# Patient Record
Sex: Female | Born: 1945
Health system: Southern US, Community
[De-identification: ages and names within clinical notes are randomized; demographics above are authoritative.]

## PROBLEM LIST (undated history)

## (undated) DIAGNOSIS — M8580 Other specified disorders of bone density and structure, unspecified site: Secondary | ICD-10-CM

## (undated) DIAGNOSIS — I2510 Atherosclerotic heart disease of native coronary artery without angina pectoris: Secondary | ICD-10-CM

## (undated) DIAGNOSIS — I10 Essential (primary) hypertension: Secondary | ICD-10-CM

## (undated) DIAGNOSIS — E785 Hyperlipidemia, unspecified: Secondary | ICD-10-CM

## (undated) DIAGNOSIS — IMO0001 Reserved for inherently not codable concepts without codable children: Secondary | ICD-10-CM

## (undated) DIAGNOSIS — R896 Abnormal cytological findings in specimens from other organs, systems and tissues: Secondary | ICD-10-CM

## (undated) DIAGNOSIS — IMO0002 Reserved for concepts with insufficient information to code with codable children: Secondary | ICD-10-CM

## (undated) DIAGNOSIS — I219 Acute myocardial infarction, unspecified: Secondary | ICD-10-CM

## (undated) DIAGNOSIS — Q600 Renal agenesis, unilateral: Secondary | ICD-10-CM

## (undated) DIAGNOSIS — T7840XA Allergy, unspecified, initial encounter: Secondary | ICD-10-CM

## (undated) HISTORY — DX: Reserved for concepts with insufficient information to code with codable children: IMO0002

## (undated) HISTORY — DX: Renal agenesis, unilateral: Q60.0

## (undated) HISTORY — PX: OTHER SURGICAL HISTORY: SHX169

## (undated) HISTORY — DX: Essential (primary) hypertension: I10

## (undated) HISTORY — PX: CORONARY ANGIOPLASTY: SHX604

## (undated) HISTORY — DX: Reserved for inherently not codable concepts without codable children: IMO0001

## (undated) HISTORY — DX: Allergy, unspecified, initial encounter: T78.40XA

## (undated) HISTORY — DX: Acute myocardial infarction, unspecified: I21.9

## (undated) HISTORY — PX: CARDIAC CATHETERIZATION: SHX172

## (undated) HISTORY — DX: Abnormal cytological findings in specimens from other organs, systems and tissues: R89.6

## (undated) HISTORY — DX: Other specified disorders of bone density and structure, unspecified site: M85.80

## (undated) HISTORY — DX: Hyperlipidemia, unspecified: E78.5

---

## 1999-01-12 ENCOUNTER — Other Ambulatory Visit: Admission: RE | Admit: 1999-01-12 | Discharge: 1999-01-12 | Payer: Self-pay | Admitting: Obstetrics and Gynecology

## 2000-05-27 HISTORY — PX: CORONARY ANGIOPLASTY WITH STENT PLACEMENT: SHX49

## 2000-05-27 HISTORY — PX: OTHER SURGICAL HISTORY: SHX169

## 2000-12-04 ENCOUNTER — Encounter: Payer: Self-pay | Admitting: Emergency Medicine

## 2000-12-04 ENCOUNTER — Inpatient Hospital Stay (HOSPITAL_COMMUNITY): Admission: AD | Admit: 2000-12-04 | Discharge: 2000-12-07 | Payer: Self-pay | Admitting: Cardiology

## 2000-12-04 ENCOUNTER — Encounter: Admission: RE | Admit: 2000-12-04 | Discharge: 2000-12-05 | Payer: Self-pay | Admitting: Cardiology

## 2001-01-14 ENCOUNTER — Other Ambulatory Visit: Admission: RE | Admit: 2001-01-14 | Discharge: 2001-01-14 | Payer: Self-pay | Admitting: Obstetrics and Gynecology

## 2002-04-29 ENCOUNTER — Other Ambulatory Visit: Admission: RE | Admit: 2002-04-29 | Discharge: 2002-04-29 | Payer: Self-pay | Admitting: Obstetrics and Gynecology

## 2004-06-05 ENCOUNTER — Ambulatory Visit: Payer: Self-pay | Admitting: *Deleted

## 2004-08-01 ENCOUNTER — Ambulatory Visit: Payer: Self-pay | Admitting: *Deleted

## 2005-01-07 ENCOUNTER — Ambulatory Visit: Payer: Self-pay | Admitting: Internal Medicine

## 2005-01-15 ENCOUNTER — Ambulatory Visit: Payer: Self-pay | Admitting: Internal Medicine

## 2005-01-21 ENCOUNTER — Ambulatory Visit: Payer: Self-pay | Admitting: Internal Medicine

## 2005-02-01 ENCOUNTER — Encounter: Payer: Self-pay | Admitting: Internal Medicine

## 2005-02-01 ENCOUNTER — Ambulatory Visit: Payer: Self-pay

## 2005-02-13 ENCOUNTER — Ambulatory Visit: Payer: Self-pay | Admitting: Internal Medicine

## 2005-02-25 ENCOUNTER — Ambulatory Visit: Payer: Self-pay | Admitting: Internal Medicine

## 2005-03-07 ENCOUNTER — Ambulatory Visit: Payer: Self-pay | Admitting: Internal Medicine

## 2005-03-20 ENCOUNTER — Encounter (INDEPENDENT_AMBULATORY_CARE_PROVIDER_SITE_OTHER): Payer: Self-pay | Admitting: Specialist

## 2005-03-20 ENCOUNTER — Ambulatory Visit: Payer: Self-pay | Admitting: Internal Medicine

## 2005-03-28 ENCOUNTER — Ambulatory Visit: Payer: Self-pay | Admitting: Internal Medicine

## 2005-06-13 ENCOUNTER — Ambulatory Visit: Payer: Self-pay | Admitting: Internal Medicine

## 2005-10-30 ENCOUNTER — Ambulatory Visit: Payer: Self-pay | Admitting: Internal Medicine

## 2006-03-25 ENCOUNTER — Ambulatory Visit: Payer: Self-pay | Admitting: Internal Medicine

## 2006-03-25 LAB — CONVERTED CEMR LAB
BUN: 13 mg/dL (ref 6–23)
CO2: 31 meq/L (ref 19–32)
Calcium: 9.5 mg/dL (ref 8.4–10.5)
Chloride: 103 meq/L (ref 96–112)
Creatinine, Ser: 0.9 mg/dL (ref 0.4–1.2)
GFR calc non Af Amer: 68 mL/min
Glomerular Filtration Rate, Af Am: 82 mL/min/{1.73_m2}
Glucose, Bld: 121 mg/dL — ABNORMAL HIGH (ref 70–99)
Hgb A1c MFr Bld: 6.3 % — ABNORMAL HIGH (ref 4.6–6.0)
Potassium: 4.2 meq/L (ref 3.5–5.1)
Sodium: 141 meq/L (ref 135–145)

## 2006-03-27 ENCOUNTER — Ambulatory Visit: Payer: Self-pay | Admitting: Internal Medicine

## 2007-04-15 DIAGNOSIS — E785 Hyperlipidemia, unspecified: Secondary | ICD-10-CM | POA: Insufficient documentation

## 2007-04-15 DIAGNOSIS — Z87891 Personal history of nicotine dependence: Secondary | ICD-10-CM | POA: Insufficient documentation

## 2007-04-15 DIAGNOSIS — R7309 Other abnormal glucose: Secondary | ICD-10-CM | POA: Insufficient documentation

## 2007-04-15 DIAGNOSIS — I2510 Atherosclerotic heart disease of native coronary artery without angina pectoris: Secondary | ICD-10-CM | POA: Insufficient documentation

## 2007-04-15 DIAGNOSIS — I10 Essential (primary) hypertension: Secondary | ICD-10-CM | POA: Insufficient documentation

## 2007-05-20 ENCOUNTER — Encounter: Payer: Self-pay | Admitting: Internal Medicine

## 2007-06-05 ENCOUNTER — Telehealth: Payer: Self-pay | Admitting: Internal Medicine

## 2007-06-17 ENCOUNTER — Telehealth: Payer: Self-pay | Admitting: Internal Medicine

## 2007-07-14 ENCOUNTER — Ambulatory Visit: Payer: Self-pay | Admitting: Internal Medicine

## 2007-07-14 DIAGNOSIS — Q602 Renal agenesis, unspecified: Secondary | ICD-10-CM | POA: Insufficient documentation

## 2007-07-14 DIAGNOSIS — Q605 Renal hypoplasia, unspecified: Secondary | ICD-10-CM

## 2007-07-14 DIAGNOSIS — I252 Old myocardial infarction: Secondary | ICD-10-CM | POA: Insufficient documentation

## 2007-07-15 ENCOUNTER — Telehealth: Payer: Self-pay | Admitting: Internal Medicine

## 2007-07-16 ENCOUNTER — Encounter: Payer: Self-pay | Admitting: Internal Medicine

## 2007-09-01 ENCOUNTER — Ambulatory Visit: Payer: Self-pay | Admitting: Internal Medicine

## 2007-09-01 DIAGNOSIS — M899 Disorder of bone, unspecified: Secondary | ICD-10-CM | POA: Insufficient documentation

## 2007-09-01 DIAGNOSIS — M949 Disorder of cartilage, unspecified: Secondary | ICD-10-CM

## 2007-09-01 LAB — CONVERTED CEMR LAB: Vit D, 1,25-Dihydroxy: 36 (ref 30–89)

## 2007-09-05 LAB — CONVERTED CEMR LAB
BUN: 16 mg/dL (ref 6–23)
CO2: 28 meq/L (ref 19–32)
Calcium: 9.3 mg/dL (ref 8.4–10.5)
Chloride: 106 meq/L (ref 96–112)
Cholesterol: 175 mg/dL (ref 0–200)
Creatinine, Ser: 0.8 mg/dL (ref 0.4–1.2)
GFR calc Af Amer: 94 mL/min
GFR calc non Af Amer: 78 mL/min
Glucose, Bld: 122 mg/dL — ABNORMAL HIGH (ref 70–99)
HDL: 28.3 mg/dL — ABNORMAL LOW (ref >39.0)
Hgb A1c MFr Bld: 7 % — ABNORMAL HIGH (ref 4.6–6.0)
LDL Cholesterol: 123 mg/dL — ABNORMAL HIGH (ref 0–99)
Potassium: 4.4 meq/L (ref 3.5–5.1)
Sodium: 141 meq/L (ref 135–145)
TSH: 2.84 microintl units/mL (ref 0.35–5.50)
Total CHOL/HDL Ratio: 6.2
Triglycerides: 121 mg/dL (ref 0–149)
VLDL: 24 mg/dL (ref 0–40)

## 2007-09-14 ENCOUNTER — Encounter: Payer: Self-pay | Admitting: Internal Medicine

## 2007-09-14 ENCOUNTER — Other Ambulatory Visit: Admission: RE | Admit: 2007-09-14 | Discharge: 2007-09-14 | Payer: Self-pay | Admitting: Internal Medicine

## 2007-09-14 ENCOUNTER — Ambulatory Visit: Payer: Self-pay | Admitting: Internal Medicine

## 2007-09-14 LAB — CONVERTED CEMR LAB
Cholesterol, target level: 200 mg/dL
HDL goal, serum: 40 mg/dL
LDL Goal: 100 mg/dL

## 2007-12-03 ENCOUNTER — Ambulatory Visit: Payer: Self-pay | Admitting: Internal Medicine

## 2007-12-03 LAB — CONVERTED CEMR LAB
ALT: 65 units/L — ABNORMAL HIGH (ref 0–35)
AST: 51 units/L — ABNORMAL HIGH (ref 0–37)
Albumin: 3.9 g/dL (ref 3.5–5.2)
Alkaline Phosphatase: 68 units/L (ref 39–117)
Bilirubin, Direct: 0.1 mg/dL (ref 0.0–0.3)
Cholesterol: 166 mg/dL (ref 0–200)
HDL: 31.8 mg/dL — ABNORMAL LOW (ref >39.0)
Hgb A1c MFr Bld: 6.5 % — ABNORMAL HIGH (ref 4.6–6.0)
LDL Cholesterol: 107 mg/dL — ABNORMAL HIGH (ref 0–99)
Total Bilirubin: 1.2 mg/dL (ref 0.3–1.2)
Total CHOL/HDL Ratio: 5.2
Total Protein: 7 g/dL (ref 6.0–8.3)
Triglycerides: 138 mg/dL (ref 0–149)
VLDL: 28 mg/dL (ref 0–40)

## 2007-12-14 ENCOUNTER — Ambulatory Visit: Payer: Self-pay | Admitting: Internal Medicine

## 2007-12-22 ENCOUNTER — Telehealth: Payer: Self-pay | Admitting: Internal Medicine

## 2007-12-23 ENCOUNTER — Ambulatory Visit (HOSPITAL_COMMUNITY): Admission: RE | Admit: 2007-12-23 | Discharge: 2007-12-23 | Payer: Self-pay | Admitting: Internal Medicine

## 2008-02-05 ENCOUNTER — Telehealth: Payer: Self-pay | Admitting: Internal Medicine

## 2008-02-05 ENCOUNTER — Ambulatory Visit: Payer: Self-pay | Admitting: Internal Medicine

## 2008-02-05 LAB — CONVERTED CEMR LAB
ALT: 51 units/L — ABNORMAL HIGH (ref 0–35)
AST: 33 units/L (ref 0–37)
Albumin: 3.9 g/dL (ref 3.5–5.2)
Alkaline Phosphatase: 64 units/L (ref 39–117)
Bilirubin, Direct: 0.1 mg/dL (ref 0.0–0.3)
Cholesterol: 141 mg/dL (ref 0–200)
HDL: 34.2 mg/dL — ABNORMAL LOW (ref >39.0)
LDL Cholesterol: 80 mg/dL (ref 0–99)
Total Bilirubin: 0.8 mg/dL (ref 0.3–1.2)
Total CHOL/HDL Ratio: 4.1
Total Protein: 7.4 g/dL (ref 6.0–8.3)
Triglycerides: 136 mg/dL (ref 0–149)
VLDL: 27 mg/dL (ref 0–40)

## 2008-02-16 ENCOUNTER — Ambulatory Visit: Payer: Self-pay | Admitting: Internal Medicine

## 2008-02-16 LAB — CONVERTED CEMR LAB: Blood Glucose, Fingerstick: 113

## 2008-07-19 ENCOUNTER — Encounter: Payer: Self-pay | Admitting: Internal Medicine

## 2008-08-15 ENCOUNTER — Ambulatory Visit: Payer: Self-pay | Admitting: Internal Medicine

## 2008-08-15 LAB — CONVERTED CEMR LAB
AST: 29 units/L (ref 0–37)
Hgb A1c MFr Bld: 6.2 % (ref 4.6–6.5)
Total Bilirubin: 1.2 mg/dL (ref 0.3–1.2)

## 2008-08-23 ENCOUNTER — Ambulatory Visit: Payer: Self-pay | Admitting: Internal Medicine

## 2008-08-23 DIAGNOSIS — E669 Obesity, unspecified: Secondary | ICD-10-CM | POA: Insufficient documentation

## 2008-08-23 LAB — CONVERTED CEMR LAB: Blood Glucose, Fingerstick: 110

## 2008-11-14 ENCOUNTER — Ambulatory Visit: Payer: Self-pay | Admitting: Internal Medicine

## 2008-11-15 ENCOUNTER — Telehealth: Payer: Self-pay | Admitting: Internal Medicine

## 2008-11-15 LAB — CONVERTED CEMR LAB
ALT: 18 units/L (ref 0–35)
AST: 21 units/L (ref 0–37)
Alkaline Phosphatase: 75 units/L (ref 39–117)
BUN: 23 mg/dL (ref 6–23)
Bilirubin, Direct: 0.1 mg/dL (ref 0.0–0.3)
Chloride: 103 meq/L (ref 96–112)
Hgb A1c MFr Bld: 6 % (ref 4.6–6.5)
Potassium: 4.8 meq/L (ref 3.5–5.1)
TSH: 1.39 microintl units/mL (ref 0.35–5.50)
Total Bilirubin: 1.2 mg/dL (ref 0.3–1.2)

## 2008-12-13 ENCOUNTER — Ambulatory Visit: Payer: Self-pay | Admitting: Internal Medicine

## 2008-12-13 DIAGNOSIS — M109 Gout, unspecified: Secondary | ICD-10-CM | POA: Insufficient documentation

## 2008-12-13 LAB — CONVERTED CEMR LAB: Blood Glucose, Fingerstick: 159

## 2009-01-31 ENCOUNTER — Telehealth: Payer: Self-pay | Admitting: *Deleted

## 2009-03-08 ENCOUNTER — Ambulatory Visit: Payer: Self-pay | Admitting: Internal Medicine

## 2009-03-08 LAB — CONVERTED CEMR LAB
ALT: 16 units/L (ref 0–35)
AST: 20 units/L (ref 0–37)
Albumin: 3.9 g/dL (ref 3.5–5.2)
BUN: 11 mg/dL (ref 6–23)
Bilirubin Urine: NEGATIVE
Chloride: 101 meq/L (ref 96–112)
Cholesterol: 153 mg/dL (ref 0–200)
Eosinophils Relative: 3.7 % (ref 0.0–5.0)
Glucose, Bld: 93 mg/dL (ref 70–99)
Glucose, Urine, Semiquant: NEGATIVE
HCT: 51.1 % — ABNORMAL HIGH (ref 36.0–46.0)
Hemoglobin: 16.8 g/dL — ABNORMAL HIGH (ref 12.0–15.0)
Lymphs Abs: 3 10*3/uL (ref 0.7–4.0)
MCV: 89.1 fL (ref 78.0–100.0)
Monocytes Absolute: 0.6 10*3/uL (ref 0.1–1.0)
Neutro Abs: 3.4 10*3/uL (ref 1.4–7.7)
Platelets: 235 10*3/uL (ref 150.0–400.0)
Potassium: 4.6 meq/L (ref 3.5–5.1)
Sodium: 144 meq/L (ref 135–145)
TSH: 2.63 microintl units/mL (ref 0.35–5.50)
Total Bilirubin: 0.9 mg/dL (ref 0.3–1.2)
Total Protein: 7.2 g/dL (ref 6.0–8.3)
WBC: 7.4 10*3/uL (ref 4.5–10.5)
pH: 5

## 2009-03-15 ENCOUNTER — Ambulatory Visit: Payer: Self-pay | Admitting: Internal Medicine

## 2009-03-15 ENCOUNTER — Other Ambulatory Visit: Admission: RE | Admit: 2009-03-15 | Discharge: 2009-03-15 | Payer: Self-pay | Admitting: Internal Medicine

## 2009-03-15 ENCOUNTER — Encounter: Payer: Self-pay | Admitting: Internal Medicine

## 2009-03-15 DIAGNOSIS — R7989 Other specified abnormal findings of blood chemistry: Secondary | ICD-10-CM | POA: Insufficient documentation

## 2009-05-18 ENCOUNTER — Telehealth: Payer: Self-pay | Admitting: Internal Medicine

## 2009-05-18 ENCOUNTER — Ambulatory Visit: Payer: Self-pay | Admitting: Internal Medicine

## 2009-05-18 DIAGNOSIS — J069 Acute upper respiratory infection, unspecified: Secondary | ICD-10-CM | POA: Insufficient documentation

## 2009-05-27 HISTORY — PX: HEMORRHOID SURGERY: SHX153

## 2009-06-23 ENCOUNTER — Encounter: Payer: Self-pay | Admitting: Internal Medicine

## 2009-08-30 ENCOUNTER — Encounter: Payer: Self-pay | Admitting: Internal Medicine

## 2009-09-28 ENCOUNTER — Ambulatory Visit: Payer: Self-pay | Admitting: Internal Medicine

## 2009-09-28 LAB — CONVERTED CEMR LAB
ALT: 12 units/L (ref 0–35)
AST: 16 units/L (ref 0–37)
Albumin: 3.9 g/dL (ref 3.5–5.2)
Alkaline Phosphatase: 84 units/L (ref 39–117)
Bilirubin, Direct: 0.1 mg/dL (ref 0.0–0.3)
Cholesterol: 134 mg/dL (ref 0–200)
Total Bilirubin: 0.6 mg/dL (ref 0.3–1.2)
VLDL: 20.4 mg/dL (ref 0.0–40.0)

## 2009-10-03 ENCOUNTER — Ambulatory Visit: Payer: Self-pay | Admitting: Internal Medicine

## 2009-11-06 ENCOUNTER — Encounter: Payer: Self-pay | Admitting: Internal Medicine

## 2010-01-19 ENCOUNTER — Encounter: Payer: Self-pay | Admitting: Internal Medicine

## 2010-02-21 ENCOUNTER — Encounter: Payer: Self-pay | Admitting: Internal Medicine

## 2010-03-13 ENCOUNTER — Ambulatory Visit: Payer: Self-pay | Admitting: Internal Medicine

## 2010-03-13 LAB — CONVERTED CEMR LAB
ALT: 13 units/L (ref 0–35)
AST: 17 units/L (ref 0–37)
BUN: 20 mg/dL (ref 6–23)
Basophils Relative: 0.6 % (ref 0.0–3.0)
Bilirubin Urine: NEGATIVE
Bilirubin, Direct: 0.1 mg/dL (ref 0.0–0.3)
Chloride: 106 meq/L (ref 96–112)
Eosinophils Relative: 2.4 % (ref 0.0–5.0)
GFR calc non Af Amer: 97.71 mL/min (ref >60)
HCT: 45.3 % (ref 36.0–46.0)
Ketones, ur: NEGATIVE mg/dL
LDL Cholesterol: 60 mg/dL (ref 0–99)
Lymphs Abs: 3.3 10*3/uL (ref 0.7–4.0)
MCV: 89.9 fL (ref 78.0–100.0)
Monocytes Absolute: 0.6 10*3/uL (ref 0.1–1.0)
Monocytes Relative: 9.1 % (ref 3.0–12.0)
Platelets: 238 10*3/uL (ref 150.0–400.0)
Potassium: 5.5 meq/L — ABNORMAL HIGH (ref 3.5–5.1)
RBC: 5.04 M/uL (ref 3.87–5.11)
Sodium: 145 meq/L (ref 135–145)
Total Bilirubin: 1 mg/dL (ref 0.3–1.2)
Total CHOL/HDL Ratio: 3
Total Protein, Urine: NEGATIVE mg/dL
Total Protein: 7.2 g/dL (ref 6.0–8.3)
VLDL: 19.2 mg/dL (ref 0.0–40.0)
WBC: 7.2 10*3/uL (ref 4.5–10.5)
pH: 5.5 (ref 5.0–8.0)

## 2010-03-20 ENCOUNTER — Ambulatory Visit: Payer: Self-pay | Admitting: Internal Medicine

## 2010-05-08 ENCOUNTER — Telehealth: Payer: Self-pay | Admitting: *Deleted

## 2010-05-11 ENCOUNTER — Ambulatory Visit: Payer: Self-pay | Admitting: Internal Medicine

## 2010-05-11 DIAGNOSIS — J209 Acute bronchitis, unspecified: Secondary | ICD-10-CM | POA: Insufficient documentation

## 2010-05-16 ENCOUNTER — Telehealth: Payer: Self-pay | Admitting: *Deleted

## 2010-06-06 ENCOUNTER — Telehealth: Payer: Self-pay | Admitting: *Deleted

## 2010-06-17 ENCOUNTER — Encounter: Payer: Self-pay | Admitting: Internal Medicine

## 2010-06-26 NOTE — Assessment & Plan Note (Signed)
Summary: follow up on labs/ssc/pt rsc/cjr   Vital Signs:  Patient profile:   65 year old female Menstrual status:  postmenopausal Weight:      155 pounds Pulse rate:   72 / minute BP sitting:   120 / 60  (right arm) Cuff size:   regular  Vitals Entered By: Sherron Monday, CMA (AAMA) (Oct 03, 2009 8:43 AM) CC: Follow-up visit on labs, Hypertension Management   History of Present Illness: Rebecca Mcintyre comesin comes in today  for above  BP:  no problems with meds  BG:  lifestyle intervention and metformin and no se of meds  LIPIDS:No se of crestor  CAD:  no cp sob   OBGYNE    : Folowing  Hemorrhoid   :  Had surgery  and better  "I feel pretty good"   Hypertension History:      She denies headache, chest pain, palpitations, dyspnea with exertion, orthopnea, PND, peripheral edema, visual symptoms, neurologic problems, syncope, and side effects from treatment.  She notes no problems with any antihypertensive medication side effects.        Positive major cardiovascular risk factors include female age 57 years old or older, hyperlipidemia, and hypertension.  Negative major cardiovascular risk factors include non-tobacco-user status.        Positive history for target organ damage include ASHD (either angina/prior MI/prior CABG).  Further assessment for target organ damage reveals no history of stroke/TIA.     Preventive Screening-Counseling & Management  Alcohol-Tobacco     Alcohol drinks/day: <1     Alcohol type: wine     Smoking Status: quit > 6 months     Year Quit: 2002  Caffeine-Diet-Exercise     Caffeine use/day: 0     Does Patient Exercise: yes     Type of exercise: walking, cardio, strenth     Times/week: 6  Current Medications (verified): 1)  Adprin B 325 Mg  Tabs (Aspirin Buf(Cacarb-Mgcarb-Mgo)) 2)  Nitroquick 0.4 Mg  Subl (Nitroglycerin) .... Sl As Needed 3)  Metoprolol Tartrate 25 Mg  Tabs (Metoprolol Tartrate) .... 1/2  By Mouth Two Times A Day 4)  Vitamin D  400 Unit  Tabs (Cholecalciferol) 5)  Multivitamins   Tabs (Multiple Vitamin) 6)  Fish Oil   Oil (Fish Oil) 7)  Metformin Hcl 500 Mg  Tabs (Metformin Hcl) .[name].. 1 By Mouth Two Times A Day 8)  Crestor 20 Mg Tabs (Rosuvastatin Calcium) .[name].. 1 By Mouth Once Daily 9)  Hydrocodone-Homatropine 5-1.5 Mg/75m Syrp (Hydrocodone-Homatropine) ..[name]. 1 Teaspoon Every 6 Hours As Needed For Cough  Allergies (verified): 1)  Lipitor  Past History:  Past medical, surgical, family and social histories (including risk factors) reviewed, and no changes noted (except as noted below).  Past Medical History: Myocardial infarction anterior stemi , hx of  2002   stent" in front artery of her heart."95% LAD with intervention Last  stress test 2006   nl lv function hypertensive response  subendocardial scar  Hypertension Hyperlipidemia Gout Osteopenia Solitary kidney   Past Surgical History: Right oophorectomy with salpingectomy benign growth Dr RJoneen Caraway LAD Stent   angioplasty 2002 Hemorrhoidectomy 2011  Past History:  Care Management: GYNE: Cards :  Pulsipher in the past  SURGEON  Family History: Reviewed history from 02/16/2008 and no changes required. Family History Breast cancer 1st degree relative <50-mother 433   Family History of Cardiovascular disorder- Brother Heart Attack 456died  twin  had MI  recently  ?  elevated BG niece 55 died of breast cancer.   Family History Lung cancer father 79  smoker      Social History: Reviewed history from 02/16/2008 and no changes required. Married Former Smoker  37 years stopped at MI Alcohol use-yes 1-2 per month  Drug use-no Regular exercise-yes 6-7 hours sleep.  Divorced Occupation:realtor        Review of Systems  The patient denies anorexia, fever, weight loss, weight gain, vision loss, hoarseness, chest pain, syncope, dyspnea on exertion, peripheral edema, abdominal pain, melena, hematochezia, severe indigestion/heartburn, muscle weakness,  suspicious skin lesions, transient blindness, difficulty walking, depression, abnormal bleeding, enlarged lymph nodes, and angioedema.         has a cough   after working in yard  no cp sob or wheeze   Physical Exam  General:  Well-developed,well-nourished,in no acute distress; alert,appropriate and cooperative throughout examination Head:  normocephalic and atraumatic.   Eyes:  vision grossly intact, pupils equal, and pupils round.   Ears:  R ear normal and L ear normal.   Mouth:  pharynx pink and moist.   Neck:  No deformities, masses, or tenderness noted.thyroid palpable Lungs:  Normal respiratory effort, chest expands symmetrically. Lungs are clear to auscultation, no crackles or wheezes. Heart:  Normal rate and regular rhythm. S1 and S2 normal without gallop, murmur, click, rub or other extra sounds. Abdomen:  Bowel sounds positive,abdomen soft and non-tender without masses, organomegaly or  noted. Msk:  no joint warmth and no redness over joints.   Pulses:  pulses intact without delay   Extremities:  trace left pedal edema and trace right pedal edema.   no clubbing cyanosis  Neurologic:  alert & oriented X3, strength normal in all extremities, and gait normal.   Skin:  turgor normal and color normal.  peeling from sun on back no unusal lesions Cervical Nodes:  No lymphadenopathy noted Psych:  Normal eye contact, appropriate affect. Cognition appears normal.    Impression & Recommendations:  Problem # 1:  HYPERTENSION (ICD-401.9) Assessment Unchanged  Her updated medication list for this problem includes:    Metoprolol Tartrate 25 Mg Tabs (Metoprolol tartrate) .[name]... 1/2  by mouth two times a day  BP today: 120/60 Prior BP: 116/72 (05/18/2009)  Prior 10 Yr Risk Heart Disease: N/A (07/14/2007)  Labs Reviewed: K+: 4.6 (03/08/2009) Creat: : 0.7 (03/08/2009)   Chol: 134 (09/28/2009)   HDL: 47.40 (09/28/2009)   LDL: 66 (09/28/2009)   TG: 102.0 (09/28/2009)  Problem # 2:   HYPERLIPIDEMIA (ICD-272.4)  Her updated medication list for this problem includes:    Crestor 20 Mg Tabs (Rosuvastatin calcium) .[name]... 1 by mouth once daily  Labs Reviewed: SGOT: 16 (09/28/2009)   SGPT: 12 (09/28/2009)  Lipid Goals: Chol Goal: 200 (09/14/2007)   HDL Goal: 40 (09/14/2007)   LDL Goal: 100 (09/14/2007)   TG Goal: 150 (09/14/2007)  Prior 10 Yr Risk Heart Disease: N/A (07/14/2007)   HDL:47.40 (09/28/2009), 34.80 (03/08/2009)  LDL:66 (09/28/2009), 91 (03/08/2009)  Chol:134 (09/28/2009), 153 (03/08/2009)  Trig:102.0 (09/28/2009), 137.0 (03/08/2009)  Problem # 3:  OTHER ABNORMAL GLUCOSE (ICD-790.29) Assessment: Improved  Her updated medication list for this problem includes:    Metformin Hcl 500 Mg Tabs (Metformin hcl) .[name]... 1 by mouth two times a day  Labs Reviewed: Creat: 0.7 (03/08/2009)     Problem # 4:  CORONARY ARTERY DISEASE (ICD-414.00) quiescent.     Problem # 5:  OBESITY (ICD-278.00) has lost weight with lifestyle intervention and  now [facility]  is 73!!!   to continue .   Complete Medication List: 1)  Adprin B 325 Mg Tabs (Aspirin buf(cacarb-mgcarb-mgo)) 2)  Nitroquick 0.4 Mg Subl (Nitroglycerin) .... Sl as needed 3)  Metoprolol Tartrate 25 Mg Tabs (Metoprolol tartrate) .... 1/2  by mouth two times a day 4)  Vitamin D 400 Unit Tabs (Cholecalciferol) 5)  Multivitamins Tabs (Multiple vitamin) 6)  Fish Oil Oil (Fish oil) 7)  Metformin Hcl 500 Mg Tabs (Metformin hcl) .[name].. 1 by mouth two times a day 8)  Crestor 20 Mg Tabs (Rosuvastatin calcium) .[name].. 1 by mouth once daily  Hypertension Assessment/Plan:      The patient's hypertensive risk group is category C: Target organ damage and/or diabetes.  Today's blood pressure is 120/60.  Her blood pressure goal is < 140/90.  Patient Instructions: 1)  ontinue  healthy lifestyle . 2)  CPX  in the fall with labs and  Hg a1c .   dx 790.91 Prescriptions: CRESTOR 20 MG TABS (ROSUVASTATIN CALCIUM) 1 by mouth once daily  #30 x  6   Entered and Authorized by:   Burnis Medin MD   Signed by:   Burnis Medin MD on 10/03/2009   Method used:   Electronically to        [facility] [address] (retail)       [place], [place]  [address]       Ph: [phone]       Fax: [phone]   RxID:   [id number] METFORMIN HCL 500 MG  TABS (METFORMIN HCL) 1 by mouth two times a day  #60 Not Speci x 12   Entered and Authorized by:   Burnis Medin MD   Signed by:   Burnis Medin MD on 10/03/2009   Method used:   Electronically to        [facility] [id number] (retail)       [place], [place]  [address]       Ph: [phone]       Fax: [phone]   RxID:   [id number] METOPROLOL TARTRATE 25 MG  TABS (METOPROLOL TARTRATE) 1/2  by mouth two times a day  #90 Tablet x 12   Entered and Authorized by:   Burnis Medin MD   Signed by:   Burnis Medin MD on 10/03/2009   Method used:   Electronically to        [facility] [address] (retail)       [place], [place]  [address]       Ph: [phone]       Fax: [phone]   RxID:   [id number]  review of record  last  stress test  2006 .   will address at follow up

## 2010-06-26 NOTE — Letter (Signed)
Summary: Letter from patient  Letter from patient   Imported By: Bubba Hales 03/05/2010 08:02:04  _____________________________________________________________________  External Attachment:    Type:   Image     Comment:   External Document

## 2010-06-26 NOTE — Assessment & Plan Note (Signed)
Summary: cpx---pap//ccm   Vital Signs:  Patient profile:   65 year old female Menstrual status:  postmenopausal Height:      64 inches Weight:      150 pounds BMI:     25.84 Pulse rate:   78 / minute BP sitting:   120 / 80  (right arm) Cuff size:   regular  Vitals Entered By: Sherron Monday, CMA (AAMA) (March 20, 2010 10:08 AM)  Nutrition Counseling: Patient's BMI is greater than 25 and therefore counseled on weight management options. CC: CPX- Pt has a gyn who does paps.   History of Present Illness: Rebecca Mcintyre comes in today  for check up.  Since her lst visit she has been well.  She has had  Hot flushes have come back  some.   CAD: no Chest sob  exercising well.   NO need for nitro but to get refill .  last stress test about 4 years ago. holdong off cause of cost on high deductable insurance  Had a left foot stinging  when in the water at [employer]   . no more pain but  still =has a knot . Had hemorrhoidectomy and  did well.    Preventive Care Screening  Pap Smear:    Date:  09/24/2009    Results:  normal   Mammogram:    Date:  08/30/2009    Results:  normal   Prior Values:    Pap Smear:  **ATYPICAL SQUAMOUS CELLS OF UNDETERMINED SIGNIFICANCE (03/15/2009)    Mammogram:  Done (02/26/2006)    Colonoscopy:  Done (03/20/2005)    Last Tetanus Booster:  Tdap (09/14/2007)    Last Flu Shot:  Fluvax 3+ (03/15/2009)   Preventive Screening-Counseling & Management  Alcohol-Tobacco     Alcohol drinks/day: <1     Alcohol type: wine     Smoking Status: quit > 6 months     Year Quit: 2002  Caffeine-Diet-Exercise     Caffeine use/day: 0     Does Patient Exercise: yes     Type of exercise: walking, cardio, strenth     Times/week: 6  Hep-HIV-STD-Contraception     Dental Visit-last 6 months yes     Sun Exposure-Excessive: no  Safety-Violence-Falls     Seat Belt Use: yes     Firearms in the Home: no firearms in the home     Smoke Detectors: yes  Current  Medications (verified): 1)  Adprin B 325 Mg  Tabs (Aspirin Buf(Cacarb-Mgcarb-Mgo)) 2)  Nitroquick 0.4 Mg  Subl (Nitroglycerin) .... Sl As Needed 3)  Metoprolol Tartrate 25 Mg  Tabs (Metoprolol Tartrate) .... 1/2  By Mouth Two Times A Day 4)  Vitamin D 400 Unit  Tabs (Cholecalciferol) 5)  Multivitamins   Tabs (Multiple Vitamin) 6)  Fish Oil   Oil (Fish Oil) 7)  Metformin Hcl 500 Mg  Tabs (Metformin Hcl) .[name].. 1 By Mouth Two Times A Day 8)  Crestor 20 Mg Tabs (Rosuvastatin Calcium) .[name].. 1 By Mouth Once Daily  Allergies (verified): 1)  Lipitor  Past History:  Past medical, surgical, family and social histories (including risk factors) reviewed, and no changes noted (except as noted below).  Past Medical History: Myocardial infarction anterior stemi , hx of  2002   stent" in front artery of her heart."95% LAD with intervention Last  stress test 2006   nl lv function hypertensive response  subendocardial scar  Hypertension Hyperlipidemia Gout Osteopenia Solitary kidney  ASCUS pap  had repeat x  2   normal with Dr Marvel Plan.  2011  Past Surgical History: Reviewed history from 10/03/2009 and no changes required. Right oophorectomy with salpingectomy benign growth Dr Joneen Caraway  LAD Stent   angioplasty 2002 Hemorrhoidectomy 2011  Past History:  Care Management: GYNE: [place] Cards :  Pulsipher in the past- Pt states that she hasn't seen Cards since 2006. SURGEON  weatherly EYE:  Family History: Reviewed history from 02/16/2008 and no changes required. Family History Breast cancer 1st degree relative <50-mother 54    Family History of Cardiovascular disorder- Brother Heart Attack 58 died  twin  had MI  recently  ? elevated BG niece 75 died of breast cancer.   Family History Lung cancer father 52  smoker  Brother  dx Psychiatrist olon cancer  age 44 this year      Social History: Reviewed history from 10/03/2009 and no changes required. Married Former Smoker  37 years stopped at  MI Alcohol use-yes 1-2 per month  Drug use-no Regular exercise-yes 6-7 hours sleep.  Divorced Occupation:realtor        Review of Systems       eye check and was  ok.  slight decrease in hearing .  no cv piulm  gi gu signs   . no se of meds no bleeding  Physical Exam  General:  Well-developed,well-nourished,in no acute distress; alert,appropriate and cooperative throughout examination Head:  normocephalic and atraumatic.   Eyes:  vision grossly intact, pupils equal, pupils round, and pupils reactive to light.   Ears:  R ear normal, L ear normal, and no external deformities.   Nose:  no external deformity and no nasal discharge.   Mouth:  good dentition and pharynx pink and moist.   Neck:  No deformities, masses, or tenderness noted.thyroid palpable Breasts:  No mass, nodules, thickening, tenderness, bulging, retraction, inflamation, nipple discharge or skin changes noted.   Lungs:  Normal respiratory effort, chest expands symmetrically. Lungs are clear to auscultation, no crackles or wheezes. Heart:  Normal rate and regular rhythm. S1 and S2 normal without gallop, murmur, click, rub or other extra sounds.no lifts.   Abdomen:  Bowel sounds positive,abdomen soft and non-tender without masses, organomegaly or hernias noted. Genitalia:  per gyne  this past year and nl paps Msk:  no joint swelling, no joint warmth, no redness over joints, and no joint deformities.   Pulses:  pulses intact without delay   Extremities:  no clubbing cyanosis or edema  Neurologic:  alert & oriented X3, strength normal in all extremities, sensation intact to light touch, gait normal, and DTRs symmetrical and normal.   Skin:  turgor normal, color normal, no ecchymoses, no petechiae, and no purpura.   right foot  small corn like lesion and no redness and no tenderness Cervical Nodes:  No lymphadenopathy noted Axillary Nodes:  No palpable lymphadenopathy Inguinal Nodes:  No significant adenopathy Psych:  Normal  eye contact, appropriate affect. Cognition appears normal.    Impression & Recommendations:  Problem # 1:  PREVENTIVE HEALTH CARE (ICD-V70.0) Discussed nutrition,exercise,diet,healthy weight, vitamin D and calcium.   continue  hea;thy  m  lifestyle intervention . She has lost at least 70 pounds  and doing well over the last 2 years   brother older with colon cancer     need to be UTD with screening . will check into this and cost   Problem # 2:  HYPERLIPIDEMIA (ICD-272.4)  Her updated medication list for this problem includes:    Crestor  20 Mg Tabs (Rosuvastatin calcium) .[name]... 1 by mouth once daily  Labs Reviewed: SGOT: 17 (03/13/2010)   SGPT: 13 (03/13/2010)  Lipid Goals: Chol Goal: 200 (09/14/2007)   HDL Goal: 40 (09/14/2007)   LDL Goal: 100 (09/14/2007)   TG Goal: 150 (09/14/2007)  Prior 10 Yr Risk Heart Disease: N/A (07/14/2007)   HDL:41.20 (03/13/2010), 47.40 (09/28/2009)  LDL:60 (03/13/2010), 66 (09/28/2009)  Chol:120 (03/13/2010), 134 (09/28/2009)  Trig:96.0 (03/13/2010), 102.0 (09/28/2009)  Problem # 3:  CORONARY ARTERY DISEASE (ICD-414.00) no signs   Her updated medication list for this problem includes:    Adprin B 325 Mg Tabs (Aspirin buf(cacarb-mgcarb-mgo))    Nitroquick 0.4 Mg Subl (Nitroglycerin) ..... Sl as needed    Metoprolol Tartrate 25 Mg Tabs (Metoprolol tartrate) .[name]... 1/2  by mouth two times a day  Problem # 4:  MYOCARDIAL INFARCTION, HX OF (ICD-412) no recurrance  hodling off on stress test because of high deductable insurance .  disc  recheck for this  Her updated medication list for this problem includes:    Adprin B 325 Mg Tabs (Aspirin buf(cacarb-mgcarb-mgo))    Nitroquick 0.4 Mg Subl (Nitroglycerin) ..... Sl as needed    Metoprolol Tartrate 25 Mg Tabs (Metoprolol tartrate) .[name]... 1/2  by mouth two times a day  Problem # 5:  OTHER ABNORMAL GLUCOSE (ICD-790.29) Assessment: Unchanged a1c is 6.0 and stable  Her updated medication list for this problem  includes:    Metformin Hcl 500 Mg Tabs (Metformin hcl) .[name]... 1 by mouth two times a day  Labs Reviewed: Creat: 0.7 (03/13/2010)     Problem # 6:  ascus on pap  resolved as per pat report .  Complete Medication List: 1)  Adprin B 325 Mg Tabs (Aspirin buf(cacarb-mgcarb-mgo)) 2)  Nitroquick 0.4 Mg Subl (Nitroglycerin) .... Sl as needed 3)  Metoprolol Tartrate 25 Mg Tabs (Metoprolol tartrate) .... 1/2  by mouth two times a day 4)  Vitamin D 400 Unit Tabs (Cholecalciferol) 5)  Multivitamins Tabs (Multiple vitamin) 6)  Fish Oil Oil (Fish oil) 7)  Metformin Hcl 500 Mg Tabs (Metformin hcl) .[name].. 1 by mouth two times a day 8)  Crestor 20 Mg Tabs (Rosuvastatin calcium) .[name].. 1 by mouth once daily  Other Orders: Admin 1st Vaccine (30076) Flu Vaccine 50yr + ((22633  Patient Instructions: 1)  check into  screening  colonoscopy.   2)  continue healthy lifestyle intervention  3)  check hg a1c 12 mand cpx labs  in 12 months  and return office visit or as needed  Prescriptions: NITROQUICK 0.4 MG  SUBL (NITROGLYCERIN) sl as needed  #1 bottle x 3   Entered and Authorized by:   WBurnis MedinMD   Signed by:   WBurnis MedinMD on 03/20/2010   Method used:   Electronically to        [facility] [id number] (retail)       [place]  [address]      Ph: [phone]      Fax: [phone]  RxID:   [id number] 20 MG TABS (ROSUVASTATIN CALCIUM) 1 by mouth once daily  #30 x 12   Entered and Authorized by:   WBurnis MedinMD   Signed by:   WBurnis MedinMD on 03/20/2010   Method used:   Electronically to        [facility] [id number] (retail)  [place], [place]  [address]       Ph: [phone]       Fax: [phone]   RxID:   [id number]    Orders Added: 1)  Admin 1st Vaccine [90471] 2)  Flu Vaccine 27yr + [[33744]3)  Est. Patient 40-64 years [[51460]Flu Vaccine Consent Questions     Do you  have a history of severe allergic reactions to this vaccine? no    Any prior history of allergic reactions to egg and/or gelatin? no    Do you have a sensitivity to the preservative Thimersol? no    Do you have a past history of Guillan-Barre Syndrome? no    Do you currently have an acute febrile illness? no    Have you ever had a severe reaction to latex? no    Vaccine information given and explained to patient? yes    Are you currently pregnant? no    Lot Number:[id number]   Exp Date:11/24/2010   Site Given  Left Deltoid IM SSherron Monday CMA (AAMA)  March 20, 2010 10:13 AM        .lElana Alm

## 2010-06-26 NOTE — Letter (Signed)
Summary: [facility] Clinical Visit Summary  [facility] Clinical Visit Summary   Imported By: Laural Benes 06/28/2009 11:28:50  _____________________________________________________________________  External Attachment:    Type:   Image     Comment:   External Document

## 2010-06-26 NOTE — Letter (Signed)
Summary: Colonoscopy Letter  [facility]  [place], [place] [address]   Phone: [phone]  Fax: [phone]      January 19, 2010 MRN: [id number]   Rebecca Mcintyre [address] [place], [place]  [address]   Dear Ms. KAWAHARA,   According to your medical record, it is time for you to schedule a Colonoscopy. The American Cancer Society recommends this procedure as a method to detect early colon cancer. Patients with a family history of colon cancer, or a personal history of colon polyps or inflammatory bowel disease are at increased risk.  This letter has been generated based on the recommendations made at the time of your procedure. If you feel that in your particular situation this may no longer apply, please contact our office.  Please call our office at [phone] to schedule this appointment or to update your records at your earliest convenience.  Thank you for cooperating with [place] to provide you with the very best care possible.   Sincerely,  Docia Chuck. Henrene Pastor, M.D.  [facility] [phone]

## 2010-06-28 NOTE — Assessment & Plan Note (Signed)
Summary: URI/dm   Vital Signs:  Patient profile:   65 year old female Menstrual status:  postmenopausal Weight:      164 pounds O2 Sat:      91 % on Room air Temp:     98.0 degrees F oral BP sitting:   112 / 80  (left arm) Cuff size:   regular  Vitals Entered By: Cay Schillings LPN (May 11, 4817 3:06 PM)  O2 Flow:  Room air CC: c/o head and chest congestion - productive cough Is Patient Diabetic? No   CC:  c/o head and chest congestion - productive cough.  History of Present Illness: 65 year old patient who presents with a one-week history of chest congestion, productive cough.  She is also noticed some sinus congestion and low-grade fever.  There is been no high fever or chills.  She has been using a number of OTC medications without much benefit.  She has a history of coronary artery disease and remote tobacco use.  She has glucose intolerance and treated hypertension, as well as dyslipidemia  Allergies: 1)  Lipitor  Review of Systems       The patient complains of anorexia, fever, hoarseness, and prolonged cough.  The patient denies weight loss, weight gain, vision loss, decreased hearing, chest pain, syncope, dyspnea on exertion, peripheral edema, headaches, hemoptysis, abdominal pain, melena, hematochezia, severe indigestion/heartburn, hematuria, incontinence, genital sores, muscle weakness, suspicious skin lesions, transient blindness, difficulty walking, depression, unusual weight change, abnormal bleeding, enlarged lymph nodes, angioedema, and breast masses.    Physical Exam  General:  Well-developed,well-nourished,in no acute distress; alert,appropriate and cooperative throughout examination Head:  Normocephalic and atraumatic without obvious abnormalities. No apparent alopecia or balding. Eyes:  No corneal or conjunctival inflammation noted. EOMI. Perrla. Funduscopic exam benign, without hemorrhages, exudates or papilledema. Vision grossly normal. Ears:   chronic changes only Mouth:  Oral mucosa and oropharynx without lesions or exudates.  Teeth in good repair. Neck:  No deformities, masses, or tenderness noted. Lungs:   coarse diffuse rhonchi and mild scattered wheezing O2 saturation 92-93%normal respiratory effort, no intercostal retractions, and no accessory muscle use.   Heart:  Normal rate and regular rhythm. S1 and S2 normal without gallop, murmur, click, rub or other extra sounds.  no tachycardia   Impression & Recommendations:  Problem # 1:  URI (ICD-465.9)  Her updated medication list for this problem includes:    Adprin B 325 Mg Tabs (Aspirin buf(cacarb-mgcarb-mgo))    Hydrocodone-homatropine 5-1.5 Mg/54m Syrp (Hydrocodone-homatropine) ..[name].. 1 teaspoon every 6 hours as needed for cough or congestion  Problem # 2:  BRONCHITIS, ACUTE WITH MILD BRONCHOSPASM (ICD-466.0)  Her updated medication list for this problem includes:    Azithromycin 250 Mg Tabs (Azithromycin) ..[name]..Marland KitchenTwo initially, then one daily for 4 days    Hydrocodone-homatropine 5-1.5 Mg/574mSyrp (Hydrocodone-homatropine) ...[name]. 1 teaspoon every 6 hours as needed for cough or congestion  Complete Medication List: 1)  Adprin B 325 Mg Tabs (Aspirin buf(cacarb-mgcarb-mgo)) 2)  Nitroquick 0.4 Mg Subl (Nitroglycerin) .... Sl as needed 3)  Metoprolol Tartrate 25 Mg Tabs (Metoprolol tartrate) .... 1/2  by mouth two times a day 4)  Vitamin D 400 Unit Tabs (Cholecalciferol) 5)  Multivitamins Tabs (Multiple vitamin) 6)  Fish Oil Oil (Fish oil) 7)  Metformin Hcl 500 Mg Tabs (Metformin hcl) ...[name] 1 by mouth two times a day 8)  Crestor 20 Mg Tabs (Rosuvastatin calcium) ...[name] 1 by mouth once daily 9)  Azithromycin 250 Mg Tabs (Azithromycin) .... Two  initially, then one daily for 4 days 10)  Hydrocodone-homatropine 5-1.5 Mg/4m Syrp (Hydrocodone-homatropine) ..[name]. 1 teaspoon every 6 hours as needed for cough or congestion  Patient Instructions: 1)  Get plenty of rest, drink lots of  clear liquids, and use Tylenol or Ibuprofen for fever and comfort. Return in 7-10 days if you're not better:sooner if you're feeling worse. 2)  Take your antibiotic as prescribed until ALL of it is gone, but stop if you develop a rash or swelling and contact our office as soon as possible. Prescriptions: HYDROCODONE-HOMATROPINE 5-1.5 MG/5ML SYRP (HYDROCODONE-HOMATROPINE) 1 teaspoon every 6 hours as needed for cough or congestion  #6 oz x 0   Entered and Authorized by:   PMarletta Lor MD   Signed by:   PMarletta Lor MD on 05/11/2010   Method used:   Print then Give to Patient   RxID:   [id number] 250 MG TABS (AZITHROMYCIN) two initially, then one daily for 4 days  #6 x 0   Entered and Authorized by:   PMarletta Lor MD   Signed by:   PMarletta Lor MD on 05/11/2010   Method used:   Print then Give to Patient   RxID:   [id number]   Orders Added: 1)  Est. Patient Level III [[18590]

## 2010-06-28 NOTE — Letter (Signed)
Summary: [facility]   Imported By: Rise Patience 05/16/2010 10:55:36  _____________________________________________________________________  External Attachment:    Type:   Image     Comment:   External Document

## 2010-06-28 NOTE — Progress Notes (Signed)
  Phone Note Call from Patient Call back at Home Phone [phone] Call back at [phone]   Caller: Patient Summary of Call: Pt is calling about the crestor rx. She states that the company she is going with never got the rx. She wants [place] to print out a rx and she will come by and pick it up. Initial call taken by: Sherron Monday, CMA (AAMA),  June 06, 2010 12:42 PM    Prescriptions: CRESTOR 20 MG TABS (ROSUVASTATIN CALCIUM) 1 by mouth once daily  #90 x 0   Entered by:   Sherron Monday, CMA (AAMA)   Authorized by:   Laurey Morale MD   Signed by:   Sherron Monday, CMA ([place]) on 06/06/2010   Method used:   Print then Give to Patient   RxID:   [id number]

## 2010-06-28 NOTE — Progress Notes (Signed)
Summary: refill on cough syrup  Phone Note Call from Patient Call back at Home Phone [phone]   Caller: Patient Summary of Call: Pt saw Dr. Raliegh Ip last friday. She is feeling better but still has the cough and would like a refill on hydrocodone cough syrup. Initial call taken by: Sherron Monday, [place] Deborra Medina),  May 16, 2010 4:44 PM  Follow-up for Phone Call        Pt callled to check on status of refill for cough syrup and zpak. [facility].   Follow-up by: Braulio Bosch,  May 17, 2010 8:30 AM  Additional Follow-up for Phone Call Additional follow up Details #1::        ok to refill the cough med  No need to refill the z pack as  5 days  of med works to fight infrection  for 10 days in the body   if  getting fevers recurring  or not improved after a week of rx  then  call for advise  Additional Follow-up by: Burnis Medin MD,  May 17, 2010 12:12 PM    Additional Follow-up for Phone Call Additional follow up Details #2::    Rx called in and pt aware. Follow-up by: Sherron Monday, CMA (AAMA),  May 17, 2010 3:02 PM  Prescriptions: HYDROCODONE-HOMATROPINE 5-1.5 MG/5ML SYRP (HYDROCODONE-HOMATROPINE) 1 teaspoon every 6 hours as needed for cough or congestion  #6 oz x 0   Entered by:   Sherron Monday, CMA (AAMA)   Authorized by:   Burnis Medin MD   Signed by:   Sherron Monday, CMA (AAMA) on 05/17/2010   Method used:   Telephoned to ...       [facility] [address] [id number]       [place]       [place], [place]  [address]       Ph: [phone] or [phone]       Fax: [phone]   RxID:   [id number]

## 2010-06-28 NOTE — Progress Notes (Signed)
Summary: refill Crestor  Phone Note From Pharmacy   Caller: [facility] Reason for Call: Needs renewal Details for Reason: crestor Summary of Call: Pt has [facility]. I left a message for pt to call back to confirm that she wants [place] to send this rx to them. Initial call taken by: Sherron Monday, CMA Deborra Medina),  May 08, 2010 4:39 PM  Follow-up for Phone Call        Spoke with pt and she states that this is okay [facility]. Rx sent to pharmacy Follow-up by: Sherron Monday, CMA Deborra Medina),  May 09, 2010 8:28 AM    Prescriptions: CRESTOR 20 MG TABS (ROSUVASTATIN CALCIUM) 1 by mouth once daily  #90 x 3   Entered by:   Sherron Monday, CMA (AAMA)   Authorized by:   Burnis Medin MD   Signed by:   Sherron Monday, CMA (AAMA) on 05/09/2010   Method used:   Electronically to        [facility] [address] ([facility])       [place]       [address] [place]  [address]       Ph: [phone]       Fax: [phone]   [place]:   [phone]

## 2010-09-14 ENCOUNTER — Encounter: Payer: Self-pay | Admitting: Internal Medicine

## 2010-10-12 NOTE — Cardiovascular Report (Signed)
[place]. [facility]  Patient:    Rebecca Mcintyre, Rebecca Mcintyre                        MRN: [id number] Proc. Date: 12/04/00 Adm. Date:  [id number] Attending:  Fatima Sanger CC:         Cardiac Catheterization Laboratory   Cardiac Catheterization  PROCEDURES PERFORMED: 1. Left heart catheterization with coronary angiography and left    ventriculography. 2. Percutaneous intervention in the proximal left anterior descending    utilizing the AngioJet thrombectomy catheter followed by percutaneous    transluminal coronary angioplasty and stent placement.  INDICATIONS:  The patient is a 65 year old woman, who presented to [facility] Emergency Room with severe substernal chest pain.  An ECG showed significant ST segment elevation in the anterolateral leads consistent with an acute myocardial infarction.  She was treated with heparin, nitroglycerin and Integrilin, and achieved relief of her chest pain and resolution of her ECG changes.  However, she had intermittent recurrent episodes of chest pain and was transferred emergently to [facility] Catheterization Lab for cardiac catheterization.  CATHETERIZATION PROCEDURE:  A 7 French sheath was placed in the right femoral artery.  Standard Judkins 6 French catheters were utilized.  Contrast was Omnipaque.  There were no complications.  RESULTS:  HEMODYNAMICS:  Left ventricular pressure 130/24.  Aortic pressure 130/80. There was no aortic valve gradient.  LEFT VENTRICULOGRAM:  There is severe akinesis of the anterior and apical walls.  Ejection fraction calculated at 50%.  There is no mitral regurgitation.  CORONARY ARTERIOGRAPHY:  (Right dominant).  Left main:  Normal.  Left anterior descending:  The left anterior descending artery has a 95% stenosis in the proximal vessel with thrombus followed by a 90% stenosis.  The mid vessel has a 40% stenosis.  There is a small first diagonal with an 80% stenosis at its  origin and normal sized second diagonal.  Left circumflex:  The left circumflex has a tubular 30% stenosis at its origin.  The circumflex gives rise to a small OM-1, large branching OM-2 and a small OM-3.  OM-2 has a 40% stenosis in the main branch of the vessel and a 70% stenosis in a small branch.  Right coronary artery:  The right coronary artery is a dominant vessel.  There is a 30% stenosis in the proximal vessel, 30% in the mid vessel and a 30% stenosis in the distal AV groove portion of the right coronary artery just beyond the posterior descending artery.  The posterior descending artery itself has a 40% stenosis at its origin.  The right coronary artery gives rise to a large posterior descending artery, a small first and second posterolateral branch and a large third posterolateral branch.  IMPRESSIONS: 1. Mildly decreased left ventricular systolic function secondary to an    acute anteroapical myocardial infarction.  Some of this may represent    myocardial stenting. 2. One-vessel coronary artery disease characterized by a 95% stenosis in the    proximal left anterior descending with thrombus but TIMI-3 flow, likely    secondary to spontaneous reperfusion.  PLAN:  Percutaneous intervention to the LAD.  See below.  PERCUTANEOUS TRANSLUMINAL CORONARY ANGIOPLASTY PROCEDURE:  The patient was enrolled in the EMERALD trial and randomized to treatment with AngioJet thrombectomy catheter.  We therefore placed a 6 French sheath in the right femoral vein and a temporary pacemaker in the right ventricular apex. Intravenous aminophylline was also administered to prevent  bradycardia.  We used a 7 [place] guiding catheter.  Heparin and Integrilin were continued throughout the case.  A Hi-Torque Floppy wire was advanced under fluoroscopic guidance into the distal LAD.  We then performed AngioJet thrombectomy for a total of three passes.  This did result in significant removal of  thrombus. However, there is a residual 90% stenosis in the secondary lesion in the proximal LAD.  We then performed PTCA with a 2.5 x 15 mm Quantum balloon inflated to 6 atmospheres.  Following this, there is a residual 60% stenosis. We therefore deployed a 2.25 x 18 mm Pixel stent across both the original 95% lesion and a secondary 90% lesion.  This stent was deployed at a pressure of 12 atmospheres.  We post-dilated the more proximal portion of this stent with a 2.5 x 15 mm Quantum balloon inflated to 14 atmospheres.  Final angiographic images revealed patency to the LAD with 0% residual stenosis and TIMI-3 flow.  COMPLICATIONS:  None.  RESULTS:  Successful AngioJet thrombectomy followed by percutaneous transluminal coronary angioplasty with stent placement in the proximal left anterior descending, reducing a 95% stenosis with thrombus followed by a 90% stenosis to 0% residual with TIMI-3 flow.  PLAN:  Integrilin will be continued for an additional 24 hours.  Plavix will be administered for four weeks. DD:  12/04/00 TD:  12/05/00 Job: [id number]

## 2010-10-12 NOTE — Discharge Summary (Signed)
[place]. [facility]  Patient:    Rebecca Mcintyre, Rebecca Mcintyre                        MRN: [id number] Adm. Date:  [id number] Disc. Date: [id number] Attending:  Vira Agar Dictator:   Sharyl Nimrod, P.A.-C.                  Referring Physician Discharge [place]:  02/11/2047  SUMMARY OF HISTORY:  Ms. Rebecca Mcintyre is a 65 year old white female who was transferred from the [facility] Emergency Room secondary to myocardial infarction on December 04, 2000.  She stated that around 1:45 p.m., she developed left arm radiating into anterior chest tightness associated with shortness of breath, nausea, and diaphoresis.  This occurred while she was driving from work.  The discomfort would wax and wane over the next several hours and she decided to come to the emergency room.  She has not had any prior recurrences.  She denies any exertional chest discomfort, shortness of breath, palpitations, orthopnea, PND, syncope, or GI or GU problems.  Her history is notable for tobacco use of one and a half packs per day.  LABORATORY DATA:  At [facility], the hemoglobin was 15.8, hematocrit 46.0, normal indices, platelets 326, and WBC 8.4.  PT 12.5, PTT 29.  Her EKG with discomfort showed lateral ST segment elevation.  Without discomfort, her lateral T waves were inverted.  Her admission chemistries showed a sodium of 143, potassium 3.3, BUN 13, creatinine 0.8, and glucose 146.  Normal LFTs.  The initial CK was normal at 49 and troponin 0.2.  Subsequent CK isoenzymes peaked within eight hours at a level of 262 with an MB of 39.7.  Subsequent enzymes were declining.  Fasting lipids showed a total cholesterol of 205, triglycerides 160, HDL 35, and LDL 138.  The hemoglobin A1C was 5.9.  The TSH was 3.485.  Subsequent hematologies were unremarkable.  Subsequent chemistries showed correction of her hypokalemia.  On December 07, 2000, prior to discharge, the potassium was 4.0, BUN  10, creatinine 0.9, hemoglobin 14.3, and hematocrit 40.7.  HOSPITAL COURSE:  [name] was taken emergently to the cardiac catheterization laboratory by Allene Dillon, M.D.  According to his progress notes, the catheterization showed an LV pressure of 130/24 and an aortic pressure of 130/80.  She had an EF of 50% with severe anteroapical akinesis and no mitral regurgitation.  She had a 30% proximal RCA, 30% mid RCA, distal 30% RCA, and 90% PDA.  She had a 30% proximal circumflex, 40% mid circumflex, and mid 70% OM2.  Her LAD had a 95% proximal lesion with thrombus.  She had a small diagonal 1 80% lesion and she had a mid 40% lesion in the LAD.  After reviewing the films, Allene Dillon, M.D., performed angioplasty and stenting, reducing the LAD to 0% without difficulty.  She remained on Integrilin for 24 hours post sheath removal and bed rest.  She had not had any further chest discomfort.  Cardiac rehabilitation again assisted with education and ambulation.  As noted on December 06, 2000, the patient was very aggravated and frustrated with being awaken up several times during the night.  However, she had not had any further chest discomfort and medications were adjusted.  The hemoglobin A1C was evaluated secondary to slightly elevated blood sugars on her a.m. BMPs.  This was within normal limits.  By December 07, 2000, [name], M.D., felt that she could be discharged home.  DISCHARGE DIAGNOSES: 1. Acute anterior myocardial infarction requiring emergent catheterization as    previously described. 2. Status post angioplasty and stenting to the left anterior descending. 3. Hyperlipidemia. 4. Tobacco use.  DISPOSITION:  She was discharged home.  DISCHARGE MEDICATIONS: 1. Plavix 75 mg q.d. for four weeks. 2. Coated aspirin 325 mg q.d. 3. Toprol XL 25 mg q.d. 4. Lipitor 10 mg q.h.s. 5. Sublingual nitroglycerin as needed. 6. Wellbutrin 150 mg b.i.d. for seven weeks. 7. She was given  permission to continue her hormones.  ACTIVITY:  She was instructed no lifting, driving, sexual activity, working, or heavy exertion until seen by the physician after two weeks.  She was given permission to return to work part-time.  DIET:  Maintain a low-salt, low-fat, and low-cholesterol diet.  WOUND CARE:  If she had any problems with her catheterization site, she was asked to call.  SPECIAL INSTRUCTIONS:  She was advised no smoking and no tobacco products. She will need fasting lipids and LFTs in approximately six weeks.  FOLLOW-UP:  She will call on Monday afternoon to arrange a two-week appointment with Allene Dillon, M.D.  Consideration should also be given to finding her a primary care physician. DD:  12/07/00 TD:  12/07/00 Job: [id number]

## 2010-10-16 ENCOUNTER — Telehealth: Payer: Self-pay | Admitting: *Deleted

## 2010-10-16 DIAGNOSIS — L989 Disorder of the skin and subcutaneous tissue, unspecified: Secondary | ICD-10-CM

## 2010-10-16 NOTE — Telephone Encounter (Signed)
Pt called saying that she needs a referral to a derm because the spot on her foot is getting uglier. According to last ov note: right foot  small corn like lesion and no redness and no tenderness. Pt also wants to change from Crestor which is too expensive to something cheaper. Pt is allergic to Lipitor according to her chart.

## 2010-10-17 ENCOUNTER — Other Ambulatory Visit: Payer: Self-pay | Admitting: Internal Medicine

## 2010-10-17 NOTE — Telephone Encounter (Signed)
Rx sent to pharmacy.

## 2010-10-18 NOTE — Telephone Encounter (Signed)
Ok to refer to derm  Other meds are not as powerful but can try  Simvastatin 20 mg per day  Disp 30 refill x 3 and plan recheck lipid and lfts hg a1c  in 2 months . And then ROV .

## 2010-10-23 ENCOUNTER — Other Ambulatory Visit: Payer: Self-pay | Admitting: Internal Medicine

## 2010-10-23 MED ORDER — SIMVASTATIN 20 MG PO TABS: 20.0000 mg | ORAL_TABLET | Freq: Every evening | ORAL | Status: DC

## 2010-10-23 NOTE — Telephone Encounter (Signed)
Left message on machine about below. Order sent to [facility] and rx sent to pharmacy.

## 2010-11-12 ENCOUNTER — Other Ambulatory Visit: Payer: Self-pay | Admitting: Internal Medicine

## 2011-01-09 ENCOUNTER — Telehealth: Payer: Self-pay | Admitting: Internal Medicine

## 2011-01-09 MED ORDER — SIMVASTATIN 20 MG PO TABS: 20.0000 mg | ORAL_TABLET | Freq: Every evening | ORAL | Status: DC

## 2011-01-09 MED ORDER — METOPROLOL TARTRATE 25 MG PO TABS: ORAL_TABLET | ORAL | Status: DC

## 2011-01-09 MED ORDER — METFORMIN HCL 500 MG PO TABS: 500.0000 mg | ORAL_TABLET | Freq: Two times a day (BID) | ORAL | Status: DC

## 2011-01-09 NOTE — Telephone Encounter (Signed)
Pt is coming in for cpx on 05/01/11 which was the first available time slot for a cpx. Pt will need refills before then on :  Metoprolo Tartate 55m  Matformin 5069m Simvastin 203mend to [facility]  ** pt is concerned to wait till December for cpx, last time pt was here meds for her cholesterol were changed and Dr PanRegis Billnted pt to come back to check blood work. Pt was hoping to get this checked with her lab work for cpx if pt comes in prior she will have to pay out of pocket her insurance only covers 1 lab a year.

## 2011-01-09 NOTE — Telephone Encounter (Signed)
Pt aware rx sent to pharmacy

## 2011-04-22 ENCOUNTER — Other Ambulatory Visit (INDEPENDENT_AMBULATORY_CARE_PROVIDER_SITE_OTHER): Payer: PRIVATE HEALTH INSURANCE

## 2011-04-22 DIAGNOSIS — Z0000 Encounter for general adult medical examination without abnormal findings: Secondary | ICD-10-CM

## 2011-04-22 LAB — CBC WITH DIFFERENTIAL/PLATELET
Basophils Relative: 0.7 % (ref 0.0–3.0)
Eosinophils Relative: 3.8 % (ref 0.0–5.0)
HCT: 44.5 % (ref 36.0–46.0)
Hemoglobin: 15.1 g/dL — ABNORMAL HIGH (ref 12.0–15.0)
Lymphs Abs: 2.4 10*3/uL (ref 0.7–4.0)
Monocytes Relative: 10 % (ref 3.0–12.0)
Neutro Abs: 2.7 10*3/uL (ref 1.4–7.7)
RBC: 4.97 Mil/uL (ref 3.87–5.11)
WBC: 6.1 10*3/uL (ref 4.5–10.5)

## 2011-04-22 LAB — POCT URINALYSIS DIPSTICK
Glucose, UA: NEGATIVE
Ketones, UA: NEGATIVE
Leukocytes, UA: NEGATIVE
Protein, UA: NEGATIVE

## 2011-04-22 LAB — BASIC METABOLIC PANEL
Chloride: 104 mEq/L (ref 96–112)
GFR: 99.12 mL/min (ref >60.00)
Potassium: 3.9 mEq/L (ref 3.5–5.1)
Sodium: 142 mEq/L (ref 135–145)

## 2011-04-22 LAB — HEMOGLOBIN A1C: Hgb A1c MFr Bld: 6 % (ref 4.6–6.5)

## 2011-04-22 LAB — HEPATIC FUNCTION PANEL
ALT: 12 U/L (ref 0–35)
AST: 16 U/L (ref 0–37)
Albumin: 4.1 g/dL (ref 3.5–5.2)
Total Bilirubin: 0.7 mg/dL (ref 0.3–1.2)
Total Protein: 7 g/dL (ref 6.0–8.3)

## 2011-04-22 LAB — LIPID PANEL: VLDL: 28.6 mg/dL (ref 0.0–40.0)

## 2011-04-22 LAB — TSH: TSH: 1.54 u[IU]/mL (ref 0.35–5.50)

## 2011-04-26 ENCOUNTER — Encounter: Payer: Self-pay | Admitting: Internal Medicine

## 2011-04-29 ENCOUNTER — Ambulatory Visit (INDEPENDENT_AMBULATORY_CARE_PROVIDER_SITE_OTHER): Payer: PRIVATE HEALTH INSURANCE | Admitting: Internal Medicine

## 2011-04-29 ENCOUNTER — Encounter: Payer: Self-pay | Admitting: Internal Medicine

## 2011-04-29 ENCOUNTER — Encounter: Payer: PRIVATE HEALTH INSURANCE | Admitting: Internal Medicine

## 2011-04-29 VITALS — BP 120/80 | HR 72 | Ht 64.0 in | Wt 169.0 lb

## 2011-04-29 DIAGNOSIS — J329 Chronic sinusitis, unspecified: Secondary | ICD-10-CM

## 2011-04-29 DIAGNOSIS — R7309 Other abnormal glucose: Secondary | ICD-10-CM

## 2011-04-29 DIAGNOSIS — I2510 Atherosclerotic heart disease of native coronary artery without angina pectoris: Secondary | ICD-10-CM

## 2011-04-29 DIAGNOSIS — J019 Acute sinusitis, unspecified: Secondary | ICD-10-CM

## 2011-04-29 DIAGNOSIS — I252 Old myocardial infarction: Secondary | ICD-10-CM

## 2011-04-29 DIAGNOSIS — Z0000 Encounter for general adult medical examination without abnormal findings: Secondary | ICD-10-CM

## 2011-04-29 DIAGNOSIS — E785 Hyperlipidemia, unspecified: Secondary | ICD-10-CM

## 2011-04-29 DIAGNOSIS — Z23 Encounter for immunization: Secondary | ICD-10-CM

## 2011-04-29 DIAGNOSIS — I10 Essential (primary) hypertension: Secondary | ICD-10-CM

## 2011-04-29 MED ORDER — AMOXICILLIN-POT CLAVULANATE 875-125 MG PO TABS: 1.0000 | ORAL_TABLET | Freq: Two times a day (BID) | ORAL | Status: AC

## 2011-04-29 NOTE — Progress Notes (Signed)
Subjective:    Patient ID: Rebecca Mcintyre, female    DOB: [birth year], 65 y.o.   MRN: [id number]  HPI  Patient comes in today for Preventive Health Care visit  URI head cold   For 2 weeks and using netti pot sinus pressure.  Getting better and now worse again.No fever and draining   Saw Dr Constance Holster and tripped  And fell on nose and saw dr Constance Holster  This summer.  Nose injury but not a fracture   LIPIDs change from crestor to simva cause of cost CAD no cp sob  Any sx  Related .  BP is good.   Review of Systems ROS:  GEN/ HEENTNo fever, significant weight changes sweats headaches vision problems hearing changes, except as above.  CV/ PULM; No chest pain shortness of breath cough, syncope,edema  change in exercise tolerance. GI /GU: No adominal pain, vomiting, change in bowel habits. No blood in the stool. No significant GU symptoms. SKIN/HEME: ,no acute skin rashes suspicious lesions or bleeding. No lymphadenopathy, nodules, masses.  NEURO/ PSYCH:  No neurologic signs such as weakness numbness No depression anxiety. IMM/ Allergy: No unusual infections.  Allergy .   REST of 12 system review negative Past Medical History  Diagnosis Date  . Myocardial infarction     anterior stemi, hx of 2002 stent in front artery of her heart. 95% lad with intervention last stress test 2006 nl lv function hypertensive reponse subendocardial scar  . Hypertension   . Hyperlipidemia   . Gout   . Osteopenia   . Osteopenia   . Solitary kidney   . ASCUS on Pap smear     had repeat x 2 normal with [name] Plan 2011    History   Social History  . Marital Status: Married    Spouse Name: N/A    Number of Children: N/A  . Years of Education: N/A   Occupational History  . Not on file.   Social History Main Topics  . Smoking status: Former Research scientist (life sciences)  . Smokeless tobacco: Not on file   Comment: 37 years  . Alcohol Use: Yes     1-2 per month  . Drug Use: No  . Sexually Active:    Other Topics Concern  .  Not on file   Social History Narrative   MarriedRegular exercise-yes6-7 hours sleepDivorcedOccupation: realtor    Past Surgical History  Procedure Date  . Right oophorectomy with salpingectomy     benign growth Dr. Joneen Caraway  . Lad stent angioplasty 2002  . Hemorrhoid surgery 2011    Family History  Problem Relation Age of Onset  . Breast cancer Mother   . Lung cancer Father   . Heart attack Brother   . Colon cancer Brother     Allergies  Allergen Reactions  . Atorvastatin     REACTION: myalgias  and liver    Current Outpatient Prescriptions on File Prior to Visit  Medication Sig Dispense Refill  . aspirin 325 MG tablet Take 325 mg by mouth daily.        . Ergocalciferol (VITAMIN D2) 400 UNITS TABS Take by mouth.        . fish oil-omega-3 fatty acids 1000 MG capsule Take 2 g by mouth daily.        . metFORMIN (GLUCOPHAGE) 500 MG tablet Take 1 tablet (500 mg total) by mouth 2 (two) times daily with a meal.  60 tablet  3  . metoprolol tartrate (LOPRESSOR) 25 MG tablet 1/2  bid  90 tablet  1  . MULTIPLE VITAMIN PO Take by mouth.        . nitroGLYCERIN (NITROSTAT) 0.4 MG SL tablet Place 0.4 mg under the tongue every 5 (five) minutes as needed.        . simvastatin (ZOCOR) 20 MG tablet Take 1 tablet (20 mg total) by mouth every evening.  30 tablet  3    BP 120/80  Pulse 72  Ht _0  (1.626 m)  Wt 169 lb (76.658 kg)  BMI 29.01 kg/m2     Objective:   Physical Exam  Wt Readings from Last 3 Encounters:  04/29/11 169 lb (76.658 kg)  05/11/10 164 lb (74.39 kg)  03/20/10 150 lb (68.04 kg)  .  Physical Exam: Vital signs reviewed JIZ:XYOF is a well-developed well-nourished alert cooperative  white female who appears her stated age in no acute distress.  HEENT: normocephalic  traumatic , Eyes: PERRL EOM's full, conjunctiva clear, Nares: paten,t nmild deformity discharge or tenderness.very congested , Ears: no deformity EAC's clear TMs with normal landmarks. Mouth: clear OP,  no lesions, edema.  Moist mucous membranes. Dentition in adequate repair. NECK: supple without masses, thyromegaly or bruits. CHEST/PULM:  Clear to auscultation and percussion breath sounds equal no wheeze , rales or rhonchi. No chest wall deformities or tenderness. CV: PMI is nondisplaced, S1 S2 no gallops, murmurs, rubs. Peripheral pulses are full without delay.No JVD .  Breast: normal by inspection . No dimpling, discharge, masses, tenderness or discharge .  ABDOMEN: Bowel sounds normal nontender  No guard or rebound, no hepato splenomegal no CVA tenderness.  No hernia. Extremtities:  No clubbing cyanosis or edema, no acute joint swelling or redness no focal atrophy NEURO:  Oriented x3, cranial nerves 3-12 appear to be intact, no obvious focal weakness,gait within normal limits no abnormal reflexes or asymmetrical SKIN: No acute rashes normal turgor, color, no bruising or petechiae. PSYCH: Oriented, good eye contact, no obvious depression anxiety, cognition and judgment appear normal. Lab Results  Component Value Date   WBC 6.1 04/22/2011   HGB 15.1* 04/22/2011   HCT 44.5 04/22/2011   PLT 237.0 04/22/2011   GLUCOSE 88 04/22/2011   CHOL 156 04/22/2011   TRIG 143.0 04/22/2011   HDL 45.00 04/22/2011   LDLCALC 82 04/22/2011   ALT 12 04/22/2011   AST 16 04/22/2011   NA 142 04/22/2011   K 3.9 04/22/2011   CL 104 04/22/2011   CREATININE 0.6 04/22/2011   BUN 15 04/22/2011   CO2 29 04/22/2011   TSH 1.54 04/22/2011   HGBA1C 6.0 04/22/2011         Assessment & Plan:  Preventive Health Care Counseled regarding healthy nutrition, exercise, sleep, injury prevention, calcium vit d and healthy weight .  CAD  No sx never got assigned new cards   And doing well  Will plan cards reestablish on medicare . Because cost is a factor May consider stress test. Has no sx at all at this point.By hx  Hyperglycemia pre diabetes doing well  LIPIDS  Ok on generic simvasatin ldl at goal Sinusitis  prolonged uri  Maxillary  Hx of nose injury this summer prob not related .. treate with antibiotic  And fu if needed Obesity  Has los a lot of weight cautioned creeping back up again.

## 2011-04-29 NOTE — Patient Instructions (Signed)
Take antibiotic for your sinus infection and continue saline washes . Expect improvement in 3-4 days can use afrin for 2-3 days for congestion in the meantime. Continue lifestyle intervention healthy eating and exercise . No change in meds   Call when want to go ahead with colonoscopy. Also call when you want [place] to set up appt with cardiology also  Or  As needed.

## 2011-05-09 ENCOUNTER — Other Ambulatory Visit: Payer: Self-pay | Admitting: Internal Medicine

## 2011-06-03 ENCOUNTER — Telehealth: Payer: Self-pay | Admitting: *Deleted

## 2011-06-03 NOTE — Telephone Encounter (Signed)
Per [name]- CT Sinus then we can go from there.

## 2011-06-03 NOTE — Telephone Encounter (Signed)
Pt calling saying that she would like to have a 2nd round of antibiotics. Pt is still having sinus pressure and headaches.

## 2011-06-03 NOTE — Telephone Encounter (Signed)
Spoke to pt and she wants to hold off on the ct because it is out of pocket for her. So she will do her OTC stuff.

## 2011-07-03 ENCOUNTER — Other Ambulatory Visit: Payer: Self-pay | Admitting: Internal Medicine

## 2011-07-12 ENCOUNTER — Other Ambulatory Visit: Payer: Self-pay | Admitting: Internal Medicine

## 2011-08-02 ENCOUNTER — Other Ambulatory Visit: Payer: Self-pay | Admitting: Internal Medicine

## 2011-09-30 ENCOUNTER — Other Ambulatory Visit: Payer: Self-pay | Admitting: Internal Medicine

## 2011-10-01 ENCOUNTER — Other Ambulatory Visit: Payer: Self-pay | Admitting: Internal Medicine

## 2011-10-23 ENCOUNTER — Encounter: Payer: Self-pay | Admitting: Internal Medicine

## 2011-10-28 ENCOUNTER — Encounter: Payer: Self-pay | Admitting: Internal Medicine

## 2011-10-28 ENCOUNTER — Ambulatory Visit (INDEPENDENT_AMBULATORY_CARE_PROVIDER_SITE_OTHER): Payer: Medicare Other | Admitting: Internal Medicine

## 2011-10-28 VITALS — BP 120/80 | HR 103 | Temp 97.9°F | Wt 174.0 lb

## 2011-10-28 DIAGNOSIS — R7309 Other abnormal glucose: Secondary | ICD-10-CM

## 2011-10-28 DIAGNOSIS — L255 Unspecified contact dermatitis due to plants, except food: Secondary | ICD-10-CM | POA: Insufficient documentation

## 2011-10-28 MED ORDER — PREDNISONE 10 MG PO TABS: 10.0000 mg | ORAL_TABLET | Freq: Every day | ORAL | Status: DC

## 2011-10-28 NOTE — Patient Instructions (Signed)
  Poison Ivy or rhus dermatitis  Poison ivy is a inflammation of the skin (contact dermatitis) caused by touching the allergens on the leaves of the ivy plant following previous exposure to the plant. The rash usually appears 48 hours after exposure. The rash is usually bumps (papules) or blisters (vesicles) in a linear pattern. Depending on your own sensitivity, the rash may simply cause redness and itching, or it may also progress to blisters which may break open. These must be well cared for to prevent secondary bacterial (germ) infection, followed by scarring. Keep any open areas dry, clean, dressed, and covered with an antibacterial ointment if needed. The eyes may also get puffy. The puffiness is worst in the morning and gets better as the day progresses. This dermatitis usually heals without scarring, within 2 to 3 weeks without treatment. HOME CARE INSTRUCTIONS  Thoroughly wash with soap and water as soon as you have been exposed to poison ivy. You have about one half hour to remove the plant resin before it will cause the rash. This washing will destroy the oil or antigen on the skin that is causing, or will cause, the rash. Be sure to wash under your fingernails as any plant resin there will continue to spread the rash. Do not rub skin vigorously when washing affected area. Poison ivy cannot spread if no oil from the plant remains on your body. A rash that has progressed to weeping sores will not spread the rash unless you have not washed thoroughly. It is also important to wash any clothes you have been wearing as these may carry active allergens. The rash will return if you wear the unwashed clothing, even several days later. Avoidance of the plant in the future is the best measure. Poison ivy plant can be recognized by the number of leaves. Generally, poison ivy has three leaves with flowering branches on a single stem. Diphenhydramine may be purchased over the counter and used as needed for  itching. Do not drive with this medication if it makes you drowsy.Ask your caregiver about medication for children. SEEK MEDICAL CARE IF:  Open sores develop.   Redness spreads beyond area of rash.   You notice purulent (pus-like) discharge.   You have increased pain.   Other signs of infection develop (such as fever).  Document Released: 05/10/2000 Document Revised: 05/02/2011 Document Reviewed: 03/29/2009 [facility] Patient Information 2012 [place].

## 2011-10-28 NOTE — Progress Notes (Signed)
  Subjective:    Patient ID: Rebecca Mcintyre, female    DOB: 10/24/1945, 66 y.o.   MRN: [id number]  HPI Patient comes in today for SDA for  new problem evaluation  Picked vine off of a tree about 5 days ago and washed hands then 2-3 day ago had itchpatches on neck and face and forehead.  Unsure wht to do told not to use cream on face. No hx of pi she can remember.   Dm doin well.   Review of Systems No fever vision change other rashes Past history family history social history reviewed in the electronic medical record.     Objective:   Physical Exam BP 120/80  Pulse 103  Temp(Src) 97.9 F (36.6 C) (Oral)  Wt 174 lb (78.926 kg)  SpO2 94% wdwn in nad with rash on face and forehead/ [place] ATclear  patches of cd on forehead left cheek and  neck     Assessment & Plan:  Contact rhus dermatitis  Disc about cause and   Expectant management. On face pretty extensive  pred rx  Avoidance .   DM monitor BGs  Call if needed.

## 2011-12-02 ENCOUNTER — Ambulatory Visit: Payer: PRIVATE HEALTH INSURANCE | Admitting: Internal Medicine

## 2011-12-08 ENCOUNTER — Other Ambulatory Visit: Payer: Self-pay | Admitting: Internal Medicine

## 2011-12-11 ENCOUNTER — Ambulatory Visit (AMBULATORY_SURGERY_CENTER): Payer: Medicare Other

## 2011-12-11 ENCOUNTER — Encounter: Payer: Self-pay | Admitting: Internal Medicine

## 2011-12-11 VITALS — Ht 64.0 in | Wt 169.6 lb

## 2011-12-11 DIAGNOSIS — Z860100 Personal history of colon polyps, unspecified: Secondary | ICD-10-CM

## 2011-12-11 MED ORDER — MOVIPREP 100 G PO SOLR: 1.0000 | Freq: Once | ORAL | Status: DC

## 2011-12-12 ENCOUNTER — Encounter: Payer: Self-pay | Admitting: Internal Medicine

## 2011-12-16 ENCOUNTER — Ambulatory Visit: Payer: PRIVATE HEALTH INSURANCE | Admitting: Internal Medicine

## 2011-12-25 ENCOUNTER — Encounter: Payer: Self-pay | Admitting: Internal Medicine

## 2011-12-25 ENCOUNTER — Ambulatory Visit (AMBULATORY_SURGERY_CENTER): Payer: Medicare Other | Admitting: Internal Medicine

## 2011-12-25 VITALS — BP 126/68 | HR 78 | Temp 96.3°F | Resp 19 | Ht 64.0 in | Wt 169.0 lb

## 2011-12-25 DIAGNOSIS — Z800 Family history of malignant neoplasm of digestive organs: Secondary | ICD-10-CM

## 2011-12-25 DIAGNOSIS — Z1211 Encounter for screening for malignant neoplasm of colon: Secondary | ICD-10-CM

## 2011-12-25 DIAGNOSIS — D126 Benign neoplasm of colon, unspecified: Secondary | ICD-10-CM

## 2011-12-25 DIAGNOSIS — Z86010 Personal history of colonic polyps: Secondary | ICD-10-CM

## 2011-12-25 LAB — GLUCOSE, CAPILLARY: Glucose-Capillary: 84 mg/dL (ref 70–99)

## 2011-12-25 MED ORDER — SODIUM CHLORIDE 0.9 % IV SOLN: 500.0000 mL | INTRAVENOUS | Status: DC

## 2011-12-25 NOTE — Progress Notes (Signed)
The pt tolerated the colonoscopy very well. Rebecca Mcintyre

## 2011-12-25 NOTE — Progress Notes (Signed)
ORANGE JUICE GIVEN TO PATIENT.

## 2011-12-25 NOTE — Progress Notes (Signed)
Patient did not experience any of the following events: a burn prior to discharge; a fall within the facility; wrong site/side/patient/procedure/implant event; or a hospital transfer or hospital admission upon discharge from the facility. [phone]) Patient did not have preoperative order for IV antibiotic SSI prophylaxis. [phone])

## 2011-12-25 NOTE — Op Note (Signed)
[facility] [employer]. [place], [place]  [address]  COLONOSCOPY PROCEDURE REPORT  PATIENT:  Rebecca, Mcintyre  MR#:  [id number] BIRTHDATE:  24-Jan-1946, 66 yrs. old  GENDER:  female ENDOSCOPIST:  Docia Chuck. Geri Seminole, MD REF. BY:  Surveillance Program Recall, PROCEDURE DATE:  12/25/2011 PROCEDURE:  Colonoscopy with snare polypectomy x 1 ASA CLASS:  Class II INDICATIONS:  history of pre-cancerous (adenomatous) colon polyps, surveillance and high-risk screening, family history of colon cancer ; index 02-2005 ; brother w/ CRC MEDICATIONS:   MAC sedation, administered by CRNA, propofol (Diprivan) 300 mg IV  DESCRIPTION OF PROCEDURE:   After the risks benefits and alternatives of the procedure were thoroughly explained, informed consent was obtained.  Digital rectal exam was performed and revealed no abnormalities.   The LB [id number] endoscope was introduced through the anus and advanced to the cecum, which was identified by both the appendix and ileocecal valve, without limitations.  The quality of the prep was excellent, using MoviPrep.  The instrument was then slowly withdrawn as the colon was fully examined. <<PROCEDUREIMAGES>>  FINDINGS:  A 73m sessile polyp was found in the rectum and snared without cautery. Retrieval was successful.  Moderate diverticulosis was found in the left and right colon. Retroflexed views in the rectum revealed no abnormalities.    The time to cecum =  4  minutes. The scope was then withdrawn in   10 minutes from the cecum and the procedure completed.  COMPLICATIONS:  None  ENDOSCOPIC IMPRESSION: 1) Sessile polyp in the rectum - removed 2) Moderate diverticulosis in the left and right colon  RECOMMENDATIONS: 1) Follow up colonoscopy in 5 years  ______________________________ JDocia Chuck PGeri Seminole MD  CC:  WBurnis Medin MD;  The Patient  n. eSIGNED:   JDocia Chuck PGeri Seminoleat 12/25/2011 10:16 AM  TNicholes Stairs [phone]

## 2011-12-25 NOTE — Patient Instructions (Signed)
YOU HAD AN ENDOSCOPIC PROCEDURE TODAY AT THE [facility]: Refer to the procedure report that was given to you for any specific questions about what was found during the examination.  If the procedure report does not answer your questions, please call your gastroenterologist to clarify.  If you requested that your care partner not be given the details of your procedure findings, then the procedure report has been included in a sealed envelope for you to review at your convenience later.  YOU SHOULD EXPECT: Some feelings of bloating in the abdomen. Passage of more gas than usual.  Walking can help get rid of the air that was put into your GI tract during the procedure and reduce the bloating. If you had a lower endoscopy (such as a colonoscopy or flexible sigmoidoscopy) you may notice spotting of blood in your stool or on the toilet paper. If you underwent a bowel prep for your procedure, then you may not have a normal bowel movement for a few days.  DIET: Your first meal following the procedure should be a light meal and then it is ok to progress to your normal diet.  A half-sandwich or bowl of soup is an example of a good first meal.  Heavy or fried foods are harder to digest and may make you feel nauseous or bloated.  Likewise meals heavy in dairy and vegetables can cause extra gas to form and this can also increase the bloating.  Drink plenty of fluids but you should avoid alcoholic beverages for 24 hours.  ACTIVITY: Your care partner should take you home directly after the procedure.  You should plan to take it easy, moving slowly for the rest of the day.  You can resume normal activity the day after the procedure however you should NOT DRIVE or use heavy machinery for 24 hours (because of the sedation medicines used during the test).    SYMPTOMS TO REPORT IMMEDIATELY: A gastroenterologist can be reached at any hour.  During normal business hours, 8:30 AM to 5:00 PM Monday through Friday,  call [phone].  After hours and on weekends, please call the GI answering service at [phone] who will take a message and have the physician on call contact you.   Following lower endoscopy (colonoscopy or flexible sigmoidoscopy):  Excessive amounts of blood in the stool  Significant tenderness or worsening of abdominal pains  Swelling of the abdomen that is new, acute  Fever of 100F or higher  Following upper endoscopy (EGD)  Vomiting of blood or coffee ground material  New chest pain or pain under the shoulder blades  Painful or persistently difficult swallowing  New shortness of breath  Fever of 100F or higher  Black, tarry-looking stools  FOLLOW UP: If any biopsies were taken you will be contacted by phone or by letter within the next 1-3 weeks.  Call your gastroenterologist if you have not heard about the biopsies in 3 weeks.  Our staff will call the home number listed on your records the next business day following your procedure to check on you and address any questions or concerns that you may have at that time regarding the information given to you following your procedure. This is a courtesy call and so if there is no answer at the home number and we have not heard from you through the emergency physician on call, we will assume that you have returned to your regular daily activities without incident.  SIGNATURES/CONFIDENTIALITY: You and/or your care   partner have signed paperwork which will be entered into your electronic medical record.  These signatures attest to the fact that that the information above on your After Visit Summary has been reviewed and is understood.  Full responsibility of the confidentiality of this discharge information lies with you and/or your care-partner.  

## 2011-12-26 ENCOUNTER — Telehealth: Payer: Self-pay

## 2011-12-26 NOTE — Telephone Encounter (Signed)
Left a message on the pt's answering machine with her ID on it to call if any question or concern. Maw

## 2012-01-07 ENCOUNTER — Encounter: Payer: Self-pay | Admitting: Internal Medicine

## 2012-03-31 ENCOUNTER — Other Ambulatory Visit (INDEPENDENT_AMBULATORY_CARE_PROVIDER_SITE_OTHER): Payer: Medicare Other

## 2012-03-31 DIAGNOSIS — Z79899 Other long term (current) drug therapy: Secondary | ICD-10-CM

## 2012-03-31 DIAGNOSIS — Z0000 Encounter for general adult medical examination without abnormal findings: Secondary | ICD-10-CM

## 2012-03-31 LAB — BASIC METABOLIC PANEL
BUN: 17 mg/dL (ref 6–23)
CO2: 30 mEq/L (ref 19–32)
Calcium: 9.3 mg/dL (ref 8.4–10.5)
Chloride: 101 mEq/L (ref 96–112)
Creatinine, Ser: 0.7 mg/dL (ref 0.4–1.2)

## 2012-03-31 LAB — HEPATIC FUNCTION PANEL
Alkaline Phosphatase: 64 U/L (ref 39–117)
Bilirubin, Direct: 0.1 mg/dL (ref 0.0–0.3)
Total Bilirubin: 0.5 mg/dL (ref 0.3–1.2)
Total Protein: 7.4 g/dL (ref 6.0–8.3)

## 2012-03-31 LAB — CBC WITH DIFFERENTIAL/PLATELET
Basophils Absolute: 0.1 10*3/uL (ref 0.0–0.1)
Basophils Relative: 0.8 % (ref 0.0–3.0)
Eosinophils Absolute: 0.2 10*3/uL (ref 0.0–0.7)
Lymphocytes Relative: 40.1 % (ref 12.0–46.0)
MCHC: 33.4 g/dL (ref 30.0–36.0)
MCV: 89.6 fl (ref 78.0–100.0)
Monocytes Absolute: 0.6 10*3/uL (ref 0.1–1.0)
Neutrophils Relative %: 46.6 % (ref 43.0–77.0)
Platelets: 236 10*3/uL (ref 150.0–400.0)
RBC: 5.23 Mil/uL — ABNORMAL HIGH (ref 3.87–5.11)
RDW: 12.8 % (ref 11.5–14.6)

## 2012-03-31 LAB — POCT URINALYSIS DIPSTICK
Bilirubin, UA: NEGATIVE
Glucose, UA: NEGATIVE
Ketones, UA: NEGATIVE
Leukocytes, UA: NEGATIVE
Protein, UA: NEGATIVE
Spec Grav, UA: 1.01

## 2012-03-31 LAB — LIPID PANEL
Cholesterol: 181 mg/dL (ref 0–200)
LDL Cholesterol: 106 mg/dL — ABNORMAL HIGH (ref 0–99)
Triglycerides: 156 mg/dL — ABNORMAL HIGH (ref 0.0–149.0)
VLDL: 31.2 mg/dL (ref 0.0–40.0)

## 2012-04-06 ENCOUNTER — Other Ambulatory Visit: Payer: Self-pay | Admitting: Internal Medicine

## 2012-04-08 ENCOUNTER — Encounter: Payer: Self-pay | Admitting: Internal Medicine

## 2012-04-08 ENCOUNTER — Ambulatory Visit (INDEPENDENT_AMBULATORY_CARE_PROVIDER_SITE_OTHER): Payer: Medicare Other | Admitting: Internal Medicine

## 2012-04-08 VITALS — BP 136/72 | HR 85 | Temp 98.3°F | Ht 64.0 in | Wt 172.0 lb

## 2012-04-08 DIAGNOSIS — M899 Disorder of bone, unspecified: Secondary | ICD-10-CM

## 2012-04-08 DIAGNOSIS — H9190 Unspecified hearing loss, unspecified ear: Secondary | ICD-10-CM

## 2012-04-08 DIAGNOSIS — I252 Old myocardial infarction: Secondary | ICD-10-CM

## 2012-04-08 DIAGNOSIS — R7309 Other abnormal glucose: Secondary | ICD-10-CM

## 2012-04-08 DIAGNOSIS — Z0000 Encounter for general adult medical examination without abnormal findings: Secondary | ICD-10-CM | POA: Insufficient documentation

## 2012-04-08 DIAGNOSIS — Q602 Renal agenesis, unspecified: Secondary | ICD-10-CM

## 2012-04-08 DIAGNOSIS — M949 Disorder of cartilage, unspecified: Secondary | ICD-10-CM

## 2012-04-08 DIAGNOSIS — I2510 Atherosclerotic heart disease of native coronary artery without angina pectoris: Secondary | ICD-10-CM

## 2012-04-08 DIAGNOSIS — Z23 Encounter for immunization: Secondary | ICD-10-CM

## 2012-04-08 DIAGNOSIS — E785 Hyperlipidemia, unspecified: Secondary | ICD-10-CM

## 2012-04-08 DIAGNOSIS — Q605 Renal hypoplasia, unspecified: Secondary | ICD-10-CM

## 2012-04-08 MED ORDER — SIMVASTATIN 40 MG PO TABS: 40.0000 mg | ORAL_TABLET | Freq: Every day | ORAL | Status: DC

## 2012-04-08 NOTE — Patient Instructions (Addendum)
Continue lifestyle intervention healthy eating and exercise . Increase simvastatin to 40 per day  Get dexa scan will let you know result. Flu and prevnar  14 vaccine today. Lipids and hg a1c in 4 months or as needed.  And ov  Consider getting cardiology to see you again .  Will compare ekgs    See audiology  [facility], Female A healthy lifestyle and preventive care can promote health and wellness. Preventive health guidelines for women include the following key practices.  A routine yearly physical is a good way to check with your caregiver about your health and preventive screening. It is a chance to share any concerns and updates on your health, and to receive a thorough exam.  Visit your dentist for a routine exam and preventive care every 6 months. Brush your teeth twice a day and floss once a day. Good oral hygiene prevents tooth decay and gum disease.  The frequency of eye exams is based on your age, health, family medical history, use of contact lenses, and other factors. Follow your caregiver's recommendations for frequency of eye exams.  Eat a healthy diet. Foods like vegetables, fruits, whole grains, low-fat dairy products, and lean protein foods contain the nutrients you need without too many calories. Decrease your intake of foods high in solid fats, added sugars, and salt. Eat the right amount of calories for you.Get information about a proper diet from your caregiver, if necessary.  Regular physical exercise is one of the most important things you can do for your health. Most adults should get at least 150 minutes of moderate-intensity exercise (any activity that increases your heart rate and causes you to sweat) each week. In addition, most adults need muscle-strengthening exercises on 2 or more days a week.  Maintain a healthy weight. The body mass index (BMI) is a screening tool to identify possible weight problems. It provides an estimate  of body fat based on height and weight. Your caregiver can help determine your BMI, and can help you achieve or maintain a healthy weight.For adults 20 years and older:  A BMI below 18.5 is considered underweight.  A BMI of 18.5 to 24.9 is normal.  A BMI of 25 to 29.9 is considered overweight.  A BMI of 30 and above is considered obese.  Maintain normal blood lipids and cholesterol levels by exercising and minimizing your intake of saturated fat. Eat a balanced diet with plenty of fruit and vegetables. Blood tests for lipids and cholesterol should begin at age 31 and be repeated every 5 years. If your lipid or cholesterol levels are high, you are over 50, or you are at high risk for heart disease, you may need your cholesterol levels checked more frequently.Ongoing high lipid and cholesterol levels should be treated with medicines if diet and exercise are not effective.  If you smoke, find out from your caregiver how to quit. If you do not use tobacco, do not start.  If you are pregnant, do not drink alcohol. If you are breastfeeding, be very cautious about drinking alcohol. If you are not pregnant and choose to drink alcohol, do not exceed 1 drink per day. One drink is considered to be 12 ounces (355 mL) of beer, 5 ounces (148 mL) of wine, or 1.5 ounces (44 mL) of liquor.  Avoid use of street drugs. Do not share needles with anyone. Ask for help if you need support or instructions about stopping the  use of drugs.  High blood pressure causes heart disease and increases the risk of stroke. Your blood pressure should be checked at least every 1 to 2 years. Ongoing high blood pressure should be treated with medicines if weight loss and exercise are not effective.  If you are 3 to 65 years old, ask your caregiver if you should take aspirin to prevent strokes.  Diabetes screening involves taking a blood sample to check your fasting blood sugar level. This should be done once every 3 years, after  age 6, if you are within normal weight and without risk factors for diabetes. Testing should be considered at a younger age or be carried out more frequently if you are overweight and have at least 1 risk factor for diabetes.  Breast cancer screening is essential preventive care for women. You should practice "breast self-awareness." This means understanding the normal appearance and feel of your breasts and may include breast self-examination. Any changes detected, no matter how small, should be reported to a caregiver. Women in their 36s and 30s should have a clinical breast exam (CBE) by a caregiver as part of a regular health exam every 1 to 3 years. After age 67, women should have a CBE every year. Starting at age 2, women should consider having a mammography (breast X-ray test) every year. Women who have a family history of breast cancer should talk to their caregiver about genetic screening. Women at a high risk of breast cancer should talk to their caregivers about having magnetic resonance imaging (MRI) and a mammography every year.  The Pap test is a screening test for cervical cancer. A Pap test can show cell changes on the cervix that might become cervical cancer if left untreated. A Pap test is a procedure in which cells are obtained and examined from the lower end of the uterus (cervix).  Women should have a Pap test starting at age 62.  Between ages 84 and 55, Pap tests should be repeated every 2 years.  Beginning at age 14, you should have a Pap test every 3 years as long as the past 3 Pap tests have been normal.  Some women have medical problems that increase the chance of getting cervical cancer. Talk to your caregiver about these problems. It is especially important to talk to your caregiver if a new problem develops soon after your last Pap test. In these cases, your caregiver may recommend more frequent screening and Pap tests.  The above recommendations are the same for women  who have or have not gotten the vaccine for human papillomavirus (HPV).  If you had a hysterectomy for a problem that was not cancer or a condition that could lead to cancer, then you no longer need Pap tests. Even if you no longer need a Pap test, a regular exam is a good idea to make sure no other problems are starting.  If you are between ages 80 and 52, and you have had normal Pap tests going back 10 years, you no longer need Pap tests. Even if you no longer need a Pap test, a regular exam is a good idea to make sure no other problems are starting.  If you have had past treatment for cervical cancer or a condition that could lead to cancer, you need Pap tests and screening for cancer for at least 20 years after your treatment.  If Pap tests have been discontinued, risk factors (such as a new sexual partner) need to be  reassessed to determine if screening should be resumed.  The HPV test is an additional test that may be used for cervical cancer screening. The HPV test looks for the virus that can cause the cell changes on the cervix. The cells collected during the Pap test can be tested for HPV. The HPV test could be used to screen women aged 13 years and older, and should be used in women of any age who have unclear Pap test results. After the age of 38, women should have HPV testing at the same frequency as a Pap test.  Colorectal cancer can be detected and often prevented. Most routine colorectal cancer screening begins at the age of 71 and continues through age 13. However, your caregiver may recommend screening at an earlier age if you have risk factors for colon cancer. On a yearly basis, your caregiver may provide home test kits to check for hidden blood in the stool. Use of a small camera at the end of a tube, to directly examine the colon (sigmoidoscopy or colonoscopy), can detect the earliest forms of colorectal cancer. Talk to your caregiver about this at age 61, when routine screening  begins. Direct examination of the colon should be repeated every 5 to 10 years through age 37, unless early forms of pre-cancerous polyps or small growths are found.  Hepatitis C blood testing is recommended for all people born from [birth year] through 1965 and any individual with known risks for hepatitis C.  Practice safe sex. Use condoms and avoid high-risk sexual practices to reduce the spread of sexually transmitted infections (STIs). STIs include gonorrhea, chlamydia, syphilis, trichomonas, herpes, HPV, and human immunodeficiency virus (HIV). Herpes, HIV, and HPV are viral illnesses that have no cure. They can result in disability, cancer, and death. Sexually active women aged 60 and younger should be checked for chlamydia. Older women with new or multiple partners should also be tested for chlamydia. Testing for other STIs is recommended if you are sexually active and at increased risk.  Osteoporosis is a disease in which the bones lose minerals and strength with aging. This can result in serious bone fractures. The risk of osteoporosis can be identified using a bone density scan. Women ages 27 and over and women at risk for fractures or osteoporosis should discuss screening with their caregivers. Ask your caregiver whether you should take a calcium supplement or vitamin D to reduce the rate of osteoporosis.  Menopause can be associated with physical symptoms and risks. Hormone replacement therapy is available to decrease symptoms and risks. You should talk to your caregiver about whether hormone replacement therapy is right for you.  Use sunscreen with sun protection factor (SPF) of 30 or more. Apply sunscreen liberally and repeatedly throughout the day. You should seek shade when your shadow is shorter than you. Protect yourself by wearing long sleeves, pants, a wide-brimmed hat, and sunglasses year round, whenever you are outdoors.  Once a month, do a whole body skin exam, using a mirror to look at  the skin on your back. Notify your caregiver of new moles, moles that have irregular borders, moles that are larger than a pencil eraser, or moles that have changed in shape or color.  Stay current with required immunizations.  Influenza. You need a dose every fall (or winter). The composition of the flu vaccine changes each year, so being vaccinated once is not enough.  Pneumococcal polysaccharide. You need 1 to 2 doses if you smoke cigarettes or if you have  certain chronic medical conditions. You need 1 dose at age 76 (or older) if you have never been vaccinated.  Tetanus, diphtheria, pertussis (Tdap, Td). Get 1 dose of Tdap vaccine if you are younger than age 49, are over 41 and have contact with an infant, are a Dietitian, are pregnant, or simply want to be protected from whooping cough. After that, you need a Td booster dose every 10 years. Consult your caregiver if you have not had at least 3 tetanus and diphtheria-containing shots sometime in your life or have a deep or dirty wound.  HPV. You need this vaccine if you are a woman age 31 or younger. The vaccine is given in 3 doses over 6 months.  Measles, mumps, rubella (MMR). You need at least 1 dose of MMR if you were born in 1957 or later. You may also need a second dose.  Meningococcal. If you are age 20 to 80 and a first-year college student living in a residence hall, or have one of several medical conditions, you need to get vaccinated against meningococcal disease. You may also need additional booster doses.  Zoster (shingles). If you are age 15 or older, you should get this vaccine.  Varicella (chickenpox). If you have never had chickenpox or you were vaccinated but received only 1 dose, talk to your caregiver to find out if you need this vaccine.  Hepatitis A. You need this vaccine if you have a specific risk factor for hepatitis A virus infection or you simply wish to be protected from this disease. The vaccine is usually  given as 2 doses, 6 to 18 months apart.  Hepatitis B. You need this vaccine if you have a specific risk factor for hepatitis B virus infection or you simply wish to be protected from this disease. The vaccine is given in 3 doses, usually over 6 months. Preventive Services / Frequency Ages 92 to 12  Blood pressure check.** / Every 1 to 2 years.  Lipid and cholesterol check.** / Every 5 years beginning at age 64.  Clinical breast exam.** / Every 3 years for women in their 58s and 84s.  Pap test.** / Every 2 years from ages 87 through 75. Every 3 years starting at age 99 through age 4 or 45 with a history of 3 consecutive normal Pap tests.  HPV screening.** / Every 3 years from ages 60 through ages 21 to 93 with a history of 3 consecutive normal Pap tests.  Hepatitis C blood test.** / For any individual with known risks for hepatitis C.  Skin self-exam. / Monthly.  Influenza immunization.** / Every year.  Pneumococcal polysaccharide immunization.** / 1 to 2 doses if you smoke cigarettes or if you have certain chronic medical conditions.  Tetanus, diphtheria, pertussis (Tdap, Td) immunization. / A one-time dose of Tdap vaccine. After that, you need a Td booster dose every 10 years.  HPV immunization. / 3 doses over 6 months, if you are 31 and younger.  Measles, mumps, rubella (MMR) immunization. / You need at least 1 dose of MMR if you were born in 1957 or later. You may also need a second dose.  Meningococcal immunization. / 1 dose if you are age 64 to 2 and a first-year college student living in a residence hall, or have one of several medical conditions, you need to get vaccinated against meningococcal disease. You may also need additional booster doses.  Varicella immunization.** / Consult your caregiver.  Hepatitis A immunization.** / Consult your  caregiver. 2 doses, 6 to 18 months apart.  Hepatitis B immunization.** / Consult your caregiver. 3 doses usually over 6  months. Ages 35 to 38  Blood pressure check.** / Every 1 to 2 years.  Lipid and cholesterol check.** / Every 5 years beginning at age 19.  Clinical breast exam.** / Every year after age 97.  Mammogram.** / Every year beginning at age 56 and continuing for as long as you are in good health. Consult with your caregiver.  Pap test.** / Every 3 years starting at age 58 through age 22 or 50 with a history of 3 consecutive normal Pap tests.  HPV screening.** / Every 3 years from ages 16 through ages 74 to 72 with a history of 3 consecutive normal Pap tests.  Fecal occult blood test (FOBT) of stool. / Every year beginning at age 54 and continuing until age 78. You may not need to do this test if you get a colonoscopy every 10 years.  Flexible sigmoidoscopy or colonoscopy.** / Every 5 years for a flexible sigmoidoscopy or every 10 years for a colonoscopy beginning at age 8 and continuing until age 48.  Hepatitis C blood test.** / For all people born from [birth year] through 1964 and any individual with known risks for hepatitis C.  Skin self-exam. / Monthly.  Influenza immunization.** / Every year.  Pneumococcal polysaccharide immunization.** / 1 to 2 doses if you smoke cigarettes or if you have certain chronic medical conditions.  Tetanus, diphtheria, pertussis (Tdap, Td) immunization.** / A one-time dose of Tdap vaccine. After that, you need a Td booster dose every 10 years.  Measles, mumps, rubella (MMR) immunization. / You need at least 1 dose of MMR if you were born in 1957 or later. You may also need a second dose.  Varicella immunization.** / Consult your caregiver.  Meningococcal immunization.** / Consult your caregiver.  Hepatitis A immunization.** / Consult your caregiver. 2 doses, 6 to 18 months apart.  Hepatitis B immunization.** / Consult your caregiver. 3 doses, usually over 6 months. Ages 80 and over  Blood pressure check.** / Every 1 to 2 years.  Lipid and cholesterol  check.** / Every 5 years beginning at age 33.  Clinical breast exam.** / Every year after age 58.  Mammogram.** / Every year beginning at age 71 and continuing for as long as you are in good health. Consult with your caregiver.  Pap test.** / Every 3 years starting at age 2 through age 37 or 49 with a 3 consecutive normal Pap tests. Testing can be stopped between 65 and 70 with 3 consecutive normal Pap tests and no abnormal Pap or HPV tests in the past 10 years.  HPV screening.** / Every 3 years from ages 80 through ages 104 or 18 with a history of 3 consecutive normal Pap tests. Testing can be stopped between 65 and 70 with 3 consecutive normal Pap tests and no abnormal Pap or HPV tests in the past 10 years.  Fecal occult blood test (FOBT) of stool. / Every year beginning at age 24 and continuing until age 20. You may not need to do this test if you get a colonoscopy every 10 years.  Flexible sigmoidoscopy or colonoscopy.** / Every 5 years for a flexible sigmoidoscopy or every 10 years for a colonoscopy beginning at age 25 and continuing until age 48.  Hepatitis C blood test.** / For all people born from [birth year] through 1964 and any individual with known risks for hepatitis C.  Osteoporosis screening.** / A one-time screening for women ages 50 and over and women at risk for fractures or osteoporosis.  Skin self-exam. / Monthly.  Influenza immunization.** / Every year.  Pneumococcal polysaccharide immunization.** / 1 dose at age 23 (or older) if you have never been vaccinated.  Tetanus, diphtheria, pertussis (Tdap, Td) immunization. / A one-time dose of Tdap vaccine if you are over 65 and have contact with an infant, are a Dietitian, or simply want to be protected from whooping cough. After that, you need a Td booster dose every 10 years.  Varicella immunization.** / Consult your caregiver.  Meningococcal immunization.** / Consult your caregiver.  Hepatitis A immunization.** /  Consult your caregiver. 2 doses, 6 to 18 months apart.  Hepatitis B immunization.** / Check with your caregiver. 3 doses, usually over 6 months. ** Family history and personal history of risk and conditions may change your caregiver's recommendations. Document Released: 07/09/2001 Document Revised: 08/05/2011 Document Reviewed: 10/08/2010 [facility] Patient Information 2013 [place].

## 2012-04-08 NOTE — Progress Notes (Signed)
Chief Complaint  Patient presents with  . Annual Exam    Medicare    HPI: Patient comes in today for Preventive Medicare wellness visit . She is now 66 and on medicare .  Has had colonoscopy . No major changes in health since last visit No major injuries, ed visits ,hospitalizations , new medications since last visit. DM  Controlled  A bit of weight gain but still eating well  Feels well.  CAD no cp sob exercise change or use of ntg  Taking asa no bleeding. hasnt seen cards in a few years lost to card when  Her primary cardiologist left the practice and money was a consideration.  On metoprolol . LIPIDS no se of simva noted . Daughter age 65 had mi and stent and stopped tobacco and lost weight and eating well Care teams updated   Hearing:  Needs hearing test.   ocass ringing   Vision:  No limitations at present . Had eye check recently   Safety:  Has smoke detector and wears seat belts.  No firearms. No excess sun exposure. Sees dentist regularly.  Falls: NONE  Advance directive :  Reviewed    Memory: Felt to be good  , no concern from her or her family.  Depression: No anhedonia unusual crying or depressive symptoms  Nutrition: Eats well balanced diet; adequate calcium and vitamin D. No swallowing chewiing problems.  Injury: no major injuries in the last six months.  Other healthcare providers:  Reviewed today .  Social:  Lives alone  No pets.   Preventive parameters: up-to-date on colonoscopy, mammogram, immunizations. Including Tdap and pneumovax.  ADLS:   There are no problems or need for assistance  driving, feeding, obtaining food, dressing, toileting and bathing, managing money using phone. She is independent.  Exercises walk treadmill 4 x per week  no tobacco etoh less than one a day    ROS:  GEN/ HEENT: No fever, significant weight changes sweats headaches vision problems  changes, CV/ PULM; No chest pain shortness of breath cough, syncope,edema  change in  exercise tolerance. GI /GU: No adominal pain, vomiting, change in bowel habits. No blood in the stool. No significant GU symptoms. SKIN/HEME: ,no acute skin rashes suspicious lesions or bleeding. No lymphadenopathy, nodules, masses.  NEURO/ PSYCH:  No neurologic signs such as weakness numbness. No depression anxiety. IMM/ Allergy: No unusual infections.  Allergy .   REST of 12 system review negative except as per HPI   Past Medical History  Diagnosis Date  . Myocardial infarction     anterior stemi, hx of 2002 stent in front artery of her heart. 95% lad with intervention last stress test 2006 nl lv function hypertensive reponse subendocardial scar  . Hypertension   . Hyperlipidemia   . Gout   . Osteopenia   . Osteopenia   . Solitary kidney   . ASCUS on Pap smear     had repeat x 2 normal with [name] Plan 2011    Family History  Problem Relation Age of Onset  . Breast cancer Mother   . Lung cancer Father   . Heart attack Brother     daughter  age 77   . Colon cancer Brother   . Heart disease Daughter 1    smoker and overweight     History   Social History  . Marital Status: Married    Spouse Name: N/A    Number of Children: N/A  . Years of Education: N/A  Social History Main Topics  . Smoking status: Former Research scientist (life sciences)  . Smokeless tobacco: None     Comment: 37 years  . Alcohol Use: Yes     Comment: 1-2 per month  . Drug Use: No  . Sexually Active:    Other Topics Concern  . 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 aspirin 325 MG tablet Take 325 mg by mouth daily.        . Ergocalciferol (VITAMIN D2) 400 UNITS TABS Take by mouth.        . fish oil-omega-3 fatty acids 1000 MG capsule Take 2 g by mouth daily.        . metFORMIN (GLUCOPHAGE) 500 MG tablet TAKE ONE TABLET BY MOUTH TWICE A  DAY WITH MEALS  60 tablet  1  . metoprolol tartrate (LOPRESSOR) 25 MG tablet TAKE ONE-HALF TABLET BY MOUTH TWICE DAILY  90 tablet  1  . MULTIPLE VITAMIN PO Take by mouth.        . nitroGLYCERIN (NITROSTAT) 0.4 MG SL tablet Place 0.4 mg under the tongue every 5 (five) minutes as needed.        . [DISCONTINUED] simvastatin (ZOCOR) 20 MG tablet TAKE 1 TABLET BY MOUTH EVERY EVENING.  30 tablet  1  . simvastatin (ZOCOR) 40 MG tablet Take 1 tablet (40 mg total) by mouth at bedtime.  90 tablet  3    EXAM:  BP 136/72  Pulse 85  Temp 98.3 F (36.8 C) (Oral)  Ht _0  (1.626 m)  Wt 172 lb (78.019 kg)  BMI 29.52 kg/m2  SpO2 95%  Body mass index is 29.52 kg/(m^2).  Physical Exam: Vital signs reviewed MBT:DHRC is a well-developed well-nourished alert cooperative   female who appears her stated age in no acute distress.  HEENT: normocephalic atraumatic , Eyes: PERRL EOM's full, conjunctiva clear, Nares: paten,t no deformity discharge or tenderness., Ears: no deformity EAC's clear TMs with normal landmarks. Mouth: clear OP, no lesions, edema.  Moist mucous membranes. Dentition in adequate repair. NECK: supple without masses, thyromegaly or bruits. CHEST/PULM:  Clear to auscultation and percussion breath sounds equal no wheeze , rales or rhonchi. No chest wall deformities or tenderness. Some kyphosis  CV: PMI is nondisplaced, S1 S2 no gallops, murmurs, rubs. Peripheral pulses are full without delay.No JVD .  Breast: normal by inspection . No dimpling, discharge, masses, tenderness or discharge . ABDOMEN: Bowel sounds normal nontender  No guard or rebound, no hepato splenomegal no CVA tenderness.  No hernia. Extremtities:  No clubbing cyanosis or edema, no acute joint swelling or redness no focal atrophy NEURO:  Oriented x3, cranial nerves 3-12 appear to be intact, no obvious focal weakness,gait within normal limits no abnormal reflexes or asymmetrical SKIN: No acute rashes normal turgor, color, no  bruising or petechiae. PSYCH: Oriented, good eye contact, no obvious depression anxiety, cognition and judgment appear normal. LN: no cervical axillary inguinal adenopathy Pelvic per gyne   Lab Results  Component Value Date   WBC 6.3 03/31/2012   HGB 15.7* 03/31/2012   HCT 46.8* 03/31/2012   PLT 236.0 03/31/2012   GLUCOSE 90 03/31/2012   CHOL 181 03/31/2012   TRIG 156.0* 03/31/2012   HDL 43.60 03/31/2012   LDLCALC 106* 03/31/2012   ALT 13 03/31/2012   AST 17 03/31/2012   NA 140 03/31/2012   K  3.8 03/31/2012   CL 101 03/31/2012   CREATININE 0.7 03/31/2012   BUN 17 03/31/2012   CO2 30 03/31/2012   TSH 1.90 03/31/2012   HGBA1C 6.0 04/22/2011   EKG no sig change  Sinus large p waves and q in anteroseptal leads sinus rhythym.  ASSESSMENT AND PLAN:  Discussed the following assessment and plan:  1. Welcome to Medicare preventive visit  EKG 12-Lead   utd prevnar 13 today, gyne does pap  2. Other and unspecified hyperlipidemia  EKG 12-Lead   could be better ldl intensify med simva for now cw new guidlines alos  observe for se   3. MYOCARDIAL INFARCTION, HX OF  EKG 12-Lead, Ambulatory referral to Cardiology  4. CORONARY ARTERY DISEASE  EKG 12-Lead, Ambulatory referral to Cardiology   stable may need reassesment has been well and waited until medicare before getting back to cardiologist.  abnormal ekg will arrange to set up with a new cards  5. Other abnormal glucose     daibetes pre diabetes controlled with weight loss and ,metformincontinue  6. OSTEOPENIA  DG Bone Density   dexa when due  7. Need for pneumococcal vaccination  Pneumococcal conjugate vaccine 13-valent less than 5yo IM  8. Need for prophylactic vaccination and inoculation against influenza    9. SOLITARY KIDNEY    10. Decreased hearing     per pt  she will see audiologist for eval.   If bp elevated would add acei    Patient Instructions  Continue lifestyle intervention healthy eating and exercise . Increase simvastatin to 40  per day  Get dexa scan will let you know result. Flu and prevnar  13 vaccine today. Lipids and hg a1c in 4 months or as needed.  And ov  Consider getting cardiology to see you again .  Will compare ekgs    See audiology  [place] for Adults, Female A healthy lifestyle and preventive care can promote health and wellness. Preventive health guidelines for women include the following key practices.  A routine yearly physical is a good way to check with your caregiver about your health and preventive screening. It is a chance to share any concerns and updates on your health, and to receive a thorough exam.  Visit your dentist for a routine exam and preventive care every 6 months. Brush your teeth twice a day and floss once a day. Good oral hygiene prevents tooth decay and gum disease.  The frequency of eye exams is based on your age, health, family medical history, use of contact lenses, and other factors. Follow your caregiver's recommendations for frequency of eye exams.  Eat a healthy diet. Foods like vegetables, fruits, whole grains, low-fat dairy products, and lean protein foods contain the nutrients you need without too many calories. Decrease your intake of foods high in solid fats, added sugars, and salt. Eat the right amount of calories for you.Get information about a proper diet from your caregiver, if necessary.  Regular physical exercise is one of the most important things you can do for your health. Most adults should get at least 150 minutes of moderate-intensity exercise (any activity that increases your heart rate and causes you to sweat) each week. In addition, most adults need muscle-strengthening exercises on 2 or more days a week.  Maintain a healthy weight. The body mass index (BMI) is a screening tool to identify possible weight problems. It provides an estimate of body fat based on height  and weight. Your caregiver can help determine your BMI, and  can help you achieve or maintain a healthy weight.For adults 20 years and older:  A BMI below 18.5 is considered underweight.  A BMI of 18.5 to 24.9 is normal.  A BMI of 25 to 29.9 is considered overweight.  A BMI of 30 and above is considered obese.  Maintain normal blood lipids and cholesterol levels by exercising and minimizing your intake of saturated fat. Eat a balanced diet with plenty of fruit and vegetables. Blood tests for lipids and cholesterol should begin at age 28 and be repeated every 5 years. If your lipid or cholesterol levels are high, you are over 50, or you are at high risk for heart disease, you may need your cholesterol levels checked more frequently.Ongoing high lipid and cholesterol levels should be treated with medicines if diet and exercise are not effective.  If you smoke, find out from your caregiver how to quit. If you do not use tobacco, do not start.  If you are pregnant, do not drink alcohol. If you are breastfeeding, be very cautious about drinking alcohol. If you are not pregnant and choose to drink alcohol, do not exceed 1 drink per day. One drink is considered to be 12 ounces (355 mL) of beer, 5 ounces (148 mL) of wine, or 1.5 ounces (44 mL) of liquor.  Avoid use of street drugs. Do not share needles with anyone. Ask for help if you need support or instructions about stopping the use of drugs.  High blood pressure causes heart disease and increases the risk of stroke. Your blood pressure should be checked at least every 1 to 2 years. Ongoing high blood pressure should be treated with medicines if weight loss and exercise are not effective.  If you are 76 to 65 years old, ask your caregiver if you should take aspirin to prevent strokes.  Diabetes screening involves taking a blood sample to check your fasting blood sugar level. This should be done once every 3 years, after age 48, if you are within normal weight and without risk factors for diabetes. Testing  should be considered at a younger age or be carried out more frequently if you are overweight and have at least 1 risk factor for diabetes.  Breast cancer screening is essential preventive care for women. You should practice "breast self-awareness." This means understanding the normal appearance and feel of your breasts and may include breast self-examination. Any changes detected, no matter how small, should be reported to a caregiver. Women in their 23s and 30s should have a clinical breast exam (CBE) by a caregiver as part of a regular health exam every 1 to 3 years. After age 49, women should have a CBE every year. Starting at age 61, women should consider having a mammography (breast X-ray test) every year. Women who have a family history of breast cancer should talk to their caregiver about genetic screening. Women at a high risk of breast cancer should talk to their caregivers about having magnetic resonance imaging (MRI) and a mammography every year.  The Pap test is a screening test for cervical cancer. A Pap test can show cell changes on the cervix that might become cervical cancer if left untreated. A Pap test is a procedure in which cells are obtained and examined from the lower end of the uterus (cervix).  Women should have a Pap test starting at age 53.  Between ages 58 and 72, Pap tests should be  repeated every 2 years.  Beginning at age 9, you should have a Pap test every 3 years as long as the past 3 Pap tests have been normal.  Some women have medical problems that increase the chance of getting cervical cancer. Talk to your caregiver about these problems. It is especially important to talk to your caregiver if a new problem develops soon after your last Pap test. In these cases, your caregiver may recommend more frequent screening and Pap tests.  The above recommendations are the same for women who have or have not gotten the vaccine for human papillomavirus (HPV).  If you had a  hysterectomy for a problem that was not cancer or a condition that could lead to cancer, then you no longer need Pap tests. Even if you no longer need a Pap test, a regular exam is a good idea to make sure no other problems are starting.  If you are between ages 8 and 69, and you have had normal Pap tests going back 10 years, you no longer need Pap tests. Even if you no longer need a Pap test, a regular exam is a good idea to make sure no other problems are starting.  If you have had past treatment for cervical cancer or a condition that could lead to cancer, you need Pap tests and screening for cancer for at least 20 years after your treatment.  If Pap tests have been discontinued, risk factors (such as a new sexual partner) need to be reassessed to determine if screening should be resumed.  The HPV test is an additional test that may be used for cervical cancer screening. The HPV test looks for the virus that can cause the cell changes on the cervix. The cells collected during the Pap test can be tested for HPV. The HPV test could be used to screen women aged 30 years and older, and should be used in women of any age who have unclear Pap test results. After the age of 30, women should have HPV testing at the same frequency as a Pap test.  Colorectal cancer can be detected and often prevented. Most routine colorectal cancer screening begins at the age of 77 and continues through age 22. However, your caregiver may recommend screening at an earlier age if you have risk factors for colon cancer. On a yearly basis, your caregiver may provide home test kits to check for hidden blood in the stool. Use of a small camera at the end of a tube, to directly examine the colon (sigmoidoscopy or colonoscopy), can detect the earliest forms of colorectal cancer. Talk to your caregiver about this at age 29, when routine screening begins. Direct examination of the colon should be repeated every 5 to 10 years through age  70, unless early forms of pre-cancerous polyps or small growths are found.  Hepatitis C blood testing is recommended for all people born from [birth year] through 1965 and any individual with known risks for hepatitis C.  Practice safe sex. Use condoms and avoid high-risk sexual practices to reduce the spread of sexually transmitted infections (STIs). STIs include gonorrhea, chlamydia, syphilis, trichomonas, herpes, HPV, and human immunodeficiency virus (HIV). Herpes, HIV, and HPV are viral illnesses that have no cure. They can result in disability, cancer, and death. Sexually active women aged 82 and younger should be checked for chlamydia. Older women with new or multiple partners should also be tested for chlamydia. Testing for other STIs is recommended if you are sexually active  and at increased risk.  Osteoporosis is a disease in which the bones lose minerals and strength with aging. This can result in serious bone fractures. The risk of osteoporosis can be identified using a bone density scan. Women ages 70 and over and women at risk for fractures or osteoporosis should discuss screening with their caregivers. Ask your caregiver whether you should take a calcium supplement or vitamin D to reduce the rate of osteoporosis.  Menopause can be associated with physical symptoms and risks. Hormone replacement therapy is available to decrease symptoms and risks. You should talk to your caregiver about whether hormone replacement therapy is right for you.  Use sunscreen with sun protection factor (SPF) of 30 or more. Apply sunscreen liberally and repeatedly throughout the day. You should seek shade when your shadow is shorter than you. Protect yourself by wearing long sleeves, pants, a wide-brimmed hat, and sunglasses year round, whenever you are outdoors.  Once a month, do a whole body skin exam, using a mirror to look at the skin on your back. Notify your caregiver of new moles, moles that have irregular borders,  moles that are larger than a pencil eraser, or moles that have changed in shape or color.  Stay current with required immunizations.  Influenza. You need a dose every fall (or winter). The composition of the flu vaccine changes each year, so being vaccinated once is not enough.  Pneumococcal polysaccharide. You need 1 to 2 doses if you smoke cigarettes or if you have certain chronic medical conditions. You need 1 dose at age 26 (or older) if you have never been vaccinated.  Tetanus, diphtheria, pertussis (Tdap, Td). Get 1 dose of Tdap vaccine if you are younger than age 69, are over 34 and have contact with an infant, are a Dietitian, are pregnant, or simply want to be protected from whooping cough. After that, you need a Td booster dose every 10 years. Consult your caregiver if you have not had at least 3 tetanus and diphtheria-containing shots sometime in your life or have a deep or dirty wound.  HPV. You need this vaccine if you are a woman age 86 or younger. The vaccine is given in 3 doses over 6 months.  Measles, mumps, rubella (MMR). You need at least 1 dose of MMR if you were born in 1957 or later. You may also need a second dose.  Meningococcal. If you are age 42 to 41 and a first-year college student living in a residence hall, or have one of several medical conditions, you need to get vaccinated against meningococcal disease. You may also need additional booster doses.  Zoster (shingles). If you are age 21 or older, you should get this vaccine.  Varicella (chickenpox). If you have never had chickenpox or you were vaccinated but received only 1 dose, talk to your caregiver to find out if you need this vaccine.  Hepatitis A. You need this vaccine if you have a specific risk factor for hepatitis A virus infection or you simply wish to be protected from this disease. The vaccine is usually given as 2 doses, 6 to 18 months apart.  Hepatitis B. You need this vaccine if you have a  specific risk factor for hepatitis B virus infection or you simply wish to be protected from this disease. The vaccine is given in 3 doses, usually over 6 months. Preventive Services / Frequency Ages 50 to 48  Blood pressure check.** / Every 1 to 2 years.  Lipid  and cholesterol check.** / Every 5 years beginning at age 55.  Clinical breast exam.** / Every 3 years for women in their 2s and 64s.  Pap test.** / Every 2 years from ages 12 through 75. Every 3 years starting at age 54 through age 22 or 84 with a history of 3 consecutive normal Pap tests.  HPV screening.** / Every 3 years from ages 11 through ages 51 to 83 with a history of 3 consecutive normal Pap tests.  Hepatitis C blood test.** / For any individual with known risks for hepatitis C.  Skin self-exam. / Monthly.  Influenza immunization.** / Every year.  Pneumococcal polysaccharide immunization.** / 1 to 2 doses if you smoke cigarettes or if you have certain chronic medical conditions.  Tetanus, diphtheria, pertussis (Tdap, Td) immunization. / A one-time dose of Tdap vaccine. After that, you need a Td booster dose every 10 years.  HPV immunization. / 3 doses over 6 months, if you are 15 and younger.  Measles, mumps, rubella (MMR) immunization. / You need at least 1 dose of MMR if you were born in 1957 or later. You may also need a second dose.  Meningococcal immunization. / 1 dose if you are age 48 to 36 and a first-year college student living in a residence hall, or have one of several medical conditions, you need to get vaccinated against meningococcal disease. You may also need additional booster doses.  Varicella immunization.** / Consult your caregiver.  Hepatitis A immunization.** / Consult your caregiver. 2 doses, 6 to 18 months apart.  Hepatitis B immunization.** / Consult your caregiver. 3 doses usually over 6 months. Ages 20 to 71  Blood pressure check.** / Every 1 to 2 years.  Lipid and cholesterol  check.** / Every 5 years beginning at age 65.  Clinical breast exam.** / Every year after age 64.  Mammogram.** / Every year beginning at age 22 and continuing for as long as you are in good health. Consult with your caregiver.  Pap test.** / Every 3 years starting at age 40 through age 29 or 37 with a history of 3 consecutive normal Pap tests.  HPV screening.** / Every 3 years from ages 60 through ages 25 to 66 with a history of 3 consecutive normal Pap tests.  Fecal occult blood test (FOBT) of stool. / Every year beginning at age 42 and continuing until age 42. You may not need to do this test if you get a colonoscopy every 10 years.  Flexible sigmoidoscopy or colonoscopy.** / Every 5 years for a flexible sigmoidoscopy or every 10 years for a colonoscopy beginning at age 75 and continuing until age 18.  Hepatitis C blood test.** / For all people born from [birth year] through 1964 and any individual with known risks for hepatitis C.  Skin self-exam. / Monthly.  Influenza immunization.** / Every year.  Pneumococcal polysaccharide immunization.** / 1 to 2 doses if you smoke cigarettes or if you have certain chronic medical conditions.  Tetanus, diphtheria, pertussis (Tdap, Td) immunization.** / A one-time dose of Tdap vaccine. After that, you need a Td booster dose every 10 years.  Measles, mumps, rubella (MMR) immunization. / You need at least 1 dose of MMR if you were born in 1957 or later. You may also need a second dose.  Varicella immunization.** / Consult your caregiver.  Meningococcal immunization.** / Consult your caregiver.  Hepatitis A immunization.** / Consult your caregiver. 2 doses, 6 to 18 months apart.  Hepatitis B immunization.** /  Consult your caregiver. 3 doses, usually over 6 months. Ages 53 and over  Blood pressure check.** / Every 1 to 2 years.  Lipid and cholesterol check.** / Every 5 years beginning at age 43.  Clinical breast exam.** / Every year after age  60.  Mammogram.** / Every year beginning at age 71 and continuing for as long as you are in good health. Consult with your caregiver.  Pap test.** / Every 3 years starting at age 24 through age 56 or 79 with a 3 consecutive normal Pap tests. Testing can be stopped between 65 and 70 with 3 consecutive normal Pap tests and no abnormal Pap or HPV tests in the past 10 years.  HPV screening.** / Every 3 years from ages 68 through ages 60 or 69 with a history of 3 consecutive normal Pap tests. Testing can be stopped between 65 and 70 with 3 consecutive normal Pap tests and no abnormal Pap or HPV tests in the past 10 years.  Fecal occult blood test (FOBT) of stool. / Every year beginning at age 12 and continuing until age 53. You may not need to do this test if you get a colonoscopy every 10 years.  Flexible sigmoidoscopy or colonoscopy.** / Every 5 years for a flexible sigmoidoscopy or every 10 years for a colonoscopy beginning at age 38 and continuing until age 26.  Hepatitis C blood test.** / For all people born from [birth year] through 1964 and any individual with known risks for hepatitis C.  Osteoporosis screening.** / A one-time screening for women ages 68 and over and women at risk for fractures or osteoporosis.  Skin self-exam. / Monthly.  Influenza immunization.** / Every year.  Pneumococcal polysaccharide immunization.** / 1 dose at age 88 (or older) if you have never been vaccinated.  Tetanus, diphtheria, pertussis (Tdap, Td) immunization. / A one-time dose of Tdap vaccine if you are over 65 and have contact with an infant, are a Dietitian, or simply want to be protected from whooping cough. After that, you need a Td booster dose every 10 years.  Varicella immunization.** / Consult your caregiver.  Meningococcal immunization.** / Consult your caregiver.  Hepatitis A immunization.** / Consult your caregiver. 2 doses, 6 to 18 months apart.  Hepatitis B immunization.** / Check with  your caregiver. 3 doses, usually over 6 months. ** Family history and personal history of risk and conditions may change your caregiver's recommendations. Document Released: 07/09/2001 Document Revised: 08/05/2011 Document Reviewed: 10/08/2010 [facility] Patient Information 2013 [place].    Standley Brooking. Panosh M.D.

## 2012-04-12 ENCOUNTER — Encounter: Payer: Self-pay | Admitting: Internal Medicine

## 2012-04-12 DIAGNOSIS — H9190 Unspecified hearing loss, unspecified ear: Secondary | ICD-10-CM | POA: Insufficient documentation

## 2012-05-04 ENCOUNTER — Institutional Professional Consult (permissible substitution): Payer: Medicare Other | Admitting: Cardiovascular Disease

## 2012-05-31 ENCOUNTER — Other Ambulatory Visit: Payer: Self-pay | Admitting: Internal Medicine

## 2012-06-04 ENCOUNTER — Encounter: Payer: Self-pay | Admitting: Internal Medicine

## 2012-06-04 ENCOUNTER — Ambulatory Visit (INDEPENDENT_AMBULATORY_CARE_PROVIDER_SITE_OTHER): Payer: Medicare Other | Admitting: Internal Medicine

## 2012-06-04 ENCOUNTER — Ambulatory Visit (INDEPENDENT_AMBULATORY_CARE_PROVIDER_SITE_OTHER)
Admission: RE | Admit: 2012-06-04 | Discharge: 2012-06-04 | Disposition: A | Discharge status: Home / Self Care | Payer: Medicare Other | Source: Ambulatory Visit | Attending: Internal Medicine | Admitting: Internal Medicine

## 2012-06-04 VITALS — BP 124/72 | HR 104 | Temp 98.7°F | Wt 166.0 lb

## 2012-06-04 DIAGNOSIS — J22 Unspecified acute lower respiratory infection: Secondary | ICD-10-CM

## 2012-06-04 DIAGNOSIS — J019 Acute sinusitis, unspecified: Secondary | ICD-10-CM

## 2012-06-04 DIAGNOSIS — J988 Other specified respiratory disorders: Secondary | ICD-10-CM

## 2012-06-04 MED ORDER — ALBUTEROL SULFATE (5 MG/ML) 0.5% IN NEBU
2.5000 mg | INHALATION_SOLUTION | Freq: Once | RESPIRATORY_TRACT | Status: AC
  Administered 2012-06-04: 2.5 mg via RESPIRATORY_TRACT

## 2012-06-04 MED ORDER — ALBUTEROL SULFATE HFA 108 (90 BASE) MCG/ACT IN AERS: 2.0000 | INHALATION_SPRAY | Freq: Four times a day (QID) | RESPIRATORY_TRACT | Status: DC | PRN

## 2012-06-04 MED ORDER — CEFUROXIME AXETIL 500 MG PO TABS: 500.0000 mg | ORAL_TABLET | Freq: Two times a day (BID) | ORAL | Status: DC

## 2012-06-04 NOTE — Patient Instructions (Addendum)
Get chest x-ray done today to make sure you don't have pneumonia.  We'll prescribe a different antibiotic depending on what the chest x-ray shows to treat the sinusitis or anything needed on the x-ray.  We'll send in a prescription for albuterol inhaler to use 2 puffs every 6 hours as needed for the tightness and wheezing. This is the same medicine that was in the nebulizer we gave you.  Expect improvement either way within 3-5 days. Contact us if this is not happening shortness of breath relapsing fever.  Using Your Inhaler Proper inhaler technique is very important. Good technique assures that the medicine reaches the lungs. Poor technique results in depositing the medicine on the tongue and back of the throat rather than in the airways. STEPS TO FOLLOW IF USING INHALER WITHOUT EXTENSION TUBE: 1. Remove cap from inhaler. 2. Shake inhaler for 5 seconds before each inhalation (breathing in). 3. Position the inhaler so that the top of the canister faces up. 4. Put your index finger on the top of the medication canister. Your thumb supports the bottom of the inhaler. 5. Open your mouth. 6. Hold the inhaler 1 to 2 inches away from your open mouth. This allows the medicine to slow down before the medicine enters the mouth. 7. Exhale (breathe out) normally and as completely as possible. 8. Press the canister down with the index finger to release the medication. 9. At the same time as the canister is pressed, inhale deeply and slowly until the lungs are completely filled. This should take 4 to 6 seconds. Keep your tongue down and out of the way. 10. Hold the medication in your lungs for up to 10 seconds (10 seconds is best). This helps the medicine get into the small airways of your lungs to work better. 11. Breathe out slowly, through pursed lips. Whistling is an example of pursed lips. 12. Wait at least 1 minute between puffs. Continue with the above steps until you have taken the number of puffs  your caregiver has ordered. 13. Replace cap on inhaler. STEPS TO FOLLOW USING AN INHALER WITH AN EXTENSION (SPACER): 1.  Remove cap from inhaler. 2. Shake inhaler for 5 seconds before each inhalation (breathing in). 3. Your caregiver has asked you to use a spacer with your inhaler. A spacer is a plastic tube with a mouthpiece on one end and an opening that connects to the inhaler on the other end. A spacer helps you take the medicine better. 4. Place the open end of the spacer onto the mouthpiece of the inhaler. 5. Position the inhaler so that the top of the canister faces up and the spacer mouthpiece faces you. 6. Put your index finger on the top of the medication canister. Your thumb supports the bottom of the inhaler and the spacer. 7. Exhale (breathe out) normally and as completely as possible. 8. Immediately after exhaling, place the spacer between your teeth and into your mouth. Close your mouth tightly around the spacer. 9. Press the canister down with the index finger to release the medication. 10. At the same time as the canister is pressed, inhale deeply and slowly until the lungs are completely filled. This should take 4 to 6 seconds. Keep your tongue down and out of the way. 11. Hold the medication in your lungs for up to 10 seconds (10 seconds is best). This helps the medicine get into the small airways of your lungs to work better. Exhale. 12. Repeat inhaling deeply through the spacer  mouthpiece. Again hold that breath for up to 10 seconds (10 seconds is best). Exhale slowly. If it is difficult to take this second deep breath through the spacer, breathe normally several times through the spacer. Remove the spacer from your mouth. 13. Wait at least 1 minute between puffs. Continue with the above steps until you have taken the number of puffs your caregiver has ordered. 14. Remove spacer from the inhaler and place cap on inhaler. If you are using different kinds of inhalers, use your  quick relief medicine to open the airways 10 - 15 minutes before using a steroid. If you are unsure which inhalers to use and the order of using them, ask your caregiver, nurse, or respiratory therapist. If you are using a steroid inhaler, rinse your mouth with water after your last puff and then spit out the water. Do not swallow the water. Avoid the following:  Inhaling before or after starting the spray of medicine. It takes practice to coordinate your breathing with triggering the spray.  Inhaling through the nose (rather than the mouth) when triggering the spray. HOW TO DETERMINE IF YOUR INHALER IS FULL OR NEARLY EMPTY:  Determine when an inhaler is empty. You cannot know when an inhaler is empty by shaking it. A few inhalers are now being made with dose counters. Ask your caregiver for a prescription that has a dose counter if you feel you need that extra help.  If your inhaler does not have a counter, check the number of doses in the inhaler before you use it. The canister or box will list the number of doses in the canister. Divide the total number of doses in the canister by the number you will use each day to find how many days the canister will last. (For example, if your canister has 200 doses and you take 2 puffs, 4 times each day, which is 8 puffs a day. Dividing 200 by 8 equals 25. The canister should last 25 days.) Using a calendar, count forward that many days to see when your inhaler will run out. Write the refill date on a calendar or your canister.  Remember, if you need to take extra doses, the inhaler will empty sooner than you figured. Be sure you have a refill before your canister runs out. Refill your inhaler 7 to 10 days before it runs out. HOME CARE INSTRUCTIONS   Do not use the inhaler more than your caregiver tells you. If you are still wheezing and are feeling tightness in your chest, call your caregiver.  Keep an adequate supply of medication. This includes making  sure the medicine is not expired, and you have a spare inhaler.  Follow your caregiver or inhaler insert directions for cleaning the inhaler and spacer. SEEK MEDICAL CARE IF:   Symptoms are only partially relieved with your inhaler.  You are having trouble using your inhaler.  You experience some increase in phlegm.  You develop a fever of 100.5 F (38.1 C). SEEK IMMEDIATE MEDICAL CARE IF:   You feel little or no relief with your inhalers. You are still wheezing and are feeling shortness of breath and/or tightness in your chest.  You have side effects such as dizziness, headaches or fast heart rate.  You have chills, fever, night sweats or an oral temperature above 102 F (38.9 C).  Phlegm production increases a lot, or there is blood in the phlegm. MAKE SURE YOU:   Understand these instructions.  Will watch your condition.  Will get help right away if you are not doing well or get worse. Document Released: 05/10/2000 Document Revised: 08/05/2011 Document Reviewed: 02/28/2009 [facility] Patient Information 2013 [place].

## 2012-06-04 NOTE — Progress Notes (Signed)
Chief Complaint  Patient presents with  . Cough    Ongoing for 6 wks.  Has taken 10 days of amoxicillian from the [facility].  Was feeling better.  . Nasal Congestion  . Headache  . Chills    HPI: Patient comes in today for SDA for  new problem evaluation. Onset  About 6 weeks ago  [facility]   was given am[facility] and some improvement  But not totally and still with sinus  pressure   Bifrontal pressure and drainage  Coughing increase  Using tylenol   Last dose 2 days ago.  Now nausea and sleeping increase.   Slightly better today.  ROS: See pertinent positives and negatives per HPI. No bleeding  Feels tired and nausea   Past Medical History  Diagnosis Date  . Myocardial infarction     anterior stemi, hx of 2002 stent in front artery of her heart. 95% lad with intervention last stress test 2006 nl lv function hypertensive reponse subendocardial scar  . Hypertension   . Hyperlipidemia   . Gout   . Osteopenia   . Osteopenia   . Solitary kidney   . ASCUS on Pap smear     had repeat x 2 normal with [name] Plan 2011    Family History  Problem Relation Age of Onset  . Breast cancer Mother   . Lung cancer Father   . Heart attack Brother     daughter  age 6   . Colon cancer Brother   . Heart disease Daughter 65    smoker and overweight     History   Social History  . Marital Status: Married    Spouse Name: N/A    Number of Children: N/A  . Years of Education: N/A   Social History Main Topics  . Smoking status: Former Research scientist (life sciences)  . Smokeless tobacco: None     Comment: 37 years  . Alcohol Use: Yes     Comment: 1-2 per month  . Drug Use: No  . Sexually Active:    Other Topics Concern  . 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 aspirin 325 MG tablet Take 325 mg by mouth daily.         . Ergocalciferol (VITAMIN D2) 400 UNITS TABS Take by mouth.        . fish oil-omega-3 fatty acids 1000 MG capsule Take 2 g by mouth daily.        . metFORMIN (GLUCOPHAGE) 500 MG tablet TAKE ONE TABLET BY MOUTH TWICE DAILY WITH MEALS  60 tablet  5  . metoprolol tartrate (LOPRESSOR) 25 MG tablet TAKE ONE-HALF TABLET BY MOUTH TWICE DAILY  90 tablet  1  . MULTIPLE VITAMIN PO Take by mouth.        . nitroGLYCERIN (NITROSTAT) 0.4 MG SL tablet Place 0.4 mg under the tongue every 5 (five) minutes as needed.        . simvastatin (ZOCOR) 40 MG tablet Take 1 tablet (40 mg total) by mouth at bedtime.  90 tablet  3  . albuterol (PROAIR HFA) 108 (90 BASE) MCG/ACT inhaler Inhale 2 puffs into the lungs every 6 (six) hours as needed for wheezing.  1 Inhaler  1  . cefUROXime (CEFTIN) 500 MG tablet Take 1 tablet (500 mg total) by mouth 2 (two) times  daily.  20 tablet  0  . albuterol (PROVENTIL) (5 MG/ML) 0.5% nebulizer solution 2.5 mg         EXAM:  BP 124/72  Pulse 104  Temp 98.7 F (37.1 C) (Oral)  Wt 166 lb (75.297 kg)  SpO2 96%  There is no height on file to calculate BMI.  WDWN in NAD  quiet respirations; mildly congested  somewhat hoarse. Non toxic . Very deep bronchial cough  Ill non toxic  HEENT: Normocephalic ;atraumatic , Eyes;  PERRL, EOMs  Full, lids and conjunctiva clear,,Ears: no deformities, canals nl, TM landmarks normal, Nose: no deformity or discharge but congested;face minimally tender  Bifrontal area  Mouth : OP clear without lesion or edema . Mild erythema  Neck: Supple without adenopathy or masses or bruits Chest:  Dec breath sounds   tight no rales   Nebulizer given  Much improved air flow and comfort   CV:  S1-S2 no gallops or murmurs peripheral perfusion is normal Skin :nl perfusion and no acute rashes   MS: moves all extremities without noticeable focal  abnormality  PSYCH: pleasant and cooperative, no obvious depression or anxiety  ASSESSMENT AND PLAN:  Discussed the  following assessment and plan:  1. Lower respiratory infection (e.g., bronchitis, pneumonia, pneumonitis, pulmonitis)  DG Chest 2 View, albuterol (PROVENTIL) (5 MG/ML) 0.5% nebulizer solution 2.5 mg   relaspsing ?sx  over this week after amox rx    2. Acute sinusitis treated with antibiotics in the past 60 days  DG Chest 2 View   c xranoad  Will rx ceftin 500 bid for 10 days .    Expectant management. Albuterol as needed  -Patient advised to return or notify health care team  immediately if symptoms worsen or persist or new concerns arise.  Patient Instructions  Get chest x-ray done today to make sure you don't have pneumonia.  We'll prescribe a different antibiotic depending on what the chest x-ray shows to treat the sinusitis or anything needed on the x-ray.  We'll send in a prescription for albuterol inhaler to use 2 puffs every 6 hours as needed for the tightness and wheezing. This is the same medicine that was in the nebulizer we gave you.  Expect improvement either way within 3-5 days. Contact us if this is not happening shortness of breath relapsing fever.  Using Your Inhaler Proper inhaler technique is very important. Good technique assures that the medicine reaches the lungs. Poor technique results in depositing the medicine on the tongue and back of the throat rather than in the airways. STEPS TO FOLLOW IF USING INHALER WITHOUT EXTENSION TUBE: 1. Remove cap from inhaler. 2. Shake inhaler for 5 seconds before each inhalation (breathing in). 3. Position the inhaler so that the top of the canister faces up. 4. Put your index finger on the top of the medication canister. Your thumb supports the bottom of the inhaler. 5. Open your mouth. 6. Hold the inhaler 1 to 2 inches away from your open mouth. This allows the medicine to slow down before the medicine enters the mouth. 7. Exhale (breathe out) normally and as completely as possible. 8. Press the canister down with the index finger  to release the medication. 9. At the same time as the canister is pressed, inhale deeply and slowly until the lungs are completely filled. This should take 4 to 6 seconds. Keep your tongue down and out of the way. 10. Hold the medication in your lungs for up to 10  seconds (10 seconds is best). This helps the medicine get into the small airways of your lungs to work better. 11. Breathe out slowly, through pursed lips. Whistling is an example of pursed lips. 12. Wait at least 1 minute between puffs. Continue with the above steps until you have taken the number of puffs your caregiver has ordered. 13. Replace cap on inhaler. STEPS TO FOLLOW USING AN INHALER WITH AN EXTENSION (SPACER): 1.  Remove cap from inhaler. 2. Shake inhaler for 5 seconds before each inhalation (breathing in). 3. Your caregiver has asked you to use a spacer with your inhaler. A spacer is a plastic tube with a mouthpiece on one end and an opening that connects to the inhaler on the other end. A spacer helps you take the medicine better. 4. Place the open end of the spacer onto the mouthpiece of the inhaler. 5. Position the inhaler so that the top of the canister faces up and the spacer mouthpiece faces you. 6. Put your index finger on the top of the medication canister. Your thumb supports the bottom of the inhaler and the spacer. 7. Exhale (breathe out) normally and as completely as possible. 8. Immediately after exhaling, place the spacer between your teeth and into your mouth. Close your mouth tightly around the spacer. 9. Press the canister down with the index finger to release the medication. 10. At the same time as the canister is pressed, inhale deeply and slowly until the lungs are completely filled. This should take 4 to 6 seconds. Keep your tongue down and out of the way. 11. Hold the medication in your lungs for up to 10 seconds (10 seconds is best). This helps the medicine get into the small airways of your lungs to  work better. Exhale. 12. Repeat inhaling deeply through the spacer mouthpiece. Again hold that breath for up to 10 seconds (10 seconds is best). Exhale slowly. If it is difficult to take this second deep breath through the spacer, breathe normally several times through the spacer. Remove the spacer from your mouth. 13. Wait at least 1 minute between puffs. Continue with the above steps until you have taken the number of puffs your caregiver has ordered. 14. Remove spacer from the inhaler and place cap on inhaler. If you are using different kinds of inhalers, use your quick relief medicine to open the airways 10 - 15 minutes before using a steroid. If you are unsure which inhalers to use and the order of using them, ask your caregiver, nurse, or respiratory therapist. If you are using a steroid inhaler, rinse your mouth with water after your last puff and then spit out the water. Do not swallow the water. Avoid the following:  Inhaling before or after starting the spray of medicine. It takes practice to coordinate your breathing with triggering the spray.  Inhaling through the nose (rather than the mouth) when triggering the spray. HOW TO DETERMINE IF YOUR INHALER IS FULL OR NEARLY EMPTY:  Determine when an inhaler is empty. You cannot know when an inhaler is empty by shaking it. A few inhalers are now being made with dose counters. Ask your caregiver for a prescription that has a dose counter if you feel you need that extra help.  If your inhaler does not have a counter, check the number of doses in the inhaler before you use it. The canister or box will list the number of doses in the canister. Divide the total number of doses in the  canister by the number you will use each day to find how many days the canister will last. (For example, if your canister has 200 doses and you take 2 puffs, 4 times each day, which is 8 puffs a day. Dividing 200 by 8 equals 25. The canister should last 25 days.) Using a  calendar, count forward that many days to see when your inhaler will run out. Write the refill date on a calendar or your canister.  Remember, if you need to take extra doses, the inhaler will empty sooner than you figured. Be sure you have a refill before your canister runs out. Refill your inhaler 7 to 10 days before it runs out. HOME CARE INSTRUCTIONS   Do not use the inhaler more than your caregiver tells you. If you are still wheezing and are feeling tightness in your chest, call your caregiver.  Keep an adequate supply of medication. This includes making sure the medicine is not expired, and you have a spare inhaler.  Follow your caregiver or inhaler insert directions for cleaning the inhaler and spacer. SEEK MEDICAL CARE IF:   Symptoms are only partially relieved with your inhaler.  You are having trouble using your inhaler.  You experience some increase in phlegm.  You develop a fever of 100.5 F (38.1 C). SEEK IMMEDIATE MEDICAL CARE IF:   You feel little or no relief with your inhalers. You are still wheezing and are feeling shortness of breath and/or tightness in your chest.  You have side effects such as dizziness, headaches or fast heart rate.  You have chills, fever, night sweats or an oral temperature above 102 F (38.9 C).  Phlegm production increases a lot, or there is blood in the phlegm. MAKE SURE YOU:   Understand these instructions.  Will watch your condition.  Will get help right away if you are not doing well or get worse. Document Released: 05/10/2000 Document Revised: 08/05/2011 Document Reviewed: 02/28/2009 [facility] Patient Information 2013 [place].      Standley Brooking. Evaline Waltman M.D.

## 2012-06-10 ENCOUNTER — Ambulatory Visit (INDEPENDENT_AMBULATORY_CARE_PROVIDER_SITE_OTHER)
Admission: RE | Admit: 2012-06-10 | Discharge: 2012-06-10 | Disposition: A | Discharge status: Home / Self Care | Payer: Medicare Other | Source: Ambulatory Visit

## 2012-06-10 DIAGNOSIS — M899 Disorder of bone, unspecified: Secondary | ICD-10-CM

## 2012-06-19 ENCOUNTER — Telehealth: Payer: Self-pay | Admitting: Family Medicine

## 2012-06-19 ENCOUNTER — Encounter: Payer: Self-pay | Admitting: Internal Medicine

## 2012-06-19 MED ORDER — ALBUTEROL SULFATE HFA 108 (90 BASE) MCG/ACT IN AERS: 2.0000 | INHALATION_SPRAY | Freq: Four times a day (QID) | RESPIRATORY_TRACT | Status: DC | PRN

## 2012-06-19 MED ORDER — PREDNISONE 20 MG PO TABS: ORAL_TABLET | ORAL | Status: DC

## 2012-06-19 NOTE — Telephone Encounter (Signed)
eRROR

## 2012-06-19 NOTE — Telephone Encounter (Signed)
I spoke to the pt.  She has completed her antibiotic.  She is not having any fever.  However, her sputum from the cough is darker in the morning and at night.  During the day all is clear.  Has post nasal at night that aggravates her cough.   She has also been using her inhaler to help with the cough and breathing (not really sob).  She would like a refill on her inhaler.  Please advise what to do next.  Thanks!!!

## 2012-08-05 ENCOUNTER — Other Ambulatory Visit (INDEPENDENT_AMBULATORY_CARE_PROVIDER_SITE_OTHER): Payer: Medicare Other

## 2012-08-05 DIAGNOSIS — R7989 Other specified abnormal findings of blood chemistry: Secondary | ICD-10-CM

## 2012-08-05 DIAGNOSIS — E785 Hyperlipidemia, unspecified: Secondary | ICD-10-CM

## 2012-08-05 DIAGNOSIS — E790 Hyperuricemia without signs of inflammatory arthritis and tophaceous disease: Secondary | ICD-10-CM

## 2012-08-05 LAB — HEMOGLOBIN A1C: Hgb A1c MFr Bld: 6 % (ref 4.6–6.5)

## 2012-08-05 LAB — LIPID PANEL: Total CHOL/HDL Ratio: 4

## 2012-08-12 ENCOUNTER — Ambulatory Visit (INDEPENDENT_AMBULATORY_CARE_PROVIDER_SITE_OTHER): Payer: Medicare Other | Admitting: Internal Medicine

## 2012-08-12 ENCOUNTER — Encounter: Payer: Self-pay | Admitting: Internal Medicine

## 2012-08-12 VITALS — BP 122/76 | HR 79 | Temp 97.7°F | Wt 176.0 lb

## 2012-08-12 DIAGNOSIS — R7309 Other abnormal glucose: Secondary | ICD-10-CM

## 2012-08-12 DIAGNOSIS — R7303 Prediabetes: Secondary | ICD-10-CM | POA: Insufficient documentation

## 2012-08-12 DIAGNOSIS — I2510 Atherosclerotic heart disease of native coronary artery without angina pectoris: Secondary | ICD-10-CM

## 2012-08-12 DIAGNOSIS — I10 Essential (primary) hypertension: Secondary | ICD-10-CM

## 2012-08-12 DIAGNOSIS — E785 Hyperlipidemia, unspecified: Secondary | ICD-10-CM

## 2012-08-12 NOTE — Progress Notes (Signed)
Chief Complaint  Patient presents with  . Follow-up    Lab results    HPI: Patient comes in today for follow up of   medical problems.  Had increased simva from last visit and no se  Better from sinus infection Inhaler  Helped As needed   Took a while  Has gained a bit of weight with illness and winter but back on the  intensified regimen diet.   ROS: See pertinent positives and negatives per HPI.  Past Medical History  Diagnosis Date  . Myocardial infarction     anterior stemi, hx of 2002 stent in front artery of her heart. 95% lad with intervention last stress test 2006 nl lv function hypertensive reponse subendocardial scar  . Hypertension   . Hyperlipidemia   . Gout   . Osteopenia   . Osteopenia   . Solitary kidney   . ASCUS on Pap smear     had repeat x 2 normal with [name] Plan 2011    Family History  Problem Relation Age of Onset  . Breast cancer Mother   . Lung cancer Father   . Heart attack Brother     daughter  age 52   . Colon cancer Brother   . Heart disease Daughter 2    smoker and overweight     History   Social History  . Marital Status: Married    Spouse Name: N/A    Number of Children: N/A  . Years of Education: N/A   Social History Main Topics  . Smoking status: Former Research scientist (life sciences)  . Smokeless tobacco: None     Comment: 37 years  . Alcohol Use: Yes     Comment: 1-2 per month  . Drug Use: No  . Sexually Active:    Other Topics Concern  . 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 albuterol (PROAIR HFA) 108 (90 BASE) MCG/ACT inhaler Inhale 2 puffs into the lungs every 6 (six) hours as needed for wheezing.  1 Inhaler  0  . aspirin 325 MG tablet Take 325 mg by mouth daily.        . Ergocalciferol (VITAMIN D2) 400 UNITS TABS Take by mouth.         . fish oil-omega-3 fatty acids 1000 MG capsule Take 2 g by mouth daily.        . metFORMIN (GLUCOPHAGE) 500 MG tablet TAKE ONE TABLET BY MOUTH TWICE DAILY WITH MEALS  60 tablet  5  . metoprolol tartrate (LOPRESSOR) 25 MG tablet TAKE ONE-HALF TABLET BY MOUTH TWICE DAILY  90 tablet  1  . MULTIPLE VITAMIN PO Take by mouth.        . nitroGLYCERIN (NITROSTAT) 0.4 MG SL tablet Place 0.4 mg under the tongue every 5 (five) minutes as needed.        . simvastatin (ZOCOR) 40 MG tablet Take 1 tablet (40 mg total) by mouth at bedtime.  90 tablet  3  . [DISCONTINUED] cefUROXime (CEFTIN) 500 MG tablet Take 1 tablet (500 mg total) by mouth 2 (two) times daily.  20 tablet  0  . [DISCONTINUED] predniSONE (DELTASONE) 20 MG tablet Take 9m for two days and 454mfor three days.  12  tablet  0   No facility-administered encounter medications on file as of 08/12/2012.    EXAM:  BP 122/76  Pulse 79  Temp(Src) 97.7 F (36.5 C) (Oral)  Wt 176 lb (79.833 kg)  BMI 30.2 kg/m2  SpO2 97%  Body mass index is 30.2 kg/(m^2). Wt Readings from Last 3 Encounters:  08/12/12 176 lb (79.833 kg)  06/04/12 166 lb (75.297 kg)  04/08/12 172 lb (78.019 kg)    GENERAL: vitals reviewed and listed above, alert, oriented, appears well hydrated and in no acute distress  HEENT: atraumatic, conjunctiva  clear, no obvious abnormalities on inspection of external nose and ears    PSYCH: pleasant and cooperative, no obvious depression or anxiety Lab Results  Component Value Date   WBC 6.3 03/31/2012   HGB 15.7* 03/31/2012   HCT 46.8* 03/31/2012   PLT 236.0 03/31/2012   GLUCOSE 90 03/31/2012   CHOL 159 08/05/2012   TRIG 94.0 08/05/2012   HDL 44.60 08/05/2012   LDLCALC 96 08/05/2012   ALT 13 03/31/2012   AST 17 03/31/2012   NA 140 03/31/2012   K 3.8 03/31/2012   CL 101 03/31/2012   CREATININE 0.7 03/31/2012   BUN 17 03/31/2012   CO2 30 03/31/2012   TSH 1.90 03/31/2012   HGBA1C 6.0 08/05/2012    ASSESSMENT AND PLAN:  Discussed  the following assessment and plan:  Other and unspecified hyperlipidemia - better  continue   Pre-diabetes - vs dm a1c 7 3 years ago controlled by metormin and weight loss   HYPERTENSION  CORONARY ARTERY DISEASE - stable   -Patient advised to return or notify health care team  if symptoms worsen or persist or new concerns arise.  Patient Instructions     Standley Brooking. Novice Vrba M.D. ehr says she needs immunization but  She had pna vaccine last fall ocumented  Will send in ticket to [employer] team

## 2012-08-20 ENCOUNTER — Other Ambulatory Visit: Payer: Self-pay | Admitting: Internal Medicine

## 2012-09-25 ENCOUNTER — Encounter: Payer: Self-pay | Admitting: Internal Medicine

## 2012-10-23 ENCOUNTER — Encounter: Payer: Self-pay | Admitting: Internal Medicine

## 2012-10-24 ENCOUNTER — Other Ambulatory Visit: Payer: Self-pay | Admitting: Internal Medicine

## 2012-10-24 MED ORDER — PREDNISONE 10 MG PO TABS: ORAL_TABLET | ORAL | Status: DC

## 2012-12-13 ENCOUNTER — Other Ambulatory Visit: Payer: Self-pay | Admitting: Internal Medicine

## 2012-12-30 ENCOUNTER — Other Ambulatory Visit: Payer: Self-pay

## 2013-03-14 ENCOUNTER — Other Ambulatory Visit: Payer: Self-pay | Admitting: Internal Medicine

## 2013-04-01 ENCOUNTER — Other Ambulatory Visit: Payer: Self-pay

## 2013-04-07 ENCOUNTER — Other Ambulatory Visit (INDEPENDENT_AMBULATORY_CARE_PROVIDER_SITE_OTHER): Payer: Medicare Other

## 2013-04-07 DIAGNOSIS — E785 Hyperlipidemia, unspecified: Secondary | ICD-10-CM

## 2013-04-07 DIAGNOSIS — Z0000 Encounter for general adult medical examination without abnormal findings: Secondary | ICD-10-CM

## 2013-04-07 LAB — CBC WITH DIFFERENTIAL/PLATELET
Eosinophils Relative: 3 % (ref 0.0–5.0)
HCT: 45.8 % (ref 36.0–46.0)
Hemoglobin: 15.4 g/dL — ABNORMAL HIGH (ref 12.0–15.0)
Lymphocytes Relative: 39.2 % (ref 12.0–46.0)
Lymphs Abs: 2.7 10*3/uL (ref 0.7–4.0)
Monocytes Relative: 11.2 % (ref 3.0–12.0)
Neutro Abs: 3.2 10*3/uL (ref 1.4–7.7)
Platelets: 241 10*3/uL (ref 150.0–400.0)
WBC: 7 10*3/uL (ref 4.5–10.5)

## 2013-04-07 LAB — HEPATIC FUNCTION PANEL
ALT: 13 U/L (ref 0–35)
AST: 16 U/L (ref 0–37)
Alkaline Phosphatase: 61 U/L (ref 39–117)
Total Bilirubin: 0.8 mg/dL (ref 0.3–1.2)

## 2013-04-07 LAB — BASIC METABOLIC PANEL
Calcium: 9.4 mg/dL (ref 8.4–10.5)
GFR: 98.52 mL/min (ref >60.00)
Potassium: 4.1 mEq/L (ref 3.5–5.1)
Sodium: 139 mEq/L (ref 135–145)

## 2013-04-07 LAB — LIPID PANEL
Total CHOL/HDL Ratio: 4
VLDL: 27.2 mg/dL (ref 0.0–40.0)

## 2013-04-07 LAB — TSH: TSH: 1.44 u[IU]/mL (ref 0.35–5.50)

## 2013-04-08 ENCOUNTER — Ambulatory Visit: Payer: Medicare Other

## 2013-04-08 DIAGNOSIS — R7309 Other abnormal glucose: Secondary | ICD-10-CM

## 2013-04-08 LAB — HEMOGLOBIN A1C: Hgb A1c MFr Bld: 6 % (ref 4.6–6.5)

## 2013-04-14 ENCOUNTER — Ambulatory Visit (INDEPENDENT_AMBULATORY_CARE_PROVIDER_SITE_OTHER): Payer: Medicare Other | Admitting: Internal Medicine

## 2013-04-14 ENCOUNTER — Encounter: Payer: Self-pay | Admitting: Internal Medicine

## 2013-04-14 VITALS — BP 124/74 | HR 69 | Temp 98.6°F | Ht 63.5 in | Wt 173.0 lb

## 2013-04-14 DIAGNOSIS — H9193 Unspecified hearing loss, bilateral: Secondary | ICD-10-CM

## 2013-04-14 DIAGNOSIS — Z0110 Encounter for examination of ears and hearing without abnormal findings: Secondary | ICD-10-CM

## 2013-04-14 DIAGNOSIS — R7309 Other abnormal glucose: Secondary | ICD-10-CM

## 2013-04-14 DIAGNOSIS — H9190 Unspecified hearing loss, unspecified ear: Secondary | ICD-10-CM

## 2013-04-14 DIAGNOSIS — I10 Essential (primary) hypertension: Secondary | ICD-10-CM

## 2013-04-14 DIAGNOSIS — I2510 Atherosclerotic heart disease of native coronary artery without angina pectoris: Secondary | ICD-10-CM

## 2013-04-14 DIAGNOSIS — Z23 Encounter for immunization: Secondary | ICD-10-CM

## 2013-04-14 DIAGNOSIS — E785 Hyperlipidemia, unspecified: Secondary | ICD-10-CM

## 2013-04-14 DIAGNOSIS — Z0000 Encounter for general adult medical examination without abnormal findings: Secondary | ICD-10-CM

## 2013-04-14 DIAGNOSIS — Q602 Renal agenesis, unspecified: Secondary | ICD-10-CM

## 2013-04-14 DIAGNOSIS — R7303 Prediabetes: Secondary | ICD-10-CM

## 2013-04-14 LAB — POCT URINALYSIS DIP (MANUAL ENTRY)
Bilirubin, UA: NEGATIVE
Blood, UA: NEGATIVE
Leukocytes, UA: NEGATIVE
Nitrite, UA: NEGATIVE
Protein Ur, POC: NEGATIVE
Urobilinogen, UA: 0.2
pH, UA: 5.5

## 2013-04-14 LAB — MICROALBUMIN / CREATININE URINE RATIO: Microalb Creat Ratio: 4.4 mg/g (ref 0.0–30.0)

## 2013-04-14 MED ORDER — METFORMIN HCL 500 MG PO TABS: ORAL_TABLET | ORAL | Status: DC

## 2013-04-14 MED ORDER — METOPROLOL TARTRATE 25 MG PO TABS: ORAL_TABLET | ORAL | Status: DC

## 2013-04-14 MED ORDER — SIMVASTATIN 40 MG PO TABS: ORAL_TABLET | ORAL | Status: DC

## 2013-04-14 MED ORDER — ALBUTEROL SULFATE HFA 108 (90 BASE) MCG/ACT IN AERS: 2.0000 | INHALATION_SPRAY | Freq: Four times a day (QID) | RESPIRATORY_TRACT | Status: DC | PRN

## 2013-04-14 MED ORDER — FLUTICASONE PROPIONATE 50 MCG/ACT NA SUSP: 2.0000 | Freq: Every day | NASAL | Status: DC

## 2013-04-14 NOTE — Progress Notes (Signed)
Chief Complaint  Patient presents with  . Medicare Wellness    Needs referral to a dermatologist.  Needs refills on all meds.      HPI: Patient comes in today for Preventive Medicare wellness visit . No major injuries, ed visits ,hospitalizations , new medications since last visit. Not exercising as much but ok  bp ggood Lipids no se of meds Feels like doing wellexcept for hearing   Hearing:  Horrible ;  Wants to see Dr Romeo Rabon.     Please do referral.  Vision:  No limitations at present . Last eye check UTD dr Delman Cheadle   Safety:  Has smoke detector and wears seat belts.  No firearms. No excess sun exposure. Sees dentist regularly.  Falls: no   Advance directive :  Reviewed  Has one.  Memory: Felt to be good  , no concern from her or her family.  Depression: No anhedonia unusual crying or depressive symptoms  Nutrition: Eats well balanced diet; adequate calcium and vitamin D. No swallowing chewing problems.  Injury: no major injuries in the last six months.  Other healthcare providers:  Reviewed today .  Social:  Lives alone . No pets.   Preventive parameters: up-to-date  Reviewed   ADLS:   There are no problems or need for assistance  driving, feeding, obtaining food, dressing, toileting and bathing, managing money using phone. She is independent.  EXERCISE/ HABITS   Walks aaround neighborheed Per week   No tobacco ;   etoh little .  Diet  Coke   Sweet tea ocass  Or such.    ROS:  GEN/ HEENT: No fever, significant weight changes sweats headaches vision problems  CV/ PULM; No chest pain shortness of breath cough, syncope,edema  change in exercise tolerance. GI /GU: No adominal pain, vomiting, change in bowel habits. No blood in the stool. No significant GU symptoms. SKIN/HEME: ,no acute skin rashes suspicious lesions or bleeding. No lymphadenopathy, nodules, masses.  NEURO/ PSYCH:  No neurologic signs such as weakness numbness. No depression anxiety. IMM/ Allergy: No  unusual infections.  Allergy .   inhaler nose spray works well as needed  REST of 12 system review negative except as per HPI   Past Medical History  Diagnosis Date  . Myocardial infarction     anterior stemi, hx of 2002 stent in front artery of her heart. 95% lad with intervention last stress test 2006 nl lv function hypertensive reponse subendocardial scar  . Hypertension   . Hyperlipidemia   . Gout   . Osteopenia   . Osteopenia   . Solitary kidney   . ASCUS on Pap smear     had repeat x 2 normal with [name] Plan 2011    Family History  Problem Relation Age of Onset  . Breast cancer Mother   . Lung cancer Father   . Heart attack Brother     daughter  age 15   . Colon cancer Brother   . Heart disease Daughter 75    smoker and overweight     History   Social History  . Marital Status: Married    Spouse Name: N/A    Number of Children: N/A  . Years of Education: N/A   Social History Main Topics  . Smoking status: Former Research scientist (life sciences)  . Smokeless tobacco: None     Comment: 37 years  . Alcohol Use: Yes     Comment: 1-2 per month  . Drug Use: No  . Sexual Activity:  Other Topics Concern  . 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 albuterol (PROAIR HFA) 108 (90 BASE) MCG/ACT inhaler Inhale 2 puffs into the lungs every 6 (six) hours as needed for wheezing.  [name] aspirin 325 MG tablet Take 325 mg by mouth daily.    . Ergocalciferol (VITAMIN D2) 400 UNITS TABS Take by mouth.    . fish oil-omega-3 fatty acids 1000 MG capsule Take 2 g by mouth daily.    . fluticasone (FLONASE) 50 MCG/ACT nasal spray Place 2 sprays into both nostrils daily.  . metFORMIN (GLUCOPHAGE) 500 MG tablet TAKE ONE TABLET BY MOUTH TWICE A DAY WITH MEALS  . metoprolol tartrate (LOPRESSOR) 25 MG  tablet TAKE ONE-HALF TABLET BY MOUTH TWICE DAILY  . MULTIPLE VITAMIN PO Take by mouth.    . nitroGLYCERIN (NITROSTAT) 0.4 MG SL tablet Place 0.4 mg under the tongue every 5 (five) minutes as needed.    . simvastatin (ZOCOR) 40 MG tablet Take one tablet by mouth at bedtime  . [DISCONTINUED] albuterol (PROAIR HFA) 108 (90 BASE) MCG/ACT inhaler Inhale 2 puffs into the lungs every 6 (six) hours as needed for wheezing.  . [DISCONTINUED] fluticasone (FLONASE) 50 MCG/ACT nasal spray   . [DISCONTINUED] metFORMIN (GLUCOPHAGE) 500 MG tablet TAKE ONE TABLET BY MOUTH TWICE A DAY WITH MEALS  . [DISCONTINUED] metoprolol tartrate (LOPRESSOR) 25 MG tablet TAKE ONE-HALF TABLET BY MOUTH TWICE DAILY  . [DISCONTINUED] simvastatin (ZOCOR) 40 MG tablet Take one tablet by mouth at bedtime  . [DISCONTINUED] predniSONE (DELTASONE) 10 MG tablet Take pills per day,6,6,6,4,4,4,2,2,2,1,1,1    EXAM:  BP 124/74  Pulse 69  Temp(Src) 98.6 F (37 C) (Oral)  Ht 5' 3.5" (1.613 m)  Wt 173 lb (78.472 kg)  BMI 30.16 kg/m2  SpO2 95%  Body mass index is 30.16 kg/(m^2).  Physical Exam: Vital signs reviewed RFF:MBWG is a well-developed well-nourished alert cooperative   who appears stated age in no acute distress.  HEENT: normocephalic atraumatic , Eyes: PERRL EOM's full, conjunctiva clear, Nares: paten,t no deformity discharge or tenderness., Ears: no deformity EAC's clear TMs with normal landmarks.left  Old scarring right  Mouth: clear OP, no lesions, edema.  Moist mucous membranes. Dentition in adequate repair. NECK: supple without masses, thyromegaly or bruits. CHEST/PULM:  Clear to auscultation and percussion breath sounds equal no wheeze , rales or rhonchi. No chest wall deformities or tenderness. Breast: normal by inspection . No dimpling, discharge, masses, tenderness or discharge . CV: PMI is nondisplaced, S1 S2 no gallops, murmurs, rubs. Peripheral pulses are full without delay.No JVD .  ABDOMEN: Bowel sounds normal  nontender  No guard or rebound, no hepato splenomegal no CVA tenderness.  No hernia. Extremtities:  No clubbing cyanosis or edema, no acute joint swelling or redness no focal atrophy NEURO:  Oriented x3, cranial nerves 3-12 appear to be intact, no obvious focal weakness,gait within normal limits no abnormal reflexes or asymmetrical diabetic foot exam  SKIN: No acute rashes normal turgor, color, no bruising or petechiae. PSYCH: Oriented, good eye contact, no obvious depression anxiety, cognition and judgment appear normal. LN: no cervical axillary inguinal adenopathy No noted deficits in memory, attention, and speech.   Lab Results  Component Value Date   WBC 7.0 04/07/2013   HGB 15.4* 04/07/2013   HCT 45.8 04/07/2013   PLT 241.0 04/07/2013   GLUCOSE 88 04/07/2013   CHOL 154 04/07/2013   TRIG 136.0 04/07/2013   HDL 41.50 04/07/2013   LDLCALC 85 04/07/2013   ALT 13 04/07/2013   AST 16 04/07/2013   NA 139 04/07/2013   K 4.1 04/07/2013   CL 102 04/07/2013   CREATININE 0.6 04/07/2013   BUN 16 04/07/2013   CO2 30 04/07/2013   TSH 1.44 04/07/2013   HGBA1C 6.0 04/08/2013   Wt Readings from Last 3 Encounters:  04/14/13 173 lb (78.472 kg)  08/12/12 176 lb (79.833 kg)  06/04/12 166 lb (75.297 kg)    ASSESSMENT AND PLAN:  Discussed the following assessment and plan:  Visit for preventive health examination - Plan: POCT urinalysis dipstick, Microalbumin / creatinine urine ratio  Coronary atherosclerosis of unspecified type of vessel, native or graft - Plan: Ambulatory referral to Cardiology  Unspecified essential hypertension  Pre-diabetes - Plan: Microalbumin / creatinine urine ratio  Need for prophylactic vaccination and inoculation against influenza - Plan: Flu vaccine HIGH DOSE PF (Fluzone Tri High dose)  Need for prophylactic vaccination against Streptococcus pneumoniae (pneumococcus) - Plan: Pneumococcal polysaccharide vaccine 23-valent greater than or equal to 2yo  subcutaneous/IM  Decreased hearing, bilateral - Plan: Ambulatory referral to Audiology  SOLITARY KIDNEY  Other and unspecified hyperlipidemia  Patient Care Team: Burnis Medin, MD as PCP - General Logan Bores, MD as Attending Physician (Obstetrics and Gynecology) Irene Shipper, MD as Attending Physician (Gastroenterology) Fabio Pierce, MD as Consulting Physician (Ophthalmology)  Patient Instructions  Will do a referral to   Dr  Romeo Rabon.   and cardiology dr  Continue lifestyle intervention healthy eating and exercise .        Standley Brooking. Olla Delancey M.D. Health Maintenance  Topic Date Due  . Influenza Vaccine  12/25/2013  . Mammogram  09/18/2014  . Colonoscopy  12/24/2016  . Tetanus/tdap  09/13/2017  . Pneumococcal Polysaccharide Vaccine Age 70 And Over  Completed  . Zostavax  Completed   Health Maintenance Review  Flu vaccine today.   Pre visit review using our clinic review tool, if applicable. No additional management support is needed unless otherwise documented below in the visit note.

## 2013-04-14 NOTE — Patient Instructions (Signed)
Will do a referral to   Dr  Romeo Rabon.   and cardiology dr  Continue lifestyle intervention healthy eating and exercise .

## 2013-04-16 ENCOUNTER — Encounter: Payer: Self-pay | Admitting: Family Medicine

## 2013-04-20 ENCOUNTER — Encounter: Payer: Self-pay | Admitting: Cardiology

## 2013-04-20 ENCOUNTER — Ambulatory Visit (INDEPENDENT_AMBULATORY_CARE_PROVIDER_SITE_OTHER): Payer: Medicare Other | Admitting: Cardiology

## 2013-04-20 VITALS — BP 114/70 | HR 76 | Ht 64.0 in | Wt 173.6 lb

## 2013-04-20 DIAGNOSIS — I2510 Atherosclerotic heart disease of native coronary artery without angina pectoris: Secondary | ICD-10-CM

## 2013-04-20 NOTE — Patient Instructions (Signed)
The current medical regimen is effective;  continue present plan and medications.  Your physician has requested that you have an exercise tolerance test. For further information please visit [email or web address]. Please also follow instruction sheet, as given.  Follow up in 1 year with Dr Percival Spanish.  You will receive a letter in the mail 2 months before you are due.  Please call us when you receive this letter to schedule your follow up appointment.

## 2013-04-20 NOTE — Progress Notes (Signed)
HPI The patient presents for evaluation of CAD. She has a history of an anterior MI in 2002.   She had DES to the LAD.  Her cardiologist moved out of town and she has not been seen in several years.  She apparently had no active ischemia on stress testing in 2006.  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 However, she does not exercise routinely. With her usual activities she denies any cardiovascular symptoms. The patient denies any new symptoms such as chest discomfort, neck or arm discomfort. There has been no new shortness of breath, PND or orthopnea. There have been no reported palpitations, presyncope or syncope.  She is back for routine followup.  Allergies  Allergen Reactions  . Atorvastatin     REACTION: myalgias  and liver    Current Outpatient Prescriptions  Medication Sig Dispense Refill  . aspirin 325 MG tablet Take 325 mg by mouth daily.        . Ergocalciferol (VITAMIN D2) 400 UNITS TABS Take by mouth.        . fish oil-omega-3 fatty acids 1000 MG capsule Take 2 g by mouth daily.        . metFORMIN (GLUCOPHAGE) 500 MG tablet TAKE ONE TABLET BY MOUTH TWICE A DAY WITH MEALS  180 tablet  3  . metoprolol tartrate (LOPRESSOR) 25 MG tablet TAKE ONE-HALF TABLET BY MOUTH TWICE DAILY  90 tablet  3  . MULTIPLE VITAMIN PO Take 1 tablet by mouth daily.       . nitroGLYCERIN (NITROSTAT) 0.4 MG SL tablet Place 0.4 mg under the tongue every 5 (five) minutes as needed.        . simvastatin (ZOCOR) 40 MG tablet Take one tablet by mouth at bedtime  90 tablet  3   No current facility-administered medications for this visit.    Past Medical History  Diagnosis Date  . Myocardial infarction     anterior stemi, hx of 2002 stent in front artery of her heart. 95% lad with intervention last stress test 2006 nl lv function hypertensive reponse subendocardial scar  . Hypertension   . Hyperlipidemia   . Gout   . Osteopenia   . Solitary kidney   . ASCUS on Pap smear     had repeat x 2 normal with  [name] Plan 2011    Past Surgical History  Procedure Laterality Date  . Right oophorectomy with salpingectomy      benign growth Dr. Joneen Caraway  . Lad stent angioplasty  2002  . Hemorrhoid surgery  2011    Family History  Problem Relation Age of Onset  . Breast cancer Mother 37  . Lung cancer Father   . Heart attack Brother     Died age 59 MI  . Colon cancer Brother   . Heart disease Daughter 1    smoker and overweight     History   Social History  . Marital Status: Married    Spouse Name: N/A    Number of Children: 2  . Years of Education: N/A   Occupational History  .     Social History Main Topics  . Smoking status: Former Smoker    Types: Cigarettes  . Smokeless tobacco: Not on file     Comment: 37 years  . Alcohol Use: Yes     Comment: 1-2 per month  . Drug Use: No  . Sexual Activity: Not on file   Other Topics Concern  . 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  Otherwise as stated in the HPI and negative for all other systems.  PHYSICAL EXAM BP 114/70  Pulse 76  Ht 5' 4" (1.626 m)  Wt 173 lb 9.6 oz (78.744 kg)  BMI 29.78 kg/m2 GENERAL:  Well appearing HEENT:  Pupils equal round and reactive, fundi not visualized, oral mucosa unremarkable NECK:  No jugular venous distention, waveform within normal limits, carotid upstroke brisk and symmetric, no bruits, no thyromegaly LYMPHATICS:  No cervical, inguinal adenopathy LUNGS:  Clear to auscultation bilaterally BACK:  No CVA tenderness CHEST:  Unremarkable HEART:  PMI not displaced or sustained,S1 and S2 within normal limits, no S3, no S4, no clicks, no rubs, no murmurs ABD:  Flat, positive bowel sounds normal in frequency in pitch, no bruits, no rebound, no guarding, no midline pulsatile mass, no  hepatomegaly, no splenomegaly EXT:  2 plus pulses throughout, no edema, no cyanosis no clubbing SKIN:  No rashes no nodules NEURO:  Cranial nerves II through XII grossly intact, motor grossly intact throughout PSYCH:  Cognitively intact, oriented to person place and time   EKG:  Sinus rhythm, rate 77, axis within normal limits, intervals within normal limits, poor anterior R wave progression consistent with old anteroseptal infarct. No acute ST-T wave changes. 04/20/2013  ASSESSMENT AND PLAN  CAD:  The patient has no active symptoms. However, given her previous coronary disease she needs to be screened.  I will bring the patient back for a POET (Plain Old Exercise Test). This will allow me to screen for obstructive coronary disease, risk stratify and very importantly provide a prescription for exercise.  DYSLIPIDEMIA:  I reviewed her last lipids and her LDL was 85. I think she should remain on the current regimen and look at her medications and increase exercise. She did have her medicines adjusted if she is not less than 70 at future readings. At that point I would switch to Lipitor 40 mg.  HTN:  The blood pressure is at target. No change in medications is indicated. We will continue with therapeutic lifestyle changes (TLC).  DIABETES:  Her hemoglobin A1c was 6. She will continue with management per Dr. Lottie Dawson, MD

## 2013-05-12 ENCOUNTER — Telehealth (HOSPITAL_COMMUNITY): Payer: Self-pay | Admitting: *Deleted

## 2013-05-18 ENCOUNTER — Encounter: Payer: Medicare Other | Admitting: Internal Medicine

## 2013-05-18 ENCOUNTER — Encounter: Payer: Self-pay | Admitting: Internal Medicine

## 2013-05-18 NOTE — Progress Notes (Signed)
Document opened and reviewed for OV but appt  canceled same day .

## 2013-06-08 ENCOUNTER — Telehealth (HOSPITAL_COMMUNITY): Payer: Self-pay | Admitting: *Deleted

## 2013-07-16 ENCOUNTER — Telehealth (HOSPITAL_COMMUNITY): Payer: Self-pay | Admitting: *Deleted

## 2013-10-05 ENCOUNTER — Other Ambulatory Visit (INDEPENDENT_AMBULATORY_CARE_PROVIDER_SITE_OTHER): Payer: Medicare Other

## 2013-10-05 DIAGNOSIS — R7309 Other abnormal glucose: Secondary | ICD-10-CM

## 2013-10-05 DIAGNOSIS — R7303 Prediabetes: Secondary | ICD-10-CM

## 2013-10-05 LAB — HEMOGLOBIN A1C: HEMOGLOBIN A1C: 6.2 % (ref 4.6–6.5)

## 2013-10-12 ENCOUNTER — Encounter: Payer: Self-pay | Admitting: Internal Medicine

## 2013-10-12 ENCOUNTER — Ambulatory Visit (INDEPENDENT_AMBULATORY_CARE_PROVIDER_SITE_OTHER): Payer: Medicare Other | Admitting: Internal Medicine

## 2013-10-12 VITALS — BP 130/80 | Temp 99.0°F | Ht 63.5 in | Wt 179.0 lb

## 2013-10-12 DIAGNOSIS — R7303 Prediabetes: Secondary | ICD-10-CM

## 2013-10-12 DIAGNOSIS — R7309 Other abnormal glucose: Secondary | ICD-10-CM

## 2013-10-12 DIAGNOSIS — I2510 Atherosclerotic heart disease of native coronary artery without angina pectoris: Secondary | ICD-10-CM

## 2013-10-12 DIAGNOSIS — I10 Essential (primary) hypertension: Secondary | ICD-10-CM

## 2013-10-12 DIAGNOSIS — H9190 Unspecified hearing loss, unspecified ear: Secondary | ICD-10-CM

## 2013-10-12 NOTE — Assessment & Plan Note (Signed)
Got hearing aids and doing great.

## 2013-10-12 NOTE — Patient Instructions (Signed)
Continue lifestyle intervention healthy eating and exercise . Wellness visit in 6 months .  With labs  And hg a1c.   Wt Readings from Last 3 Encounters:  10/12/13 179 lb (81.194 kg)  04/20/13 173 lb 9.6 oz (78.744 kg)  04/14/13 173 lb (78.472 kg)

## 2013-10-12 NOTE — Progress Notes (Signed)
Chief Complaint  Patient presents with  . Follow-up    dm pre diabetes ht lipids     HPI: Followup of medical problems. Since her last visit she has seen cardiology was to get a plain old exercise test but uncertain if insurance will pay no cardiovascular symptoms. Was able to get hearing aids through Dr. Constance Holster and is doing quite well with this. Continuing lifestyle changes although she is gone off her healthier diet recently because of her birthday is gained a little bit of weight and plans to reinstitute efforts at weight loss. Blood pressure is been stable. Generally been well. ROS: See pertinent positives and negatives per HPI. No new neurologic signs or vision changes.  Past Medical History  Diagnosis Date  . Myocardial infarction     2002 95% proximal LAD stenosis with thrombus followed by 90% stenosis, the first diagonal 80% stenosis, circumflex 30% stenosis, circumflex obtuse marginal subbranch 70% stenosis, PDA 40% stenosis. She had a drug-eluting stent placement with a Pixel stent  . Hypertension   . Hyperlipidemia   . Gout   . Osteopenia   . Solitary kidney   . ASCUS on Pap smear     had repeat x 2 normal with [name] Plan 2011    Family History  Problem Relation Age of Onset  . Breast cancer Mother 5  . Lung cancer Father   . Heart attack Brother     Died age 6 MI  . Colon cancer Brother   . Heart disease Daughter 47    smoker and overweight     History   Social History  . Marital Status: Married    Spouse Name: N/A    Number of Children: 2  . Years of Education: N/A   Occupational History  .     Social History Main Topics  . Smoking status: Former Smoker    Types: Cigarettes  . Smokeless tobacco: None     Comment: 38 years  . Alcohol Use: Yes     Comment: 1-2 per month  . Drug Use: No  . Sexual Activity: None   Other Topics Concern  . 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 aspirin 81 MG tablet Take 81 mg by mouth daily.  . Ergocalciferol (VITAMIN D2) 400 UNITS TABS Take by mouth.    . fish oil-omega-3 fatty acids 1000 MG capsule Take 2 g by mouth daily.    . metFORMIN (GLUCOPHAGE) 500 MG tablet TAKE ONE TABLET BY MOUTH TWICE A DAY WITH MEALS  . metoprolol tartrate (LOPRESSOR) 25 MG tablet TAKE ONE-HALF TABLET BY MOUTH TWICE DAILY  . MULTIPLE VITAMIN PO Take 1 tablet by mouth daily.   . nitroGLYCERIN (NITROSTAT) 0.4 MG SL tablet Place 0.4 mg under the tongue every 5 (five) minutes as needed.    . simvastatin (ZOCOR) 40 MG tablet Take one tablet by mouth at bedtime  . [DISCONTINUED] aspirin 325 MG tablet Take 325 mg by mouth daily.      EXAM:  BP 130/80  Temp(Src) 99 F (37.2 C) (Oral)  Ht 5' 3.5" (1.613 m)  Wt 179 lb (81.194 kg)  BMI 31.21 kg/m2  Body mass index is 31.21 kg/(m^2).  GENERAL: vitals reviewed and listed above, alert, oriented, appears well hydrated and in no acute distress HEENT: atraumatic, conjunctiva  clear, no obvious abnormalities on inspection of external nose and ears NECK: no obvious masses on inspection palpation  LUNGS: clear to auscultation bilaterally, no wheezes, rales or rhonchi, good air movement CV: HRRR, no clubbing cyanosis or  peripheral edema nl cap refill  MS: moves all extremities without noticeable focal  abnormality PSYCH: pleasant and cooperative, no obvious depression or anxiety Lab Results  Component Value Date   WBC 7.0 04/07/2013   HGB 15.4* 04/07/2013   HCT 45.8 04/07/2013   PLT 241.0 04/07/2013   GLUCOSE 88 04/07/2013   CHOL 154 04/07/2013   TRIG 136.0 04/07/2013   HDL 41.50 04/07/2013   LDLCALC 85 04/07/2013   ALT 13 04/07/2013   AST 16 04/07/2013   NA 139 04/07/2013   K 4.1 04/07/2013   CL 102 04/07/2013   CREATININE 0.6 04/07/2013   BUN 16  04/07/2013   CO2 30 04/07/2013   TSH 1.44 04/07/2013   HGBA1C 6.2 10/05/2013   MICROALBUR 3.8* 04/14/2013   Weight: 179 lb (81.194 kg)  Wt Readings from Last 3 Encounters:  10/12/13 179 lb (81.194 kg)  04/20/13 173 lb 9.6 oz (78.744 kg)  04/14/13 173 lb (78.472 kg)    ASSESSMENT AND PLAN:  Discussed the following assessment and plan:  Pre-diabetes - stable   Unspecified essential hypertension - controlled   Coronary atherosclerosis of unspecified type of vessel, native or graft  Decreased hearing Doing well    Avoid further weight gain  Hearing aids have been helpful  Had mammogram utd on hcm  -Patient advised to return or notify health care team  if symptoms worsen ,persist or new concerns arise.  Patient Instructions   Continue lifestyle intervention healthy eating and exercise . Wellness visit in 6 months .  With labs  And hg a1c.   Wt Readings from Last 3 Encounters:  10/12/13 179 lb (81.194 kg)  04/20/13 173 lb 9.6 oz (78.744 kg)  04/14/13 173 lb (78.472 kg)      Mariann Laster K. Leelynn Whetsel M.D.  Pre visit review using our clinic review tool, if applicable. No additional management support is needed unless otherwise documented below in the visit note.

## 2013-10-13 ENCOUNTER — Telehealth: Payer: Self-pay | Admitting: Internal Medicine

## 2013-10-13 NOTE — Telephone Encounter (Signed)
Relevant patient education mailed to patient.

## 2013-10-14 ENCOUNTER — Encounter: Payer: Self-pay | Admitting: Internal Medicine

## 2013-11-02 ENCOUNTER — Encounter: Payer: Self-pay | Admitting: Internal Medicine

## 2013-11-10 ENCOUNTER — Encounter: Payer: Self-pay | Admitting: Internal Medicine

## 2014-03-08 LAB — HM DIABETES EYE EXAM

## 2014-03-18 ENCOUNTER — Encounter: Payer: Self-pay | Admitting: Internal Medicine

## 2014-04-08 ENCOUNTER — Other Ambulatory Visit (INDEPENDENT_AMBULATORY_CARE_PROVIDER_SITE_OTHER): Payer: Medicare Other

## 2014-04-08 ENCOUNTER — Telehealth: Payer: Self-pay | Admitting: Internal Medicine

## 2014-04-08 DIAGNOSIS — Z0000 Encounter for general adult medical examination without abnormal findings: Secondary | ICD-10-CM

## 2014-04-08 DIAGNOSIS — R7309 Other abnormal glucose: Secondary | ICD-10-CM

## 2014-04-08 DIAGNOSIS — I10 Essential (primary) hypertension: Secondary | ICD-10-CM

## 2014-04-08 DIAGNOSIS — R7303 Prediabetes: Secondary | ICD-10-CM

## 2014-04-08 LAB — LIPID PANEL
Cholesterol: 155 mg/dL (ref 0–200)
HDL: 36.8 mg/dL — AB (ref >39.00)
LDL Cholesterol: 93 mg/dL (ref 0–99)
NONHDL: 118.2
Total CHOL/HDL Ratio: 4
Triglycerides: 128 mg/dL (ref 0.0–149.0)
VLDL: 25.6 mg/dL (ref 0.0–40.0)

## 2014-04-08 LAB — CBC WITH DIFFERENTIAL/PLATELET
BASOS ABS: 0 10*3/uL (ref 0.0–0.1)
BASOS PCT: 0.7 % (ref 0.0–3.0)
Eosinophils Absolute: 0.3 10*3/uL (ref 0.0–0.7)
Eosinophils Relative: 3.7 % (ref 0.0–5.0)
HCT: 47.5 % — ABNORMAL HIGH (ref 36.0–46.0)
HEMOGLOBIN: 15.5 g/dL — AB (ref 12.0–15.0)
LYMPHS PCT: 38.5 % (ref 12.0–46.0)
Lymphs Abs: 2.9 10*3/uL (ref 0.7–4.0)
MCHC: 32.6 g/dL (ref 30.0–36.0)
MCV: 89.3 fl (ref 78.0–100.0)
MONOS PCT: 9.2 % (ref 3.0–12.0)
Monocytes Absolute: 0.7 10*3/uL (ref 0.1–1.0)
NEUTROS ABS: 3.6 10*3/uL (ref 1.4–7.7)
Neutrophils Relative %: 47.9 % (ref 43.0–77.0)
Platelets: 256 10*3/uL (ref 150.0–400.0)
RBC: 5.32 Mil/uL — AB (ref 3.87–5.11)
RDW: 13.2 % (ref 11.5–15.5)
WBC: 7.6 10*3/uL (ref 4.0–10.5)

## 2014-04-08 LAB — BASIC METABOLIC PANEL
BUN: 16 mg/dL (ref 6–23)
CALCIUM: 9.2 mg/dL (ref 8.4–10.5)
CO2: 22 mEq/L (ref 19–32)
Chloride: 103 mEq/L (ref 96–112)
Creatinine, Ser: 0.8 mg/dL (ref 0.4–1.2)
GFR: 75.93 mL/min (ref >60.00)
Glucose, Bld: 93 mg/dL (ref 70–99)
Potassium: 3.8 mEq/L (ref 3.5–5.1)
SODIUM: 142 meq/L (ref 135–145)

## 2014-04-08 LAB — HEPATIC FUNCTION PANEL
ALBUMIN: 3.5 g/dL (ref 3.5–5.2)
ALK PHOS: 63 U/L (ref 39–117)
ALT: 12 U/L (ref 0–35)
AST: 15 U/L (ref 0–37)
Bilirubin, Direct: 0 mg/dL (ref 0.0–0.3)
TOTAL PROTEIN: 7.1 g/dL (ref 6.0–8.3)
Total Bilirubin: 0.6 mg/dL (ref 0.2–1.2)

## 2014-04-08 LAB — TSH: TSH: 2.93 u[IU]/mL (ref 0.35–4.50)

## 2014-04-08 LAB — HEMOGLOBIN A1C: Hgb A1c MFr Bld: 6 % (ref 4.6–6.5)

## 2014-04-08 MED ORDER — METFORMIN HCL 500 MG PO TABS: ORAL_TABLET | ORAL | Status: DC

## 2014-04-08 MED ORDER — SIMVASTATIN 40 MG PO TABS: ORAL_TABLET | ORAL | Status: DC

## 2014-04-08 NOTE — Telephone Encounter (Signed)
Sent to the pharmacy by e-scribe for 90 days.  Pt has cpx scheduled for 04/18/14

## 2014-04-08 NOTE — Telephone Encounter (Signed)
[facility] is requesting re-fills on metFORMIN (GLUCOPHAGE) 500 MG tablet, metoprolol tartrate (LOPRESSOR) 25 MG tablet, simvastatin (ZOCOR) 40 MG tablet

## 2014-04-13 MED ORDER — METOPROLOL TARTRATE 25 MG PO TABS: ORAL_TABLET | ORAL | Status: DC

## 2014-04-13 NOTE — Telephone Encounter (Signed)
Sent to the pharmacy by e-scribe. 

## 2014-04-13 NOTE — Addendum Note (Signed)
Addended by: Miles Costain T on: 04/13/2014 08:25 AM   Modules accepted: Orders

## 2014-04-13 NOTE — Telephone Encounter (Signed)
[facility] is requesting re-fill on metoprolol tartrate (LOPRESSOR) 25 MG tablet

## 2014-04-18 ENCOUNTER — Encounter: Payer: Self-pay | Admitting: Internal Medicine

## 2014-04-18 ENCOUNTER — Ambulatory Visit (INDEPENDENT_AMBULATORY_CARE_PROVIDER_SITE_OTHER): Payer: Medicare Other | Admitting: Internal Medicine

## 2014-04-18 VITALS — BP 112/66 | Temp 98.1°F | Ht 63.5 in | Wt 181.0 lb

## 2014-04-18 DIAGNOSIS — H9193 Unspecified hearing loss, bilateral: Secondary | ICD-10-CM

## 2014-04-18 DIAGNOSIS — R7309 Other abnormal glucose: Secondary | ICD-10-CM

## 2014-04-18 DIAGNOSIS — Z0000 Encounter for general adult medical examination without abnormal findings: Secondary | ICD-10-CM

## 2014-04-18 DIAGNOSIS — I10 Essential (primary) hypertension: Secondary | ICD-10-CM

## 2014-04-18 DIAGNOSIS — Z23 Encounter for immunization: Secondary | ICD-10-CM

## 2014-04-18 DIAGNOSIS — D582 Other hemoglobinopathies: Secondary | ICD-10-CM

## 2014-04-18 DIAGNOSIS — R7303 Prediabetes: Secondary | ICD-10-CM

## 2014-04-18 DIAGNOSIS — E785 Hyperlipidemia, unspecified: Secondary | ICD-10-CM

## 2014-04-18 MED ORDER — METOPROLOL TARTRATE 25 MG PO TABS: ORAL_TABLET | ORAL | Status: DC

## 2014-04-18 MED ORDER — NITROGLYCERIN 0.4 MG SL SUBL: 0.4000 mg | SUBLINGUAL_TABLET | SUBLINGUAL | Status: DC | PRN

## 2014-04-18 MED ORDER — METFORMIN HCL 500 MG PO TABS: ORAL_TABLET | ORAL | Status: DC

## 2014-04-18 MED ORDER — SIMVASTATIN 40 MG PO TABS: ORAL_TABLET | ORAL | Status: DC

## 2014-04-18 NOTE — Progress Notes (Signed)
Pre visit review using our clinic review tool, if applicable. No additional management support is needed unless otherwise documented below in the visit note.  Chief Complaint  Patient presents with  . Medicare Wellness    medications and labs     HPI: Patient comes in today for Preventive Medicare wellness visit .and Chronic disease management  Now had hearing aids    Adjustable  Better  3 months   Adjustments  No major injuries, ed visits ,hospitalizations , new medications since last visit. Reg check with cards no sx   But needs refill ntg as is expired  Still attending to pre diabetes diabetes control .   More exercise planned  No eye sx numbness weakness  DM sx .  Still taking metformin LIPIDS  No se of med noted  BP controlled   LIFESTYLE:  Exercise:     Not a lot  Silver sneakers to do  Janunauary  Tobacco/ETS: no Alcohol: little Sugar beverages: no Sleep: hours about 7  Drug use: no Bone density:  2014 - 8  Colonoscopy:  utd  Mammo utd.    Health Maintenance  Topic Date Due  . INFLUENZA VACCINE  12/26/2014  . MAMMOGRAM  09/24/2015  . COLONOSCOPY  12/24/2016  . TETANUS/TDAP  09/13/2017  . PNEUMOCOCCAL POLYSACCHARIDE VACCINE AGE 67 AND OVER  Completed  . ZOSTAVAX  Completed   Health Maintenance Review    Hearing:    Now with aids adjustable .   Vision:  No limitations at present . Last eye check UTD  Safety:  Has smoke detector and wears seat belts.  No firearms. No excess sun exposure. Sees dentist regularly.  Falls:  no  Advance directive :  Reviewed  Has one.  Memory: Felt to be good  , no concern from her or her family.  Depression: No anhedonia unusual crying or depressive symptoms  Nutrition: Eats well balanced diet; adequate calcium and vitamin D. No swallowing chewing problems.  Injury: no major injuries in the last six months.  Other healthcare providers:  Reviewed today .  Social:  Lives with spouse married. No pets.   Preventive  parameters: up-to-date  Reviewed   ADLS:   There are no problems or need for assistance  driving, feeding, obtaining food, dressing, toileting and bathing, managing money using phone. She is independent.  ROS:  GEN/ HEENT: No fever, significant weight changes sweats headaches vision problems hearing changes, CV/ PULM; No chest pain shortness of breath cough, syncope,edema  change in exercise tolerance. GI /GU: No adominal pain, vomiting, change in bowel habits. No blood in the stool. No significant GU symptoms. SKIN/HEME: ,no acute skin rashes suspicious lesions or bleeding. No lymphadenopathy, nodules, masses.  NEURO/ PSYCH:  No neurologic signs such as weakness numbness. No depression anxiety. IMM/ Allergy: No unusual infections.  Allergy .   REST of 12 system review negative except as per HPI   Past Medical History  Diagnosis Date  . Myocardial infarction     2002 95% proximal LAD stenosis with thrombus followed by 90% stenosis, the first diagonal 80% stenosis, circumflex 30% stenosis, circumflex obtuse marginal subbranch 70% stenosis, PDA 40% stenosis. She had a drug-eluting stent placement with a Pixel stent  . Hypertension   . Hyperlipidemia   . Gout   . Osteopenia   . Solitary kidney   . ASCUS on Pap smear     had repeat x 2 normal with [name] Plan 2011    Family History  Problem  Relation Age of Onset  . Breast cancer Mother 76  . Lung cancer Father   . Heart attack Brother     Died age 31 MI  . Colon cancer Brother   . Heart disease Daughter 44    smoker and overweight     History   Social History  . Marital Status: Married    Spouse Name: N/A    Number of Children: 2  . Years of Education: N/A   Occupational History  .     Social History Main Topics  . Smoking status: Former Smoker    Types: Cigarettes  . Smokeless tobacco: None     Comment: 38 years  . Alcohol Use: Yes     Comment: 1-2 per month  . Drug Use: No  . Sexual Activity: None   Other  Topics Concern  . 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 aspirin 81 MG tablet Take 81 mg by mouth daily.  . Ergocalciferol (VITAMIN D2) 400 UNITS TABS Take by mouth.    . fish oil-omega-3 fatty acids 1000 MG capsule Take 2 g by mouth daily.    . metFORMIN (GLUCOPHAGE) 500 MG tablet TAKE ONE TABLET BY MOUTH TWICE A DAY WITH MEALS  . metoprolol tartrate (LOPRESSOR) 25 MG tablet TAKE ONE-HALF TABLET BY MOUTH TWICE DAILY  . MULTIPLE VITAMIN PO Take 1 tablet by mouth daily.   . nitroGLYCERIN (NITROSTAT) 0.4 MG SL tablet Place 1 tablet (0.4 mg total) under the tongue every 5 (five) minutes as needed.  . simvastatin (ZOCOR) 40 MG tablet Take one tablet by mouth at bedtime  . [DISCONTINUED] metFORMIN (GLUCOPHAGE) 500 MG tablet TAKE ONE TABLET BY MOUTH TWICE A DAY WITH MEALS  . [DISCONTINUED] metoprolol tartrate (LOPRESSOR) 25 MG tablet TAKE ONE-HALF TABLET BY MOUTH TWICE DAILY  . [DISCONTINUED] nitroGLYCERIN (NITROSTAT) 0.4 MG SL tablet Place 0.4 mg under the tongue every 5 (five) minutes as needed.    . [DISCONTINUED] simvastatin (ZOCOR) 40 MG tablet Take one tablet by mouth at bedtime    EXAM:  BP 112/66 mmHg  Temp(Src) 98.1 F (36.7 C) (Oral)  Ht 5' 3.5" (1.613 m)  Wt 181 lb (82.101 kg)  BMI 31.56 kg/m2  Body mass index is 31.56 kg/(m^2).  Physical Exam: Vital signs reviewed HAL:PFXT is a well-developed well-nourished alert cooperative   who appears stated age in no acute distress.  HEENT: normocephalic atraumatic , Eyes: PERRL EOM's full, conjunctiva clear, Nares: paten,t no deformity discharge or tenderness., Ears: no deformity EAC's clear TMs with normal landmarks. Mouth: clear OP, no lesions, edema.  Moist mucous membranes. Dentition in adequate repair. NECK:  supple without masses, thyromegaly or bruits. CHEST/PULM:  Clear to auscultation and percussion breath sounds equal no wheeze , rales or rhonchi. No chest wall deformities or tenderness. CV: PMI is nondisplaced, S1 S2 no gallops, murmurs, rubs. Peripheral pulses are full without delay.No JVD .  ABDOMEN: Bowel sounds normal nontender  No guard or rebound, no hepato splenomegal no CVA tenderness.  No hernia. Extremtities:  No clubbing cyanosis or edema, no acute joint swelling or redness no focal atrophy NEURO:  Oriented x3, cranial nerves 3-12 appear to be intact,  no obvious focal weakness,gait within normal limits no abnormal reflexes or asymmetrical SKIN: No acute rashes normal turgor, color, no bruising or petechiae. PSYCH: Oriented, good eye contact, no obvious depression anxiety, cognition and judgment appear normal. LN: no cervical axillary inguinal adenopathy No noted deficits in memory, attention, and speech.  Lab Results  Component Value Date   WBC 7.6 04/08/2014   HGB 15.5* 04/08/2014   HCT 47.5* 04/08/2014   PLT 256.0 04/08/2014   GLUCOSE 93 04/08/2014   CHOL 155 04/08/2014   TRIG 128.0 04/08/2014   HDL 36.80* 04/08/2014   LDLCALC 93 04/08/2014   ALT 12 04/08/2014   AST 15 04/08/2014   NA 142 04/08/2014   K 3.8 04/08/2014   CL 103 04/08/2014   CREATININE 0.8 04/08/2014   BUN 16 04/08/2014   CO2 22 04/08/2014   TSH 2.93 04/08/2014   HGBA1C 6.0 04/08/2014   MICROALBUR 3.8* 04/14/2013    ASSESSMENT AND PLAN:  Discussed the following assessment and plan:  Visit for preventive health examination  Pre-diabetes - controlled still no obv end organ disease but has preexisting stable CAD  Elevated hemoglobin - 15 range no osa sx  tobacco other obv causes  optinos see heme  or follow   Decreased hearing, bilateral  Essential hypertension  Hyperlipidemia  Need for prophylactic vaccination and inoculation against influenza - Plan: Flu Vaccine QUAD 36+ mos PF IM  (Fluarix Quad PF) Plan u pre diabetes dm and  Elevated hemogobin  Patient Care Team: Burnis Medin, MD as PCP - General Logan Bores, MD as Attending Physician (Obstetrics and Gynecology) Irene Shipper, MD as Attending Physician (Gastroenterology) Fabio Pierce, MD as Consulting Physician (Ophthalmology) Minus Breeding, MD as Consulting Physician (Cardiology)  Patient Instructions  Yearly flu vaccine.   bone density in 3-4 years.  Cbc diff and hga1c pre visit in 6 months . saline nose spray  and nasal cortisone  also may help.  Sinuses   Healthy lifestyle includes : At least 150 minutes of exercise weeks  , weight at healthy levels, which is usually   BMI 19-25. Avoid trans fats and processed foods;  Increase fresh fruits and veges to 5 servings per day. And avoid sweet beverages including tea and juice. Mediterranean diet with olive oil and nuts have been noted to be heart and brain healthy . Avoid tobacco products . Limit  alcohol to  7 per week for women and 14 servings for men.  Get adequate sleep . Wear seat belts . Don't text and drive .       Standley Brooking. Panosh M.D.

## 2014-04-18 NOTE — Patient Instructions (Addendum)
Yearly flu vaccine.   bone density in 3-4 years.  Cbc diff and hga1c pre visit in 6 months . saline nose spray  and nasal cortisone  also may help.  Sinuses   Healthy lifestyle includes : At least 150 minutes of exercise weeks  , weight at healthy levels, which is usually   BMI 19-25. Avoid trans fats and processed foods;  Increase fresh fruits and veges to 5 servings per day. And avoid sweet beverages including tea and juice. Mediterranean diet with olive oil and nuts have been noted to be heart and brain healthy . Avoid tobacco products . Limit  alcohol to  7 per week for women and 14 servings for men.  Get adequate sleep . Wear seat belts . Don't text and drive .

## 2014-04-24 ENCOUNTER — Encounter: Payer: Self-pay | Admitting: Internal Medicine

## 2014-04-24 DIAGNOSIS — Z0000 Encounter for general adult medical examination without abnormal findings: Secondary | ICD-10-CM | POA: Insufficient documentation

## 2014-04-24 DIAGNOSIS — D582 Other hemoglobinopathies: Secondary | ICD-10-CM | POA: Insufficient documentation

## 2014-04-27 ENCOUNTER — Encounter: Payer: Self-pay | Admitting: Family Medicine

## 2014-05-03 ENCOUNTER — Encounter: Payer: Self-pay | Admitting: Internal Medicine

## 2014-05-30 ENCOUNTER — Encounter: Payer: Self-pay | Admitting: Internal Medicine

## 2014-09-13 DIAGNOSIS — H903 Sensorineural hearing loss, bilateral: Secondary | ICD-10-CM | POA: Diagnosis not present

## 2014-10-11 ENCOUNTER — Other Ambulatory Visit: Payer: Medicare Other

## 2014-10-18 ENCOUNTER — Ambulatory Visit: Payer: Medicare Other | Admitting: Internal Medicine

## 2014-10-20 DIAGNOSIS — S00412A Abrasion of left ear, initial encounter: Secondary | ICD-10-CM | POA: Diagnosis not present

## 2014-10-20 DIAGNOSIS — Z803 Family history of malignant neoplasm of breast: Secondary | ICD-10-CM | POA: Diagnosis not present

## 2014-10-20 DIAGNOSIS — H903 Sensorineural hearing loss, bilateral: Secondary | ICD-10-CM | POA: Diagnosis not present

## 2014-10-20 DIAGNOSIS — Z1231 Encounter for screening mammogram for malignant neoplasm of breast: Secondary | ICD-10-CM | POA: Diagnosis not present

## 2014-10-20 LAB — HM MAMMOGRAPHY

## 2014-10-21 ENCOUNTER — Encounter: Payer: Self-pay | Admitting: Family Medicine

## 2014-10-27 ENCOUNTER — Ambulatory Visit: Payer: Self-pay | Admitting: Internal Medicine

## 2015-04-11 DIAGNOSIS — E119 Type 2 diabetes mellitus without complications: Secondary | ICD-10-CM | POA: Diagnosis not present

## 2015-04-11 LAB — HM DIABETES EYE EXAM

## 2015-04-14 ENCOUNTER — Other Ambulatory Visit: Payer: Self-pay

## 2015-04-21 ENCOUNTER — Encounter: Payer: Self-pay | Admitting: Internal Medicine

## 2015-04-25 ENCOUNTER — Other Ambulatory Visit (INDEPENDENT_AMBULATORY_CARE_PROVIDER_SITE_OTHER): Payer: Medicare Other

## 2015-04-25 DIAGNOSIS — Z0000 Encounter for general adult medical examination without abnormal findings: Secondary | ICD-10-CM

## 2015-04-25 DIAGNOSIS — E785 Hyperlipidemia, unspecified: Secondary | ICD-10-CM | POA: Diagnosis not present

## 2015-04-25 LAB — CBC WITH DIFFERENTIAL/PLATELET
BASOS ABS: 0.1 10*3/uL (ref 0.0–0.1)
Basophils Relative: 0.8 % (ref 0.0–3.0)
EOS PCT: 2.6 % (ref 0.0–5.0)
Eosinophils Absolute: 0.2 10*3/uL (ref 0.0–0.7)
HCT: 48 % — ABNORMAL HIGH (ref 36.0–46.0)
HEMOGLOBIN: 16 g/dL — AB (ref 12.0–15.0)
Lymphocytes Relative: 40.8 % (ref 12.0–46.0)
Lymphs Abs: 3.1 10*3/uL (ref 0.7–4.0)
MCHC: 33.3 g/dL (ref 30.0–36.0)
MCV: 89.1 fl (ref 78.0–100.0)
Monocytes Absolute: 0.8 10*3/uL (ref 0.1–1.0)
Monocytes Relative: 10.8 % (ref 3.0–12.0)
Neutro Abs: 3.4 10*3/uL (ref 1.4–7.7)
Neutrophils Relative %: 45 % (ref 43.0–77.0)
Platelets: 274 10*3/uL (ref 150.0–400.0)
RBC: 5.39 Mil/uL — ABNORMAL HIGH (ref 3.87–5.11)
RDW: 13.2 % (ref 11.5–15.5)
WBC: 7.6 10*3/uL (ref 4.0–10.5)

## 2015-04-25 LAB — HEPATIC FUNCTION PANEL
ALT: 16 U/L (ref 0–35)
AST: 14 U/L (ref 0–37)
Albumin: 4.1 g/dL (ref 3.5–5.2)
Alkaline Phosphatase: 68 U/L (ref 39–117)
BILIRUBIN DIRECT: 0.1 mg/dL (ref 0.0–0.3)
BILIRUBIN TOTAL: 0.5 mg/dL (ref 0.2–1.2)
TOTAL PROTEIN: 7.1 g/dL (ref 6.0–8.3)

## 2015-04-25 LAB — BASIC METABOLIC PANEL
BUN: 24 mg/dL — AB (ref 6–23)
CALCIUM: 9.6 mg/dL (ref 8.4–10.5)
CO2: 30 mEq/L (ref 19–32)
Chloride: 102 mEq/L (ref 96–112)
Creatinine, Ser: 0.76 mg/dL (ref 0.40–1.20)
GFR: 80.3 mL/min (ref >60.00)
Glucose, Bld: 99 mg/dL (ref 70–99)
Potassium: 4.5 mEq/L (ref 3.5–5.1)
SODIUM: 143 meq/L (ref 135–145)

## 2015-04-25 LAB — LIPID PANEL
CHOLESTEROL: 160 mg/dL (ref 0–200)
HDL: 37.6 mg/dL — ABNORMAL LOW (ref >39.00)
LDL CALC: 86 mg/dL (ref 0–99)
NONHDL: 122.1
Total CHOL/HDL Ratio: 4
Triglycerides: 181 mg/dL — ABNORMAL HIGH (ref 0.0–149.0)
VLDL: 36.2 mg/dL (ref 0.0–40.0)

## 2015-04-25 LAB — TSH: TSH: 2.88 u[IU]/mL (ref 0.35–4.50)

## 2015-04-27 ENCOUNTER — Encounter: Payer: Self-pay | Admitting: Internal Medicine

## 2015-04-27 ENCOUNTER — Ambulatory Visit (INDEPENDENT_AMBULATORY_CARE_PROVIDER_SITE_OTHER): Payer: Medicare Other | Admitting: Internal Medicine

## 2015-04-27 VITALS — BP 130/70 | Temp 98.0°F | Ht 63.25 in | Wt 184.8 lb

## 2015-04-27 DIAGNOSIS — R7303 Prediabetes: Secondary | ICD-10-CM | POA: Diagnosis not present

## 2015-04-27 DIAGNOSIS — E785 Hyperlipidemia, unspecified: Secondary | ICD-10-CM | POA: Diagnosis not present

## 2015-04-27 DIAGNOSIS — H9193 Unspecified hearing loss, bilateral: Secondary | ICD-10-CM | POA: Diagnosis not present

## 2015-04-27 DIAGNOSIS — D582 Other hemoglobinopathies: Secondary | ICD-10-CM | POA: Diagnosis not present

## 2015-04-27 DIAGNOSIS — Z0000 Encounter for general adult medical examination without abnormal findings: Secondary | ICD-10-CM

## 2015-04-27 MED ORDER — METOPROLOL TARTRATE 25 MG PO TABS: ORAL_TABLET | ORAL | Status: DC

## 2015-04-27 MED ORDER — METFORMIN HCL 500 MG PO TABS: ORAL_TABLET | ORAL | Status: DC

## 2015-04-27 MED ORDER — SIMVASTATIN 40 MG PO TABS: ORAL_TABLET | ORAL | Status: DC

## 2015-04-27 NOTE — Patient Instructions (Addendum)
Continue lifestyle intervention healthy eating and exercise . Lab  And ov in June  a1c and  cbcdiff    Health Maintenance, Female Adopting a healthy lifestyle and getting preventive care can go a long way to promote health and wellness. Talk with your health care provider about what schedule of regular examinations is right for you. This is a good chance for you to check in with your provider about disease prevention and staying healthy. In between checkups, there are plenty of things you can do on your own. Experts have done a lot of research about which lifestyle changes and preventive measures are most likely to keep you healthy. Ask your health care provider for more information. WEIGHT AND DIET  Eat a healthy diet  Be sure to include plenty of vegetables, fruits, low-fat dairy products, and lean protein.  Do not eat a lot of foods high in solid fats, added sugars, or salt.  Get regular exercise. This is one of the most important things you can do for your health.  Most adults should exercise for at least 150 minutes each week. The exercise should increase your heart rate and make you sweat (moderate-intensity exercise).  Most adults should also do strengthening exercises at least twice a week. This is in addition to the moderate-intensity exercise.  Maintain a healthy weight  Body mass index (BMI) is a measurement that can be used to identify possible weight problems. It estimates body fat based on height and weight. Your health care provider can help determine your BMI and help you achieve or maintain a healthy weight.  For females 62 years of age and older:   A BMI below 18.5 is considered underweight.  A BMI of 18.5 to 24.9 is normal.  A BMI of 25 to 29.9 is considered overweight.  A BMI of 30 and above is considered obese.  Watch levels of cholesterol and blood lipids  You should start having your blood tested for lipids and cholesterol at 68 years of age, then have this  test every 5 years.  You may need to have your cholesterol levels checked more often if:  Your lipid or cholesterol levels are high.  You are older than 68 years of age.  You are at high risk for heart disease.  CANCER SCREENING   Lung Cancer  Lung cancer screening is recommended for adults 35-19 years old who are at high risk for lung cancer because of a history of smoking.  A yearly low-dose CT scan of the lungs is recommended for people who:  Currently smoke.  Have quit within the past 15 years.  Have at least a 30-pack-year history of smoking. A pack year is smoking an average of one pack of cigarettes a day for 1 year.  Yearly screening should continue until it has been 15 years since you quit.  Yearly screening should stop if you develop a health problem that would prevent you from having lung cancer treatment.  Breast Cancer  Practice breast self-awareness. This means understanding how your breasts normally appear and feel.  It also means doing regular breast self-exams. Let your health care provider know about any changes, no matter how small.  If you are in your 20s or 30s, you should have a clinical breast exam (CBE) by a health care provider every 1-3 years as part of a regular health exam.  If you are 43 or older, have a CBE every year. Also consider having a breast X-ray (mammogram) every year.  If you have a family history of breast cancer, talk to your health care provider about genetic screening.  If you are at high risk for breast cancer, talk to your health care provider about having an MRI and a mammogram every year.  Breast cancer gene (BRCA) assessment is recommended for women who have family members with BRCA-related cancers. BRCA-related cancers include:  Breast.  Ovarian.  Tubal.  Peritoneal cancers.  Results of the assessment will determine the need for genetic counseling and BRCA1 and BRCA2 testing. Cervical Cancer Your health care  provider may recommend that you be screened regularly for cancer of the pelvic organs (ovaries, uterus, and vagina). This screening involves a pelvic examination, including checking for microscopic changes to the surface of your cervix (Pap test). You may be encouraged to have this screening done every 3 years, beginning at age 6.  For women ages 20-65, health care providers may recommend pelvic exams and Pap testing every 3 years, or they may recommend the Pap and pelvic exam, combined with testing for human papilloma virus (HPV), every 5 years. Some types of HPV increase your risk of cervical cancer. Testing for HPV may also be done on women of any age with unclear Pap test results.  Other health care providers may not recommend any screening for nonpregnant women who are considered low risk for pelvic cancer and who do not have symptoms. Ask your health care provider if a screening pelvic exam is right for you.  If you have had past treatment for cervical cancer or a condition that could lead to cancer, you need Pap tests and screening for cancer for at least 20 years after your treatment. If Pap tests have been discontinued, your risk factors (such as having a new sexual partner) need to be reassessed to determine if screening should resume. Some women have medical problems that increase the chance of getting cervical cancer. In these cases, your health care provider may recommend more frequent screening and Pap tests. Colorectal Cancer  This type of cancer can be detected and often prevented.  Routine colorectal cancer screening usually begins at 68 years of age and continues through 68 years of age.  Your health care provider may recommend screening at an earlier age if you have risk factors for colon cancer.  Your health care provider may also recommend using home test kits to check for hidden blood in the stool.  A small camera at the end of a tube can be used to examine your colon directly  (sigmoidoscopy or colonoscopy). This is done to check for the earliest forms of colorectal cancer.  Routine screening usually begins at age 63.  Direct examination of the colon should be repeated every 5-10 years through 68 years of age. However, you may need to be screened more often if early forms of precancerous polyps or small growths are found. Skin Cancer  Check your skin from head to toe regularly.  Tell your health care provider about any new moles or changes in moles, especially if there is a change in a mole's shape or color.  Also tell your health care provider if you have a mole that is larger than the size of a pencil eraser.  Always use sunscreen. Apply sunscreen liberally and repeatedly throughout the day.  Protect yourself by wearing long sleeves, pants, a wide-brimmed hat, and sunglasses whenever you are outside. HEART DISEASE, DIABETES, AND HIGH BLOOD PRESSURE   High blood pressure causes heart disease and increases the risk  of stroke. High blood pressure is more likely to develop in:  People who have blood pressure in the high end of the normal range (130-139/85-89 mm Hg).  People who are overweight or obese.  People who are African American.  If you are 77-22 years of age, have your blood pressure checked every 3-5 years. If you are 9 years of age or older, have your blood pressure checked every year. You should have your blood pressure measured twice--once when you are at a hospital or clinic, and once when you are not at a hospital or clinic. Record the average of the two measurements. To check your blood pressure when you are not at a hospital or clinic, you can use:  An automated blood pressure machine at a pharmacy.  A home blood pressure monitor.  If you are between 44 years and 53 years old, ask your health care provider if you should take aspirin to prevent strokes.  Have regular diabetes screenings. This involves taking a blood sample to check your  fasting blood sugar level.  If you are at a normal weight and have a low risk for diabetes, have this test once every three years after 68 years of age.  If you are overweight and have a high risk for diabetes, consider being tested at a younger age or more often. PREVENTING INFECTION  Hepatitis B  If you have a higher risk for hepatitis B, you should be screened for this virus. You are considered at high risk for hepatitis B if:  You were born in a country where hepatitis B is common. Ask your health care provider which countries are considered high risk.  Your parents were born in a high-risk country, and you have not been immunized against hepatitis B (hepatitis B vaccine).  You have HIV or AIDS.  You use needles to inject street drugs.  You live with someone who has hepatitis B.  You have had sex with someone who has hepatitis B.  You get hemodialysis treatment.  You take certain medicines for conditions, including cancer, organ transplantation, and autoimmune conditions. Hepatitis C  Blood testing is recommended for:  Everyone born from [birth year] through 1964.  Anyone with known risk factors for hepatitis C. Sexually transmitted infections (STIs)  You should be screened for sexually transmitted infections (STIs) including gonorrhea and chlamydia if:  You are sexually active and are younger than 68 years of age.  You are older than 68 years of age and your health care provider tells you that you are at risk for this type of infection.  Your sexual activity has changed since you were last screened and you are at an increased risk for chlamydia or gonorrhea. Ask your health care provider if you are at risk.  If you do not have HIV, but are at risk, it may be recommended that you take a prescription medicine daily to prevent HIV infection. This is called pre-exposure prophylaxis (PrEP). You are considered at risk if:  You are sexually active and do not regularly use condoms or  know the HIV status of your partner(s).  You take drugs by injection.  You are sexually active with a partner who has HIV. Talk with your health care provider about whether you are at high risk of being infected with HIV. If you choose to begin PrEP, you should first be tested for HIV. You should then be tested every 3 months for as long as you are taking PrEP.  PREGNANCY  If you are premenopausal and you may become pregnant, ask your health care provider about preconception counseling.  If you may become pregnant, take 400 to 800 micrograms (mcg) of folic acid every day.  If you want to prevent pregnancy, talk to your health care provider about birth control (contraception). OSTEOPOROSIS AND MENOPAUSE   Osteoporosis is a disease in which the bones lose minerals and strength with aging. This can result in serious bone fractures. Your risk for osteoporosis can be identified using a bone density scan.  If you are 36 years of age or older, or if you are at risk for osteoporosis and fractures, ask your health care provider if you should be screened.  Ask your health care provider whether you should take a calcium or vitamin D supplement to lower your risk for osteoporosis.  Menopause may have certain physical symptoms and risks.  Hormone replacement therapy may reduce some of these symptoms and risks. Talk to your health care provider about whether hormone replacement therapy is right for you.  HOME CARE INSTRUCTIONS   Schedule regular health, dental, and eye exams.  Stay current with your immunizations.   Do not use any tobacco products including cigarettes, chewing tobacco, or electronic cigarettes.  If you are pregnant, do not drink alcohol.  If you are breastfeeding, limit how much and how often you drink alcohol.  Limit alcohol intake to no more than 1 drink per day for nonpregnant women. One drink equals 12 ounces of beer, 5 ounces of wine, or 1 ounces of hard liquor.  Do  not use street drugs.  Do not share needles.  Ask your health care provider for help if you need support or information about quitting drugs.  Tell your health care provider if you often feel depressed.  Tell your health care provider if you have ever been abused or do not feel safe at home.   This information is not intended to replace advice given to you by your health care provider. Make sure you discuss any questions you have with your health care provider.   Document Released: 11/26/2010 Document Revised: 06/03/2014 Document Reviewed: 04/14/2013 [employer] Interactive Patient Education [employer].

## 2015-04-27 NOTE — Progress Notes (Signed)
Chief Complaint  Patient presents with  . Medicare Wellness  . Hyperlipidemia    HPI: Rebecca Mcintyre 69 y.o. comes in today for Preventive Medicare wellness visit .Since last visit. Doing well not exercising as much but feels fine without neuro cv sx  Has upcoming appt w cards   Health Maintenance  Topic Date Due  . Hepatitis C Screening  04/26/2016 (Originally 1945-11-01)  . INFLUENZA VACCINE  12/26/2015  . MAMMOGRAM  10/19/2016  . COLONOSCOPY  12/24/2016  . TETANUS/TDAP  09/13/2017  . DEXA SCAN  Completed  . ZOSTAVAX  Completed  . PNA vac Low Risk Adult  Completed   Health Maintenance Review LIFESTYLE:  TADnspcial Sugar beverages:n Sleep: 7-8   MEDICARE DOCUMENT QUESTIONS  TO SCAN    Hearing: hearing aids with bluetooth  Working great for her  Vision:  No limitations at present . Last eye check UTD  Safety:  Has smoke detector and wears seat belts.  No firearms. No excess sun exposure. Sees dentist regularly.  Falls: n  Advance directive :  Reviewed  Has one.  Memory: Felt to be good  , no concern from her or her family.  Depression: No anhedonia unusual crying or depressive symptoms  Nutrition: Eats well balanced diet; adequate calcium and vitamin D. No swallowing chewing problems.  Injury: no major injuries in the last six months.  Other healthcare providers:  Reviewed today .  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 She is independent.    ROS:  GEN/ HEENT: No fever, significant weight changes sweats headaches vision problems hearing changes, CV/ PULM; No chest pain shortness of breath cough, syncope,edema  change in exercise tolerance. GI /GU: No adominal pain, vomiting, change in bowel habits. No blood in the stool. No significant GU  symptoms. SKIN/HEME: ,no acute skin rashes suspicious lesions or bleeding. No lymphadenopathy, nodules, masses.  NEURO/ PSYCH:  No neurologic signs such as weakness numbness. No depression anxiety. IMM/ Allergy: No unusual infections.  Allergy .   REST of 12 system review negative except as per HPI   Past Medical History  Diagnosis Date  . Myocardial infarction [facility])     2002 95% proximal LAD stenosis with thrombus followed by 90% stenosis, the first diagonal 80% stenosis, circumflex 30% stenosis, circumflex obtuse marginal subbranch 70% stenosis, PDA 40% stenosis. She had a drug-eluting stent placement with a Pixel stent  . Hypertension   . Hyperlipidemia   . Gout   . Osteopenia   . Solitary kidney   . ASCUS on Pap smear     had repeat x 2 normal with [name] Plan 2011    Family History  Problem Relation Age of Onset  . Breast cancer Mother 7  . Lung cancer Father   . Heart attack Brother     Died age 102 MI  . Colon cancer Brother   . Heart disease Daughter 58    smoker and overweight     Social History   Social History  . Marital Status: Married    Spouse Name: N/A  . Number of Children: 2  . Years of Education: N/A   Occupational History  .     Social History Main Topics  . Smoking status: Former Smoker    Types: Cigarettes  .  Smokeless tobacco: None     Comment: 38 years  . Alcohol Use: Yes     Comment: 1-2 per month  . Drug Use: No  . Sexual Activity: Not Asked   Other Topics Concern  . 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 aspirin 81 MG tablet Take 81 mg by mouth daily.  . Ergocalciferol (VITAMIN D2) 400 UNITS TABS Take by mouth.    . fish oil-omega-3 fatty acids 1000 MG capsule Take 2 g by mouth daily.    . MULTIPLE VITAMIN  PO Take 1 tablet by mouth daily.   . nitroGLYCERIN (NITROSTAT) 0.4 MG SL tablet Place 1 tablet (0.4 mg total) under the tongue every 5 (five) minutes as needed.  . [DISCONTINUED] metFORMIN (GLUCOPHAGE) 500 MG tablet TAKE ONE TABLET BY MOUTH TWICE A DAY WITH MEALS  . [DISCONTINUED] metFORMIN (GLUCOPHAGE) 500 MG tablet TAKE ONE TABLET BY MOUTH TWICE A DAY WITH MEALS  . [DISCONTINUED] metoprolol tartrate (LOPRESSOR) 25 MG tablet TAKE ONE-HALF TABLET BY MOUTH TWICE DAILY  . [DISCONTINUED] metoprolol tartrate (LOPRESSOR) 25 MG tablet TAKE ONE-HALF TABLET BY MOUTH TWICE DAILY  . [DISCONTINUED] simvastatin (ZOCOR) 40 MG tablet Take one tablet by mouth at bedtime  . [DISCONTINUED] simvastatin (ZOCOR) 40 MG tablet Take one tablet by mouth at bedtime   No facility-administered encounter medications on file as of 04/27/2015.    EXAM:  BP 130/70 mmHg  Temp(Src) 98 F (36.7 C) (Oral)  Ht 5' 3.25" (1.607 m)  Wt 184 lb 12.8 oz (83.825 kg)  BMI 32.46 kg/m2  Body mass index is 32.46 kg/(m^2).  Physical Exam: Vital signs reviewed SVX:BLTJ is a well-developed well-nourished alert cooperative   who appears stated age in no acute distress.  HEENT: normocephalic atraumatic , Eyes: PERRL EOM's full, conjunctiva clear, Nares: paten,t no deformity discharge or tenderness., Ears: no deformity EAC's clear TMs with normal landmarks.hearing aids  Mouth: clear OP, no lesions, edema.  Moist mucous membranes. Dentition in adequate repair. NECK: supple without masses, thyromegaly or bruits. CHEST/PULM:  Clear to auscultation and percussion breath sounds equal no wheeze , rales or rhonchi. No chest wall deformities or tenderness. CV: PMI is nondisplaced, S1 S2 no gallops, murmurs, rubs. Peripheral pulses are full without delay.No JVD . Breast: normal by inspection . No dimpling, discharge, masses, tenderness or discharge . ABDOMEN: Bowel sounds normal nontender  No guard or rebound, no hepato splenomegal no CVA  tenderness.   Extremtities:  No clubbing cyanosis or edema, no acute joint swelling or redness no focal atrophy NEURO:  Oriented x3, cranial nerves 3-12 appear to be intact, no obvious focal weakness,gait within normal limits no abnormal reflexes or asymmetrical SKIN: No acute rashes normal turgor, color, no bruising or petechiae. PSYCH: Oriented, good eye contact, no obvious depression anxiety, cognition and judgment appear normal. LN: no cervical axillary inguinal adenopathy No noted deficits in memory, attention, and speech. Diabetic Foot Exam - Simple   Simple Foot Form  Diabetic Foot exam was performed with the following findings:  Yes 04/27/2015  8:48 AM  Visual Inspection  No deformities, no ulcerations, no other skin breakdown bilaterally:  Yes  Sensation Testing  Intact to touch and  monofilament testing bilaterally:  Yes  Pulse Check  Posterior Tibialis and Dorsalis pulse intact bilaterally:  Yes  Comments       Lab Results  Component Value Date   WBC 7.6 04/25/2015   HGB 16.0* 04/25/2015   HCT 48.0* 04/25/2015   PLT 274.0 04/25/2015   GLUCOSE 99 04/25/2015   CHOL 160 04/25/2015   TRIG 181.0* 04/25/2015   HDL 37.60* 04/25/2015   LDLCALC 86 04/25/2015   ALT 16 04/25/2015   AST 14 04/25/2015   NA 143 04/25/2015   K 4.5 04/25/2015   CL 102 04/25/2015   CREATININE 0.76 04/25/2015   BUN 24* 04/25/2015   CO2 30 04/25/2015   TSH 2.88 04/25/2015   HGBA1C 6.0 04/08/2014   MICROALBUR 3.8* 04/14/2013    ASSESSMENT AND PLAN:  Discussed the following assessment and plan:  Visit for preventive health examination  Medicare annual wellness visit, subsequent  Hyperlipidemia  Decreased hearing, bilateral - hearing aids and blutooth  Elevated hemoglobin ([place]) - ongoing see past notes  no sx wants to wait on heme referral and check at 6 months with a1c   Pre-diabetes - control presumed  cont metformin and a1c 6 months   Patient Care Team: Burnis Medin, MD as PCP -  General Paula Compton, MD as Attending Physician (Obstetrics and Gynecology) Irene Shipper, MD as Attending Physician (Gastroenterology) Sharyne Peach, MD as Consulting Physician (Ophthalmology) Minus Breeding, MD as Consulting Physician (Cardiology)  Patient Instructions  Continue lifestyle intervention healthy eating and exercise . Lab  And ov in June  a1c and  cbcdiff    Health Maintenance, Female Adopting a healthy lifestyle and getting preventive care can go a long way to promote health and wellness. Talk with your health care provider about what schedule of regular examinations is right for you. This is a good chance for you to check in with your provider about disease prevention and staying healthy. In between checkups, there are plenty of things you can do on your own. Experts have done a lot of research about which lifestyle changes and preventive measures are most likely to keep you healthy. Ask your health care provider for more information. WEIGHT AND DIET  Eat a healthy diet  Be sure to include plenty of vegetables, fruits, low-fat dairy products, and lean protein.  Do not eat a lot of foods high in solid fats, added sugars, or salt.  Get regular exercise. This is one of the most important things you can do for your health.  Most adults should exercise for at least 150 minutes each week. The exercise should increase your heart rate and make you sweat (moderate-intensity exercise).  Most adults should also do strengthening exercises at least twice a week. This is in addition to the moderate-intensity exercise.  Maintain a healthy weight  Body mass index (BMI) is a measurement that can be used to identify possible weight problems. It estimates body fat based on height and weight. Your health care provider can help determine your BMI and help you achieve or maintain a healthy weight.  For females 34 years of age and older:   A BMI below 18.5 is considered  underweight.  A BMI of 18.5 to 24.9 is normal.  A BMI of 25 to 29.9 is considered overweight.  A BMI of 30 and above is considered obese.  Watch levels of cholesterol and blood lipids  You should start having your blood tested for lipids and cholesterol at 68 years of  age, then have this test every 5 years.  You may need to have your cholesterol levels checked more often if:  Your lipid or cholesterol levels are high.  You are older than 68 years of age.  You are at high risk for heart disease.  CANCER SCREENING   Lung Cancer  Lung cancer screening is recommended for adults 48-29 years old who are at high risk for lung cancer because of a history of smoking.  A yearly low-dose CT scan of the lungs is recommended for people who:  Currently smoke.  Have quit within the past 15 years.  Have at least a 30-pack-year history of smoking. A pack year is smoking an average of one pack of cigarettes a day for 1 year.  Yearly screening should continue until it has been 15 years since you quit.  Yearly screening should stop if you develop a health problem that would prevent you from having lung cancer treatment.  Breast Cancer  Practice breast self-awareness. This means understanding how your breasts normally appear and feel.  It also means doing regular breast self-exams. Let your health care provider know about any changes, no matter how small.  If you are in your 20s or 30s, you should have a clinical breast exam (CBE) by a health care provider every 1-3 years as part of a regular health exam.  If you are 1 or older, have a CBE every year. Also consider having a breast X-ray (mammogram) every year.  If you have a family history of breast cancer, talk to your health care provider about genetic screening.  If you are at high risk for breast cancer, talk to your health care provider about having an MRI and a mammogram every year.  Breast cancer gene (BRCA) assessment is  recommended for women who have family members with BRCA-related cancers. BRCA-related cancers include:  Breast.  Ovarian.  Tubal.  Peritoneal cancers.  Results of the assessment will determine the need for genetic counseling and BRCA1 and BRCA2 testing. Cervical Cancer Your health care provider may recommend that you be screened regularly for cancer of the pelvic organs (ovaries, uterus, and vagina). This screening involves a pelvic examination, including checking for microscopic changes to the surface of your cervix (Pap test). You may be encouraged to have this screening done every 3 years, beginning at age 45.  For women ages 35-65, health care providers may recommend pelvic exams and Pap testing every 3 years, or they may recommend the Pap and pelvic exam, combined with testing for human papilloma virus (HPV), every 5 years. Some types of HPV increase your risk of cervical cancer. Testing for HPV may also be done on women of any age with unclear Pap test results.  Other health care providers may not recommend any screening for nonpregnant women who are considered low risk for pelvic cancer and who do not have symptoms. Ask your health care provider if a screening pelvic exam is right for you.  If you have had past treatment for cervical cancer or a condition that could lead to cancer, you need Pap tests and screening for cancer for at least 20 years after your treatment. If Pap tests have been discontinued, your risk factors (such as having a new sexual partner) need to be reassessed to determine if screening should resume. Some women have medical problems that increase the chance of getting cervical cancer. In these cases, your health care provider may recommend more frequent screening and Pap tests. Colorectal Cancer  This type of cancer can be detected and often prevented.  Routine colorectal cancer screening usually begins at 68 years of age and continues through 68 years of  age.  Your health care provider may recommend screening at an earlier age if you have risk factors for colon cancer.  Your health care provider may also recommend using home test kits to check for hidden blood in the stool.  A small camera at the end of a tube can be used to examine your colon directly (sigmoidoscopy or colonoscopy). This is done to check for the earliest forms of colorectal cancer.  Routine screening usually begins at age 37.  Direct examination of the colon should be repeated every 5-10 years through 68 years of age. However, you may need to be screened more often if early forms of precancerous polyps or small growths are found. Skin Cancer  Check your skin from head to toe regularly.  Tell your health care provider about any new moles or changes in moles, especially if there is a change in a mole's shape or color.  Also tell your health care provider if you have a mole that is larger than the size of a pencil eraser.  Always use sunscreen. Apply sunscreen liberally and repeatedly throughout the day.  Protect yourself by wearing long sleeves, pants, a wide-brimmed hat, and sunglasses whenever you are outside. HEART DISEASE, DIABETES, AND HIGH BLOOD PRESSURE   High blood pressure causes heart disease and increases the risk of stroke. High blood pressure is more likely to develop in:  People who have blood pressure in the high end of the normal range (130-139/85-89 mm Hg).  People who are overweight or obese.  People who are African American.  If you are 26-56 years of age, have your blood pressure checked every 3-5 years. If you are 68 years of age or older, have your blood pressure checked every year. You should have your blood pressure measured twice--once when you are at a hospital or clinic, and once when you are not at a hospital or clinic. Record the average of the two measurements. To check your blood pressure when you are not at a hospital or clinic, you can  use:  An automated blood pressure machine at a pharmacy.  A home blood pressure monitor.  If you are between 56 years and 83 years old, ask your health care provider if you should take aspirin to prevent strokes.  Have regular diabetes screenings. This involves taking a blood sample to check your fasting blood sugar level.  If you are at a normal weight and have a low risk for diabetes, have this test once every three years after 68 years of age.  If you are overweight and have a high risk for diabetes, consider being tested at a younger age or more often. PREVENTING INFECTION  Hepatitis B  If you have a higher risk for hepatitis B, you should be screened for this virus. You are considered at high risk for hepatitis B if:  You were born in a country where hepatitis B is common. Ask your health care provider which countries are considered high risk.  Your parents were born in a high-risk country, and you have not been immunized against hepatitis B (hepatitis B vaccine).  You have HIV or AIDS.  You use needles to inject street drugs.  You live with someone who has hepatitis B.  You have had sex with someone who has hepatitis B.  You get hemodialysis  treatment.  You take certain medicines for conditions, including cancer, organ transplantation, and autoimmune conditions. Hepatitis C  Blood testing is recommended for:  Everyone born from [birth year] through 1964.  Anyone with known risk factors for hepatitis C. Sexually transmitted infections (STIs)  You should be screened for sexually transmitted infections (STIs) including gonorrhea and chlamydia if:  You are sexually active and are younger than 68 years of age.  You are older than 68 years of age and your health care provider tells you that you are at risk for this type of infection.  Your sexual activity has changed since you were last screened and you are at an increased risk for chlamydia or gonorrhea. Ask your health care  provider if you are at risk.  If you do not have HIV, but are at risk, it may be recommended that you take a prescription medicine daily to prevent HIV infection. This is called pre-exposure prophylaxis (PrEP). You are considered at risk if:  You are sexually active and do not regularly use condoms or know the HIV status of your partner(s).  You take drugs by injection.  You are sexually active with a partner who has HIV. Talk with your health care provider about whether you are at high risk of being infected with HIV. If you choose to begin PrEP, you should first be tested for HIV. You should then be tested every 3 months for as long as you are taking PrEP.  PREGNANCY   If you are premenopausal and you may become pregnant, ask your health care provider about preconception counseling.  If you may become pregnant, take 400 to 800 micrograms (mcg) of folic acid every day.  If you want to prevent pregnancy, talk to your health care provider about birth control (contraception). OSTEOPOROSIS AND MENOPAUSE   Osteoporosis is a disease in which the bones lose minerals and strength with aging. This can result in serious bone fractures. Your risk for osteoporosis can be identified using a bone density scan.  If you are 28 years of age or older, or if you are at risk for osteoporosis and fractures, ask your health care provider if you should be screened.  Ask your health care provider whether you should take a calcium or vitamin D supplement to lower your risk for osteoporosis.  Menopause may have certain physical symptoms and risks.  Hormone replacement therapy may reduce some of these symptoms and risks. Talk to your health care provider about whether hormone replacement therapy is right for you.  HOME CARE INSTRUCTIONS   Schedule regular health, dental, and eye exams.  Stay current with your immunizations.   Do not use any tobacco products including cigarettes, chewing tobacco, or  electronic cigarettes.  If you are pregnant, do not drink alcohol.  If you are breastfeeding, limit how much and how often you drink alcohol.  Limit alcohol intake to no more than 1 drink per day for nonpregnant women. One drink equals 12 ounces of beer, 5 ounces of wine, or 1 ounces of hard liquor.  Do not use street drugs.  Do not share needles.  Ask your health care provider for help if you need support or information about quitting drugs.  Tell your health care provider if you often feel depressed.  Tell your health care provider if you have ever been abused or do not feel safe at home.   This information is not intended to replace advice given to you by your health care provider. Make sure  you discuss any questions you have with your health care provider.   Document Released: 11/26/2010 Document Revised: 06/03/2014 Document Reviewed: 04/14/2013 Elsevier Interactive Patient Education 2016 [place] K. Daryel Kenneth M.D.

## 2015-04-28 ENCOUNTER — Other Ambulatory Visit: Payer: Self-pay | Admitting: Family Medicine

## 2015-04-28 MED ORDER — METOPROLOL TARTRATE 25 MG PO TABS: ORAL_TABLET | ORAL | Status: DC

## 2015-04-28 MED ORDER — SIMVASTATIN 40 MG PO TABS: ORAL_TABLET | ORAL | Status: DC

## 2015-04-28 MED ORDER — METFORMIN HCL 500 MG PO TABS: ORAL_TABLET | ORAL | Status: DC

## 2015-04-28 NOTE — Telephone Encounter (Signed)
Sent to the pharmacy by e-scribe. 

## 2015-05-11 ENCOUNTER — Other Ambulatory Visit: Payer: Self-pay | Admitting: Internal Medicine

## 2015-05-11 NOTE — Telephone Encounter (Signed)
Declined.  These prescriptions were sent to [facility] on 04/28/15

## 2015-07-18 DIAGNOSIS — M76891 Other specified enthesopathies of right lower limb, excluding foot: Secondary | ICD-10-CM | POA: Diagnosis not present

## 2015-07-27 DIAGNOSIS — M76891 Other specified enthesopathies of right lower limb, excluding foot: Secondary | ICD-10-CM | POA: Diagnosis not present

## 2015-08-01 DIAGNOSIS — M76891 Other specified enthesopathies of right lower limb, excluding foot: Secondary | ICD-10-CM | POA: Diagnosis not present

## 2015-08-03 DIAGNOSIS — M76891 Other specified enthesopathies of right lower limb, excluding foot: Secondary | ICD-10-CM | POA: Diagnosis not present

## 2015-08-08 DIAGNOSIS — M76891 Other specified enthesopathies of right lower limb, excluding foot: Secondary | ICD-10-CM | POA: Diagnosis not present

## 2015-08-10 DIAGNOSIS — M76891 Other specified enthesopathies of right lower limb, excluding foot: Secondary | ICD-10-CM | POA: Diagnosis not present

## 2015-08-17 DIAGNOSIS — M7631 Iliotibial band syndrome, right leg: Secondary | ICD-10-CM | POA: Diagnosis not present

## 2015-08-17 DIAGNOSIS — M1711 Unilateral primary osteoarthritis, right knee: Secondary | ICD-10-CM | POA: Diagnosis not present

## 2015-08-22 DIAGNOSIS — M76891 Other specified enthesopathies of right lower limb, excluding foot: Secondary | ICD-10-CM | POA: Diagnosis not present

## 2015-10-13 DIAGNOSIS — M7631 Iliotibial band syndrome, right leg: Secondary | ICD-10-CM | POA: Diagnosis not present

## 2015-10-13 DIAGNOSIS — M1711 Unilateral primary osteoarthritis, right knee: Secondary | ICD-10-CM | POA: Diagnosis not present

## 2015-10-13 DIAGNOSIS — M76891 Other specified enthesopathies of right lower limb, excluding foot: Secondary | ICD-10-CM | POA: Diagnosis not present

## 2015-10-19 ENCOUNTER — Other Ambulatory Visit: Payer: Medicare Other

## 2015-10-19 ENCOUNTER — Other Ambulatory Visit: Payer: Medicare Other | Admitting: Internal Medicine

## 2015-10-24 DIAGNOSIS — Z1231 Encounter for screening mammogram for malignant neoplasm of breast: Secondary | ICD-10-CM | POA: Diagnosis not present

## 2015-10-24 DIAGNOSIS — Z803 Family history of malignant neoplasm of breast: Secondary | ICD-10-CM | POA: Diagnosis not present

## 2015-10-24 LAB — HM MAMMOGRAPHY

## 2015-10-25 ENCOUNTER — Encounter: Payer: Self-pay | Admitting: *Deleted

## 2015-10-26 ENCOUNTER — Ambulatory Visit: Payer: Medicare Other | Admitting: Internal Medicine

## 2016-02-28 ENCOUNTER — Ambulatory Visit (INDEPENDENT_AMBULATORY_CARE_PROVIDER_SITE_OTHER): Payer: Medicare Other | Admitting: Internal Medicine

## 2016-02-28 ENCOUNTER — Encounter: Payer: Self-pay | Admitting: Internal Medicine

## 2016-02-28 VITALS — BP 136/70 | Temp 98.6°F | Ht 63.25 in | Wt 187.3 lb

## 2016-02-28 DIAGNOSIS — L089 Local infection of the skin and subcutaneous tissue, unspecified: Secondary | ICD-10-CM

## 2016-02-28 DIAGNOSIS — B9689 Other specified bacterial agents as the cause of diseases classified elsewhere: Secondary | ICD-10-CM

## 2016-02-28 DIAGNOSIS — Z23 Encounter for immunization: Secondary | ICD-10-CM

## 2016-02-28 MED ORDER — CEPHALEXIN 500 MG PO CAPS
500.0000 mg | ORAL_CAPSULE | Freq: Four times a day (QID) | ORAL | 0 refills | Status: AC
Start: 2016-02-28 — End: 2016-03-09

## 2016-02-28 NOTE — Progress Notes (Signed)
Pre visit review using our clinic review tool, if applicable. No additional management support is needed unless otherwise documented below in the visit note.

## 2016-02-28 NOTE — Patient Instructions (Addendum)
This acts like a soft tissue infection near your earlobe that is causing swelling    Antibiotic and compresses     Advised .    If not  A lot better    In about 5 days    Contact us for advice . No earring until better

## 2016-02-28 NOTE — Progress Notes (Signed)
Chief Complaint  Patient presents with  . Right Earlobe Swelling    HPI: Rebecca Mcintyre 70 y.o. comes in for acute problem  2-3 weeks ago had irritation around right ear .  itche and then hurt and swelling and crusting over the last week . Peroxide and  neosp and now puffy and  soire to touch and  crusty  An last night is very swollen and used warm compresses and no better   Out of  Earring  For 1 weeks.    Hearing aid feels tight  No fever  No injury  No new piercing  ROS: See pertinent positives and negatives per HPI.no fever chills hx of same  No cv  Has dm ? Can she have a flu shot?  Past Medical History:  Diagnosis Date  . ASCUS on Pap smear    had repeat x 2 normal with [name] Plan 2011  . Gout   . Hyperlipidemia   . Hypertension   . Myocardial infarction    2002 95% proximal LAD stenosis with thrombus followed by 90% stenosis, the first diagonal 80% stenosis, circumflex 30% stenosis, circumflex obtuse marginal subbranch 70% stenosis, PDA 40% stenosis. She had a drug-eluting stent placement with a Pixel stent  . Osteopenia   . Solitary kidney     Family History  Problem Relation Age of Onset  . Breast cancer Mother 52  . Lung cancer Father   . Heart attack Brother     Died age 53 MI  . Colon cancer Brother   . Heart disease Daughter 86    smoker and overweight     Social History   Social History  . Marital status: Married    Spouse name: N/A  . Number of children: 2  . Years of education: N/A   Occupational History  .  [employer]   Social History Main Topics  . Smoking status: Former Smoker    Types: Cigarettes  . Smokeless tobacco: Never Used     Comment: 37 years  . Alcohol use Yes     Comment: 1-2 per month  . Drug use: No  . Sexual activity: Not Asked   Other Topics Concern  . None   Social History Narrative   HH of 1    Regular exercise-sometimes    6-7 hours sleep   Divorced   Occupation: Cabin crew working full time   Works with  daughter    No pets          Outpatient Medications Prior to Visit  Medication Sig Dispense Refill  . aspirin 81 MG tablet Take 81 mg by mouth daily.    . Ergocalciferol (VITAMIN D2) 400 UNITS TABS Take by mouth.      . fish oil-omega-3 fatty acids 1000 MG capsule Take 2 g by mouth daily.      . metFORMIN (GLUCOPHAGE) 500 MG tablet TAKE ONE TABLET BY MOUTH TWICE A DAY WITH MEALS 180 tablet 3  . metoprolol tartrate (LOPRESSOR) 25 MG tablet TAKE ONE-HALF TABLET BY MOUTH TWICE DAILY 90 tablet 3  . MULTIPLE VITAMIN PO Take 1 tablet by mouth daily.     . nitroGLYCERIN (NITROSTAT) 0.4 MG SL tablet Place 1 tablet (0.4 mg total) under the tongue every 5 (five) minutes as needed. 30 tablet 0  . simvastatin (ZOCOR) 40 MG tablet Take one tablet by mouth at bedtime 90 tablet 3   No facility-administered medications prior to visit.      EXAM:  BP 136/70 (BP Location: Right Arm, Patient Position: Sitting, Cuff Size: Normal)   Temp 98.6 F (37 C) (Oral)   Ht 5' 3.25" (1.607 m)   Wt 187 lb 4.8 oz (85 kg)   BMI 32.92 kg/m   Body mass index is 32.92 kg/m.  GENERAL: vitals reviewed and listed above, alert, oriented, appears well hydrated and in no acute distress HEENT: atraumatic, conjunctiva  clear,  on inspection of external nose and ears   Right ear lob edema  And  Behind ear lob fiery red  Area without abscess or weeping  OP : no lesion edema or exudate   Tm intact no erythema in ear canal NECK: no obvious masses on inspection palpation  MS: moves all extremities without noticeable focal  abnormality PSYCH: pleasant and cooperative, no obvious depression or anxiety  ASSESSMENT AND PLAN:  Discussed the following assessment and plan:  Localized bacterial skin infection - failed local rx possearly cellulitis  ,expectant managment   Need for immunization against influenza - Plan: Flu vaccine HIGH DOSE PF Cad dm stable  -Patient advised to return or notify health care team  if symptoms  worsen ,persist or new concerns arise.  Patient Instructions  This acts like a soft tissue infection near your earlobe that is causing swelling    Antibiotic and compresses     Advised .    If not  A lot better    In about 5 days    Contact us for advice . No earring until better    [employer]. Dany Walther M.D.

## 2016-03-21 ENCOUNTER — Telehealth: Payer: Self-pay | Admitting: Internal Medicine

## 2016-03-21 DIAGNOSIS — Z0000 Encounter for general adult medical examination without abnormal findings: Secondary | ICD-10-CM

## 2016-03-21 DIAGNOSIS — R7303 Prediabetes: Secondary | ICD-10-CM

## 2016-03-21 NOTE — Telephone Encounter (Signed)
Pt would like to have her CPE done the week of 12/4-8 in the A[facility]. Is okay to take two SD slots?

## 2016-03-22 NOTE — Telephone Encounter (Signed)
lmom for pt to call and resch her CPE

## 2016-03-22 NOTE — Telephone Encounter (Signed)
Pt has been scheduled.  However, would like to go to [facility] for her cpx labs.  Misty can you put the order in?  Thanks!!

## 2016-03-22 NOTE — Telephone Encounter (Signed)
Feel free to use a Tuesday or Wednesday.  Thanks!!

## 2016-03-25 NOTE — Telephone Encounter (Signed)
Left a message on identified voicemail to inform the pt that lab work has been ordered and for the [facility].  Left lab hours of 7:30 AM to 5:30 PM.  Advised a call back if any questions.

## 2016-04-15 ENCOUNTER — Other Ambulatory Visit: Payer: Self-pay | Admitting: Internal Medicine

## 2016-04-16 NOTE — Telephone Encounter (Signed)
Please send in for 90 days.  Pt has appt on 04/26/16.  Will not let me send in due to allergy.

## 2016-04-17 ENCOUNTER — Other Ambulatory Visit (INDEPENDENT_AMBULATORY_CARE_PROVIDER_SITE_OTHER): Payer: Medicare Other

## 2016-04-17 DIAGNOSIS — R7303 Prediabetes: Secondary | ICD-10-CM

## 2016-04-17 DIAGNOSIS — Z0000 Encounter for general adult medical examination without abnormal findings: Secondary | ICD-10-CM

## 2016-04-17 LAB — LIPID PANEL
Cholesterol: 158 mg/dL (ref 0–200)
HDL: 40.9 mg/dL (ref >39.00)
LDL CALC: 94 mg/dL (ref 0–99)
NONHDL: 116.73
Total CHOL/HDL Ratio: 4
Triglycerides: 113 mg/dL (ref 0.0–149.0)
VLDL: 22.6 mg/dL (ref 0.0–40.0)

## 2016-04-17 LAB — BASIC METABOLIC PANEL
BUN: 15 mg/dL (ref 6–23)
CHLORIDE: 103 meq/L (ref 96–112)
CO2: 29 meq/L (ref 19–32)
Calcium: 9.4 mg/dL (ref 8.4–10.5)
Creatinine, Ser: 0.77 mg/dL (ref 0.40–1.20)
GFR: 78.87 mL/min (ref >60.00)
Glucose, Bld: 94 mg/dL (ref 70–99)
POTASSIUM: 3.9 meq/L (ref 3.5–5.1)
SODIUM: 142 meq/L (ref 135–145)

## 2016-04-17 LAB — HEPATIC FUNCTION PANEL
ALBUMIN: 4 g/dL (ref 3.5–5.2)
ALT: 11 U/L (ref 0–35)
AST: 12 U/L (ref 0–37)
Alkaline Phosphatase: 62 U/L (ref 39–117)
Bilirubin, Direct: 0.1 mg/dL (ref 0.0–0.3)
TOTAL PROTEIN: 6.9 g/dL (ref 6.0–8.3)
Total Bilirubin: 0.5 mg/dL (ref 0.2–1.2)

## 2016-04-17 LAB — CBC WITH DIFFERENTIAL/PLATELET
Basophils Absolute: 0 10*3/uL (ref 0.0–0.1)
Basophils Relative: 0.5 % (ref 0.0–3.0)
EOS PCT: 2.5 % (ref 0.0–5.0)
Eosinophils Absolute: 0.2 10*3/uL (ref 0.0–0.7)
HEMATOCRIT: 45.5 % (ref 36.0–46.0)
HEMOGLOBIN: 15.4 g/dL — AB (ref 12.0–15.0)
LYMPHS PCT: 38.7 % (ref 12.0–46.0)
Lymphs Abs: 3 10*3/uL (ref 0.7–4.0)
MCHC: 33.7 g/dL (ref 30.0–36.0)
MCV: 87.2 fl (ref 78.0–100.0)
MONOS PCT: 9.6 % (ref 3.0–12.0)
Monocytes Absolute: 0.7 10*3/uL (ref 0.1–1.0)
Neutro Abs: 3.7 10*3/uL (ref 1.4–7.7)
Neutrophils Relative %: 48.7 % (ref 43.0–77.0)
Platelets: 253 10*3/uL (ref 150.0–400.0)
RBC: 5.22 Mil/uL — AB (ref 3.87–5.11)
RDW: 13.6 % (ref 11.5–15.5)
WBC: 7.7 10*3/uL (ref 4.0–10.5)

## 2016-04-17 LAB — TSH: TSH: 2.25 u[IU]/mL (ref 0.35–4.50)

## 2016-04-17 LAB — HEMOGLOBIN A1C: HEMOGLOBIN A1C: 6.1 % (ref 4.6–6.5)

## 2016-04-23 DIAGNOSIS — E119 Type 2 diabetes mellitus without complications: Secondary | ICD-10-CM | POA: Diagnosis not present

## 2016-04-23 LAB — HM DIABETES EYE EXAM

## 2016-04-25 NOTE — Progress Notes (Signed)
Pre visit review using our clinic review tool, if applicable. No additional management support is needed unless otherwise documented below in the visit note.  Chief Complaint  Patient presents with  . Medicare Wellness    HPI: Rebecca Mcintyre 70 y.o. comes in today for Preventive Medicare/ wellness visit  Disease management .Since last visit.Has done well. Continues on medication without significant side effects. Hasn't seen cardiology in a while but last check was good she has no cardiovascular symptoms. Her brother has passed away from metastatic colon cancer and had positive genetic testing related to breast cancer. Of note her mother died 45s of breast cancer. She is religious about colon cancer and breast cancer screening. In regard to blood sugar appears to be well on metformin no symptoms vision changes.  Health Maintenance  Topic Date Due  . Hepatitis C Screening  04/25/2017 (Originally August 20, 1945)  . COLONOSCOPY  12/24/2016  . TETANUS/TDAP  09/13/2017  . MAMMOGRAM  10/23/2017  . INFLUENZA VACCINE  Completed  . DEXA SCAN  Completed  . ZOSTAVAX  Completed  . PNA vac Low Risk Adult  Completed   Health Maintenance Review LIFESTYLE:  TAD no tob   etoh   No drugs  Sugar beverages: diet coke  Sleep:   Good sleep  About 6  exercoise  Busy life   Works full time as Cabin crew.  MEDICARE DOCUMENT QUESTIONS   declines   Filling out form  To scan      Hearing:  Aids   Vision:  No limitations at present . Last eye check UTD  Safety:  Has smoke detector and wears seat belts.  No firearms. No excess sun exposure. Sees dentist regularly.  Falls: banister  loose    At work    Advance directive :  Reviewed  Has one.  Memory: Felt to be good  , no concern from her or her family.  Depression: No anhedonia unusual crying or depressive symptoms  Nutrition: Eats well balanced diet; adequate calcium and vitamin D. No swallowing chewing problems.  Injury: no major injuries in the last  six months.  Other healthcare providers:  Reviewed today .  Social:  Lives alone  . No pets.   Preventive parameters: up-to-date  Reviewed   ADLS:   There are no problems or need for assistance  driving, feeding, obtaining food, dressing, toileting and bathing, managing money using phone. She is independent. 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    ROS:  No new sx  Numbness  Has hearing aids denies any cough shortness of breath if any is related to deconditioning chest pain sleep apnea symptoms. GEN/ HEENT: No fever, significant weight changes sweats headaches vision problems hearing changes, CV/ PULM; No chest pain shortness of breath cough, syncope,edema  change in exercise tolerance. GI /GU: No adominal pain, vomiting, change in bowel habits. No blood in the stool. No significant GU symptoms. SKIN/HEME: ,no acute skin rashes suspicious lesions or bleeding. No lymphadenopathy, nodules, masses.  NEURO/ PSYCH:  No neurologic signs such as weakness numbness. No depression anxiety. IMM/ Allergy: No unusual infections.  Allergy .   REST of 12 system review negative except as per HPI   Past Medical History:  Diagnosis Date  . ASCUS on Pap smear    had repeat x 2 normal with [name] Plan 2011  . Gout   . Hyperlipidemia   . Hypertension   . Myocardial infarction    2002 95% proximal LAD stenosis with thrombus followed  by 90% stenosis, the first diagonal 80% stenosis, circumflex 30% stenosis, circumflex obtuse marginal subbranch 70% stenosis, PDA 40% stenosis. She had a drug-eluting stent placement with a Pixel stent  . Osteopenia   . Solitary kidney     Family History  Problem Relation Age of Onset  . Breast cancer Mother 85  . Lung cancer Father   . Heart attack Brother     Died age 30 MI  . Colon cancer Brother   . Heart disease Daughter 75    smoker and overweight     Social History   Social History  . Marital status: Married    Spouse name: N/A  . Number  of children: 2  . Years of education: N/A   Occupational History  .  [employer]   Social History Main Topics  . Smoking status: Former Smoker    Types: Cigarettes  . Smokeless tobacco: Never Used     Comment: 38 years  . Alcohol use Yes     Comment: 1-2 per month  . Drug use: No  . Sexual activity: Not Asked   Other Topics Concern  . None   Social History Narrative   HH of 1    Regular exercise-sometimes    6-7 hours sleep   Divorced   Occupation: Cabin crew working full time   Works with daughter    No pets          Outpatient Encounter Prescriptions as of 04/26/2016  Medication Sig  . aspirin 81 MG tablet Take 81 mg by mouth daily.  . Ergocalciferol (VITAMIN D2) 400 UNITS TABS Take by mouth.    . fish oil-omega-3 fatty acids 1000 MG capsule Take 2 g by mouth daily.    . metFORMIN (GLUCOPHAGE) 500 MG tablet Take 1 tablet (500 mg total) by mouth 2 (two) times daily with a meal.  . metoprolol tartrate (LOPRESSOR) 25 MG tablet Take 0.5 tablets (12.5 mg total) by mouth 2 (two) times daily.  . MULTIPLE VITAMIN PO Take 1 tablet by mouth daily.   . nitroGLYCERIN (NITROSTAT) 0.4 MG SL tablet Place 1 tablet (0.4 mg total) under the tongue every 5 (five) minutes as needed.  . simvastatin (ZOCOR) 40 MG tablet Take 1 tablet (40 mg total) by mouth at bedtime.  . [DISCONTINUED] metFORMIN (GLUCOPHAGE) 500 MG tablet TAKE ONE TABLET BY MOUTH TWICE A DAY WITH MEALS  . [DISCONTINUED] metoprolol tartrate (LOPRESSOR) 25 MG tablet TAKE ONE-HALF TABLET BY MOUTH TWICE DAILY  . [DISCONTINUED] simvastatin (ZOCOR) 40 MG tablet TAKE ONE TABLET BY MOUTH AT BEDTIME  . [DISCONTINUED] metFORMIN (GLUCOPHAGE) 500 MG tablet TAKE ONE TABLET BY MOUTH TWICE A DAY WITH MEALS  . [DISCONTINUED] metoprolol tartrate (LOPRESSOR) 25 MG tablet TAKE ONE-HALF TABLET BY MOUTH TWICE DAILY  . [DISCONTINUED] simvastatin (ZOCOR) 40 MG tablet Take one tablet by mouth at bedtime   No facility-administered encounter medications  on file as of 04/26/2016.     EXAM:  BP 116/66 (BP Location: Right Arm, Patient Position: Sitting, Cuff Size: Normal)   Temp 98 F (36.7 C) (Oral)   Ht _0  (1.626 m)   Wt 188 lb 3.2 oz (85.4 kg)   BMI 32.30 kg/m   Body mass index is 32.3 kg/m.  Physical Exam: Vital signs reviewed JAS:NKNL is a well-developed well-nourished alert cooperative   who appears stated age in no acute distress.  HEENT: normocephalic atraumatic , Eyes: PERRL EOM's full, conjunctiva clear, Nares: paten,t no deformity discharge or tenderness., Ears: no  deformity  Has hearing aids in place s. Mouth: clear OP, no lesions, edema.  Moist mucous membranes. Dentition in adequate repair. NECK: supple without masses, thyromegaly or bruits. CHEST/PULM:  Clear to auscultation and percussion breath sounds equal no wheeze , rales or rhonchi.? Some decrease  No chest wall deformities or tenderness.Breast: normal by inspection . No dimpling, discharge, masses, tenderness or discharge . CV: PMI is nondisplaced, S1 S2 no gallops, murmurs, rubs. Peripheral pulses are full without delay.No JVD .  ABDOMEN: Bowel sounds normal nontender  No guard or rebound, no hepato splenomegal no CVA tenderness.   Extremtities:  No clubbing cyanosis or edema, no acute joint swelling or redness no focal atrophy NEURO:  Oriented x3, cranial nerves 3-12 appear to be intact, no obvious focal weakness,gait within normal limits no abnormal reflexes or asymmetrical SKIN: No acute rashes normal turgor, color, no bruising or petechiae. PSYCH: Oriented, good eye contact, no obvious depression anxiety, cognition and judgment appear normal. LN: no cervical axillary inguinal adenopathy No noted deficits in memory, attention, and speech. Diabetic Foot Exam - Simple   Simple Foot Form Visual Inspection No deformities, no ulcerations, no other skin breakdown bilaterally:  Yes Sensation Testing Intact to touch and monofilament testing bilaterally:   Yes Pulse Check Posterior Tibialis and Dorsalis pulse intact bilaterally:  Yes Comments      Lab Results  Component Value Date   WBC 7.7 04/17/2016   HGB 15.4 (H) 04/17/2016   HCT 45.5 04/17/2016   PLT 253.0 04/17/2016   GLUCOSE 94 04/17/2016   CHOL 158 04/17/2016   TRIG 113.0 04/17/2016   HDL 40.90 04/17/2016   LDLCALC 94 04/17/2016   ALT 11 04/17/2016   AST 12 04/17/2016   NA 142 04/17/2016   K 3.9 04/17/2016   CL 103 04/17/2016   CREATININE 0.77 04/17/2016   BUN 15 04/17/2016   CO2 29 04/17/2016   TSH 2.25 04/17/2016   HGBA1C 6.1 04/17/2016   MICROALBUR 3.8 (H) 04/14/2013   BP Readings from Last 3 Encounters:  04/26/16 116/66  02/28/16 136/70  04/27/15 130/70   Wt Readings from Last 3 Encounters:  04/26/16 188 lb 3.2 oz (85.4 kg)  02/28/16 187 lb 4.8 oz (85 kg)  04/27/15 184 lb 12.8 oz (83.8 kg)  Reviewed blood work with patient.  ASSESSMENT AND PLAN:  Discussed the following assessment and plan:  Visit for preventive health examination  Medicare annual wellness visit, subsequent  Pre-diabetes - excellent control continue  Hyperlipidemia, unspecified hyperlipidemia type - acceptable  Medication management - continue  Elevated hemoglobin (HCC) - uncertain cause  ex tobacco but no curretn sx lung disaeas of osa   Family hx of colon cancer - breast cancer mom  positive genetic markers  in brother   Prevention discussed not high risk for falls healthcare maintenance and follow-up lab in his months see below instructions. He is planning to intensify lifestyle by adding exercise. Patient Care Team: Burnis Medin, MD as PCP - General Paula Compton, MD as Attending Physician (Obstetrics and Gynecology) Irene Shipper, MD as Attending Physician (Gastroenterology) Sharyne Peach, MD as Consulting Physician (Ophthalmology) Minus Breeding, MD as Consulting Physician (Cardiology)  Patient Instructions   Continue lifestyle intervention healthy eating and  exercise .  Your blood sugar continues to be excellent. BP Readings from Last 3 Encounters:  04/26/16 116/66  02/28/16 136/70  04/27/15 130/70   No change in medications  Look into  Genetic counseling risk because of you family hx of  colon cancer breast cancer in first degree relatives .   Elevated hemaglobin can be from  Low oxygen levels in the body  Such as with a smoker , or sometimes a blood condition  . Yours has been slightly elevated  .   We can repeat your blood count in  6 months with the hg a1c.     Health Maintenance, Female Introduction Adopting a healthy lifestyle and getting preventive care can go a long way to promote health and wellness. Talk with your health care provider about what schedule of regular examinations is right for you. This is a good chance for you to check in with your provider about disease prevention and staying healthy. In between checkups, there are plenty of things you can do on your own. Experts have done a lot of research about which lifestyle changes and preventive measures are most likely to keep you healthy. Ask your health care provider for more information. Weight and diet Eat a healthy diet  Be sure to include plenty of vegetables, fruits, low-fat dairy products, and lean protein.  Do not eat a lot of foods high in solid fats, added sugars, or salt.  Get regular exercise. This is one of the most important things you can do for your health.  Most adults should exercise for at least 150 minutes each week. The exercise should increase your heart rate and make you sweat (moderate-intensity exercise).  Most adults should also do strengthening exercises at least twice a week. This is in addition to the moderate-intensity exercise. Maintain a healthy weight  Body mass index (BMI) is a measurement that can be used to identify possible weight problems. It estimates body fat based on height and weight. Your health care provider can help determine  your BMI and help you achieve or maintain a healthy weight.  For females 40 years of age and older:  A BMI below 18.5 is considered underweight.  A BMI of 18.5 to 24.9 is normal.  A BMI of 25 to 29.9 is considered overweight.  A BMI of 30 and above is considered obese. Watch levels of cholesterol and blood lipids  You should start having your blood tested for lipids and cholesterol at 69 years of age, then have this test every 5 years.  You may need to have your cholesterol levels checked more often if:  Your lipid or cholesterol levels are high.  You are older than 69 years of age.  You are at high risk for heart disease. Cancer screening Lung Cancer  Lung cancer screening is recommended for adults 27-61 years old who are at high risk for lung cancer because of a history of smoking.  A yearly low-dose CT scan of the lungs is recommended for people who:  Currently smoke.  Have quit within the past 15 years.  Have at least a 30-pack-year history of smoking. A pack year is smoking an average of one pack of cigarettes a day for 1 year.  Yearly screening should continue until it has been 15 years since you quit.  Yearly screening should stop if you develop a health problem that would prevent you from having lung cancer treatment. Breast Cancer  Practice breast self-awareness. This means understanding how your breasts normally appear and feel.  It also means doing regular breast self-exams. Let your health care provider know about any changes, no matter how small.  If you are in your 20s or 30s, you should have a clinical breast exam (CBE) by  a health care provider every 1-3 years as part of a regular health exam.  If you are 8 or older, have a CBE every year. Also consider having a breast X-ray (mammogram) every year.  If you have a family history of breast cancer, talk to your health care provider about genetic screening.  If you are at high risk for breast cancer,  talk to your health care provider about having an MRI and a mammogram every year.  Breast cancer gene (BRCA) assessment is recommended for women who have family members with BRCA-related cancers. BRCA-related cancers include:  Breast.  Ovarian.  Tubal.  Peritoneal cancers.  Results of the assessment will determine the need for genetic counseling and BRCA1 and BRCA2 testing. Cervical Cancer  Your health care provider may recommend that you be screened regularly for cancer of the pelvic organs (ovaries, uterus, and vagina). This screening involves a pelvic examination, including checking for microscopic changes to the surface of your cervix (Pap test). You may be encouraged to have this screening done every 3 years, beginning at age 47.  For women ages 22-65, health care providers may recommend pelvic exams and Pap testing every 3 years, or they may recommend the Pap and pelvic exam, combined with testing for human papilloma virus (HPV), every 5 years. Some types of HPV increase your risk of cervical cancer. Testing for HPV may also be done on women of any age with unclear Pap test results.  Other health care providers may not recommend any screening for nonpregnant women who are considered low risk for pelvic cancer and who do not have symptoms. Ask your health care provider if a screening pelvic exam is right for you.  If you have had past treatment for cervical cancer or a condition that could lead to cancer, you need Pap tests and screening for cancer for at least 20 years after your treatment. If Pap tests have been discontinued, your risk factors (such as having a new sexual partner) need to be reassessed to determine if screening should resume. Some women have medical problems that increase the chance of getting cervical cancer. In these cases, your health care provider may recommend more frequent screening and Pap tests. Colorectal Cancer  This type of cancer can be detected and often  prevented.  Routine colorectal cancer screening usually begins at 69 years of age and continues through 69 years of age.  Your health care provider may recommend screening at an earlier age if you have risk factors for colon cancer.  Your health care provider may also recommend using home test kits to check for hidden blood in the stool.  A small camera at the end of a tube can be used to examine your colon directly (sigmoidoscopy or colonoscopy). This is done to check for the earliest forms of colorectal cancer.  Routine screening usually begins at age 43.  Direct examination of the colon should be repeated every 5-10 years through 69 years of age. However, you may need to be screened more often if early forms of precancerous polyps or small growths are found. Skin Cancer  Check your skin from head to toe regularly.  Tell your health care provider about any new moles or changes in moles, especially if there is a change in a mole's shape or color.  Also tell your health care provider if you have a mole that is larger than the size of a pencil eraser.  Always use sunscreen. Apply sunscreen liberally and repeatedly throughout the  day.  Protect yourself by wearing long sleeves, pants, a wide-brimmed hat, and sunglasses whenever you are outside. Heart disease, diabetes, and high blood pressure  High blood pressure causes heart disease and increases the risk of stroke. High blood pressure is more likely to develop in:  People who have blood pressure in the high end of the normal range (130-139/85-89 mm Hg).  People who are overweight or obese.  People who are African American.  If you are 58-76 years of age, have your blood pressure checked every 3-5 years. If you are 30 years of age or older, have your blood pressure checked every year. You should have your blood pressure measured twice-once when you are at a hospital or clinic, and once when you are not at a hospital or clinic. Record  the average of the two measurements. To check your blood pressure when you are not at a hospital or clinic, you can use:  An automated blood pressure machine at a pharmacy.  A home blood pressure monitor.  If you are between 69 years and 42 years old, ask your health care provider if you should take aspirin to prevent strokes.  Have regular diabetes screenings. This involves taking a blood sample to check your fasting blood sugar level.  If you are at a normal weight and have a low risk for diabetes, have this test once every three years after 69 years of age.  If you are overweight and have a high risk for diabetes, consider being tested at a younger age or more often. Preventing infection Hepatitis B  If you have a higher risk for hepatitis B, you should be screened for this virus. You are considered at high risk for hepatitis B if:  You were born in a country where hepatitis B is common. Ask your health care provider which countries are considered high risk.  Your parents were born in a high-risk country, and you have not been immunized against hepatitis B (hepatitis B vaccine).  You have HIV or AIDS.  You use needles to inject street drugs.  You live with someone who has hepatitis B.  You have had sex with someone who has hepatitis B.  You get hemodialysis treatment.  You take certain medicines for conditions, including cancer, organ transplantation, and autoimmune conditions. Hepatitis C  Blood testing is recommended for:  Everyone born from [birth year] through 1964.  Anyone with known risk factors for hepatitis C. Sexually transmitted infections (STIs)  You should be screened for sexually transmitted infections (STIs) including gonorrhea and chlamydia if:  You are sexually active and are younger than 69 years of age.  You are older than 69 years of age and your health care provider tells you that you are at risk for this type of infection.  Your sexual activity has  changed since you were last screened and you are at an increased risk for chlamydia or gonorrhea. Ask your health care provider if you are at risk.  If you do not have HIV, but are at risk, it may be recommended that you take a prescription medicine daily to prevent HIV infection. This is called pre-exposure prophylaxis (PrEP). You are considered at risk if:  You are sexually active and do not regularly use condoms or know the HIV status of your partner(s).  You take drugs by injection.  You are sexually active with a partner who has HIV. Talk with your health care provider about whether you are at high risk of being infected  with HIV. If you choose to begin PrEP, you should first be tested for HIV. You should then be tested every 3 months for as long as you are taking PrEP. Pregnancy  If you are premenopausal and you may become pregnant, ask your health care provider about preconception counseling.  If you may become pregnant, take 400 to 800 micrograms (mcg) of folic acid every day.  If you want to prevent pregnancy, talk to your health care provider about birth control (contraception). Osteoporosis and menopause  Osteoporosis is a disease in which the bones lose minerals and strength with aging. This can result in serious bone fractures. Your risk for osteoporosis can be identified using a bone density scan.  If you are 75 years of age or older, or if you are at risk for osteoporosis and fractures, ask your health care provider if you should be screened.  Ask your health care provider whether you should take a calcium or vitamin D supplement to lower your risk for osteoporosis.  Menopause may have certain physical symptoms and risks.  Hormone replacement therapy may reduce some of these symptoms and risks. Talk to your health care provider about whether hormone replacement therapy is right for you. Follow these instructions at home:  Schedule regular health, dental, and eye  exams.  Stay current with your immunizations.  Do not use any tobacco products including cigarettes, chewing tobacco, or electronic cigarettes.  If you are pregnant, do not drink alcohol.  If you are breastfeeding, limit how much and how often you drink alcohol.  Limit alcohol intake to no more than 1 drink per day for nonpregnant women. One drink equals 12 ounces of beer, 5 ounces of wine, or 1 ounces of hard liquor.  Do not use street drugs.  Do not share needles.  Ask your health care provider for help if you need support or information about quitting drugs.  Tell your health care provider if you often feel depressed.  Tell your health care provider if you have ever been abused or do not feel safe at home. This information is not intended to replace advice given to you by your health care provider. Make sure you discuss any questions you have with your health care provider. Document Released: 11/26/2010 Document Revised: 10/19/2015 Document Reviewed: 02/14/2015  2017 [employer]vier    Mariann Laster K. Panosh M.D.

## 2016-04-26 ENCOUNTER — Ambulatory Visit (INDEPENDENT_AMBULATORY_CARE_PROVIDER_SITE_OTHER): Payer: Medicare Other | Admitting: Internal Medicine

## 2016-04-26 ENCOUNTER — Encounter: Payer: Self-pay | Admitting: Family Medicine

## 2016-04-26 ENCOUNTER — Encounter: Payer: Self-pay | Admitting: Internal Medicine

## 2016-04-26 VITALS — BP 116/66 | Temp 98.0°F | Ht 64.0 in | Wt 188.2 lb

## 2016-04-26 DIAGNOSIS — Z800 Family history of malignant neoplasm of digestive organs: Secondary | ICD-10-CM

## 2016-04-26 DIAGNOSIS — Z0000 Encounter for general adult medical examination without abnormal findings: Secondary | ICD-10-CM

## 2016-04-26 DIAGNOSIS — E785 Hyperlipidemia, unspecified: Secondary | ICD-10-CM

## 2016-04-26 DIAGNOSIS — Z79899 Other long term (current) drug therapy: Secondary | ICD-10-CM

## 2016-04-26 DIAGNOSIS — R7303 Prediabetes: Secondary | ICD-10-CM | POA: Diagnosis not present

## 2016-04-26 DIAGNOSIS — D582 Other hemoglobinopathies: Secondary | ICD-10-CM

## 2016-04-26 MED ORDER — SIMVASTATIN 40 MG PO TABS: 40.0000 mg | ORAL_TABLET | Freq: Every day | ORAL | 3 refills | Status: DC

## 2016-04-26 MED ORDER — METFORMIN HCL 500 MG PO TABS: 500.0000 mg | ORAL_TABLET | Freq: Two times a day (BID) | ORAL | 1 refills | Status: DC

## 2016-04-26 MED ORDER — METOPROLOL TARTRATE 25 MG PO TABS: 12.5000 mg | ORAL_TABLET | Freq: Two times a day (BID) | ORAL | 3 refills | Status: DC

## 2016-04-26 NOTE — Patient Instructions (Addendum)
Continue lifestyle intervention healthy eating and exercise .  Your blood sugar continues to be excellent. BP Readings from Last 3 Encounters:  04/26/16 116/66  02/28/16 136/70  04/27/15 130/70   No change in medications  Look into  Genetic counseling risk because of you family hx of colon cancer breast cancer in first degree relatives .   Elevated hemaglobin can be from  Low oxygen levels in the body  Such as with a smoker , or sometimes a blood condition  . Yours has been slightly elevated  .   We can repeat your blood count in  6 months with the hg a1c.     Health Maintenance, Female Introduction Adopting a healthy lifestyle and getting preventive care can go a long way to promote health and wellness. Talk with your health care provider about what schedule of regular examinations is right for you. This is a good chance for you to check in with your provider about disease prevention and staying healthy. In between checkups, there are plenty of things you can do on your own. Experts have done a lot of research about which lifestyle changes and preventive measures are most likely to keep you healthy. Ask your health care provider for more information. Weight and diet Eat a healthy diet  Be sure to include plenty of vegetables, fruits, low-fat dairy products, and lean protein.  Do not eat a lot of foods high in solid fats, added sugars, or salt.  Get regular exercise. This is one of the most important things you can do for your health.  Most adults should exercise for at least 150 minutes each week. The exercise should increase your heart rate and make you sweat (moderate-intensity exercise).  Most adults should also do strengthening exercises at least twice a week. This is in addition to the moderate-intensity exercise. Maintain a healthy weight  Body mass index (BMI) is a measurement that can be used to identify possible weight problems. It estimates body fat based on height and  weight. Your health care provider can help determine your BMI and help you achieve or maintain a healthy weight.  For females 31 years of age and older:  A BMI below 18.5 is considered underweight.  A BMI of 18.5 to 24.9 is normal.  A BMI of 25 to 29.9 is considered overweight.  A BMI of 30 and above is considered obese. Watch levels of cholesterol and blood lipids  You should start having your blood tested for lipids and cholesterol at 69 years of age, then have this test every 5 years.  You may need to have your cholesterol levels checked more often if:  Your lipid or cholesterol levels are high.  You are older than 69 years of age.  You are at high risk for heart disease. Cancer screening Lung Cancer  Lung cancer screening is recommended for adults 21-61 years old who are at high risk for lung cancer because of a history of smoking.  A yearly low-dose CT scan of the lungs is recommended for people who:  Currently smoke.  Have quit within the past 15 years.  Have at least a 30-pack-year history of smoking. A pack year is smoking an average of one pack of cigarettes a day for 1 year.  Yearly screening should continue until it has been 15 years since you quit.  Yearly screening should stop if you develop a health problem that would prevent you from having lung cancer treatment. Breast Cancer  Practice breast  self-awareness. This means understanding how your breasts normally appear and feel.  It also means doing regular breast self-exams. Let your health care provider know about any changes, no matter how small.  If you are in your 20s or 30s, you should have a clinical breast exam (CBE) by a health care provider every 1-3 years as part of a regular health exam.  If you are 44 or older, have a CBE every year. Also consider having a breast X-ray (mammogram) every year.  If you have a family history of breast cancer, talk to your health care provider about genetic  screening.  If you are at high risk for breast cancer, talk to your health care provider about having an MRI and a mammogram every year.  Breast cancer gene (BRCA) assessment is recommended for women who have family members with BRCA-related cancers. BRCA-related cancers include:  Breast.  Ovarian.  Tubal.  Peritoneal cancers.  Results of the assessment will determine the need for genetic counseling and BRCA1 and BRCA2 testing. Cervical Cancer  Your health care provider may recommend that you be screened regularly for cancer of the pelvic organs (ovaries, uterus, and vagina). This screening involves a pelvic examination, including checking for microscopic changes to the surface of your cervix (Pap test). You may be encouraged to have this screening done every 3 years, beginning at age 20.  For women ages 83-65, health care providers may recommend pelvic exams and Pap testing every 3 years, or they may recommend the Pap and pelvic exam, combined with testing for human papilloma virus (HPV), every 5 years. Some types of HPV increase your risk of cervical cancer. Testing for HPV may also be done on women of any age with unclear Pap test results.  Other health care providers may not recommend any screening for nonpregnant women who are considered low risk for pelvic cancer and who do not have symptoms. Ask your health care provider if a screening pelvic exam is right for you.  If you have had past treatment for cervical cancer or a condition that could lead to cancer, you need Pap tests and screening for cancer for at least 20 years after your treatment. If Pap tests have been discontinued, your risk factors (such as having a new sexual partner) need to be reassessed to determine if screening should resume. Some women have medical problems that increase the chance of getting cervical cancer. In these cases, your health care provider may recommend more frequent screening and Pap tests. Colorectal  Cancer  This type of cancer can be detected and often prevented.  Routine colorectal cancer screening usually begins at 69 years of age and continues through 69 years of age.  Your health care provider may recommend screening at an earlier age if you have risk factors for colon cancer.  Your health care provider may also recommend using home test kits to check for hidden blood in the stool.  A small camera at the end of a tube can be used to examine your colon directly (sigmoidoscopy or colonoscopy). This is done to check for the earliest forms of colorectal cancer.  Routine screening usually begins at age 2.  Direct examination of the colon should be repeated every 5-10 years through 69 years of age. However, you may need to be screened more often if early forms of precancerous polyps or small growths are found. Skin Cancer  Check your skin from head to toe regularly.  Tell your health care provider about any new  moles or changes in moles, especially if there is a change in a mole's shape or color.  Also tell your health care provider if you have a mole that is larger than the size of a pencil eraser.  Always use sunscreen. Apply sunscreen liberally and repeatedly throughout the day.  Protect yourself by wearing long sleeves, pants, a wide-brimmed hat, and sunglasses whenever you are outside. Heart disease, diabetes, and high blood pressure  High blood pressure causes heart disease and increases the risk of stroke. High blood pressure is more likely to develop in:  People who have blood pressure in the high end of the normal range (130-139/85-89 mm Hg).  People who are overweight or obese.  People who are African American.  If you are 54-70 years of age, have your blood pressure checked every 3-5 years. If you are 80 years of age or older, have your blood pressure checked every year. You should have your blood pressure measured twice-once when you are at a hospital or clinic,  and once when you are not at a hospital or clinic. Record the average of the two measurements. To check your blood pressure when you are not at a hospital or clinic, you can use:  An automated blood pressure machine at a pharmacy.  A home blood pressure monitor.  If you are between 55 years and 41 years old, ask your health care provider if you should take aspirin to prevent strokes.  Have regular diabetes screenings. This involves taking a blood sample to check your fasting blood sugar level.  If you are at a normal weight and have a low risk for diabetes, have this test once every three years after 69 years of age.  If you are overweight and have a high risk for diabetes, consider being tested at a younger age or more often. Preventing infection Hepatitis B  If you have a higher risk for hepatitis B, you should be screened for this virus. You are considered at high risk for hepatitis B if:  You were born in a country where hepatitis B is common. Ask your health care provider which countries are considered high risk.  Your parents were born in a high-risk country, and you have not been immunized against hepatitis B (hepatitis B vaccine).  You have HIV or AIDS.  You use needles to inject street drugs.  You live with someone who has hepatitis B.  You have had sex with someone who has hepatitis B.  You get hemodialysis treatment.  You take certain medicines for conditions, including cancer, organ transplantation, and autoimmune conditions. Hepatitis C  Blood testing is recommended for:  Everyone born from [birth year] through 1964.  Anyone with known risk factors for hepatitis C. Sexually transmitted infections (STIs)  You should be screened for sexually transmitted infections (STIs) including gonorrhea and chlamydia if:  You are sexually active and are younger than 69 years of age.  You are older than 69 years of age and your health care provider tells you that you are at risk  for this type of infection.  Your sexual activity has changed since you were last screened and you are at an increased risk for chlamydia or gonorrhea. Ask your health care provider if you are at risk.  If you do not have HIV, but are at risk, it may be recommended that you take a prescription medicine daily to prevent HIV infection. This is called pre-exposure prophylaxis (PrEP). You are considered at risk if:  You  are sexually active and do not regularly use condoms or know the HIV status of your partner(s).  You take drugs by injection.  You are sexually active with a partner who has HIV. Talk with your health care provider about whether you are at high risk of being infected with HIV. If you choose to begin PrEP, you should first be tested for HIV. You should then be tested every 3 months for as long as you are taking PrEP. Pregnancy  If you are premenopausal and you may become pregnant, ask your health care provider about preconception counseling.  If you may become pregnant, take 400 to 800 micrograms (mcg) of folic acid every day.  If you want to prevent pregnancy, talk to your health care provider about birth control (contraception). Osteoporosis and menopause  Osteoporosis is a disease in which the bones lose minerals and strength with aging. This can result in serious bone fractures. Your risk for osteoporosis can be identified using a bone density scan.  If you are 99 years of age or older, or if you are at risk for osteoporosis and fractures, ask your health care provider if you should be screened.  Ask your health care provider whether you should take a calcium or vitamin D supplement to lower your risk for osteoporosis.  Menopause may have certain physical symptoms and risks.  Hormone replacement therapy may reduce some of these symptoms and risks. Talk to your health care provider about whether hormone replacement therapy is right for you. Follow these instructions at  home:  Schedule regular health, dental, and eye exams.  Stay current with your immunizations.  Do not use any tobacco products including cigarettes, chewing tobacco, or electronic cigarettes.  If you are pregnant, do not drink alcohol.  If you are breastfeeding, limit how much and how often you drink alcohol.  Limit alcohol intake to no more than 1 drink per day for nonpregnant women. One drink equals 12 ounces of beer, 5 ounces of wine, or 1 ounces of hard liquor.  Do not use street drugs.  Do not share needles.  Ask your health care provider for help if you need support or information about quitting drugs.  Tell your health care provider if you often feel depressed.  Tell your health care provider if you have ever been abused or do not feel safe at home. This information is not intended to replace advice given to you by your health care provider. Make sure you discuss any questions you have with your health care provider. Document Released: 11/26/2010 Document Revised: 10/19/2015 Document Reviewed: 02/14/2015  2017 [employer]

## 2016-05-03 ENCOUNTER — Other Ambulatory Visit: Payer: Medicare Other

## 2016-05-10 ENCOUNTER — Encounter: Payer: Medicare Other | Admitting: Internal Medicine

## 2016-07-03 ENCOUNTER — Encounter: Payer: Self-pay | Admitting: Internal Medicine

## 2016-07-05 NOTE — Progress Notes (Signed)
Pre visit review using our clinic review tool, if applicable. No additional management support is needed unless otherwise documented below in the visit note.  Chief Complaint  Patient presents with  . Skin Lump    HPI: Rebecca Mcintyre 71 y.o.  New acute problem Last week noted a red spot between both breasts inferiorly. It was tender sore and red. She make an appointment to come in. In the meantime she lost a over-the-counter salve and warm compresses begin to drain get smaller. She'll most canceled her appointment today because it is getting better. No fever history of same problem. She is due for her mammogram in a few months. ROS: See pertinent positives and negatives per HPI. No fever trauma etc.  Past Medical History:  Diagnosis Date  . ASCUS on Pap smear    had repeat x 2 normal with [name] Plan 2011  . Gout   . Hyperlipidemia   . Hypertension   . Myocardial infarction    2002 95% proximal LAD stenosis with thrombus followed by 90% stenosis, the first diagonal 80% stenosis, circumflex 30% stenosis, circumflex obtuse marginal subbranch 70% stenosis, PDA 40% stenosis. She had a drug-eluting stent placement with a Pixel stent  . Osteopenia   . Solitary kidney     Family History  Problem Relation Age of Onset  . Breast cancer Mother 40  . Lung cancer Father   . Heart attack Brother     Died age 4 MI  . Colon cancer Brother   . Heart disease Daughter 51    smoker and overweight     Social History   Social History  . Marital status: Married    Spouse name: N/A  . Number of children: 2  . Years of education: N/A   Occupational History  .  [employer]   Social History Main Topics  . Smoking status: Former Smoker    Types: Cigarettes  . Smokeless tobacco: Never Used     Comment: 38 years  . Alcohol use Yes     Comment: 1-2 per month  . Drug use: No  . Sexual activity: Not Asked   Other Topics Concern  . None   Social History Narrative   HH of 1    Regular  exercise-sometimes    6-7 hours sleep   Divorced   Occupation: Cabin crew working full time   Works with daughter    No pets          Outpatient Medications Prior to Visit  Medication Sig Dispense Refill  . aspirin 81 MG tablet Take 81 mg by mouth daily.    . Ergocalciferol (VITAMIN D2) 400 UNITS TABS Take by mouth.      . fish oil-omega-3 fatty acids 1000 MG capsule Take 2 g by mouth daily.      . metFORMIN (GLUCOPHAGE) 500 MG tablet Take 1 tablet (500 mg total) by mouth 2 (two) times daily with a meal. 180 tablet 1  . metoprolol tartrate (LOPRESSOR) 25 MG tablet Take 0.5 tablets (12.5 mg total) by mouth 2 (two) times daily. 90 tablet 3  . MULTIPLE VITAMIN PO Take 1 tablet by mouth daily.     . nitroGLYCERIN (NITROSTAT) 0.4 MG SL tablet Place 1 tablet (0.4 mg total) under the tongue every 5 (five) minutes as needed. 30 tablet 0  . simvastatin (ZOCOR) 40 MG tablet Take 1 tablet (40 mg total) by mouth at bedtime. 90 tablet 3   No facility-administered medications prior to visit.  EXAM:  BP 134/60 (BP Location: Right Arm, Patient Position: Sitting, Cuff Size: Normal)   Temp 97.5 F (36.4 C) (Oral)   Ht _0  (1.626 m)   Wt 193 lb (87.5 kg)   BMI 33.13 kg/m   Body mass index is 33.13 kg/m.  GENERAL: vitals reviewed and listed above, alert, oriented, appears well hydrated and in no acute distress HEENT: atraumatic, conjunctiva  clear, no obvious abnormalities on inspection of external nose and ears Chest on the left internal transient as area near the bra line medially there is erythema and intubation is not fluctuant. There is a small amount of serous discharge. Area of redness about 1.5 cm to 2 induration inferiorly nontender. No pustules or satellite lesions. No obvious breast nodules. MS: moves all extremities without noticeable focal  abnormality PSYCH: pleasant and cooperative, no obvious depression or anxiety  ASSESSMENT AND PLAN:  Discussed the following assessment  and plan:  Skin infection - uncertain if breast associ vs skin cyst boil. but needs follow up  Warm compresses antibiotic and plan for follow-up exam to assess if any breast involvement that I doubt believe it is a skin lesion but would  Ensure  follow-up exam. -Patient advised to return or notify health care team  if symptoms worsen ,persist or new concerns arise.  Patient Instructions  This acts like skin infection or infected cyst. I don't think it is related to the breast tissue but I believe we should follow-up exam after he does treated. Use warm compresses 5-10 minutes without burning her skin 3-4 times a day. Begin antibiotic to decrease infection. Recheck exam in 3-4 weeks Contact us earlier if getting worse instead of better.    Standley Brooking. Taelyr Jantz M.D.

## 2016-07-08 ENCOUNTER — Ambulatory Visit (INDEPENDENT_AMBULATORY_CARE_PROVIDER_SITE_OTHER): Payer: Medicare Other | Admitting: Internal Medicine

## 2016-07-08 ENCOUNTER — Encounter: Payer: Self-pay | Admitting: Internal Medicine

## 2016-07-08 VITALS — BP 134/60 | Temp 97.5°F | Ht 64.0 in | Wt 193.0 lb

## 2016-07-08 DIAGNOSIS — L089 Local infection of the skin and subcutaneous tissue, unspecified: Secondary | ICD-10-CM

## 2016-07-08 MED ORDER — CEPHALEXIN 500 MG PO CAPS: 500.0000 mg | ORAL_CAPSULE | Freq: Four times a day (QID) | ORAL | 0 refills | Status: AC

## 2016-07-08 NOTE — Patient Instructions (Signed)
This acts like skin infection or infected cyst. I don't think it is related to the breast tissue but I believe we should follow-up exam after he does treated. Use warm compresses 5-10 minutes without burning her skin 3-4 times a day. Begin antibiotic to decrease infection. Recheck exam in 3-4 weeks Contact us earlier if getting worse instead of better.

## 2016-08-06 ENCOUNTER — Ambulatory Visit (INDEPENDENT_AMBULATORY_CARE_PROVIDER_SITE_OTHER): Payer: Medicare Other | Admitting: Internal Medicine

## 2016-08-06 ENCOUNTER — Encounter: Payer: Self-pay | Admitting: Internal Medicine

## 2016-08-06 ENCOUNTER — Ambulatory Visit: Payer: Medicare Other | Admitting: Internal Medicine

## 2016-08-06 VITALS — BP 130/60 | HR 87

## 2016-08-06 DIAGNOSIS — L089 Local infection of the skin and subcutaneous tissue, unspecified: Secondary | ICD-10-CM

## 2016-08-06 DIAGNOSIS — M20012 Mallet finger of left finger(s): Secondary | ICD-10-CM | POA: Diagnosis not present

## 2016-08-06 NOTE — Progress Notes (Signed)
Chief Complaint  Patient presents with  . Follow-up    HPI: Rebecca Mcintyre 71 y.o.  , fu breast exam asx  Had nodule cyst vs infection   Chest area  She states it went away soon after with warm compresses antibiotic and it drained. She has no tenderness of problem now.  New problem she noticed when she was putting on her makeup 1-2 weeks ago that her left pinky finger  Wouldn't extend straight  Or past  but doesn't remember specific injury pain or arthritis symptoms. She wasn't sure what to do about this but thought she desk when she came in. This is never happened to her before.  ROS: See pertinent positives and negatives per HPI. No bruising numbness weakness or hx of arthritis   Past Medical History:  Diagnosis Date  . ASCUS on Pap smear    had repeat x 2 normal with [name] Plan 2011  . Gout   . Hyperlipidemia   . Hypertension   . Myocardial infarction    2002 95% proximal LAD stenosis with thrombus followed by 90% stenosis, the first diagonal 80% stenosis, circumflex 30% stenosis, circumflex obtuse marginal subbranch 70% stenosis, PDA 40% stenosis. She had a drug-eluting stent placement with a Pixel stent  . Osteopenia   . Solitary kidney     Family History  Problem Relation Age of Onset  . Breast cancer Mother 37  . Lung cancer Father   . Heart attack Brother     Died age 19 MI  . Colon cancer Brother   . Heart disease Daughter 14    smoker and overweight     Social History   Social History  . Marital status: Married    Spouse name: N/A  . Number of children: 2  . Years of education: N/A   Occupational History  .  [employer]   Social History Main Topics  . Smoking status: Former Smoker    Types: Cigarettes  . Smokeless tobacco: Never Used     Comment: 37 years  . Alcohol use Yes     Comment: 1-2 per month  . Drug use: No  . Sexual activity: Not Asked   Other Topics Concern  . None   Social History Narrative   HH of 1    Regular  exercise-sometimes    6-7 hours sleep   Divorced   Occupation: Cabin crew working full time   Works with daughter    No pets          Outpatient Medications Prior to Visit  Medication Sig Dispense Refill  . aspirin 81 MG tablet Take 81 mg by mouth daily.    . Ergocalciferol (VITAMIN D2) 400 UNITS TABS Take by mouth.      . fish oil-omega-3 fatty acids 1000 MG capsule Take 2 g by mouth daily.      . metFORMIN (GLUCOPHAGE) 500 MG tablet Take 1 tablet (500 mg total) by mouth 2 (two) times daily with a meal. 180 tablet 1  . metoprolol tartrate (LOPRESSOR) 25 MG tablet Take 0.5 tablets (12.5 mg total) by mouth 2 (two) times daily. 90 tablet 3  . MULTIPLE VITAMIN PO Take 1 tablet by mouth daily.     . nitroGLYCERIN (NITROSTAT) 0.4 MG SL tablet Place 1 tablet (0.4 mg total) under the tongue every 5 (five) minutes as needed. 30 tablet 0  . simvastatin (ZOCOR) 40 MG tablet Take 1 tablet (40 mg total) by mouth at bedtime. 90 tablet 3  No facility-administered medications prior to visit.      EXAM:  BP 130/60 (BP Location: Right Arm, Patient Position: Sitting, Cuff Size: Normal)   Pulse 87   There is no height or weight on file to calculate BMI.  GENERAL: vitals reviewed and listed above, alert, oriented, appears well hydrated and in no acute distress HEENT: atraumatic, conjunctiva  clear, no obvious abnormalities on inspection of external nose and ears NECK: no obvious masses on inspection palpation   chest  Mass  Gone  Thickened area  Around bra line    Healed   No masses CV: HRRR, no clubbing cyanosis or  peripheral edema nl cap refill  MS:  Left hand left pinky with distal drop  At dip without swelling or redness    Or other deprmity of that finger .  PSYCH: pleasant and cooperative, no obvious depression or anxiety  ASSESSMENT AND PLAN:  Discussed the following assessment and plan:  Mallet finger, acquired, left - pinky  no specific injury  - Plan: Ambulatory referral to Hand  Surgery  Skin infection - resolved  Since new onset  Discovery   Advise splinting and hand referral    Expectant management. Keep finger in extension until advised otherwise by   specialist   Cyst seems better and not related to breast tissue so recheck prn and get her routine mammogram  -Patient advised to return or notify health care team  if symptoms worsen ,persist or new concerns arise.  Patient Instructions  I think this  The skin area was an infected skin cyst and not a mammogram   Will do a hand consult about   The  Left finger drop.  Mallet finger  In the interim  Keep finger splinted in extension     Mallet Finger Mallet finger is an injury that occurs from a blow to the tip of your straightened finger or thumb. It is also known as baseball finger. The blow to your fingertip causes it to bend farther than normal, which tears the cord that attaches to the tip of your finger (extensor tendon). Your extensor tendon is what straightens the end of your finger. If this tendon is damaged, you will not be able to straighten your fingertip. Sometimes, a piece of bone may be pulled away with the tendon (avulsion injury), or the tendon may tear completely. In some cases, surgery may be required to repair the damage. What are the causes? Mallet finger is caused by a hard, direct hit to the tip of your finger or thumb. This injury often happens from getting hit in the finger with a hard ball, such as a baseball. What increases the risk? This injury is more likely to happen if you play sports that use a hard ball. What are the signs or symptoms? The main symptom of this injury is not being able to straighten the tip of your finger. You can manually straighten your fingertip with your other hand, but the finger cannot straighten on its own. Other symptoms may include:  Pain.  Swelling.  Bruising.  Blood under the fingernail. How is this diagnosed? Your health care provider may suspect  mallet finger if you are not able to extend your fingertip, especially if you recently injured your hand. Your health care provider will do a physical exam. This may include X-rays to see if a piece of bone has been pulled away or if the finger joint has separated (dislocated). How is this treated? Mallet finger may  be treated with:  Wearing a splint on your fingertip to keep it straight (extended) while the tendon heals.  Surgery to repair the tendon, in severe cases. This may involve:  The use of a pin or screw to keep your finger extended and your tendon attached.  Taking a piece of tendon from another part of your body (graft) to replace a torn tendon. Follow these instructions at home:  Take medicines only as directed by your health care provider.  Wear the splint as directed by your health care provider. Remove it only as directed by your health care provider.  If you take your splint off to dry it or change it, gently press your finger on a flat surface to keep it straight.  If directed, apply ice to the injured area:  Put ice in a plastic bag.  Place a towel between your skin and the bag.  Leave the ice on for 20 minutes, 2-3 times a day.  Raise the injured area above the level of your heart while you are sitting or lying down. Contact a health care provider if:  You have pain or swelling that is getting worse.  Your finger feels cold.  You cannot extend your finger after treatment. Get help right away if:  Even after loosening your splint, your finger is:  Very red and swollen.  White or blue.  Numb or tingling. This information is not intended to replace advice given to you by your health care provider. Make sure you discuss any questions you have with your health care provider. Document Released: 05/10/2000 Document Revised: 10/19/2015 Document Reviewed: 03/16/2014 Elsevier Interactive Patient Education  2017 [place] [employer]. Kyzen Horn M.D.

## 2016-08-06 NOTE — Patient Instructions (Signed)
I think this  The skin area was an infected skin cyst and not a mammogram   Will do a hand consult about   The  Left finger drop.  Mallet finger  In the interim  Keep finger splinted in extension     Mallet Finger Mallet finger is an injury that occurs from a blow to the tip of your straightened finger or thumb. It is also known as baseball finger. The blow to your fingertip causes it to bend farther than normal, which tears the cord that attaches to the tip of your finger (extensor tendon). Your extensor tendon is what straightens the end of your finger. If this tendon is damaged, you will not be able to straighten your fingertip. Sometimes, a piece of bone may be pulled away with the tendon (avulsion injury), or the tendon may tear completely. In some cases, surgery may be required to repair the damage. What are the causes? Mallet finger is caused by a hard, direct hit to the tip of your finger or thumb. This injury often happens from getting hit in the finger with a hard ball, such as a baseball. What increases the risk? This injury is more likely to happen if you play sports that use a hard ball. What are the signs or symptoms? The main symptom of this injury is not being able to straighten the tip of your finger. You can manually straighten your fingertip with your other hand, but the finger cannot straighten on its own. Other symptoms may include:  Pain.  Swelling.  Bruising.  Blood under the fingernail. How is this diagnosed? Your health care provider may suspect mallet finger if you are not able to extend your fingertip, especially if you recently injured your hand. Your health care provider will do a physical exam. This may include X-rays to see if a piece of bone has been pulled away or if the finger joint has separated (dislocated). How is this treated? Mallet finger may be treated with:  Wearing a splint on your fingertip to keep it straight (extended) while the tendon  heals.  Surgery to repair the tendon, in severe cases. This may involve:  The use of a pin or screw to keep your finger extended and your tendon attached.  Taking a piece of tendon from another part of your body (graft) to replace a torn tendon. Follow these instructions at home:  Take medicines only as directed by your health care provider.  Wear the splint as directed by your health care provider. Remove it only as directed by your health care provider.  If you take your splint off to dry it or change it, gently press your finger on a flat surface to keep it straight.  If directed, apply ice to the injured area:  Put ice in a plastic bag.  Place a towel between your skin and the bag.  Leave the ice on for 20 minutes, 2-3 times a day.  Raise the injured area above the level of your heart while you are sitting or lying down. Contact a health care provider if:  You have pain or swelling that is getting worse.  Your finger feels cold.  You cannot extend your finger after treatment. Get help right away if:  Even after loosening your splint, your finger is:  Very red and swollen.  White or blue.  Numb or tingling. This information is not intended to replace advice given to you by your health care provider. Make sure you  discuss any questions you have with your health care provider. Document Released: 05/10/2000 Document Revised: 10/19/2015 Document Reviewed: 03/16/2014 Elsevier Interactive Patient Education  2017 [employer].

## 2016-08-07 DIAGNOSIS — M20012 Mallet finger of left finger(s): Secondary | ICD-10-CM | POA: Diagnosis not present

## 2016-08-07 DIAGNOSIS — M25649 Stiffness of unspecified hand, not elsewhere classified: Secondary | ICD-10-CM | POA: Diagnosis not present

## 2016-08-08 DIAGNOSIS — M25649 Stiffness of unspecified hand, not elsewhere classified: Secondary | ICD-10-CM | POA: Diagnosis not present

## 2016-09-02 DIAGNOSIS — M20012 Mallet finger of left finger(s): Secondary | ICD-10-CM | POA: Diagnosis not present

## 2016-09-09 DIAGNOSIS — M20012 Mallet finger of left finger(s): Secondary | ICD-10-CM | POA: Diagnosis not present

## 2016-10-07 DIAGNOSIS — M20012 Mallet finger of left finger(s): Secondary | ICD-10-CM | POA: Diagnosis not present

## 2016-10-07 DIAGNOSIS — M25649 Stiffness of unspecified hand, not elsewhere classified: Secondary | ICD-10-CM | POA: Diagnosis not present

## 2016-10-16 ENCOUNTER — Encounter: Payer: Self-pay | Admitting: Internal Medicine

## 2016-10-17 ENCOUNTER — Encounter: Payer: Self-pay | Admitting: Internal Medicine

## 2016-10-22 DIAGNOSIS — H903 Sensorineural hearing loss, bilateral: Secondary | ICD-10-CM | POA: Diagnosis not present

## 2016-10-23 DIAGNOSIS — Z803 Family history of malignant neoplasm of breast: Secondary | ICD-10-CM | POA: Diagnosis not present

## 2016-10-23 DIAGNOSIS — Z1231 Encounter for screening mammogram for malignant neoplasm of breast: Secondary | ICD-10-CM | POA: Diagnosis not present

## 2016-10-23 LAB — HM MAMMOGRAPHY

## 2016-10-28 ENCOUNTER — Encounter: Payer: Self-pay | Admitting: Family Medicine

## 2016-11-06 DIAGNOSIS — M20012 Mallet finger of left finger(s): Secondary | ICD-10-CM | POA: Diagnosis not present

## 2016-11-26 DIAGNOSIS — I219 Acute myocardial infarction, unspecified: Secondary | ICD-10-CM | POA: Insufficient documentation

## 2016-12-11 ENCOUNTER — Ambulatory Visit (AMBULATORY_SURGERY_CENTER): Payer: Self-pay

## 2016-12-11 VITALS — Ht 64.0 in | Wt 195.0 lb

## 2016-12-11 DIAGNOSIS — Z86010 Personal history of colonic polyps: Secondary | ICD-10-CM

## 2016-12-11 MED ORDER — NA SULFATE-K SULFATE-MG SULF 17.5-3.13-1.6 GM/177ML PO SOLN: 1.0000 | Freq: Once | ORAL | 0 refills | Status: AC

## 2016-12-11 NOTE — Progress Notes (Signed)
Denies allergies to eggs or soy products. Denies complication of anesthesia or sedation. Denies use of weight loss medication. Denies use of O2.   Emmi instructions declined.  

## 2016-12-12 ENCOUNTER — Encounter: Payer: Self-pay | Admitting: Internal Medicine

## 2016-12-13 DIAGNOSIS — M20012 Mallet finger of left finger(s): Secondary | ICD-10-CM | POA: Diagnosis not present

## 2016-12-25 ENCOUNTER — Encounter: Payer: Self-pay | Admitting: Internal Medicine

## 2016-12-25 ENCOUNTER — Ambulatory Visit (AMBULATORY_SURGERY_CENTER): Payer: Medicare Other | Admitting: Internal Medicine

## 2016-12-25 VITALS — BP 124/70 | HR 69 | Temp 97.3°F | Resp 13 | Ht 64.0 in | Wt 195.0 lb

## 2016-12-25 DIAGNOSIS — D124 Benign neoplasm of descending colon: Secondary | ICD-10-CM

## 2016-12-25 DIAGNOSIS — D123 Benign neoplasm of transverse colon: Secondary | ICD-10-CM | POA: Diagnosis not present

## 2016-12-25 DIAGNOSIS — K635 Polyp of colon: Secondary | ICD-10-CM | POA: Diagnosis not present

## 2016-12-25 DIAGNOSIS — D122 Benign neoplasm of ascending colon: Secondary | ICD-10-CM

## 2016-12-25 DIAGNOSIS — Z800 Family history of malignant neoplasm of digestive organs: Secondary | ICD-10-CM | POA: Diagnosis not present

## 2016-12-25 DIAGNOSIS — D126 Benign neoplasm of colon, unspecified: Secondary | ICD-10-CM | POA: Diagnosis not present

## 2016-12-25 DIAGNOSIS — Z86010 Personal history of colonic polyps: Secondary | ICD-10-CM | POA: Diagnosis present

## 2016-12-25 DIAGNOSIS — I10 Essential (primary) hypertension: Secondary | ICD-10-CM | POA: Diagnosis not present

## 2016-12-25 DIAGNOSIS — I252 Old myocardial infarction: Secondary | ICD-10-CM | POA: Diagnosis not present

## 2016-12-25 MED ORDER — SODIUM CHLORIDE 0.9 % IV SOLN: 500.0000 mL | INTRAVENOUS | Status: DC

## 2016-12-25 NOTE — Progress Notes (Signed)
  [facility] Post-op Note  Patient: Rebecca Mcintyre  Procedure(s) Performed: colonoscopy  Patient Location: [facility] - Recovery Area  Anesthesia Type: Deep Sedation/Propofol  Level of Consciousness: awake, oriented and patient cooperative  Airway and Oxygen Therapy: Patient Spontanous Breathing  Post-op Pain: none  Post-op Assessment:  Post-op Vital signs reviewed, Patient's Cardiovascular Status Stable, Respiratory Function Stable, Patent Airway, No signs of Nausea or vomiting and Pain level controlled  Post-op Vital Signs: Reviewed and stable  Complications: No apparent anesthesia complications  Alyana Kreiter E Lucely Leard 9:59 AM

## 2016-12-25 NOTE — Patient Instructions (Signed)
YOU HAD AN ENDOSCOPIC PROCEDURE TODAY AT [facility]:   Refer to the procedure report that was given to you for any specific questions about what was found during the examination.  If the procedure report does not answer your questions, please call your gastroenterologist to clarify.  If you requested that your care partner not be given the details of your procedure findings, then the procedure report has been included in a sealed envelope for you to review at your convenience later.  YOU SHOULD EXPECT: Some feelings of bloating in the abdomen. Passage of more gas than usual.  Walking can help get rid of the air that was put into your GI tract during the procedure and reduce the bloating. If you had a lower endoscopy (such as a colonoscopy or flexible sigmoidoscopy) you may notice spotting of blood in your stool or on the toilet paper. If you underwent a bowel prep for your procedure, you may not have a normal bowel movement for a few days.  Please Note:  You might notice some irritation and congestion in your nose or some drainage.  This is from the oxygen used during your procedure.  There is no need for concern and it should clear up in a day or so.  SYMPTOMS TO REPORT IMMEDIATELY:   Following lower endoscopy (colonoscopy or flexible sigmoidoscopy):  Excessive amounts of blood in the stool  Significant tenderness or worsening of abdominal pains  Swelling of the abdomen that is new, acute  Fever of 100F or higher  For urgent or emergent issues, a gastroenterologist can be reached at any hour by calling [phone].   DIET:  We do recommend a small meal at first, but then you may proceed to your regular diet.  Drink plenty of fluids but you should avoid alcoholic beverages for 24 hours.  MEDICATIONS: Continue present medications.  Please see handouts given to you by your recovery nurse.  ACTIVITY:  You should plan to take it easy for the rest of today and you should NOT  DRIVE or use heavy machinery until tomorrow (because of the sedation medicines used during the test).    FOLLOW UP: Our staff will call the number listed on your records the next business day following your procedure to check on you and address any questions or concerns that you may have regarding the information given to you following your procedure. If we do not reach you, we will leave a message.  However, if you are feeling well and you are not experiencing any problems, there is no need to return our call.  We will assume that you have returned to your regular daily activities without incident.  If any biopsies were taken you will be contacted by phone or by letter within the next 1-3 weeks.  Please call us at [phone] if you have not heard about the biopsies in 3 weeks.   Thank you for allowing [place] to provide for your healthcare needs today.   SIGNATURES/CONFIDENTIALITY: You and/or your care partner have signed paperwork which will be entered into your electronic medical record.  These signatures attest to the fact that that the information above on your After Visit Summary has been reviewed and is understood.  Full responsibility of the confidentiality of this discharge information lies with you and/or your care-partner.

## 2016-12-25 NOTE — Op Note (Signed)
[facility] Patient Name: Rebecca Mcintyre Procedure Date: 12/25/2016 9:22 AM MRN: [id number] Endoscopist: Docia Chuck. Henrene Pastor , MD Age: 71 Referring MD:  Date of Birth: Jan 15, 1946 Gender: Female Account #: [id number] Procedure:                Colonoscopy with cold snare polypectomy X 3 Indications:              High risk colon cancer surveillance: Personal                            history of non-advanced adenoma. Previous                            examinations 08-03-2004 and 04-Aug-2011. Brother deceased from                            colorectal cancer Medicines:                Monitored Anesthesia Care Procedure:                Pre-Anesthesia Assessment:                           - Prior to the procedure, a History and Physical                            was performed, and patient medications and                            allergies were reviewed. The patient's tolerance of                            previous anesthesia was also reviewed. The risks                            and benefits of the procedure and the sedation                            options and risks were discussed with the patient.                            All questions were answered, and informed consent                            was obtained. Prior Anticoagulants: The patient has                            taken no previous anticoagulant or antiplatelet                            agents. ASA Grade Assessment: II - A patient with                            mild systemic disease. After reviewing the risks  and benefits, the patient was deemed in                            satisfactory condition to undergo the procedure.                           After obtaining informed consent, the colonoscope                            was passed under direct vision. Throughout the                            procedure, the patient's blood pressure, pulse, and                            oxygen saturations were  monitored continuously. The                            Model CF-HQ190L [id number]) scope was introduced                            through the anus and advanced to the the cecum,                            identified by appendiceal orifice and ileocecal                            valve. The ileocecal valve, appendiceal orifice,                            and rectum were photographed. The quality of the                            bowel preparation was good. The colonoscopy was                            performed without difficulty. The patient tolerated                            the procedure well. The bowel preparation used was                            SUPREP. Scope In: 9:31:06 AM Scope Out: 9:51:20 AM Scope Withdrawal Time: 0 hours 15 minutes 5 seconds  Total Procedure Duration: 0 hours 20 minutes 14 seconds  Findings:                 Three polyps were found in the descending colon,                            transverse colon and ascending colon. The polyps                            were 2 to 4 mm in size. These polyps were removed  with a cold snare. Resection and retrieval were                            complete.                           Multiple small and large-mouthed diverticula were                            found in the left colon.                           Internal hemorrhoids were found during                            retroflexion. The hemorrhoids were small. Complications:            No immediate complications. Estimated blood loss:                            None. Estimated Blood Loss:     Estimated blood loss: none. Impression:               - Three 2 to 4 mm polyps in the descending colon,                            in the transverse colon and in the ascending colon,                            removed with a cold snare. Resected and retrieved.                           - Diverticulosis in the left colon.                           -  Internal hemorrhoids. Recommendation:           - Repeat colonoscopy in 5 years for surveillance.                           - Patient has a contact number available for                            emergencies. The signs and symptoms of potential                            delayed complications were discussed with the                            patient. Return to normal activities tomorrow.                            Written discharge instructions were provided to the                            patient.                           -  Resume previous diet.                           - Continue present medications.                           - Await pathology results. Docia Chuck. Henrene Pastor, MD 12/25/2016 9:56:31 AM This report has been signed electronically.

## 2016-12-25 NOTE — Progress Notes (Signed)
Pt's states no medical or surgical changes since previsit or office visit. 

## 2016-12-25 NOTE — Progress Notes (Signed)
Called to room to assist during endoscopic procedure.  Patient ID and intended procedure confirmed with present staff. Received instructions for my participation in the procedure from the performing physician.

## 2016-12-26 ENCOUNTER — Telehealth: Payer: Self-pay

## 2016-12-26 NOTE — Telephone Encounter (Signed)
  Follow up Call-  Call back number 12/25/2016  Post procedure Call Back phone  # [phone]  Permission to leave phone message Yes  Some recent data might be hidden     Patient questions:  Do you have a fever, pain , or abdominal swelling? No. Pain Score  0 *  Have you tolerated food without any problems? Yes.    Have you been able to return to your normal activities? Yes.    Do you have any questions about your discharge instructions: Diet   No. Medications  No. Follow up visit  No.  Do you have questions or concerns about your Care? No.  Actions: * If pain score is 4 or above: No action needed, pain <4.

## 2017-01-06 ENCOUNTER — Encounter: Payer: Self-pay | Admitting: Internal Medicine

## 2017-01-07 ENCOUNTER — Encounter: Payer: Self-pay | Admitting: Internal Medicine

## 2017-01-09 ENCOUNTER — Encounter: Payer: Self-pay | Admitting: Internal Medicine

## 2017-01-13 NOTE — Telephone Encounter (Signed)
   Tell patient   Dr Amedeo Plenty at  [facility] is very good but not sure  Of the treatment plan . That is the most successful for her .   Please refer   If she wishes   Please tell her to  Put the finger back in whatever splint  t to keep straight and  hyperextend in the interim

## 2017-01-17 ENCOUNTER — Telehealth: Payer: Self-pay | Admitting: Emergency Medicine

## 2017-01-17 ENCOUNTER — Other Ambulatory Visit: Payer: Self-pay | Admitting: Emergency Medicine

## 2017-01-17 DIAGNOSIS — M20019 Mallet finger of unspecified finger(s): Secondary | ICD-10-CM

## 2017-01-17 NOTE — Telephone Encounter (Signed)
Spoke with patient and she agreed to with referral and would like to know which Dermatologist you recommend? Please advise.    Referral has been placed for [facility].

## 2017-02-13 ENCOUNTER — Ambulatory Visit (INDEPENDENT_AMBULATORY_CARE_PROVIDER_SITE_OTHER)
Admission: RE | Admit: 2017-02-13 | Discharge: 2017-02-13 | Disposition: A | Discharge status: Home / Self Care | Payer: Medicare Other | Source: Ambulatory Visit | Attending: Adult Health | Admitting: Adult Health

## 2017-02-13 ENCOUNTER — Ambulatory Visit (INDEPENDENT_AMBULATORY_CARE_PROVIDER_SITE_OTHER): Payer: Medicare Other | Admitting: Adult Health

## 2017-02-13 VITALS — BP 116/60 | HR 91 | Temp 98.6°F | Wt 195.0 lb

## 2017-02-13 DIAGNOSIS — J189 Pneumonia, unspecified organism: Secondary | ICD-10-CM

## 2017-02-13 DIAGNOSIS — R0602 Shortness of breath: Secondary | ICD-10-CM | POA: Diagnosis not present

## 2017-02-13 DIAGNOSIS — R05 Cough: Secondary | ICD-10-CM | POA: Diagnosis not present

## 2017-02-13 LAB — BASIC METABOLIC PANEL
BUN: 15 mg/dL (ref 6–23)
CO2: 33 meq/L — AB (ref 19–32)
Calcium: 9.1 mg/dL (ref 8.4–10.5)
Chloride: 99 mEq/L (ref 96–112)
Creatinine, Ser: 0.85 mg/dL (ref 0.40–1.20)
GFR: 70.2 mL/min (ref >60.00)
GLUCOSE: 120 mg/dL — AB (ref 70–99)
POTASSIUM: 4.1 meq/L (ref 3.5–5.1)
SODIUM: 139 meq/L (ref 135–145)

## 2017-02-13 LAB — CBC WITH DIFFERENTIAL/PLATELET
BASOS ABS: 0 10*3/uL (ref 0.0–0.1)
Basophils Relative: 0.4 % (ref 0.0–3.0)
EOS PCT: 1.7 % (ref 0.0–5.0)
Eosinophils Absolute: 0.1 10*3/uL (ref 0.0–0.7)
HEMATOCRIT: 47.5 % — AB (ref 36.0–46.0)
HEMOGLOBIN: 15.8 g/dL — AB (ref 12.0–15.0)
LYMPHS ABS: 2.6 10*3/uL (ref 0.7–4.0)
Lymphocytes Relative: 31.4 % (ref 12.0–46.0)
MCHC: 33.1 g/dL (ref 30.0–36.0)
MCV: 90.6 fl (ref 78.0–100.0)
Monocytes Absolute: 0.9 10*3/uL (ref 0.1–1.0)
Monocytes Relative: 10.5 % (ref 3.0–12.0)
NEUTROS ABS: 4.6 10*3/uL (ref 1.4–7.7)
Neutrophils Relative %: 56 % (ref 43.0–77.0)
Platelets: 244 10*3/uL (ref 150.0–400.0)
RBC: 5.25 Mil/uL — AB (ref 3.87–5.11)
RDW: 13.6 % (ref 11.5–15.5)
WBC: 8.2 10*3/uL (ref 4.0–10.5)

## 2017-02-13 MED ORDER — PREDNISONE 10 MG PO TABS: ORAL_TABLET | ORAL | 0 refills | Status: DC

## 2017-02-13 MED ORDER — IPRATROPIUM-ALBUTEROL 0.5-2.5 (3) MG/3ML IN SOLN
3.0000 mL | Freq: Once | RESPIRATORY_TRACT | Status: AC
  Administered 2017-02-13: 3 mL via RESPIRATORY_TRACT

## 2017-02-13 MED ORDER — LEVOFLOXACIN 750 MG PO TABS: 750.0000 mg | ORAL_TABLET | Freq: Every day | ORAL | 0 refills | Status: DC

## 2017-02-13 MED ORDER — ALBUTEROL SULFATE HFA 108 (90 BASE) MCG/ACT IN AERS: 2.0000 | INHALATION_SPRAY | Freq: Four times a day (QID) | RESPIRATORY_TRACT | 2 refills | Status: DC | PRN

## 2017-02-13 NOTE — Addendum Note (Signed)
Addended by: Tomi Likens on: 02/13/2017 02:28 PM   Modules accepted: Orders

## 2017-02-13 NOTE — Progress Notes (Signed)
Subjective:    Patient ID: Rebecca Mcintyre, female    DOB: 02-27-1946, 71 y.o.   MRN: [id number]  HPI  71 year old female who  has a past medical history of Allergy; ASCUS on Pap smear; Gout; Hyperlipidemia; Hypertension; Myocardial infarction ([place]); Osteopenia; and Solitary kidney. She is a patient of [name] who I am seeing today for one week of shortness of breath, wheezing, productive cough, and generalized fatigue.   On presentation to the office her oxygen saturation was 79% on RA.   She is a former smoker.   She denies any fevers, n/v/d   Review of Systems See HPI   Past Medical History:  Diagnosis Date  . Allergy   . ASCUS on Pap smear    had repeat x 2 normal with [name] Plan 2011  . Gout   . Hyperlipidemia   . Hypertension   . Myocardial infarction [facility])    2002 95% proximal LAD stenosis with thrombus followed by 90% stenosis, the first diagonal 80% stenosis, circumflex 30% stenosis, circumflex obtuse marginal subbranch 70% stenosis, PDA 40% stenosis. She had a drug-eluting stent placement with a Pixel stent  . Osteopenia   . Solitary kidney     Social History   Social History  . Marital status: Married    Spouse name: N/A  . Number of children: 2  . Years of education: N/A   Occupational History  .  [employer]   Social History Main Topics  . Smoking status: Former Smoker    Types: Cigarettes  . Smokeless tobacco: Never Used     Comment: 37 years  . Alcohol use Yes     Comment: 1-2 per month  . Drug use: No  . Sexual activity: Not on file   Other Topics Concern  . 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 Valentine  2011  . LAD Stent angioplasty  2002  . Right oophorectomy with salpingectomy     benign growth Dr. Joneen Caraway     Family History  Problem Relation Age of Onset  . Breast cancer Mother 25  . Lung cancer Father   . Heart attack Brother        Died age 32 MI  . Colon cancer Brother   . Heart disease Daughter 51       smoker and overweight   . Esophageal cancer Neg Hx   . Pancreatic cancer Neg Hx   . Rectal cancer Neg Hx   . Stomach cancer Neg Hx     Allergies  Allergen Reactions  . Atorvastatin     REACTION: myalgias  and liver    Current Outpatient Prescriptions on File Prior to Visit  Medication Sig Dispense Refill  . aspirin 81 MG tablet Take 81 mg by mouth daily.    . metFORMIN (GLUCOPHAGE) 500 MG tablet Take 1 tablet (500 mg total) by mouth 2 (two) times daily with a meal. 180 tablet 1  . metoprolol tartrate (LOPRESSOR) 25 MG tablet Take 0.5 tablets (12.5 mg total) by mouth 2 (two) times daily. 90 tablet 3  . MULTIPLE VITAMIN PO Take 1 tablet by mouth daily.     [name]  nitroGLYCERIN (NITROSTAT) 0.4 MG SL tablet Place 1 tablet (0.4 mg total) under the tongue every 5 (five) minutes as needed. 30 tablet 0  . simvastatin (ZOCOR) 40 MG tablet Take 1 tablet (40 mg total) by mouth at bedtime. 90 tablet 3   No current facility-administered medications on file prior to visit.     BP 116/60 (BP Location: Right Arm)   Pulse 91   Temp 98.6 F (37 C) (Oral)   Wt 195 lb (88.5 kg)   SpO2 (!) 79%   BMI 33.47 kg/m       Objective:   Physical Exam  Constitutional: She is oriented to person, place, and time. She appears well-developed and well-nourished. No distress.  HENT:  Head: Normocephalic and atraumatic.  Right Ear: External ear normal.  Left Ear: External ear normal.  Nose: Nose normal.  Mouth/Throat: Oropharynx is clear and moist.  Eyes: Pupils are equal, round, and reactive to light. Conjunctivae and EOM are normal. Right eye exhibits no discharge. Left eye exhibits no discharge. No scleral icterus.  Cardiovascular: Normal rate, regular rhythm, normal heart sounds and intact distal  pulses.  Exam reveals no gallop and no friction rub.   No murmur heard. Pulmonary/Chest: Effort normal. No respiratory distress. She has wheezes in the right upper field, the right middle field, the right lower field, the left upper field and the left middle field. She has rhonchi in the right upper field, the right middle field, the right lower field, the left upper field, the left middle field and the left lower field. She has no rales. She exhibits no tenderness.  Neurological: She is alert and oriented to person, place, and time.  Skin: Skin is warm and dry. No rash noted. She is not diaphoretic. No erythema. No pallor.  Psychiatric: She has a normal mood and affect. Her behavior is normal. Judgment and thought content normal.  Nursing note and vitals reviewed.     Assessment & Plan:  1. Pneumonia due to infectious organism, unspecified laterality, unspecified part of lung - ipratropium-albuterol (DUONEB) 0.5-2.5 (3) MG/3ML nebulizer solution 3 mL; Take 3 mLs by nebulization once. - CBC with Differential/Platelet - Basic metabolic panel; Future - DG Chest 2 View; Future - predniSONE (DELTASONE) 10 MG tablet; 40 mg x 3 days, 20 mg x 3 days, 10 mg x 3 days  Dispense: 21 tablet; Refill: 0 - levofloxacin (LEVAQUIN) 750 MG tablet; Take 1 tablet (750 mg total) by mouth daily.  Dispense: 7 tablet; Refill: 0 - albuterol (PROVENTIL HFA;VENTOLIN HFA) 108 (90 Base) MCG/ACT inhaler; Inhale 2 puffs into the lungs every 6 (six) hours as needed for wheezing or shortness of breath.  Dispense: 1 Inhaler; Refill: 2  - She felt improved after 10 minutes on 1 liter of o2 via [place].  - She felt improved after dup neb. Wheezing had resolved in right lung fields but continued to have wheezing and rhonchi in left full fields.   - She was taken off oxygen after nebulizer treatment. She stayed steady at 91% with good wave form.  - She does not want to go to the ER.   I am going to treat her for pneumonia.  -  Advised to go to the ER if SOB becomes worse over the course of the next 24 hours  Dorothyann Peng, NP

## 2017-02-14 ENCOUNTER — Encounter: Payer: Self-pay | Admitting: Adult Health

## 2017-02-14 ENCOUNTER — Encounter: Payer: Self-pay | Admitting: Internal Medicine

## 2017-02-18 ENCOUNTER — Other Ambulatory Visit: Payer: Self-pay | Admitting: Adult Health

## 2017-02-18 DIAGNOSIS — J189 Pneumonia, unspecified organism: Secondary | ICD-10-CM

## 2017-02-27 NOTE — Progress Notes (Signed)
Chief Complaint  Patient presents with  . Follow-up    Pt states the medicine has work but lately she is still having some sinus pressure and her ears are popping and lots of drainage from her nose     HPI: Rebecca Mcintyre 71 y.o. come in for fu of episode of wheezing sob and  pna rx  levaquine pred  And albuterol  Poss atypical pna  Vs bronchitis .  Better   After a week but this week having increased sx  Of sinuses and nasal congestsion    But lots of pressure in head   And  drinaing and coughing    Tired  In afternoon.    In 2 pm    Face now hurts and feels puffy.    pred and antibiotic  Helped a lot . but off  Now and "  Not a good week.  This week"  No sob now in chest  Using mucinex  No fever.  Has ben usig home pulse ox and increasing   ROS: See pertinent positives and negatives per HPI. No v d fever cant breath through nose  No sneezing itching  hasnt been checking her BG.   Past Medical History:  Diagnosis Date  . Allergy   . ASCUS on Pap smear    had repeat x 2 normal with [name] Plan 2011  . Gout   . Hyperlipidemia   . Hypertension   . Myocardial infarction [facility])    2002 95% proximal LAD stenosis with thrombus followed by 90% stenosis, the first diagonal 80% stenosis, circumflex 30% stenosis, circumflex obtuse marginal subbranch 70% stenosis, PDA 40% stenosis. She had a drug-eluting stent placement with a Pixel stent  . Osteopenia   . Solitary kidney     Family History  Problem Relation Age of Onset  . Breast cancer Mother 68  . Lung cancer Father   . Heart attack Brother        Died age 87 MI  . Colon cancer Brother   . Heart disease Daughter 17       smoker and overweight   . Esophageal cancer Neg Hx   . Pancreatic cancer Neg Hx   . Rectal cancer Neg Hx   . Stomach cancer Neg Hx     Social History   Social History  . Marital status: Married    Spouse name: N/A  . Number of children: 2  . Years of education: N/A   Occupational History  .   [employer]   Social History Main Topics  . Smoking status: Former Smoker    Types: Cigarettes  . Smokeless tobacco: Never Used     Comment: 37 years  . Alcohol use Yes     Comment: 1-2 per month  . Drug use: No  . Sexual activity: Not Asked   Other Topics Concern  . 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 albuterol (PROVENTIL HFA;VENTOLIN HFA) 108 (90 Base) MCG/ACT inhaler Inhale 2 puffs into the lungs every 6 (six) hours as needed for wheezing or shortness of breath. 1 Inhaler 2  . aspirin 81 MG tablet Take 81 mg by mouth  daily.    . metFORMIN (GLUCOPHAGE) 500 MG tablet Take 1 tablet (500 mg total) by mouth 2 (two) times daily with a meal. 180 tablet 1  . metoprolol tartrate (LOPRESSOR) 25 MG tablet Take 0.5 tablets (12.5 mg total) by mouth 2 (two) times daily. 90 tablet 3  . MULTIPLE VITAMIN PO Take 1 tablet by mouth daily.     . nitroGLYCERIN (NITROSTAT) 0.4 MG SL tablet Place 1 tablet (0.4 mg total) under the tongue every 5 (five) minutes as needed. 30 tablet 0  . simvastatin (ZOCOR) 40 MG tablet Take 1 tablet (40 mg total) by mouth at bedtime. 90 tablet 3  . levofloxacin (LEVAQUIN) 750 MG tablet Take 1 tablet (750 mg total) by mouth daily. (Patient not taking: Reported on 02/28/2017) 7 tablet 0  . predniSONE (DELTASONE) 10 MG tablet 40 mg x 3 days, 20 mg x 3 days, 10 mg x 3 days (Patient not taking: Reported on 02/28/2017) 21 tablet 0   No facility-administered medications prior to visit.      EXAM:  BP 122/70 (BP Location: Right Arm, Patient Position: Sitting, Cuff Size: Normal)   Pulse 85   Temp (!) 97.4 F (36.3 C) (Oral)   Ht _0  (1.626 m)   Wt 196 lb 3.2 oz (89 kg)   SpO2 93%   BMI 33.68 kg/m   Body mass index is 33.68 kg/m. WDWN in NAD   quiet respirations;   Very nasally congested  somewhat hoarse. Non toxic . HEENT: Normocephalic ;atraumatic , Eyes;  PERRL, EOMs  Full, lids and conjunctiva clear,,Ears: no deformities, canals nl, TM landmarks normal, Nose: no deformity or discharge but3+ congested;face max and frontal  tender Mouth : OP clear without lesion or edema . Neck: Supple without adenopathy or masses or bruits Chest:  Clear to A&P without wheezes rales or rhonchi but dec bs throughout  CV:  S1-S2 no gallops or murmurs peripheral perfusion is normal Skin :nl perfusion and no acute rashes   PSYCH: pleasant and cooperative, no obvious depression or anxiety Lab Results  Component Value Date   WBC 8.2 02/13/2017   HGB 15.8 (H) 02/13/2017   HCT 47.5 (H) 02/13/2017   PLT 244.0 02/13/2017   GLUCOSE 120 (H) 02/13/2017   CHOL 158 04/17/2016   TRIG 113.0 04/17/2016   HDL 40.90 04/17/2016   LDLCALC 94 04/17/2016   ALT 11 04/17/2016   AST 12 04/17/2016   NA 139 02/13/2017   K 4.1 02/13/2017   CL 99 02/13/2017   CREATININE 0.85 02/13/2017   BUN 15 02/13/2017   CO2 33 (H) 02/13/2017   TSH 2.25 04/17/2016   HGBA1C 6.1 04/17/2016   MICROALBUR 3.8 (H) 04/14/2013   BP Readings from Last 3 Encounters:  02/28/17 122/70  02/13/17 116/60  12/25/16 124/70   c xray FINDINGS: Grossly unchanged cardiac silhouette and mediastinal contours with atherosclerotic plaque within the thoracic aorta. The lungs remain hyperexpanded with flattening the diaphragms and mild thinning of the biapical pulmonary parenchyma. Worsening diffuse though perihilar predominant slightly nodular interstitial thickening. Minimal bibasilar heterogeneous opacities, left greater than right, similar to the 05/2012 examination and favored to represent atelectasis or scar. No new focal airspace opacities. No pleural effusion or pneumothorax. No evidence of edema. No acute osseus abnormalities.  IMPRESSION: Findings most suggestive of airways  disease/bronchitis, though note, atypical infection could have a similar appearance. A follow-up chest radiograph in 3 to 4 weeks after treatment is recommended to ensure resolution.   Electronically Signed  By: Sandi Mariscal M.D.   On: 02/14/2017 08:31 ASSESSMENT AND PLAN:  Discussed the following assessment and plan:  Acute non-recurrent sinusitis, unspecified location  Pneumonia due to infectious organism, unspecified laterality, unspecified part of lung Treated for pneumonitis and atypicals and wheezing reactive with prednisone. Most of her symptoms now appear to be in the upper respiratory tract with significant symptoms of sinusitis face pain and severe nasal congestion. This could be viral but less likely and will treat for relapsing symptoms. Augmentin and short course prednisone 3-5 days nasal steroids Afrin as needed and nasal saline.And nasal cortisone off of the prednisone. Expectant management keep appointment for x-ray follow-up next week Consider pulmonary function tests when she is better.he is an ex-smoker.   -Patient advised to return or notify health care team  if  new concerns arise.  Patient Instructions   This acts like a relapsing sinusitis. I believe that your chest infection is getting better. Take antibioticand short course of prednisone  3-5 days  Begin nasal cortisone such as Flonase or Nasacort or similar and take it every day until better. This may help treat the sinusitis inflammation. You continue saline nose spray  Afrin nose spray temporarily to open up the nasal passages but no more than 3 days in a row.  Get x ray next week as planned and  cpx as planned  Expect  You to feel better in the next 3-4 days   Contact us if fever  Other concerns  Your ears are not infected       Mariann Laster K. Panosh M.D.

## 2017-02-28 ENCOUNTER — Ambulatory Visit (INDEPENDENT_AMBULATORY_CARE_PROVIDER_SITE_OTHER): Payer: Medicare Other | Admitting: Internal Medicine

## 2017-02-28 ENCOUNTER — Encounter: Payer: Self-pay | Admitting: Internal Medicine

## 2017-02-28 VITALS — BP 122/70 | HR 85 | Temp 97.4°F | Ht 64.0 in | Wt 196.2 lb

## 2017-02-28 DIAGNOSIS — J189 Pneumonia, unspecified organism: Secondary | ICD-10-CM

## 2017-02-28 DIAGNOSIS — J019 Acute sinusitis, unspecified: Secondary | ICD-10-CM | POA: Diagnosis not present

## 2017-02-28 MED ORDER — AMOXICILLIN-POT CLAVULANATE 875-125 MG PO TABS: 1.0000 | ORAL_TABLET | Freq: Two times a day (BID) | ORAL | 0 refills | Status: DC

## 2017-02-28 MED ORDER — PREDNISONE 20 MG PO TABS: 20.0000 mg | ORAL_TABLET | Freq: Two times a day (BID) | ORAL | 0 refills | Status: DC

## 2017-02-28 NOTE — Patient Instructions (Addendum)
  This acts like a relapsing sinusitis. I believe that your chest infection is getting better. Take antibioticand short course of prednisone  3-5 days  Begin nasal cortisone such as Flonase or Nasacort or similar and take it every day until better. This may help treat the sinusitis inflammation. You continue saline nose spray  Afrin nose spray temporarily to open up the nasal passages but no more than 3 days in a row.  Get x ray next week as planned and  cpx as planned  Expect  You to feel better in the next 3-4 days   Contact us if fever  Other concerns  Your ears are not infected

## 2017-03-07 NOTE — Progress Notes (Signed)
Chief Complaint  Patient presents with  . Annual Exam    Pt still having congested cough and nasal congestion. Denies fever.     HPI:  Rebecca Mcintyre 71 y.o. comes in today for Preventive Medicare exam/ wellness visit  And  Med management and fu of  resp infection and cough . Marland KitchenSince last visit.Took the prednisone in the antibiotic and is some better but still has nasal congestion but no sinus pain. It is hard for her to hear even with her hearing aids because her ears keep popping. She does have some sneezing but not itching.  ? Derm   On face scratchy area.      Sis   Lung cancer  And leaky valve lung cancer was discovered because she was in a study for her heart. Had surgery Dr. Roxan Hockey.  Finger   . Having a hard time getting into a new clinic despite faxed records hasn't heard anything yet. She had a mallet finger that failed treatment on the left pinky.   Health Maintenance  Topic Date Due  . INFLUENZA VACCINE  12/25/2016  . Hepatitis C Screening  04/25/2017 (Originally [birth year])  . TETANUS/TDAP  09/13/2017  . MAMMOGRAM  10/24/2018  . COLONOSCOPY  12/25/2021  . DEXA SCAN  Completed  . PNA vac Low Risk Adult  Completed   Health Maintenance Review LIFESTYLE:  Exercise:   Mostly walking  Tobacco/ETS:   no Alcohol:   Very little  Sugar beverages:   Diet  Coke  Sleep: 6-7 hours  Drug use: no HH: 1  No pets  Work  : 6 - 7    Hearing:  Aids   Vision:  No limitations at present . Last eye check UTD  Safety:  Has smoke detector and wears seat belts.  No firearms. No excess sun exposure. Sees dentist regularly.  Falls:  no  Advance directive :  Reviewed   Memory: Felt to be good  , no concern from her or her family.  Depression: No anhedonia unusual crying or depressive symptoms  Nutrition: Eats well balanced diet; adequate calcium and vitamin D. No swallowing chewing problems.  Injury: no major injuries in the last six months.  Other healthcare providers:   Reviewed today .  Social:  Lives aloine No pets. She is a Airline pilot parameters: up-to-date  Reviewed   ADLS:   There are no problems or need for assistance  driving, feeding, obtaining food, dressing, toileting and bathing, managing money using phone. She is independent.   ROS:  GEN/ HEENT: No fever, significant weight changes sweats headaches vision problemsCV/ PULM; No chest pain shortness of breath cough, syncope,edema  change in exercise tolerance. GI /GU: No adominal pain, vomiting, change in bowel habits. No blood in the stool. No significant GU symptoms. SKIN/HEME: ,no acute skin rashes suspicious lesions or bleeding. falky area on face  No lymphadenopathy, nodules, masses.  NEURO/ PSYCH:  No  New neurologic signs such as weakness numbness. No depression anxiety. IMM/ Allergy: No unusual infections.  Allergy .   REST of 12 system review negative except as per HPI   Past Medical History:  Diagnosis Date  . Allergy   . ASCUS on Pap smear    had repeat x 2 normal with [name] Plan 2011  . Gout   . Hyperlipidemia   . Hypertension   . Myocardial infarction [facility])    2002 95% proximal LAD stenosis with thrombus followed by 90% stenosis, the first diagonal  80% stenosis, circumflex 30% stenosis, circumflex obtuse marginal subbranch 70% stenosis, PDA 40% stenosis. She had a drug-eluting stent placement with a Pixel stent  . Osteopenia   . Solitary kidney     Family History  Problem Relation Age of Onset  . Breast cancer Mother 48  . Lung cancer Father   . Heart attack Brother        Died age 75 MI  . Colon cancer Brother   . Heart disease Daughter 40       smoker and overweight   . Esophageal cancer Neg Hx   . Pancreatic cancer Neg Hx   . Rectal cancer Neg Hx   . Stomach cancer Neg Hx     Social History   Social History  . Marital status: Married    Spouse name: N/A  . Number of children: 2  . Years of education: N/A   Occupational History  .   [employer]   Social History Main Topics  . Smoking status: Former Smoker    Types: Cigarettes  . Smokeless tobacco: Never Used     Comment: 38 years  . Alcohol use Yes     Comment: 1-2 per month  . Drug use: No  . Sexual activity: Not Asked   Other Topics Concern  . None   Social History Narrative   HH of 1    Regular exercise-sometimes    6-7 hours sleep   Divorced   Occupation: Cabin crew working full time   Works with daughter    No pets          Outpatient Encounter Prescriptions as of 03/10/2017  Medication Sig  . albuterol (PROVENTIL HFA;VENTOLIN HFA) 108 (90 Base) MCG/ACT inhaler Inhale 2 puffs into the lungs every 6 (six) hours as needed for wheezing or shortness of breath.  [name] aspirin 81 MG tablet Take 81 mg by mouth daily.  . metFORMIN (GLUCOPHAGE) 500 MG tablet Take 1 tablet (500 mg total) by mouth 2 (two) times daily with a meal.  . metoprolol tartrate (LOPRESSOR) 25 MG tablet Take 0.5 tablets (12.5 mg total) by mouth 2 (two) times daily.  . MULTIPLE VITAMIN PO Take 1 tablet by mouth daily.   . nitroGLYCERIN (NITROSTAT) 0.4 MG SL tablet Place 1 tablet (0.4 mg total) under the tongue every 5 (five) minutes as needed.  . simvastatin (ZOCOR) 40 MG tablet Take 1 tablet (40 mg total) by mouth at bedtime.  . montelukast (SINGULAIR) 10 MG tablet Take 1 tablet (10 mg total) by mouth at bedtime.  . [DISCONTINUED] amoxicillin-clavulanate (AUGMENTIN) 875-125 MG tablet Take 1 tablet by mouth every 12 (twelve) hours. (Patient not taking: Reported on 03/10/2017)  . [DISCONTINUED] predniSONE (DELTASONE) 20 MG tablet Take 1 tablet (20 mg total) by mouth 2 (two) times daily with a meal. (Patient not taking: Reported on 03/10/2017)   No facility-administered encounter medications on file as of 03/10/2017.     EXAM:  BP 102/64 (BP Location: Right Arm, Patient Position: Sitting, Cuff Size: Normal)   Pulse 80   Temp 97.6 F (36.4 C) (Oral)   Ht 5' 2.75" (1.594 m)   Wt 195 lb 14.4  oz (88.9 kg)   BMI 34.98 kg/m   Body mass index is 34.98 kg/m.  Physical Exam: Vital signs reviewed GMW:NUUV is a well-developed well-nourished alert cooperative   who appears stated age in no acute distress.   Quite congested a nd hard of hearing also HEENT: normocephalic atraumatic , Eyes: PERRL EOM's full,  conjunctiva clear, Nares: paten,t no deformity discharge or tenderness.but 2+ congested , Ears: no deformity EAC's clear TMs with normal landmarks. Mouth: clear OP, no lesions, edema.  Moist mucous membranes. Dentition in adequate repair. NECK: supple without masses, thyromegaly or bruits. CHEST/PULM:  Clear to auscultation and percussion breath sounds   Dec air move,ent but equal no wheeze , rales or rhonchi. No chest wall deformities or tenderness. CV: PMI is nondisplaced, S1 S2 no gallops, murmurs, rubs. Peripheral pulses are full without delay.No JVD . Breast: normal by inspection . No dimpling, discharge, masses, tenderness or discharge . ABDOMEN: Bowel sounds normal nontender  No guard or rebound, no hepato splenomegal no CVA tenderness.   Extremtities:  No clubbing cyanosis or edema, no acute joint swelling or redness no focal atrophy lft pnky with mallet finger  NEURO:  Oriented x3, cranial nerves 3-12 appear to be intact, no obvious focal weakness,gait within normal limits no abnormal reflexes or asymmetrical SKIN: No acute rashes normal turgor, color, no bruising or petechiae. Left cheek patch of scaly   Rash  Has makeup on  PSYCH: Oriented, good eye contact, no obvious depression anxiety, cognition and judgment appear normal. LN: no cervical axillary i adenopathy No noted deficits in memory, attention, and speech. Diabetic Foot Exam - Simple   Simple Foot Form Diabetic Foot exam was performed with the following findings:  Yes 03/10/2017 11:45 AM  Visual Inspection No deformities, no ulcerations, no other skin breakdown bilaterally:  Yes Sensation Testing Intact to touch and  monofilament testing bilaterally:  Yes Pulse Check Posterior Tibialis and Dorsalis pulse intact bilaterally:  Yes Comments      Lab Results  Component Value Date   WBC 8.2 02/13/2017   HGB 15.8 (H) 02/13/2017   HCT 47.5 (H) 02/13/2017   PLT 244.0 02/13/2017   GLUCOSE 120 (H) 02/13/2017   CHOL 158 04/17/2016   TRIG 113.0 04/17/2016   HDL 40.90 04/17/2016   LDLCALC 94 04/17/2016   ALT 11 04/17/2016   AST 12 04/17/2016   NA 139 02/13/2017   K 4.1 02/13/2017   CL 99 02/13/2017   CREATININE 0.85 02/13/2017   BUN 15 02/13/2017   CO2 33 (H) 02/13/2017   TSH 2.25 04/17/2016   HGBA1C 6.1 04/17/2016   MICROALBUR 3.8 (H) 04/14/2013  CLINICAL DATA:  Productive cough and shortness of breath for the past week. History of chronic bronchitis. Former smoker.  EXAM: CHEST  2 VIEW  COMPARISON:  06/04/2012  FINDINGS: Grossly unchanged cardiac silhouette and mediastinal contours with atherosclerotic plaque within the thoracic aorta. The lungs remain hyperexpanded with flattening the diaphragms and mild thinning of the biapical pulmonary parenchyma. Worsening diffuse though perihilar predominant slightly nodular interstitial thickening. Minimal bibasilar heterogeneous opacities, left greater than right, similar to the 05/2012 examination and favored to represent atelectasis or scar. No new focal airspace opacities. No pleural effusion or pneumothorax. No evidence of edema. No acute osseus abnormalities.  IMPRESSION: Findings most suggestive of airways disease/bronchitis, though note, atypical infection could have a similar appearance. A follow-up chest radiograph in 3 to 4 weeks after treatment is recommended to ensure resolution.   Electronically Signed   By: Sandi Mariscal M.D.   On: 02/14/2017 08:31   ASSESSMENT AND PLAN:  Discussed the following assessment and plan:  Visit for preventive health examination  Pre-diabetes - Plan: Lipid panel, Hemoglobin A1c, TSH,  CBC with Differential/Platelet, Hepatic function panel, Microalbumin / creatinine urine ratio  Elevated hemoglobin (HCC) - denies osa sx  consider cld as possible cause   - Plan: Lipid panel, Hemoglobin A1c, TSH, CBC with Differential/Platelet, Hepatic function panel, Pulmonary function test  Hyperlipidemia, unspecified hyperlipidemia type - Plan: Lipid panel, Hemoglobin A1c, TSH, CBC with Differential/Platelet, Hepatic function panel  Medication management - Plan: Lipid panel, Hemoglobin A1c, TSH, CBC with Differential/Platelet, Hepatic function panel  SOLITARY KIDNEY - Plan: Lipid panel, Hemoglobin A1c, TSH, CBC with Differential/Platelet, Hepatic function panel, Microalbumin / creatinine urine ratio  MYOCARDIAL INFARCTION, HX OF - Plan: Lipid panel, Hemoglobin A1c, TSH, CBC with Differential/Platelet, Hepatic function panel  Need for influenza vaccination - Plan: Flu vaccine HIGH DOSE PF (Fluzone High dose)  Need for hepatitis C screening test - Plan: Hepatitis C antibody  Routine general medical examination at a health care facility - Plan: Basic metabolic panel  Chronic sinusitis, unspecified location  Decreased hearing, bilateral  Family history of lung cancer - sister  Bronchitis - Plan: Pulmonary function test  History of tobacco use - Plan: Pulmonary function test  Abnormal chest x-ray - Plan: Pulmonary function test  Tobacco has 30 pack year hx but stopped in about 2002   fam hx  Colon and lung    Has  Chronic changes on chest x ray  Consider  pulm consult  Begin with pfts and fu  Patient Care Team: Burnis Medin, MD as PCP - General Paula Compton, MD as Attending Physician (Obstetrics and Gynecology) Irene Shipper, MD as Attending Physician (Gastroenterology) Sharyne Peach, MD as Consulting Physician (Ophthalmology) Minus Breeding, MD as Consulting Physician (Cardiology)  Patient Instructions  Will add a trial of Singulair to take once a day to see if it  decreases your congestion. It does help people who have chronic allergy in their nose and sinuses. You'll be contacted about getting pulmonary function tests as we discussed. You can see a dermatologist about the area on your face. Blood work today will let you know the results and plan follow-up depending on them.      Preventive Care 63 Years and Older, Female Preventive care refers to lifestyle choices and visits with your health care provider that can promote health and wellness. What does preventive care include?  A yearly physical exam. This is also called an annual well check.  Dental exams once or twice a year.  Routine eye exams. Ask your health care provider how often you should have your eyes checked.  Personal lifestyle choices, including: ? Daily care of your teeth and gums. ? Regular physical activity. ? Eating a healthy diet. ? Avoiding tobacco and drug use. ? Limiting alcohol use. ? Practicing safe sex. ? Taking low-dose aspirin every day. ? Taking vitamin and mineral supplements as recommended by your health care provider. What happens during an annual well check? The services and screenings done by your health care provider during your annual well check will depend on your age, overall health, lifestyle risk factors, and family history of disease. Counseling Your health care provider may ask you questions about your:  Alcohol use.  Tobacco use.  Drug use.  Emotional well-being.  Home and relationship well-being.  Sexual activity.  Eating habits.  History of falls.  Memory and ability to understand (cognition).  Work and work Statistician.  Reproductive health.  Screening You may have the following tests or measurements:  Height, weight, and BMI.  Blood pressure.  Lipid and cholesterol levels. These may be checked every 5 years, or more frequently if you are over 50 years old.  Skin  check.  Lung cancer screening. You may have this  screening every year starting at age 72 if you have a 30-pack-year history of smoking and currently smoke or have quit within the past 15 years.  Fecal occult blood test (FOBT) of the stool. You may have this test every year starting at age 44.  Flexible sigmoidoscopy or colonoscopy. You may have a sigmoidoscopy every 5 years or a colonoscopy every 10 years starting at age 27.  Hepatitis C blood test.  Hepatitis B blood test.  Sexually transmitted disease (STD) testing.  Diabetes screening. This is done by checking your blood sugar (glucose) after you have not eaten for a while (fasting). You may have this done every 1-3 years.  Bone density scan. This is done to screen for osteoporosis. You may have this done starting at age 37.  Mammogram. This may be done every 1-2 years. Talk to your health care provider about how often you should have regular mammograms.  Talk with your health care provider about your test results, treatment options, and if necessary, the need for more tests. Vaccines Your health care provider may recommend certain vaccines, such as:  Influenza vaccine. This is recommended every year.  Tetanus, diphtheria, and acellular pertussis (Tdap, Td) vaccine. You may need a Td booster every 10 years.  Varicella vaccine. You may need this if you have not been vaccinated.  Zoster vaccine. You may need this after age 86.  Measles, mumps, and rubella (MMR) vaccine. You may need at least one dose of MMR if you were born in 1957 or later. You may also need a second dose.  Pneumococcal 13-valent conjugate (PCV13) vaccine. One dose is recommended after age 57.  Pneumococcal polysaccharide (PPSV23) vaccine. One dose is recommended after age 79.  Meningococcal vaccine. You may need this if you have certain conditions.  Hepatitis A vaccine. You may need this if you have certain conditions or if you travel or work in places where you may be exposed to hepatitis A.  Hepatitis B  vaccine. You may need this if you have certain conditions or if you travel or work in places where you may be exposed to hepatitis B.  Haemophilus influenzae type b (Hib) vaccine. You may need this if you have certain conditions.  Talk to your health care provider about which screenings and vaccines you need and how often you need them. This information is not intended to replace advice given to you by your health care provider. Make sure you discuss any questions you have with your health care provider. Document Released: 06/09/2015 Document Revised: 01/31/2016 Document Reviewed: 03/14/2015 Elsevier Interactive Patient Education  2017 [place] [employer]. Brayln Duque M.D.

## 2017-03-10 ENCOUNTER — Ambulatory Visit (INDEPENDENT_AMBULATORY_CARE_PROVIDER_SITE_OTHER): Payer: Medicare Other | Admitting: Internal Medicine

## 2017-03-10 ENCOUNTER — Encounter: Payer: Self-pay | Admitting: Internal Medicine

## 2017-03-10 VITALS — BP 102/64 | HR 80 | Temp 97.6°F | Ht 62.75 in | Wt 195.9 lb

## 2017-03-10 DIAGNOSIS — H9193 Unspecified hearing loss, bilateral: Secondary | ICD-10-CM

## 2017-03-10 DIAGNOSIS — J40 Bronchitis, not specified as acute or chronic: Secondary | ICD-10-CM | POA: Diagnosis not present

## 2017-03-10 DIAGNOSIS — Z801 Family history of malignant neoplasm of trachea, bronchus and lung: Secondary | ICD-10-CM | POA: Diagnosis not present

## 2017-03-10 DIAGNOSIS — D582 Other hemoglobinopathies: Secondary | ICD-10-CM

## 2017-03-10 DIAGNOSIS — E785 Hyperlipidemia, unspecified: Secondary | ICD-10-CM | POA: Diagnosis not present

## 2017-03-10 DIAGNOSIS — Z1159 Encounter for screening for other viral diseases: Secondary | ICD-10-CM | POA: Diagnosis not present

## 2017-03-10 DIAGNOSIS — R7303 Prediabetes: Secondary | ICD-10-CM

## 2017-03-10 DIAGNOSIS — J329 Chronic sinusitis, unspecified: Secondary | ICD-10-CM | POA: Diagnosis not present

## 2017-03-10 DIAGNOSIS — R9389 Abnormal findings on diagnostic imaging of other specified body structures: Secondary | ICD-10-CM

## 2017-03-10 DIAGNOSIS — Z23 Encounter for immunization: Secondary | ICD-10-CM | POA: Diagnosis not present

## 2017-03-10 DIAGNOSIS — Z87891 Personal history of nicotine dependence: Secondary | ICD-10-CM

## 2017-03-10 DIAGNOSIS — Q602 Renal agenesis, unspecified: Secondary | ICD-10-CM

## 2017-03-10 DIAGNOSIS — Z0000 Encounter for general adult medical examination without abnormal findings: Secondary | ICD-10-CM | POA: Diagnosis not present

## 2017-03-10 DIAGNOSIS — I252 Old myocardial infarction: Secondary | ICD-10-CM | POA: Diagnosis not present

## 2017-03-10 DIAGNOSIS — Q605 Renal hypoplasia, unspecified: Secondary | ICD-10-CM

## 2017-03-10 DIAGNOSIS — Z79899 Other long term (current) drug therapy: Secondary | ICD-10-CM

## 2017-03-10 LAB — BASIC METABOLIC PANEL
BUN: 14 mg/dL (ref 6–23)
CALCIUM: 9.5 mg/dL (ref 8.4–10.5)
CO2: 31 mEq/L (ref 19–32)
CREATININE: 0.68 mg/dL (ref 0.40–1.20)
Chloride: 102 mEq/L (ref 96–112)
GFR: 90.8 mL/min (ref >60.00)
Glucose, Bld: 94 mg/dL (ref 70–99)
Potassium: 4.3 mEq/L (ref 3.5–5.1)
Sodium: 141 mEq/L (ref 135–145)

## 2017-03-10 LAB — CBC WITH DIFFERENTIAL/PLATELET
BASOS PCT: 0.6 % (ref 0.0–3.0)
Basophils Absolute: 0 10*3/uL (ref 0.0–0.1)
EOS PCT: 1.9 % (ref 0.0–5.0)
Eosinophils Absolute: 0.1 10*3/uL (ref 0.0–0.7)
HCT: 48.8 % — ABNORMAL HIGH (ref 36.0–46.0)
Hemoglobin: 16.1 g/dL — ABNORMAL HIGH (ref 12.0–15.0)
LYMPHS ABS: 2.1 10*3/uL (ref 0.7–4.0)
Lymphocytes Relative: 32 % (ref 12.0–46.0)
MCHC: 32.9 g/dL (ref 30.0–36.0)
MCV: 90.7 fl (ref 78.0–100.0)
MONO ABS: 0.6 10*3/uL (ref 0.1–1.0)
Monocytes Relative: 9.8 % (ref 3.0–12.0)
NEUTROS PCT: 55.7 % (ref 43.0–77.0)
Neutro Abs: 3.6 10*3/uL (ref 1.4–7.7)
PLATELETS: 249 10*3/uL (ref 150.0–400.0)
RBC: 5.38 Mil/uL — ABNORMAL HIGH (ref 3.87–5.11)
RDW: 13.9 % (ref 11.5–15.5)
WBC: 6.5 10*3/uL (ref 4.0–10.5)

## 2017-03-10 LAB — LIPID PANEL
CHOL/HDL RATIO: 4
Cholesterol: 158 mg/dL (ref 0–200)
HDL: 41.3 mg/dL (ref >39.00)
LDL Cholesterol: 85 mg/dL (ref 0–99)
NonHDL: 116.68
TRIGLYCERIDES: 157 mg/dL — AB (ref 0.0–149.0)
VLDL: 31.4 mg/dL (ref 0.0–40.0)

## 2017-03-10 LAB — MICROALBUMIN / CREATININE URINE RATIO
CREATININE, U: 54.8 mg/dL
MICROALB/CREAT RATIO: 2 mg/g (ref 0.0–30.0)
Microalb, Ur: 1.1 mg/dL (ref 0.0–1.9)

## 2017-03-10 LAB — HEMOGLOBIN A1C: Hgb A1c MFr Bld: 6.4 % (ref 4.6–6.5)

## 2017-03-10 LAB — HEPATIC FUNCTION PANEL
ALT: 10 U/L (ref 0–35)
AST: 11 U/L (ref 0–37)
Albumin: 3.9 g/dL (ref 3.5–5.2)
Alkaline Phosphatase: 67 U/L (ref 39–117)
BILIRUBIN TOTAL: 0.6 mg/dL (ref 0.2–1.2)
Bilirubin, Direct: 0.1 mg/dL (ref 0.0–0.3)
Total Protein: 6.2 g/dL (ref 6.0–8.3)

## 2017-03-10 LAB — TSH: TSH: 1.51 u[IU]/mL (ref 0.35–4.50)

## 2017-03-10 MED ORDER — MONTELUKAST SODIUM 10 MG PO TABS: 10.0000 mg | ORAL_TABLET | Freq: Every day | ORAL | 3 refills | Status: DC

## 2017-03-10 NOTE — Patient Instructions (Addendum)
Will add a trial of Singulair to take once a day to see if it decreases your congestion. It does help people who have chronic allergy in their nose and sinuses. You'll be contacted about getting pulmonary function tests as we discussed. You can see a dermatologist about the area on your face. Blood work today will let you know the results and plan follow-up depending on them.      Preventive Care 70 Years and Older, Female Preventive care refers to lifestyle choices and visits with your health care provider that can promote health and wellness. What does preventive care include?  A yearly physical exam. This is also called an annual well check.  Dental exams once or twice a year.  Routine eye exams. Ask your health care provider how often you should have your eyes checked.  Personal lifestyle choices, including: ? Daily care of your teeth and gums. ? Regular physical activity. ? Eating a healthy diet. ? Avoiding tobacco and drug use. ? Limiting alcohol use. ? Practicing safe sex. ? Taking low-dose aspirin every day. ? Taking vitamin and mineral supplements as recommended by your health care provider. What happens during an annual well check? The services and screenings done by your health care provider during your annual well check will depend on your age, overall health, lifestyle risk factors, and family history of disease. Counseling Your health care provider may ask you questions about your:  Alcohol use.  Tobacco use.  Drug use.  Emotional well-being.  Home and relationship well-being.  Sexual activity.  Eating habits.  History of falls.  Memory and ability to understand (cognition).  Work and work Statistician.  Reproductive health.  Screening You may have the following tests or measurements:  Height, weight, and BMI.  Blood pressure.  Lipid and cholesterol levels. These may be checked every 5 years, or more frequently if you are over 70 years  old.  Skin check.  Lung cancer screening. You may have this screening every year starting at age 70 if you have a 30-pack-year history of smoking and currently smoke or have quit within the past 15 years.  Fecal occult blood test (FOBT) of the stool. You may have this test every year starting at age 70.  Flexible sigmoidoscopy or colonoscopy. You may have a sigmoidoscopy every 5 years or a colonoscopy every 10 years starting at age 70.  Hepatitis C blood test.  Hepatitis B blood test.  Sexually transmitted disease (STD) testing.  Diabetes screening. This is done by checking your blood sugar (glucose) after you have not eaten for a while (fasting). You may have this done every 1-3 years.  Bone density scan. This is done to screen for osteoporosis. You may have this done starting at age 70.  Mammogram. This may be done every 1-2 years. Talk to your health care provider about how often you should have regular mammograms.  Talk with your health care provider about your test results, treatment options, and if necessary, the need for more tests. Vaccines Your health care provider may recommend certain vaccines, such as:  Influenza vaccine. This is recommended every year.  Tetanus, diphtheria, and acellular pertussis (Tdap, Td) vaccine. You may need a Td booster every 10 years.  Varicella vaccine. You may need this if you have not been vaccinated.  Zoster vaccine. You may need this after age 70.  Measles, mumps, and rubella (MMR) vaccine. You may need at least one dose of MMR if you were born in 1957 or  later. You may also need a second dose.  Pneumococcal 13-valent conjugate (PCV13) vaccine. One dose is recommended after age 70.  Pneumococcal polysaccharide (PPSV23) vaccine. One dose is recommended after age 70.  Meningococcal vaccine. You may need this if you have certain conditions.  Hepatitis A vaccine. You may need this if you have certain conditions or if you travel or work  in places where you may be exposed to hepatitis A.  Hepatitis B vaccine. You may need this if you have certain conditions or if you travel or work in places where you may be exposed to hepatitis B.  Haemophilus influenzae type b (Hib) vaccine. You may need this if you have certain conditions.  Talk to your health care provider about which screenings and vaccines you need and how often you need them. This information is not intended to replace advice given to you by your health care provider. Make sure you discuss any questions you have with your health care provider. Document Released: 06/09/2015 Document Revised: 01/31/2016 Document Reviewed: 03/14/2015 Elsevier Interactive Patient Education  2017 [employer].

## 2017-03-11 ENCOUNTER — Ambulatory Visit (INDEPENDENT_AMBULATORY_CARE_PROVIDER_SITE_OTHER)
Admission: RE | Admit: 2017-03-11 | Discharge: 2017-03-11 | Disposition: A | Discharge status: Home / Self Care | Payer: Medicare Other | Source: Ambulatory Visit | Attending: Adult Health | Admitting: Adult Health

## 2017-03-11 DIAGNOSIS — R0602 Shortness of breath: Secondary | ICD-10-CM | POA: Diagnosis not present

## 2017-03-11 DIAGNOSIS — J189 Pneumonia, unspecified organism: Secondary | ICD-10-CM

## 2017-03-11 DIAGNOSIS — R05 Cough: Secondary | ICD-10-CM | POA: Diagnosis not present

## 2017-03-11 LAB — HEPATITIS C ANTIBODY
Hepatitis C Ab: NONREACTIVE
SIGNAL TO CUT-OFF: 0.01 (ref <1.00)

## 2017-03-12 ENCOUNTER — Encounter: Payer: Self-pay | Admitting: Adult Health

## 2017-03-12 ENCOUNTER — Other Ambulatory Visit: Payer: Self-pay | Admitting: Family Medicine

## 2017-03-12 DIAGNOSIS — R933 Abnormal findings on diagnostic imaging of other parts of digestive tract: Secondary | ICD-10-CM

## 2017-03-13 ENCOUNTER — Encounter: Payer: Self-pay | Admitting: Adult Health

## 2017-03-14 ENCOUNTER — Other Ambulatory Visit: Payer: Self-pay

## 2017-03-14 NOTE — Patient Outreach (Signed)
[facility]) Care Management  03/14/2017  Rebecca Mcintyre [birth year] [id number]    Medication Adherence call to Mrs. Rebecca Mcintyre the reason for this call is because Mrs. Rebecca Mcintyre is showing past due under [facility] Ins.on Metformin 500 mg spoke to patient she said she still has medication she wont need any until next month.   [place] Management Direct Dial [phone]  Fax [phone] [email or web address]

## 2017-03-17 ENCOUNTER — Ambulatory Visit (INDEPENDENT_AMBULATORY_CARE_PROVIDER_SITE_OTHER)
Admission: RE | Admit: 2017-03-17 | Discharge: 2017-03-17 | Disposition: A | Discharge status: Home / Self Care | Payer: Medicare Other | Source: Ambulatory Visit | Attending: Adult Health | Admitting: Adult Health

## 2017-03-17 DIAGNOSIS — R933 Abnormal findings on diagnostic imaging of other parts of digestive tract: Secondary | ICD-10-CM | POA: Diagnosis not present

## 2017-03-17 DIAGNOSIS — R05 Cough: Secondary | ICD-10-CM

## 2017-03-17 DIAGNOSIS — J9811 Atelectasis: Secondary | ICD-10-CM | POA: Diagnosis not present

## 2017-03-17 MED ORDER — IOPAMIDOL (ISOVUE-300) INJECTION 61%
80.0000 mL | Freq: Once | INTRAVENOUS | Status: AC | PRN
  Administered 2017-03-17: 80 mL via INTRAVENOUS

## 2017-03-19 ENCOUNTER — Telehealth: Payer: Self-pay | Admitting: Internal Medicine

## 2017-03-19 NOTE — Telephone Encounter (Signed)
See result notes 03/10/17 in labs Notes recorded by Virl Cagey, CMA on 03/19/2017 at 12:29 PM EDT Results reviewed with patient, expressed understanding. Called Pulmonary and verified that the order was received and she is going to be contacted today to schedule this. Will send to CN and WP as FYI. Nothing further needed.   Notes recorded by Burnis Medin, MD on 03/17/2017 at 6:29 PM EDT Chest CT ordered By CN shows emphysema. This may be the cause of some of her chest symptoms. I put in an order for pulmonary function tests please make sure these have gone through and will be ordered.  Nothing further needed.

## 2017-03-19 NOTE — Telephone Encounter (Signed)
Pt would like chest xray result

## 2017-04-09 ENCOUNTER — Encounter: Payer: Self-pay | Admitting: Internal Medicine

## 2017-04-11 ENCOUNTER — Ambulatory Visit: Payer: Medicare Other | Admitting: Adult Health

## 2017-04-11 ENCOUNTER — Other Ambulatory Visit: Payer: Self-pay | Admitting: Internal Medicine

## 2017-04-11 MED ORDER — METOPROLOL TARTRATE 25 MG PO TABS
12.5000 mg | ORAL_TABLET | Freq: Two times a day (BID) | ORAL | 1 refills | Status: DC
Start: 2017-04-11 — End: 2018-01-07

## 2017-04-11 MED ORDER — SIMVASTATIN 40 MG PO TABS: 40.0000 mg | ORAL_TABLET | Freq: Every day | ORAL | 1 refills | Status: DC

## 2017-04-11 MED ORDER — METFORMIN HCL 500 MG PO TABS: 500.0000 mg | ORAL_TABLET | Freq: Two times a day (BID) | ORAL | 1 refills | Status: DC

## 2017-04-11 MED ORDER — MONTELUKAST SODIUM 10 MG PO TABS: 10.0000 mg | ORAL_TABLET | Freq: Every day | ORAL | 6 refills | Status: DC

## 2017-04-12 DIAGNOSIS — J019 Acute sinusitis, unspecified: Secondary | ICD-10-CM | POA: Diagnosis not present

## 2017-05-02 DIAGNOSIS — E119 Type 2 diabetes mellitus without complications: Secondary | ICD-10-CM | POA: Diagnosis not present

## 2017-05-02 LAB — HM DIABETES EYE EXAM

## 2017-05-21 ENCOUNTER — Encounter: Payer: Self-pay | Admitting: Internal Medicine

## 2017-07-18 DIAGNOSIS — J329 Chronic sinusitis, unspecified: Secondary | ICD-10-CM | POA: Diagnosis not present

## 2017-07-18 DIAGNOSIS — R6883 Chills (without fever): Secondary | ICD-10-CM | POA: Diagnosis not present

## 2017-07-18 DIAGNOSIS — R0982 Postnasal drip: Secondary | ICD-10-CM | POA: Diagnosis not present

## 2017-07-18 DIAGNOSIS — R0981 Nasal congestion: Secondary | ICD-10-CM | POA: Diagnosis not present

## 2017-08-12 DIAGNOSIS — M25561 Pain in right knee: Secondary | ICD-10-CM | POA: Diagnosis not present

## 2017-08-12 DIAGNOSIS — T148XXA Other injury of unspecified body region, initial encounter: Secondary | ICD-10-CM | POA: Diagnosis not present

## 2017-08-26 DIAGNOSIS — M25561 Pain in right knee: Secondary | ICD-10-CM | POA: Diagnosis not present

## 2017-10-05 ENCOUNTER — Other Ambulatory Visit: Payer: Self-pay | Admitting: Internal Medicine

## 2017-11-14 DIAGNOSIS — Z1231 Encounter for screening mammogram for malignant neoplasm of breast: Secondary | ICD-10-CM | POA: Diagnosis not present

## 2017-11-14 DIAGNOSIS — Z803 Family history of malignant neoplasm of breast: Secondary | ICD-10-CM | POA: Diagnosis not present

## 2017-11-14 LAB — HM MAMMOGRAPHY

## 2017-11-20 ENCOUNTER — Encounter: Payer: Self-pay | Admitting: Internal Medicine

## 2017-12-22 ENCOUNTER — Ambulatory Visit (INDEPENDENT_AMBULATORY_CARE_PROVIDER_SITE_OTHER): Payer: Medicare Other | Admitting: Internal Medicine

## 2017-12-22 ENCOUNTER — Encounter: Payer: Self-pay | Admitting: Internal Medicine

## 2017-12-22 VITALS — BP 122/64 | HR 87 | Temp 98.1°F | Wt 200.2 lb

## 2017-12-22 DIAGNOSIS — D582 Other hemoglobinopathies: Secondary | ICD-10-CM

## 2017-12-22 DIAGNOSIS — I10 Essential (primary) hypertension: Secondary | ICD-10-CM | POA: Diagnosis not present

## 2017-12-22 DIAGNOSIS — R7303 Prediabetes: Secondary | ICD-10-CM

## 2017-12-22 DIAGNOSIS — E785 Hyperlipidemia, unspecified: Secondary | ICD-10-CM | POA: Diagnosis not present

## 2017-12-22 DIAGNOSIS — M109 Gout, unspecified: Secondary | ICD-10-CM | POA: Diagnosis not present

## 2017-12-22 MED ORDER — PREDNISONE 20 MG PO TABS: 20.0000 mg | ORAL_TABLET | Freq: Two times a day (BID) | ORAL | 0 refills | Status: DC

## 2017-12-22 NOTE — Patient Instructions (Addendum)
This is most likely gout .    Since not responding to  Ibuprofen   Treat with prednisone tp decrease the pain and inflammation.   Get your CPX appt for the fall and  Call ahead to get lab appt pre visit   I will put  In orders    For this .   Will check uric acid level  At that time    Gout Gout is painful swelling that can occur in some of your joints. Gout is a type of arthritis. This condition is caused by having too much uric acid in your body. Uric acid is a chemical that forms when your body breaks down substances called purines. Purines are important for building body proteins. When your body has too much uric acid, sharp crystals can form and build up inside your joints. This causes pain and swelling. Gout attacks can happen quickly and be very painful (acute gout). Over time, the attacks can affect more joints and become more frequent (chronic gout). Gout can also cause uric acid to build up under your skin and inside your kidneys. What are the causes? This condition is caused by too much uric acid in your blood. This can occur because:  Your kidneys do not remove enough uric acid from your blood. This is the most common cause.  Your body makes too much uric acid. This can occur with some cancers and cancer treatments. It can also occur if your body is breaking down too many red blood cells (hemolytic anemia).  You eat too many foods that are high in purines. These foods include organ meats and some seafood. Alcohol, especially beer, is also high in purines.  A gout attack may be triggered by trauma or stress. What increases the risk? This condition is more likely to develop in people who:  Have a family history of gout.  Are female and middle-aged.  Are female and have gone through menopause.  Are obese.  Frequently drink alcohol, especially beer.  Are dehydrated.  Lose weight too quickly.  Have an organ transplant.  Have lead poisoning.  Take certain medicines,  including aspirin, cyclosporine, diuretics, levodopa, and niacin.  Have kidney disease or psoriasis.  What are the signs or symptoms? An attack of acute gout happens quickly. It usually occurs in just one joint. The most common place is the big toe. Attacks often start at night. Other joints that may be affected include joints of the feet, ankle, knee, fingers, wrist, or elbow. Symptoms may include:  Severe pain.  Warmth.  Swelling.  Stiffness.  Tenderness. The affected joint may be very painful to touch.  Shiny, red, or purple skin.  Chills and fever.  Chronic gout may cause symptoms more frequently. More joints may be involved. You may also have white or yellow lumps (tophi) on your hands or feet or in other areas near your joints. How is this diagnosed? This condition is diagnosed based on your symptoms, medical history, and physical exam. You may have tests, such as:  Blood tests to measure uric acid levels.  Removal of joint fluid with a needle (aspiration) to look for uric acid crystals.  X-rays to look for joint damage.  How is this treated? Treatment for this condition has two phases: treating an acute attack and preventing future attacks. Acute gout treatment may include medicines to reduce pain and swelling, including:  NSAIDs.  Steroids. These are strong anti-inflammatory medicines that can be taken by mouth (orally) or injected  into a joint.  Colchicine. This medicine relieves pain and swelling when it is taken soon after an attack. It can be given orally or through an IV tube.  Preventive treatment may include:  Daily use of smaller doses of NSAIDs or colchicine.  Use of a medicine that reduces uric acid levels in your blood.  Changes to your diet. You may need to see a specialist about healthy eating (dietitian).  Follow these instructions at home: During a Gout Attack  If directed, apply ice to the affected area: ? Put ice in a plastic bag. ? Place  a towel between your skin and the bag. ? Leave the ice on for 20 minutes, 2-3 times a day.  Rest the joint as much as possible. If the affected joint is in your leg, you may be given crutches to use.  Raise (elevate) the affected joint above the level of your heart as often as possible.  Drink enough fluids to keep your urine clear or pale yellow.  Take over-the-counter and prescription medicines only as told by your health care provider.  Do not drive or operate heavy machinery while taking prescription pain medicine.  Follow instructions from your health care provider about eating or drinking restrictions.  Return to your normal activities as told by your health care provider. Ask your health care provider what activities are safe for you. Avoiding Future Gout Attacks  Follow a low-purine diet as told by your dietitian or health care provider. Avoid foods and drinks that are high in purines, including liver, kidney, anchovies, asparagus, herring, mushrooms, mussels, and beer.  Limit alcohol intake to no more than 1 drink a day for nonpregnant women and 2 drinks a day for men. One drink equals 12 oz of beer, 5 oz of wine, or 1 oz of hard liquor.  Maintain a healthy weight or lose weight if you are overweight. If you want to lose weight, talk with your health care provider. It is important that you do not lose weight too quickly.  Start or maintain an exercise program as told by your health care provider.  Drink enough fluids to keep your urine clear or pale yellow.  Take over-the-counter and prescription medicines only as told by your health care provider.  Keep all follow-up visits as told by your health care provider. This is important. Contact a health care provider if:  You have another gout attack.  You continue to have symptoms of a gout attack after10 days of treatment.  You have side effects from your medicines.  You have chills or a fever.  You have burning pain  when you urinate.  You have pain in your lower back or belly. Get help right away if:  You have severe or uncontrolled pain.  You cannot urinate. This information is not intended to replace advice given to you by your health care provider. Make sure you discuss any questions you have with your health care provider. Document Released: 05/10/2000 Document Revised: 10/19/2015 Document Reviewed: 02/23/2015 Elsevier Interactive Patient Education  [employer].

## 2017-12-22 NOTE — Progress Notes (Signed)
Chief Complaint  Patient presents with  . Gout    left big toe x 1.5 weeks, worse over last 2-3 days. Redness and swelling. Pt tried soaking in [employer] water and taking OTC pain relief - not helping this episode.     HPI: Rebecca Mcintyre 72 y.o. come in for left breat toe mtp area pain and swelling  Without trauma  ... Began  Middle of night  And usually soaks with epsons salt and was getting better sx off an on for weeks    Getting beter  and then awaoke  3 days agow paina nd redness and now worse  Sheet on foot hurts  Took buprpofen and tylenol and now feels like a tootha che and not better a tooth   Hx of  Similar 10 year ago frequency  At most  once a year   Or less   Little etoh. p Onset  10 years ago    Only ocass .  Flare  this is more than usual  No fever injury   Twin sis passes  This year  Suddenly and unexpected  ROS: See pertinent positives and negatives per HPI.  Past Medical History:  Diagnosis Date  . Allergy   . ASCUS on Pap smear    had repeat x 2 normal with [name] Plan 2011  . Gout   . Hyperlipidemia   . Hypertension   . Myocardial infarction [facility])    2002 95% proximal LAD stenosis with thrombus followed by 90% stenosis, the first diagonal 80% stenosis, circumflex 30% stenosis, circumflex obtuse marginal subbranch 70% stenosis, PDA 40% stenosis. She had a drug-eluting stent placement with a Pixel stent  . Osteopenia   . Solitary kidney     Family History  Problem Relation Age of Onset  . Breast cancer Mother 28  . Lung cancer Father   . Heart attack Brother        Died age 56 MI  . Colon cancer Brother   . Heart disease Daughter 40       smoker and overweight   . Esophageal cancer Neg Hx   . Pancreatic cancer Neg Hx   . Rectal cancer Neg Hx   . Stomach cancer Neg Hx     Social History   Socioeconomic History  . Marital status: Married    Spouse name: Not on file  . Number of children: 2  . Years of education: Not on file  . Highest  education level: Not on file  Occupational History    Employer: [employer]  . Financial resource strain: Not on file  . Food insecurity:    Worry: Not on file    Inability: Not on file  . Transportation needs:    Medical: Not on file    Non-medical: Not on file  Tobacco Use  . Smoking status: Former Smoker    Types: Cigarettes  . Smokeless tobacco: Never Used  . Tobacco comment: 37 years  Substance and Sexual Activity  . Alcohol use: Yes    Comment: 1-2 per month  . Drug use: No  . Sexual activity: Not on file  Lifestyle  . Physical activity:    Days per week: Not on file    Minutes per session: Not on file  . Stress: Not on file  Relationships  . Social connections:    Talks on phone: Not on file    Gets together: Not on file    Attends  religious service: Not on file    Active member of club or organization: Not on file    Attends meetings of clubs or organizations: Not on file    Relationship status: Not on file  Other Topics Concern  . 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 aspirin 81 MG tablet Take 81 mg by mouth daily.    . metFORMIN (GLUCOPHAGE) 500 MG tablet Take 1 tablet (500 mg total) by mouth 2 (two) times daily with a meal. NEED OFFICE VISIT FOR FURTHER REFILLS! 180 tablet 0  . metoprolol tartrate (LOPRESSOR) 25 MG tablet Take 0.5 tablets (12.5 mg total) 2 (two) times daily by mouth. 90 tablet 1  . montelukast (SINGULAIR) 10 MG tablet Take 1 tablet (10 mg total) at bedtime by mouth. 30 tablet 6  . MULTIPLE VITAMIN PO Take 1 tablet by mouth daily.     . nitroGLYCERIN (NITROSTAT) 0.4 MG SL tablet Place 1 tablet (0.4 mg total) under the tongue every 5 (five) minutes as needed. 30 tablet 0  . simvastatin (ZOCOR) 40 MG tablet  Take 1 tablet (40 mg total) at bedtime by mouth. 90 tablet 1  . albuterol (PROVENTIL HFA;VENTOLIN HFA) 108 (90 Base) MCG/ACT inhaler Inhale 2 puffs into the lungs every 6 (six) hours as needed for wheezing or shortness of breath. (Patient not taking: Reported on 12/22/2017) 1 Inhaler 2   No facility-administered medications prior to visit.      EXAM:  BP 122/64 (BP Location: Right Arm, Patient Position: Sitting, Cuff Size: Normal)   Pulse 87   Temp 98.1 F (36.7 C) (Oral)   Wt 200 lb 3.2 oz (90.8 kg)   BMI 35.75 kg/m   Body mass index is 35.75 kg/m.  GENERAL: vitals reviewed and listed above, alert, oriented, appears well hydrated and in no acute distress HEENT: atraumatic, conjunctiva  clear, no obvious abnormalities on inspection of external nose and ears MS: moves all extremities left great mtp area and medial foot with redness warmth and tenderness no  Ulceration break in skin   NV seems nl PSYCH: pleasant and cooperative, no obvious depression or anxiety Lab Results  Component Value Date   WBC 6.5 03/10/2017   HGB 16.1 (H) 03/10/2017   HCT 48.8 (H) 03/10/2017   PLT 249.0 03/10/2017   GLUCOSE 94 03/10/2017   CHOL 158 03/10/2017   TRIG 157.0 (H) 03/10/2017   HDL 41.30 03/10/2017   LDLCALC 85 03/10/2017   ALT 10 03/10/2017   AST 11 03/10/2017   NA 141 03/10/2017   K 4.3 03/10/2017   CL 102 03/10/2017   CREATININE 0.68 03/10/2017   BUN 14 03/10/2017   CO2 31 03/10/2017   TSH 1.51 03/10/2017   HGBA1C 6.4 03/10/2017   MICROALBUR 1.1 03/10/2017   BP Readings from Last 3 Encounters:  12/22/17 122/64  03/10/17 102/64  02/28/17 122/70    ASSESSMENT AND PLAN:  Discussed the following assessment and plan:  Acute gout involving toe of left foot, unspecified cause - most likely dx  clinical  remote hx of x ray 2010 mild djd  - Plan: Basic metabolic panel, CBC with Differential/Platelet, Hemoglobin A1c, Hepatic function panel, Lipid panel, TSH, Uric  Acid  Essential  hypertension - Plan: Basic metabolic panel, CBC with Differential/Platelet, Hemoglobin A1c, Hepatic function panel, Lipid panel, TSH, Uric Acid  Pre-diabetes - Plan: Basic metabolic panel, CBC with Differential/Platelet, Hemoglobin A1c, Hepatic function panel, Lipid panel, TSH, Uric Acid  Hyperlipidemia, unspecified hyperlipidemia type - Plan: Basic metabolic panel, CBC with Differential/Platelet, Hemoglobin A1c, Hepatic function panel, Lipid panel, TSH, Uric Acid  Elevated hemoglobin (HCC) - Plan: Basic metabolic panel, CBC with Differential/Platelet, Hemoglobin A1c, Hepatic function panel, Lipid panel, TSH, Uric Acid   Expectant management. And plan  uric acid check t next labs in the fall for cpx  Failed nsaid so will add prednisone pt aware of elevated bg risk     Condolences on loss of twin sis  Plan cpx and  Labs UA included for the fall .   -Patient advised to return or notify health care team  if  new concerns arise.  Patient Instructions  This is most likely gout .    Since not responding to  Ibuprofen   Treat with prednisone tp decrease the pain and inflammation.   Get your CPX appt for the fall and  Call ahead to get lab appt pre visit   I will put  In orders    For this .   Will check uric acid level  At that time    Gout Gout is painful swelling that can occur in some of your joints. Gout is a type of arthritis. This condition is caused by having too much uric acid in your body. Uric acid is a chemical that forms when your body breaks down substances called purines. Purines are important for building body proteins. When your body has too much uric acid, sharp crystals can form and build up inside your joints. This causes pain and swelling. Gout attacks can happen quickly and be very painful (acute gout). Over time, the attacks can affect more joints and become more frequent (chronic gout). Gout can also cause uric acid to build up under your skin and inside your  kidneys. What are the causes? This condition is caused by too much uric acid in your blood. This can occur because:  Your kidneys do not remove enough uric acid from your blood. This is the most common cause.  Your body makes too much uric acid. This can occur with some cancers and cancer treatments. It can also occur if your body is breaking down too many red blood cells (hemolytic anemia).  You eat too many foods that are high in purines. These foods include organ meats and some seafood. Alcohol, especially beer, is also high in purines.  A gout attack may be triggered by trauma or stress. What increases the risk? This condition is more likely to develop in people who:  Have a family history of gout.  Are female and middle-aged.  Are female and have gone through menopause.  Are obese.  Frequently drink alcohol, especially beer.  Are dehydrated.  Lose weight too quickly.  Have an organ transplant.  Have lead poisoning.  Take certain medicines, including aspirin, cyclosporine, diuretics, levodopa, and niacin.  Have kidney disease or psoriasis.  What are the signs or symptoms? An attack of acute gout happens quickly. It usually occurs in just one joint. The most common place is the big toe. Attacks often start at night. Other joints that may be affected include joints of the feet, ankle, knee, fingers, wrist, or elbow. Symptoms may include:  Severe pain.  Warmth.  Swelling.  Stiffness.  Tenderness. The affected joint may be very painful to touch.  Shiny, red, or purple skin.  Chills and fever.  Chronic gout may cause symptoms more frequently. More joints may be involved. You may also have white or yellow lumps (tophi) on your hands or feet or in other areas near your joints. How is this diagnosed? This condition is diagnosed based on your symptoms, medical history, and physical exam. You may have tests, such as:  Blood tests to measure uric acid  levels.  Removal of joint fluid with a needle (aspiration) to look for uric acid crystals.  X-rays to look for joint damage.  How is this treated? Treatment for this condition has two phases: treating an acute attack and preventing future attacks. Acute gout treatment may include medicines to reduce pain and swelling, including:  NSAIDs.  Steroids. These are strong anti-inflammatory medicines that can be taken by mouth (orally) or injected into a joint.  Colchicine. This medicine relieves pain and swelling when it is taken soon after an attack. It can be given orally or through an IV tube.  Preventive treatment may include:  Daily use of smaller doses of NSAIDs or colchicine.  Use of a medicine that reduces uric acid levels in your blood.  Changes to your diet. You may need to see a specialist about healthy eating (dietitian).  Follow these instructions at home: During a Gout Attack  If directed, apply ice to the affected area: ? Put ice in a plastic bag. ? Place a towel between your skin and the bag. ? Leave the ice on for 20 minutes, 2-3 times a day.  Rest the joint as much as possible. If the affected joint is in your leg, you may be given crutches to use.  Raise (elevate) the affected joint above the level of your heart as often as possible.  Drink enough fluids to keep your urine clear or pale yellow.  Take over-the-counter and prescription medicines only as told by your health care provider.  Do not drive or operate heavy machinery while taking prescription pain medicine.  Follow instructions from your health care provider about eating or drinking restrictions.  Return to your normal activities as told by your health care provider. Ask your health care provider what activities are safe for you. Avoiding Future Gout Attacks  Follow a low-purine diet as told by your dietitian or health care provider. Avoid foods and drinks that are high in purines, including liver,  kidney, anchovies, asparagus, herring, mushrooms, mussels, and beer.  Limit alcohol intake to no more than 1 drink a day for nonpregnant women and 2 drinks a day for men. One drink equals 12 oz of beer, 5 oz of wine, or 1 oz of hard liquor.  Maintain a healthy weight or lose weight if you are overweight. If you want to lose weight, talk with your health care provider. It is important that you do not lose weight too quickly.  Start or maintain an exercise program as told by your health care provider.  Drink enough fluids to keep your urine clear or pale yellow.  Take over-the-counter and prescription medicines only as told by your health care provider.  Keep all follow-up visits as told by your health care provider. This is important. Contact a health care provider if:  You have another gout attack.  You continue to have symptoms of a gout attack after10 days of treatment.  You have side effects from your medicines.  You have chills or a fever.  You have burning pain when you urinate.  You have pain in your lower back or belly. Get help right away if:  You have severe or uncontrolled pain.  You cannot urinate. This information is not intended to replace advice given to you by your health care provider. Make sure you discuss any questions you have with your health care provider. Document Released: 05/10/2000 Document Revised: 10/19/2015 Document Reviewed: 02/23/2015 Elsevier Interactive Patient Education  2018 [place] [employer]. Suan Pyeatt M.D.

## 2017-12-29 ENCOUNTER — Telehealth: Payer: Self-pay | Admitting: Family Medicine

## 2017-12-29 ENCOUNTER — Encounter: Payer: Self-pay | Admitting: Internal Medicine

## 2017-12-29 DIAGNOSIS — M109 Gout, unspecified: Secondary | ICD-10-CM

## 2017-12-29 NOTE — Addendum Note (Signed)
Addended by: Virl Cagey on: 12/29/2017 04:40 PM   Modules accepted: Orders

## 2017-12-29 NOTE — Telephone Encounter (Addendum)
See Mychart message for 12/29/17. Apologized for the delay in getting back with her.   Per Dr Regis Bill, need more clinical information.  Can see her in office, do an xray and then refer if needed OR do a referral to Sports Medicine.  ---- Hulen Skains and spoke with patient, states that the Prednisone worked in the beginning for the first 2 days then symptoms returned. Pt still having pain, redness and swelling in her big toe.   Pt wants to see Dr Regis Bill, get xray and maybe try bigger dose of Prednisone and then referral to Sports Medicine if needed.  Appt scheduled for 12/30/17 at 11:30a. STAT xray placed. Nothing further needed.

## 2017-12-29 NOTE — Telephone Encounter (Signed)
Please advise Dr Panosh, thanks.   

## 2017-12-29 NOTE — Telephone Encounter (Signed)
Per Dr Regis Bill, need more clinical information.   Can see her in office, do an xray and then refer if needed OR do a referral to Sports Medicine.  ---- Hulen Skains and spoke with patient, Apologized for the delay in getting back with her. Pt states that the Prednisone worked in the beginning for the first 2 days then symptoms returned. Pt still having pain, redness and swelling in her big toe. Pt wants to see Dr Regis Bill, get xray and maybe try bigger dose of Prednisone and then referral to Sports Medicine if needed.  Appt scheduled for 12/30/17 at 11:30a. .Nothing further needed.

## 2017-12-29 NOTE — Telephone Encounter (Signed)
Copied from [place] [id number]. Topic: General - Other >> Dec 29, 2017 12:52 PM Yvette Rack wrote: Reason for CRM: Pt states she sent a [facility] message but has not received a reply.Pt states she was prescribed Prednisone but it is not working. Pt did not want to schedule an appt at this time she just asked that a message be sent and have her call returned. Cb# [phone]

## 2017-12-30 ENCOUNTER — Encounter: Payer: Self-pay | Admitting: Internal Medicine

## 2017-12-30 ENCOUNTER — Ambulatory Visit (INDEPENDENT_AMBULATORY_CARE_PROVIDER_SITE_OTHER): Payer: Medicare Other | Admitting: Internal Medicine

## 2017-12-30 ENCOUNTER — Ambulatory Visit (INDEPENDENT_AMBULATORY_CARE_PROVIDER_SITE_OTHER): Payer: Medicare Other

## 2017-12-30 VITALS — BP 140/60 | HR 67 | Temp 97.8°F | Wt 198.7 lb

## 2017-12-30 DIAGNOSIS — M109 Gout, unspecified: Secondary | ICD-10-CM | POA: Diagnosis not present

## 2017-12-30 DIAGNOSIS — M25475 Effusion, left foot: Secondary | ICD-10-CM | POA: Diagnosis not present

## 2017-12-30 DIAGNOSIS — M7989 Other specified soft tissue disorders: Secondary | ICD-10-CM | POA: Diagnosis not present

## 2017-12-30 MED ORDER — TRAMADOL HCL 50 MG PO TABS: 50.0000 mg | ORAL_TABLET | Freq: Three times a day (TID) | ORAL | 0 refills | Status: DC | PRN

## 2017-12-30 MED ORDER — METHYLPREDNISOLONE ACETATE 80 MG/ML IJ SUSP
120.0000 mg | Freq: Once | INTRAMUSCULAR | Status: AC
  Administered 2017-12-30: 120 mg via INTRAMUSCULAR

## 2017-12-30 NOTE — Progress Notes (Signed)
Chief Complaint  Patient presents with  . Follow-up    Pred. helped for a few days the symptoms worsened, pt states friday it was the worst it has been    HPI: Rebecca Mcintyre 72 y.o. come in for   Fu swelling  Left mtp joint and foot without trauma resumed gout  hgot 5 days of  40 pred and had initial improvement but then back up again still pain and swelling  Even with rest this seekend  has taken some ibuprofen no fever or progression and just throbbing  Sx  Noted.    Jittery with pred.  ROS: See pertinent positives and negatives per HPI.  Past Medical History:  Diagnosis Date  . Allergy   . ASCUS on Pap smear    had repeat x 2 normal with [name] Plan 2011  . Gout   . Hyperlipidemia   . Hypertension   . Myocardial infarction [facility])    2002 95% proximal LAD stenosis with thrombus followed by 90% stenosis, the first diagonal 80% stenosis, circumflex 30% stenosis, circumflex obtuse marginal subbranch 70% stenosis, PDA 40% stenosis. She had a drug-eluting stent placement with a Pixel stent  . Osteopenia   . Solitary kidney     Family History  Problem Relation Age of Onset  . Breast cancer Mother 40  . Lung cancer Father   . Heart attack Brother        Died age 75 MI  . Colon cancer Brother   . Heart disease Daughter 85       smoker and overweight   . Esophageal cancer Neg Hx   . Pancreatic cancer Neg Hx   . Rectal cancer Neg Hx   . Stomach cancer Neg Hx     Social History   Socioeconomic History  . Marital status: Married    Spouse name: Not on file  . Number of children: 2  . Years of education: Not on file  . Highest education level: Not on file  Occupational History    Employer: [employer]  . Financial resource strain: Not on file  . Food insecurity:    Worry: Not on file    Inability: Not on file  . Transportation needs:    Medical: Not on file    Non-medical: Not on file  Tobacco Use  . Smoking status: Former Smoker    Types: Cigarettes    . Smokeless tobacco: Never Used  . Tobacco comment: 37 years  Substance and Sexual Activity  . Alcohol use: Yes    Comment: 1-2 per month  . Drug use: No  . Sexual activity: Not on file  Lifestyle  . Physical activity:    Days per week: Not on file    Minutes per session: Not on file  . Stress: Not on file  Relationships  . Social connections:    Talks on phone: Not on file    Gets together: Not on file    Attends religious service: Not on file    Active member of club or organization: Not on file    Attends meetings of clubs or organizations: Not on file    Relationship status: Not on file  Other Topics Concern  . 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 aspirin 81 MG tablet Take 81 mg by mouth daily.    . metFORMIN (GLUCOPHAGE) 500 MG tablet Take 1 tablet (500 mg total) by mouth 2 (two) times daily with a meal. NEED OFFICE VISIT FOR FURTHER REFILLS! 180 tablet 0  . metoprolol tartrate (LOPRESSOR) 25 MG tablet Take 0.5 tablets (12.5 mg total) 2 (two) times daily by mouth. 90 tablet 1  . montelukast (SINGULAIR) 10 MG tablet Take 1 tablet (10 mg total) at bedtime by mouth. 30 tablet 6  . MULTIPLE VITAMIN PO Take 1 tablet by mouth daily.     . nitroGLYCERIN (NITROSTAT) 0.4 MG SL tablet Place 1 tablet (0.4 mg total) under the tongue every 5 (five) minutes as needed. 30 tablet 0  . predniSONE (DELTASONE) 20 MG tablet Take 1 tablet (20 mg total) by mouth 2 (two) times daily with a meal. 14 tablet 0  . simvastatin (ZOCOR) 40 MG tablet Take 1 tablet (40 mg total) at bedtime by mouth. 90 tablet 1   No facility-administered medications prior to visit.      EXAM:  BP 140/60 (BP Location: Left Arm, Patient Position: Sitting, Cuff Size: Normal)   Pulse 67    Temp 97.8 F (36.6 C) (Oral)   Wt 198 lb 11.2 oz (90.1 kg)   SpO2 (!) 87%   BMI 35.48 kg/m   Body mass index is 35.48 kg/m.  GENERAL: vitals reviewed and listed above, alert, oriented, appears well hydrated and in no acute distress HEENT: atraumatic, conjunctiva  clear, no obvious abnormalities on inspection of external nose and ears MS: moves all extremities left mtpo  Still dark red link warm  But not excruciation to touch   X ray  No acute findings x some djd and st swelling  PSYCH: pleasant and cooperative, no obvious depression or anxiety  BP Readings from Last 3 Encounters:  12/30/17 140/60  12/22/17 122/64  03/10/17 102/64    ASSESSMENT AND PLAN:  Discussed the following assessment and plan:  Swelling of first metatarsophalangeal (MTP) joint of left foot - Plan: Ambulatory referral to Sports Medicine, methylPREDNISolone acetate (DEPO-MEDROL) injection 120 mg Continue swelling  suboprimal  resonse to short course pred .40 mg    Still acts like gout   Disc  Dxdef with aspiration and injection  .  Boost steroid today DepoMedrol  120  Tramadol if needed at night  With caution   and referral to  [facility] for help assessment .   Disc prevention for gout. At fu future  .    Candidate for allopurinol etc  -Patient advised to return or notify health care team  if  new concerns arise.  Patient Instructions  Still acts like gout       X ray no surprises   DepoMedrol today   This is a stronger  Steroid   Treatment.  To hopefully get  Faster  Relief.   The other medication used is colchicine but has more potential side  effects .     Plan urica acid when better .and plan for prevenbtion  Plan   Referral to our sports medicine  For consult    Opinion.      Standley Brooking. Panosh M.D.

## 2017-12-30 NOTE — Patient Instructions (Addendum)
Still acts like gout       X ray no surprises   DepoMedrol today   This is a stronger  Steroid   Treatment.  To hopefully get  Faster  Relief.   The other medication used is colchicine but has more potential side  effects .     Plan urica acid when better .and plan for prevenbtion  Plan   Referral to our sports medicine  For consult    Opinion.

## 2018-01-01 ENCOUNTER — Ambulatory Visit (INDEPENDENT_AMBULATORY_CARE_PROVIDER_SITE_OTHER): Payer: Medicare Other | Admitting: Family Medicine

## 2018-01-01 ENCOUNTER — Other Ambulatory Visit: Payer: Self-pay | Admitting: Internal Medicine

## 2018-01-01 ENCOUNTER — Encounter: Payer: Self-pay | Admitting: Family Medicine

## 2018-01-01 VITALS — BP 116/68 | HR 76 | Ht 62.08 in

## 2018-01-01 DIAGNOSIS — M79675 Pain in left toe(s): Secondary | ICD-10-CM | POA: Diagnosis not present

## 2018-01-01 MED ORDER — NAPROXEN 500 MG PO TABS: 500.0000 mg | ORAL_TABLET | Freq: Two times a day (BID) | ORAL | 0 refills | Status: DC

## 2018-01-01 NOTE — Patient Instructions (Signed)
Nice to meet you  Please take the naproxen twice daily until 48 hours after the pain has gone away  Please let me know if there is no improvement.

## 2018-01-01 NOTE — Assessment & Plan Note (Signed)
Clinically appears to be gout.  Has been through 2 courses of steroids with limited improvement.   -Injection into the first MTP joint. - Tried to aspirate today but unsuccessful. -Initiate a course of naproxen.  She does have one kidney and counseled on naproxen use. -If no improvement may need to consider a course of antibiotics.

## 2018-01-01 NOTE — Progress Notes (Signed)
Rebecca Mcintyre - 72 y.o. female MRN [id number]  Date of birth: [birth year]  SUBJECTIVE:  Including CC & ROS.  Chief Complaint  Patient presents with  . Gout    Rebecca Mcintyre is a 72 y.o. female that is presenting with left 1st MTP joint pain. Symptoms began four weeks ago, denies injury or event. Located in her left big toe. History of gout in the same toe six months ago, she recalls her symptoms subsided after three days. She completed a course of prednisone on 12/27/17, she received a intramuscular steroid injection on 12/30/17. Her symptoms have not improved. Redness and swelling present. She has been been applying ice and keeping it elevated. Denies any fevers.   She has one kidney.   Independent review of the foot x-ray from 8/6 shows mild degenerative changes of the first MTP point joint.  She has a calcaneal plantar spur.  Review of the lab work from 2010 shows a uric acid 8.2.   Review of Systems  Constitutional: Negative for fever.  HENT: Negative for congestion.   Respiratory: Negative for cough.   Cardiovascular: Negative for chest pain.  Gastrointestinal: Negative for abdominal pain.  Musculoskeletal: Positive for arthralgias.  Skin: Positive for color change.  Neurological: Negative for weakness.  Hematological: Negative for adenopathy.  Psychiatric/Behavioral: Negative for agitation.    HISTORY: Past Medical, Surgical, Social, and Family History Reviewed & Updated per EMR.   Pertinent Historical Findings include:  Past Medical History:  Diagnosis Date  . Allergy   . ASCUS on Pap smear    had repeat x 2 normal with [name] Plan 2011  . Gout   . Hyperlipidemia   . Hypertension   . Myocardial infarction [facility])    2002 95% proximal LAD stenosis with thrombus followed by 90% stenosis, the first diagonal 80% stenosis, circumflex 30% stenosis, circumflex obtuse marginal subbranch 70% stenosis, PDA 40% stenosis. She had a drug-eluting stent placement with a Pixel stent    . Osteopenia   . Solitary kidney     Past Surgical History:  Procedure Laterality Date  . [place]  2011  . LAD Stent angioplasty  2002  . Right oophorectomy with salpingectomy     benign growth Dr. Joneen Caraway    Allergies  Allergen Reactions  . Atorvastatin     REACTION: myalgias  and liver    Family History  Problem Relation Age of Onset  . Breast cancer Mother 49  . Lung cancer Father   . Heart attack Brother        Died age 40 MI  . Colon cancer Brother   . Heart disease Daughter 91       smoker and overweight   . Esophageal cancer Neg Hx   . Pancreatic cancer Neg Hx   . Rectal cancer Neg Hx   . Stomach cancer Neg Hx      Social History   Socioeconomic History  . Marital status: Married    Spouse name: Not on file  . Number of children: 2  . Years of education: Not on file  . Highest education level: Not on file  Occupational History    Employer: [employer]  . Financial resource strain: Not on file  . Food insecurity:    Worry: Not on file    Inability: Not on file  . Transportation needs:    Medical: Not on file    Non-medical: Not on file  Tobacco Use  . Smoking status:  Former Smoker    Types: Cigarettes  . Smokeless tobacco: Never Used  . Tobacco comment: 37 years  Substance and Sexual Activity  . Alcohol use: Yes    Comment: 1-2 per month  . Drug use: No  . Sexual activity: Not on file  Lifestyle  . Physical activity:    Days per week: Not on file    Minutes per session: Not on file  . Stress: Not on file  Relationships  . Social connections:    Talks on phone: Not on file    Gets together: Not on file    Attends religious service: Not on file    Active member of club or organization: Not on file    Attends meetings of clubs or organizations: Not on file    Relationship status: Not on file  . Intimate partner violence:    Fear of current or ex partner: Not on file    Emotionally abused: Not on file     Physically abused: Not on file    Forced sexual activity: Not on file  Other Topics Concern  . 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    No pets        PHYSICAL EXAM:  VS: BP 116/68 (BP Location: Left Arm, Patient Position: Sitting, Cuff Size: Normal)   Pulse 76   Ht 5' 2.08" (1.577 m)   SpO2 95%   BMI 36.25 kg/m  Physical Exam Gen: NAD, alert, cooperative with exam, well-appearing ENT: normal lips, normal nasal mucosa,  Eye: normal EOM, normal conjunctiva and lids CV:  no edema, +2 pedal pulses   Resp: no accessory muscle use, non-labored,  Skin: no rashes, no areas of induration  Neuro: normal tone, normal sensation to touch Psych:  normal insight, alert and oriented MSK:  Left foot:  Redness and swelling of the 1st MTP joint  Pain with flexion and extension  No streaking  No limping with gait  Neurovascularly intact    Aspiration/Injection Procedure Note Rebecca Mcintyre [birth year]  Procedure: Aspiration and Injection Indications: left great toe pain   Procedure Details Consent: Risks of procedure as well as the alternatives and risks of each were explained to the (patient/caregiver).  Consent for procedure obtained. Time Out: Verified patient identification, verified procedure, site/side was marked, verified correct patient position, special equipment/implants available, medications/allergies/relevent history reviewed, required imaging and test results available.  Performed.  The area was cleaned with iodine and alcohol swabs.    A syringe containing 3 cc of 1% lidocaine without epinephrine was injected into the skin into the tract.  An 18-gauge inch and half needle was inserted into the first left MTP joint under ultrasound guidance.  Try to obtain aspiration but was unsuccessful.  A mixture of 1 cc's of 40 mg Depomedrol and 1 cc's of  1% lidocaine was injected into the joint.  Ultrasound was used. Images were obtained in Transverse views showing the injection.    A sterile dressing was applied.  Patient did tolerate procedure well.        ASSESSMENT & PLAN:   Great toe pain, left Clinically appears to be gout.  Has been through 2 courses of steroids with limited improvement.   -Injection into the first MTP joint. - Tried to aspirate today but unsuccessful. -Initiate a course  of naproxen.  She does have one kidney and counseled on naproxen use. -If no improvement may need to consider a course of antibiotics.

## 2018-01-02 ENCOUNTER — Encounter: Payer: Self-pay | Admitting: Family Medicine

## 2018-01-02 DIAGNOSIS — L720 Epidermal cyst: Secondary | ICD-10-CM | POA: Diagnosis not present

## 2018-01-02 DIAGNOSIS — L570 Actinic keratosis: Secondary | ICD-10-CM | POA: Diagnosis not present

## 2018-01-02 DIAGNOSIS — B078 Other viral warts: Secondary | ICD-10-CM | POA: Diagnosis not present

## 2018-01-02 DIAGNOSIS — D485 Neoplasm of uncertain behavior of skin: Secondary | ICD-10-CM | POA: Diagnosis not present

## 2018-01-02 DIAGNOSIS — L821 Other seborrheic keratosis: Secondary | ICD-10-CM | POA: Diagnosis not present

## 2018-01-02 DIAGNOSIS — L814 Other melanin hyperpigmentation: Secondary | ICD-10-CM | POA: Diagnosis not present

## 2018-01-02 NOTE — Telephone Encounter (Signed)
FYI for you [name]:    Good morning [name],  I just wanted to thank you for the referral over to Dr. Raeford Razor. I saw him on Thursday and he decided to do a procedure where he drew out fluid on my toe and inserted medication directly to the site of the gout. This morning it is a thousand times better and I'm thrilled for the first time in weeks not to have this gout pain. Your truly are a team to help your patients and I appreciate the care you give me. Him and his nurse were so patient and kind and did their best to make it a fairly easy procedure.   Thanks again for being so proactive with my healthcare. I appreciate you so very much.  My best,  Rebecca Mcintyre   Nothing further is needed.

## 2018-01-07 ENCOUNTER — Other Ambulatory Visit: Payer: Self-pay | Admitting: Internal Medicine

## 2018-01-08 ENCOUNTER — Encounter: Payer: Self-pay | Admitting: Family Medicine

## 2018-01-09 ENCOUNTER — Other Ambulatory Visit (INDEPENDENT_AMBULATORY_CARE_PROVIDER_SITE_OTHER): Payer: Medicare Other

## 2018-01-09 ENCOUNTER — Encounter: Payer: Self-pay | Admitting: Family Medicine

## 2018-01-09 ENCOUNTER — Ambulatory Visit (INDEPENDENT_AMBULATORY_CARE_PROVIDER_SITE_OTHER): Payer: Medicare Other | Admitting: Family Medicine

## 2018-01-09 VITALS — BP 132/62 | HR 66 | Temp 97.8°F | Ht 62.0 in

## 2018-01-09 DIAGNOSIS — M79675 Pain in left toe(s): Secondary | ICD-10-CM

## 2018-01-09 LAB — CBC WITH DIFFERENTIAL/PLATELET
BASOS ABS: 0 10*3/uL (ref 0.0–0.1)
Basophils Relative: 0.6 % (ref 0.0–3.0)
Eosinophils Absolute: 0.1 10*3/uL (ref 0.0–0.7)
Eosinophils Relative: 1.6 % (ref 0.0–5.0)
HCT: 49.8 % — ABNORMAL HIGH (ref 36.0–46.0)
Hemoglobin: 17 g/dL — ABNORMAL HIGH (ref 12.0–15.0)
LYMPHS ABS: 2.7 10*3/uL (ref 0.7–4.0)
Lymphocytes Relative: 35.1 % (ref 12.0–46.0)
MCHC: 34.1 g/dL (ref 30.0–36.0)
MCV: 90.4 fl (ref 78.0–100.0)
MONO ABS: 0.8 10*3/uL (ref 0.1–1.0)
MONOS PCT: 10.7 % (ref 3.0–12.0)
NEUTROS PCT: 52 % (ref 43.0–77.0)
Neutro Abs: 4 10*3/uL (ref 1.4–7.7)
Platelets: 237 10*3/uL (ref 150.0–400.0)
RBC: 5.5 Mil/uL — AB (ref 3.87–5.11)
RDW: 13.7 % (ref 11.5–15.5)
WBC: 7.7 10*3/uL (ref 4.0–10.5)

## 2018-01-09 LAB — SEDIMENTATION RATE: Sed Rate: 9 mm/hr (ref 0–30)

## 2018-01-09 LAB — C-REACTIVE PROTEIN: CRP: 0.1 mg/dL — AB (ref 0.5–20.0)

## 2018-01-09 LAB — URIC ACID: URIC ACID, SERUM: 7.7 mg/dL — AB (ref 2.4–7.0)

## 2018-01-09 MED ORDER — CEPHALEXIN 500 MG PO CAPS: 500.0000 mg | ORAL_CAPSULE | Freq: Two times a day (BID) | ORAL | 0 refills | Status: DC

## 2018-01-09 NOTE — Patient Instructions (Signed)
Good to see you  I will call you with the results from today  Please let me know if the toe doesn't improve

## 2018-01-09 NOTE — Assessment & Plan Note (Signed)
Reports that she had pus draining from the great toe yesterday.  Possible that this could have been steroid from the previous injection.  Aspiration was attempted today under ultrasound guidance unable to aspirate any fluid.  Hypoechoic area seen under ultrasound may be inflammation of the capsule related to the gout.  Clinically her toe does appear to be better after the injection aspiration last week. -Keflex. -CBC, CRP, sed rate, uric acid. -If no improvement may need to consider surgery or further imaging.

## 2018-01-09 NOTE — Progress Notes (Deleted)
Rebecca Mcintyre - 72 y.o. female MRN [id number]  Date of birth: [birth year]  SUBJECTIVE:  Including CC & ROS.  Chief Complaint  Patient presents with  . Follow-up    Rebecca Mcintyre is a 72 y.o. female that is  ***.  ***   Review of Systems  HISTORY: Past Medical, Surgical, Social, and Family History Reviewed & Updated per EMR.   Pertinent Historical Findings include:  Past Medical History:  Diagnosis Date  . Allergy   . ASCUS on Pap smear    had repeat x 2 normal with [name] Plan 2011  . Gout   . Hyperlipidemia   . Hypertension   . Myocardial infarction [facility])    2002 95% proximal LAD stenosis with thrombus followed by 90% stenosis, the first diagonal 80% stenosis, circumflex 30% stenosis, circumflex obtuse marginal subbranch 70% stenosis, PDA 40% stenosis. She had a drug-eluting stent placement with a Pixel stent  . Osteopenia   . Solitary kidney     Past Surgical History:  Procedure Laterality Date  . [place]  2011  . LAD Stent angioplasty  2002  . Right oophorectomy with salpingectomy     benign growth Dr. Joneen Caraway    Allergies  Allergen Reactions  . Atorvastatin     REACTION: myalgias  and liver    Family History  Problem Relation Age of Onset  . Breast cancer Mother 77  . Lung cancer Father   . Heart attack Brother        Died age 49 MI  . Colon cancer Brother   . Heart disease Daughter 49       smoker and overweight   . Esophageal cancer Neg Hx   . Pancreatic cancer Neg Hx   . Rectal cancer Neg Hx   . Stomach cancer Neg Hx      Social History   Socioeconomic History  . Marital status: Married    Spouse name: Not on file  . Number of children: 2  . Years of education: Not on file  . Highest education level: Not on file  Occupational History    Employer: [employer]  . Financial resource strain: Not on file  . Food insecurity:    Worry: Not on file    Inability: Not on file  . Transportation needs:    Medical: Not  on file    Non-medical: Not on file  Tobacco Use  . Smoking status: Former Smoker    Types: Cigarettes  . Smokeless tobacco: Never Used  . Tobacco comment: 37 years  Substance and Sexual Activity  . Alcohol use: Yes    Comment: 1-2 per month  . Drug use: No  . Sexual activity: Not on file  Lifestyle  . Physical activity:    Days per week: Not on file    Minutes per session: Not on file  . Stress: Not on file  Relationships  . Social connections:    Talks on phone: Not on file    Gets together: Not on file    Attends religious service: Not on file    Active member of club or organization: Not on file    Attends meetings of clubs or organizations: Not on file    Relationship status: Not on file  . Intimate partner violence:    Fear of current or ex partner: Not on file    Emotionally abused: Not on file    Physically abused: Not on file  Forced sexual activity: Not on file  Other Topics Concern  . 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

## 2018-01-09 NOTE — Progress Notes (Signed)
Rebecca Mcintyre - 72 y.o. female MRN [id number]  Date of birth: 10-24-1945  SUBJECTIVE:  Including CC & ROS.  Chief Complaint  Patient presents with  . Follow-up    Rebecca Mcintyre is a 72 y.o. female that is here today for left great toe pain. Her pain has improved. She noticed some draining yesterday. This morning drainage has stopped. Admits to tenderness. Pain is localized to her toe. Denies pain when she is ambulating. No fevers or chills. Redness has improved.   Review of Systems  Constitutional: Negative for fever.  HENT: Negative for congestion.   Respiratory: Negative for cough.   Cardiovascular: Negative for chest pain.  Gastrointestinal: Negative for abdominal pain.  Musculoskeletal: Positive for joint swelling.  Skin: Positive for color change.  Neurological: Negative for weakness.  Hematological: Negative for adenopathy.  Psychiatric/Behavioral: Negative for agitation.    HISTORY: Past Medical, Surgical, Social, and Family History Reviewed & Updated per EMR.   Pertinent Historical Findings include:  Past Medical History:  Diagnosis Date  . Allergy   . ASCUS on Pap smear    had repeat x 2 normal with [name] Plan 2011  . Gout   . Hyperlipidemia   . Hypertension   . Myocardial infarction [facility])    2002 95% proximal LAD stenosis with thrombus followed by 90% stenosis, the first diagonal 80% stenosis, circumflex 30% stenosis, circumflex obtuse marginal subbranch 70% stenosis, PDA 40% stenosis. She had a drug-eluting stent placement with a Pixel stent  . Osteopenia   . Solitary kidney     Past Surgical History:  Procedure Laterality Date  . [place]  2011  . LAD Stent angioplasty  2002  . Right oophorectomy with salpingectomy     benign growth Dr. Joneen Caraway    Allergies  Allergen Reactions  . Atorvastatin     REACTION: myalgias  and liver    Family History  Problem Relation Age of Onset  . Breast cancer Mother 31  . Lung cancer Father   . Heart  attack Brother        Died age 66 MI  . Colon cancer Brother   . Heart disease Daughter 65       smoker and overweight   . Esophageal cancer Neg Hx   . Pancreatic cancer Neg Hx   . Rectal cancer Neg Hx   . Stomach cancer Neg Hx      Social History   Socioeconomic History  . Marital status: Married    Spouse name: Not on file  . Number of children: 2  . Years of education: Not on file  . Highest education level: Not on file  Occupational History    Employer: [employer]  . Financial resource strain: Not on file  . Food insecurity:    Worry: Not on file    Inability: Not on file  . Transportation needs:    Medical: Not on file    Non-medical: Not on file  Tobacco Use  . Smoking status: Former Smoker    Types: Cigarettes  . Smokeless tobacco: Never Used  . Tobacco comment: 37 years  Substance and Sexual Activity  . Alcohol use: Yes    Comment: 1-2 per month  . Drug use: No  . Sexual activity: Not on file  Lifestyle  . Physical activity:    Days per week: Not on file    Minutes per session: Not on file  . Stress: Not on file  Relationships  .  Social connections:    Talks on phone: Not on file    Gets together: Not on file    Attends religious service: Not on file    Active member of club or organization: Not on file    Attends meetings of clubs or organizations: Not on file    Relationship status: Not on file  . Intimate partner violence:    Fear of current or ex partner: Not on file    Emotionally abused: Not on file    Physically abused: Not on file    Forced sexual activity: Not on file  Other Topics Concern  . 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    No pets        PHYSICAL EXAM:  VS: BP 132/62 (BP Location: Left Arm, Patient Position: Sitting, Cuff Size: Normal)   Pulse 66   Temp 97.8 F (36.6 C)  (Oral)   Ht 5' 2" (1.575 m)   SpO2 98%   BMI 36.34 kg/m  Physical Exam Gen: NAD, alert, cooperative with exam, well-appearing ENT: normal lips, normal nasal mucosa,  Eye: normal EOM, normal conjunctiva and lids CV:  no edema, +2 pedal pulses   Resp: no accessory muscle use, non-labored,  Skin: no rashes, normal turgor Neuro: normal tone, normal sensation to touch Psych:  normal insight, alert and oriented MSK:  Left foot:  Redness on the dorsal aspect of the great toe TTP of the 1st MTP joint  No streaking.  No drainage  Pain with flexion and extension  Neurovascularly intact    Aspiration/Injection Procedure Note Rebecca Mcintyre 11/12/1945  Procedure: Aspiration Indications: left great toe pain   Procedure Details Consent: Risks of procedure as well as the alternatives and risks of each were explained to the (patient/caregiver).  Consent for procedure obtained. Time Out: Verified patient identification, verified procedure, site/side was marked, verified correct patient position, special equipment/implants available, medications/allergies/relevent history reviewed, required imaging and test results available.  Performed.  The area was cleaned with iodine and alcohol swabs.    The left great toe was injected with 2 cc of 1% lidocaine without epinephrine to anesthetize the skin in the tract.  An 18-gauge needle was inserted under ultrasound guidance into the hypoechoic area under ultrasound guidance.  A 60 cc syringe was used and aspiration was unable to be accomplished.    Amount of Fluid Aspirated: none  A sterile dressing was applied.  Patient did tolerate procedure well.    ASSESSMENT & PLAN:   Great toe pain, left Reports that she had pus draining from the great toe yesterday.  Possible that this could have been steroid from the previous injection.  Aspiration was attempted today under ultrasound guidance unable to aspirate any fluid.  Hypoechoic area seen under  ultrasound may be inflammation of the capsule related to the gout.  Clinically her toe does appear to be better after the injection aspiration last week. -Keflex. -CBC, CRP, sed rate, uric acid. -If no improvement may need to consider surgery or further imaging.

## 2018-01-12 ENCOUNTER — Telehealth: Payer: Self-pay | Admitting: Internal Medicine

## 2018-01-12 ENCOUNTER — Telehealth: Payer: Self-pay | Admitting: Family Medicine

## 2018-01-12 MED ORDER — ALLOPURINOL 100 MG PO TABS: 100.0000 mg | ORAL_TABLET | Freq: Every day | ORAL | 2 refills | Status: DC

## 2018-01-12 NOTE — Telephone Encounter (Signed)
Please advise, patient still had some concerns regarding labs.

## 2018-01-12 NOTE — Telephone Encounter (Signed)
Spoke with patient about her results. Will start allopurinol.   Rosemarie Ax, MD [facility] 01/12/2018, 1:56 PM

## 2018-01-12 NOTE — Addendum Note (Signed)
Addended by: Rosemarie Ax on: 01/12/2018 01:43 PM   Modules accepted: Orders

## 2018-01-12 NOTE — Telephone Encounter (Signed)
Pt returned call from Dr. Raeford Razor regarding her message for lab result and recommendation. Pt voiced understanding and that she is requesting to talk with Dr. Raeford Razor any time today. So please give her a call back today.

## 2018-01-12 NOTE — Telephone Encounter (Signed)
Left VM for patient. If she calls back please have him speak with a nurse/CMA and inform that her uric acid is elevated. Can consider starting a medication for gout prophylaxis. No signs of infection based on her lab work. The [facility] can report results to patient.   If any questions then please take the best time and phone number to call and I will try to call her back.   Rosemarie Ax, MD [facility] 01/12/2018, 8:37 AM

## 2018-01-12 NOTE — Addendum Note (Signed)
Addended by: Rosemarie Ax on: 01/12/2018 01:56 PM   Modules accepted: Orders

## 2018-01-20 ENCOUNTER — Encounter: Payer: Self-pay | Admitting: Family Medicine

## 2018-01-21 NOTE — Progress Notes (Signed)
Rebecca Mcintyre - 72 y.o. female MRN [id number]  Date of birth: [birth year]  SUBJECTIVE:  Including CC & ROS.  Chief Complaint  Patient presents with  . Follow-up    Rebecca Mcintyre is a 72 y.o. female that is here today for left great toe pain follow up. She completed keflex last week. Four days ago her toe started draining. Redness and tenderness to the touch. Denies fevers. She has been taking motrin.  The first MTP joint on the left foot has been aspirated and injected one time and aspiration a second time.  There is nothing that resulted in the aspiration.  She denies any fevers or streaking.  Has completed a course of anti-inflammatories and prednisone with no improvement.  Uric acid was found to be elevated and allopurinol was started.   Review of Systems  Constitutional: Negative for fever.  HENT: Negative for congestion.   Respiratory: Negative for cough.   Cardiovascular: Negative for chest pain.  Gastrointestinal: Negative for abdominal pain.  Musculoskeletal: Positive for joint swelling.  Skin: Positive for color change.  Neurological: Negative for weakness.  Hematological: Negative for adenopathy.  Psychiatric/Behavioral: Negative for agitation.    HISTORY: Past Medical, Surgical, Social, and Family History Reviewed & Updated per EMR.   Pertinent Historical Findings include:  Past Medical History:  Diagnosis Date  . Allergy   . ASCUS on Pap smear    had repeat x 2 normal with [name] Plan 2011  . Gout   . Hyperlipidemia   . Hypertension   . Myocardial infarction [facility])    2002 95% proximal LAD stenosis with thrombus followed by 90% stenosis, the first diagonal 80% stenosis, circumflex 30% stenosis, circumflex obtuse marginal subbranch 70% stenosis, PDA 40% stenosis. She had a drug-eluting stent placement with a Pixel stent  . Osteopenia   . Solitary kidney     Past Surgical History:  Procedure Laterality Date  . [place]  2011  . LAD Stent angioplasty   2002  . Right oophorectomy with salpingectomy     benign growth Dr. Joneen Caraway    Allergies  Allergen Reactions  . Atorvastatin     REACTION: myalgias  and liver    Family History  Problem Relation Age of Onset  . Breast cancer Mother 22  . Lung cancer Father   . Heart attack Brother        Died age 39 MI  . Colon cancer Brother   . Heart disease Daughter 7       smoker and overweight   . Esophageal cancer Neg Hx   . Pancreatic cancer Neg Hx   . Rectal cancer Neg Hx   . Stomach cancer Neg Hx      Social History   Socioeconomic History  . Marital status: Married    Spouse name: Not on file  . Number of children: 2  . Years of education: Not on file  . Highest education level: Not on file  Occupational History    Employer: [employer]  . Financial resource strain: Not on file  . Food insecurity:    Worry: Not on file    Inability: Not on file  . Transportation needs:    Medical: Not on file    Non-medical: Not on file  Tobacco Use  . Smoking status: Former Smoker    Types: Cigarettes  . Smokeless tobacco: Never Used  . Tobacco comment: 37 years  Substance and Sexual Activity  . Alcohol use: Yes  Comment: 1-2 per month  . Drug use: No  . Sexual activity: Not on file  Lifestyle  . Physical activity:    Days per week: Not on file    Minutes per session: Not on file  . Stress: Not on file  Relationships  . Social connections:    Talks on phone: Not on file    Gets together: Not on file    Attends religious service: Not on file    Active member of club or organization: Not on file    Attends meetings of clubs or organizations: Not on file    Relationship status: Not on file  . Intimate partner violence:    Fear of current or ex partner: Not on file    Emotionally abused: Not on file    Physically abused: Not on file    Forced sexual activity: Not on file  Other Topics Concern  . Not on file  Social History Narrative   HH of 1     Regular exercise-sometimes    6-7 hours sleep   Divorced   Occupation: Cabin crew working full time   Works with daughter    No pets        PHYSICAL EXAM:  VS: BP 122/76 (BP Location: Left Arm, Patient Position: Sitting, Cuff Size: Normal)   Pulse 68   Ht 5' 2" (1.575 m)   SpO2 96%   BMI 36.34 kg/m  Physical Exam Gen: NAD, alert, cooperative with exam, well-appearing ENT: normal lips, normal nasal mucosa,  Eye: normal EOM, normal conjunctiva and lids CV:  no edema, +2 pedal pulses   Resp: no accessory muscle use, non-labored,  Skin:  no areas of induration  Neuro: normal tone, normal sensation to touch Psych:  normal insight, alert and oriented MSK:  Left great toe: Redness and swelling occurring on the medial aspect of the first MTP joint. Tenderness to palpation of this area. No streaking. Limited flexion extension of the first great toe. Ulcerative lesion occurring on the medial aspect the first MTP joint with expression of white fluid. Neurovascular intact         ASSESSMENT & PLAN:   Great toe pain, left Unclear if this is still gout as opposed to infection.  She did have complete resolution on Sunday and her symptoms started back on Monday.  Aspiration was attempted but nothing was able to be withdrawn.  Have provided an injection into the joint with steroid.  Completed a course of Keflex. -Blood cultures today -MRI of the left great toe with and without contrast to evaluate for infection concerning for osteo-myelitis.

## 2018-01-22 ENCOUNTER — Other Ambulatory Visit: Payer: Medicare Other

## 2018-01-22 ENCOUNTER — Ambulatory Visit (INDEPENDENT_AMBULATORY_CARE_PROVIDER_SITE_OTHER): Payer: Medicare Other | Admitting: Family Medicine

## 2018-01-22 ENCOUNTER — Encounter: Payer: Self-pay | Admitting: Family Medicine

## 2018-01-22 VITALS — BP 122/76 | HR 68 | Ht 62.0 in

## 2018-01-22 DIAGNOSIS — M79675 Pain in left toe(s): Secondary | ICD-10-CM | POA: Diagnosis not present

## 2018-01-22 DIAGNOSIS — M109 Gout, unspecified: Secondary | ICD-10-CM | POA: Diagnosis not present

## 2018-01-22 NOTE — Assessment & Plan Note (Signed)
Unclear if this is still gout as opposed to infection.  She did have complete resolution on Sunday and her symptoms started back on Monday.  Aspiration was attempted but nothing was able to be withdrawn.  Have provided an injection into the joint with steroid.  Completed a course of Keflex. -Blood cultures today -MRI of the left great toe with and without contrast to evaluate for infection concerning for osteo-myelitis.

## 2018-01-22 NOTE — Patient Instructions (Signed)
Good to see you  We'll try to get this MRI order as soon as we can  Please try to medications to control your pain and we'll be in touch Please seek immediate care if the

## 2018-01-24 ENCOUNTER — Ambulatory Visit
Admission: RE | Admit: 2018-01-24 | Discharge: 2018-01-24 | Disposition: A | Discharge status: Home / Self Care | Payer: Medicare Other | Source: Ambulatory Visit | Attending: Family Medicine | Admitting: Family Medicine

## 2018-01-24 DIAGNOSIS — M79675 Pain in left toe(s): Secondary | ICD-10-CM

## 2018-01-24 DIAGNOSIS — S91102A Unspecified open wound of left great toe without damage to nail, initial encounter: Secondary | ICD-10-CM | POA: Diagnosis not present

## 2018-01-24 DIAGNOSIS — M109 Gout, unspecified: Secondary | ICD-10-CM

## 2018-01-24 MED ORDER — GADOBENATE DIMEGLUMINE 529 MG/ML IV SOLN
17.0000 mL | Freq: Once | INTRAVENOUS | Status: AC | PRN
  Administered 2018-01-24: 17 mL via INTRAVENOUS

## 2018-01-27 ENCOUNTER — Telehealth: Payer: Self-pay | Admitting: Family Medicine

## 2018-01-27 DIAGNOSIS — M109 Gout, unspecified: Secondary | ICD-10-CM

## 2018-01-27 DIAGNOSIS — M79675 Pain in left toe(s): Secondary | ICD-10-CM

## 2018-01-27 NOTE — Telephone Encounter (Signed)
Spoke with patient about her MRI and blood culture. Will send to ortho for evaluation.   Rosemarie Ax, MD [facility] 01/27/2018, 8:17 AM

## 2018-01-28 ENCOUNTER — Other Ambulatory Visit: Payer: Self-pay | Admitting: Family Medicine

## 2018-01-29 LAB — CULTURE, BLOOD (SINGLE)
MICRO NUMBER:: 91035560
MICRO NUMBER:: 91035563

## 2018-01-30 DIAGNOSIS — M79672 Pain in left foot: Secondary | ICD-10-CM | POA: Diagnosis not present

## 2018-01-31 ENCOUNTER — Encounter: Payer: Self-pay | Admitting: Family Medicine

## 2018-02-02 ENCOUNTER — Encounter: Payer: Self-pay | Admitting: Family Medicine

## 2018-02-09 ENCOUNTER — Encounter: Payer: Self-pay | Admitting: Family Medicine

## 2018-02-16 ENCOUNTER — Encounter: Payer: Self-pay | Admitting: Family Medicine

## 2018-02-16 ENCOUNTER — Other Ambulatory Visit: Payer: Self-pay | Admitting: Family Medicine

## 2018-02-16 DIAGNOSIS — M79675 Pain in left toe(s): Secondary | ICD-10-CM

## 2018-02-20 ENCOUNTER — Encounter: Payer: Self-pay | Admitting: *Deleted

## 2018-02-24 ENCOUNTER — Ambulatory Visit (INDEPENDENT_AMBULATORY_CARE_PROVIDER_SITE_OTHER): Payer: Medicare Other

## 2018-02-24 ENCOUNTER — Ambulatory Visit: Payer: Medicare Other | Admitting: Podiatry

## 2018-02-24 ENCOUNTER — Encounter: Payer: Self-pay | Admitting: Podiatry

## 2018-02-24 VITALS — BP 113/63 | HR 77

## 2018-02-24 DIAGNOSIS — M869 Osteomyelitis, unspecified: Secondary | ICD-10-CM

## 2018-02-24 DIAGNOSIS — M1000 Idiopathic gout, unspecified site: Secondary | ICD-10-CM

## 2018-02-24 DIAGNOSIS — M79672 Pain in left foot: Secondary | ICD-10-CM | POA: Diagnosis not present

## 2018-02-24 MED ORDER — COLCHICINE 0.6 MG PO TABS: 0.6000 mg | ORAL_TABLET | Freq: Every day | ORAL | 0 refills | Status: DC

## 2018-02-24 NOTE — Patient Instructions (Signed)
Pre-Operative Instructions  Congratulations, you have decided to take an important step to improving your quality of life.  You can be assured that the doctors of [facility] will be with you every step of the way.  1. Plan to be at the surgery center/hospital at least 1 (one) hour prior to your scheduled time unless otherwise directed by the surgical center/hospital staff.  You must have a responsible adult accompany you, remain during the surgery and drive you home.  Make sure you have directions to the surgical center/hospital and know how to get there on time. 2. For hospital based surgery you will need to obtain a history and physical form from your family physician within 1 month prior to the date of surgery- we will give you a form for you primary physician.  3. We make every effort to accommodate the date you request for surgery.  There are however, times where surgery dates or times have to be moved.  We will contact you as soon as possible if a change in schedule is required.   4. No Aspirin/Ibuprofen for one week before surgery.  If you are on aspirin, any non-steroidal anti-inflammatory medications (Mobic, Aleve, Ibuprofen) you should stop taking it 7 days prior to your surgery.  You make take Tylenol  For pain prior to surgery.  5. Medications- If you are taking daily heart and blood pressure medications, seizure, reflux, allergy, asthma, anxiety, pain or diabetes medications, make sure the surgery center/hospital is aware before the day of surgery so they may notify you which medications to take or avoid the day of surgery. 6. No food or drink after midnight the night before surgery unless directed otherwise by surgical center/hospital staff. 7. No alcoholic beverages 24 hours prior to surgery.  No smoking 24 hours prior to or 24 hours after surgery. 8. Wear loose pants or shorts- loose enough to fit over bandages, boots, and casts. 9. No slip on shoes, sneakers are best. 10. Bring  your boot with you to the surgery center/hospital.  Also bring crutches or a walker if your physician has prescribed it for you.  If you do not have this equipment, it will be provided for you after surgery. 11. If you have not been contracted by the surgery center/hospital by the day before your surgery, call to confirm the date and time of your surgery. 12. Leave-time from work may vary depending on the type of surgery you have.  Appropriate arrangements should be made prior to surgery with your employer. 13. Prescriptions will be provided immediately following surgery by your doctor.  Have these filled as soon as possible after surgery and take the medication as directed. 14. Remove nail polish on the operative foot. 15. Wash the night before surgery.  The night before surgery wash the foot and leg well with the antibacterial soap provided and water paying special attention to beneath the toenails and in between the toes.  Rinse thoroughly with water and dry well with a towel.  Perform this wash unless told not to do so by your physician.  Enclosed: 1 Ice pack (please put in freezer the night before surgery)   1 Hibiclens skin cleaner   Pre-op Instructions  If you have any questions regarding the instructions, do not hesitate to call our office at any point during this process.   [place]: [place], [place] [address] [place]: [address], [place], [place] [address] [phone]  Dr. Celesta Gentile, DPM --------------------------------  Colchicine; Probenecid oral tablet What is this medicine? COLCHICINE; PROBENECID (KOL chi seen; proe BEN e sid) is for joint pain and swelling due to gouty arthritis. This medicine may be used for other purposes; ask your health care provider or pharmacist if you have questions. What should I tell my health care provider before I take this medicine? They need to know if you have any of these conditions: -anemia -blood  disorders like leukemia or lymphoma -having an acute gouty attack -heart disease -immune system problems -intestinal disease -kidney disease, stones -liver disease -low platelet counts -stomach problems -an unusual or allergic reaction to colchicine, probenecid, other medicines, lactose, foods, dyes, or preservatives -pregnant or trying to get pregnant -breast-feeding How should I use this medicine? Take this medicine by mouth with a full glass of water. Follow the directions on the prescription label. Take your medicine at regular intervals. Do not take your medicine more often than directed. Do not stop taking except on your doctor's advice. Talk to your pediatrician regarding the use of this medicine in children. Special care may be needed. Overdosage: If you think you have taken too much of this medicine contact a poison control center or emergency room at once. NOTE: This medicine is only for you. Do not share this medicine with others. What if I miss a dose? If you miss a dose, take it as soon as you can. If it is almost time for your next dose, take only that dose. Do not take double or extra doses. What may interact with this medicine? Do not take this medicine with any of the following medications: -aspirin and aspirin-like drugs -certain medicines for fungal infections like itraconazole or ketoconazole -clarithromycin -erythromycin -ketorolac -methotrexate -topiramate This medicine may also interact with the following medications: -acetaminophen -alcohol -antibiotics including penicillins, sulfonamides -antiviral medicines such as acyclovir, famciclovir, ganciclovir -cyclosporine -epinephrine -lorazepam -meclofenamate -medicines for diabetes -medicines for sleep during surgery -methenamine -methotrexate -NSAIDs, medicines for pain and inflammation, like ibuprofen or naproxen -pyrazinamide -rifampin -sodium bicarbonate This list may not describe all possible  interactions. Give your health care provider a list of all the medicines, herbs, non-prescription drugs, or dietary supplements you use. Also tell them if you smoke, drink alcohol, or use illegal drugs. Some items may interact with your medicine. What should I watch for while using this medicine? Visit your doctor or health care professional for regular checks on your progress. You may need periodic blood checks. Aspirin and non-steroidal antiinflammatory drugs like ibuprofen can make this medicine less effective. Do not treat yourself for headaches or pain. Ask your doctor or health care professional for advice. Alcohol can increase the chance of getting stomach problems and gout attacks. Do not drink alcohol. You may need to be on a special diet while taking this medicine. Check with your doctor. Also, ask how many glasses of fluid you need to drink a day. You must not get dehydrated. This medicine can interfere with some urine glucose tests. If you use such tests talk with your health care professional. What side effects may I notice from receiving this medicine? Side effects that you should report to your doctor or health care professional as soon as possible: -allergic reactions like skin rash, itching or hives, swelling of the face, lips, or tongue -back or kidney pain -breathing problems -dark urine -fever, chills, or sore throat -numbness or tingling in hands or feet -trouble passing urine or change in the amount of urine -unusual bleeding or bruising -unusually weak  or tired Side effects that usually do not require medical attention (report to your doctor or health care professional if they continue or are bothersome): -diarrhea -hair loss -headache -loss of appetite -nausea, vomiting -stomach upset This list may not describe all possible side effects. Call your doctor for medical advice about side effects. You may report side effects to FDA at 1-800-FDA-1088. Where should I keep  my medicine? Keep out of the reach of children. Store at room temperature between 20 and 25 degrees C (68 and 77 degrees F). Keep container tightly closed. Throw away any unused medicine after the expiration date. NOTE: This sheet is a summary. It may not cover all possible information. If you have questions about this medicine, talk to your doctor, pharmacist, or health care provider.  2018 Elsevier/Gold Standard (2012-11-09 16:50:46)

## 2018-03-01 NOTE — Progress Notes (Signed)
Subjective:   Patient ID: Rebecca Mcintyre, female   DOB: 72 y.o.   MRN: [id number]   HPI 72 year old female presents the office today for concerns of gout to the left big toe which is been ongoing for last couple of months.  She states that she previously on prednisone and naproxen was not helpful.  She also has had an aspiration, injection to the area.  She has noticed some clear, white drainage coming from the area she points along the medial first metatarsal head.  She states is not been healing well.  She did follow with orthopedics for this and she wants another opinion.  At this point she states that she is interested in possibly having a surgery that was discussed by orthopedics with her given the ongoing symptoms.  She is also had an MRI of the area as well.   Review of Systems  All other systems reviewed and are negative.  Past Medical History:  Diagnosis Date  . Allergy   . ASCUS on Pap smear    had repeat x 2 normal with [name] Plan 2011  . Gout   . Hyperlipidemia   . Hypertension   . Myocardial infarction [facility])    2002 95% proximal LAD stenosis with thrombus followed by 90% stenosis, the first diagonal 80% stenosis, circumflex 30% stenosis, circumflex obtuse marginal subbranch 70% stenosis, PDA 40% stenosis. She had a drug-eluting stent placement with a Pixel stent  . Osteopenia   . Solitary kidney     Past Surgical History:  Procedure Laterality Date  . [place]  2011  . LAD Stent angioplasty  2002  . Right oophorectomy with salpingectomy     benign growth Dr. Joneen Caraway     Current Outpatient Medications:  .  allopurinol (ZYLOPRIM) 100 MG tablet, Take 1 tablet (100 mg total) by mouth daily., Disp: 30 tablet, Rfl: 2 .  aspirin 81 MG tablet, Take 81 mg by mouth daily., Disp: , Rfl:  .  colchicine 0.6 MG tablet, Take 1 tablet (0.6 mg total) by mouth daily., Disp: 5 tablet, Rfl: 0 .  metFORMIN (GLUCOPHAGE) 500 MG tablet, TAKE 1 TABLET (500 MG TOTAL) BY MOUTH 2  (TWO) TIMES DAILY WITH A MEAL., Disp: 180 tablet, Rfl: 0 .  metoprolol tartrate (LOPRESSOR) 25 MG tablet, TAKE 1/2 TABLETS 2 TIMES DAILY BY MOUTH., Disp: 90 tablet, Rfl: 1 .  montelukast (SINGULAIR) 10 MG tablet, TAKE 1 TABLET (10 MG TOTAL) AT BEDTIME BY MOUTH., Disp: 90 tablet, Rfl: 1 .  MULTIPLE VITAMIN PO, Take 1 tablet by mouth daily. , Disp: , Rfl:  .  naproxen (NAPROSYN) 500 MG tablet, TAKE 1 TABLET BY MOUTH TWICE A DAY WITH A MEAL, Disp: 60 tablet, Rfl: 0 .  nitroGLYCERIN (NITROSTAT) 0.4 MG SL tablet, Place 1 tablet (0.4 mg total) under the tongue every 5 (five) minutes as needed., Disp: 30 tablet, Rfl: 0 .  simvastatin (ZOCOR) 40 MG tablet, TAKE 1 TABLET AT BEDTIME BY MOUTH., Disp: 90 tablet, Rfl: 1 .  traMADol (ULTRAM) 50 MG tablet, Take 1 tablet (50 mg total) by mouth every 8 (eight) hours as needed., Disp: 15 tablet, Rfl: 0  Allergies  Allergen Reactions  . Atorvastatin     REACTION: myalgias  and liver         Objective:  Physical Exam  General: AAO x3, NAD  Dermatological: On the medial aspect of the bunion site there is what appears to be superficial healed area and what she describes she  gets the clear, white drainage from but I am unable to express this today it appears as she is starting to heal over.  There is localized edema and erythema to the medial first metatarsal head there is tenderness palpation of this area.  There is discomfort with MPJ range of motion as well.  Vascular: Dorsalis Pedis artery and Posterior Tibial artery pedal pulses are 2/4 bilateral with immedate capillary fill time.  There is no pain with calf compression, swelling, warmth, erythema.   Neruologic: Grossly intact via light touch bilateral.  Protective threshold with Semmes Wienstein monofilament intact to all pedal sites bilateral.   Musculoskeletal: Tenderness the left first MPJ.  No other areas of tenderness.  Muscular strength 5/5 in all groups tested bilateral.  Gait: Unassisted,  Nonantalgic.       Assessment:   Gout likely left foot     Plan:  -Treatment options discussed including all alternatives, risks, and complications -Etiology of symptoms were discussed -X-rays were obtained and reviewed with the patient.  Radiolucency present in the medial aspect the first metatarsal head.  This is consistent with gout versus early osteomyelitis.  I significantly reviewed the MRI that she had done previously.  This is been ongoing issue the last couple weeks.  She states that she has had gout previously but not to this extent.  The MRI findings although slightly gallop concern for possible osteomyelitis given her ongoing symptoms we discussed an incision and drainage of the area and biopsy of the bone.  At this point she wishes to proceed with this.  She understood this is not going to prevent also helping in the future.  However given her MRI findings and consistent pain and redness and drainage coming from the area I do think a biopsy and excision drainage would be beneficial for her.  Continue allopurinol for now.  Also did try a week of colchicine which I prescribed today. -The incision placement as well as the postoperative course was discussed with the patient. I discussed risks of the surgery which include, but not limited to, infection, bleeding, pain, swelling, need for further surgery, delayed or nonhealing, painful or ugly scar, numbness or sensation changes, over/under correction, recurrence, transfer lesions, further deformity, hardware failure, DVT/PE, loss of toe/foot. Patient understands these risks and wishes to proceed with surgery. The surgical consent was reviewed with the patient all 3 pages were signed. No promises or guarantees were given to the outcome of the procedure. All questions were answered to the best of my ability. Before the surgery the patient was encouraged to call the office if there is any further questions. The surgery will be performed at the [facility]  on an outpatient basis.  Trula Slade DPM

## 2018-03-02 ENCOUNTER — Encounter: Payer: Self-pay | Admitting: Podiatry

## 2018-03-02 ENCOUNTER — Other Ambulatory Visit: Payer: Self-pay | Admitting: Podiatry

## 2018-03-02 DIAGNOSIS — M10072 Idiopathic gout, left ankle and foot: Secondary | ICD-10-CM | POA: Diagnosis not present

## 2018-03-02 DIAGNOSIS — M109 Gout, unspecified: Secondary | ICD-10-CM | POA: Diagnosis not present

## 2018-03-02 DIAGNOSIS — L02612 Cutaneous abscess of left foot: Secondary | ICD-10-CM | POA: Diagnosis not present

## 2018-03-02 DIAGNOSIS — M1A0721 Idiopathic chronic gout, left ankle and foot, with tophus (tophi): Secondary | ICD-10-CM | POA: Diagnosis not present

## 2018-03-02 DIAGNOSIS — E7800 Pure hypercholesterolemia, unspecified: Secondary | ICD-10-CM | POA: Diagnosis not present

## 2018-03-02 DIAGNOSIS — M79672 Pain in left foot: Secondary | ICD-10-CM | POA: Diagnosis not present

## 2018-03-02 DIAGNOSIS — M86172 Other acute osteomyelitis, left ankle and foot: Secondary | ICD-10-CM | POA: Diagnosis not present

## 2018-03-02 DIAGNOSIS — M1000 Idiopathic gout, unspecified site: Secondary | ICD-10-CM | POA: Diagnosis not present

## 2018-03-02 MED ORDER — HYDROCODONE-ACETAMINOPHEN 5-325 MG PO TABS: 1.0000 | ORAL_TABLET | Freq: Four times a day (QID) | ORAL | 0 refills | Status: DC | PRN

## 2018-03-02 MED ORDER — PROMETHAZINE HCL 25 MG PO TABS: 25.0000 mg | ORAL_TABLET | Freq: Three times a day (TID) | ORAL | 0 refills | Status: DC | PRN

## 2018-03-02 MED ORDER — CEPHALEXIN 500 MG PO CAPS: 500.0000 mg | ORAL_CAPSULE | Freq: Three times a day (TID) | ORAL | 0 refills | Status: DC

## 2018-03-02 NOTE — Progress Notes (Signed)
Postop medications sent to her pharmacy

## 2018-03-03 ENCOUNTER — Telehealth: Payer: Self-pay | Admitting: *Deleted

## 2018-03-03 NOTE — Telephone Encounter (Signed)
Called and spoke with patient and the patient stated that she was doing good and keeping her foot elevated and icing as needed as well as taken the pain medicine as needed as well and patient stated she was not having any chills or fever or nausea and I stated to the patient to call the office if needed and that we would see patient Thursday. Lattie Haw

## 2018-03-05 ENCOUNTER — Ambulatory Visit (INDEPENDENT_AMBULATORY_CARE_PROVIDER_SITE_OTHER): Payer: Medicare Other | Admitting: Podiatry

## 2018-03-05 ENCOUNTER — Other Ambulatory Visit: Payer: Self-pay | Admitting: Podiatry

## 2018-03-05 ENCOUNTER — Encounter: Payer: Self-pay | Admitting: Podiatry

## 2018-03-05 ENCOUNTER — Ambulatory Visit (INDEPENDENT_AMBULATORY_CARE_PROVIDER_SITE_OTHER): Payer: Medicare Other

## 2018-03-05 DIAGNOSIS — M868X7 Other osteomyelitis, ankle and foot: Secondary | ICD-10-CM | POA: Diagnosis not present

## 2018-03-05 DIAGNOSIS — L02612 Cutaneous abscess of left foot: Secondary | ICD-10-CM

## 2018-03-05 DIAGNOSIS — M79672 Pain in left foot: Secondary | ICD-10-CM

## 2018-03-05 MED ORDER — CEPHALEXIN 500 MG PO CAPS: 500.0000 mg | ORAL_CAPSULE | Freq: Three times a day (TID) | ORAL | 0 refills | Status: DC

## 2018-03-05 NOTE — Progress Notes (Signed)
Subjective:  Patient ID: Rebecca Mcintyre, female    DOB: 1946/02/13,  MRN: [id number]  Chief Complaint  Patient presents with  . Routine Post Op    I&DLT Bone Biopsy LT dos 10.7.19 Pt states healing well and no complaints. Some redness/swelling. Hydrocodone ineffective, tylenol works better. Pt got bandage wet this morning.    DOS: 03/02/18 Procedure: Incision and drainage, bone biopsy left foot (Dr. Jacqualyn Posey)  72 y.o. female returns for post-op check.  Above history confirmed with patient.  Reports some redness and swelling  Review of Systems: Negative except as noted in the HPI. Denies N/V/F/Ch.  Past Medical History:  Diagnosis Date  . Allergy   . ASCUS on Pap smear    had repeat x 2 normal with [name] Plan 2011  . Gout   . Hyperlipidemia   . Hypertension   . Myocardial infarction [facility])    2002 95% proximal LAD stenosis with thrombus followed by 90% stenosis, the first diagonal 80% stenosis, circumflex 30% stenosis, circumflex obtuse marginal subbranch 70% stenosis, PDA 40% stenosis. She had a drug-eluting stent placement with a Pixel stent  . Osteopenia   . Solitary kidney     Current Outpatient Medications:  .  allopurinol (ZYLOPRIM) 100 MG tablet, Take 1 tablet (100 mg total) by mouth daily., Disp: 30 tablet, Rfl: 2 .  aspirin 81 MG tablet, Take 81 mg by mouth daily., Disp: , Rfl:  .  cephALEXin (KEFLEX) 500 MG capsule, Take 1 capsule (500 mg total) by mouth 3 (three) times daily., Disp: 21 capsule, Rfl: 0 .  colchicine 0.6 MG tablet, Take 1 tablet (0.6 mg total) by mouth daily., Disp: 5 tablet, Rfl: 0 .  HYDROcodone-acetaminophen (NORCO/VICODIN) 5-325 MG tablet, Take 1 tablet by mouth every 6 (six) hours as needed., Disp: 15 tablet, Rfl: 0 .  metFORMIN (GLUCOPHAGE) 500 MG tablet, TAKE 1 TABLET (500 MG TOTAL) BY MOUTH 2 (TWO) TIMES DAILY WITH A MEAL., Disp: 180 tablet, Rfl: 0 .  metoprolol tartrate (LOPRESSOR) 25 MG tablet, TAKE 1/2 TABLETS 2 TIMES DAILY BY MOUTH., Disp: 90  tablet, Rfl: 1 .  montelukast (SINGULAIR) 10 MG tablet, TAKE 1 TABLET (10 MG TOTAL) AT BEDTIME BY MOUTH., Disp: 90 tablet, Rfl: 1 .  MULTIPLE VITAMIN PO, Take 1 tablet by mouth daily. , Disp: , Rfl:  .  naproxen (NAPROSYN) 500 MG tablet, TAKE 1 TABLET BY MOUTH TWICE A DAY WITH A MEAL, Disp: 60 tablet, Rfl: 0 .  nitroGLYCERIN (NITROSTAT) 0.4 MG SL tablet, Place 1 tablet (0.4 mg total) under the tongue every 5 (five) minutes as needed., Disp: 30 tablet, Rfl: 0 .  promethazine (PHENERGAN) 25 MG tablet, Take 1 tablet (25 mg total) by mouth every 8 (eight) hours as needed for nausea or vomiting., Disp: 20 tablet, Rfl: 0 .  simvastatin (ZOCOR) 40 MG tablet, TAKE 1 TABLET AT BEDTIME BY MOUTH., Disp: 90 tablet, Rfl: 1 .  traMADol (ULTRAM) 50 MG tablet, Take 1 tablet (50 mg total) by mouth every 8 (eight) hours as needed., Disp: 15 tablet, Rfl: 0  Social History   Tobacco Use  Smoking Status Former Smoker  . Types: Cigarettes  Smokeless Tobacco Never Used  Tobacco Comment   37 years    Allergies  Allergen Reactions  . Atorvastatin     REACTION: myalgias  and liver   Objective:  There were no vitals filed for this visit. There is no height or weight on file to calculate BMI. Constitutional Well developed. Well nourished.  Vascular Foot warm and well perfused. Capillary refill normal to all digits.   Neurologic Normal speech. Oriented to person, place, and time. Epicritic sensation to light touch grossly present bilaterally.  Dermatologic Skin healing well without signs of infection. Skin edges well coapted without signs of infection.  Slight periwound erythema noted   Orthopedic: Tenderness to palpation noted about the surgical site.   Radiographs: Taken and reviewed no acute fracture dislocation.  No subcu emphysema.  No osseous erosions. Assessment:   1. Abscess of left foot    Plan:  Patient was evaluated and treated and all questions answered.  S/p foot surgery  left -Progressing as expected post-operatively. -XR: As above -WB Status: Weight-bear as tolerated in surgical shoe -Sutures: Left intact. -Medications: Refilled antibiotic prescription as there is still some slight redness and I am not sure if this is resolving or worsening compared to prior. -Foot redressed. -Checked chart for path no report appears to be back yet.  Return in about 1 week (around 03/12/2018) for Post-op.

## 2018-03-06 NOTE — Telephone Encounter (Signed)
Pt informed through return MyChart message.

## 2018-03-06 NOTE — Telephone Encounter (Signed)
-----  Message from Evelina Bucy, DPM sent at 03/05/2018  9:09 PM EDT ----- Can we call and let patient know I sent her abx refill to her pharmacy. I didn't send it immediately after her visit.

## 2018-03-10 ENCOUNTER — Other Ambulatory Visit (INDEPENDENT_AMBULATORY_CARE_PROVIDER_SITE_OTHER): Payer: Medicare Other

## 2018-03-10 DIAGNOSIS — E785 Hyperlipidemia, unspecified: Secondary | ICD-10-CM

## 2018-03-10 DIAGNOSIS — M109 Gout, unspecified: Secondary | ICD-10-CM

## 2018-03-10 DIAGNOSIS — D582 Other hemoglobinopathies: Secondary | ICD-10-CM | POA: Diagnosis not present

## 2018-03-10 DIAGNOSIS — R7303 Prediabetes: Secondary | ICD-10-CM

## 2018-03-10 DIAGNOSIS — I10 Essential (primary) hypertension: Secondary | ICD-10-CM

## 2018-03-10 LAB — CBC WITH DIFFERENTIAL/PLATELET
BASOS PCT: 0.7 % (ref 0.0–3.0)
Basophils Absolute: 0 10*3/uL (ref 0.0–0.1)
EOS PCT: 2.3 % (ref 0.0–5.0)
Eosinophils Absolute: 0.2 10*3/uL (ref 0.0–0.7)
HCT: 47.6 % — ABNORMAL HIGH (ref 36.0–46.0)
HEMOGLOBIN: 15.9 g/dL — AB (ref 12.0–15.0)
Lymphocytes Relative: 33.7 % (ref 12.0–46.0)
Lymphs Abs: 2.4 10*3/uL (ref 0.7–4.0)
MCHC: 33.3 g/dL (ref 30.0–36.0)
MCV: 92.2 fl (ref 78.0–100.0)
MONO ABS: 0.7 10*3/uL (ref 0.1–1.0)
Monocytes Relative: 9.8 % (ref 3.0–12.0)
Neutro Abs: 3.8 10*3/uL (ref 1.4–7.7)
Neutrophils Relative %: 53.5 % (ref 43.0–77.0)
Platelets: 253 10*3/uL (ref 150.0–400.0)
RBC: 5.16 Mil/uL — ABNORMAL HIGH (ref 3.87–5.11)
RDW: 14 % (ref 11.5–15.5)
WBC: 7.1 10*3/uL (ref 4.0–10.5)

## 2018-03-10 LAB — BASIC METABOLIC PANEL
BUN: 21 mg/dL (ref 6–23)
CALCIUM: 9.1 mg/dL (ref 8.4–10.5)
CO2: 33 mEq/L — ABNORMAL HIGH (ref 19–32)
Chloride: 102 mEq/L (ref 96–112)
Creatinine, Ser: 0.73 mg/dL (ref 0.40–1.20)
GFR: 83.43 mL/min (ref >60.00)
GLUCOSE: 103 mg/dL — AB (ref 70–99)
Potassium: 4.3 mEq/L (ref 3.5–5.1)
SODIUM: 142 meq/L (ref 135–145)

## 2018-03-10 LAB — LIPID PANEL
CHOLESTEROL: 142 mg/dL (ref 0–200)
HDL: 40.6 mg/dL (ref >39.00)
LDL CALC: 74 mg/dL (ref 0–99)
NonHDL: 101.38
Total CHOL/HDL Ratio: 3
Triglycerides: 137 mg/dL (ref 0.0–149.0)
VLDL: 27.4 mg/dL (ref 0.0–40.0)

## 2018-03-10 LAB — HEPATIC FUNCTION PANEL
ALT: 13 U/L (ref 0–35)
AST: 10 U/L (ref 0–37)
Albumin: 3.8 g/dL (ref 3.5–5.2)
Alkaline Phosphatase: 67 U/L (ref 39–117)
BILIRUBIN DIRECT: 0.1 mg/dL (ref 0.0–0.3)
BILIRUBIN TOTAL: 0.4 mg/dL (ref 0.2–1.2)
Total Protein: 6.4 g/dL (ref 6.0–8.3)

## 2018-03-10 LAB — TSH: TSH: 2.37 u[IU]/mL (ref 0.35–4.50)

## 2018-03-10 LAB — HEMOGLOBIN A1C: Hgb A1c MFr Bld: 6.1 % (ref 4.6–6.5)

## 2018-03-10 LAB — URIC ACID: URIC ACID, SERUM: 6.6 mg/dL (ref 2.4–7.0)

## 2018-03-13 ENCOUNTER — Ambulatory Visit (INDEPENDENT_AMBULATORY_CARE_PROVIDER_SITE_OTHER): Payer: Medicare Other | Admitting: Podiatry

## 2018-03-13 DIAGNOSIS — M1000 Idiopathic gout, unspecified site: Secondary | ICD-10-CM

## 2018-03-14 NOTE — Progress Notes (Signed)
Subjective: Rebecca Mcintyre is a 72 y.o. is seen today in office s/p left foot incision and drainage, bone biopsy preformed on 03/02/2018. They state their pain is minimal and she is not taking any pain medication.  She is wearing a surgical shoe.  She is still on antibiotics as it was refilled last appointment given still some mild redness.  Denies any systemic complaints such as fevers, chills, nausea, vomiting. No calf pain, chest pain, shortness of breath.   Objective: General: No acute distress, AAOx3  DP/PT pulses palpable 2/4, CRT < 3 sec to all digits.  Protective sensation intact. Motor function intact.  Left foot: Incision is well coapted without any evidence of dehiscence and sutures are intact.  There is some faint surrounding erythema although this is improved compared to what it was.  There is still some mild motion across the incision.  There is no fluctuation or crepitation pain.  There is trace edema. No other open lesions or pre-ulcerative lesions.  No pain with calf compression, swelling, warmth, erythema.   Assessment and Plan:  Status post left foot incision and drainage, prolapse, doing well with no complications   -Treatment options discussed including all alternatives, risks, and complications -Incision appears to be healing well however the incision is also mild motion across the incision as well as the sutures intact.  Ointment and a bandage was applied.  Keep dressing clean, dry, intact however she can start to change every other day if needed with a similar dressing. -Remain in surgical shoe. -Ice/elevation -Pain medication as needed. -Reviewed pathology with her as well which revealed gout and no evidence of osteomyelitis. -Monitor for any clinical signs or symptoms of infection and DVT/PE and directed to call the office immediately should any occur or go to the ER. -Follow-up in 1 week for suture removal or sooner if any problems arise. In the meantime, encouraged to  call the office with any questions, concerns, change in symptoms.   Celesta Gentile, DPM

## 2018-03-17 ENCOUNTER — Encounter: Payer: Medicare Other | Admitting: Internal Medicine

## 2018-03-23 ENCOUNTER — Ambulatory Visit (INDEPENDENT_AMBULATORY_CARE_PROVIDER_SITE_OTHER): Payer: Self-pay | Admitting: Podiatry

## 2018-03-23 DIAGNOSIS — M10072 Idiopathic gout, left ankle and foot: Secondary | ICD-10-CM

## 2018-03-24 NOTE — Progress Notes (Signed)
Chief Complaint  Patient presents with  . Annual Exam    Review labs.     HPI: Rebecca Mcintyre 72 y.o. comes in today for Preventive Medicare exam/ wellness visit . And Chronic disease management    Since last visit.ortho and then  Foot podiatry who helped  With eventually surgery on left toe   And saw dr wagner   .     Saw her and did surgery   t o clean out . And colcnicine used short term   Stiches out and to fu and doing better   Pathology showed gout!  Began allopurinol per dr  Tamala Julian . 100 per day and doing ok   CV: no cp  Follows cards.  Pulm "allergy " never got the pfts  When sister sick and passes ( had lung cancer )   Taking singulair   Dm no neuropathy eye    Hearing aids  Health Maintenance  Topic Date Due  . TETANUS/TDAP  09/13/2017  . INFLUENZA VACCINE  12/25/2017  . MAMMOGRAM  11/15/2019  . COLONOSCOPY  12/25/2021  . DEXA SCAN  Completed  . Hepatitis C Screening  Completed  . 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 GI /GU: No adominal pain, vomiting, change in bowel habits. No blood in the stool. No significant GU symptoms. SKIN/HEME: ,no acute skin rashes suspicious lesions or bleeding. No lymphadenopathy, nodules, masses.  NEURO/ PSYCH:  No neurologic signs such as weakness numbness. No depression anxiety. IMM/ Allergy: No unusual infections.  Allergy .   REST of 12 system review negative except as per HPI   Past Medical History:  Diagnosis Date  . Allergy   . ASCUS on Pap smear    had repeat x 2 normal with [name] Plan 2011  . Gout   . Hyperlipidemia   .  Hypertension   . Myocardial infarction [facility])    2002 95% proximal LAD stenosis with thrombus followed by 90% stenosis, the first diagonal 80% stenosis, circumflex 30% stenosis, circumflex obtuse marginal subbranch 70% stenosis, PDA 40% stenosis. She had a drug-eluting stent placement with a Pixel stent  . Osteopenia   . Solitary kidney     Family History  Problem Relation Age of Onset  . Breast cancer Mother 93  . Lung cancer Father   . Heart attack Brother        Died age 4 MI  . Colon cancer Brother   . Heart disease Daughter 41       smoker and overweight   . Esophageal cancer Neg Hx   . Pancreatic cancer Neg Hx   . Rectal cancer Neg Hx   . Stomach cancer Neg Hx     Social History   Socioeconomic History  . Marital status: Single    Spouse name: Not on file  . Number of children: 2  . Years of education: Not on file  . Highest education level: Not on file  Occupational History    Employer: [employer]  . Financial resource strain: Not on file  . Food insecurity:  Worry: Not on file    Inability: Not on file  . Transportation needs:    Medical: Not on file    Non-medical: Not on file  Tobacco Use  . Smoking status: Former Smoker    Types: Cigarettes  . Smokeless tobacco: Never Used  . Tobacco comment: 37 years  Substance and Sexual Activity  . Alcohol use: Yes    Comment: 1-2 per month  . Drug use: No  . Sexual activity: Not on file  Lifestyle  . Physical activity:    Days per week: Not on file    Minutes per session: Not on file  . Stress: Not on file  Relationships  . Social connections:    Talks on phone: Not on file    Gets together: Not on file    Attends religious service: Not on file    Active member of club or organization: Not on file    Attends meetings of clubs or organizations: Not on file    Relationship status: Not on file  Other Topics Concern  . Not on file  Social History Narrative   HH of 1    Regular  exercise-sometimes    6-7 hours sleep   Divorced   Occupation: Cabin crew working full time   Works with daughter    No pets       Outpatient Encounter Medications as of 03/25/2018  Medication Sig  . allopurinol (ZYLOPRIM) 100 MG tablet Take 1 tablet (100 mg total) by mouth daily.  [name] aspirin 81 MG tablet Take 81 mg by mouth daily.  . metFORMIN (GLUCOPHAGE) 500 MG tablet TAKE 1 TABLET (500 MG TOTAL) BY MOUTH 2 (TWO) TIMES DAILY WITH A MEAL.  . metoprolol tartrate (LOPRESSOR) 25 MG tablet TAKE 1/2 TABLETS 2 TIMES DAILY BY MOUTH.  . montelukast (SINGULAIR) 10 MG tablet TAKE 1 TABLET (10 MG TOTAL) AT BEDTIME BY MOUTH.  . MULTIPLE VITAMIN PO Take 1 tablet by mouth daily.   . nitroGLYCERIN (NITROSTAT) 0.4 MG SL tablet Place 1 tablet (0.4 mg total) under the tongue every 5 (five) minutes as needed.  . promethazine (PHENERGAN) 25 MG tablet Take 1 tablet (25 mg total) by mouth every 8 (eight) hours as needed for nausea or vomiting.  . simvastatin (ZOCOR) 40 MG tablet TAKE 1 TABLET AT BEDTIME BY MOUTH.  . traMADol (ULTRAM) 50 MG tablet Take 1 tablet (50 mg total) by mouth every 8 (eight) hours as needed.  . [DISCONTINUED] cephALEXin (KEFLEX) 500 MG capsule Take 1 capsule (500 mg total) by mouth 3 (three) times daily. (Patient not taking: Reported on 03/25/2018)  . [DISCONTINUED] colchicine 0.6 MG tablet Take 1 tablet (0.6 mg total) by mouth daily. (Patient not taking: Reported on 03/25/2018)  . [DISCONTINUED] HYDROcodone-acetaminophen (NORCO/VICODIN) 5-325 MG tablet Take 1 tablet by mouth every 6 (six) hours as needed. (Patient not taking: Reported on 03/25/2018)  . [DISCONTINUED] naproxen (NAPROSYN) 500 MG tablet TAKE 1 TABLET BY MOUTH TWICE A DAY WITH A MEAL (Patient not taking: Reported on 03/25/2018)   No facility-administered encounter medications on file as of 03/25/2018.     EXAM:  BP 122/62 (BP Location: Right Arm, Patient Position: Sitting, Cuff Size: Normal)   Pulse 87   Temp 97.6 F  (36.4 C) (Oral)   Ht 5' 3.5" (1.613 m)   Wt 199 lb 12.8 oz (90.6 kg)   BMI 34.84 kg/m   Body mass index is 34.84 kg/m.  Physical Exam: Vital signs reviewed ENI:DPOE is a well-developed  well-nourished alert cooperative   who appears stated age in no acute distress.  HEENT: normocephalic atraumatic , Eyes: PERRL EOM's full, conjunctiva clear, Nares: paten,t no deformity discharge or tenderness., Ears: no deformity EAC's clear TMs with normal landmarks. Mouth: clear OP, no lesions, edema.  Moist mucous membranes. Dentition in adequate repair. NECK: supple without masses, thyromegaly or bruits. CHEST/PULM:  Clear to auscultation and percussion breath sounds equal no wheeze , rales or rhonchi. But decrease overall  No chest wall deformities or tenderness. Breast  No nodules or masses  Some mild kyphosis  CV: PMI is nondisplaced, S1 S2 no gallops, murmurs, rubs. Peripheral pulses are full without delay.No JVD .  ABDOMEN: Bowel sounds normal nontender  No guard or rebound, no hepato splenomegal no CVA tenderness.   Extremtities:  No clubbing cyanosis or edema, no acute joint swelling or redness no focal atrophy left foot in boot  NEURO:  Oriented x3, cranial nerves 3-12 appear to be intact, no obvious focal weakness,gait within normal limits no abnormal reflexes or asymmetrical SKIN: No acute rashes normal turgor, color, no bruising or petechiae. PSYCH: Oriented, good eye contact, no obvious depression anxiety, cognition and judgment appear normal. LN: no cervical axillaryadenopathy No noted deficits in memory, attention, and speech.   Lab Results  Component Value Date   WBC 7.1 03/10/2018   HGB 15.9 (H) 03/10/2018   HCT 47.6 (H) 03/10/2018   PLT 253.0 03/10/2018   GLUCOSE 103 (H) 03/10/2018   CHOL 142 03/10/2018   TRIG 137.0 03/10/2018   HDL 40.60 03/10/2018   LDLCALC 74 03/10/2018   ALT 13 03/10/2018   AST 10 03/10/2018   NA 142 03/10/2018   K 4.3 03/10/2018   CL 102 03/10/2018    CREATININE 0.73 03/10/2018   BUN 21 03/10/2018   CO2 33 (H) 03/10/2018   TSH 2.37 03/10/2018   HGBA1C 6.1 03/10/2018   MICROALBUR 1.1 03/10/2017   Lab review ASSESSMENT AND PLAN:  Discussed the following assessment and plan:  Visit for preventive health examination  Medication management  Hyperlipidemia, unspecified hyperlipidemia type  Essential hypertension  Decreased hearing of both ears  Elevated hemoglobin (HCC)  Pre-diabetes  Need for influenza vaccination - Plan: Flu vaccine HIGH DOSE PF (Fluzone High dose)  Encounter for screening for lung cancer  History of tobacco use - Plan: Ambulatory Referral for Lung Cancer Scre  Family history of lung cancer - Plan: Ambulatory Referral for Lung Cancer Scre  Gout involving toe of left foot, unspecified cause, unspecified chronicity - sp surgery and rehab on low dose allopurinol Reviewed  Last years x ray and scan  Emphysema  On ct   Get her pfts  And lung cancer screen referral may end up with  pulm consults     Her dm and cad etc in control  Get td at pharmacy so covered  Plan fu after pfts etc  Patient Care Team: Burnis Medin, MD as PCP - General Paula Compton, MD as Attending Physician (Obstetrics and Gynecology) Irene Shipper, MD as Attending Physician (Gastroenterology) Sharyne Peach, MD as Consulting Physician (Ophthalmology) Minus Breeding, MD as Consulting Physician (Cardiology)  Patient Instructions  Labs are good.  Please  Make appt for pulmonary functions as  Planned last year   As  You ct scan showed emphysema .    consdier seeing  Lung specilsits depending on   resutls and how doing   Will do referral for lassessment for lung cancer screening.   Add flonase  daily   To the singuloar and try   otc claritin if allergies are bothering you.    Continue lifestyle intervention healthy eating and exercise . Blood sugar is good .   Plan   rov in 6 months or earlier if needed  .   Will cahnge  pharmacy to [facility] .     Preventive Care 89 Years and Older, Female Preventive care refers to lifestyle choices and visits with your health care provider that can promote health and wellness. What does preventive care include?  A yearly physical exam. This is also called an annual well check.  Dental exams once or twice a year.  Routine eye exams. Ask your health care provider how often you should have your eyes checked.  Personal lifestyle choices, including: ? Daily care of your teeth and gums. ? Regular physical activity. ? Eating a healthy diet. ? Avoiding tobacco and drug use. ? Limiting alcohol use. ? Practicing safe sex. ? Taking low-dose aspirin every day. ? Taking vitamin and mineral supplements as recommended by your health care provider. What happens during an annual well check? The services and screenings done by your health care provider during your annual well check will depend on your age, overall health, lifestyle risk factors, and family history of disease. Counseling Your health care provider may ask you questions about your:  Alcohol use.  Tobacco use.  Drug use.  Emotional well-being.  Home and relationship well-being.  Sexual activity.  Eating habits.  History of falls.  Memory and ability to understand (cognition).  Work and work Statistician.  Reproductive health.  Screening You may have the following tests or measurements:  Height, weight, and BMI.  Blood pressure.  Lipid and cholesterol levels. These may be checked every 5 years, or more frequently if you are over 93 years old.  Skin check.  Lung cancer screening. You may have this screening every year starting at age 16 if you have a 30-pack-year history of smoking and currently smoke or have quit within the past 15 years.  Fecal occult blood test (FOBT) of the stool. You may have this test every year starting at age 104.  Flexible sigmoidoscopy or colonoscopy. You may  have a sigmoidoscopy every 5 years or a colonoscopy every 10 years starting at age 3.  Hepatitis C blood test.  Hepatitis B blood test.  Sexually transmitted disease (STD) testing.  Diabetes screening. This is done by checking your blood sugar (glucose) after you have not eaten for a while (fasting). You may have this done every 1-3 years.  Bone density scan. This is done to screen for osteoporosis. You may have this done starting at age 23.  Mammogram. This may be done every 1-2 years. Talk to your health care provider about how often you should have regular mammograms.  Talk with your health care provider about your test results, treatment options, and if necessary, the need for more tests. Vaccines Your health care provider may recommend certain vaccines, such as:  Influenza vaccine. This is recommended every year.  Tetanus, diphtheria, and acellular pertussis (Tdap, Td) vaccine. You may need a Td booster every 10 years.  Varicella vaccine. You may need this if you have not been vaccinated.  Zoster vaccine. You may need this after age 6.  Measles, mumps, and rubella (MMR) vaccine. You may need at least one dose of MMR if you were born in 1957 or later. You may also need a second dose.  Pneumococcal 13-valent  conjugate (PCV13) vaccine. One dose is recommended after age 42.  Pneumococcal polysaccharide (PPSV23) vaccine. One dose is recommended after age 78.  Meningococcal vaccine. You may need this if you have certain conditions.  Hepatitis A vaccine. You may need this if you have certain conditions or if you travel or work in places where you may be exposed to hepatitis A.  Hepatitis B vaccine. You may need this if you have certain conditions or if you travel or work in places where you may be exposed to hepatitis B.  Haemophilus influenzae type b (Hib) vaccine. You may need this if you have certain conditions.  Talk to your health care provider about which screenings and  vaccines you need and how often you need them. This information is not intended to replace advice given to you by your health care provider. Make sure you discuss any questions you have with your health care provider. Document Released: 06/09/2015 Document Revised: 01/31/2016 Document Reviewed: 03/14/2015 Elsevier Interactive Patient Education  2018 [employer]. Elai Vanwyk M.D.

## 2018-03-25 ENCOUNTER — Ambulatory Visit (INDEPENDENT_AMBULATORY_CARE_PROVIDER_SITE_OTHER): Payer: Medicare Other | Admitting: Internal Medicine

## 2018-03-25 ENCOUNTER — Encounter: Payer: Self-pay | Admitting: Internal Medicine

## 2018-03-25 VITALS — BP 122/62 | HR 87 | Temp 97.6°F | Ht 63.5 in | Wt 199.8 lb

## 2018-03-25 DIAGNOSIS — I10 Essential (primary) hypertension: Secondary | ICD-10-CM | POA: Diagnosis not present

## 2018-03-25 DIAGNOSIS — E785 Hyperlipidemia, unspecified: Secondary | ICD-10-CM

## 2018-03-25 DIAGNOSIS — H9193 Unspecified hearing loss, bilateral: Secondary | ICD-10-CM | POA: Diagnosis not present

## 2018-03-25 DIAGNOSIS — Z23 Encounter for immunization: Secondary | ICD-10-CM | POA: Diagnosis not present

## 2018-03-25 DIAGNOSIS — Z79899 Other long term (current) drug therapy: Secondary | ICD-10-CM

## 2018-03-25 DIAGNOSIS — Z801 Family history of malignant neoplasm of trachea, bronchus and lung: Secondary | ICD-10-CM

## 2018-03-25 DIAGNOSIS — Z122 Encounter for screening for malignant neoplasm of respiratory organs: Secondary | ICD-10-CM

## 2018-03-25 DIAGNOSIS — Z0000 Encounter for general adult medical examination without abnormal findings: Secondary | ICD-10-CM | POA: Diagnosis not present

## 2018-03-25 DIAGNOSIS — M109 Gout, unspecified: Secondary | ICD-10-CM

## 2018-03-25 DIAGNOSIS — R7303 Prediabetes: Secondary | ICD-10-CM

## 2018-03-25 DIAGNOSIS — Z87891 Personal history of nicotine dependence: Secondary | ICD-10-CM

## 2018-03-25 DIAGNOSIS — D582 Other hemoglobinopathies: Secondary | ICD-10-CM

## 2018-03-25 NOTE — Patient Instructions (Addendum)
Labs are good.  Please  Make appt for pulmonary functions as  Planned last year   As  You ct scan showed emphysema .    consdier seeing  Lung specilsits depending on   resutls and how doing   Will do referral for lassessment for lung cancer screening.   Add flonase daily   To the singuloar and try   otc claritin if allergies are bothering you.    Continue lifestyle intervention healthy eating and exercise . Blood sugar is good .   Plan   rov in 6 months or earlier if needed  .   Will cahnge pharmacy to [facility] .     Preventive Care 29 Years and Older, Female Preventive care refers to lifestyle choices and visits with your health care provider that can promote health and wellness. What does preventive care include?  A yearly physical exam. This is also called an annual well check.  Dental exams once or twice a year.  Routine eye exams. Ask your health care provider how often you should have your eyes checked.  Personal lifestyle choices, including: ? Daily care of your teeth and gums. ? Regular physical activity. ? Eating a healthy diet. ? Avoiding tobacco and drug use. ? Limiting alcohol use. ? Practicing safe sex. ? Taking low-dose aspirin every day. ? Taking vitamin and mineral supplements as recommended by your health care provider. What happens during an annual well check? The services and screenings done by your health care provider during your annual well check will depend on your age, overall health, lifestyle risk factors, and family history of disease. Counseling Your health care provider may ask you questions about your:  Alcohol use.  Tobacco use.  Drug use.  Emotional well-being.  Home and relationship well-being.  Sexual activity.  Eating habits.  History of falls.  Memory and ability to understand (cognition).  Work and work Statistician.  Reproductive health.  Screening You may have the following tests or measurements:  Height,  weight, and BMI.  Blood pressure.  Lipid and cholesterol levels. These may be checked every 5 years, or more frequently if you are over 43 years old.  Skin check.  Lung cancer screening. You may have this screening every year starting at age 48 if you have a 30-pack-year history of smoking and currently smoke or have quit within the past 15 years.  Fecal occult blood test (FOBT) of the stool. You may have this test every year starting at age 88.  Flexible sigmoidoscopy or colonoscopy. You may have a sigmoidoscopy every 5 years or a colonoscopy every 10 years starting at age 59.  Hepatitis C blood test.  Hepatitis B blood test.  Sexually transmitted disease (STD) testing.  Diabetes screening. This is done by checking your blood sugar (glucose) after you have not eaten for a while (fasting). You may have this done every 1-3 years.  Bone density scan. This is done to screen for osteoporosis. You may have this done starting at age 4.  Mammogram. This may be done every 1-2 years. Talk to your health care provider about how often you should have regular mammograms.  Talk with your health care provider about your test results, treatment options, and if necessary, the need for more tests. Vaccines Your health care provider may recommend certain vaccines, such as:  Influenza vaccine. This is recommended every year.  Tetanus, diphtheria, and acellular pertussis (Tdap, Td) vaccine. You may need a Td booster every 10 years.  Varicella vaccine. You may need this if you have not been vaccinated.  Zoster vaccine. You may need this after age 16.  Measles, mumps, and rubella (MMR) vaccine. You may need at least one dose of MMR if you were born in 1957 or later. You may also need a second dose.  Pneumococcal 13-valent conjugate (PCV13) vaccine. One dose is recommended after age 39.  Pneumococcal polysaccharide (PPSV23) vaccine. One dose is recommended after age 68.  Meningococcal vaccine.  You may need this if you have certain conditions.  Hepatitis A vaccine. You may need this if you have certain conditions or if you travel or work in places where you may be exposed to hepatitis A.  Hepatitis B vaccine. You may need this if you have certain conditions or if you travel or work in places where you may be exposed to hepatitis B.  Haemophilus influenzae type b (Hib) vaccine. You may need this if you have certain conditions.  Talk to your health care provider about which screenings and vaccines you need and how often you need them. This information is not intended to replace advice given to you by your health care provider. Make sure you discuss any questions you have with your health care provider. Document Released: 06/09/2015 Document Revised: 01/31/2016 Document Reviewed: 03/14/2015 Elsevier Interactive Patient Education  [employer].

## 2018-03-29 NOTE — Progress Notes (Signed)
Subjective: Rebecca Mcintyre is a 72 y.o. is seen today in office s/p left foot incision and drainage, bone biopsy preformed on 03/02/2018.  She states that she is doing great she feels "100% better than before surgery".  She presents today for suture removal.  She is been wearing a surgical shoe.  She has no other concerns today. Denies any systemic complaints such as fevers, chills, nausea, vomiting. No calf pain, chest pain, shortness of breath.   Objective: General: No acute distress, AAOx3  DP/PT pulses palpable 2/4, CRT < 3 sec to all digits.  Protective sensation intact. Motor function intact.  Left foot: Incision is well coapted without any evidence of dehiscence and sutures are intact.  There is very small minimal surrounding erythema but this is improving.  She had erythema going into surgery from gout and overall this is getting much better.  There is no increased warmth there is no drainage or pus I do not think this is infection.  She has finished a course of antibiotics as well.  I removed the sutures today the incision remains well-healed and there is no opening.  There is no tenderness palpation the surgical site. No other open lesions or pre-ulcerative lesions.  No pain with calf compression, swelling, warmth, erythema.   Assessment and Plan:  Status post left foot incision and drainage, prolapse, doing well with no complications   -Treatment options discussed including all alternatives, risks, and complications -Sutures removed without complications after removal incision remained well coapted.  Antibiotic ointment and a bandage was applied as well as Steri-Strips.  She can continue with similar dressings at home she can start to shower tomorrow.  Continue the surgical shoe for now but as she starts to feel better she can start to progress into regular shoe with offloading pads.  Monitor for any incision issues or any infections let me know immediately should any issues occur.  Otherwise  I will see her back next 4 weeks or sooner if any issues are to arise for final check. -Continue allopurinol but would recommend beginning her uric acid level rechecked in about a month.  Trula Slade DPM

## 2018-04-02 ENCOUNTER — Other Ambulatory Visit: Payer: Self-pay | Admitting: Internal Medicine

## 2018-04-06 ENCOUNTER — Ambulatory Visit (INDEPENDENT_AMBULATORY_CARE_PROVIDER_SITE_OTHER): Payer: Medicare Other | Admitting: Podiatry

## 2018-04-06 DIAGNOSIS — M10072 Idiopathic gout, left ankle and foot: Secondary | ICD-10-CM

## 2018-04-06 MED ORDER — DICLOFENAC SODIUM 1 % TD GEL: 2.0000 g | Freq: Four times a day (QID) | TRANSDERMAL | 2 refills | Status: DC

## 2018-04-07 ENCOUNTER — Other Ambulatory Visit: Payer: Self-pay

## 2018-04-08 NOTE — Telephone Encounter (Signed)
Ok to rx  allopurinol 100 mg per day disp 90 refill x 1

## 2018-04-08 NOTE — Progress Notes (Signed)
Subjective: Rebecca Mcintyre is a 72 y.o. is seen today in office s/p left foot incision and drainage, bone biopsy preformed on 03/02/2018.  She states that she is doing well however she is having some issues getting back into a closed in shoe.  The incision is healed very nicely.  Her only concern is been able to wear a regular shoe.  Due to this she is remained in the surgical shoe.  She is still taking allopurinol 100 mg daily for gout.  She has no other concerns. Denies any systemic complaints such as fevers, chills, nausea, vomiting. No calf pain, chest pain, shortness of breath.   Objective: General: No acute distress, AAOx3  DP/PT pulses palpable 2/4, CRT < 3 sec to all digits.  Protective sensation intact. Motor function intact.  Left foot: Incision is well coapted without any evidence of dehiscence and a scar is well formed.  Mild edema to this area and some faint erythema more from inflammation as opposed to infection as there is no increase in warmth.  There is no drainage or pus there is no ascending sialitis.  No pain with MPJ range of motion.   No other open lesions or pre-ulcerative lesions.  No pain with calf compression, swelling, warmth, erythema.   Assessment and Plan:  Status post left foot incision and drainage, bone biopsy, doing well with no complications   -Treatment options discussed including all alternatives, risks, and complications -Incisions healing well.  However due to mild swelling and discomfort she is not able to a regular shoe.  Dispensed a bunion offloading pad for her to wear today as she starts wear shoe.  Discussed wearing a shoe with a wider toe box and a softer material to help take pressure off the area.  The next couple weeks if symptoms continue we will do steroid injection.  Continue allopurinol as prescribed by her primary care physician she can be checked again in February on her uric acid level. -Follow-up as scheduled or sooner if needed.  Trula Slade DPM

## 2018-04-08 NOTE — Telephone Encounter (Signed)
Dr Regis Bill are you okay with taking over prescribing? allopurinol (ZYLOPRIM) 100 MG tablet Sig: Take 1 tablet (100 mg total) by mouth daily.

## 2018-04-09 MED ORDER — ALLOPURINOL 100 MG PO TABS: 100.0000 mg | ORAL_TABLET | Freq: Every day | ORAL | 1 refills | Status: DC

## 2018-04-22 ENCOUNTER — Other Ambulatory Visit: Payer: Self-pay | Admitting: Acute Care

## 2018-04-22 DIAGNOSIS — Z87891 Personal history of nicotine dependence: Secondary | ICD-10-CM

## 2018-04-22 DIAGNOSIS — Z122 Encounter for screening for malignant neoplasm of respiratory organs: Secondary | ICD-10-CM

## 2018-04-27 ENCOUNTER — Ambulatory Visit (INDEPENDENT_AMBULATORY_CARE_PROVIDER_SITE_OTHER): Payer: Medicare Other

## 2018-04-27 ENCOUNTER — Ambulatory Visit (INDEPENDENT_AMBULATORY_CARE_PROVIDER_SITE_OTHER): Payer: Self-pay | Admitting: Podiatry

## 2018-04-27 DIAGNOSIS — Z9889 Other specified postprocedural states: Secondary | ICD-10-CM

## 2018-04-27 DIAGNOSIS — M10072 Idiopathic gout, left ankle and foot: Secondary | ICD-10-CM

## 2018-04-28 DIAGNOSIS — E119 Type 2 diabetes mellitus without complications: Secondary | ICD-10-CM | POA: Diagnosis not present

## 2018-04-28 LAB — HM DIABETES EYE EXAM

## 2018-04-28 NOTE — Progress Notes (Signed)
Subjective: Rebecca Mcintyre is a 72 y.o. is seen today in office s/p left foot incision and drainage, bone biopsy preformed on 03/02/2018.  She has been using the Voltaren gel and this has been helping a lot. She is wearing more closed in shoes. She is having only very minimal discomfort to the surgical/gout site. Swelling has improved.  Denies any systemic complaints such as fevers, chills, nausea, vomiting. No calf pain, chest pain, shortness of breath.   Objective: General: No acute distress, AAOx3  DP/PT pulses palpable 2/4, CRT < 3 sec to all digits.  Protective sensation intact. Motor function intact.  Left foot: Incision is well coapted without any evidence of dehiscence and a scar is well formed. Minimal edema to the area and overall the erythema is much improved. There is no pain or crepitation with ROM of the 1st MTPJ. No other areas of tenderness. No acute evidence of gout or infection.   No other open lesions or pre-ulcerative lesions.  No pain with calf compression, swelling, warmth, erythema.   Assessment and Plan:  Status post left foot incision and drainage, bone biopsy, doing well with no complications   -Treatment options discussed including all alternatives, risks, and complications -X-rays were obtained and reviewed with the patient. No evidence of acute fracture, osteomyelitis, arthritic changes to the 1st MTPJ.  -Incisions healing well.  Her pain is much improved. She is wearing closed in shoes. She is very happy with the result. I am going to discharge her from the post-op care but encouraged to call with any questions or concerns or any changes. She is going to have her uric acid rechecked by her PCP in February. Continued current dose of allopurinol.  Trula Slade DPM

## 2018-06-09 ENCOUNTER — Telehealth: Payer: Self-pay | Admitting: Acute Care

## 2018-06-09 NOTE — Telephone Encounter (Signed)
Ok will route to [place] to make her aware. I skyped Stacy at [place] [facility] to make her aware.

## 2018-06-10 ENCOUNTER — Inpatient Hospital Stay: Admission: RE | Admit: 2018-06-10 | Payer: Medicare Other | Source: Ambulatory Visit

## 2018-06-10 ENCOUNTER — Encounter: Payer: Medicare Other | Admitting: Acute Care

## 2018-06-12 ENCOUNTER — Encounter: Payer: Self-pay | Admitting: Internal Medicine

## 2018-06-12 NOTE — Telephone Encounter (Signed)
LMTC x 1 to reschedule SD[facility] and CT

## 2018-06-16 NOTE — Telephone Encounter (Signed)
Will route to [place].

## 2018-06-16 NOTE — Telephone Encounter (Signed)
Will close this message and refer to referral notes.

## 2018-06-30 ENCOUNTER — Other Ambulatory Visit: Payer: Self-pay | Admitting: Internal Medicine

## 2018-07-03 ENCOUNTER — Encounter: Payer: Self-pay | Admitting: Internal Medicine

## 2018-07-03 ENCOUNTER — Other Ambulatory Visit: Payer: Self-pay

## 2018-07-03 ENCOUNTER — Telehealth: Payer: Self-pay

## 2018-07-03 DIAGNOSIS — H903 Sensorineural hearing loss, bilateral: Secondary | ICD-10-CM | POA: Diagnosis not present

## 2018-07-03 DIAGNOSIS — Z0112 Encounter for hearing conservation and treatment: Secondary | ICD-10-CM

## 2018-07-03 NOTE — Telephone Encounter (Signed)
Certainly do referral  Just got this  Message  Please send by faster means if need urgent   Action .

## 2018-07-03 NOTE — Telephone Encounter (Signed)
Copied from [place] [id number]. Topic: Referral - Request for Referral >> Jul 03, 2018  9:21 AM Rebecca Mcintyre wrote: Has patient seen PCP for this complaint? Yes. Pt has an appt for yearly hearing evaluation with dr Marye Round laing at [facility]/ formally [place] [facility]. Pt has [facility] medicare complete. Pt is at their office now. Pt has an appt around 8 this morning will not see pt without the referral please fax to [phone] and phone [phone]

## 2018-08-28 ENCOUNTER — Other Ambulatory Visit: Payer: Self-pay

## 2018-08-28 ENCOUNTER — Encounter: Payer: Self-pay | Admitting: Internal Medicine

## 2018-08-28 ENCOUNTER — Ambulatory Visit (INDEPENDENT_AMBULATORY_CARE_PROVIDER_SITE_OTHER): Payer: Medicare Other | Admitting: Internal Medicine

## 2018-08-28 DIAGNOSIS — R6883 Chills (without fever): Secondary | ICD-10-CM | POA: Diagnosis not present

## 2018-08-28 DIAGNOSIS — H9201 Otalgia, right ear: Secondary | ICD-10-CM | POA: Diagnosis not present

## 2018-08-28 DIAGNOSIS — J22 Unspecified acute lower respiratory infection: Secondary | ICD-10-CM

## 2018-08-28 DIAGNOSIS — J019 Acute sinusitis, unspecified: Secondary | ICD-10-CM

## 2018-08-28 MED ORDER — AMOXICILLIN-POT CLAVULANATE 875-125 MG PO TABS: 1.0000 | ORAL_TABLET | Freq: Two times a day (BID) | ORAL | 0 refills | Status: DC

## 2018-08-28 NOTE — Progress Notes (Signed)
Virtual Visit via Video Note  I connected with@ on 08/28/18 at  2:00 PM EDT by a video enabled telemedicine application and verified that I am speaking with the correct person using two identifiers. Location patient: home Location provider:work or home office Persons participating in the virtual visit: patient, provider  [place] national recommendations  regarding COVID 19 pandemic   video visit is advised over in office visit for this patient.  Discussed the limitations of evaluation and management by telemedicine and  availability of in person appointments. The patient expressed understanding and agreed to proceed.   HPI: STEPHENIA Mcintyre is seen by virtual visit today because she has had worsening sinus symptoms over the last 3 to 4 days.  She has had some sinus congestion for a few weeks because this is her season however she has now developed recently pain in her right ear that is not relieved by decongestant physical measures face tenderness a minor cough but clear phlegm.  She has had no fever taking her temperature but she has had chills that are of concern to her. She has not been exposed to COVID-19 that she is aware of is been staying home and no recent travel.  Her blood pressures been okay. No shortness of breath over baseline hemoptysis.   ? Dec hearing although has  hearing deficit anyway  ROS: See pertinent positives and negatives per HPI.  Past Medical History:  Diagnosis Date  . Allergy   . ASCUS on Pap smear    had repeat x 2 normal with [name] Plan 2011  . Gout   . Hyperlipidemia   . Hypertension   . Myocardial infarction [facility])    2002 95% proximal LAD stenosis with thrombus followed by 90% stenosis, the first diagonal 80% stenosis, circumflex 30% stenosis, circumflex obtuse marginal subbranch 70% stenosis, PDA 40% stenosis. She had a drug-eluting stent placement with a Pixel stent  . Osteopenia   . Solitary kidney     Past Surgical History:  Procedure Laterality  Date  . [place]  2011  . LAD Stent angioplasty  2002  . Right oophorectomy with salpingectomy     benign growth Dr. Joneen Caraway    Family History  Problem Relation Age of Onset  . Breast cancer Mother 31  . Lung cancer Father   . Heart attack Brother        Died age 31 MI  . Colon cancer Brother   . Heart disease Daughter 6       smoker and overweight   . Esophageal cancer Neg Hx   . Pancreatic cancer Neg Hx   . Rectal cancer Neg Hx   . Stomach cancer Neg Hx     SOCIAL HX:    Current Outpatient Medications:  .  allopurinol (ZYLOPRIM) 100 MG tablet, Take 1 tablet (100 mg total) by mouth daily., Disp: 30 tablet, Rfl: 2 .  allopurinol (ZYLOPRIM) 100 MG tablet, Take 1 tablet (100 mg total) by mouth daily., Disp: 90 tablet, Rfl: 1 .  amoxicillin-clavulanate (AUGMENTIN) 875-125 MG tablet, Take 1 tablet by mouth every 12 (twelve) hours. For sinusiits, Disp: 14 tablet, Rfl: 0 .  aspirin 81 MG tablet, Take 81 mg by mouth daily., Disp: , Rfl:  .  diclofenac sodium (VOLTAREN) 1 % GEL, Apply 2 g topically 4 (four) times daily. Rub into affected area of foot 2 to 4 times daily, Disp: 100 g, Rfl: 2 .  metFORMIN (GLUCOPHAGE) 500 MG tablet, Take 1 tablet (500 mg  total) by mouth 2 (two) times daily with a meal., Disp: 180 tablet, Rfl: 1 .  metoprolol tartrate (LOPRESSOR) 25 MG tablet, TAKE 1/2 TABLETS 2 TIMES DAILY BY MOUTH., Disp: 90 tablet, Rfl: 1 .  montelukast (SINGULAIR) 10 MG tablet, TAKE 1 TABLET (10 MG TOTAL) AT BEDTIME BY MOUTH., Disp: 90 tablet, Rfl: 0 .  MULTIPLE VITAMIN PO, Take 1 tablet by mouth daily. , Disp: , Rfl:  .  nitroGLYCERIN (NITROSTAT) 0.4 MG SL tablet, Place 1 tablet (0.4 mg total) under the tongue every 5 (five) minutes as needed., Disp: 30 tablet, Rfl: 0 .  promethazine (PHENERGAN) 25 MG tablet, Take 1 tablet (25 mg total) by mouth every 8 (eight) hours as needed for nausea or vomiting., Disp: 20 tablet, Rfl: 0 .  simvastatin (ZOCOR) 40 MG tablet, TAKE 1 TABLET  AT BEDTIME BY MOUTH., Disp: 90 tablet, Rfl: 1 .  traMADol (ULTRAM) 50 MG tablet, Take 1 tablet (50 mg total) by mouth every 8 (eight) hours as needed., Disp: 15 tablet, Rfl: 0  EXAM:  VITALS per patient if applicable:non toxic looks congested  GENERAL: alert, oriented, appears well and in no acute distress looks very congested and periorbital puffiness   But nl speech  And no cough during  Visits  HEENT: atraumatic, conjunttiva clear, no obvious abnormalities on inspection of external nose and ears cheeks  and frontal tender to touch at exa  NECK: normal movements of the head and neck LUNGS: on inspection no signs of respiratory distress, breathing rate appears normal, no obvious gross SOB, gasping or wheezing  CV: no obvious cyanosis PSYCH/NEURO: pleasant and cooperative, no obvious depression or anxiety, speech and thought processing grossly intact  ASSESSMENT AND PLAN:  Discussed the following assessment and plan:  Acute sinusitis, recurrence not specified, unspecified location  Otalgia of right ear  Chills without fever  Acute respiratory infection Most likely secondary sinusitis Chills are of concern but she has no other symptoms of hypoxia or respiratory distress over baseline.  She is high risk.  No exposures.  No travel. Treatment for secondary complicated bacterial sinusitis with Augmentin 1 twice a day for 7 days continue nasal hygiene discussed alarm features to call back or seek emergent care over the weekend.  Expect improvement within 3- 4 days. Contact us if not as expected.  Expectant management and discussion of plan and treatment with patient with opportunity to ask questions and all were answered. The patient agreed with the plan and demonstrated an understanding of the instructions.   The patient was advised to call back or seek other care  if worsening having concerns    or if the condition fails to improve as anticipated.  Shanon Ace, MD

## 2018-09-21 NOTE — Progress Notes (Signed)
Virtual Visit via Video Note  I connected with@ on 09/22/18 at  8:30 AM EDT by a video enabled telemedicine application and verified that I am speaking with the correct person using two identifiers. Location patient: home Location provider:work  office Persons participating in the virtual visit: patient, provider  [place] national recommendations  regarding COVID 19 pandemic   video visit is advised over in office visit for this patient.  Discussed the limitations of evaluation and management by telemedicine and  availability of in person appointments. The patient expressed understanding and agreed to proceed.   HPI: Rebecca Mcintyre Presents for vv   Fu  Chronic disease management but she has resp sx.  Last visit  Antibiotic  Resolved her sx for a few weeks   Ear got better   and then over  last 4-5 days has had runny nose drainage  No fever but has had chills responsive to tylenol  Still using Flonase mucin ex and Singulair   No new exposures   Family taking  Face to face and    Minimal cough no cp sob  But asks to see if inhaler would help.  No  covid risk exposures   ROS: See pertinent positives and negatives per HPI. No  Cp   Past Medical History:  Diagnosis Date  . Allergy   . ASCUS on Pap smear    had repeat x 2 normal with [name] Plan 2011  . Gout   . Hyperlipidemia   . Hypertension   . Myocardial infarction [facility])    2002 95% proximal LAD stenosis with thrombus followed by 90% stenosis, the first diagonal 80% stenosis, circumflex 30% stenosis, circumflex obtuse marginal subbranch 70% stenosis, PDA 40% stenosis. She had a drug-eluting stent placement with a Pixel stent  . Osteopenia   . Solitary kidney     Past Surgical History:  Procedure Laterality Date  . [place]  2011  . LAD Stent angioplasty  2002  . Right oophorectomy with salpingectomy     benign growth Dr. Joneen Caraway    Family History  Problem Relation Age of Onset  . Breast cancer Mother 36  .  Lung cancer Father   . Heart attack Brother        Died age 55 MI  . Colon cancer Brother   . Heart disease Daughter 61       smoker and overweight   . Esophageal cancer Neg Hx   . Pancreatic cancer Neg Hx   . Rectal cancer Neg Hx   . Stomach cancer Neg Hx     Social History   Tobacco Use  . Smoking status: Former Smoker    Types: Cigarettes  . Smokeless tobacco: Never Used  . Tobacco comment: 37 years  Substance Use Topics  . Alcohol use: Yes    Comment: 1-2 per month  . Drug use: No      Current Outpatient Medications:  .  albuterol (VENTOLIN HFA) 108 (90 Base) MCG/ACT inhaler, Inhale 2 puffs into the lungs every 6 (six) hours as needed for wheezing or shortness of breath., Disp: 1 Inhaler, Rfl: 1 .  allopurinol (ZYLOPRIM) 100 MG tablet, Take 1 tablet (100 mg total) by mouth daily., Disp: 30 tablet, Rfl: 2 .  allopurinol (ZYLOPRIM) 100 MG tablet, TAKE 1 TABLET BY MOUTH EVERY DAY, Disp: 90 tablet, Rfl: 1 .  amoxicillin-clavulanate (AUGMENTIN) 875-125 MG tablet, Take 1 tablet by mouth every 12 (twelve) hours. For sinusiits, Disp: 14 tablet, Rfl:  0 .  aspirin 81 MG tablet, Take 81 mg by mouth daily., Disp: , Rfl:  .  diclofenac sodium (VOLTAREN) 1 % GEL, Apply 2 g topically 4 (four) times daily. Rub into affected area of foot 2 to 4 times daily, Disp: 100 g, Rfl: 2 .  metFORMIN (GLUCOPHAGE) 500 MG tablet, Take 1 tablet (500 mg total) by mouth 2 (two) times daily with a meal., Disp: 180 tablet, Rfl: 1 .  metoprolol tartrate (LOPRESSOR) 25 MG tablet, TAKE 1/2 TABLETS 2 TIMES DAILY BY MOUTH., Disp: 90 tablet, Rfl: 1 .  montelukast (SINGULAIR) 10 MG tablet, TAKE 1 TABLET (10 MG TOTAL) AT BEDTIME BY MOUTH., Disp: 90 tablet, Rfl: 0 .  MULTIPLE VITAMIN PO, Take 1 tablet by mouth daily. , Disp: , Rfl:  .  nitroGLYCERIN (NITROSTAT) 0.4 MG SL tablet, Place 1 tablet (0.4 mg total) under the tongue every 5 (five) minutes as needed., Disp: 30 tablet, Rfl: 0 .  promethazine (PHENERGAN) 25 MG  tablet, Take 1 tablet (25 mg total) by mouth every 8 (eight) hours as needed for nausea or vomiting., Disp: 20 tablet, Rfl: 0 .  simvastatin (ZOCOR) 40 MG tablet, TAKE 1 TABLET AT BEDTIME BY MOUTH., Disp: 90 tablet, Rfl: 1 .  traMADol (ULTRAM) 50 MG tablet, Take 1 tablet (50 mg total) by mouth every 8 (eight) hours as needed., Disp: 15 tablet, Rfl: 0  EXAM: BP Readings from Last 3 Encounters:  03/25/18 122/62  02/24/18 113/63  01/22/18 122/76    VITALS per patient if applicable:  GENERAL: alert, oriented, appears well and in no acute distress mild congestion    HEENT: atraumatic, conjunttiva clear, no obvious abnormalities on inspection of external nose and ears  NECK: normal movements of the head and neck  LUNGS: on inspection no signs of respiratory distress, breathing rate appears normal, no obvious gross SOB, gasping or wheezing  CV: no obvious cyanosis  MS: moves all visible extremities without noticeable abnormality  PSYCH/NEURO: pleasant and cooperative, no obvious depression or anxiety, speech and thought processing grossly intact Lab Results  Component Value Date   WBC 7.1 03/10/2018   HGB 15.9 (H) 03/10/2018   HCT 47.6 (H) 03/10/2018   PLT 253.0 03/10/2018   GLUCOSE 103 (H) 03/10/2018   CHOL 142 03/10/2018   TRIG 137.0 03/10/2018   HDL 40.60 03/10/2018   LDLCALC 74 03/10/2018   ALT 13 03/10/2018   AST 10 03/10/2018   NA 142 03/10/2018   K 4.3 03/10/2018   CL 102 03/10/2018   CREATININE 0.73 03/10/2018   BUN 21 03/10/2018   CO2 33 (H) 03/10/2018   TSH 2.37 03/10/2018   HGBA1C 6.1 03/10/2018   MICROALBUR 1.1 03/10/2017    ASSESSMENT AND PLAN:  Discussed the following assessment and plan:  Upper respiratory tract infection, unspecified type - prob viral newer  dexpectant managment tand  idisc alarm sx and fu   Medication management - Plan: Hemoglobin A1c, CBC with Differential/Platelet, Erythropoietin  Pre-diabetes - Plan: Hemoglobin A1c, CBC with  Differential/Platelet, Erythropoietin  Elevated hemoglobin (HCC) - suspect secondary    plan repeat lab - Plan: Hemoglobin A1c, CBC with Differential/Platelet, Erythropoietin  Chronic sinusitis, unspecified location Plan labs when better    If not improved next week   Touch base with [place] to see if needs  Vv fu  Never got  Pulmonary eval in past  See last notes  Does not appear dyspneic   Ok to try bronchodilators  And and Claritin in case  allergic sx  Adding to sx ( most likely) Then plan cpx and lab when due ( Counseled.  consider  pulm eval as stated in past  Depending on  covid restrictions etc   Expectant management and discussion of plan and treatment with patient with opportunity to ask questions and all were answered. The patient agreed with the plan and demonstrated an understanding of the instructions.  Total visit 65mns > 50% spent counseling and coordinating care as indicated in above note and in instructions to patient .   [place]  As opposed to relapsing  With  underlying allergy  And Chronic disease management  Observation advised .     The patient was advised to call back or seek an in-person evaluation if worsening  or having concerns .   WShanon Ace MD

## 2018-09-22 ENCOUNTER — Other Ambulatory Visit: Payer: Self-pay

## 2018-09-22 ENCOUNTER — Encounter: Payer: Self-pay | Admitting: Internal Medicine

## 2018-09-22 ENCOUNTER — Ambulatory Visit (INDEPENDENT_AMBULATORY_CARE_PROVIDER_SITE_OTHER): Payer: Medicare Other | Admitting: Internal Medicine

## 2018-09-22 ENCOUNTER — Other Ambulatory Visit: Payer: Self-pay | Admitting: Internal Medicine

## 2018-09-22 DIAGNOSIS — J069 Acute upper respiratory infection, unspecified: Secondary | ICD-10-CM | POA: Diagnosis not present

## 2018-09-22 DIAGNOSIS — R7303 Prediabetes: Secondary | ICD-10-CM | POA: Diagnosis not present

## 2018-09-22 DIAGNOSIS — D582 Other hemoglobinopathies: Secondary | ICD-10-CM | POA: Diagnosis not present

## 2018-09-22 DIAGNOSIS — Z79899 Other long term (current) drug therapy: Secondary | ICD-10-CM

## 2018-09-22 DIAGNOSIS — J329 Chronic sinusitis, unspecified: Secondary | ICD-10-CM | POA: Diagnosis not present

## 2018-09-22 MED ORDER — ALBUTEROL SULFATE HFA 108 (90 BASE) MCG/ACT IN AERS: 2.0000 | INHALATION_SPRAY | Freq: Four times a day (QID) | RESPIRATORY_TRACT | 1 refills | Status: DC | PRN

## 2018-09-30 ENCOUNTER — Other Ambulatory Visit: Payer: Self-pay | Admitting: Internal Medicine

## 2018-11-03 ENCOUNTER — Other Ambulatory Visit: Payer: Self-pay

## 2018-11-03 ENCOUNTER — Other Ambulatory Visit (INDEPENDENT_AMBULATORY_CARE_PROVIDER_SITE_OTHER): Payer: Medicare Other

## 2018-11-03 DIAGNOSIS — R7303 Prediabetes: Secondary | ICD-10-CM | POA: Diagnosis not present

## 2018-11-03 DIAGNOSIS — Z79899 Other long term (current) drug therapy: Secondary | ICD-10-CM | POA: Diagnosis not present

## 2018-11-03 DIAGNOSIS — D582 Other hemoglobinopathies: Secondary | ICD-10-CM | POA: Diagnosis not present

## 2018-11-03 LAB — HEMOGLOBIN A1C: Hgb A1c MFr Bld: 7 % — ABNORMAL HIGH (ref 4.6–6.5)

## 2018-11-03 LAB — CBC WITH DIFFERENTIAL/PLATELET
Basophils Absolute: 0.1 10*3/uL (ref 0.0–0.1)
Basophils Relative: 0.7 % (ref 0.0–3.0)
Eosinophils Absolute: 0.1 10*3/uL (ref 0.0–0.7)
Eosinophils Relative: 1 % (ref 0.0–5.0)
HCT: 53.1 % — ABNORMAL HIGH (ref 36.0–46.0)
Hemoglobin: 16.9 g/dL — ABNORMAL HIGH (ref 12.0–15.0)
Lymphocytes Relative: 34 % (ref 12.0–46.0)
Lymphs Abs: 2.8 10*3/uL (ref 0.7–4.0)
MCHC: 31.8 g/dL (ref 30.0–36.0)
MCV: 87.5 fl (ref 78.0–100.0)
Monocytes Absolute: 1.1 10*3/uL — ABNORMAL HIGH (ref 0.1–1.0)
Monocytes Relative: 13 % — ABNORMAL HIGH (ref 3.0–12.0)
Neutro Abs: 4.3 10*3/uL (ref 1.4–7.7)
Neutrophils Relative %: 51.3 % (ref 43.0–77.0)
Platelets: 264 10*3/uL (ref 150.0–400.0)
RBC: 6.07 Mil/uL — ABNORMAL HIGH (ref 3.87–5.11)
RDW: 16.1 % — ABNORMAL HIGH (ref 11.5–15.5)
WBC: 8.3 10*3/uL (ref 4.0–10.5)

## 2018-11-03 NOTE — Progress Notes (Deleted)
Virtual Visit via Video Note  I connected with@ on 11/03/18 at  2:00 PM EDT by a video enabled telemedicine application and verified that I am speaking with the correct person using two identifiers. Location patient: home Location provider:work or home office Persons participating in the virtual visit: patient, provider  [place] national recommendations  regarding COVID 19 pandemic   video visit is advised over in office visit for this patient.  Patient aware  of the limitations of evaluation and management by telemedicine and  availability of in person appointments. and agreed to proceed.   HPI: Rebecca Mcintyre presents for video visit    ROS: See pertinent positives and negatives per HPI.  Past Medical History:  Diagnosis Date  . Allergy   . ASCUS on Pap smear    had repeat x 2 normal with [name] Plan 2011  . Gout   . Hyperlipidemia   . Hypertension   . Myocardial infarction [facility])    2002 95% proximal LAD stenosis with thrombus followed by 90% stenosis, the first diagonal 80% stenosis, circumflex 30% stenosis, circumflex obtuse marginal subbranch 70% stenosis, PDA 40% stenosis. She had a drug-eluting stent placement with a Pixel stent  . Osteopenia   . Solitary kidney     Past Surgical History:  Procedure Laterality Date  . [place]  2011  . LAD Stent angioplasty  2002  . Right oophorectomy with salpingectomy     benign growth Dr. Joneen Caraway    Family History  Problem Relation Age of Onset  . Breast cancer Mother 41  . Lung cancer Father   . Heart attack Brother        Died age 36 MI  . Colon cancer Brother   . Heart disease Daughter 80       smoker and overweight   . Esophageal cancer Neg Hx   . Pancreatic cancer Neg Hx   . Rectal cancer Neg Hx   . Stomach cancer Neg Hx     Social History   Tobacco Use  . Smoking status: Former Smoker    Types: Cigarettes  . Smokeless tobacco: Never Used  . Tobacco comment: 37 years  Substance Use Topics  .  Alcohol use: Yes    Comment: 1-2 per month  . Drug use: No      Current Outpatient Medications:  .  albuterol (VENTOLIN HFA) 108 (90 Base) MCG/ACT inhaler, Inhale 2 puffs into the lungs every 6 (six) hours as needed for wheezing or shortness of breath., Disp: 1 Inhaler, Rfl: 1 .  allopurinol (ZYLOPRIM) 100 MG tablet, Take 1 tablet (100 mg total) by mouth daily., Disp: 30 tablet, Rfl: 2 .  allopurinol (ZYLOPRIM) 100 MG tablet, TAKE 1 TABLET BY MOUTH EVERY DAY, Disp: 90 tablet, Rfl: 1 .  amoxicillin-clavulanate (AUGMENTIN) 875-125 MG tablet, Take 1 tablet by mouth every 12 (twelve) hours. For sinusiits, Disp: 14 tablet, Rfl: 0 .  aspirin 81 MG tablet, Take 81 mg by mouth daily., Disp: , Rfl:  .  diclofenac sodium (VOLTAREN) 1 % GEL, Apply 2 g topically 4 (four) times daily. Rub into affected area of foot 2 to 4 times daily, Disp: 100 g, Rfl: 2 .  metFORMIN (GLUCOPHAGE) 500 MG tablet, TAKE 1 TABLET (500 MG TOTAL) BY MOUTH 2 (TWO) TIMES DAILY WITH A MEAL., Disp: 180 tablet, Rfl: 1 .  metoprolol tartrate (LOPRESSOR) 25 MG tablet, TAKE 1/2 TABLETS 2 TIMES DAILY BY MOUTH., Disp: 90 tablet, Rfl: 1 .  montelukast (SINGULAIR) 10  MG tablet, TAKE 1 TABLET (10 MG TOTAL) AT BEDTIME BY MOUTH., Disp: 90 tablet, Rfl: 0 .  MULTIPLE VITAMIN PO, Take 1 tablet by mouth daily. , Disp: , Rfl:  .  nitroGLYCERIN (NITROSTAT) 0.4 MG SL tablet, Place 1 tablet (0.4 mg total) under the tongue every 5 (five) minutes as needed., Disp: 30 tablet, Rfl: 0 .  promethazine (PHENERGAN) 25 MG tablet, Take 1 tablet (25 mg total) by mouth every 8 (eight) hours as needed for nausea or vomiting., Disp: 20 tablet, Rfl: 0 .  simvastatin (ZOCOR) 40 MG tablet, TAKE 1 TABLET AT BEDTIME BY MOUTH., Disp: 90 tablet, Rfl: 1 .  traMADol (ULTRAM) 50 MG tablet, Take 1 tablet (50 mg total) by mouth every 8 (eight) hours as needed., Disp: 15 tablet, Rfl: 0  EXAM: BP Readings from Last 3 Encounters:  03/25/18 122/62  02/24/18 113/63  01/22/18  122/76    VITALS per patient if applicable:  GENERAL: alert, oriented, appears well and in no acute distress  HEENT: atraumatic, conjunttiva clear, no obvious abnormalities on inspection of external nose and ears  NECK: normal movements of the head and neck  LUNGS: on inspection no signs of respiratory distress, breathing rate appears normal, no obvious gross SOB, gasping or wheezing  CV: no obvious cyanosis  MS: moves all visible extremities without noticeable abnormality  PSYCH/NEURO: pleasant and cooperative, no obvious depression or anxiety, speech and thought processing grossly intact Lab Results  Component Value Date   WBC 8.3 11/03/2018   HGB 16.9 (H) 11/03/2018   HCT 53.1 (H) 11/03/2018   PLT 264.0 11/03/2018   GLUCOSE 103 (H) 03/10/2018   CHOL 142 03/10/2018   TRIG 137.0 03/10/2018   HDL 40.60 03/10/2018   LDLCALC 74 03/10/2018   ALT 13 03/10/2018   AST 10 03/10/2018   NA 142 03/10/2018   K 4.3 03/10/2018   CL 102 03/10/2018   CREATININE 0.73 03/10/2018   BUN 21 03/10/2018   CO2 33 (H) 03/10/2018   TSH 2.37 03/10/2018   HGBA1C 7.0 (H) 11/03/2018   MICROALBUR 1.1 03/10/2017    ASSESSMENT AND PLAN:  Discussed the following assessment and plan:  No diagnosis found.  Counseled.   Expectant management and discussion of plan and treatment with opportunity to ask questions and all were answered. The patient agreed with the plan and demonstrated an understanding of the instructions.   Advised to call back or seek an in-person evaluation if worsening  or having  further concerns .  I provided *** minutes of non-face-to-face time during this encounter.   Shanon Ace, MD

## 2018-11-04 ENCOUNTER — Ambulatory Visit: Payer: Medicare Other | Admitting: Internal Medicine

## 2018-11-04 ENCOUNTER — Emergency Department (HOSPITAL_COMMUNITY): Payer: Medicare Other

## 2018-11-04 ENCOUNTER — Encounter (HOSPITAL_COMMUNITY): Payer: Self-pay

## 2018-11-04 ENCOUNTER — Other Ambulatory Visit: Payer: Self-pay

## 2018-11-04 ENCOUNTER — Ambulatory Visit: Payer: Self-pay | Admitting: Internal Medicine

## 2018-11-04 ENCOUNTER — Inpatient Hospital Stay (HOSPITAL_COMMUNITY): Payer: Medicare Other

## 2018-11-04 ENCOUNTER — Inpatient Hospital Stay (HOSPITAL_COMMUNITY)
Admission: EM | Admit: 2018-11-04 | Discharge: 2018-11-12 | DRG: 291 | Disposition: A | Discharge status: Home Health | Payer: Medicare Other | Attending: Internal Medicine | Admitting: Internal Medicine

## 2018-11-04 DIAGNOSIS — J9601 Acute respiratory failure with hypoxia: Secondary | ICD-10-CM | POA: Diagnosis not present

## 2018-11-04 DIAGNOSIS — I361 Nonrheumatic tricuspid (valve) insufficiency: Secondary | ICD-10-CM | POA: Diagnosis not present

## 2018-11-04 DIAGNOSIS — Z888 Allergy status to other drugs, medicaments and biological substances status: Secondary | ICD-10-CM

## 2018-11-04 DIAGNOSIS — E876 Hypokalemia: Secondary | ICD-10-CM | POA: Diagnosis not present

## 2018-11-04 DIAGNOSIS — I509 Heart failure, unspecified: Secondary | ICD-10-CM

## 2018-11-04 DIAGNOSIS — Z955 Presence of coronary angioplasty implant and graft: Secondary | ICD-10-CM

## 2018-11-04 DIAGNOSIS — G4733 Obstructive sleep apnea (adult) (pediatric): Secondary | ICD-10-CM | POA: Diagnosis not present

## 2018-11-04 DIAGNOSIS — R0902 Hypoxemia: Secondary | ICD-10-CM

## 2018-11-04 DIAGNOSIS — I2781 Cor pulmonale (chronic): Secondary | ICD-10-CM | POA: Diagnosis present

## 2018-11-04 DIAGNOSIS — J439 Emphysema, unspecified: Secondary | ICD-10-CM | POA: Diagnosis not present

## 2018-11-04 DIAGNOSIS — I5031 Acute diastolic (congestive) heart failure: Secondary | ICD-10-CM | POA: Diagnosis not present

## 2018-11-04 DIAGNOSIS — M109 Gout, unspecified: Secondary | ICD-10-CM | POA: Diagnosis not present

## 2018-11-04 DIAGNOSIS — Z6835 Body mass index (BMI) 35.0-35.9, adult: Secondary | ICD-10-CM | POA: Diagnosis not present

## 2018-11-04 DIAGNOSIS — Z79899 Other long term (current) drug therapy: Secondary | ICD-10-CM

## 2018-11-04 DIAGNOSIS — F17210 Nicotine dependence, cigarettes, uncomplicated: Secondary | ICD-10-CM | POA: Diagnosis present

## 2018-11-04 DIAGNOSIS — E669 Obesity, unspecified: Secondary | ICD-10-CM | POA: Diagnosis present

## 2018-11-04 DIAGNOSIS — Z87891 Personal history of nicotine dependence: Secondary | ICD-10-CM

## 2018-11-04 DIAGNOSIS — R0602 Shortness of breath: Secondary | ICD-10-CM

## 2018-11-04 DIAGNOSIS — I10 Essential (primary) hypertension: Secondary | ICD-10-CM | POA: Diagnosis not present

## 2018-11-04 DIAGNOSIS — M8580 Other specified disorders of bone density and structure, unspecified site: Secondary | ICD-10-CM | POA: Diagnosis present

## 2018-11-04 DIAGNOSIS — Z6836 Body mass index (BMI) 36.0-36.9, adult: Secondary | ICD-10-CM | POA: Diagnosis not present

## 2018-11-04 DIAGNOSIS — E785 Hyperlipidemia, unspecified: Secondary | ICD-10-CM | POA: Diagnosis present

## 2018-11-04 DIAGNOSIS — R55 Syncope and collapse: Secondary | ICD-10-CM

## 2018-11-04 DIAGNOSIS — Q600 Renal agenesis, unilateral: Secondary | ICD-10-CM | POA: Diagnosis not present

## 2018-11-04 DIAGNOSIS — I2510 Atherosclerotic heart disease of native coronary artery without angina pectoris: Secondary | ICD-10-CM | POA: Diagnosis present

## 2018-11-04 DIAGNOSIS — J9611 Chronic respiratory failure with hypoxia: Secondary | ICD-10-CM | POA: Diagnosis present

## 2018-11-04 DIAGNOSIS — R7303 Prediabetes: Secondary | ICD-10-CM

## 2018-11-04 DIAGNOSIS — I252 Old myocardial infarction: Secondary | ICD-10-CM

## 2018-11-04 DIAGNOSIS — Z9981 Dependence on supplemental oxygen: Secondary | ICD-10-CM | POA: Diagnosis not present

## 2018-11-04 DIAGNOSIS — Z66 Do not resuscitate: Secondary | ICD-10-CM | POA: Diagnosis not present

## 2018-11-04 DIAGNOSIS — J9602 Acute respiratory failure with hypercapnia: Secondary | ICD-10-CM

## 2018-11-04 DIAGNOSIS — Z8249 Family history of ischemic heart disease and other diseases of the circulatory system: Secondary | ICD-10-CM

## 2018-11-04 DIAGNOSIS — I110 Hypertensive heart disease with heart failure: Principal | ICD-10-CM | POA: Diagnosis present

## 2018-11-04 DIAGNOSIS — J9621 Acute and chronic respiratory failure with hypoxia: Secondary | ICD-10-CM | POA: Diagnosis present

## 2018-11-04 DIAGNOSIS — J9622 Acute and chronic respiratory failure with hypercapnia: Secondary | ICD-10-CM | POA: Diagnosis not present

## 2018-11-04 DIAGNOSIS — Z791 Long term (current) use of non-steroidal anti-inflammatories (NSAID): Secondary | ICD-10-CM

## 2018-11-04 DIAGNOSIS — I2609 Other pulmonary embolism with acute cor pulmonale: Secondary | ICD-10-CM | POA: Diagnosis not present

## 2018-11-04 DIAGNOSIS — E119 Type 2 diabetes mellitus without complications: Secondary | ICD-10-CM

## 2018-11-04 DIAGNOSIS — D751 Secondary polycythemia: Secondary | ICD-10-CM | POA: Diagnosis present

## 2018-11-04 DIAGNOSIS — I5033 Acute on chronic diastolic (congestive) heart failure: Secondary | ICD-10-CM | POA: Diagnosis present

## 2018-11-04 DIAGNOSIS — Z20828 Contact with and (suspected) exposure to other viral communicable diseases: Secondary | ICD-10-CM | POA: Diagnosis present

## 2018-11-04 DIAGNOSIS — I2723 Pulmonary hypertension due to lung diseases and hypoxia: Secondary | ICD-10-CM | POA: Diagnosis not present

## 2018-11-04 DIAGNOSIS — E6609 Other obesity due to excess calories: Secondary | ICD-10-CM | POA: Diagnosis not present

## 2018-11-04 DIAGNOSIS — J449 Chronic obstructive pulmonary disease, unspecified: Secondary | ICD-10-CM | POA: Diagnosis not present

## 2018-11-04 DIAGNOSIS — J849 Interstitial pulmonary disease, unspecified: Secondary | ICD-10-CM | POA: Diagnosis present

## 2018-11-04 DIAGNOSIS — Z7982 Long term (current) use of aspirin: Secondary | ICD-10-CM

## 2018-11-04 DIAGNOSIS — Z79891 Long term (current) use of opiate analgesic: Secondary | ICD-10-CM

## 2018-11-04 DIAGNOSIS — R220 Localized swelling, mass and lump, head: Secondary | ICD-10-CM | POA: Diagnosis not present

## 2018-11-04 DIAGNOSIS — Z7984 Long term (current) use of oral hypoglycemic drugs: Secondary | ICD-10-CM

## 2018-11-04 DIAGNOSIS — Z9181 History of falling: Secondary | ICD-10-CM

## 2018-11-04 LAB — ECHOCARDIOGRAM COMPLETE
Height: 63 in
Weight: 2960 oz

## 2018-11-04 LAB — COMPREHENSIVE METABOLIC PANEL
ALT: 52 U/L — ABNORMAL HIGH (ref 0–44)
AST: 40 U/L (ref 15–41)
Albumin: 3.6 g/dL (ref 3.5–5.0)
Alkaline Phosphatase: 115 U/L (ref 38–126)
Anion gap: 9 (ref 5–15)
BUN: 23 mg/dL (ref 8–23)
CO2: 33 mmol/L — ABNORMAL HIGH (ref 22–32)
Calcium: 8.7 mg/dL — ABNORMAL LOW (ref 8.9–10.3)
Chloride: 95 mmol/L — ABNORMAL LOW (ref 98–111)
Creatinine, Ser: 0.88 mg/dL (ref 0.44–1.00)
GFR calc Af Amer: >60 mL/min (ref >60)
GFR calc non Af Amer: >60 mL/min (ref >60)
Glucose, Bld: 108 mg/dL — ABNORMAL HIGH (ref 70–99)
Potassium: 4.4 mmol/L (ref 3.5–5.1)
Sodium: 137 mmol/L (ref 135–145)
Total Bilirubin: 0.6 mg/dL (ref 0.3–1.2)
Total Protein: 7.4 g/dL (ref 6.5–8.1)

## 2018-11-04 LAB — CBC WITH DIFFERENTIAL/PLATELET
Abs Immature Granulocytes: 0.05 10*3/uL (ref 0.00–0.07)
Basophils Absolute: 0.1 10*3/uL (ref 0.0–0.1)
Basophils Relative: 1 %
Eosinophils Absolute: 0.1 10*3/uL (ref 0.0–0.5)
Eosinophils Relative: 1 %
HCT: 57 % — ABNORMAL HIGH (ref 36.0–46.0)
Hemoglobin: 17.4 g/dL — ABNORMAL HIGH (ref 12.0–15.0)
Immature Granulocytes: 1 %
Lymphocytes Relative: 23 %
Lymphs Abs: 2.2 10*3/uL (ref 0.7–4.0)
MCH: 27.6 pg (ref 26.0–34.0)
MCHC: 30.5 g/dL (ref 30.0–36.0)
MCV: 90.3 fL (ref 80.0–100.0)
Monocytes Absolute: 1.1 10*3/uL — ABNORMAL HIGH (ref 0.1–1.0)
Monocytes Relative: 11 %
Neutro Abs: 6.1 10*3/uL (ref 1.7–7.7)
Neutrophils Relative %: 63 %
Platelets: 274 10*3/uL (ref 150–400)
RBC: 6.31 MIL/uL — ABNORMAL HIGH (ref 3.87–5.11)
RDW: 15.8 % — ABNORMAL HIGH (ref 11.5–15.5)
WBC: 9.6 10*3/uL (ref 4.0–10.5)
nRBC: 0 % (ref 0.0–0.2)

## 2018-11-04 LAB — GLUCOSE, CAPILLARY
Glucose-Capillary: 75 mg/dL (ref 70–99)
Glucose-Capillary: 135 mg/dL — ABNORMAL HIGH (ref 70–99)

## 2018-11-04 LAB — D-DIMER, QUANTITATIVE: D-Dimer, Quant: 0.81 ug/mL-FEU — ABNORMAL HIGH (ref 0.00–0.50)

## 2018-11-04 LAB — BRAIN NATRIURETIC PEPTIDE: B Natriuretic Peptide: 586.9 pg/mL — ABNORMAL HIGH (ref 0.0–100.0)

## 2018-11-04 LAB — I-STAT TROPONIN, ED: Troponin i, poc: 0.01 ng/mL (ref 0.00–0.08)

## 2018-11-04 LAB — SARS CORONAVIRUS 2 BY RT PCR (HOSPITAL ORDER, PERFORMED IN ~~LOC~~ HOSPITAL LAB): SARS Coronavirus 2: NEGATIVE

## 2018-11-04 MED ORDER — SODIUM CHLORIDE (PF) 0.9 % IJ SOLN
INTRAMUSCULAR | Status: AC
  Filled 2018-11-04: qty 50

## 2018-11-04 MED ORDER — INSULIN ASPART 100 UNIT/ML ~~LOC~~ SOLN
0.0000 [IU] | Freq: Three times a day (TID) | SUBCUTANEOUS | Status: DC
  Administered 2018-11-07: 2 [IU] via SUBCUTANEOUS
  Administered 2018-11-07 – 2018-11-08 (×3): 1 [IU] via SUBCUTANEOUS
  Administered 2018-11-10: 2 [IU] via SUBCUTANEOUS
  Administered 2018-11-11: 1 [IU] via SUBCUTANEOUS

## 2018-11-04 MED ORDER — POTASSIUM CHLORIDE CRYS ER 20 MEQ PO TBCR
40.0000 meq | EXTENDED_RELEASE_TABLET | Freq: Two times a day (BID) | ORAL | Status: DC
  Administered 2018-11-04: 40 meq via ORAL
  Filled 2018-11-04: qty 2

## 2018-11-04 MED ORDER — IPRATROPIUM-ALBUTEROL 0.5-2.5 (3) MG/3ML IN SOLN
3.0000 mL | Freq: Three times a day (TID) | RESPIRATORY_TRACT | Status: DC
  Administered 2018-11-05 – 2018-11-10 (×18): 3 mL via RESPIRATORY_TRACT
  Filled 2018-11-04 (×18): qty 3

## 2018-11-04 MED ORDER — FUROSEMIDE 10 MG/ML IJ SOLN
20.0000 mg | Freq: Once | INTRAMUSCULAR | Status: AC
  Administered 2018-11-04: 20 mg via INTRAVENOUS

## 2018-11-04 MED ORDER — ENOXAPARIN SODIUM 40 MG/0.4ML ~~LOC~~ SOLN
40.0000 mg | SUBCUTANEOUS | Status: DC
  Administered 2018-11-04 – 2018-11-11 (×8): 40 mg via SUBCUTANEOUS
  Filled 2018-11-04 (×9): qty 0.4

## 2018-11-04 MED ORDER — ALLOPURINOL 100 MG PO TABS
100.0000 mg | ORAL_TABLET | Freq: Every day | ORAL | Status: DC
  Administered 2018-11-04 – 2018-11-12 (×8): 100 mg via ORAL
  Filled 2018-11-04 (×8): qty 1

## 2018-11-04 MED ORDER — ONDANSETRON HCL 4 MG/2ML IJ SOLN: 4.0000 mg | Freq: Four times a day (QID) | INTRAMUSCULAR | Status: DC | PRN

## 2018-11-04 MED ORDER — ALBUTEROL SULFATE (2.5 MG/3ML) 0.083% IN NEBU
3.0000 mL | INHALATION_SOLUTION | Freq: Four times a day (QID) | RESPIRATORY_TRACT | Status: DC | PRN
  Administered 2018-11-04: 3 mL via RESPIRATORY_TRACT
  Filled 2018-11-04: qty 3

## 2018-11-04 MED ORDER — FUROSEMIDE 10 MG/ML IJ SOLN
INTRAMUSCULAR | Status: AC
  Filled 2018-11-04: qty 4

## 2018-11-04 MED ORDER — IOHEXOL 350 MG/ML SOLN
100.0000 mL | Freq: Once | INTRAVENOUS | Status: AC | PRN
  Administered 2018-11-04: 100 mL via INTRAVENOUS

## 2018-11-04 MED ORDER — SODIUM CHLORIDE 0.9 % IV SOLN
INTRAVENOUS | Status: DC
  Administered 2018-11-04: 12:00:00 via INTRAVENOUS

## 2018-11-04 MED ORDER — ACETAMINOPHEN 650 MG RE SUPP: 650.0000 mg | Freq: Four times a day (QID) | RECTAL | Status: DC | PRN

## 2018-11-04 MED ORDER — ONDANSETRON HCL 4 MG PO TABS: 4.0000 mg | ORAL_TABLET | Freq: Four times a day (QID) | ORAL | Status: DC | PRN

## 2018-11-04 MED ORDER — FUROSEMIDE 10 MG/ML IJ SOLN
40.0000 mg | Freq: Two times a day (BID) | INTRAMUSCULAR | Status: DC
  Administered 2018-11-04 – 2018-11-05 (×3): 40 mg via INTRAVENOUS
  Filled 2018-11-04 (×3): qty 4

## 2018-11-04 MED ORDER — METOPROLOL TARTRATE 25 MG PO TABS
12.5000 mg | ORAL_TABLET | Freq: Two times a day (BID) | ORAL | Status: DC
  Administered 2018-11-04 – 2018-11-12 (×15): 12.5 mg via ORAL
  Filled 2018-11-04 (×15): qty 1

## 2018-11-04 MED ORDER — ACETAMINOPHEN 325 MG PO TABS
650.0000 mg | ORAL_TABLET | Freq: Four times a day (QID) | ORAL | Status: DC | PRN
  Administered 2018-11-06 – 2018-11-12 (×9): 650 mg via ORAL
  Filled 2018-11-04 (×9): qty 2

## 2018-11-04 MED ORDER — ASPIRIN EC 81 MG PO TBEC
81.0000 mg | DELAYED_RELEASE_TABLET | Freq: Every day | ORAL | Status: DC
  Administered 2018-11-04 – 2018-11-12 (×8): 81 mg via ORAL
  Filled 2018-11-04 (×8): qty 1

## 2018-11-04 MED ORDER — SIMVASTATIN 40 MG PO TABS
40.0000 mg | ORAL_TABLET | Freq: Every day | ORAL | Status: DC
  Administered 2018-11-04 – 2018-11-11 (×8): 40 mg via ORAL
  Filled 2018-11-04 (×8): qty 1

## 2018-11-04 MED ORDER — METOPROLOL TARTRATE 25 MG PO TABS: 12.5000 mg | ORAL_TABLET | Freq: Two times a day (BID) | ORAL | Status: DC

## 2018-11-04 MED ORDER — ALBUTEROL SULFATE HFA 108 (90 BASE) MCG/ACT IN AERS
2.0000 | INHALATION_SPRAY | Freq: Once | RESPIRATORY_TRACT | Status: AC
  Administered 2018-11-04: 2 via RESPIRATORY_TRACT
  Filled 2018-11-04: qty 6.7

## 2018-11-04 MED ORDER — MONTELUKAST SODIUM 10 MG PO TABS
10.0000 mg | ORAL_TABLET | Freq: Every day | ORAL | Status: DC
  Administered 2018-11-04 – 2018-11-11 (×8): 10 mg via ORAL
  Filled 2018-11-04 (×8): qty 1

## 2018-11-04 NOTE — ED Notes (Signed)
ED TO INPATIENT HANDOFF REPORT  Name/Age/Gender Rebecca Mcintyre 73 y.o. female  Code Status    Code Status Orders  (From admission, onward)         Start     Ordered   11/04/18 1503  Do not attempt resuscitation (DNR)  Continuous    Question Answer Comment  In the event of cardiac or respiratory ARREST Do not call a "code blue"   In the event of cardiac or respiratory ARREST Do not perform Intubation, CPR, defibrillation or ACLS   In the event of cardiac or respiratory ARREST Use medication by any route, position, wound care, and other measures to relive pain and suffering. May use oxygen, suction and manual treatment of airway obstruction as needed for comfort.      11/04/18 1502        Code Status History    This patient has a current code status but no historical code status.      Home/SNF/Other Home  Chief Complaint fell, hit head  Level of Care/Admitting Diagnosis ED Disposition    ED Disposition Condition [place]: [place] [[id number]]  Level of Care: Telemetry [5]  Admit to tele based on following criteria: Eval of Syncope  Covid Evaluation: Screening Protocol (No Symptoms)  Diagnosis: CHF (congestive heart failure) [facility]) [[id number]]  Admitting Physician: Domenic Polite [[id number]]  Attending Physician: Domenic Polite [[id number]]  Estimated length of stay: past midnight tomorrow  Certification:: I certify this patient will need inpatient services for at least 2 midnights  PT Class (Do Not Modify): Inpatient [101]  PT Acc Code (Do Not Modify): Private [1]       Medical History Past Medical History:  Diagnosis Date  . Allergy   . ASCUS on Pap smear    had repeat x 2 normal with [name] Plan 2011  . Gout   . Hyperlipidemia   . Hypertension   . Myocardial infarction [facility])    2002 95% proximal LAD stenosis with thrombus followed by 90% stenosis, the first diagonal 80% stenosis, circumflex 30% stenosis, circumflex obtuse  marginal subbranch 70% stenosis, PDA 40% stenosis. She had a drug-eluting stent placement with a Pixel stent  . Osteopenia   . Solitary kidney     Allergies Allergies  Allergen Reactions  . Atorvastatin     REACTION: myalgias  and liver    IV Location/Drains/Wounds Patient Lines/Drains/Airways Status   Active Line/Drains/Airways    Name:   Placement date:   Placement time:   Site:   Days:   Peripheral IV 11/04/18 Right Antecubital   11/04/18    1100    Antecubital   less than 1          Labs/Imaging Results for orders placed or performed during the hospital encounter of 11/04/18 (from the past 48 hour(s))  SARS Coronavirus 2 (CEPHEID - Performed in [place] hospital lab), Hosp Order     Status: None   Collection Time: 11/04/18 11:02 AM  Result Value Ref Range   SARS Coronavirus 2 NEGATIVE NEGATIVE    Comment: (NOTE) If result is NEGATIVE SARS-CoV-2 target nucleic acids are NOT DETECTED. The SARS-CoV-2 RNA is generally detectable in upper and lower  respiratory specimens during the acute phase of infection. The lowest  concentration of SARS-CoV-2 viral copies this assay can detect is 250  copies / mL. A negative result does not preclude SARS-CoV-2 infection  and should not be used as the sole basis for  treatment or other  patient management decisions.  A negative result may occur with  improper specimen collection / handling, submission of specimen other  than nasopharyngeal swab, presence of viral mutation(s) within the  areas targeted by this assay, and inadequate number of viral copies  (<250 copies / mL). A negative result must be combined with clinical  observations, patient history, and epidemiological information. If result is POSITIVE SARS-CoV-2 target nucleic acids are DETECTED. The SARS-CoV-2 RNA is generally detectable in upper and lower  respiratory specimens dur ing the acute phase of infection.  Positive  results are indicative of active infection with  SARS-CoV-2.  Clinical  correlation with patient history and other diagnostic information is  necessary to determine patient infection status.  Positive results do  not rule out bacterial infection or co-infection with other viruses. If result is PRESUMPTIVE POSTIVE SARS-CoV-2 nucleic acids MAY BE PRESENT.   A presumptive positive result was obtained on the submitted specimen  and confirmed on repeat testing.  While 2019 novel coronavirus  (SARS-CoV-2) nucleic acids may be present in the submitted sample  additional confirmatory testing may be necessary for epidemiological  and / or clinical management purposes  to differentiate between  SARS-CoV-2 and other Sarbecovirus currently known to infect humans.  If clinically indicated additional testing with an alternate test  methodology [phone]) is advised. The SARS-CoV-2 RNA is generally  detectable in upper and lower respiratory sp ecimens during the acute  phase of infection. The expected result is Negative. Fact Sheet for Patients:  StrictlyIdeas.no Fact Sheet for Healthcare Providers: BankingDealers.co.za This test is not yet approved or cleared by the [place] FDA and has been authorized for detection and/or diagnosis of SARS-CoV-2 by FDA under an Emergency Use Authorization (EUA).  This EUA will remain in effect (meaning this test can be used) for the duration of the COVID-19 declaration under Section 564(b)(1) of the Act, 21 U.S.C. section 360bbb-3(b)(1), unless the authorization is terminated or revoked sooner. Performed at [facility], [place], [place] [address], [place], [place] [address]   CBC with Differential/Platelet     Status: Abnormal   Collection Time: 11/04/18 11:05 AM  Result Value Ref Range   WBC 9.6 4.0 - 10.5 K/uL   RBC 6.31 (H) 3.87 - 5.11 MIL/uL   Hemoglobin 17.4 (H) 12.0 - 15.0 g/dL   HCT 57.0 (H) 36.0 - 46.0 %   MCV 90.3 80.0 - 100.0 fL   MCH  27.6 26.0 - 34.0 pg   MCHC 30.5 30.0 - 36.0 g/dL   RDW 15.8 (H) 11.5 - 15.5 %   Platelets 274 150 - 400 K/uL   nRBC 0.0 0.0 - 0.2 %   Neutrophils Relative % 63 %   Neutro Abs 6.1 1.7 - 7.7 K/uL   Lymphocytes Relative 23 %   Lymphs Abs 2.2 0.7 - 4.0 K/uL   Monocytes Relative 11 %   Monocytes Absolute 1.1 (H) 0.1 - 1.0 K/uL   Eosinophils Relative 1 %   Eosinophils Absolute 0.1 0.0 - 0.5 K/uL   Basophils Relative 1 %   Basophils Absolute 0.1 0.0 - 0.1 K/uL   Immature Granulocytes 1 %   Abs Immature Granulocytes 0.05 0.00 - 0.07 K/uL    Comment: Performed at [facility], [place] [address], [place], [place] [address]  Comprehensive metabolic panel     Status: Abnormal   Collection Time: 11/04/18 11:05 AM  Result Value Ref Range   Sodium 137 135 - 145 mmol/L  Potassium 4.4 3.5 - 5.1 mmol/L   Chloride 95 (L) 98 - 111 mmol/L   CO2 33 (H) 22 - 32 mmol/L   Glucose, Bld 108 (H) 70 - 99 mg/dL   BUN 23 8 - 23 mg/dL   Creatinine, Ser 0.88 0.44 - 1.00 mg/dL   Calcium 8.7 (L) 8.9 - 10.3 mg/dL   Total Protein 7.4 6.5 - 8.1 g/dL   Albumin 3.6 3.5 - 5.0 g/dL   AST 40 15 - 41 U/L   ALT 52 (H) 0 - 44 U/L   Alkaline Phosphatase 115 38 - 126 U/L   Total Bilirubin 0.6 0.3 - 1.2 mg/dL   GFR calc non Af Amer >60 >60 mL/min   GFR calc Af Amer >60 >60 mL/min   Anion gap 9 5 - 15    Comment: Performed at [facility], [place] [address] [place], [place] [address]  Brain natriuretic peptide     Status: Abnormal   Collection Time: 11/04/18 11:05 AM  Result Value Ref Range   B Natriuretic Peptide 586.9 (H) 0.0 - 100.0 pg/mL    Comment: Performed at [facility], [place] [address], [place], [place] [address]  D-dimer, quantitative (not at [facility])     Status: Abnormal   Collection Time: 11/04/18 11:06 AM  Result Value Ref Range   D-Dimer, Quant 0.81 (H) 0.00 - 0.50 ug/mL-FEU    Comment: (NOTE) At the manufacturer cut-off of 0.50 ug/mL FEU, this assay has  been documented to exclude PE with a sensitivity and negative predictive value of 97 to 99%.  At this time, this assay has not been approved by the FDA to exclude DVT/VTE. Results should be correlated with clinical presentation. Performed at [facility], [place] [address] [place], [place] [address]   I-stat troponin, ED     Status: None   Collection Time: 11/04/18 11:15 AM  Result Value Ref Range   Troponin i, poc 0.01 0.00 - 0.08 ng/mL   Comment 3            Comment: Due to the release kinetics of cTnI, a negative result within the first hours of the onset of symptoms does not rule out myocardial infarction with certainty. If myocardial infarction is still suspected, repeat the test at appropriate intervals.    Dg Chest 2 View  Result Date: 11/04/2018 CLINICAL DATA:  Shortness of breath. Fall. EXAM: CHEST - 2 VIEW COMPARISON:  Chest radiographs 03/11/2017 and CT 03/17/2017 FINDINGS: The cardiac silhouette is upper limits of normal in size. Aortic atherosclerosis is noted. There is chronic peribronchial thickening with upper lobe predominant emphysema as shown on CT. No acute airspace consolidation, overt pulmonary edema, pleural effusion, pneumothorax is identified. No acute osseous abnormality is seen. IMPRESSION: COPD without evidence of acute cardiopulmonary process. Electronically Signed   By: Logan Bores M.D.   On: 11/04/2018 11:49   Ct Angio Chest Pe W/cm &/or Wo Cm  Result Date: 11/04/2018 CLINICAL DATA:  Decreased O2 saturations. Shortness of breath for 3 weeks. Bilateral foot and ankle swelling for a month. EXAM: CT ANGIOGRAPHY CHEST WITH CONTRAST TECHNIQUE: Multidetector CT imaging of the chest was performed using the standard protocol during bolus administration of intravenous contrast. Multiplanar CT image reconstructions and MIPs were obtained to evaluate the vascular anatomy. CONTRAST:  174m OMNIPAQUE IOHEXOL 350 MG/ML SOLN COMPARISON:  Chest x-ray November 04, 2018 and chest CT March 17, 2017 FINDINGS: Cardiovascular: Cardiomegaly is identified. There are coronary artery  calcifications in the right and left coronary arteries. Atherosclerotic changes seen in the nonaneurysmal aorta without dissection. Evaluation of the pulmonary arteries is limited due to respiratory motion. No pulmonary emboli identified. Mediastinum/Nodes: There is debris within the esophagus. The thyroid is unremarkable. The chest wall is unremarkable. No adenopathy. No effusions. Lungs/Pleura: Respiratory motion limits evaluation. Scattered atelectasis. Emphysematous changes, particularly in the upper lobes. There is bronchial wall thickening in the bases. Central airways are normal. No suspicious nodules, masses, or focal infiltrates. Upper Abdomen: No acute abnormality. Musculoskeletal: No chest wall abnormality. No acute or significant osseous findings. Review of the MIP images confirms the above findings. IMPRESSION: 1. No pulmonary emboli identified. Evaluation of distal pulmonary arterial branches is limited due to respiratory motion. 2. Coronary artery calcifications. 3. Cardiomegaly. 4. Emphysematous changes in the lungs. 5. Atherosclerotic change in the thoracic aorta without aneurysm. Aortic Atherosclerosis (ICD10-I70.0) and Emphysema (ICD10-J43.9). Electronically Signed   By: Dorise Bullion III M.D   On: 11/04/2018 12:46    Pending Labs Unresulted Labs (From admission, onward)    Start     Ordered   11/04/18 1503  TSH  Add-on,   R     11/04/18 1502   Signed and Held  CBC  (enoxaparin (LOVENOX)    CrCl >/= 30 ml/min)  Once,   R    Comments:  Baseline for enoxaparin therapy IF NOT ALREADY DRAWN.  Notify MD if PLT < 100 K.    Signed and Held   Signed and Held  Creatinine, serum  (enoxaparin (LOVENOX)    CrCl >/= 30 ml/min)  Once,   R    Comments:  Baseline for enoxaparin therapy IF NOT ALREADY DRAWN.    Signed and Held   Signed and Held  Creatinine, serum  (enoxaparin  (LOVENOX)    CrCl >/= 30 ml/min)  Weekly,   R    Comments:  while on enoxaparin therapy    Signed and Held   Signed and Held  Basic metabolic panel  Tomorrow morning,   R     Signed and Held   Signed and Held  CBC  Tomorrow morning,   R     Signed and Held          Vitals/Pain Today's Vitals   11/04/18 1430 11/04/18 1500 11/04/18 1600 11/04/18 1630  BP: (!) 155/87 (!) 157/82 137/71 (!) 155/72  Pulse: 65 71 83 89  Resp: (!) 25 (!) 27 (!) 25 (!) 23  Temp:      TempSrc:      SpO2: 96% 97% 99% 97%  Weight:      Height:        Isolation Precautions No active isolations  Medications Medications  0.9 %  sodium chloride infusion ( Intravenous New Bag/Given 11/04/18 1141)  sodium chloride (PF) 0.9 % injection (has no administration in time range)  metoprolol tartrate (LOPRESSOR) tablet 12.5 mg (has no administration in time range)  furosemide (LASIX) injection 40 mg (40 mg Intravenous Given 11/04/18 1709)  insulin aspart (novoLOG) injection 0-9 Units (has no administration in time range)  potassium chloride SA (K-DUR) CR tablet 40 mEq (40 mEq Oral Given 11/04/18 1709)  albuterol (VENTOLIN HFA) 108 (90 Base) MCG/ACT inhaler 2 puff (2 puffs Inhalation Given 11/04/18 1140)  iohexol (OMNIPAQUE) 350 MG/ML injection 100 mL (100 mLs Intravenous Contrast Given 11/04/18 1215)    Mobility walks

## 2018-11-04 NOTE — Telephone Encounter (Signed)
Pt at [facility] ED for evaluation.

## 2018-11-04 NOTE — ED Triage Notes (Addendum)
Patient states she went to the bathroom around 0500 and when getting up she found herself on the floor and had hit her head on the cabinet. Patient has a hematoma on the left back. Patient did an E visit this AM which included the fall and c/o  bilateral leg swelling. Patient takes a baby aspirin daily  Patient's room air sats in triage 83% and Res-35. Patient states SOB x 3 weeks.

## 2018-11-04 NOTE — ED Notes (Signed)
Admitting provider at bedside

## 2018-11-04 NOTE — Progress Notes (Signed)
Patient's daughter called and stated the patient was very confused on the phone. Respirations in the 20's. Paged floor coverage.

## 2018-11-04 NOTE — ED Provider Notes (Signed)
[place] DEPT Provider Note   CSN: [id number] Arrival date & time: 11/04/18  0940    History   Chief Complaint Chief Complaint  Patient presents with  . Fall  . Head Injury  . Leg Swelling  . Shortness of Breath    HPI Rebecca Mcintyre is a 73 y.o. female.     72 year old female presents with syncope that occurred while she was going from her toilet to her bed.  Patient had no prodromal symptoms prior to the event.  Denies any seizure activity or loss of bowel or bladder function.  Was not postictal.  States that she has had increasing cough and shortness of breath times several days with dyspnea on exertion as well as orthopnea.  Denies any chest pain or chest pressure.  No cough or fever.  No vomiting or diarrhea.  No recent history of blood loss.  Has also noted increased lower extremity edema.  Was to have an E visit with her doctor but was told to come here.     Past Medical History:  Diagnosis Date  . Allergy   . ASCUS on Pap smear    had repeat x 2 normal with [name] Plan 2011  . Gout   . Hyperlipidemia   . Hypertension   . Myocardial infarction [facility])    2002 95% proximal LAD stenosis with thrombus followed by 90% stenosis, the first diagonal 80% stenosis, circumflex 30% stenosis, circumflex obtuse marginal subbranch 70% stenosis, PDA 40% stenosis. She had a drug-eluting stent placement with a Pixel stent  . Osteopenia   . Solitary kidney     Patient Active Problem List   Diagnosis Date Noted  . Great toe pain, left 01/01/2018  . Myocardial infarction ([place])   . Elevated hemoglobin ([place]) 04/24/2014  . Visit for preventive health examination 04/24/2014  . Pre-diabetes 08/12/2012  . Decreased hearing 04/12/2012  . Welcome to Medicare preventive visit 04/08/2012  . HYPERURICEMIA 03/15/2009  . GOUT 12/13/2008  . OBESITY 08/23/2008  . OSTEOPENIA 09/01/2007  . MYOCARDIAL INFARCTION, HX OF 07/14/2007  . SOLITARY KIDNEY 07/14/2007   . Hyperlipidemia 04/15/2007  . Essential hypertension 04/15/2007  . CORONARY ARTERY DISEASE 04/15/2007  . OTHER ABNORMAL GLUCOSE 04/15/2007  . TOBACCO ABUSE, HX OF 04/15/2007    Past Surgical History:  Procedure Laterality Date  . [place]  2011  . LAD Stent angioplasty  2002  . Right oophorectomy with salpingectomy     benign growth Dr. Joneen Caraway     OB History   No obstetric history on file.      Home Medications    Prior to Admission medications   Medication Sig Start Date End Date Taking? Authorizing Provider  albuterol (VENTOLIN HFA) 108 (90 Base) MCG/ACT inhaler Inhale 2 puffs into the lungs every 6 (six) hours as needed for wheezing or shortness of breath. 09/22/18 09/22/19  Panosh, Standley Brooking, MD  allopurinol (ZYLOPRIM) 100 MG tablet Take 1 tablet (100 mg total) by mouth daily. 01/12/18   Rosemarie Ax, MD  allopurinol (ZYLOPRIM) 100 MG tablet TAKE 1 TABLET BY MOUTH EVERY DAY 09/22/18   Panosh, Standley Brooking, MD  amoxicillin-clavulanate (AUGMENTIN) 875-125 MG tablet Take 1 tablet by mouth every 12 (twelve) hours. For sinusiits 08/28/18   Panosh, Standley Brooking, MD  aspirin 81 MG tablet Take 81 mg by mouth daily.    [provider]  diclofenac sodium (VOLTAREN) 1 % GEL Apply 2 g topically 4 (four) times daily. Rub into  affected area of foot 2 to 4 times daily 04/06/18   Trula Slade, DPM  metFORMIN (GLUCOPHAGE) 500 MG tablet TAKE 1 TABLET (500 MG TOTAL) BY MOUTH 2 (TWO) TIMES DAILY WITH A MEAL. 09/30/18   Panosh, Standley Brooking, MD  metoprolol tartrate (LOPRESSOR) 25 MG tablet TAKE 1/2 TABLETS 2 TIMES DAILY BY MOUTH. 06/30/18   Panosh, Standley Brooking, MD  montelukast (SINGULAIR) 10 MG tablet TAKE 1 TABLET (10 MG TOTAL) AT BEDTIME BY MOUTH. 09/22/18   Panosh, Standley Brooking, MD  MULTIPLE VITAMIN PO Take 1 tablet by mouth daily.     [provider]  nitroGLYCERIN (NITROSTAT) 0.4 MG SL tablet Place 1 tablet (0.4 mg total) under the tongue every 5 (five) minutes as needed. 04/18/14    Panosh, Standley Brooking, MD  promethazine (PHENERGAN) 25 MG tablet Take 1 tablet (25 mg total) by mouth every 8 (eight) hours as needed for nausea or vomiting. 03/02/18   Trula Slade, DPM  simvastatin (ZOCOR) 40 MG tablet TAKE 1 TABLET AT BEDTIME BY MOUTH. 06/30/18   Panosh, Standley Brooking, MD  traMADol (ULTRAM) 50 MG tablet Take 1 tablet (50 mg total) by mouth every 8 (eight) hours as needed. 12/30/17   Panosh, Standley Brooking, MD    Family History Family History  Problem Relation Age of Onset  . Breast cancer Mother 77  . Lung cancer Father   . Heart attack Brother        Died age 74 MI  . Colon cancer Brother   . Heart disease Daughter 59       smoker and overweight   . Esophageal cancer Neg Hx   . Pancreatic cancer Neg Hx   . Rectal cancer Neg Hx   . Stomach cancer Neg Hx     Social History Social History   Tobacco Use  . Smoking status: Former Smoker    Types: Cigarettes  . Smokeless tobacco: Never Used  . Tobacco comment: 37 years  Substance Use Topics  . Alcohol use: Yes    Comment: 1-2 per month  . Drug use: No     Allergies   Atorvastatin   Review of Systems Review of Systems  All other systems reviewed and are negative.    Physical Exam Updated Vital Signs BP 134/81   Pulse 69   Temp 98.1 F (36.7 C) (Oral)   Resp (!) 35   Ht 1.6 m (_0 )   Wt 83.9 kg   SpO2 (!) 83%   BMI 32.77 kg/m   Physical Exam Vitals signs and nursing note reviewed.  Constitutional:      General: She is not in acute distress.    Appearance: Normal appearance. She is well-developed. She is not toxic-appearing.  HENT:     Head: Normocephalic and atraumatic.  Eyes:     General: Lids are normal.     Conjunctiva/sclera: Conjunctivae normal.     Pupils: Pupils are equal, round, and reactive to light.  Neck:     Musculoskeletal: Normal range of motion and neck supple.     Thyroid: No thyroid mass.     Trachea: No tracheal deviation.  Cardiovascular:     Rate and Rhythm: Normal rate and  regular rhythm.     Heart sounds: Normal heart sounds. No murmur. No gallop.   Pulmonary:     Effort: Tachypnea and prolonged expiration present. No respiratory distress.     Breath sounds: No stridor. No decreased breath sounds, wheezing, rhonchi or rales.  Abdominal:     General: Bowel sounds are normal. There is no distension.     Palpations: Abdomen is soft.     Tenderness: There is no abdominal tenderness. There is no rebound.  Musculoskeletal: Normal range of motion.        General: No tenderness.  Skin:    General: Skin is warm and dry.     Findings: No abrasion or rash.  Neurological:     Mental Status: She is alert and oriented to person, place, and time.     GCS: GCS eye subscore is 4. GCS verbal subscore is 5. GCS motor subscore is 6.     Cranial Nerves: No cranial nerve deficit.     Sensory: No sensory deficit.  Psychiatric:        Speech: Speech normal.        Behavior: Behavior normal.      ED Treatments / Results  Labs (all labs ordered are listed, but only abnormal results are displayed) Labs Reviewed  SARS CORONAVIRUS 2 (HOSPITAL ORDER, [place] [facility])  CBC WITH DIFFERENTIAL/PLATELET  COMPREHENSIVE METABOLIC PANEL  BRAIN NATRIURETIC PEPTIDE  I-STAT TROPONIN, ED    EKG EKG Interpretation  Date/Time:  Wednesday November 04 2018 10:12:09 EDT Ventricular Rate:  68 PR Interval:    QRS Duration: 109 QT Interval:  434 QTC Calculation: 462 R Axis:   126 Text Interpretation:  Sinus rhythm Biatrial enlargement Anterolateral infarct, old Baseline wander in lead(s) V1 Confirmed by Lacretia Leigh ([id number]) on 11/04/2018 11:04:33 AM   Radiology No results found.  Procedures Procedures (including critical care time)  Medications Ordered in ED Medications  0.9 %  sodium chloride infusion (has no administration in time range)     Initial Impression / Assessment and Plan / ED Course  I have reviewed the triage vital signs and the nursing  notes.  Pertinent labs & imaging results that were available during my care of the patient were reviewed by me and considered in my medical decision making (see chart for details).        Here with syncope and shortness of breath.  Concern for PE and CT of the chest was negative.  BNP elevated and therefore concerned about CHF.  COVID negative.  Will admit for evaluation of syncope as well as CHF.  CRITICAL CARE Performed by: Leota Jacobsen Total critical care time: 50 minutes Critical care time was exclusive of separately billable procedures and treating other patients. Critical care was necessary to treat or prevent imminent or life-threatening deterioration. Critical care was time spent personally by me on the following activities: development of treatment plan with patient and/or surrogate as well as nursing, discussions with consultants, evaluation of patient's response to treatment, examination of patient, obtaining history from patient or surrogate, ordering and performing treatments and interventions, ordering and review of laboratory studies, ordering and review of radiographic studies, pulse oximetry and re-evaluation of patient's condition.  Rebecca Mcintyre was evaluated in Emergency Department on 11/04/2018 for the symptoms described in the history of present illness. She was evaluated in the context of the global COVID-19 pandemic, which necessitated consideration that the patient might be at risk for infection with the SARS-CoV-2 virus that causes COVID-19. Institutional protocols and algorithms that pertain to the evaluation of patients at risk for COVID-19 are in a state of rapid change based on information released by regulatory bodies including the CDC and federal and state organizations. These policies and algorithms were followed  during the patient's care in the ED.   Final Clinical Impressions(s) / ED Diagnoses   Final diagnoses:  None    ED Discharge Orders    None        Lacretia Leigh, MD 11/04/18 1331

## 2018-11-04 NOTE — Significant Event (Signed)
Rapid Response Event Note  Overview: Time Called: 2244 Arrival Time: 2250 Event Type: Neurologic  Initial Focused Assessment: Patient admitted today for shortness or breath and passed out at home. Patient hit her head when she fell at home. Per primary RN patient is found to have increased confusion. Upon review of patient's chart, a head CT was not done on admission. Primary RN notified Tylene Fantasia, NP and order received for a stat head CT. Primary RN Neila Gear, requested rapid response to bedside to assess patient's confusion. Upon my assessment, patient is orient to person, place, situation, but believed the year was 2019. NIH performed and documented in flow sheet with no significant deficits noted. Harlon Ditty., RN states patient was having shortness of breath and NP ordered Lasix, which was given before my arrival. Course crackles auscultated in bilateral upper lobes, diminished in bilateral lower lobes. Patient states she has no breathing complaints at this time and that her breathing is "better now". NP arrived at bedside to assess patient and patient taken down to CT.   Interventions: NIH performed with score of 4, see flow sheet.  Patient taken to CT.   Plan of Care (if not transferred): RN to follow up with NP in regards to CT results. RN to monitor patient's mentation. Patient currently on continuous pulse oximetry monitoring, RN to monitor patient for increased work of breathing and/or shortness of breath.  Event Summary:  Rebecca Mcintyre

## 2018-11-04 NOTE — Progress Notes (Signed)
Patient increasingly confused. Rapid response called.
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
## 2018-11-04 NOTE — Telephone Encounter (Signed)
Will monitor for ED arrival.

## 2018-11-04 NOTE — Progress Notes (Signed)
Patient feeling short of breath, respirations 34. Paged floor coverage.

## 2018-11-04 NOTE — Subjective & Objective (Signed)
History and Physical    WANELL LORENZI RRN:[id number] DOB: 1945/11/24 DOA: 11/04/2018  Referring MD/NP/PA: EDP PCP:  Patient coming from: Home  Chief Complaint: Passed out  HPI: SASHIA CAMPAS is a 73 y.o. female with medical history significant of CAD, remote stent to LAD back in 2002, prediabetes, obesity, tobacco abuse presented to the emergency room with the above complaint. -She reports ongoing progressive dyspnea on exertion for the past 2 weeks associated with lower extremity edema, abdominal tightness etc. -Mild orthopnea, no PND.  She also noticed that she was using the bathroom more frequently than usual, early this morning remembers walking to the bathroom, subsequently woke up found herself on the floor, recalls regaining consciousness as her head hit the floor, she developed a small bump in the back of her scalp.  ED Course: She is noted to be hypoxic with O2 sats in the mid 80s, CT angiogram was negative for pulmonary embolism, source COVID-19 PCR is pending, chest x-ray was concerning for COPD findings  Review of Systems: As per HPI otherwise 14 point review of systems negative.   Past Medical History:  Diagnosis Date  . Allergy   . ASCUS on Pap smear    had repeat x 2 normal with [name] Plan 2011  . Gout   . Hyperlipidemia   . Hypertension   . Myocardial infarction [facility])    2002 95% proximal LAD stenosis with thrombus followed by 90% stenosis, the first diagonal 80% stenosis, circumflex 30% stenosis, circumflex obtuse marginal subbranch 70% stenosis, PDA 40% stenosis. She had a drug-eluting stent placement with a Pixel stent  . Osteopenia   . Solitary kidney     Past Surgical History:  Procedure Laterality Date  . [place]  2011  . LAD Stent angioplasty  2002  . Right oophorectomy with salpingectomy     benign growth Dr. Joneen Caraway     reports that she has quit smoking. Her smoking use included cigarettes. She has never used smokeless tobacco. She  reports current alcohol use. She reports that she does not use drugs.  Allergies  Allergen Reactions  . Atorvastatin     REACTION: myalgias  and liver    Family History  Problem Relation Age of Onset  . Breast cancer Mother 42  . Lung cancer Father   . Heart attack Brother        Died age 73 MI  . Colon cancer Brother   . Heart disease Daughter 62       smoker and overweight   . Esophageal cancer Neg Hx   . Pancreatic cancer Neg Hx   . Rectal cancer Neg Hx   . Stomach cancer Neg Hx      Prior to Admission medications   Medication Sig Start Date End Date Taking? Authorizing Provider  acetaminophen (TYLENOL) 500 MG tablet Take 1,000 mg by mouth every 6 (six) hours as needed for mild pain.   Yes [provider]  albuterol (VENTOLIN HFA) 108 (90 Base) MCG/ACT inhaler Inhale 2 puffs into the lungs every 6 (six) hours as needed for wheezing or shortness of breath. 09/22/18 09/22/19 Yes Panosh, Standley Brooking, MD  allopurinol (ZYLOPRIM) 100 MG tablet TAKE 1 TABLET BY MOUTH EVERY DAY 09/22/18  Yes Panosh, Standley Brooking, MD  aspirin 81 MG tablet Take 81 mg by mouth daily.   Yes [provider]  metFORMIN (GLUCOPHAGE) 500 MG tablet TAKE 1 TABLET (500 MG TOTAL) BY MOUTH 2 (TWO) TIMES DAILY WITH A  MEAL. 09/30/18  Yes Panosh, Standley Brooking, MD  metoprolol tartrate (LOPRESSOR) 25 MG tablet TAKE 1/2 TABLETS 2 TIMES DAILY BY MOUTH. Patient taking differently: Take 12.5 mg by mouth 2 (two) times daily.  06/30/18  Yes Panosh, Standley Brooking, MD  montelukast (SINGULAIR) 10 MG tablet TAKE 1 TABLET (10 MG TOTAL) AT BEDTIME BY MOUTH. 09/22/18  Yes Panosh, Standley Brooking, MD  nitroGLYCERIN (NITROSTAT) 0.4 MG SL tablet Place 1 tablet (0.4 mg total) under the tongue every 5 (five) minutes as needed. 04/18/14  Yes Panosh, Standley Brooking, MD  simvastatin (ZOCOR) 40 MG tablet TAKE 1 TABLET AT BEDTIME BY MOUTH. Patient taking differently: Take 40 mg by mouth at bedtime.  06/30/18  Yes Panosh, Standley Brooking, MD  allopurinol (ZYLOPRIM) 100 MG  tablet Take 1 tablet (100 mg total) by mouth daily. Patient not taking: Reported on 11/04/2018 01/12/18   Rosemarie Ax, MD  amoxicillin-clavulanate (AUGMENTIN) 875-125 MG tablet Take 1 tablet by mouth every 12 (twelve) hours. For sinusiits Patient not taking: Reported on 11/04/2018 08/28/18   Panosh, Standley Brooking, MD  diclofenac sodium (VOLTAREN) 1 % GEL Apply 2 g topically 4 (four) times daily. Rub into affected area of foot 2 to 4 times daily Patient not taking: Reported on 11/04/2018 04/06/18   Trula Slade, DPM  promethazine (PHENERGAN) 25 MG tablet Take 1 tablet (25 mg total) by mouth every 8 (eight) hours as needed for nausea or vomiting. Patient not taking: Reported on 11/04/2018 03/02/18   Trula Slade, DPM  traMADol (ULTRAM) 50 MG tablet Take 1 tablet (50 mg total) by mouth every 8 (eight) hours as needed. Patient not taking: Reported on 11/04/2018 12/30/17   Burnis Medin, MD    Physical Exam: Vitals:   11/04/18 1200 11/04/18 1300 11/04/18 1301 11/04/18 1402  BP: 136/73 133/69  (!) 132/52  Pulse: 68 69 77 64  Resp: (!) 25 (!) 28 16 (!) 23  Temp:      TempSrc:      SpO2: 97% 92% 95% 94%  Weight:      Height:          Constitutional: Obese pleasant female, sitting up in bed, no distress, AAO x3 Vitals:   11/04/18 1200 11/04/18 1300 11/04/18 1301 11/04/18 1402  BP: 136/73 133/69  (!) 132/52  Pulse: 68 69 77 64  Resp: (!) 25 (!) 28 16 (!) 23  Temp:      TempSrc:      SpO2: 97% 92% 95% 94%  Weight:      Height:       Eyes: PERRL, lids and conjunctivae normal ENMT: Mucous membranes are moist.  Neck: Neck obese, positive JVD Respiratory: Few basilar crackles noted cardiovascular: S1-S2/regular rate rhythm Abdomen: soft, non tender, Bowel sounds positive.  Musculoskeletal: No joint deformity upper and lower extremities. Ext: 1-2+ edema Skin: no rashes, lesions, ulcers.  Neurologic: Moves all extremities, no localizing signs Psychiatric: Normal judgment and  insight. Alert and oriented x 3. Normal mood.   Labs on Admission: I have personally reviewed following labs and imaging studies  CBC: Recent Labs  Lab 11/03/18 0856 11/04/18 1105  WBC 8.3 9.6  NEUTROABS 4.3 6.1  HGB 16.9* 17.4*  HCT 53.1* 57.0*  MCV 87.5 90.3  PLT 264.0 159   Basic Metabolic Panel: Recent Labs  Lab 11/04/18 1105  NA 137  K 4.4  CL 95*  CO2 33*  GLUCOSE 108*  BUN 23  CREATININE 0.88  CALCIUM 8.7*  GFR: Estimated Creatinine Clearance: 59.3 mL/min (by C-G formula based on SCr of 0.88 mg/dL). Liver Function Tests: Recent Labs  Lab 11/04/18 1105  AST 40  ALT 52*  ALKPHOS 115  BILITOT 0.6  PROT 7.4  ALBUMIN 3.6   No results for input(s): LIPASE, AMYLASE in the last 168 hours. No results for input(s): AMMONIA in the last 168 hours. Coagulation Profile: No results for input(s): INR, PROTIME in the last 168 hours. Cardiac Enzymes: No results for input(s): CKTOTAL, CKMB, CKMBINDEX, TROPONINI in the last 168 hours. BNP (last 3 results) No results for input(s): PROBNP in the last 8760 hours. HbA1C: Recent Labs    11/03/18 0856  HGBA1C 7.0*   CBG: No results for input(s): GLUCAP in the last 168 hours. Lipid Profile: No results for input(s): CHOL, HDL, LDLCALC, TRIG, CHOLHDL, LDLDIRECT in the last 72 hours. Thyroid Function Tests: No results for input(s): TSH, T4TOTAL, FREET4, T3FREE, THYROIDAB in the last 72 hours. Anemia Panel: No results for input(s): VITAMINB12, FOLATE, FERRITIN, TIBC, IRON, RETICCTPCT in the last 72 hours. Urine analysis:    Component Value Date/Time   COLORURINE LT. YELLOW 03/13/2010 0826   APPEARANCEUR CLEAR 03/13/2010 0826   LABSPEC 1.025 03/13/2010 0826   PHURINE 5.5 03/13/2010 0826   GLUCOSEU NEGATIVE 03/13/2010 0826   HGBUR negative 03/08/2009 0802   BILIRUBINUR negative 04/14/2013 1057   BILIRUBINUR n 03/31/2012 1124   KETONESUR negative 04/14/2013 1057   KETONESUR NEGATIVE 03/13/2010 0826   PROTEINUR  negative 04/14/2013 1057   PROTEINUR n 03/31/2012 1124   UROBILINOGEN 0.2 04/14/2013 1057   UROBILINOGEN 1.0 03/13/2010 0826   NITRITE Negative 04/14/2013 1057   NITRITE n 03/31/2012 1124   NITRITE NEGATIVE 03/13/2010 0826   LEUKOCYTESUR Negative 04/14/2013 1057   Sepsis Labs: _0 (procalcitonin:4,lacticidven:4) ) Recent Results (from the past 240 hour(s))  SARS Coronavirus 2 (CEPHEID - Performed in [facility] hospital lab), Hosp Order     Status: None   Collection Time: 11/04/18 11:02 AM  Result Value Ref Range Status   SARS Coronavirus 2 NEGATIVE NEGATIVE Final    Comment: (NOTE) If result is NEGATIVE SARS-CoV-2 target nucleic acids are NOT DETECTED. The SARS-CoV-2 RNA is generally detectable in upper and lower  respiratory specimens during the acute phase of infection. The lowest  concentration of SARS-CoV-2 viral copies this assay can detect is 250  copies / mL. A negative result does not preclude SARS-CoV-2 infection  and should not be used as the sole basis for treatment or other  patient management decisions.  A negative result may occur with  improper specimen collection / handling, submission of specimen other  than nasopharyngeal swab, presence of viral mutation(s) within the  areas targeted by this assay, and inadequate number of viral copies  (<250 copies / mL). A negative result must be combined with clinical  observations, patient history, and epidemiological information. If result is POSITIVE SARS-CoV-2 target nucleic acids are DETECTED. The SARS-CoV-2 RNA is generally detectable in upper and lower  respiratory specimens dur ing the acute phase of infection.  Positive  results are indicative of active infection with SARS-CoV-2.  Clinical  correlation with patient history and other diagnostic information is  necessary to determine patient infection status.  Positive results do  not rule out bacterial infection or co-infection with other viruses. If result  is PRESUMPTIVE POSTIVE SARS-CoV-2 nucleic acids MAY BE PRESENT.   A presumptive positive result was obtained on the submitted specimen  and confirmed on repeat testing.  While 2019  novel coronavirus  (SARS-CoV-2) nucleic acids may be present in the submitted sample  additional confirmatory testing may be necessary for epidemiological  and / or clinical management purposes  to differentiate between  SARS-CoV-2 and other Sarbecovirus currently known to infect humans.  If clinically indicated additional testing with an alternate test  methodology [id number]) is advised. The SARS-CoV-2 RNA is generally  detectable in upper and lower respiratory sp ecimens during the acute  phase of infection. The expected result is Negative. Fact Sheet for Patients:  StrictlyIdeas.no Fact Sheet for Healthcare Providers: BankingDealers.co.za This test is not yet approved or cleared by the [place] FDA and has been authorized for detection and/or diagnosis of SARS-CoV-2 by FDA under an Emergency Use Authorization (EUA).  This EUA will remain in effect (meaning this test can be used) for the duration of the COVID-19 declaration under Section 564(b)(1) of the Act, 21 U.S.C. section 360bbb-3(b)(1), unless the authorization is terminated or revoked sooner. Performed at [facility], [place] [address], [place], [place] [address]      Radiological Exams on Admission: Dg Chest 2 View  Result Date: 11/04/2018 CLINICAL DATA:  Shortness of breath. Fall. EXAM: CHEST - 2 VIEW COMPARISON:  Chest radiographs 03/11/2017 and CT 03/17/2017 FINDINGS: The cardiac silhouette is upper limits of normal in size. Aortic atherosclerosis is noted. There is chronic peribronchial thickening with upper lobe predominant emphysema as shown on CT. No acute airspace consolidation, overt pulmonary edema, pleural effusion, pneumothorax is identified. No acute osseous  abnormality is seen. IMPRESSION: COPD without evidence of acute cardiopulmonary process. Electronically Signed   By: Logan Bores M.D.   On: 11/04/2018 11:49   Ct Angio Chest Pe W/cm &/or Wo Cm  Result Date: 11/04/2018 CLINICAL DATA:  Decreased O2 saturations. Shortness of breath for 3 weeks. Bilateral foot and ankle swelling for a month. EXAM: CT ANGIOGRAPHY CHEST WITH CONTRAST TECHNIQUE: Multidetector CT imaging of the chest was performed using the standard protocol during bolus administration of intravenous contrast. Multiplanar CT image reconstructions and MIPs were obtained to evaluate the vascular anatomy. CONTRAST:  165m OMNIPAQUE IOHEXOL 350 MG/ML SOLN COMPARISON:  Chest x-ray November 04, 2018 and chest CT March 17, 2017 FINDINGS: Cardiovascular: Cardiomegaly is identified. There are coronary artery calcifications in the right and left coronary arteries. Atherosclerotic changes seen in the nonaneurysmal aorta without dissection. Evaluation of the pulmonary arteries is limited due to respiratory motion. No pulmonary emboli identified. Mediastinum/Nodes: There is debris within the esophagus. The thyroid is unremarkable. The chest wall is unremarkable. No adenopathy. No effusions. Lungs/Pleura: Respiratory motion limits evaluation. Scattered atelectasis. Emphysematous changes, particularly in the upper lobes. There is bronchial wall thickening in the bases. Central airways are normal. No suspicious nodules, masses, or focal infiltrates. Upper Abdomen: No acute abnormality. Musculoskeletal: No chest wall abnormality. No acute or significant osseous findings. Review of the MIP images confirms the above findings. IMPRESSION: 1. No pulmonary emboli identified. Evaluation of distal pulmonary arterial branches is limited due to respiratory motion. 2. Coronary artery calcifications. 3. Cardiomegaly. 4. Emphysematous changes in the lungs. 5. Atherosclerotic change in the thoracic aorta without aneurysm. Aortic  Atherosclerosis (ICD10-I70.0) and Emphysema (ICD10-J43.9). Electronically Signed   By: DDorise BullionIII M.D   On: 11/04/2018 12:46    EKG: independently reviewed, sinus rhythm, biatrial enlargement noted, Q waves in V2 and V3   Assessment/Plan  Acute CHF -Suspect diastolic, this is new onset -IV Lasix 40 mg every 12 with potassium -Check 2D echocardiogram -Follow I's/O,  daily weights -Monitor electrolytes and creatinine closely -If EF is low may need ischemia work-up  Syncope -suspect secondary to arrhythmia in the setting of fluid overload and CHF exacerbation -Monitor on telemetry, follow-up echo -Also check TSH  History of CAD -Remote PCI/stent to LAD in 2002 -Used to be followed by Dr. Percival Spanish but has not seen him in over 5 to 6 years -Continue metoprolol  DM2 -Check hemoglobin A1c, hold metformin -SSI  Essential hypertension -Continue metoprolol  History of tobacco abuse, emphysematous changes on chest x-ray -Quit smoking, no evidence of COPD exacerbation -Follow-up with pulmonary, needs PFTs  DVT prophylaxis: Lovenox Code Status: Wishes to be DNR Family Communication: No family at bedside Disposition Plan: Home pending above work-up and clinical improvement Consults called: None Admission status: Inpatient  Domenic Polite MD [facility] Pager [phone]  If 7PM-7AM, please contact night-coverage [email or web address] Password [email or web address]  11/04/2018, 2:25 PM

## 2018-11-04 NOTE — Progress Notes (Addendum)
RN paged because pt was SOB with some distress and was newly confused. Lasix 1m IV ordered and NP to bedside. RRRN at bedside.  S: Pt says she did fall over the weekend and hit her head and the bump is smaller now. No HA. Breathing is better after the Lasix. Voiding a lot.  O: Well appearing female in NAD. VSS. Breathing is unlabored. No increased WOB. Neuro is non focal.  A/P: 1. SOB-acute CHF, new onset this hospitalization. Lasix helped. 2. ? New confusion-nothing focal on exam. Given head trauma will get a stat CT head to r/o bleed or fx. CT neg. Will follow.  KJKG, NP [facility] Addendum: Later in shift, pt would wake up confused and sometimes yelling out. ABG showed hypercarbia. Pt moved to SDU for Bipap with f/up ABG in 2 hours. Labs ordered to r/o for confusion of other etiologies-UA, urine culture, BMP. TSH already ordered.  KJKG, NP [facility]

## 2018-11-04 NOTE — Telephone Encounter (Signed)
Pt. Reports she fell at 0500 this morning in her bathroom and hit her head on a wooden cabinet. Has an egg size lump to right side on top of her head. Feels " a little dizzy and off balance." Instructed pt. To go to ED for evaluation. Verbalizes understanding. Reason for Disposition . Large swelling or bruise > 2 inches (5 cm)  Answer Assessment - Initial Assessment Questions 1. MECHANISM: "How did the injury happen?" For falls, ask: "What height did you fall from?" and "What surface did you fall against?"      Fell in bathroom 2. ONSET: "When did the injury happen?" (Minutes or hours ago)      0500 3. NEUROLOGIC SYMPTOMS: "Was there any loss of consciousness?" "Are there any other neurological symptoms?"      No 4. MENTAL STATUS: "Does the person know who he is, who you are, and where he is?"      Alert and oriented 5. LOCATION: "What part of the head was hit?"      Right top 6. SCALP APPEARANCE: "What does the scalp look like? Is it bleeding now?" If so, ask: "Is it difficult to stop?"      No 7. SIZE: For cuts, bruises, or swelling, ask: "How large is it?" (e.g., inches or centimeters)      Egg size lump 8. PAIN: "Is there any pain?" If so, ask: "How bad is it?"  (e.g., Scale 1-10; or mild, moderate, severe)     4 9. TETANUS: For any breaks in the skin, ask: "When was the last tetanus booster?"     Unsure 10. OTHER SYMPTOMS: "Do you have any other symptoms?" (e.g., neck pain, vomiting)       Unbalanced, dizzy 11. PREGNANCY: "Is there any chance you are pregnant?" "When was your last menstrual period?"       No  Protocols used: HEAD INJURY-A-AH

## 2018-11-05 ENCOUNTER — Inpatient Hospital Stay (HOSPITAL_COMMUNITY): Payer: Medicare Other

## 2018-11-05 DIAGNOSIS — J9621 Acute and chronic respiratory failure with hypoxia: Secondary | ICD-10-CM

## 2018-11-05 DIAGNOSIS — E119 Type 2 diabetes mellitus without complications: Secondary | ICD-10-CM

## 2018-11-05 DIAGNOSIS — I5033 Acute on chronic diastolic (congestive) heart failure: Secondary | ICD-10-CM | POA: Diagnosis present

## 2018-11-05 DIAGNOSIS — I10 Essential (primary) hypertension: Secondary | ICD-10-CM

## 2018-11-05 DIAGNOSIS — E6609 Other obesity due to excess calories: Secondary | ICD-10-CM

## 2018-11-05 DIAGNOSIS — Z87891 Personal history of nicotine dependence: Secondary | ICD-10-CM

## 2018-11-05 DIAGNOSIS — Z6836 Body mass index (BMI) 36.0-36.9, adult: Secondary | ICD-10-CM

## 2018-11-05 DIAGNOSIS — J9622 Acute and chronic respiratory failure with hypercapnia: Secondary | ICD-10-CM

## 2018-11-05 DIAGNOSIS — M109 Gout, unspecified: Secondary | ICD-10-CM

## 2018-11-05 DIAGNOSIS — J9611 Chronic respiratory failure with hypoxia: Secondary | ICD-10-CM | POA: Diagnosis present

## 2018-11-05 DIAGNOSIS — I5031 Acute diastolic (congestive) heart failure: Secondary | ICD-10-CM

## 2018-11-05 HISTORY — DX: Acute and chronic respiratory failure with hypercapnia: J96.22

## 2018-11-05 HISTORY — DX: Acute and chronic respiratory failure with hypoxia: J96.21

## 2018-11-05 LAB — URINALYSIS, ROUTINE W REFLEX MICROSCOPIC
Bilirubin Urine: NEGATIVE
Glucose, UA: NEGATIVE mg/dL
Ketones, ur: NEGATIVE mg/dL
Leukocytes,Ua: NEGATIVE
Nitrite: NEGATIVE
Protein, ur: 30 mg/dL — AB
Specific Gravity, Urine: 1.013 (ref 1.005–1.030)
pH: 5 (ref 5.0–8.0)

## 2018-11-05 LAB — MRSA PCR SCREENING: MRSA by PCR: NEGATIVE

## 2018-11-05 LAB — BASIC METABOLIC PANEL
Anion gap: 13 (ref 5–15)
BUN: 21 mg/dL (ref 8–23)
CO2: 38 mmol/L — ABNORMAL HIGH (ref 22–32)
Calcium: 8.7 mg/dL — ABNORMAL LOW (ref 8.9–10.3)
Chloride: 89 mmol/L — ABNORMAL LOW (ref 98–111)
Creatinine, Ser: 1.12 mg/dL — ABNORMAL HIGH (ref 0.44–1.00)
GFR calc Af Amer: 57 mL/min — ABNORMAL LOW (ref >60)
GFR calc non Af Amer: 49 mL/min — ABNORMAL LOW (ref >60)
Glucose, Bld: 103 mg/dL — ABNORMAL HIGH (ref 70–99)
Potassium: 4.9 mmol/L (ref 3.5–5.1)
Sodium: 140 mmol/L (ref 135–145)

## 2018-11-05 LAB — CBC
HCT: 57.7 % — ABNORMAL HIGH (ref 36.0–46.0)
Hemoglobin: 17 g/dL — ABNORMAL HIGH (ref 12.0–15.0)
MCH: 27.7 pg (ref 26.0–34.0)
MCHC: 29.5 g/dL — ABNORMAL LOW (ref 30.0–36.0)
MCV: 94 fL (ref 80.0–100.0)
Platelets: 244 10*3/uL (ref 150–400)
RBC: 6.14 MIL/uL — ABNORMAL HIGH (ref 3.87–5.11)
RDW: 15.9 % — ABNORMAL HIGH (ref 11.5–15.5)
WBC: 9 10*3/uL (ref 4.0–10.5)
nRBC: 0 % (ref 0.0–0.2)

## 2018-11-05 LAB — BLOOD GAS, ARTERIAL
Acid-Base Excess: 7.4 mmol/L — ABNORMAL HIGH (ref 0.0–2.0)
Acid-Base Excess: 12.7 mmol/L — ABNORMAL HIGH (ref 0.0–2.0)
Bicarbonate: 38.5 mmol/L — ABNORMAL HIGH (ref 20.0–28.0)
Bicarbonate: 41.2 mmol/L — ABNORMAL HIGH (ref 20.0–28.0)
Drawn by: 235321
Drawn by: 270211
O2 Content: 5 L/min
O2 Content: 4 L/min
O2 Saturation: 99 %
O2 Saturation: 90.7 %
Patient temperature: 37
Patient temperature: 37
pCO2 arterial: 85.1 mmHg (ref 32.0–48.0)
pCO2 arterial: 66.6 mmHg (ref 32.0–48.0)
pH, Arterial: 7.278 — ABNORMAL LOW (ref 7.350–7.450)
pH, Arterial: 7.407 (ref 7.350–7.450)
pO2, Arterial: 175 mmHg — ABNORMAL HIGH (ref 83.0–108.0)
pO2, Arterial: 59.4 mmHg — ABNORMAL LOW (ref 83.0–108.0)

## 2018-11-05 LAB — GLUCOSE, CAPILLARY
Glucose-Capillary: 120 mg/dL — ABNORMAL HIGH (ref 70–99)
Glucose-Capillary: 100 mg/dL — ABNORMAL HIGH (ref 70–99)
Glucose-Capillary: 76 mg/dL (ref 70–99)
Glucose-Capillary: 103 mg/dL — ABNORMAL HIGH (ref 70–99)
Glucose-Capillary: 67 mg/dL — ABNORMAL LOW (ref 70–99)
Glucose-Capillary: 108 mg/dL — ABNORMAL HIGH (ref 70–99)
Glucose-Capillary: 91 mg/dL (ref 70–99)

## 2018-11-05 LAB — TSH: TSH: 1.266 u[IU]/mL (ref 0.350–4.500)

## 2018-11-05 LAB — ERYTHROPOIETIN: Erythropoietin: 140.3 m[IU]/mL — ABNORMAL HIGH (ref 2.6–18.5)

## 2018-11-05 MED ORDER — DEXTROSE 50 % IV SOLN
INTRAVENOUS | Status: AC
  Administered 2018-11-05: 25 mL
  Filled 2018-11-05: qty 50

## 2018-11-05 MED ORDER — ORAL CARE MOUTH RINSE
15.0000 mL | Freq: Two times a day (BID) | OROMUCOSAL | Status: DC
  Administered 2018-11-05 – 2018-11-09 (×7): 15 mL via OROMUCOSAL

## 2018-11-05 MED ORDER — CHLORHEXIDINE GLUCONATE 0.12 % MT SOLN
15.0000 mL | Freq: Two times a day (BID) | OROMUCOSAL | Status: DC
  Administered 2018-11-05 – 2018-11-11 (×6): 15 mL via OROMUCOSAL
  Filled 2018-11-05 (×10): qty 15

## 2018-11-05 NOTE — Progress Notes (Signed)
Patient moved to step down.

## 2018-11-05 NOTE — Progress Notes (Signed)
Patient confusion worsening, attempting to climb out of bed and yelling for help. Tylene Fantasia paged.

## 2018-11-05 NOTE — Progress Notes (Signed)
PROGRESS NOTE    [name]  SWN:[id number] DOB: 1945-08-22 DOA: 11/04/2018 PCP: Burnis Medin, MD    Brief Narrative:   Rebecca Mcintyre is a 73 y.o. female with medical history significant of CAD, remote stent to LAD in 2002, tobacco abuse, dyslipidemia, obesity presented to the ED with shortness of breath and possible syncopal episode.  Patient reports progressive lower extremity swelling and dyspnea over the past 2 weeks; requiring her to go sleep almost in the upright position.  Also reports associated abdominal tightness.  Early on the morning of admission, roughly 5 AM she remembers walking up to the bathroom, apparently fell and does not recall events and woke up on the floor. She denies any chest pain palpitations, no fevers or chills, no cough congestion etc.  Patient was noted to be hypoxic with sats in the mid 80s which improved with 4 liters of O2, chest x-ray showed COPD changes, CT angiogram was negative for pulmonary embolism.  Patient referred for admission by EDP for further evaluation and treatment of acute hypoxic respiratory failure likely secondary to CHF exacerbation.   Assessment & Plan:   Active Problems:   GOUT   OBESITY   Essential hypertension   TOBACCO ABUSE, HX OF   Pre-diabetes   Acute CHF (congestive heart failure) (HCC)   CHF (congestive heart failure) (HCC)   Type 2 diabetes mellitus without complication (HCC)   Acute hypoxic respiratory failure with hypercapnia Acute on chronic diastolic congestive heart failure Likely underlying obstructive sleep apnea Presenting from home with a 2-week history of progressive dyspnea, lower extremity edema and abdominal fullness/tightness.  BNP on admission elevated at 586.9.  COVID-19 test negative.  D-dimer elevated 0.81.  Chest x-ray with emphysematous changes consistent with COPD.  CT angiogram PE study negative for PE but noted cardiomegaly.  Transthoracic echocardiogram with EF 93-81%, diastolic dysfunction,  RV reduced systolic function, RV systolic pressure elevated consistent with volume overload, IVC dilated. --BG, pH 7.27, PaCO2 85, PaO2 175, HCO3 38.5 --net negative 3.01L past 24h  --Continue BiPAP --Furosemide 40 mg IV every 12 hours --Daily weights and strict I's and O's --Low-salt diet, consistent carbohydrate --Supportive care  Confusion Syncopal episode Patient reported syncopal episode while going to the bathroom prior to presentation.  Patient has been more confused per daughter.  Etiology likely from hypoxia which she is experiencing over the past 2 weeks with etiology likely underlying acute on chronic congestive heart failure.  CT head without contrast negative for acute intracranial pathology, did note left posterior scalp swelling without underlying fracture.  ABG on admission noted with elevated PaCO2 of 85.  Echo with normal EF.  CTPA negative for pulmonary embolism.  TSH within normal limits at 1.266. --Continue BiPAP as above --Supportive care  Type 2 diabetes mellitus Hemoglobin A1c 7.0 on 11/03/2018, well controlled.  Home regimen includes metformin 500 mg p.o. twice daily. --Hold oral hypoglycemics while inpatient --Consistent carbohydrate diet --insulin Sliding scale for coverage  Suspect underlying COPD Hx tobacco abuse disorder History of significant tobacco abuse.  Chest x-ray findings suggestive of emphysematous changes consistent with underlying COPD.  No PFTs available for review in EMR.  Patient on montelukast 10 mg p.o. daily and albuterol inhaler as needed at home. --continue nebs as needed  --Counseled on tobacco cessation --Would benefit from pulmonary outpatient follow-up with PFTs  Morbid obesity BMI 36.75.  Patient will need aggressive weight loss measures in the future as this complicates all facets of care  Hx CAD  Remote LAD stent in 2002. --Continue aspirin, metoprolol and statin   DVT prophylaxis: Lovenox Code Status: Full code Family  Communication: Updated daughter [name] by telephone this am regarding events overnight, plan of care. Disposition Plan: Inpatient, further dependent on clinical course, remains on BiPAP   Consultants:   none  Procedures:   BiPAP 6/11>>  Antimicrobials:  none   Subjective:  Patient seen and examined at bedside, currently on BiPAP.  Overnight, rapid response for further confusion and combativeness.  Patient was found to have an elevated PaCO2 of 85.  She was transferred from the floor to stepdown unit and placed on BiPAP.  Patient with significant volume overload secondary to acute on chronic diastolic congestive heart failure, currently diuresing well with net -3 L past 24 hours.  Patient currently resting comfortably on BiPAP.  No other concerns overnight per nursing staff.  Updated daughter via telephone this morning regarding events overnight and plan of care.  Objective: Vitals:   11/05/18 0800 11/05/18 0834 11/05/18 0900 11/05/18 1000  BP: 117/66  (!) 133/58 (!) 151/66  Pulse: 81  80 85  Resp: _0 Temp: (!) 97.2 F (36.2 C)     TempSrc: Axillary     SpO2: 97% 98% 96% 93%  Weight:      Height:        Intake/Output Summary (Last 24 hours) at 11/05/2018 1112 Last data filed at 11/05/2018 0233 Gross per 24 hour  Intake 240 ml  Output 3250 ml  Net -3010 ml   Filed Weights   11/04/18 1016 11/05/18 0500 11/05/18 0650  Weight: 83.9 kg 94.5 kg 94.1 kg    Examination:  General exam: Appears calm and comfortable, on BiPAP Respiratory system: Coarse breath sounds bilaterally, slightly decreased bilateral bases, slightly increased respiratory effort, currently on BiPAP at 35% FiO2 Cardiovascular system: S1 & S2 heard, RRR. No JVD, murmurs, rubs, gallops or clicks.  2+ pitting edema up to knee bilaterally Gastrointestinal system: Abdomen is protuberant, nondistended, soft and nontender. No organomegaly or masses felt. Normal bowel sounds heard. Central nervous system:  Alert, No focal neurological deficits. Extremities: Symmetric 5 x 5 power. Skin: No rashes, lesions or ulcers    Data Reviewed: I have personally reviewed following labs and imaging studies  CBC: Recent Labs  Lab 11/03/18 0856 11/04/18 1105 11/05/18 0928  WBC 8.3 9.6 9.0  NEUTROABS 4.3 6.1  --   HGB 16.9* 17.4* 17.0*  HCT 53.1* 57.0* 57.7*  MCV 87.5 90.3 94.0  PLT 264.0 274 435   Basic Metabolic Panel: Recent Labs  Lab 11/04/18 1105 11/05/18 0928  NA 137 140  K 4.4 4.9  CL 95* 89*  CO2 33* 38*  GLUCOSE 108* 103*  BUN 23 21  CREATININE 0.88 1.12*  CALCIUM 8.7* 8.7*   GFR: Estimated Creatinine Clearance: 49.5 mL/min (A) (by C-G formula based on SCr of 1.12 mg/dL (H)). Liver Function Tests: Recent Labs  Lab 11/04/18 1105  AST 40  ALT 52*  ALKPHOS 115  BILITOT 0.6  PROT 7.4  ALBUMIN 3.6   No results for input(s): LIPASE, AMYLASE in the last 168 hours. No results for input(s): AMMONIA in the last 168 hours. Coagulation Profile: No results for input(s): INR, PROTIME in the last 168 hours. Cardiac Enzymes: No results for input(s): CKTOTAL, CKMB, CKMBINDEX, TROPONINI in the last 168 hours. BNP (last 3 results) No results for input(s): PROBNP in the last 8760 hours. HbA1C: Recent Labs    11/03/18 0856  HGBA1C 7.0*   CBG: Recent Labs  Lab 11/04/18 1804 11/04/18 2120 11/05/18 0141 11/05/18 0810  GLUCAP 75 135* 120* 100*   Lipid Profile: No results for input(s): CHOL, HDL, LDLCALC, TRIG, CHOLHDL, LDLDIRECT in the last 72 hours. Thyroid Function Tests: Recent Labs    11/05/18 0928  TSH 1.266   Anemia Panel: No results for input(s): VITAMINB12, FOLATE, FERRITIN, TIBC, IRON, RETICCTPCT in the last 72 hours. Sepsis Labs: No results for input(s): PROCALCITON, LATICACIDVEN in the last 168 hours.  Recent Results (from the past 240 hour(s))  SARS Coronavirus 2 (CEPHEID - Performed in [facility] hospital lab), Hosp Order     Status: None   Collection  Time: 11/04/18 11:02 AM   Specimen: Nasopharyngeal Swab  Result Value Ref Range Status   SARS Coronavirus 2 NEGATIVE NEGATIVE Final    Comment: (NOTE) If result is NEGATIVE SARS-CoV-2 target nucleic acids are NOT DETECTED. The SARS-CoV-2 RNA is generally detectable in upper and lower  respiratory specimens during the acute phase of infection. The lowest  concentration of SARS-CoV-2 viral copies this assay can detect is 250  copies / mL. A negative result does not preclude SARS-CoV-2 infection  and should not be used as the sole basis for treatment or other  patient management decisions.  A negative result may occur with  improper specimen collection / handling, submission of specimen other  than nasopharyngeal swab, presence of viral mutation(s) within the  areas targeted by this assay, and inadequate number of viral copies  (<250 copies / mL). A negative result must be combined with clinical  observations, patient history, and epidemiological information. If result is POSITIVE SARS-CoV-2 target nucleic acids are DETECTED. The SARS-CoV-2 RNA is generally detectable in upper and lower  respiratory specimens dur ing the acute phase of infection.  Positive  results are indicative of active infection with SARS-CoV-2.  Clinical  correlation with patient history and other diagnostic information is  necessary to determine patient infection status.  Positive results do  not rule out bacterial infection or co-infection with other viruses. If result is PRESUMPTIVE POSTIVE SARS-CoV-2 nucleic acids MAY BE PRESENT.   A presumptive positive result was obtained on the submitted specimen  and confirmed on repeat testing.  While 2019 novel coronavirus  (SARS-CoV-2) nucleic acids may be present in the submitted sample  additional confirmatory testing may be necessary for epidemiological  and / or clinical management purposes  to differentiate between  SARS-CoV-2 and other Sarbecovirus currently known  to infect humans.  If clinically indicated additional testing with an alternate test  methodology [phone]) is advised. The SARS-CoV-2 RNA is generally  detectable in upper and lower respiratory sp ecimens during the acute  phase of infection. The expected result is Negative. Fact Sheet for Patients:  StrictlyIdeas.no Fact Sheet for Healthcare Providers: BankingDealers.co.za This test is not yet approved or cleared by the [place] FDA and has been authorized for detection and/or diagnosis of SARS-CoV-2 by FDA under an Emergency Use Authorization (EUA).  This EUA will remain in effect (meaning this test can be used) for the duration of the COVID-19 declaration under Section 564(b)(1) of the Act, 21 U.S.C. section 360bbb-3(b)(1), unless the authorization is terminated or revoked sooner. Performed at [facility], [place] [address] [place], [place] [address]   MRSA PCR Screening     Status: None   Collection Time: 11/05/18  6:52 AM   Specimen: Nasal Mucosa; Nasopharyngeal  Result Value Ref Range Status   MRSA  by PCR NEGATIVE NEGATIVE Final    Comment:        The GeneXpert MRSA Assay (FDA approved for NASAL specimens only), is one component of a comprehensive MRSA colonization surveillance program. It is not intended to diagnose MRSA infection nor to guide or monitor treatment for MRSA infections. Performed at [facility], [place] [address] [place], [place] [address]          Radiology Studies: Dg Chest 2 View  Result Date: 11/04/2018 CLINICAL DATA:  Shortness of breath. Fall. EXAM: CHEST - 2 VIEW COMPARISON:  Chest radiographs 03/11/2017 and CT 03/17/2017 FINDINGS: The cardiac silhouette is upper limits of normal in size. Aortic atherosclerosis is noted. There is chronic peribronchial thickening with upper lobe predominant emphysema as shown on CT. No acute airspace consolidation, overt  pulmonary edema, pleural effusion, pneumothorax is identified. No acute osseous abnormality is seen. IMPRESSION: COPD without evidence of acute cardiopulmonary process. Electronically Signed   By: Logan Bores M.D.   On: 11/04/2018 11:49   Ct Head Wo Contrast  Result Date: 11/04/2018 CLINICAL DATA:  Pain status post fall. EXAM: CT HEAD WITHOUT CONTRAST TECHNIQUE: Contiguous axial images were obtained from the base of the skull through the vertex without intravenous contrast. COMPARISON:  None. FINDINGS: Brain: No evidence of acute infarction, hemorrhage, hydrocephalus, extra-axial collection or mass lesion/mass effect. The cerebellar tonsils are somewhat low riding, however this is not well visualized on this exam. Vascular: No hyperdense vessel or unexpected calcification. Skull: Normal. Negative for fracture or focal lesion. Posterior scalp swelling is noted. Sinuses/Orbits: No acute finding. Other: None. IMPRESSION: 1. No acute intracranial abnormality detected. 2. Left posterior scalp swelling without evidence of an underlying fracture. Electronically Signed   By: Constance Holster M.D.   On: 11/04/2018 23:28   Ct Angio Chest Pe W/cm &/or Wo Cm  Result Date: 11/04/2018 CLINICAL DATA:  Decreased O2 saturations. Shortness of breath for 3 weeks. Bilateral foot and ankle swelling for a month. EXAM: CT ANGIOGRAPHY CHEST WITH CONTRAST TECHNIQUE: Multidetector CT imaging of the chest was performed using the standard protocol during bolus administration of intravenous contrast. Multiplanar CT image reconstructions and MIPs were obtained to evaluate the vascular anatomy. CONTRAST:  154m OMNIPAQUE IOHEXOL 350 MG/ML SOLN COMPARISON:  Chest x-ray November 04, 2018 and chest CT March 17, 2017 FINDINGS: Cardiovascular: Cardiomegaly is identified. There are coronary artery calcifications in the right and left coronary arteries. Atherosclerotic changes seen in the nonaneurysmal aorta without dissection. Evaluation of  the pulmonary arteries is limited due to respiratory motion. No pulmonary emboli identified. Mediastinum/Nodes: There is debris within the esophagus. The thyroid is unremarkable. The chest wall is unremarkable. No adenopathy. No effusions. Lungs/Pleura: Respiratory motion limits evaluation. Scattered atelectasis. Emphysematous changes, particularly in the upper lobes. There is bronchial wall thickening in the bases. Central airways are normal. No suspicious nodules, masses, or focal infiltrates. Upper Abdomen: No acute abnormality. Musculoskeletal: No chest wall abnormality. No acute or significant osseous findings. Review of the MIP images confirms the above findings. IMPRESSION: 1. No pulmonary emboli identified. Evaluation of distal pulmonary arterial branches is limited due to respiratory motion. 2. Coronary artery calcifications. 3. Cardiomegaly. 4. Emphysematous changes in the lungs. 5. Atherosclerotic change in the thoracic aorta without aneurysm. Aortic Atherosclerosis (ICD10-I70.0) and Emphysema (ICD10-J43.9). Electronically Signed   By: DDorise BullionIII M.D   On: 11/04/2018 12:46   Dg Chest Port 1 View  Result Date: 11/05/2018 CLINICAL DATA:  Hypoxia.  Shortness of breath. EXAM:  PORTABLE CHEST 1 VIEW COMPARISON:  Two-view chest x-ray 11/04/2018.  CTA chest 11/04/2018 FINDINGS: The heart is enlarged. Atherosclerotic changes are again noted at the aortic arch. Pulmonary vascular congestion and mild diffuse edema has increased. Increasing bibasilar airspace opacities are present. The visualized soft tissues and bony thorax are unremarkable. IMPRESSION: 1. Increasing interstitial and airspace disease likely reflecting edema and congestive heart failure. 2. Bibasilar airspace disease likely reflects atelectasis. Infection is not excluded. Electronically Signed   By: San Morelle M.D.   On: 11/05/2018 07:15        Scheduled Meds: . allopurinol  100 mg Oral Daily  . aspirin EC  81 mg Oral  Daily  . chlorhexidine  15 mL Mouth Rinse BID  . enoxaparin (LOVENOX) injection  40 mg Subcutaneous Q24H  . furosemide  40 mg Intravenous BID  . insulin aspart  0-9 Units Subcutaneous TID WC  . ipratropium-albuterol  3 mL Nebulization TID  . mouth rinse  15 mL Mouth Rinse q12n4p  . metoprolol tartrate  12.5 mg Oral BID  . montelukast  10 mg Oral QHS  . potassium chloride  40 mEq Oral BID  . simvastatin  40 mg Oral QHS   Continuous Infusions:   LOS: 1 day    Critical Care Time Upon my evaluation, this patient had a high probability of imminent or life-threatening deterioration due to acute hypoxic respiratory failure with hypercapnia and acute on chronic diastolic congestive heart failure which required my direct attention, intervention, and personal management.  I have personally provided 39 minutes of critical care time exclusive of my time spent on separately billable procedures.  Time includes review of laboratory data, radiology results, discussion with consultants, and monitoring for potential decompensation.     Kayleigh Broadwell J [place] DO [facility] Pager [phone]  If 7PM-7AM, please contact night-coverage [email or web address] Password [email or web address] 11/05/2018, 11:12 AM

## 2018-11-05 NOTE — Progress Notes (Addendum)
Per MD verbal Order (Dr. [place] hold all PO meds while patient on BIPAP.

## 2018-11-05 NOTE — Progress Notes (Signed)
Patient confused, thrashing wildly in bed, yelling she can't breathe and saying she "feels like she is dying". Sats 83% on 3 liters. Rapid response called.

## 2018-11-05 NOTE — Plan of Care (Signed)
Plan of care reviewed.

## 2018-11-06 ENCOUNTER — Inpatient Hospital Stay (HOSPITAL_COMMUNITY): Payer: Medicare Other

## 2018-11-06 LAB — GLUCOSE, CAPILLARY
Glucose-Capillary: 91 mg/dL (ref 70–99)
Glucose-Capillary: 89 mg/dL (ref 70–99)
Glucose-Capillary: 117 mg/dL — ABNORMAL HIGH (ref 70–99)
Glucose-Capillary: 116 mg/dL — ABNORMAL HIGH (ref 70–99)

## 2018-11-06 LAB — URINE CULTURE: Culture: NO GROWTH

## 2018-11-06 LAB — BASIC METABOLIC PANEL
Anion gap: 14 (ref 5–15)
BUN: 27 mg/dL — ABNORMAL HIGH (ref 8–23)
CO2: 37 mmol/L — ABNORMAL HIGH (ref 22–32)
Calcium: 8.4 mg/dL — ABNORMAL LOW (ref 8.9–10.3)
Chloride: 86 mmol/L — ABNORMAL LOW (ref 98–111)
Creatinine, Ser: 0.92 mg/dL (ref 0.44–1.00)
GFR calc Af Amer: >60 mL/min (ref >60)
GFR calc non Af Amer: >60 mL/min (ref >60)
Glucose, Bld: 98 mg/dL (ref 70–99)
Potassium: 4 mmol/L (ref 3.5–5.1)
Sodium: 137 mmol/L (ref 135–145)

## 2018-11-06 LAB — CBC
HCT: 55.4 % — ABNORMAL HIGH (ref 36.0–46.0)
Hemoglobin: 16.4 g/dL — ABNORMAL HIGH (ref 12.0–15.0)
MCH: 27 pg (ref 26.0–34.0)
MCHC: 29.6 g/dL — ABNORMAL LOW (ref 30.0–36.0)
MCV: 91.3 fL (ref 80.0–100.0)
Platelets: 226 10*3/uL (ref 150–400)
RBC: 6.07 MIL/uL — ABNORMAL HIGH (ref 3.87–5.11)
RDW: 15.5 % (ref 11.5–15.5)
WBC: 9.3 10*3/uL (ref 4.0–10.5)
nRBC: 0 % (ref 0.0–0.2)

## 2018-11-06 LAB — MAGNESIUM: Magnesium: 2.2 mg/dL (ref 1.7–2.4)

## 2018-11-06 MED ORDER — NICOTINE 21 MG/24HR TD PT24
21.0000 mg | MEDICATED_PATCH | Freq: Every day | TRANSDERMAL | Status: DC
  Administered 2018-11-06 – 2018-11-12 (×7): 21 mg via TRANSDERMAL
  Filled 2018-11-06 (×7): qty 1

## 2018-11-06 MED ORDER — CHLORHEXIDINE GLUCONATE CLOTH 2 % EX PADS
6.0000 | MEDICATED_PAD | Freq: Every day | CUTANEOUS | Status: DC
  Administered 2018-11-06 – 2018-11-07 (×2): 6 via TOPICAL

## 2018-11-06 MED ORDER — FUROSEMIDE 10 MG/ML IJ SOLN
40.0000 mg | Freq: Three times a day (TID) | INTRAMUSCULAR | Status: DC
  Administered 2018-11-06 – 2018-11-12 (×19): 40 mg via INTRAVENOUS
  Filled 2018-11-06 (×19): qty 4

## 2018-11-06 NOTE — Progress Notes (Signed)
PROGRESS NOTE    Rebecca Mcintyre  KDX:[id number] DOB: 12-30-1945 DOA: 11/04/2018 PCP: Burnis Medin, MD    Brief Narrative:   Rebecca Mcintyre is a 73 y.o. female with medical history significant of CAD, remote stent to LAD in 2002, tobacco abuse, dyslipidemia, obesity presented to the ED with shortness of breath and possible syncopal episode.  Patient reports progressive lower extremity swelling and dyspnea over the past 2 weeks; requiring her to go sleep almost in the upright position.  Also reports associated abdominal tightness.  Early on the morning of admission, roughly 5 AM she remembers walking up to the bathroom, apparently fell and does not recall events and woke up on the floor. She denies any chest pain palpitations, no fevers or chills, no cough congestion etc.  Patient was noted to be hypoxic with sats in the mid 80s which improved with 4 liters of O2, chest x-ray showed COPD changes, CT angiogram was negative for pulmonary embolism.  Patient referred for admission by EDP for further evaluation and treatment of acute hypoxic respiratory failure likely secondary to CHF exacerbation.   Assessment & Plan:   Principal Problem:   Acute respiratory failure with hypoxia and hypercapnia (HCC) Active Problems:   GOUT   OBESITY   Essential hypertension   TOBACCO ABUSE, HX OF   Pre-diabetes   Acute CHF (congestive heart failure) (HCC)   CHF (congestive heart failure) (HCC)   Type 2 diabetes mellitus without complication (HCC)   Acute on chronic diastolic CHF (congestive heart failure) (HCC)   Acute hypoxic respiratory failure with hypercapnia Acute on chronic diastolic congestive heart failure Likely underlying obstructive sleep apnea Presenting from home with a 2-week history of progressive dyspnea, lower extremity edema and abdominal fullness/tightness.  BNP on admission elevated at 586.9.  COVID-19 test negative.  D-dimer elevated 0.81.  Chest x-ray with emphysematous changes  consistent with COPD.  CT angiogram PE study negative for PE but noted cardiomegaly.  Transthoracic echocardiogram with EF 97-67%, diastolic dysfunction, RV reduced systolic function, RV systolic pressure elevated consistent with volume overload, IVC dilated. ABG, pH 7.27, PaCO2 85, PaO2 175, HCO3 38.5.  --Slowly improving, off BiPAP this morning on 4 L nasal cannula --net negative 3.350L past 24h with 2 unmeasured urine occurrences --Continue BiPAP prn and qHS --Furosemide 40 mg IV q8h --Daily weights and strict I's and O's --Low-salt diet, consistent carbohydrate --Supportive care  Confusion Syncopal episode Patient reported syncopal episode while going to the bathroom prior to presentation.  Patient has been more confused per daughter.  Etiology likely from hypoxia which she is experiencing over the past 2 weeks with etiology likely underlying acute on chronic congestive heart failure.  CT head without contrast negative for acute intracranial pathology, did note left posterior scalp swelling without underlying fracture.  ABG on admission noted with elevated PaCO2 of 85.  Echo with normal EF.  CTPA negative for pulmonary embolism.  TSH within normal limits at 1.266. --Slowly improving --Continue BiPAP prn as above --Supportive care  Type 2 diabetes mellitus Hemoglobin A1c 7.0 on 11/03/2018, well controlled.  Home regimen includes metformin 500 mg p.o. twice daily. --Hold oral hypoglycemics while inpatient --Consistent carbohydrate diet --insulin Sliding scale for coverage  Suspect underlying COPD Hx tobacco abuse disorder History of significant tobacco abuse.  Chest x-ray findings suggestive of emphysematous changes consistent with underlying COPD.  No PFTs available for review in EMR.  Patient on montelukast 10 mg p.o. daily and albuterol inhaler as needed at home. --  continue nebs as needed  --Counseled on tobacco cessation --Would benefit from pulmonary outpatient follow-up with  PFTs --Nicotine patch  Morbid obesity BMI 36.75.  Patient will need aggressive weight loss measures in the future as this complicates all facets of care  Hx CAD Remote LAD stent in 2002. --Continue aspirin, metoprolol and statin  Weakness/debility: --PT consult   DVT prophylaxis: Lovenox Code Status: Full code Family Communication: Updated daughter Rebecca Mcintyre by telephone this am about plan of care Disposition Plan: Inpatient SDU, further dependent on clinical course, continues to need BiPAP as needed   Consultants:   none  Procedures:   BiPAP 6/11>>  Antimicrobials:  none   Subjective:  Patient seen and examined at bedside, resting comfortably.  Taken off BiPAP early this morning at 6 AM now on nasal cannula 4 L/min.  Confusion much improved.  Continues with moderate shortness of breath, but much improved.  Still cannot recall events leading to hospitalization.  No specific complaints this morning.  Discussed with daughter over telephone regarding plan of care today.  Denies headache, no fever/chills/night sweats, no nausea/vomiting/diarrhea, no chest pain, no palpitations, no abdominal pain, no cough/congestion, no paresthesias.  No acute events overnight per nursing staff.  Objective: Vitals:   11/06/18 0804 11/06/18 0900 11/06/18 1000 11/06/18 1100  BP: 102/63     Pulse: 84 95 84 80  Resp: 19 (!) 26 (!) 24 (!) 25  Temp:      TempSrc:      SpO2: 94% 97% 91% 93%  Weight:      Height:        Intake/Output Summary (Last 24 hours) at 11/06/2018 1143 Last data filed at 11/06/2018 1000 Gross per 24 hour  Intake 1200 ml  Output 1300 ml  Net -100 ml   Filed Weights   11/05/18 0500 11/05/18 0650 11/06/18 0500  Weight: 94.5 kg 94.1 kg 92.2 kg    Examination:  General exam: Appears calm and comfortable, on 4 L nasal cannula Respiratory system: Coarse breath sounds bilaterally, slightly decreased bilateral bases, normal respiratory effort, on 4 L nasal  cannula Cardiovascular system: S1 & S2 heard, RRR. No JVD, murmurs, rubs, gallops or clicks.  2+ pitting edema up to knee bilaterally Gastrointestinal system: Abdomen is protuberant, nondistended, soft and nontender. No organomegaly or masses felt. Normal bowel sounds heard. Central nervous system: Alert, No focal neurological deficits. Extremities: Symmetric 5 x 5 power. Skin: No rashes, lesions or ulcers    Data Reviewed: I have personally reviewed following labs and imaging studies  CBC: Recent Labs  Lab 11/03/18 0856 11/04/18 1105 11/05/18 0928 11/06/18 0307  WBC 8.3 9.6 9.0 9.3  NEUTROABS 4.3 6.1  --   --   HGB 16.9* 17.4* 17.0* 16.4*  HCT 53.1* 57.0* 57.7* 55.4*  MCV 87.5 90.3 94.0 91.3  PLT 264.0 274 244 672   Basic Metabolic Panel: Recent Labs  Lab 11/04/18 1105 11/05/18 0928 11/06/18 0307  NA 137 140 137  K 4.4 4.9 4.0  CL 95* 89* 86*  CO2 33* 38* 37*  GLUCOSE 108* 103* 98  BUN 23 21 27*  CREATININE 0.88 1.12* 0.92  CALCIUM 8.7* 8.7* 8.4*  MG  --   --  2.2   GFR: Estimated Creatinine Clearance: 59.6 mL/min (by C-G formula based on SCr of 0.92 mg/dL). Liver Function Tests: Recent Labs  Lab 11/04/18 1105  AST 40  ALT 52*  ALKPHOS 115  BILITOT 0.6  PROT 7.4  ALBUMIN 3.6   No results  for input(s): LIPASE, AMYLASE in the last 168 hours. No results for input(s): AMMONIA in the last 168 hours. Coagulation Profile: No results for input(s): INR, PROTIME in the last 168 hours. Cardiac Enzymes: No results for input(s): CKTOTAL, CKMB, CKMBINDEX, TROPONINI in the last 168 hours. BNP (last 3 results) No results for input(s): PROBNP in the last 8760 hours. HbA1C: No results for input(s): HGBA1C in the last 72 hours. CBG: Recent Labs  Lab 11/05/18 1332 11/05/18 1407 11/05/18 1538 11/05/18 2151 11/06/18 0810  GLUCAP 67* 103* 108* 91 91   Lipid Profile: No results for input(s): CHOL, HDL, LDLCALC, TRIG, CHOLHDL, LDLDIRECT in the last 72  hours. Thyroid Function Tests: Recent Labs    11/05/18 0928  TSH 1.266   Anemia Panel: No results for input(s): VITAMINB12, FOLATE, FERRITIN, TIBC, IRON, RETICCTPCT in the last 72 hours. Sepsis Labs: No results for input(s): PROCALCITON, LATICACIDVEN in the last 168 hours.  Recent Results (from the past 240 hour(s))  SARS Coronavirus 2 (CEPHEID - Performed in [place] hospital lab), Hosp Order     Status: None   Collection Time: 11/04/18 11:02 AM   Specimen: Nasopharyngeal Swab  Result Value Ref Range Status   SARS Coronavirus 2 NEGATIVE NEGATIVE Final    Comment: (NOTE) If result is NEGATIVE SARS-CoV-2 target nucleic acids are NOT DETECTED. The SARS-CoV-2 RNA is generally detectable in upper and lower  respiratory specimens during the acute phase of infection. The lowest  concentration of SARS-CoV-2 viral copies this assay can detect is 250  copies / mL. A negative result does not preclude SARS-CoV-2 infection  and should not be used as the sole basis for treatment or other  patient management decisions.  A negative result may occur with  improper specimen collection / handling, submission of specimen other  than nasopharyngeal swab, presence of viral mutation(s) within the  areas targeted by this assay, and inadequate number of viral copies  (<250 copies / mL). A negative result must be combined with clinical  observations, patient history, and epidemiological information. If result is POSITIVE SARS-CoV-2 target nucleic acids are DETECTED. The SARS-CoV-2 RNA is generally detectable in upper and lower  respiratory specimens dur ing the acute phase of infection.  Positive  results are indicative of active infection with SARS-CoV-2.  Clinical  correlation with patient history and other diagnostic information is  necessary to determine patient infection status.  Positive results do  not rule out bacterial infection or co-infection with other viruses. If result is PRESUMPTIVE  POSTIVE SARS-CoV-2 nucleic acids MAY BE PRESENT.   A presumptive positive result was obtained on the submitted specimen  and confirmed on repeat testing.  While 2019 novel coronavirus  (SARS-CoV-2) nucleic acids may be present in the submitted sample  additional confirmatory testing may be necessary for epidemiological  and / or clinical management purposes  to differentiate between  SARS-CoV-2 and other Sarbecovirus currently known to infect humans.  If clinically indicated additional testing with an alternate test  methodology [phone]) is advised. The SARS-CoV-2 RNA is generally  detectable in upper and lower respiratory sp ecimens during the acute  phase of infection. The expected result is Negative. Fact Sheet for Patients:  StrictlyIdeas.no Fact Sheet for Healthcare Providers: BankingDealers.co.za This test is not yet approved or cleared by the [place] FDA and has been authorized for detection and/or diagnosis of SARS-CoV-2 by FDA under an Emergency Use Authorization (EUA).  This EUA will remain in effect (meaning this test can be used) for  the duration of the COVID-19 declaration under Section 564(b)(1) of the Act, 21 U.S.C. section 360bbb-3(b)(1), unless the authorization is terminated or revoked sooner. Performed at [facility], [place] [address], [place], [place] [address]   Culture, Urine     Status: None   Collection Time: 11/05/18  5:56 AM   Specimen: Urine, Catheterized  Result Value Ref Range Status   Specimen Description   Final    URINE, CATHETERIZED Performed at [place] [address], [place], [place] [address]    Special Requests   Final    NONE Performed at [facility], [place] [address], [place], [place] [address]    Culture   Final    NO GROWTH Performed at [facility], [place] [address], [place], [place] [address]    Report Status  11/06/2018 FINAL  Final  MRSA PCR Screening     Status: None   Collection Time: 11/05/18  6:52 AM   Specimen: Nasal Mucosa; Nasopharyngeal  Result Value Ref Range Status   MRSA by PCR NEGATIVE NEGATIVE Final    Comment:        The GeneXpert MRSA Assay (FDA approved for NASAL specimens only), is one component of a comprehensive MRSA colonization surveillance program. It is not intended to diagnose MRSA infection nor to guide or monitor treatment for MRSA infections. Performed at [facility], [place] [address] [place], [place] [address]          Radiology Studies: Ct Head Wo Contrast  Result Date: 11/04/2018 CLINICAL DATA:  Pain status post fall. EXAM: CT HEAD WITHOUT CONTRAST TECHNIQUE: Contiguous axial images were obtained from the base of the skull through the vertex without intravenous contrast. COMPARISON:  None. FINDINGS: Brain: No evidence of acute infarction, hemorrhage, hydrocephalus, extra-axial collection or mass lesion/mass effect. The cerebellar tonsils are somewhat low riding, however this is not well visualized on this exam. Vascular: No hyperdense vessel or unexpected calcification. Skull: Normal. Negative for fracture or focal lesion. Posterior scalp swelling is noted. Sinuses/Orbits: No acute finding. Other: None. IMPRESSION: 1. No acute intracranial abnormality detected. 2. Left posterior scalp swelling without evidence of an underlying fracture. Electronically Signed   By: Constance Holster M.D.   On: 11/04/2018 23:28   Ct Angio Chest Pe W/cm &/or Wo Cm  Result Date: 11/04/2018 CLINICAL DATA:  Decreased O2 saturations. Shortness of breath for 3 weeks. Bilateral foot and ankle swelling for a month. EXAM: CT ANGIOGRAPHY CHEST WITH CONTRAST TECHNIQUE: Multidetector CT imaging of the chest was performed using the standard protocol during bolus administration of intravenous contrast. Multiplanar CT image reconstructions and MIPs were obtained to evaluate  the vascular anatomy. CONTRAST:  177m OMNIPAQUE IOHEXOL 350 MG/ML SOLN COMPARISON:  Chest x-ray November 04, 2018 and chest CT March 17, 2017 FINDINGS: Cardiovascular: Cardiomegaly is identified. There are coronary artery calcifications in the right and left coronary arteries. Atherosclerotic changes seen in the nonaneurysmal aorta without dissection. Evaluation of the pulmonary arteries is limited due to respiratory motion. No pulmonary emboli identified. Mediastinum/Nodes: There is debris within the esophagus. The thyroid is unremarkable. The chest wall is unremarkable. No adenopathy. No effusions. Lungs/Pleura: Respiratory motion limits evaluation. Scattered atelectasis. Emphysematous changes, particularly in the upper lobes. There is bronchial wall thickening in the bases. Central airways are normal. No suspicious nodules, masses, or focal infiltrates. Upper Abdomen: No acute abnormality. Musculoskeletal: No chest wall abnormality. No acute or significant osseous findings. Review of the MIP images  confirms the above findings. IMPRESSION: 1. No pulmonary emboli identified. Evaluation of distal pulmonary arterial branches is limited due to respiratory motion. 2. Coronary artery calcifications. 3. Cardiomegaly. 4. Emphysematous changes in the lungs. 5. Atherosclerotic change in the thoracic aorta without aneurysm. Aortic Atherosclerosis (ICD10-I70.0) and Emphysema (ICD10-J43.9). Electronically Signed   By: Dorise Bullion III M.D   On: 11/04/2018 12:46   Dg Chest Port 1 View  Result Date: 11/06/2018 CLINICAL DATA:  Shortness of breath EXAM: PORTABLE CHEST 1 VIEW COMPARISON:  11/05/2018 FINDINGS: Cardiac shadow is stable. Aortic calcifications are again noted. Mild interstitial changes are noted but slightly improved when compare with the prior exam consistent with improving CHF. No focal infiltrate is seen. No effusion is noted. IMPRESSION: Improving CHF. Electronically Signed   By: Inez Catalina M.D.   On:  11/06/2018 07:48   Dg Chest Port 1 View  Result Date: 11/05/2018 CLINICAL DATA:  Hypoxia.  Shortness of breath. EXAM: PORTABLE CHEST 1 VIEW COMPARISON:  Two-view chest x-ray 11/04/2018.  CTA chest 11/04/2018 FINDINGS: The heart is enlarged. Atherosclerotic changes are again noted at the aortic arch. Pulmonary vascular congestion and mild diffuse edema has increased. Increasing bibasilar airspace opacities are present. The visualized soft tissues and bony thorax are unremarkable. IMPRESSION: 1. Increasing interstitial and airspace disease likely reflecting edema and congestive heart failure. 2. Bibasilar airspace disease likely reflects atelectasis. Infection is not excluded. Electronically Signed   By: San Morelle M.D.   On: 11/05/2018 07:15        Scheduled Meds: . allopurinol  100 mg Oral Daily  . aspirin EC  81 mg Oral Daily  . chlorhexidine  15 mL Mouth Rinse BID  . Chlorhexidine Gluconate Cloth  6 each Topical Daily  . enoxaparin (LOVENOX) injection  40 mg Subcutaneous Q24H  . furosemide  40 mg Intravenous Q8H  . insulin aspart  0-9 Units Subcutaneous TID WC  . ipratropium-albuterol  3 mL Nebulization TID  . mouth rinse  15 mL Mouth Rinse q12n4p  . metoprolol tartrate  12.5 mg Oral BID  . montelukast  10 mg Oral QHS  . nicotine  21 mg Transdermal Daily  . simvastatin  40 mg Oral QHS   Continuous Infusions:   LOS: 2 days    Time spent: 36 minutes   Kycen Spalla J [place] DO [facility] Pager [phone]  If 7PM-7AM, please contact night-coverage [email or web address] Password [facility] 11/06/2018, 11:43 AM

## 2018-11-06 NOTE — Plan of Care (Signed)
Pt has been educated on the use of her Bipap machine for tonight.

## 2018-11-07 LAB — CBC
HCT: 56.1 % — ABNORMAL HIGH (ref 36.0–46.0)
Hemoglobin: 16.9 g/dL — ABNORMAL HIGH (ref 12.0–15.0)
MCH: 26.9 pg (ref 26.0–34.0)
MCHC: 30.1 g/dL (ref 30.0–36.0)
MCV: 89.2 fL (ref 80.0–100.0)
Platelets: 263 10*3/uL (ref 150–400)
RBC: 6.29 MIL/uL — ABNORMAL HIGH (ref 3.87–5.11)
RDW: 14.8 % (ref 11.5–15.5)
WBC: 9.5 10*3/uL (ref 4.0–10.5)
nRBC: 0 % (ref 0.0–0.2)

## 2018-11-07 LAB — BASIC METABOLIC PANEL
Anion gap: 13 (ref 5–15)
BUN: 30 mg/dL — ABNORMAL HIGH (ref 8–23)
CO2: 40 mmol/L — ABNORMAL HIGH (ref 22–32)
Calcium: 8.8 mg/dL — ABNORMAL LOW (ref 8.9–10.3)
Chloride: 81 mmol/L — ABNORMAL LOW (ref 98–111)
Creatinine, Ser: 1.01 mg/dL — ABNORMAL HIGH (ref 0.44–1.00)
GFR calc Af Amer: >60 mL/min (ref >60)
GFR calc non Af Amer: 56 mL/min — ABNORMAL LOW (ref >60)
Glucose, Bld: 110 mg/dL — ABNORMAL HIGH (ref 70–99)
Potassium: 4 mmol/L (ref 3.5–5.1)
Sodium: 134 mmol/L — ABNORMAL LOW (ref 135–145)

## 2018-11-07 LAB — GLUCOSE, CAPILLARY
Glucose-Capillary: 127 mg/dL — ABNORMAL HIGH (ref 70–99)
Glucose-Capillary: 158 mg/dL — ABNORMAL HIGH (ref 70–99)
Glucose-Capillary: 126 mg/dL — ABNORMAL HIGH (ref 70–99)
Glucose-Capillary: 190 mg/dL — ABNORMAL HIGH (ref 70–99)

## 2018-11-07 LAB — MAGNESIUM: Magnesium: 2.5 mg/dL — ABNORMAL HIGH (ref 1.7–2.4)

## 2018-11-07 NOTE — Progress Notes (Signed)
SATURATION QUALIFICATIONS: (This note is used to comply with regulatory documentation for home oxygen)  Patient Saturations on Room Air at Rest = 91%  Patient Saturations on Room Air while Ambulating = 81%  Patient Saturations on 6 Liters of oxygen while Ambulating = 83%  Please briefly explain why patient needs home oxygen: to maintain appropriate SaO2 levels.   Blondell Reveal Kistler PT 11/07/2018  Acute Rehabilitation Services Pager [phone] Office [phone]

## 2018-11-07 NOTE — Evaluation (Signed)
Physical Therapy Evaluation Patient Details Name: Rebecca Mcintyre MRN: [id number] DOB: 1945-08-14 Today's Date: 11/07/2018   History of Present Illness  73 y.o. female with medical history significant of CAD, remote stent to LAD in 2002, tobacco abuse, dyslipidemia, obesity presented to the ED with shortness of breath and possible syncopal episode. Dx of acute respiratory failure, acute on chronic CHF.  Clinical Impression  Pt admitted with above diagnosis. Pt currently with functional limitations due to the deficits listed below (see PT Problem List). Pt ambulated 30' with RW, distance limited by therpist 2* hypoxia with ambulation. SaO2 81% on room air walking, 84% on 6L O2 walking, 94% on 4L O2 at rest. Instructed pt in seated BUE/LE strengthening.  Pt will benefit from skilled PT to increase their independence and safety with mobility to allow discharge to the venue listed below.       Follow Up Recommendations Home health PT    Equipment Recommendations  Other (comment)(4 wheeled RW)    Recommendations for Other Services       Precautions / Restrictions Precautions Precautions: Other (comment) Precaution Comments: monitor O2 sats Restrictions Weight Bearing Restrictions: No      Mobility  Bed Mobility Overal bed mobility: Modified Independent             General bed mobility comments: HOB up, used rail  Transfers Overall transfer level: Independent Equipment used: None                Ambulation/Gait Ambulation/Gait assistance: Supervision Gait Distance (Feet): 30 Feet Assistive device: Rolling walker (2 wheeled) Gait Pattern/deviations: Step-through pattern;Decreased stride length     General Gait Details: distance limited by PT due to hypoxia with ambulation. SaO2 83% on 6L O2 walking. 2/4 dyspnea  Stairs            Wheelchair Mobility    Modified Rankin (Stroke Patients Only)       Balance Overall balance assessment: Modified  Independent                                           Pertinent Vitals/Pain Pain Assessment: No/denies pain    Home Living Family/patient expects to be discharged to:: Private residence Living Arrangements: Alone     Home Access: Stairs to enter   Technical brewer of Steps: 3 Home Layout: One level Home Equipment: None      Prior Function Level of Independence: Independent         Comments: pt reports she can arrange to have help at home if needed     Hand Dominance        Extremity/Trunk Assessment   Upper Extremity Assessment Upper Extremity Assessment: Overall WFL for tasks assessed    Lower Extremity Assessment Lower Extremity Assessment: Overall WFL for tasks assessed    Cervical / Trunk Assessment Cervical / Trunk Assessment: Normal  Communication   Communication: HOH  Cognition Arousal/Alertness: Awake/alert Behavior During Therapy: WFL for tasks assessed/performed Overall Cognitive Status: Within Functional Limits for tasks assessed                                        General Comments      Exercises General Exercises - Upper Extremity Shoulder Flexion: AROM;Both;5 reps;Seated General Exercises - Lower Extremity Ankle Circles/Pumps: AROM;Both;10  reps;Seated Long Arc Quad: AROM;Both;5 reps;Seated Hip Flexion/Marching: AROM;Both;5 reps;Seated   Assessment/Plan    PT Assessment Patient needs continued PT services  PT Problem List Cardiopulmonary status limiting activity;Decreased activity tolerance       PT Treatment Interventions Therapeutic exercise;Gait training;DME instruction;Therapeutic activities;Functional mobility training;Patient/family education    PT Goals (Current goals can be found in the Care Plan section)  Acute Rehab PT Goals Patient Stated Goal: to walk farther PT Goal Formulation: With patient Time For Goal Achievement: 11/14/18 Potential to Achieve Goals: Good     Frequency Min 3X/week   Barriers to discharge        Co-evaluation               AM-PAC PT "6 Clicks" Mobility  Outcome Measure Help needed turning from your back to your side while in a flat bed without using bedrails?: A Little Help needed moving from lying on your back to sitting on the side of a flat bed without using bedrails?: A Little Help needed moving to and from a bed to a chair (including a wheelchair)?: None Help needed standing up from a chair using your arms (e.g., wheelchair or bedside chair)?: None Help needed to walk in hospital room?: A Little Help needed climbing 3-5 steps with a railing? : A Little 6 Click Score: 20    End of Session Equipment Utilized During Treatment: Oxygen;Gait belt Activity Tolerance: Treatment limited secondary to medical complications (Comment)(hypoxia with ambulation) Patient left: in chair;with call bell/phone within reach Nurse Communication: Mobility status PT Visit Diagnosis: Difficulty in walking, not elsewhere classified (R26.2)    Time: 5537-4827 PT Time Calculation (min) (ACUTE ONLY): 17 min   Charges:   PT Evaluation $PT Eval Low Complexity: 1 Low          Philomena Doheny PT 11/07/2018  Acute Rehabilitation Services Pager [phone] Office [phone]

## 2018-11-07 NOTE — Progress Notes (Signed)
PROGRESS NOTE    Rebecca Mcintyre  ZOX:[id number] DOB: 17-Sep-1945 DOA: 11/04/2018 PCP: Burnis Medin, MD    Brief Narrative:   Rebecca Mcintyre is a 73 y.o. female with medical history significant of CAD, remote stent to LAD in 2002, tobacco abuse, dyslipidemia, obesity presented to the ED with shortness of breath and possible syncopal episode.  Patient reports progressive lower extremity swelling and dyspnea over the past 2 weeks; requiring her to go sleep almost in the upright position.  Also reports associated abdominal tightness.  Early on the morning of admission, roughly 5 AM she remembers walking up to the bathroom, apparently fell and does not recall events and woke up on the floor. She denies any chest pain palpitations, no fevers or chills, no cough congestion etc.  Patient was noted to be hypoxic with sats in the mid 80s which improved with 4 liters of O2, chest x-ray showed COPD changes, CT angiogram was negative for pulmonary embolism.  Patient referred for admission by EDP for further evaluation and treatment of acute hypoxic respiratory failure likely secondary to CHF exacerbation.   Assessment & Plan:   Principal Problem:   Acute respiratory failure with hypoxia and hypercapnia (HCC) Active Problems:   GOUT   OBESITY   Essential hypertension   TOBACCO ABUSE, HX OF   Pre-diabetes   Acute CHF (congestive heart failure) (HCC)   CHF (congestive heart failure) (HCC)   Type 2 diabetes mellitus without complication (HCC)   Acute on chronic diastolic CHF (congestive heart failure) (HCC)   Acute hypoxic respiratory failure with hypercapnia Acute on chronic diastolic congestive heart failure Likely underlying obstructive sleep apnea Presenting from home with a 2-week history of progressive dyspnea, lower extremity edema and abdominal fullness/tightness.  BNP on admission elevated at 586.9.  COVID-19 test negative.  D-dimer elevated 0.81.  Chest x-ray with emphysematous changes  consistent with COPD.  CT angiogram PE study negative for PE but noted cardiomegaly.  Transthoracic echocardiogram with EF 81-19%, diastolic dysfunction, RV reduced systolic function, RV systolic pressure elevated consistent with volume overload, IVC dilated. ABG, pH 7.27, PaCO2 85, PaO2 175, HCO3 38.5.  --Slowly improving, off BiPAP this morning on 4 L nasal cannula --net negative 654m past 24h with unmeasured urine occurrences --Continue BiPAP prn and qHS --Furosemide 40 mg IV q8h --Daily weights and strict I's and O's --Low-salt diet, consistent carbohydrate --Supportive care  Confusion Syncopal episode Patient reported syncopal episode while going to the bathroom prior to presentation.  Patient has been more confused per daughter.  Etiology likely from hypoxia which she is experiencing over the past 2 weeks with etiology likely underlying acute on chronic congestive heart failure.  CT head without contrast negative for acute intracranial pathology, did note left posterior scalp swelling without underlying fracture.  ABG on admission noted with elevated PaCO2 of 85.  Echo with normal EF.  CTPA negative for pulmonary embolism.  TSH within normal limits at 1.266. --Slowly improving --Continue BiPAP prn as above --Supportive care  Type 2 diabetes mellitus Hemoglobin A1c 7.0 on 11/03/2018, well controlled.  Home regimen includes metformin 500 mg p.o. twice daily. --Hold oral hypoglycemics while inpatient --Consistent carbohydrate diet --insulin Sliding scale for coverage  Suspect underlying COPD Hx tobacco abuse disorder History of significant tobacco abuse.  Chest x-ray findings suggestive of emphysematous changes consistent with underlying COPD.  No PFTs available for review in EMR.  Patient on montelukast 10 mg p.o. daily and albuterol inhaler as needed at home. --continue  nebs as needed  --Counseled on tobacco cessation --Would benefit from pulmonary outpatient follow-up with  PFTs --Nicotine patch  Morbid obesity BMI 36.75.  Patient will need aggressive weight loss measures in the future as this complicates all facets of care  Hx CAD Remote LAD stent in 2002. --Continue aspirin, metoprolol and statin  Weakness/debility: --PT consult   DVT prophylaxis: Lovenox Code Status: Full code Family Communication: Updated daughter [name] by telephone this am about plan of care Disposition Plan: Possible transfer to med telemetry later this afternoon if remains stable supplemental oxygen.  Further depending on clinical course.  Pending PT evaluation   Consultants:   none  Procedures:   BiPAP 6/11 - 6/12; then just nocturnally  Antimicrobials:  none   Subjective:  Patient seen and examined at bedside resting comfortably in bedside chair.  On BiPAP overnight, now back on nasal cannula 4 L/min.  Feels her legs are much improved with decrease edema.  Continues with moderate shortness of breath but improving.  Like to know when she can transfer out of the SDU.  No other complaints or concerns at this time.  Denies headache, no fever/chills/night sweats, no nausea/vomiting/diarrhea, no chest pain, no palpitations, no abdominal pain, no cough/congestion, no paresthesias.  No acute events overnight per nursing staff.  Objective: Vitals:   11/07/18 0700 11/07/18 0846 11/07/18 0914 11/07/18 1013  BP: (!) 145/74   (!) 122/41  Pulse: 73   (!) 108  Resp: 19     Temp:   (!) 97.5 F (36.4 C)   TempSrc:   Axillary   SpO2: 95% 97%    Weight:      Height:        Intake/Output Summary (Last 24 hours) at 11/07/2018 1102 Last data filed at 11/07/2018 0700 Gross per 24 hour  Intake -  Output 1250 ml  Net -1250 ml   Filed Weights   11/05/18 0650 11/06/18 0500 11/07/18 0500  Weight: 94.1 kg 92.2 kg 93.2 kg    Examination:  General exam: Appears calm and comfortable, on 4 L nasal cannula Respiratory system: Coarse breath sounds bilaterally, slightly decreased  bilateral bases, normal respiratory effort, on 4 L nasal cannula Cardiovascular system: S1 & S2 heard, RRR. No JVD, murmurs, rubs, gallops or clicks.  1+ pitting edema up to knee bilaterally Gastrointestinal system: Abdomen is protuberant, nondistended, soft and nontender. No organomegaly or masses felt. Normal bowel sounds heard. Central nervous system: Alert, No focal neurological deficits. Extremities: Symmetric 5 x 5 power. Skin: No rashes, lesions or ulcers    Data Reviewed: I have personally reviewed following labs and imaging studies  CBC: Recent Labs  Lab 11/03/18 0856 11/04/18 1105 11/05/18 0928 11/06/18 0307 11/07/18 0209  WBC 8.3 9.6 9.0 9.3 9.5  NEUTROABS 4.3 6.1  --   --   --   HGB 16.9* 17.4* 17.0* 16.4* 16.9*  HCT 53.1* 57.0* 57.7* 55.4* 56.1*  MCV 87.5 90.3 94.0 91.3 89.2  PLT 264.0 274 244 226 536   Basic Metabolic Panel: Recent Labs  Lab 11/04/18 1105 11/05/18 0928 11/06/18 0307 11/07/18 0209  NA 137 140 137 134*  K 4.4 4.9 4.0 4.0  CL 95* 89* 86* 81*  CO2 33* 38* 37* 40*  GLUCOSE 108* 103* 98 110*  BUN 23 21 27* 30*  CREATININE 0.88 1.12* 0.92 1.01*  CALCIUM 8.7* 8.7* 8.4* 8.8*  MG  --   --  2.2 2.5*   GFR: Estimated Creatinine Clearance: 54.6 mL/min (A) (by C-G  formula based on SCr of 1.01 mg/dL (H)). Liver Function Tests: Recent Labs  Lab 11/04/18 1105  AST 40  ALT 52*  ALKPHOS 115  BILITOT 0.6  PROT 7.4  ALBUMIN 3.6   No results for input(s): LIPASE, AMYLASE in the last 168 hours. No results for input(s): AMMONIA in the last 168 hours. Coagulation Profile: No results for input(s): INR, PROTIME in the last 168 hours. Cardiac Enzymes: No results for input(s): CKTOTAL, CKMB, CKMBINDEX, TROPONINI in the last 168 hours. BNP (last 3 results) No results for input(s): PROBNP in the last 8760 hours. HbA1C: No results for input(s): HGBA1C in the last 72 hours. CBG: Recent Labs  Lab 11/06/18 0810 11/06/18 1221 11/06/18 1623  11/06/18 2109 11/07/18 0853  GLUCAP 91 89 117* 116* 127*   Lipid Profile: No results for input(s): CHOL, HDL, LDLCALC, TRIG, CHOLHDL, LDLDIRECT in the last 72 hours. Thyroid Function Tests: Recent Labs    11/05/18 0928  TSH 1.266   Anemia Panel: No results for input(s): VITAMINB12, FOLATE, FERRITIN, TIBC, IRON, RETICCTPCT in the last 72 hours. Sepsis Labs: No results for input(s): PROCALCITON, LATICACIDVEN in the last 168 hours.  Recent Results (from the past 240 hour(s))  SARS Coronavirus 2 (CEPHEID - Performed in [place] hospital lab), Hosp Order     Status: None   Collection Time: 11/04/18 11:02 AM   Specimen: Nasopharyngeal Swab  Result Value Ref Range Status   SARS Coronavirus 2 NEGATIVE NEGATIVE Final    Comment: (NOTE) If result is NEGATIVE SARS-CoV-2 target nucleic acids are NOT DETECTED. The SARS-CoV-2 RNA is generally detectable in upper and lower  respiratory specimens during the acute phase of infection. The lowest  concentration of SARS-CoV-2 viral copies this assay can detect is 250  copies / mL. A negative result does not preclude SARS-CoV-2 infection  and should not be used as the sole basis for treatment or other  patient management decisions.  A negative result may occur with  improper specimen collection / handling, submission of specimen other  than nasopharyngeal swab, presence of viral mutation(s) within the  areas targeted by this assay, and inadequate number of viral copies  (<250 copies / mL). A negative result must be combined with clinical  observations, patient history, and epidemiological information. If result is POSITIVE SARS-CoV-2 target nucleic acids are DETECTED. The SARS-CoV-2 RNA is generally detectable in upper and lower  respiratory specimens dur ing the acute phase of infection.  Positive  results are indicative of active infection with SARS-CoV-2.  Clinical  correlation with patient history and other diagnostic information is   necessary to determine patient infection status.  Positive results do  not rule out bacterial infection or co-infection with other viruses. If result is PRESUMPTIVE POSTIVE SARS-CoV-2 nucleic acids MAY BE PRESENT.   A presumptive positive result was obtained on the submitted specimen  and confirmed on repeat testing.  While 2019 novel coronavirus  (SARS-CoV-2) nucleic acids may be present in the submitted sample  additional confirmatory testing may be necessary for epidemiological  and / or clinical management purposes  to differentiate between  SARS-CoV-2 and other Sarbecovirus currently known to infect humans.  If clinically indicated additional testing with an alternate test  methodology [phone]) is advised. The SARS-CoV-2 RNA is generally  detectable in upper and lower respiratory sp ecimens during the acute  phase of infection. The expected result is Negative. Fact Sheet for Patients:  StrictlyIdeas.no Fact Sheet for Healthcare Providers: BankingDealers.co.za This test is not yet approved  or cleared by the [place] and has been authorized for detection and/or diagnosis of SARS-CoV-2 by FDA under an Emergency Use Authorization (EUA).  This EUA will remain in effect (meaning this test can be used) for the duration of the COVID-19 declaration under Section 564(b)(1) of the Act, 21 U.S.C. section 360bbb-3(b)(1), unless the authorization is terminated or revoked sooner. Performed at [facility], [place] [address] [place], [place] [address]   Culture, Urine     Status: None   Collection Time: 11/05/18  5:56 AM   Specimen: Urine, Catheterized  Result Value Ref Range Status   Specimen Description   Final    URINE, CATHETERIZED Performed at [facility] [address] [place], [place] [address]    Special Requests   Final    NONE Performed at [facility], [place]  [address] [place], [place] [address]    Culture   Final    NO GROWTH Performed at [facility], [place] [address] [place], [place] [address]    Report Status 11/06/2018 FINAL  Final  MRSA PCR Screening     Status: None   Collection Time: 11/05/18  6:52 AM   Specimen: Nasal Mucosa; Nasopharyngeal  Result Value Ref Range Status   MRSA by PCR NEGATIVE NEGATIVE Final    Comment:        The GeneXpert MRSA Assay (FDA approved for NASAL specimens only), is one component of a comprehensive MRSA colonization surveillance program. It is not intended to diagnose MRSA infection nor to guide or monitor treatment for MRSA infections. Performed at [facility], [place] [address] [place],  [address]          Radiology Studies: Dg Chest Port 1 View  Result Date: 11/06/2018 CLINICAL DATA:  Shortness of breath EXAM: PORTABLE CHEST 1 VIEW COMPARISON:  11/05/2018 FINDINGS: Cardiac shadow is stable. Aortic calcifications are again noted. Mild interstitial changes are noted but slightly improved when compare with the prior exam consistent with improving CHF. No focal infiltrate is seen. No effusion is noted. IMPRESSION: Improving CHF. Electronically Signed   By: Inez Catalina M.D.   On: 11/06/2018 07:48        Scheduled Meds: . allopurinol  100 mg Oral Daily  . aspirin EC  81 mg Oral Daily  . chlorhexidine  15 mL Mouth Rinse BID  . Chlorhexidine Gluconate Cloth  6 each Topical Daily  . enoxaparin (LOVENOX) injection  40 mg Subcutaneous Q24H  . furosemide  40 mg Intravenous Q8H  . insulin aspart  0-9 Units Subcutaneous TID WC  . ipratropium-albuterol  3 mL Nebulization TID  . mouth rinse  15 mL Mouth Rinse q12n4p  . metoprolol tartrate  12.5 mg Oral BID  . montelukast  10 mg Oral QHS  . nicotine  21 mg Transdermal Daily  . simvastatin  40 mg Oral QHS   Continuous Infusions:   LOS: 3 days    Time spent: 28 minutes   Jayon Matton J [place] DO [facility] Pager [phone]  If 7PM-7AM, please contact night-coverage [email or web address] Password [email or web address] 11/07/2018, 11:02 AM

## 2018-11-08 ENCOUNTER — Inpatient Hospital Stay (HOSPITAL_COMMUNITY): Payer: Medicare Other

## 2018-11-08 LAB — BASIC METABOLIC PANEL
Anion gap: 12 (ref 5–15)
BUN: 30 mg/dL — ABNORMAL HIGH (ref 8–23)
CO2: 37 mmol/L — ABNORMAL HIGH (ref 22–32)
Calcium: 9.1 mg/dL (ref 8.9–10.3)
Chloride: 86 mmol/L — ABNORMAL LOW (ref 98–111)
Creatinine, Ser: 0.94 mg/dL (ref 0.44–1.00)
GFR calc Af Amer: >60 mL/min (ref >60)
GFR calc non Af Amer: >60 mL/min (ref >60)
Glucose, Bld: 115 mg/dL — ABNORMAL HIGH (ref 70–99)
Potassium: 3.6 mmol/L (ref 3.5–5.1)
Sodium: 135 mmol/L (ref 135–145)

## 2018-11-08 LAB — CBC
HCT: 55.2 % — ABNORMAL HIGH (ref 36.0–46.0)
Hemoglobin: 16.7 g/dL — ABNORMAL HIGH (ref 12.0–15.0)
MCH: 26.7 pg (ref 26.0–34.0)
MCHC: 30.3 g/dL (ref 30.0–36.0)
MCV: 88.2 fL (ref 80.0–100.0)
Platelets: 237 10*3/uL (ref 150–400)
RBC: 6.26 MIL/uL — ABNORMAL HIGH (ref 3.87–5.11)
RDW: 14.6 % (ref 11.5–15.5)
WBC: 7.7 10*3/uL (ref 4.0–10.5)
nRBC: 0 % (ref 0.0–0.2)

## 2018-11-08 LAB — GLUCOSE, CAPILLARY
Glucose-Capillary: 101 mg/dL — ABNORMAL HIGH (ref 70–99)
Glucose-Capillary: 130 mg/dL — ABNORMAL HIGH (ref 70–99)
Glucose-Capillary: 132 mg/dL — ABNORMAL HIGH (ref 70–99)
Glucose-Capillary: 115 mg/dL — ABNORMAL HIGH (ref 70–99)

## 2018-11-08 LAB — MAGNESIUM: Magnesium: 2.4 mg/dL (ref 1.7–2.4)

## 2018-11-08 MED ORDER — TRAZODONE HCL 50 MG PO TABS: 50.0000 mg | ORAL_TABLET | Freq: Every evening | ORAL | Status: DC | PRN

## 2018-11-08 MED ORDER — TRAZODONE HCL 50 MG PO TABS
50.0000 mg | ORAL_TABLET | Freq: Once | ORAL | Status: AC
  Administered 2018-11-08: 50 mg via ORAL
  Filled 2018-11-08: qty 1

## 2018-11-08 MED ORDER — POTASSIUM CHLORIDE CRYS ER 20 MEQ PO TBCR
40.0000 meq | EXTENDED_RELEASE_TABLET | Freq: Once | ORAL | Status: AC
  Administered 2018-11-08: 40 meq via ORAL
  Filled 2018-11-08: qty 2

## 2018-11-08 NOTE — Progress Notes (Signed)
Patient ambulated with walker full length of hallway.  Patient oxygen saturation dropped to 86% on room air, and she recovered to 93% on 4L.  Tolerated activity well.

## 2018-11-08 NOTE — Progress Notes (Signed)
Patient has voided twice without measuring hat in toilet.  Educated patient as to necessity of measuring urine while treating heart failure and taking medications such as lasix.  Patient verbalized understanding.  Hat placed in toilet. Will continue to monitor.

## 2018-11-08 NOTE — Progress Notes (Signed)
PROGRESS NOTE    Rebecca Mcintyre  QQP:[id number] DOB: Sep 22, 1945 DOA: 11/04/2018 PCP: Burnis Medin, MD    Brief Narrative:   Rebecca Mcintyre is a 73 y.o. female with medical history significant of CAD, remote stent to LAD in 2002, tobacco abuse, dyslipidemia, obesity presented to the ED with shortness of breath and possible syncopal episode.  Patient reports progressive lower extremity swelling and dyspnea over the past 2 weeks; requiring her to go sleep almost in the upright position.  Also reports associated abdominal tightness.  Early on the morning of admission, roughly 5 AM she remembers walking up to the bathroom, apparently fell and does not recall events and woke up on the floor. She denies any chest pain palpitations, no fevers or chills, no cough congestion etc.  Patient was noted to be hypoxic with sats in the mid 80s which improved with 4 liters of O2, chest x-ray showed COPD changes, CT angiogram was negative for pulmonary embolism.  Patient referred for admission by EDP for further evaluation and treatment of acute hypoxic respiratory failure likely secondary to CHF exacerbation.   Assessment & Plan:   Principal Problem:   Acute respiratory failure with hypoxia and hypercapnia (HCC) Active Problems:   GOUT   OBESITY   Essential hypertension   TOBACCO ABUSE, HX OF   Pre-diabetes   Acute CHF (congestive heart failure) (HCC)   CHF (congestive heart failure) (HCC)   Type 2 diabetes mellitus without complication (HCC)   Acute on chronic diastolic CHF (congestive heart failure) (HCC)   Acute hypoxic respiratory failure with hypercapnia Acute on chronic diastolic congestive heart failure Likely underlying obstructive sleep apnea Presenting from home with a 2-week history of progressive dyspnea, lower extremity edema and abdominal fullness/tightness.  BNP on admission elevated at 586.9.  COVID-19 test negative.  D-dimer elevated 0.81.  Chest x-ray with emphysematous changes  consistent with COPD.  CT angiogram PE study negative for PE but noted cardiomegaly.  Transthoracic echocardiogram with EF 71-24%, diastolic dysfunction, RV reduced systolic function, RV systolic pressure elevated consistent with volume overload, IVC dilated. ABG, pH 7.27, PaCO2 85, PaO2 175, HCO3 38.5.  --Slowly improving, continues to require 4LNC at rest with significant de-saturation on ambulation --net negative 2.0LmL past 24h, negative 6.4L since admission --Continue BiPAP prn qHS --Furosemide 40 mg IV q8h --Daily weights and strict I's and O's --Low-salt diet, consistent carbohydrate --Supportive care  Confusion Syncopal episode Patient reported syncopal episode while going to the bathroom prior to presentation.  Patient has been more confused per daughter.  Etiology likely from hypoxia which she is experiencing over the past 2 weeks with etiology likely underlying acute on chronic congestive heart failure.  CT head without contrast negative for acute intracranial pathology, did note left posterior scalp swelling without underlying fracture.  ABG on admission noted with elevated PaCO2 of 85.  Echo with normal EF.  CTPA negative for pulmonary embolism.  TSH within normal limits at 1.266. --mentation now back at baseline. Likely 2/2 hypoxia and elevated CO2 --Continue BiPAP prn as above, diuresis, supplemental oxygen --Supportive care  Type 2 diabetes mellitus Hemoglobin A1c 7.0 on 11/03/2018, well controlled.  Home regimen includes metformin 500 mg p.o. twice daily. --Hold oral hypoglycemics while inpatient --Consistent carbohydrate diet --insulin Sliding scale for coverage  Suspect underlying COPD Hx tobacco abuse disorder History of significant tobacco abuse.  Chest x-ray findings suggestive of emphysematous changes consistent with underlying COPD.  No PFTs available for review in EMR.  Patient on  montelukast 10 mg p.o. daily and albuterol inhaler as needed at home. --continue nebs as  needed  --Counseled on tobacco cessation --likely benefit from LAMA/LABA on discharge --Would benefit from pulmonary outpatient follow-up with PFTs --Nicotine patch  Morbid obesity BMI 36.75.  Patient will need aggressive weight loss measures in the future as this complicates all facets of care  Hx CAD Remote LAD stent in 2002. --Continue aspirin, metoprolol and statin  Weakness/debility: --PT following, currently recommending HHPT and 4 wheeled walker --continue mobilization efforts with attempts at weaning oxygen daily   DVT prophylaxis: Lovenox Code Status: Full code Family Communication: Updated daughter [name] by telephone this am about plan of care Disposition Plan: continue inpatient, further dependent on clinical course. Likely home with home health when stable   Consultants:   none  Procedures:   BiPAP 6/11 - 6/12; now prn  Antimicrobials:  none   Subjective:  Patient seen and examined at bedside.  Resting comfortably.  Continues nasal cannula 4 L/min.  Lower extremity edema much improved.  Reports good sleep overnight.  Significant hypoxia on ambulation yesterday with physical therapy. No other complaints or concerns at this time.  Denies headache, no fever/chills/night sweats, no nausea/vomiting/diarrhea, no chest pain, no palpitations, no abdominal pain, no cough/congestion, no paresthesias.  No acute events overnight per nursing staff.  Objective: Vitals:   11/07/18 2137 11/08/18 0500 11/08/18 0530 11/08/18 0749  BP: (!) 111/57  119/67   Pulse: 91  80   Resp:   20   Temp:   98 F (36.7 C)   TempSrc:   Oral   SpO2:   96% 91%  Weight:  91.3 kg    Height:        Intake/Output Summary (Last 24 hours) at 11/08/2018 1137 Last data filed at 11/08/2018 1123 Gross per 24 hour  Intake 960 ml  Output 2300 ml  Net -1340 ml   Filed Weights   11/06/18 0500 11/07/18 0500 11/08/18 0500  Weight: 92.2 kg 93.2 kg 91.3 kg    Examination:  General exam:  Appears calm and comfortable, on 4 L nasal cannula Respiratory system: Coarse breath sounds bilaterally, slightly decreased bilateral bases, normal respiratory effort, on 4 L nasal cannula Cardiovascular system: S1 & S2 heard, RRR. No JVD, murmurs, rubs, gallops or clicks.   trace pitting edema up to knee bilaterally Gastrointestinal system: Abdomen is protuberant, nondistended, soft and nontender. No organomegaly or masses felt. Normal bowel sounds heard. Central nervous system: Alert, No focal neurological deficits. Extremities: Symmetric 5 x 5 power. Skin: No rashes, lesions or ulcers    Data Reviewed: I have personally reviewed following labs and imaging studies  CBC: Recent Labs  Lab 11/03/18 0856 11/04/18 1105 11/05/18 0928 11/06/18 0307 11/07/18 0209 11/08/18 0532  WBC 8.3 9.6 9.0 9.3 9.5 7.7  NEUTROABS 4.3 6.1  --   --   --   --   HGB 16.9* 17.4* 17.0* 16.4* 16.9* 16.7*  HCT 53.1* 57.0* 57.7* 55.4* 56.1* 55.2*  MCV 87.5 90.3 94.0 91.3 89.2 88.2  PLT 264.0 274 244 226 263 161   Basic Metabolic Panel: Recent Labs  Lab 11/04/18 1105 11/05/18 0928 11/06/18 0307 11/07/18 0209 11/08/18 0532  NA 137 140 137 134* 135  K 4.4 4.9 4.0 4.0 3.6  CL 95* 89* 86* 81* 86*  CO2 33* 38* 37* 40* 37*  GLUCOSE 108* 103* 98 110* 115*  BUN 23 21 27* 30* 30*  CREATININE 0.88 1.12* 0.92 1.01* 0.94  CALCIUM  8.7* 8.7* 8.4* 8.8* 9.1  MG  --   --  2.2 2.5* 2.4   GFR: Estimated Creatinine Clearance: 58.1 mL/min (by C-G formula based on SCr of 0.94 mg/dL). Liver Function Tests: Recent Labs  Lab 11/04/18 1105  AST 40  ALT 52*  ALKPHOS 115  BILITOT 0.6  PROT 7.4  ALBUMIN 3.6   No results for input(s): LIPASE, AMYLASE in the last 168 hours. No results for input(s): AMMONIA in the last 168 hours. Coagulation Profile: No results for input(s): INR, PROTIME in the last 168 hours. Cardiac Enzymes: No results for input(s): CKTOTAL, CKMB, CKMBINDEX, TROPONINI in the last 168 hours. BNP  (last 3 results) No results for input(s): PROBNP in the last 8760 hours. HbA1C: No results for input(s): HGBA1C in the last 72 hours. CBG: Recent Labs  Lab 11/07/18 0853 11/07/18 1201 11/07/18 1651 11/07/18 2256 11/08/18 0804  GLUCAP 127* 158* 126* 190* 101*   Lipid Profile: No results for input(s): CHOL, HDL, LDLCALC, TRIG, CHOLHDL, LDLDIRECT in the last 72 hours. Thyroid Function Tests: No results for input(s): TSH, T4TOTAL, FREET4, T3FREE, THYROIDAB in the last 72 hours. Anemia Panel: No results for input(s): VITAMINB12, FOLATE, FERRITIN, TIBC, IRON, RETICCTPCT in the last 72 hours. Sepsis Labs: No results for input(s): PROCALCITON, LATICACIDVEN in the last 168 hours.  Recent Results (from the past 240 hour(s))  SARS Coronavirus 2 (CEPHEID - Performed in [place] hospital lab), Hosp Order     Status: None   Collection Time: 11/04/18 11:02 AM   Specimen: Nasopharyngeal Swab  Result Value Ref Range Status   SARS Coronavirus 2 NEGATIVE NEGATIVE Final    Comment: (NOTE) If result is NEGATIVE SARS-CoV-2 target nucleic acids are NOT DETECTED. The SARS-CoV-2 RNA is generally detectable in upper and lower  respiratory specimens during the acute phase of infection. The lowest  concentration of SARS-CoV-2 viral copies this assay can detect is 250  copies / mL. A negative result does not preclude SARS-CoV-2 infection  and should not be used as the sole basis for treatment or other  patient management decisions.  A negative result may occur with  improper specimen collection / handling, submission of specimen other  than nasopharyngeal swab, presence of viral mutation(s) within the  areas targeted by this assay, and inadequate number of viral copies  (<250 copies / mL). A negative result must be combined with clinical  observations, patient history, and epidemiological information. If result is POSITIVE SARS-CoV-2 target nucleic acids are DETECTED. The SARS-CoV-2 RNA is  generally detectable in upper and lower  respiratory specimens dur ing the acute phase of infection.  Positive  results are indicative of active infection with SARS-CoV-2.  Clinical  correlation with patient history and other diagnostic information is  necessary to determine patient infection status.  Positive results do  not rule out bacterial infection or co-infection with other viruses. If result is PRESUMPTIVE POSTIVE SARS-CoV-2 nucleic acids MAY BE PRESENT.   A presumptive positive result was obtained on the submitted specimen  and confirmed on repeat testing.  While 2019 novel coronavirus  (SARS-CoV-2) nucleic acids may be present in the submitted sample  additional confirmatory testing may be necessary for epidemiological  and / or clinical management purposes  to differentiate between  SARS-CoV-2 and other Sarbecovirus currently known to infect humans.  If clinically indicated additional testing with an alternate test  methodology [phone]) is advised. The SARS-CoV-2 RNA is generally  detectable in upper and lower respiratory sp ecimens during the acute  phase of infection. The expected result is Negative. Fact Sheet for Patients:  StrictlyIdeas.no Fact Sheet for Healthcare Providers: BankingDealers.co.za This test is not yet approved or cleared by the [place] FDA and has been authorized for detection and/or diagnosis of SARS-CoV-2 by FDA under an Emergency Use Authorization (EUA).  This EUA will remain in effect (meaning this test can be used) for the duration of the COVID-19 declaration under Section 564(b)(1) of the Act, 21 U.S.C. section 360bbb-3(b)(1), unless the authorization is terminated or revoked sooner. Performed at [facility], [place] [address] [place], [place] [address]   Culture, Urine     Status: None   Collection Time: 11/05/18  5:56 AM   Specimen: Urine, Catheterized  Result Value Ref  Range Status   Specimen Description   Final    URINE, CATHETERIZED Performed at [facility] [address], [place], [place] [address]    Special Requests   Final    NONE Performed at [facility], [place] [address] [place], [place] [address]    Culture   Final    NO GROWTH Performed at [facility], [place] [address], [place], [place] [address]    Report Status 11/06/2018 FINAL  Final  MRSA PCR Screening     Status: None   Collection Time: 11/05/18  6:52 AM   Specimen: Nasal Mucosa; Nasopharyngeal  Result Value Ref Range Status   MRSA by PCR NEGATIVE NEGATIVE Final    Comment:        The GeneXpert MRSA Assay (FDA approved for NASAL specimens only), is one component of a comprehensive MRSA colonization surveillance program. It is not intended to diagnose MRSA infection nor to guide or monitor treatment for MRSA infections. Performed at [facility], [place] [address], [place],  [address]          Radiology Studies: Dg Chest Port 1 View  Result Date: 11/08/2018 CLINICAL DATA:  Shortness of breath. Congestive heart failure. EXAM: PORTABLE CHEST 1 VIEW COMPARISON:  11/06/2018 FINDINGS: Stable mild cardiomegaly. Aortic atherosclerosis. Pulmonary hyperinflation again seen, consistent with COPD. No evidence of pulmonary infiltrate or edema. No evidence of pleural effusion. IMPRESSION: Stable mild cardiomegaly and COPD. No active lung disease. Electronically Signed   By: Earle Gell M.D.   On: 11/08/2018 07:37        Scheduled Meds: . allopurinol  100 mg Oral Daily  . aspirin EC  81 mg Oral Daily  . chlorhexidine  15 mL Mouth Rinse BID  . Chlorhexidine Gluconate Cloth  6 each Topical Daily  . enoxaparin (LOVENOX) injection  40 mg Subcutaneous Q24H  . furosemide  40 mg Intravenous Q8H  . insulin aspart  0-9 Units Subcutaneous TID WC  . ipratropium-albuterol  3 mL Nebulization TID  . mouth rinse  15 mL  Mouth Rinse q12n4p  . metoprolol tartrate  12.5 mg Oral BID  . montelukast  10 mg Oral QHS  . nicotine  21 mg Transdermal Daily  . simvastatin  40 mg Oral QHS   Continuous Infusions:   LOS: 4 days    Time spent: 28 minutes   Hawkins Seaman J [place] DO [facility] Pager [phone]  If 7PM-7AM, please contact night-coverage [email or web address] Password [facility] 11/08/2018, 11:37 AM

## 2018-11-09 LAB — GLUCOSE, CAPILLARY
Glucose-Capillary: 87 mg/dL (ref 70–99)
Glucose-Capillary: 110 mg/dL — ABNORMAL HIGH (ref 70–99)
Glucose-Capillary: 97 mg/dL (ref 70–99)
Glucose-Capillary: 124 mg/dL — ABNORMAL HIGH (ref 70–99)

## 2018-11-09 LAB — BASIC METABOLIC PANEL
Anion gap: 13 (ref 5–15)
BUN: 31 mg/dL — ABNORMAL HIGH (ref 8–23)
CO2: 38 mmol/L — ABNORMAL HIGH (ref 22–32)
Calcium: 9 mg/dL (ref 8.9–10.3)
Chloride: 85 mmol/L — ABNORMAL LOW (ref 98–111)
Creatinine, Ser: 0.79 mg/dL (ref 0.44–1.00)
GFR calc Af Amer: >60 mL/min (ref >60)
GFR calc non Af Amer: >60 mL/min (ref >60)
Glucose, Bld: 96 mg/dL (ref 70–99)
Potassium: 3.6 mmol/L (ref 3.5–5.1)
Sodium: 136 mmol/L (ref 135–145)

## 2018-11-09 LAB — MAGNESIUM: Magnesium: 2.6 mg/dL — ABNORMAL HIGH (ref 1.7–2.4)

## 2018-11-09 MED ORDER — POTASSIUM CHLORIDE CRYS ER 20 MEQ PO TBCR
40.0000 meq | EXTENDED_RELEASE_TABLET | Freq: Once | ORAL | Status: AC
  Administered 2018-11-09: 40 meq via ORAL
  Filled 2018-11-09: qty 2

## 2018-11-09 NOTE — TOC Initial Note (Signed)
Transition of Care [facility]) - Initial/Assessment Note    Patient Details  Name: Rebecca Mcintyre MRN: [id number] Date of Birth: [birth year]  [facility] CM/SW Contact:    Wende Neighbors, LCSW Phone Number: 11/09/2018, 3:01 PM  Clinical Narrative:    CSW met patient at bedside to discuss discharge plan. Patient stated she lives alone but does have support from family and friends in the area. Patient agreeable to discharge with Home Health. CSW gave patient a list of home health agency in the area and she stated she will look into it and let CSW know of her decision               Expected Discharge Plan: [place] Barriers to Discharge: Continued Medical Work up   Patient Goals and CMS Choice Patient states their goals for this hospitalization and ongoing recovery are:: to go home CMS Medicare.gov Compare Post Acute Care list provided to:: Patient Choice offered to / list presented to : Patient  Expected Discharge Plan and Services Expected Discharge Plan: [place] In-house Referral: Clinical Social Work   Post Acute Care Choice: [place] arrangements for the past 2 months: Single Family Home Expected Discharge Date: (unknown)               DME Arranged: Oxygen, Walker rolling DME Agency: [employer] Date DME Agency Contacted: 11/09/18 Time DME Agency Contacted: [phone] Representative spoke with at DME Agency: zack HH Arranged: PT [place] Agency: (pt wants more time to look for agency)        Prior Living Arrangements/Services Living arrangements for the past 2 months: Single Family Home Lives with:: Self Patient language and need for interpreter reviewed:: Yes Do you feel safe going back to the place where you live?: Yes      Need for Family Participation in Patient Care: Yes (Comment) Care giver support system in place?: No (comment)   Criminal Activity/Legal Involvement Pertinent to Current Situation/Hospitalization: No -  Comment as needed  Activities of Daily Living Home Assistive Devices/Equipment: None ADL Screening (condition at time of admission) Patient's cognitive ability adequate to safely complete daily activities?: Yes Is the patient deaf or have difficulty hearing?: No Does the patient have difficulty seeing, even when wearing glasses/contacts?: No Does the patient have difficulty concentrating, remembering, or making decisions?: No Patient able to express need for assistance with ADLs?: Yes Does the patient have difficulty dressing or bathing?: No Independently performs ADLs?: Yes (appropriate for developmental age) Does the patient have difficulty walking or climbing stairs?: No Weakness of Legs: None Weakness of Arms/Hands: None  Permission Sought/Granted   Permission granted to share information with : Yes, Verbal Permission Granted     Permission granted to share info w AGENCY: home health agency        Emotional Assessment Appearance:: Appears stated age Attitude/Demeanor/Rapport: Engaged Affect (typically observed): Accepting Orientation: : Oriented to Self, Oriented to Place, Oriented to  Time, Oriented to Situation Alcohol / Substance Use: Not Applicable Psych Involvement: No (comment)  Admission diagnosis:  Syncope, unspecified syncope type [R55] Patient Active Problem List   Diagnosis Date Noted  . Acute respiratory failure with hypoxia and hypercapnia ([place]) 11/05/2018  . Acute on chronic diastolic CHF (congestive heart failure) ([place]) 11/05/2018  . Acute CHF (congestive heart failure) ([place]) 11/04/2018  . CHF (congestive heart failure) ([place]) 11/04/2018  . Type 2 diabetes mellitus without complication ([place]) 62/95/2841  . Great toe pain, left  01/01/2018  . Myocardial infarction ([place])   . Elevated hemoglobin ([place]) 04/24/2014  . Visit for preventive health examination 04/24/2014  . Pre-diabetes 08/12/2012  . Decreased hearing 04/12/2012  . Welcome to Medicare preventive  visit 04/08/2012  . HYPERURICEMIA 03/15/2009  . GOUT 12/13/2008  . OBESITY 08/23/2008  . OSTEOPENIA 09/01/2007  . MYOCARDIAL INFARCTION, HX OF 07/14/2007  . SOLITARY KIDNEY 07/14/2007  . Hyperlipidemia 04/15/2007  . Essential hypertension 04/15/2007  . CORONARY ARTERY DISEASE 04/15/2007  . OTHER ABNORMAL GLUCOSE 04/15/2007  . TOBACCO ABUSE, HX OF 04/15/2007   PCP:  Burnis Medin, MD Pharmacy:   [id number]: [phone]: [address] [facility] [id number]: [phone]: [phone]    Social Determinants of Health ([place] Interventions    Readmission Risk Interventions No flowsheet data found.

## 2018-11-09 NOTE — Progress Notes (Signed)
PROGRESS NOTE    Rebecca Mcintyre  YIF:[id number] DOB: 03-06-1946 DOA: 11/04/2018 PCP: Burnis Medin, MD    Brief Narrative:   Rebecca Mcintyre is a 73 y.o. female with medical history significant of CAD, remote stent to LAD in 2002, tobacco abuse, dyslipidemia, obesity presented to the ED with shortness of breath and possible syncopal episode.  Patient reports progressive lower extremity swelling and dyspnea over the past 2 weeks; requiring her to go sleep almost in the upright position.  Also reports associated abdominal tightness.  Early on the morning of admission, roughly 5 AM she remembers walking up to the bathroom, apparently fell and does not recall events and woke up on the floor. She denies any chest pain palpitations, no fevers or chills, no cough congestion etc.  Patient was noted to be hypoxic with sats in the mid 80s which improved with 4 liters of O2, chest x-ray showed COPD changes, CT angiogram was negative for pulmonary embolism.  Patient referred for admission by EDP for further evaluation and treatment of acute hypoxic respiratory failure likely secondary to CHF exacerbation.   Assessment & Plan:   Principal Problem:   Acute respiratory failure with hypoxia and hypercapnia (HCC) Active Problems:   GOUT   OBESITY   Essential hypertension   TOBACCO ABUSE, HX OF   Pre-diabetes   Acute CHF (congestive heart failure) (HCC)   CHF (congestive heart failure) (HCC)   Type 2 diabetes mellitus without complication (HCC)   Acute on chronic diastolic CHF (congestive heart failure) (HCC)   Acute hypoxic respiratory failure with hypercapnia Acute on chronic diastolic congestive heart failure Likely underlying obstructive sleep apnea Presenting from home with a 2-week history of progressive dyspnea, lower extremity edema and abdominal fullness/tightness.  BNP on admission elevated at 586.9.  COVID-19 test negative.  D-dimer elevated 0.81.  Chest x-ray with emphysematous changes  consistent with COPD.  CT angiogram PE study negative for PE but noted cardiomegaly.  Transthoracic echocardiogram with EF 86-76%, diastolic dysfunction, RV reduced systolic function, RV systolic pressure elevated consistent with volume overload, IVC dilated. ABG, pH 7.27, PaCO2 85, PaO2 175, HCO3 38.5.  --Slowly improving, continues to require 4LNC at rest with significant de-saturation on ambulation --net negative 652m past 24h with 2 unmeasured urine occurrences, negative 7.0L since admission --Furosemide 40 mg IV q8h --Continue to titrate supplemental oxygen to maintain SPO2 greater than 90% --Daily weights and strict I's and O's --Low-salt diet, consistent carbohydrate --Likely would benefit from outpatient sleep study and referral to outpatient pulmonology --Supportive care  Confusion Syncopal episode Patient reported syncopal episode while going to the bathroom prior to presentation.  Patient has been more confused per daughter.  Etiology likely from hypoxia which she is experiencing over the past 2 weeks with etiology likely underlying acute on chronic congestive heart failure.  CT head without contrast negative for acute intracranial pathology, did note left posterior scalp swelling without underlying fracture.  ABG on admission noted with elevated PaCO2 of 85.  Echo with normal EF.  CTPA negative for pulmonary embolism.  TSH within normal limits at 1.266. --mentation now back at baseline. Likely 2/2 hypoxia and elevated CO2 --Continue  diuresis, supplemental oxygen --Supportive care  Type 2 diabetes mellitus Hemoglobin A1c 7.0 on 11/03/2018, well controlled.  Home regimen includes metformin 500 mg p.o. twice daily. --Hold oral hypoglycemics while inpatient --Consistent carbohydrate diet --insulin Sliding scale for coverage  Suspect underlying COPD Hx tobacco abuse disorder History of significant tobacco abuse.  Chest  x-ray findings suggestive of emphysematous changes consistent with  underlying COPD.  No PFTs available for review in EMR.  Patient on montelukast 10 mg p.o. daily and albuterol inhaler as needed at home. --continue nebs as needed  --Counseled on tobacco cessation --likely benefit from LAMA/LABA on discharge --Would benefit from pulmonary outpatient follow-up with PFTs --Nicotine patch  Morbid obesity BMI 36.75.  Patient will need aggressive weight loss measures in the future as this complicates all facets of care  Hx CAD Remote LAD stent in 2002. --Continue aspirin, metoprolol and statin  Weakness/debility: --PT following, currently recommending HHPT and 4 wheeled walker --continue mobilization efforts with attempts at weaning oxygen daily   DVT prophylaxis: Lovenox Code Status: Full code Family Communication: Updated daughter [name] by telephone this afternoon about plan of care Disposition Plan: continue inpatient, further dependent on clinical course. Likely home with home health when stable   Consultants:   none  Procedures:   BiPAP 6/11 - 6/12; now prn  Antimicrobials:  none   Subjective:  Patient seen and examined at bedside.  Resting comfortably.  Continues nasal cannula 4 L/min.  Lower extremity edema much improved.  Reports good sleep overnight.  Ambulated yesterday with use of walker length of hallway with desaturation to 86% on room air with quick recovery back on oxygen.  No other complaints or concerns at this time.  Denies headache, no fever/chills/night sweats, no nausea/vomiting/diarrhea, no chest pain, no palpitations, no abdominal pain, no cough/congestion, no paresthesias.  No acute events overnight per nursing staff.  Objective: Vitals:   11/08/18 2142 11/09/18 0458 11/09/18 0501 11/09/18 0739  BP: 131/65  134/67   Pulse: (!) 107  87   Resp: (!) 24  (!) 22   Temp: 97.8 F (36.6 C)  98.1 F (36.7 C)   TempSrc: Oral  Oral   SpO2: 92%  (!) 86% 94%  Weight:  91.5 kg    Height:        Intake/Output Summary (Last  24 hours) at 11/09/2018 1150 Last data filed at 11/09/2018 0744 Gross per 24 hour  Intake 720 ml  Output 1550 ml  Net -830 ml   Filed Weights   11/07/18 0500 11/08/18 0500 11/09/18 0458  Weight: 93.2 kg 91.3 kg 91.5 kg    Examination:  General exam: Appears calm and comfortable, on 4 L nasal cannula Respiratory system: Mild wheezing, slightly decreased bilateral bases, normal respiratory effort, on 4 L nasal cannula Cardiovascular system: S1 & S2 heard, RRR. No JVD, murmurs, rubs, gallops or clicks.   trace pitting edema up to bilateral ankles Gastrointestinal system: Abdomen is protuberant, nondistended, soft and nontender. No organomegaly or masses felt. Normal bowel sounds heard. Central nervous system: Alert, No focal neurological deficits. Extremities: Symmetric 5 x 5 power. Skin: No rashes, lesions or ulcers    Data Reviewed: I have personally reviewed following labs and imaging studies  CBC: Recent Labs  Lab 11/03/18 0856 11/04/18 1105 11/05/18 0928 11/06/18 0307 11/07/18 0209 11/08/18 0532  WBC 8.3 9.6 9.0 9.3 9.5 7.7  NEUTROABS 4.3 6.1  --   --   --   --   HGB 16.9* 17.4* 17.0* 16.4* 16.9* 16.7*  HCT 53.1* 57.0* 57.7* 55.4* 56.1* 55.2*  MCV 87.5 90.3 94.0 91.3 89.2 88.2  PLT 264.0 274 244 226 263 564   Basic Metabolic Panel: Recent Labs  Lab 11/05/18 0928 11/06/18 0307 11/07/18 0209 11/08/18 0532 11/09/18 0507  NA 140 137 134* 135 136  K 4.9 4.0  4.0 3.6 3.6  CL 89* 86* 81* 86* 85*  CO2 38* 37* 40* 37* 38*  GLUCOSE 103* 98 110* 115* 96  BUN 21 27* 30* 30* 31*  CREATININE 1.12* 0.92 1.01* 0.94 0.79  CALCIUM 8.7* 8.4* 8.8* 9.1 9.0  MG  --  2.2 2.5* 2.4 2.6*   GFR: Estimated Creatinine Clearance: 68.2 mL/min (by C-G formula based on SCr of 0.79 mg/dL). Liver Function Tests: Recent Labs  Lab 11/04/18 1105  AST 40  ALT 52*  ALKPHOS 115  BILITOT 0.6  PROT 7.4  ALBUMIN 3.6   No results for input(s): LIPASE, AMYLASE in the last 168 hours. No  results for input(s): AMMONIA in the last 168 hours. Coagulation Profile: No results for input(s): INR, PROTIME in the last 168 hours. Cardiac Enzymes: No results for input(s): CKTOTAL, CKMB, CKMBINDEX, TROPONINI in the last 168 hours. BNP (last 3 results) No results for input(s): PROBNP in the last 8760 hours. HbA1C: No results for input(s): HGBA1C in the last 72 hours. CBG: Recent Labs  Lab 11/08/18 1153 11/08/18 1618 11/08/18 2203 11/09/18 0751 11/09/18 1137  GLUCAP 130* 132* 115* 87 110*   Lipid Profile: No results for input(s): CHOL, HDL, LDLCALC, TRIG, CHOLHDL, LDLDIRECT in the last 72 hours. Thyroid Function Tests: No results for input(s): TSH, T4TOTAL, FREET4, T3FREE, THYROIDAB in the last 72 hours. Anemia Panel: No results for input(s): VITAMINB12, FOLATE, FERRITIN, TIBC, IRON, RETICCTPCT in the last 72 hours. Sepsis Labs: No results for input(s): PROCALCITON, LATICACIDVEN in the last 168 hours.  Recent Results (from the past 240 hour(s))  SARS Coronavirus 2 (CEPHEID - Performed in [facility] hospital lab), Hosp Order     Status: None   Collection Time: 11/04/18 11:02 AM   Specimen: Nasopharyngeal Swab  Result Value Ref Range Status   SARS Coronavirus 2 NEGATIVE NEGATIVE Final    Comment: (NOTE) If result is NEGATIVE SARS-CoV-2 target nucleic acids are NOT DETECTED. The SARS-CoV-2 RNA is generally detectable in upper and lower  respiratory specimens during the acute phase of infection. The lowest  concentration of SARS-CoV-2 viral copies this assay can detect is 250  copies / mL. A negative result does not preclude SARS-CoV-2 infection  and should not be used as the sole basis for treatment or other  patient management decisions.  A negative result may occur with  improper specimen collection / handling, submission of specimen other  than nasopharyngeal swab, presence of viral mutation(s) within the  areas targeted by this assay, and inadequate number of viral  copies  (<250 copies / mL). A negative result must be combined with clinical  observations, patient history, and epidemiological information. If result is POSITIVE SARS-CoV-2 target nucleic acids are DETECTED. The SARS-CoV-2 RNA is generally detectable in upper and lower  respiratory specimens dur ing the acute phase of infection.  Positive  results are indicative of active infection with SARS-CoV-2.  Clinical  correlation with patient history and other diagnostic information is  necessary to determine patient infection status.  Positive results do  not rule out bacterial infection or co-infection with other viruses. If result is PRESUMPTIVE POSTIVE SARS-CoV-2 nucleic acids MAY BE PRESENT.   A presumptive positive result was obtained on the submitted specimen  and confirmed on repeat testing.  While 2019 novel coronavirus  (SARS-CoV-2) nucleic acids may be present in the submitted sample  additional confirmatory testing may be necessary for epidemiological  and / or clinical management purposes  to differentiate between  SARS-CoV-2 and other  Sarbecovirus currently known to infect humans.  If clinically indicated additional testing with an alternate test  methodology [phone]) is advised. The SARS-CoV-2 RNA is generally  detectable in upper and lower respiratory sp ecimens during the acute  phase of infection. The expected result is Negative. Fact Sheet for Patients:  StrictlyIdeas.no Fact Sheet for Healthcare Providers: BankingDealers.co.za This test is not yet approved or cleared by the [place] FDA and has been authorized for detection and/or diagnosis of SARS-CoV-2 by FDA under an Emergency Use Authorization (EUA).  This EUA will remain in effect (meaning this test can be used) for the duration of the COVID-19 declaration under Section 564(b)(1) of the Act, 21 U.S.C. section 360bbb-3(b)(1), unless the authorization is terminated  or revoked sooner. Performed at [facility], [place] [address], [place], [place] [address]   Culture, Urine     Status: None   Collection Time: 11/05/18  5:56 AM   Specimen: Urine, Catheterized  Result Value Ref Range Status   Specimen Description   Final    URINE, CATHETERIZED Performed at [facility] [address] [place], [place] [address]    Special Requests   Final    NONE Performed at [facility], [place] [address], [place], [place] [address]    Culture   Final    NO GROWTH Performed at [facility], [place] [address], [place], [place] [address]    Report Status 11/06/2018 FINAL  Final  MRSA PCR Screening     Status: None   Collection Time: 11/05/18  6:52 AM   Specimen: Nasal Mucosa; Nasopharyngeal  Result Value Ref Range Status   MRSA by PCR NEGATIVE NEGATIVE Final    Comment:        The GeneXpert MRSA Assay (FDA approved for NASAL specimens only), is one component of a comprehensive MRSA colonization surveillance program. It is not intended to diagnose MRSA infection nor to guide or monitor treatment for MRSA infections. Performed at [facility], [place] [address] [place],  [address]          Radiology Studies: Dg Chest Port 1 View  Result Date: 11/08/2018 CLINICAL DATA:  Shortness of breath. Congestive heart failure. EXAM: PORTABLE CHEST 1 VIEW COMPARISON:  11/06/2018 FINDINGS: Stable mild cardiomegaly. Aortic atherosclerosis. Pulmonary hyperinflation again seen, consistent with COPD. No evidence of pulmonary infiltrate or edema. No evidence of pleural effusion. IMPRESSION: Stable mild cardiomegaly and COPD. No active lung disease. Electronically Signed   By: Earle Gell M.D.   On: 11/08/2018 07:37        Scheduled Meds: . allopurinol  100 mg Oral Daily  . aspirin EC  81 mg Oral Daily  . chlorhexidine  15 mL Mouth Rinse BID  . Chlorhexidine Gluconate Cloth  6 each  Topical Daily  . enoxaparin (LOVENOX) injection  40 mg Subcutaneous Q24H  . furosemide  40 mg Intravenous Q8H  . insulin aspart  0-9 Units Subcutaneous TID WC  . ipratropium-albuterol  3 mL Nebulization TID  . mouth rinse  15 mL Mouth Rinse q12n4p  . metoprolol tartrate  12.5 mg Oral BID  . montelukast  10 mg Oral QHS  . nicotine  21 mg Transdermal Daily  . simvastatin  40 mg Oral QHS   Continuous Infusions:   LOS: 5 days    Time spent: 28 minutes    J [place] DO [facility] Pager [phone]  If 7PM-7AM, please contact night-coverage [email or web address] Password [email or web address] 11/09/2018,  11:50 AM

## 2018-11-09 NOTE — Progress Notes (Signed)
Physical Therapy Treatment Patient Details Name: Rebecca Mcintyre MRN: [id number] DOB: 10-Mar-1946 Today's Date: 11/09/2018    History of Present Illness 73 yo female admitted with acute respiratory failure with hypoxia.    PT Comments    Progressing with mobility. SaO2 with ambulation: 87% on 3L; 91% on 4L.   Follow Up Recommendations  Home health PT     Equipment Recommendations  Other (comment)(4 wheeled rolling walker)    Recommendations for Other Services       Precautions / Restrictions Precautions Precautions: Fall Precaution Comments: monitor O2 Restrictions Weight Bearing Restrictions: No    Mobility  Bed Mobility Overal bed mobility: Modified Independent             General bed mobility comments: HOB up, used rail  Transfers Overall transfer level: Needs assistance Equipment used: None Transfers: Sit to/from Stand           General transfer comment: close guard for safety.  Ambulation/Gait Ambulation/Gait assistance: Min guard Gait Distance (Feet): 350 Feet Assistive device: 4-wheeled walker;None Gait Pattern/deviations: Step-through pattern     General Gait Details: walked ~10 feet in room without a device. Used rollator in hallway. 1 seated rest break taken. Dyspnea 2/4. SaO2 87% on 3L; 91% on 4L. Pt tolerated activity well.   Stairs             Wheelchair Mobility    Modified Rankin (Stroke Patients Only)       Balance Overall balance assessment: Mild deficits observed, not formally tested                                          Cognition Arousal/Alertness: Awake/alert Behavior During Therapy: WFL for tasks assessed/performed Overall Cognitive Status: Within Functional Limits for tasks assessed                                        Exercises      General Comments        Pertinent Vitals/Pain Pain Assessment: No/denies pain    Home Living                      Prior  Function            PT Goals (current goals can now be found in the care plan section) Progress towards PT goals: Progressing toward goals    Frequency    Min 3X/week      PT Plan Current plan remains appropriate    Co-evaluation              AM-PAC PT "6 Clicks" Mobility   Outcome Measure  Help needed turning from your back to your side while in a flat bed without using bedrails?: A Little Help needed moving from lying on your back to sitting on the side of a flat bed without using bedrails?: A Little Help needed moving to and from a bed to a chair (including a wheelchair)?: A Little Help needed standing up from a chair using your arms (e.g., wheelchair or bedside chair)?: A Little Help needed to walk in hospital room?: A Little Help needed climbing 3-5 steps with a railing? : A Little 6 Click Score: 18    End of Session Equipment Utilized During Treatment: Oxygen Activity Tolerance:  Patient tolerated treatment well Patient left: in chair;with call bell/phone within reach   PT Visit Diagnosis: Difficulty in walking, not elsewhere classified (R26.2)     Time: 1010-1040 PT Time Calculation (min) (ACUTE ONLY): 30 min  Charges:  $Gait Training: 23-37 mins                        Weston Anna, PT Acute Rehabilitation Services Pager: [phone] Office: [phone]

## 2018-11-09 NOTE — Care Management Important Message (Signed)
Important Message  Patient Details IM Letter given to Rhea Pink SW to present to the Patient Name: Rebecca Mcintyre MRN: [id number] Date of Birth: 1945-11-08   Medicare Important Message Given:  Yes    Kerin Salen 11/09/2018, 12:39 PM

## 2018-11-10 LAB — GLUCOSE, CAPILLARY
Glucose-Capillary: 118 mg/dL — ABNORMAL HIGH (ref 70–99)
Glucose-Capillary: 107 mg/dL — ABNORMAL HIGH (ref 70–99)
Glucose-Capillary: 155 mg/dL — ABNORMAL HIGH (ref 70–99)
Glucose-Capillary: 119 mg/dL — ABNORMAL HIGH (ref 70–99)

## 2018-11-10 LAB — BASIC METABOLIC PANEL
Anion gap: 15 (ref 5–15)
BUN: 37 mg/dL — ABNORMAL HIGH (ref 8–23)
CO2: 36 mmol/L — ABNORMAL HIGH (ref 22–32)
Calcium: 9.2 mg/dL (ref 8.9–10.3)
Chloride: 87 mmol/L — ABNORMAL LOW (ref 98–111)
Creatinine, Ser: 0.82 mg/dL (ref 0.44–1.00)
GFR calc Af Amer: >60 mL/min (ref >60)
GFR calc non Af Amer: >60 mL/min (ref >60)
Glucose, Bld: 94 mg/dL (ref 70–99)
Potassium: 3.4 mmol/L — ABNORMAL LOW (ref 3.5–5.1)
Sodium: 138 mmol/L (ref 135–145)

## 2018-11-10 LAB — MAGNESIUM: Magnesium: 2.7 mg/dL — ABNORMAL HIGH (ref 1.7–2.4)

## 2018-11-10 MED ORDER — POTASSIUM CHLORIDE CRYS ER 20 MEQ PO TBCR
40.0000 meq | EXTENDED_RELEASE_TABLET | Freq: Once | ORAL | Status: AC
  Administered 2018-11-10: 40 meq via ORAL
  Filled 2018-11-10: qty 2

## 2018-11-10 NOTE — Progress Notes (Signed)
PROGRESS NOTE    JAMILE REKOWSKI  XIP:[id number] DOB: 11-15-1945 DOA: 11/04/2018 PCP: Burnis Medin, MD    Brief Narrative:   Rebecca Mcintyre is a 73 y.o. female with medical history significant of CAD, remote stent to LAD in 2002, tobacco abuse, dyslipidemia, obesity presented to the ED with shortness of breath and possible syncopal episode.  Patient reports progressive lower extremity swelling and dyspnea over the past 2 weeks; requiring her to go sleep almost in the upright position.  Also reports associated abdominal tightness.  Early on the morning of admission, roughly 5 AM she remembers walking up to the bathroom, apparently fell and does not recall events and woke up on the floor. She denies any chest pain palpitations, no fevers or chills, no cough congestion etc.  Patient was noted to be hypoxic with sats in the mid 80s which improved with 4 liters of O2, chest x-ray showed COPD changes, CT angiogram was negative for pulmonary embolism.  Patient referred for admission by EDP for further evaluation and treatment of acute hypoxic respiratory failure likely secondary to CHF exacerbation.   Assessment & Plan:   Principal Problem:   Acute respiratory failure with hypoxia and hypercapnia (HCC) Active Problems:   GOUT   OBESITY   Essential hypertension   TOBACCO ABUSE, HX OF   Pre-diabetes   Acute CHF (congestive heart failure) (HCC)   CHF (congestive heart failure) (HCC)   Type 2 diabetes mellitus without complication (HCC)   Acute on chronic diastolic CHF (congestive heart failure) (HCC)   Acute hypoxic respiratory failure with hypercapnia Acute on chronic diastolic congestive heart failure Likely underlying obstructive sleep apnea Presenting from home with a 2-week history of progressive dyspnea, lower extremity edema and abdominal fullness/tightness.  BNP on admission elevated at 586.9.  COVID-19 test negative.  D-dimer elevated 0.81.  Chest x-ray with emphysematous changes  consistent with COPD.  CT angiogram PE study negative for PE but noted cardiomegaly.  Transthoracic echocardiogram with EF 67-34%, diastolic dysfunction, RV reduced systolic function, RV systolic pressure elevated consistent with volume overload, IVC dilated. ABG, pH 7.27, PaCO2 85, PaO2 175, HCO3 38.5.  --Slowly improving, continues to require 4LNC at rest with significant de-saturation on ambulation --net negative 1.5LmL past 24h; negative 8.5L since admission --continue Furosemide 40 mg IV q8h --Continue to titrate supplemental oxygen to maintain SPO2 greater than 90% --Daily weights and strict I's and O's --Low-salt diet, consistent carbohydrate --Likely would benefit from outpatient sleep study and referral to outpatient pulmonology following discharge. --Supportive care  Confusion Syncopal episode Patient reported syncopal episode while going to the bathroom prior to presentation.  Patient has been more confused per daughter.  Etiology likely from hypoxia which she is experiencing over the past 2 weeks with etiology likely underlying acute on chronic congestive heart failure.  CT head without contrast negative for acute intracranial pathology, did note left posterior scalp swelling without underlying fracture.  ABG on admission noted with elevated PaCO2 of 85.  Echo with normal EF.  CTPA negative for pulmonary embolism.  TSH within normal limits at 1.266. --mentation now back at baseline. Likely 2/2 hypoxia and elevated CO2 --Continue diuresis, supplemental oxygen --Supportive care  Type 2 diabetes mellitus Hemoglobin A1c 7.0 on 11/03/2018, well controlled.  Home regimen includes metformin 500 mg p.o. twice daily. --Hold oral hypoglycemics while inpatient --Consistent carbohydrate diet --insulin Sliding scale for coverage  Suspect underlying COPD Hx tobacco abuse disorder History of significant tobacco abuse.  Chest x-ray findings suggestive  of emphysematous changes consistent with  underlying COPD.  No PFTs available for review in EMR.  Patient on montelukast 10 mg p.o. daily and albuterol inhaler as needed at home. --continue nebs as needed  --Counseled on tobacco cessation --likely benefit from LAMA/LABA on discharge --Would benefit from pulmonary outpatient follow-up with PFTs --Nicotine patch  Morbid obesity BMI 36.75.  Patient will need aggressive weight loss measures in the future as this complicates all facets of care  Hx CAD Remote LAD stent in 2002. --Continue aspirin, metoprolol and statin  Hypokalemia Potassium 3.4, magnesium 2.7.  Will replete potassium today. --Pete electrolytes to include magnesium in the a.m.  Weakness/debility: --PT following, currently recommending HHPT and 4 wheeled walker --continue mobilization efforts with attempts at weaning oxygen daily   DVT prophylaxis: Lovenox Code Status: Full code Family Communication: none Disposition Plan: continue inpatient, further dependent on clinical course. Likely home with home health when stable   Consultants:   none  Procedures:   BiPAP 6/11 - 6/12; now prn  Antimicrobials:  none   Subjective:  Patient seen and examined at bedside.  Resting comfortably.  Continues nasal cannula 3 L/min.  Lower extremity edema much improved.  Reports good sleep overnight.  Continues with desaturation on ambulation, but dyspnea symptoms and weakness improving daily.  No other complaints or concerns at this time.  Denies headache, no fever/chills/night sweats, no nausea/vomiting/diarrhea, no chest pain, no palpitations, no abdominal pain, no cough/congestion, no paresthesias.  No acute events overnight per nursing staff.  Objective: Vitals:   11/10/18 0456 11/10/18 0720 11/10/18 0924 11/10/18 1130  BP: (!) 113/53  116/61   Pulse: 73  91   Resp: 20     Temp: 98.2 F (36.8 C)     TempSrc: Oral     SpO2: 97% 91%  (!) 88%  Weight:      Height:        Intake/Output Summary (Last 24  hours) at 11/10/2018 1225 Last data filed at 11/10/2018 1130 Gross per 24 hour  Intake 720 ml  Output 2450 ml  Net -1730 ml   Filed Weights   11/08/18 0500 11/09/18 0458 11/10/18 0450  Weight: 91.3 kg 91.5 kg 91.1 kg    Examination:  General exam: Appears calm and comfortable, on 3 L nasal cannula Respiratory system: Mild late expiratory wheezing bilateral bases, normal respiratory effort, on 3 L nasal cannula Cardiovascular system: S1 & S2 heard, RRR. No JVD, murmurs, rubs, gallops or clicks.   trace pitting edema up to bilateral ankles Gastrointestinal system: Abdomen is protuberant, nondistended, soft and nontender. No organomegaly or masses felt. Normal bowel sounds heard. Central nervous system: Alert, No focal neurological deficits. Extremities: Symmetric 5 x 5 power. Skin: No rashes, lesions or ulcers    Data Reviewed: I have personally reviewed following labs and imaging studies  CBC: Recent Labs  Lab 11/04/18 1105 11/05/18 0928 11/06/18 0307 11/07/18 0209 11/08/18 0532  WBC 9.6 9.0 9.3 9.5 7.7  NEUTROABS 6.1  --   --   --   --   HGB 17.4* 17.0* 16.4* 16.9* 16.7*  HCT 57.0* 57.7* 55.4* 56.1* 55.2*  MCV 90.3 94.0 91.3 89.2 88.2  PLT 274 244 226 263 798   Basic Metabolic Panel: Recent Labs  Lab 11/06/18 0307 11/07/18 0209 11/08/18 0532 11/09/18 0507 11/10/18 0505  NA 137 134* 135 136 138  K 4.0 4.0 3.6 3.6 3.4*  CL 86* 81* 86* 85* 87*  CO2 37* 40* 37* 38* 36*  GLUCOSE 98 110* 115* 96 94  BUN 27* 30* 30* 31* 37*  CREATININE 0.92 1.01* 0.94 0.79 0.82  CALCIUM 8.4* 8.8* 9.1 9.0 9.2  MG 2.2 2.5* 2.4 2.6* 2.7*   GFR: Estimated Creatinine Clearance: 66.5 mL/min (by C-G formula based on SCr of 0.82 mg/dL). Liver Function Tests: Recent Labs  Lab 11/04/18 1105  AST 40  ALT 52*  ALKPHOS 115  BILITOT 0.6  PROT 7.4  ALBUMIN 3.6   No results for input(s): LIPASE, AMYLASE in the last 168 hours. No results for input(s): AMMONIA in the last 168  hours. Coagulation Profile: No results for input(s): INR, PROTIME in the last 168 hours. Cardiac Enzymes: No results for input(s): CKTOTAL, CKMB, CKMBINDEX, TROPONINI in the last 168 hours. BNP (last 3 results) No results for input(s): PROBNP in the last 8760 hours. HbA1C: No results for input(s): HGBA1C in the last 72 hours. CBG: Recent Labs  Lab 11/09/18 1137 11/09/18 1638 11/09/18 2142 11/10/18 0801 11/10/18 1200  GLUCAP 110* 97 124* 118* 107*   Lipid Profile: No results for input(s): CHOL, HDL, LDLCALC, TRIG, CHOLHDL, LDLDIRECT in the last 72 hours. Thyroid Function Tests: No results for input(s): TSH, T4TOTAL, FREET4, T3FREE, THYROIDAB in the last 72 hours. Anemia Panel: No results for input(s): VITAMINB12, FOLATE, FERRITIN, TIBC, IRON, RETICCTPCT in the last 72 hours. Sepsis Labs: No results for input(s): PROCALCITON, LATICACIDVEN in the last 168 hours.  Recent Results (from the past 240 hour(s))  SARS Coronavirus 2 (CEPHEID - Performed in [facility] hospital lab), Hosp Order     Status: None   Collection Time: 11/04/18 11:02 AM   Specimen: Nasopharyngeal Swab  Result Value Ref Range Status   SARS Coronavirus 2 NEGATIVE NEGATIVE Final    Comment: (NOTE) If result is NEGATIVE SARS-CoV-2 target nucleic acids are NOT DETECTED. The SARS-CoV-2 RNA is generally detectable in upper and lower  respiratory specimens during the acute phase of infection. The lowest  concentration of SARS-CoV-2 viral copies this assay can detect is 250  copies / mL. A negative result does not preclude SARS-CoV-2 infection  and should not be used as the sole basis for treatment or other  patient management decisions.  A negative result may occur with  improper specimen collection / handling, submission of specimen other  than nasopharyngeal swab, presence of viral mutation(s) within the  areas targeted by this assay, and inadequate number of viral copies  (<250 copies / mL). A negative result  must be combined with clinical  observations, patient history, and epidemiological information. If result is POSITIVE SARS-CoV-2 target nucleic acids are DETECTED. The SARS-CoV-2 RNA is generally detectable in upper and lower  respiratory specimens dur ing the acute phase of infection.  Positive  results are indicative of active infection with SARS-CoV-2.  Clinical  correlation with patient history and other diagnostic information is  necessary to determine patient infection status.  Positive results do  not rule out bacterial infection or co-infection with other viruses. If result is PRESUMPTIVE POSTIVE SARS-CoV-2 nucleic acids MAY BE PRESENT.   A presumptive positive result was obtained on the submitted specimen  and confirmed on repeat testing.  While 2019 novel coronavirus  (SARS-CoV-2) nucleic acids may be present in the submitted sample  additional confirmatory testing may be necessary for epidemiological  and / or clinical management purposes  to differentiate between  SARS-CoV-2 and other Sarbecovirus currently known to infect humans.  If clinically indicated additional testing with an alternate test  methodology [phone]) is  advised. The SARS-CoV-2 RNA is generally  detectable in upper and lower respiratory sp ecimens during the acute  phase of infection. The expected result is Negative. Fact Sheet for Patients:  StrictlyIdeas.no Fact Sheet for Healthcare Providers: BankingDealers.co.za This test is not yet approved or cleared by the [place] FDA and has been authorized for detection and/or diagnosis of SARS-CoV-2 by FDA under an Emergency Use Authorization (EUA).  This EUA will remain in effect (meaning this test can be used) for the duration of the COVID-19 declaration under Section 564(b)(1) of the Act, 21 U.S.C. section 360bbb-3(b)(1), unless the authorization is terminated or revoked sooner. Performed at [facility], [place] [address] [place], [place] [address]   Culture, Urine     Status: None   Collection Time: 11/05/18  5:56 AM   Specimen: Urine, Catheterized  Result Value Ref Range Status   Specimen Description   Final    URINE, CATHETERIZED Performed at [facility] [address], [place], [place] [address]    Special Requests   Final    NONE Performed at [facility], [place] [address] [place], [place] [address]    Culture   Final    NO GROWTH Performed at [facility], [place] [address], [place], [place] [address]    Report Status 11/06/2018 FINAL  Final  MRSA PCR Screening     Status: None   Collection Time: 11/05/18  6:52 AM   Specimen: Nasal Mucosa; Nasopharyngeal  Result Value Ref Range Status   MRSA by PCR NEGATIVE NEGATIVE Final    Comment:        The GeneXpert MRSA Assay (FDA approved for NASAL specimens only), is one component of a comprehensive MRSA colonization surveillance program. It is not intended to diagnose MRSA infection nor to guide or monitor treatment for MRSA infections. Performed at [facility], [place] [address], [place], [place] [address]          Radiology Studies: No results found.      Scheduled Meds: . allopurinol  100 mg Oral Daily  . aspirin EC  81 mg Oral Daily  . chlorhexidine  15 mL Mouth Rinse BID  . enoxaparin (LOVENOX) injection  40 mg Subcutaneous Q24H  . furosemide  40 mg Intravenous Q8H  . insulin aspart  0-9 Units Subcutaneous TID WC  . ipratropium-albuterol  3 mL Nebulization TID  . mouth rinse  15 mL Mouth Rinse q12n4p  . metoprolol tartrate  12.5 mg Oral BID  . montelukast  10 mg Oral QHS  . nicotine  21 mg Transdermal Daily  . simvastatin  40 mg Oral QHS   Continuous Infusions:   LOS: 6 days    Time spent: 28 minutes   Eric J [place] DO [facility] Pager [phone]  If 7PM-7AM, please contact  night-coverage [email or web address] Password [facility] 11/10/2018, 12:25 PM

## 2018-11-11 ENCOUNTER — Inpatient Hospital Stay (HOSPITAL_COMMUNITY): Payer: Medicare Other

## 2018-11-11 DIAGNOSIS — I5033 Acute on chronic diastolic (congestive) heart failure: Secondary | ICD-10-CM

## 2018-11-11 LAB — BASIC METABOLIC PANEL
Anion gap: 15 (ref 5–15)
BUN: 35 mg/dL — ABNORMAL HIGH (ref 8–23)
CO2: 36 mmol/L — ABNORMAL HIGH (ref 22–32)
Calcium: 9.1 mg/dL (ref 8.9–10.3)
Chloride: 87 mmol/L — ABNORMAL LOW (ref 98–111)
Creatinine, Ser: 0.76 mg/dL (ref 0.44–1.00)
GFR calc Af Amer: >60 mL/min (ref >60)
GFR calc non Af Amer: >60 mL/min (ref >60)
Glucose, Bld: 105 mg/dL — ABNORMAL HIGH (ref 70–99)
Potassium: 3.6 mmol/L (ref 3.5–5.1)
Sodium: 138 mmol/L (ref 135–145)

## 2018-11-11 LAB — CBC WITH DIFFERENTIAL/PLATELET
Abs Immature Granulocytes: 0.03 10*3/uL (ref 0.00–0.07)
Basophils Absolute: 0.1 10*3/uL (ref 0.0–0.1)
Basophils Relative: 1 %
Eosinophils Absolute: 0.1 10*3/uL (ref 0.0–0.5)
Eosinophils Relative: 2 %
HCT: 55.2 % — ABNORMAL HIGH (ref 36.0–46.0)
Hemoglobin: 16.4 g/dL — ABNORMAL HIGH (ref 12.0–15.0)
Immature Granulocytes: 0 %
Lymphocytes Relative: 28 %
Lymphs Abs: 2.4 10*3/uL (ref 0.7–4.0)
MCH: 26.7 pg (ref 26.0–34.0)
MCHC: 29.7 g/dL — ABNORMAL LOW (ref 30.0–36.0)
MCV: 89.9 fL (ref 80.0–100.0)
Monocytes Absolute: 1.3 10*3/uL — ABNORMAL HIGH (ref 0.1–1.0)
Monocytes Relative: 15 %
Neutro Abs: 4.7 10*3/uL (ref 1.7–7.7)
Neutrophils Relative %: 54 %
Platelets: 211 10*3/uL (ref 150–400)
RBC: 6.14 MIL/uL — ABNORMAL HIGH (ref 3.87–5.11)
RDW: 14.8 % (ref 11.5–15.5)
WBC: 8.6 10*3/uL (ref 4.0–10.5)
nRBC: 0 % (ref 0.0–0.2)

## 2018-11-11 LAB — PHOSPHORUS: Phosphorus: 4.1 mg/dL (ref 2.5–4.6)

## 2018-11-11 LAB — GLUCOSE, CAPILLARY
Glucose-Capillary: 95 mg/dL (ref 70–99)
Glucose-Capillary: 124 mg/dL — ABNORMAL HIGH (ref 70–99)
Glucose-Capillary: 106 mg/dL — ABNORMAL HIGH (ref 70–99)
Glucose-Capillary: 132 mg/dL — ABNORMAL HIGH (ref 70–99)

## 2018-11-11 LAB — MAGNESIUM: Magnesium: 2.5 mg/dL — ABNORMAL HIGH (ref 1.7–2.4)

## 2018-11-11 LAB — HEPATIC FUNCTION PANEL
ALT: 37 U/L (ref 0–44)
AST: 33 U/L (ref 15–41)
Albumin: 3.4 g/dL — ABNORMAL LOW (ref 3.5–5.0)
Alkaline Phosphatase: 98 U/L (ref 38–126)
Bilirubin, Direct: 0.1 mg/dL (ref 0.0–0.2)
Indirect Bilirubin: 0.7 mg/dL (ref 0.3–0.9)
Total Bilirubin: 0.8 mg/dL (ref 0.3–1.2)
Total Protein: 7 g/dL (ref 6.5–8.1)

## 2018-11-11 MED ORDER — GUAIFENESIN ER 600 MG PO TB12
1200.0000 mg | ORAL_TABLET | Freq: Two times a day (BID) | ORAL | Status: DC
  Administered 2018-11-11 – 2018-11-12 (×2): 1200 mg via ORAL
  Filled 2018-11-11 (×2): qty 2

## 2018-11-11 MED ORDER — IPRATROPIUM-ALBUTEROL 0.5-2.5 (3) MG/3ML IN SOLN
3.0000 mL | Freq: Two times a day (BID) | RESPIRATORY_TRACT | Status: DC
  Administered 2018-11-11 – 2018-11-12 (×3): 3 mL via RESPIRATORY_TRACT
  Filled 2018-11-11 (×3): qty 3

## 2018-11-11 MED ORDER — POTASSIUM CHLORIDE CRYS ER 20 MEQ PO TBCR
40.0000 meq | EXTENDED_RELEASE_TABLET | Freq: Once | ORAL | Status: AC
  Administered 2018-11-11: 40 meq via ORAL
  Filled 2018-11-11: qty 2

## 2018-11-11 NOTE — Progress Notes (Signed)
PROGRESS NOTE    Rebecca Mcintyre  ZTI:[id number] DOB: Nov 13, 1945 DOA: 11/04/2018 PCP: Burnis Medin, MD   Brief Narrative:  HPI per Dr. Domenic Polite on 11/04/2018  Rebecca Mcintyre is a 73 y.o. female with medical history significant of CAD, remote stent to LAD in 2002, tobacco abuse, dyslipidemia, obesity presented to the ED with the above complaints. -Patient reports did progressive Rebecca Mcintyre worsening dyspnea on exertion for the last 2 weeks, associated with swelling of her feet and ankles, abdominal tightness etc. -Early this morning around 5 AM she remembers waking up to go to the bathroom, next thing she knows she is on the floor, woke up hitting her head. -She denies any chest pain palpitations, no fevers or chills, no cough congestion etc. ED Course: Patient was noted to be hypoxic with sats in the mid 80s which improved with few liters of O2, chest x-ray showed COPD changes, CT angiogram was negative for pulmonary embolism  **Interim History Patient was admitted for the treatment of acute hypoxic respiratory failure likely secondary CHF exacerbation.  She continues to be on oxygen but this is slowly weaning.  Continuing diuresis as well.  She has never seen pulmonary in outpatient setting but had a referral to them.  Assessment & Plan:   Principal Problem:   Acute respiratory failure with hypoxia and hypercapnia (HCC) Active Problems:   GOUT   OBESITY   Essential hypertension   TOBACCO ABUSE, HX OF   Pre-diabetes   Acute CHF (congestive heart failure) (HCC)   CHF (congestive heart failure) (HCC)   Type 2 diabetes mellitus without complication (HCC)   Acute on chronic diastolic CHF (congestive heart failure) (HCC)  Acute hypoxic respiratory failure with hypercapnia Acute on chronic diastolic congestive heart failure Likely underlying obstructive sleep apnea -Presenting from home with a 2-week history of progressive dyspnea, lower extremity edema and abdominal fullness/tightness.    -BNP on admission elevated at 586.9.   -COVID-19 test negative.  D-dimer elevated 0.81.   -Chest x-ray with emphysematous changes consistent with COPD.   -CT angiogram PE study negative for PE but noted cardiomegaly. -Transthoracic echocardiogram with EF 82-50%, diastolic dysfunction, RV reduced systolic function, RV systolic pressure elevated consistent with volume overload, IVC dilated. ABG, pH 7.27, PaCO2 85, PaO2 175, HCO3 38.5.  -Repeat ABG in AM  -Slowly improving, continues to require 3LNC (down from 4L) at rest with significant de-saturation on ambulation -Patient is -9.390 Liters since Admission  -Will Continue IV Furosemide 40 mg IV q8h -Continue to titrate supplemental oxygen to maintain SPO2 greater than 90% -Daily weights and strict I's and O's -Low-salt diet, consistent carbohydrate with 1500 mL Fluid Restriction -Likely would benefit from outpatient sleep study and referral to outpatient pulmonology following discharge but will consult in-house Pulmonary for further evaluation and Assistance -C/w Albuterol Nebs 3 mL Neb q6hprn Wheezing and SOB; C/w DuoNeb 3 mL BID  -Will add Flutter Valve and Incentive Spirometry along with Pulmonary Toilet  -SARS-CoV-2 Testing Negative  -Repeat CXR this AM showed mild left midlung field linear density most likely subsegmental atelectasis and chronic interstitial lung disease -Will add Guaifenesin 1200 mg po BID  Confusion and Syncopal episode -Patient reported syncopal episode while going to the bathroom prior to presentation.   -Patient has been more confused per daughter.   -Etiology likely from hypoxia which she is experiencing over the past 2 weeks with etiology likely underlying acute on chronic congestive heart failure.   -CT head without contrast negative for  acute intracranial pathology, did note left posterior scalp swelling without underlying fracture.   -ABG on admission noted with elevated PaCO2 of 85.  Echo with normal EF.    -CTPA negative for pulmonary embolism.   -TSH within normal limits at 1.266. -Mentation now back at baseline. Likely 2/2 hypoxia and elevated CO2 -Continue diuresis, supplemental oxygen -C/w Supportive care  Type 2 Diabetes Mellitus -Hemoglobin A1c 7.0 on 11/03/2018, well controlled.  Home regimen includes metformin 500 mg p.o. twice daily. -Hold oral hypoglycemics while inpatient -Consistent carbohydrate diet -C/w Sensitive Novolog Sliding Insulin Sliding AC scale for coverage  Suspect underlying COPD and Chronic Interstitial Lung Disease  Hx tobacco abuse disorder -History of significant tobacco abuse.   -Chest x-ray findings suggestive of emphysematous changes consistent with underlying COPD.  No PFTs available for review in EMR.  -Patient on montelukast 10 mg p.o. daily and albuterol inhaler as needed at home. -Continue nebs scheduled and as as needed  -Counseled on tobacco cessation and will continue Nicotine Patch  -Likely benefit from LAMA/LABA on discharge -Would benefit from pulmonary outpatient follow-up with PFTs but will consult In House Pulmonary for further evaluation and recommendations  -Repeat CXR showed mild left midlung field linear density most likely subsegmental atelectasis and chronic interstitial lung diseas  Obesity -Estimated body mass index is 35.73 kg/m as calculated from the following:   Height as of this encounter: _0  (1.6 m).   Weight as of this encounter: 91.5 kg. -Weight loss and Dietary Counseling given  -Patient will need aggressive weight loss measures in the future as this complicates all facets of care  Hx CAD -Remote LAD stent in 2002. -Continue Aspirin 81 mg po Daily, Metoprolol 12.5 mg po BID and Simvastatin 40 mg po qHS  Hypokalemia -Improved. K+ is 3.6 -Continue to Monitor and Replete as Necessary -Repeat CMP in AM .  Generalized Weakness/ Physical Debility: -PT following, currently recommending HHPT and 4 wheeled  walker -Continue mobilization efforts with attempts at weaning oxygen daily  Erythrocytosis -In the setting of Tobacco Abuse and Diuresis  -Patient's Hb/Hct is now 16.4/55.2 -Continue to Monitor and Trend -Repeat CBC in AM   DVT prophylaxis: Enoxaparin 40 mg sq q24h Code Status: FULL CODE  Family Communication: Attempted to Update Daughter over the phone but did not answer and went straight to VM Disposition Plan: Home Health PT when medically stable to D/C Home   Consultants:   None   Procedures:  None  Antimicrobials:  Anti-infectives (From admission, onward)   None     Subjective: Seen and examined at bedside is happy that she was able to have her oxygen weaned down to 3 L.  Denies any chest pain, lightheadedness or dizziness.[name]  Hopeful that she can get off of oxygen.  Still felt a little dyspneic.  No nausea or vomiting.  No other concerns or plans at this time  Objective: Vitals:   11/10/18 2100 11/10/18 2102 11/11/18 0431 11/11/18 0715  BP: 127/76  (!) 118/45   Pulse: 97  71   Resp: (!) 24  (!) 24   Temp: 98.2 F (36.8 C)  98.5 F (36.9 C)   TempSrc: Oral     SpO2: 90%  92% 91%  Weight:  91.5 kg    Height:        Intake/Output Summary (Last 24 hours) at 11/11/2018 0827 Last data filed at 11/11/2018 0435 Gross per 24 hour  Intake 1180 ml  Output 1600 ml  Net -420 ml  Filed Weights   11/09/18 0458 11/10/18 0450 11/10/18 2102  Weight: 91.5 kg 91.1 kg 91.5 kg    Examination: Physical Exam:  Constitutional: WN/WD obese Caucasian female in NAD and appears calm and comfortable sitting in the Chair bedside Eyes: Lids and conjunctivae normal, sclerae anicteric  ENMT: External Ears, Nose appear normal. Grossly normal hearing. Mucous membranes are moist. Neck: Appears normal, supple, no cervical masses, normal ROM, no appreciable thyromegaly; no JVD Respiratory: Diminished to auscultation bilaterally with corase breath sounds; No appreciable no wheezing,  rales, rhonchi but had slight crackles. Normal respiratory effort and patient is not tachypenic. No accessory muscle use. Wearing 3 Liters of Supplemental O2 via [place] Cardiovascular: RRR, no murmurs / rubs / gallops. S1 and S2 auscultated. 1+ LE extremity edema.   Abdomen: Soft, non-tender, Distended 2/2 body habitus. No masses palpated. No appreciable hepatosplenomegaly. Bowel sounds positive x4.  GU: Deferred. Musculoskeletal: No clubbing / cyanosis of digits/nails. No joint deformity upper and lower extremities.  Skin: No rashes, lesions, ulcers on a limited skin evaluation. No induration; Warm and dry.  Neurologic: CN 2-12 grossly intact with no focal deficits. Romberg sign and cerebellar reflexes not assessed.  Psychiatric: Normal judgment and insight. Alert and oriented x 3. Normal mood and appropriate affect.   Data Reviewed: I have personally reviewed following labs and imaging studies  CBC: Recent Labs  Lab 11/04/18 1105 11/05/18 0928 11/06/18 0307 11/07/18 0209 11/08/18 0532  WBC 9.6 9.0 9.3 9.5 7.7  NEUTROABS 6.1  --   --   --   --   HGB 17.4* 17.0* 16.4* 16.9* 16.7*  HCT 57.0* 57.7* 55.4* 56.1* 55.2*  MCV 90.3 94.0 91.3 89.2 88.2  PLT 274 244 226 263 283   Basic Metabolic Panel: Recent Labs  Lab 11/07/18 0209 11/08/18 0532 11/09/18 0507 11/10/18 0505 11/11/18 0528  NA 134* 135 136 138 138  K 4.0 3.6 3.6 3.4* 3.6  CL 81* 86* 85* 87* 87*  CO2 40* 37* 38* 36* 36*  GLUCOSE 110* 115* 96 94 105*  BUN 30* 30* 31* 37* 35*  CREATININE 1.01* 0.94 0.79 0.82 0.76  CALCIUM 8.8* 9.1 9.0 9.2 9.1  MG 2.5* 2.4 2.6* 2.7* 2.5*   GFR: Estimated Creatinine Clearance: 68.2 mL/min (by C-G formula based on SCr of 0.76 mg/dL). Liver Function Tests: Recent Labs  Lab 11/04/18 1105  AST 40  ALT 52*  ALKPHOS 115  BILITOT 0.6  PROT 7.4  ALBUMIN 3.6   No results for input(s): LIPASE, AMYLASE in the last 168 hours. No results for input(s): AMMONIA in the last 168  hours. Coagulation Profile: No results for input(s): INR, PROTIME in the last 168 hours. Cardiac Enzymes: No results for input(s): CKTOTAL, CKMB, CKMBINDEX, TROPONINI in the last 168 hours. BNP (last 3 results) No results for input(s): PROBNP in the last 8760 hours. HbA1C: No results for input(s): HGBA1C in the last 72 hours. CBG: Recent Labs  Lab 11/10/18 0801 11/10/18 1200 11/10/18 1631 11/10/18 2134 11/11/18 0756  GLUCAP 118* 107* 155* 119* 95   Lipid Profile: No results for input(s): CHOL, HDL, LDLCALC, TRIG, CHOLHDL, LDLDIRECT in the last 72 hours. Thyroid Function Tests: No results for input(s): TSH, T4TOTAL, FREET4, T3FREE, THYROIDAB in the last 72 hours. Anemia Panel: No results for input(s): VITAMINB12, FOLATE, FERRITIN, TIBC, IRON, RETICCTPCT in the last 72 hours. Sepsis Labs: No results for input(s): PROCALCITON, LATICACIDVEN in the last 168 hours.  Recent Results (from the past 240 hour(s))  SARS Coronavirus 2 (  CEPHEID - Performed in [place] hospital lab), Hosp Order     Status: None   Collection Time: 11/04/18 11:02 AM   Specimen: Nasopharyngeal Swab  Result Value Ref Range Status   SARS Coronavirus 2 NEGATIVE NEGATIVE Final    Comment: (NOTE) If result is NEGATIVE SARS-CoV-2 target nucleic acids are NOT DETECTED. The SARS-CoV-2 RNA is generally detectable in upper and lower  respiratory specimens during the acute phase of infection. The lowest  concentration of SARS-CoV-2 viral copies this assay can detect is 250  copies / mL. A negative result does not preclude SARS-CoV-2 infection  and should not be used as the sole basis for treatment or other  patient management decisions.  A negative result may occur with  improper specimen collection / handling, submission of specimen other  than nasopharyngeal swab, presence of viral mutation(s) within the  areas targeted by this assay, and inadequate number of viral copies  (<250 copies / mL). A negative result  must be combined with clinical  observations, patient history, and epidemiological information. If result is POSITIVE SARS-CoV-2 target nucleic acids are DETECTED. The SARS-CoV-2 RNA is generally detectable in upper and lower  respiratory specimens dur ing the acute phase of infection.  Positive  results are indicative of active infection with SARS-CoV-2.  Clinical  correlation with patient history and other diagnostic information is  necessary to determine patient infection status.  Positive results do  not rule out bacterial infection or co-infection with other viruses. If result is PRESUMPTIVE POSTIVE SARS-CoV-2 nucleic acids MAY BE PRESENT.   A presumptive positive result was obtained on the submitted specimen  and confirmed on repeat testing.  While 2019 novel coronavirus  (SARS-CoV-2) nucleic acids may be present in the submitted sample  additional confirmatory testing may be necessary for epidemiological  and / or clinical management purposes  to differentiate between  SARS-CoV-2 and other Sarbecovirus currently known to infect humans.  If clinically indicated additional testing with an alternate test  methodology [phone]) is advised. The SARS-CoV-2 RNA is generally  detectable in upper and lower respiratory sp ecimens during the acute  phase of infection. The expected result is Negative. Fact Sheet for Patients:  StrictlyIdeas.no Fact Sheet for Healthcare Providers: BankingDealers.co.za This test is not yet approved or cleared by the [place] FDA and has been authorized for detection and/or diagnosis of SARS-CoV-2 by FDA under an Emergency Use Authorization (EUA).  This EUA will remain in effect (meaning this test can be used) for the duration of the COVID-19 declaration under Section 564(b)(1) of the Act, 21 U.S.C. section 360bbb-3(b)(1), unless the authorization is terminated or revoked sooner. Performed at [facility], [place] [address], [place], [place] [address]   Culture, Urine     Status: None   Collection Time: 11/05/18  5:56 AM   Specimen: Urine, Catheterized  Result Value Ref Range Status   Specimen Description   Final    URINE, CATHETERIZED Performed at [place] [address] [place], [place] [address]    Special Requests   Final    NONE Performed at [facility], [place] [address] [place], [place] [address]    Culture   Final    NO GROWTH Performed at [facility], [place] [address] [place], [place] [address]    Report Status 11/06/2018 FINAL  Final  MRSA PCR Screening     Status: None   Collection Time: 11/05/18  6:52 AM  Specimen: Nasal Mucosa; Nasopharyngeal  Result Value Ref Range Status   MRSA by PCR NEGATIVE NEGATIVE Final    Comment:        The GeneXpert MRSA Assay (FDA approved for NASAL specimens only), is one component of a comprehensive MRSA colonization surveillance program. It is not intended to diagnose MRSA infection nor to guide or monitor treatment for MRSA infections. Performed at [facility], [place] [address] [place], [place] [address]     Radiology Studies: No results found.  Scheduled Meds: . allopurinol  100 mg Oral Daily  . aspirin EC  81 mg Oral Daily  . chlorhexidine  15 mL Mouth Rinse BID  . enoxaparin (LOVENOX) injection  40 mg Subcutaneous Q24H  . furosemide  40 mg Intravenous Q8H  . insulin aspart  0-9 Units Subcutaneous TID WC  . ipratropium-albuterol  3 mL Nebulization BID  . mouth rinse  15 mL Mouth Rinse q12n4p  . metoprolol tartrate  12.5 mg Oral BID  . montelukast  10 mg Oral QHS  . nicotine  21 mg Transdermal Daily  . simvastatin  40 mg Oral QHS   Continuous Infusions:   LOS: 7 days   Kerney Elbe, DO [facility] PAGER is on [place]  If 7PM-7AM, please contact night-coverage [email or web address] Password [facility] 11/11/2018, 8:27 AM

## 2018-11-11 NOTE — Progress Notes (Signed)
Physical Therapy Treatment Patient Details Name: Rebecca Mcintyre MRN: [id number] DOB: Dec 27, 1945 Today's Date: 11/11/2018    History of Present Illness 73 yo female admitted with acute respiratory failure with hypoxia.    PT Comments    Pt is making steady progress with mobility and Independence. Pt continues to have decreased O2 sats with mobility. Reinforced pursed lip[ breathing and encouraged frequent walks on the unit with staff. O2 sats 87% on 3 liters after gait. Pt will continue to benefit from acute skilled PT until d/c home to facilitate improved endurance and independence.  Follow Up Recommendations  Home health PT     Equipment Recommendations  Other (comment)    Recommendations for Other Services       Precautions / Restrictions Precautions Precautions: Other (comment) Precaution Comments: o2 sats Restrictions Weight Bearing Restrictions: No    Mobility  Bed Mobility                  Transfers Overall transfer level: Modified independent Equipment used: Rolling walker (2 wheeled) Transfers: Sit to/from Stand Sit to Stand: Modified independent (Device/Increase time)            Ambulation/Gait Ambulation/Gait assistance: Supervision Gait Distance (Feet): 350 Feet Assistive device: Rolling walker (2 wheeled) Gait Pattern/deviations: Step-through pattern Gait velocity: decreased   General Gait Details: 02 sats 92 % pregait 2.liters, 87 % post gait 3 liters, o2 dropped to 88% on RA with 1 min of exercies.   Stairs             Wheelchair Mobility    Modified Rankin (Stroke Patients Only)       Balance Overall balance assessment: No apparent balance deficits (not formally assessed)                                          Cognition Arousal/Alertness: Awake/alert Behavior During Therapy: WFL for tasks assessed/performed Overall Cognitive Status: Within Functional Limits for tasks assessed                                        Exercises General Exercises - Lower Extremity Ankle Circles/Pumps: AROM;Strengthening;Both;Seated;15 reps Long Arc Quad: AROM;Strengthening;Both;15 reps;Seated Hip Flexion/Marching: AROM;Strengthening;Both;15 reps;Seated    General Comments        Pertinent Vitals/Pain Pain Assessment: No/denies pain    Home Living                      Prior Function            PT Goals (current goals can now be found in the care plan section) Progress towards PT goals: Progressing toward goals    Frequency    Min 3X/week      PT Plan Current plan remains appropriate    Co-evaluation              AM-PAC PT "6 Clicks" Mobility   Outcome Measure  Help needed turning from your back to your side while in a flat bed without using bedrails?: None Help needed moving from lying on your back to sitting on the side of a flat bed without using bedrails?: None Help needed moving to and from a bed to a chair (including a wheelchair)?: A Little Help needed standing up from a chair using your  arms (e.g., wheelchair or bedside chair)?: None Help needed to walk in hospital room?: A Little Help needed climbing 3-5 steps with a railing? : A Little 6 Click Score: 21    End of Session Equipment Utilized During Treatment: Gait belt;Oxygen Activity Tolerance: Patient tolerated treatment well Patient left: in chair;with call bell/phone within reach   PT Visit Diagnosis: Difficulty in walking, not elsewhere classified (R26.2)     Time: 9539-6728 PT Time Calculation (min) (ACUTE ONLY): 27 min  Charges:  $Gait Training: 8-22 mins $Therapeutic Exercise: 8-22 mins                     Theodoro Grist, PT   Lelon Mast 11/11/2018, 11:30 AM

## 2018-11-11 NOTE — TOC Progression Note (Addendum)
Transition of Care [facility]) - Progression Note    Patient Details  Name: Rebecca Mcintyre MRN: [id number] Date of Birth: 11-19-1945  Transition of [facility]) CM/SW [place], LCSW Phone Number: 11/11/2018, 10:30 AM  Clinical Narrative:  CSW following patient for support and discharge needs. Patient has chosen [facility] as her [place] agency. CSW spoke with Santiago Glad from [facility] and she stated she will let CSW know if [facility] is able to take patient. CSW will continue to follow    11:41 am  CSW received phone call from [facility] stating they will be able to follow patient at DC for [facility] needs    Expected Discharge Plan: [place] Barriers to Discharge: Continued Medical Work up  Expected Discharge Plan and Services Expected Discharge Plan: [place] In-house Referral: Clinical Social Work   Post Acute Care Choice: [place] arrangements for the past 2 months: Single Family Home Expected Discharge Date: (unknown)               DME Arranged: Oxygen, Walker rolling DME Agency: [employer] Date DME Agency Contacted: 11/09/18 Time DME Agency Contacted: [phone] Representative spoke with at DME Agency: [facility] Arranged: PT [place]: (pt wants more time to look for agency)         Social Determinants of Health (SDOH) Interventions    Readmission Risk Interventions No flowsheet data found.

## 2018-11-12 ENCOUNTER — Inpatient Hospital Stay (HOSPITAL_COMMUNITY): Payer: Medicare Other

## 2018-11-12 DIAGNOSIS — J9602 Acute respiratory failure with hypercapnia: Secondary | ICD-10-CM

## 2018-11-12 DIAGNOSIS — J9601 Acute respiratory failure with hypoxia: Secondary | ICD-10-CM

## 2018-11-12 DIAGNOSIS — I2609 Other pulmonary embolism with acute cor pulmonale: Secondary | ICD-10-CM

## 2018-11-12 LAB — COMPREHENSIVE METABOLIC PANEL
ALT: 34 U/L (ref 0–44)
AST: 31 U/L (ref 15–41)
Albumin: 3.6 g/dL (ref 3.5–5.0)
Alkaline Phosphatase: 112 U/L (ref 38–126)
Anion gap: 16 — ABNORMAL HIGH (ref 5–15)
BUN: 33 mg/dL — ABNORMAL HIGH (ref 8–23)
CO2: 36 mmol/L — ABNORMAL HIGH (ref 22–32)
Calcium: 9.4 mg/dL (ref 8.9–10.3)
Chloride: 84 mmol/L — ABNORMAL LOW (ref 98–111)
Creatinine, Ser: 0.85 mg/dL (ref 0.44–1.00)
GFR calc Af Amer: >60 mL/min (ref >60)
GFR calc non Af Amer: >60 mL/min (ref >60)
Glucose, Bld: 155 mg/dL — ABNORMAL HIGH (ref 70–99)
Potassium: 3.3 mmol/L — ABNORMAL LOW (ref 3.5–5.1)
Sodium: 136 mmol/L (ref 135–145)
Total Bilirubin: 0.8 mg/dL (ref 0.3–1.2)
Total Protein: 7.4 g/dL (ref 6.5–8.1)

## 2018-11-12 LAB — CBC WITH DIFFERENTIAL/PLATELET
Abs Immature Granulocytes: 0.03 10*3/uL (ref 0.00–0.07)
Basophils Absolute: 0.1 10*3/uL (ref 0.0–0.1)
Basophils Relative: 1 %
Eosinophils Absolute: 0.1 10*3/uL (ref 0.0–0.5)
Eosinophils Relative: 1 %
HCT: 56.5 % — ABNORMAL HIGH (ref 36.0–46.0)
Hemoglobin: 16.9 g/dL — ABNORMAL HIGH (ref 12.0–15.0)
Immature Granulocytes: 0 %
Lymphocytes Relative: 27 %
Lymphs Abs: 1.9 10*3/uL (ref 0.7–4.0)
MCH: 26.7 pg (ref 26.0–34.0)
MCHC: 29.9 g/dL — ABNORMAL LOW (ref 30.0–36.0)
MCV: 89.1 fL (ref 80.0–100.0)
Monocytes Absolute: 0.9 10*3/uL (ref 0.1–1.0)
Monocytes Relative: 13 %
Neutro Abs: 4.1 10*3/uL (ref 1.7–7.7)
Neutrophils Relative %: 58 %
Platelets: 226 10*3/uL (ref 150–400)
RBC: 6.34 MIL/uL — ABNORMAL HIGH (ref 3.87–5.11)
RDW: 14.7 % (ref 11.5–15.5)
WBC: 7.1 10*3/uL (ref 4.0–10.5)
nRBC: 0 % (ref 0.0–0.2)

## 2018-11-12 LAB — GLUCOSE, CAPILLARY
Glucose-Capillary: 120 mg/dL — ABNORMAL HIGH (ref 70–99)
Glucose-Capillary: 119 mg/dL — ABNORMAL HIGH (ref 70–99)

## 2018-11-12 LAB — PHOSPHORUS: Phosphorus: 4.1 mg/dL (ref 2.5–4.6)

## 2018-11-12 LAB — MAGNESIUM: Magnesium: 2.3 mg/dL (ref 1.7–2.4)

## 2018-11-12 MED ORDER — GUAIFENESIN ER 600 MG PO TB12: 600.0000 mg | ORAL_TABLET | Freq: Two times a day (BID) | ORAL | 0 refills | Status: AC

## 2018-11-12 MED ORDER — FUROSEMIDE 40 MG PO TABS: 40.0000 mg | ORAL_TABLET | Freq: Two times a day (BID) | ORAL | Status: DC

## 2018-11-12 MED ORDER — NICOTINE 21 MG/24HR TD PT24: 21.0000 mg | MEDICATED_PATCH | Freq: Every day | TRANSDERMAL | 0 refills | Status: DC

## 2018-11-12 MED ORDER — ONDANSETRON HCL 4 MG PO TABS: 4.0000 mg | ORAL_TABLET | Freq: Four times a day (QID) | ORAL | 0 refills | Status: DC | PRN

## 2018-11-12 MED ORDER — POTASSIUM CHLORIDE CRYS ER 20 MEQ PO TBCR
40.0000 meq | EXTENDED_RELEASE_TABLET | Freq: Two times a day (BID) | ORAL | Status: DC
  Administered 2018-11-12: 40 meq via ORAL
  Filled 2018-11-12: qty 2

## 2018-11-12 MED ORDER — FUROSEMIDE 40 MG PO TABS: 40.0000 mg | ORAL_TABLET | Freq: Every day | ORAL | 0 refills | Status: DC

## 2018-11-12 MED ORDER — POTASSIUM CHLORIDE CRYS ER 20 MEQ PO TBCR: 20.0000 meq | EXTENDED_RELEASE_TABLET | Freq: Every day | ORAL | 0 refills | Status: DC

## 2018-11-12 NOTE — Progress Notes (Signed)
Patient 88% O2 on room air, 89-90% on 1.5 liters nasal cannula. Currently 94% on 2.5 liters nasal cannula.

## 2018-11-12 NOTE — Progress Notes (Signed)
SATURATION QUALIFICATIONS: (This note is used to comply with regulatory documentation for home oxygen)  Patient Saturations on Room Air at Rest = 87%  Patient Saturations on 3 Liters of oxygen at rest = 92%

## 2018-11-12 NOTE — Consult Note (Signed)
NAME:  Rebecca Mcintyre, MRN:  [id number], DOB:  11-24-1945, LOS: 8 ADMISSION DATE:  11/04/2018, CONSULTATION DATE:  6/18 REFERRING MD:  Alfredia Ferguson, CHIEF COMPLAINT:  Hypoxia    Brief History   73 year old smoker. Admitted w/ acute on chronic hypercarbic and hypoxic resp failure 2/2 decompensated cor pulmonale and diastolic HF. PCCM asked to assist w/ discharge  Recommendations and out pt follow up w/ concerns about probable COPD and sleep apnea being contributing factors to her chronic respiratory failure.   History of present illness   This is a pleasant 73 year old female who was admitted on 6/10 after a syncopal episode w/ about 1-2 week prodrome of acute on chronic worsening shortness of breath. Reports first noting exertional dyspnea dating back to as far as a year ago. She works as a Dentist and would note she would get SOB waking up a flight of stairs or even get short of breath bringing her garbage can to the curb. She was actually to see [place] in consult back around feb but due to the [place] -19 pandemic her consult was canceled. She reports chronic nasal congestion, but no sig cough or cold symptoms. Does endorse wheezing w/ exertion. Re: the shortness of breath she noted it to be much worse in magnitude about one week prior to admission and around that time she was also noticing worsening swelling of her ankles, legs and even up into her pelvis. The swelling actually started about a week prior. On the day of admit she had an episode shortly after getting OOB to go to [place] where she had loss of LOC and this resulted in a fall. She woke up having hit her head. She called her PCP who instructed her to go to ER. In the ED she was found to have pulse ox on room air in 80s. CXR and CT chest were obtained. CT chest was neg for PE, but did show changes c/w emphysema and what was likely element of pulmonary edema. Additional diagnostics on admit included: hgb 16.9, BNP 586.9, neg trop I, abg: 7.27/pco2: 85,  po2 175, bicarv 38.5 and sats 99% (on oxygen).  She was admitted w/ working dx of acute on chronic hypoxic and hypercarbic respiratory failure felt 2/2 pulmonary edema and volume overload. She was treated w/ supplemental oxygen and diuresis. ECHO cardiogram was obtained. This raised concern for both diastolic dysfxn but also noted both RA and LA dilation, reduced RV fxn and severe PH.  Over the course of her hospitalization her edema has since subsided and her shortness of breath improved. She is now down to 1.5 liters and almost 10 liters negative. PCCM asked to see to make discharge recommendations as well as to help w/ out pt follow up   Past Medical History  CAD w/ stents (remote), Smoker 1 ppd (last 9 years; prior to that had stopped for about 20), obesity, HTN, gout, DM  Significant Hospital Events   6/10 admitted  Consults:  pulm 6/18  Procedures:    Significant Diagnostic Tests:  CT chest 6/10: 1. No pulmonary emboli identified. Evaluation of distal pulmonary arterial branches is limited due to respiratory motion. 2. Coronary artery calcifications. 3. Cardiomegaly. 4. Emphysematous changes in the lungs. 5. Atherosclerotic change in the thoracic aorta without aneurysm. ECHO 6/10:  1. The left ventricle has normal systolic function with an ejection fraction of 60-65%. The cavity size was normal. Left ventricular diastolic Doppler parameters are consistent with impaired relaxation. 2. The  right ventricle has moderately reduced systolic function. The cavity was moderately enlarged. There is no increase in right ventricular wall thickness. Right ventricular systolic pressure is severely elevated with an estimated pressure of 70.3  mmHg.3. There is flattening of the interventricular septum during diastole c/w RV volume overload. 4. Left atrial size was mildly dilated. 5. Right atrial size was moderately dilated. 6. Tricuspid valve regurgitation is mild-moderate. 7. Pulmonary hypertension is  severe. 8. The inferior vena cava was dilated in size with <50% respiratory variability. 9. The interatrial septum was not assessed. Micro Data:    Antimicrobials:    Interim history/subjective:  Feels better ready to go home   Objective   Blood pressure (Abnormal) 97/57, pulse 81, temperature 97.9 F (36.6 C), temperature source Oral, resp. rate 18, height _0  (1.6 m), weight 92 kg, SpO2 93 %.        Intake/Output Summary (Last 24 hours) at 11/12/2018 0956 Last data filed at 11/12/2018 0507 Gross per 24 hour  Intake 1200 ml  Output 1350 ml  Net -150 ml   Filed Weights   11/10/18 0450 11/10/18 2102 11/12/18 0500  Weight: 91.1 kg 91.5 kg 92 kg    Examination: General: 73 year old white female. Resting in bed. No acute distress  HENT: NCAT no JVD MMM.  Lungs: clear decreased bases, no accessory use  Cardiovascular: RRR w/out MRG Abdomen: soft not tender + bowel sounds  Extremities: warm and dry  Neuro: intact  GU: voids   Resolved Hospital Problem list   Acute on chronic hypoxic and hypercarbic respiratory failure  Acute diastolic hf  Syncope   Assessment & Plan:  Pulm problem list: Chronic hypoxic and hypercarbic respiratory failure in setting of what is likely COPD and undiagnosed OSA Severe PH secondary to chronic hypoxia Cor pulmonale  Diastolic HF Tobacco abuse  Obesity   Non-pul problem list:  CAD  DM type II Gout  Discussion  Suspect she has been more chronically hypoxic for some time now. Her echo raising concern about PH and also associated RV dysfxn and RA dilation in addition to an hgb of 16.9 on admit support this, as would the slow and gradual decline. She has improved w/ IV lasix and oxygen. I do think she likely has both COPD and OSA. Would be ideal to get objective studies to quantify this.   Plan/rec Ok for discharge from our stand-point Do walking oximetry and determine oxygen needs to keep > 88% at all times Will set her up w/ out pt  follow-up where we can also set up PFTs and PSG.  Will see an NP as hospital f/u, arrange above studies and then see Dr Scheryl Marten ACNP-BC [place] Pager # [phone] OR # [phone] if no answer    Labs   CBC: Recent Labs  Lab 11/06/18 0307 11/07/18 0209 11/08/18 0532 11/11/18 0528  WBC 9.3 9.5 7.7 8.6  NEUTROABS  --   --   --  4.7  HGB 16.4* 16.9* 16.7* 16.4*  HCT 55.4* 56.1* 55.2* 55.2*  MCV 91.3 89.2 88.2 89.9  PLT 226 263 237 993    Basic Metabolic Panel: Recent Labs  Lab 11/07/18 0209 11/08/18 0532 11/09/18 0507 11/10/18 0505 11/11/18 0528  NA 134* 135 136 138 138  K 4.0 3.6 3.6 3.4* 3.6  CL 81* 86* 85* 87* 87*  CO2 40* 37* 38* 36* 36*  GLUCOSE 110* 115* 96 94 105*  BUN 30* 30* 31*  37* 35*  CREATININE 1.01* 0.94 0.79 0.82 0.76  CALCIUM 8.8* 9.1 9.0 9.2 9.1  MG 2.5* 2.4 2.6* 2.7* 2.5*  PHOS  --   --   --   --  4.1   GFR: Estimated Creatinine Clearance: 68.4 mL/min (by C-G formula based on SCr of 0.76 mg/dL). Recent Labs  Lab 11/06/18 0307 11/07/18 0209 11/08/18 0532 11/11/18 0528  WBC 9.3 9.5 7.7 8.6    Liver Function Tests: Recent Labs  Lab 11/11/18 0528  AST 33  ALT 37  ALKPHOS 98  BILITOT 0.8  PROT 7.0  ALBUMIN 3.4*   No results for input(s): LIPASE, AMYLASE in the last 168 hours. No results for input(s): AMMONIA in the last 168 hours.  ABG    Component Value Date/Time   PHART 7.407 11/05/2018 1740   PCO2ART 66.6 (HH) 11/05/2018 1740   PO2ART 59.4 (L) 11/05/2018 1740   HCO3 41.2 (H) 11/05/2018 1740   O2SAT 90.7 11/05/2018 1740     Coagulation Profile: No results for input(s): INR, PROTIME in the last 168 hours.  Cardiac Enzymes: No results for input(s): CKTOTAL, CKMB, CKMBINDEX, TROPONINI in the last 168 hours.  HbA1C: Hgb A1c MFr Bld  Date/Time Value Ref Range Status  11/03/2018 08:56 AM 7.0 (H) 4.6 - 6.5 % Final    Comment:    Glycemic Control Guidelines for People with Diabetes:Non  Diabetic:  <6%Goal of Therapy: <7%Additional Action Suggested:  >8%   03/10/2018 08:10 AM 6.1 4.6 - 6.5 % Final    Comment:    Glycemic Control Guidelines for People with Diabetes:Non Diabetic:  <6%Goal of Therapy: <7%Additional Action Suggested:  >8%     CBG: Recent Labs  Lab 11/11/18 0756 11/11/18 1119 11/11/18 1629 11/11/18 2037 11/12/18 0852  GLUCAP 95 124* 106* 132* 120*    Review of Systems:   Review of Systems  Constitutional: Positive for malaise/fatigue. Negative for chills, diaphoresis, fever and weight loss.  HENT: Positive for congestion. Negative for sinus pain and sore throat.   Eyes: Negative.   Respiratory: Positive for shortness of breath and wheezing. Negative for cough, hemoptysis and stridor.   Cardiovascular: Positive for orthopnea and leg swelling. Negative for chest pain.  Gastrointestinal: Negative.   Genitourinary: Negative.   Musculoskeletal: Negative.   Skin: Negative.   Neurological: Positive for dizziness and loss of consciousness.  Endo/Heme/Allergies: Positive for environmental allergies.  Psychiatric/Behavioral: Negative.      Past Medical History  She,  has a past medical history of Allergy, ASCUS on Pap smear, Gout, Hyperlipidemia, Hypertension, Myocardial infarction ([place]), Osteopenia, and Solitary kidney.   Surgical History    Past Surgical History:  Procedure Laterality Date  . [place]  2011  . LAD Stent angioplasty  2002  . Right oophorectomy with salpingectomy     benign growth Dr. Joneen Caraway     Social History   reports that she has quit smoking. Her smoking use included cigarettes. She has never used smokeless tobacco. She reports current alcohol use. She reports that she does not use drugs.   Family History   Her family history includes Breast cancer (age of onset: 81) in her mother; Colon cancer in her brother; Heart attack in her brother; Heart disease (age of onset: 9) in her daughter; Lung cancer in her father.  There is no history of Esophageal cancer, Pancreatic cancer, Rectal cancer, or Stomach cancer.   Allergies Allergies  Allergen Reactions  . Atorvastatin     REACTION: myalgias  and liver  Home Medications  Prior to Admission medications   Medication Sig Start Date End Date Taking? Authorizing Provider  acetaminophen (TYLENOL) 500 MG tablet Take 1,000 mg by mouth every 6 (six) hours as needed for mild pain.   Yes [provider]  albuterol (VENTOLIN HFA) 108 (90 Base) MCG/ACT inhaler Inhale 2 puffs into the lungs every 6 (six) hours as needed for wheezing or shortness of breath. 09/22/18 09/22/19 Yes Panosh, Standley Brooking, MD  allopurinol (ZYLOPRIM) 100 MG tablet TAKE 1 TABLET BY MOUTH EVERY DAY 09/22/18  Yes Panosh, Standley Brooking, MD  aspirin 81 MG tablet Take 81 mg by mouth daily.   Yes [provider]  metFORMIN (GLUCOPHAGE) 500 MG tablet TAKE 1 TABLET (500 MG TOTAL) BY MOUTH 2 (TWO) TIMES DAILY WITH A MEAL. 09/30/18  Yes Panosh, Standley Brooking, MD  metoprolol tartrate (LOPRESSOR) 25 MG tablet TAKE 1/2 TABLETS 2 TIMES DAILY BY MOUTH. Patient taking differently: Take 12.5 mg by mouth 2 (two) times daily.  06/30/18  Yes Panosh, Standley Brooking, MD  montelukast (SINGULAIR) 10 MG tablet TAKE 1 TABLET (10 MG TOTAL) AT BEDTIME BY MOUTH. 09/22/18  Yes Panosh, Standley Brooking, MD  nitroGLYCERIN (NITROSTAT) 0.4 MG SL tablet Place 1 tablet (0.4 mg total) under the tongue every 5 (five) minutes as needed. 04/18/14  Yes Panosh, Standley Brooking, MD  simvastatin (ZOCOR) 40 MG tablet TAKE 1 TABLET AT BEDTIME BY MOUTH. Patient taking differently: Take 40 mg by mouth at bedtime.  06/30/18  Yes Panosh, Standley Brooking, MD  allopurinol (ZYLOPRIM) 100 MG tablet Take 1 tablet (100 mg total) by mouth daily. Patient not taking: Reported on 11/04/2018 01/12/18   Rosemarie Ax, MD  amoxicillin-clavulanate (AUGMENTIN) 875-125 MG tablet Take 1 tablet by mouth every 12 (twelve) hours. For sinusiits Patient not taking: Reported on 11/04/2018 08/28/18    Panosh, Standley Brooking, MD  diclofenac sodium (VOLTAREN) 1 % GEL Apply 2 g topically 4 (four) times daily. Rub into affected area of foot 2 to 4 times daily Patient not taking: Reported on 11/04/2018 04/06/18   Trula Slade, DPM  promethazine (PHENERGAN) 25 MG tablet Take 1 tablet (25 mg total) by mouth every 8 (eight) hours as needed for nausea or vomiting. Patient not taking: Reported on 11/04/2018 03/02/18   Trula Slade, DPM  traMADol (ULTRAM) 50 MG tablet Take 1 tablet (50 mg total) by mouth every 8 (eight) hours as needed. Patient not taking: Reported on 11/04/2018 12/30/17   Panosh, Standley Brooking, MD     Critical care time: [place] ACNP-BC [place] Pager # [phone] OR # [phone] if no answer

## 2018-11-12 NOTE — Discharge Summary (Signed)
Physician Discharge Summary  Rebecca Mcintyre CNO:[id number] DOB: 10/12/1945 DOA: 11/04/2018  PCP: Rebecca Medin, MD  Admit date: 11/04/2018 Discharge date: 11/12/2018  Admitted From: Home Disposition: Home with [place]  Recommendations for Outpatient Follow-up:  1. Follow up with PCP in 1-2 weeks 2. Follow up with Cardiology Dr. Percival Mcintyre in 1-2 weeks 3. Follow up with Pulmonary as an outpatient; Appointment scheduled for 2 weeks and will follow up with Dr. Elsworth Mcintyre after PFTs and Sleep Study 4. Please obtain CMP/CBC, Mag, Phos in one week 5. Please follow up on the following pending results:  Home Health: Yes Equipment/Devices: Oxygen 3 Liters; Rolling Walker   Discharge Condition: Stable CODE STATUS: FULL CODE Diet recommendation: Heart Healthy carb modified diet with 1500 mL Fluid Restriction  Brief/Interim Summary: HPI per Dr. Domenic Mcintyre on 11/04/2018  Rebecca Mcintyre a 73 y.o.femalewith medical history significant ofCAD, remote stent to LAD in 2002, tobacco abuse, dyslipidemia, obesity presented to the ED with the above complaints. -Patient reports did progressive Lee worsening dyspnea on exertion for the last 2 weeks, associated with swelling of her feet and ankles, abdominal tightness etc. -Early this morning around 5 AM she remembers waking up to go to the bathroom, next thing she knows she is on the floor, woke up hitting her head. -She denies any chest pain palpitations, no fevers or chills, no cough congestion etc. ED Course:Patient was noted to be hypoxic with sats in the mid 80s which improved with few liters of O2, chest x-ray showed COPD changes, CT angiogram was negative for pulmonary embolism  **Interim History Patient was admitted for the treatment of acute hypoxic respiratory failure likely secondary CHF exacerbation but she likely had chronic respiratory failure.  Patient is diuresed almost 10 L and respiratory status improved.  She remained on oxygen  at 3 L and will be discharged home with a.  Pulmonary was consulted for further evaluation recommendations and recommended changing to p.o. diuretics and discharging home with close follow-up for outpatient PFTs and sleep study.  Patient was de-medically stable for discharge at time and will need to follow-up with PCP, cardiology, pulmonology in outpatient setting  Discharge Diagnoses:  Principal Problem:   Acute respiratory failure with hypoxia and hypercapnia (HCC) Active Problems:   GOUT   OBESITY   Essential hypertension   TOBACCO ABUSE, HX OF   Pre-diabetes   Acute CHF (congestive heart failure) (HCC)   CHF (congestive heart failure) (HCC)   Type 2 diabetes mellitus without complication (HCC)   Acute on chronic diastolic CHF (congestive heart failure) (HCC)  Acute on Chronic hypoxic respiratory failure with hypercapnia Acute on chronic diastolic congestive heart failure and Acute Cor Pulmonale Pulmonary hypertension Likely underlying obstructive sleep apnea -Presenting from home with a 2-week history of progressive dyspnea, lower extremity edema and abdominal fullness/tightness.  -BNP on admission elevated at 586.9.  -COVID-19 test negative. D-dimer elevated 0.81.  -Chest x-ray with emphysematous changes consistent with COPD.  -CT angiogram PE study negative for PE but noted cardiomegaly. -Transthoracic echocardiogram with EF 29-47%, diastolic dysfunction, RV reduced systolic function, RV systolic pressure elevated consistent with volume overload, IVC dilated. ABG, pH 7.27, PaCO2 85, PaO2 175, HCO3 38.5.  -Slowly improving, continues to require 2.5-3 LNC (down from 4L) at rest with significant de-saturation on ambulation -Patient is -10.240 Liters since Admission  -Continued IVFurosemide 40 mg IV q8h and transitioned to p.o. 40 mg daily -Continue to titrate supplemental oxygen to maintain SPO2 greater than 90% -Daily  weights and strict I's and O's -Low-salt diet, consistent  carbohydrate with 1500 mL Fluid Restriction -Likely would benefit from outpatient sleep study and referral to outpatient pulmonologyfollowing discharge but will consult in-house Pulmonary for further evaluation and Assistance; pulmonary evaluated and recommending discharge and follow-up; recommending discharging on an albuterol inhaler and they will place the patient on oral Ellipta as an outpatient -C/w Albuterol Nebs 3 mL Neb q6hprn Wheezing and SOB while hospitalized; C/w DuoNeb 3 mL BID while hospitalized and will discharge on home albuterol -Will add Flutter Valve and Incentive Spirometry along with Pulmonary Toilet  -SARS-CoV-2 Testing Negative  -Repeat CXR this AM showed Mild increased opacity in the right base may represent atelectasis versus developing infection/pneumonia. No other interval changes. -Will add Guaifenesin 1200 mg po BID and will continue 600 mg p.o. twice daily at discharge -We will need to follow-up with pulmonary in outpatient setting within 1 to 2 weeks an appointment is already scheduled; pulmonary there will reassess echo once acute issues have resolved and consider right heart catheter if issues persist -Patient also need to follow-up with cardiology in outpatient setting  Confusion and Syncopal episode, improved likely in the setting of hypoxia -Patient reported syncopal episode while going to the bathroom prior to presentation.  -Patient has been more confused per daughter.  -Etiology likely from hypoxia which she is experiencing over the past 2 weeks with etiology likely underlying acute on chronic congestive heart failure.  -CT head without contrast negative for acute intracranial pathology, did note left posterior scalp swelling without underlying fracture.  -ABG on admission noted with elevated PaCO2 of 85. Echo with normal EF.  -CTPA negative for pulmonary embolism.  -TSH within normal limits at 1.266. -Mentation now back at baseline. Likely 2/2  hypoxia and elevated CO2 -Continue diuresis, supplemental oxygen at discharge -C/w Supportive care  Type 2 Diabetes Mellitus -Hemoglobin A1c 7.0 on 11/03/2018, well controlled. Home regimen includes metformin 500 mg p.o. twice daily.  And will resume at discharge -Hold oral hypoglycemics while inpatient -Consistent carbohydrate diet -CBGs ranging from 106-130 -C/w Sensitive Novolog Sliding Insulin Sliding AC scale for coverage while hospitalized  Suspect underlying COPD and Chronic Interstitial Lung Disease  Hx tobacco abuse disorder Suspected sleep apnea -History of significant tobacco abuse.  -Chest x-ray findings suggestive of emphysematous changes consistent with underlying COPD. No PFTs available for review in EMR.  -Patient on montelukast 10 mg p.o. daily and albuterol inhaler as needed at home. -Continue nebs scheduled and as as needed  -Counseled on tobacco cessation and will continue Nicotine Patch  -Likely benefit from LAMA/LABA on discharge but per pulmonary she will need to follow-up with them first and likely they will start an oral Ellipta at the outpatient follow-up visit -Pulmonary consulted in-house for further evaluation recommendations -Repeat CXR as above -Patient will need to follow-up with pulmonary within 2 weeks  Obesity -Estimated body mass index is 35.93 kg/m as calculated from the following:   Height as of this encounter: _0  (1.6 m).   Weight as of this encounter: 92 kg. -Weight loss and Dietary Counseling given  -Patient will need aggressive weight loss measures in the future as this complicates all facets of care  Hx CAD -Remote LAD stent in 2002. -Continue Aspirin 81 mg po Daily, Metoprolol 12.5 mg po BID and Simvastatin 40 mg po qHS  Hypokalemia -Improved. K+ is 3.3 -Replete prior to discharge -Continue to Monitor and Replete as Necessary; will send out on p.o. potassium chloride daily along  with her Lasix -Repeat CMP in AM  .  Generalized Weakness/ Physical Debility: -PT following, currently recommending HHPT and 4 wheeled walker -Continue mobilization efforts with attempts at weaning oxygen daily  Erythrocytosis -In the setting of Tobacco Abuse and Diuresis  -Patient's Hb/Hct is now 16.9/56.5 -Continue to Monitor and Trend -Repeat CBC in AM   Discharge Instructions  Discharge Instructions    (HEART FAILURE PATIENTS) Call MD:  Anytime you have any of the following symptoms: 1) 3 pound weight gain in 24 hours or 5 pounds in 1 week 2) shortness of breath, with or without a dry hacking cough 3) swelling in the hands, feet or stomach 4) if you have to sleep on extra pillows at night in order to breathe.   Complete by: As directed    Call MD for:  difficulty breathing, headache or visual disturbances   Complete by: As directed    Call MD for:  extreme fatigue   Complete by: As directed    Call MD for:  hives   Complete by: As directed    Call MD for:  persistant dizziness or light-headedness   Complete by: As directed    Call MD for:  persistant nausea and vomiting   Complete by: As directed    Call MD for:  redness, tenderness, or signs of infection (pain, swelling, redness, odor or green/yellow discharge around incision site)   Complete by: As directed    Call MD for:  severe uncontrolled pain   Complete by: As directed    Call MD for:  temperature >100.4   Complete by: As directed    Diet - low sodium heart healthy   Complete by: As directed    1500 mL Fluid Restriction   Discharge instructions   Complete by: As directed    You were cared for by a hospitalist during your hospital stay. If you have any questions about your discharge medications or the care you received while you were in the hospital after you are discharged, you can call the unit and ask to speak with the hospitalist on call if the hospitalist that took care of you is not available. Once you are discharged, your primary care  physician will handle any further medical issues. Please note that NO REFILLS for any discharge medications will be authorized once you are discharged, as it is imperative that you return to your primary care physician (or establish a relationship with a primary care physician if you do not have one) for your aftercare needs so that they can reassess your need for medications and monitor your lab values.  Follow up with PCP, Pulmonary, and Cardiology. Take all medications as prescribed. If symptoms change or worsen please return to the ED for evaluation   For home use only DME 4 wheeled rolling walker with seat   Complete by: As directed    Patient needs a walker to treat with the following condition:  Generalized weakness Acute respiratory failure with hypoxia (HCC)     Increase activity slowly   Complete by: As directed      Allergies as of 11/12/2018      Reactions   Atorvastatin    REACTION: myalgias  and liver      Medication List    STOP taking these medications   amoxicillin-clavulanate 875-125 MG tablet Commonly known as: Augmentin   promethazine 25 MG tablet Commonly known as: PHENERGAN   traMADol 50 MG tablet Commonly known as: Veatrice Bourbon  TAKE these medications   acetaminophen 500 MG tablet Commonly known as: TYLENOL Take 1,000 mg by mouth every 6 (six) hours as needed for mild pain.   albuterol 108 (90 Base) MCG/ACT inhaler Commonly known as: VENTOLIN HFA Inhale 2 puffs into the lungs every 6 (six) hours as needed for wheezing or shortness of breath.   allopurinol 100 MG tablet Commonly known as: ZYLOPRIM TAKE 1 TABLET BY MOUTH EVERY DAY What changed: Another medication with the same name was removed. Continue taking this medication, and follow the directions you see here.   aspirin 81 MG tablet Take 81 mg by mouth daily.   diclofenac sodium 1 % Gel Commonly known as: VOLTAREN Apply 2 g topically 4 (four) times daily. Rub into affected area of foot 2 to 4  times daily   furosemide 40 MG tablet Commonly known as: LASIX Take 1 tablet (40 mg total) by mouth daily.   guaiFENesin 600 MG 12 hr tablet Commonly known as: MUCINEX Take 1 tablet (600 mg total) by mouth 2 (two) times daily for 5 days.   metFORMIN 500 MG tablet Commonly known as: GLUCOPHAGE TAKE 1 TABLET (500 MG TOTAL) BY MOUTH 2 (TWO) TIMES DAILY WITH A MEAL.   metoprolol tartrate 25 MG tablet Commonly known as: LOPRESSOR TAKE 1/2 TABLETS 2 TIMES DAILY BY MOUTH. What changed: See the new instructions.   montelukast 10 MG tablet Commonly known as: SINGULAIR TAKE 1 TABLET (10 MG TOTAL) AT BEDTIME BY MOUTH.   nicotine 21 mg/24hr patch Commonly known as: NICODERM CQ - dosed in mg/24 hours Place 1 patch (21 mg total) onto the skin daily. Start taking on: November 13, 2018   nitroGLYCERIN 0.4 MG SL tablet Commonly known as: NITROSTAT Place 1 tablet (0.4 mg total) under the tongue every 5 (five) minutes as needed.   ondansetron 4 MG tablet Commonly known as: ZOFRAN Take 1 tablet (4 mg total) by mouth every 6 (six) hours as needed for nausea.   potassium chloride SA 20 MEQ tablet Commonly known as: K-DUR Take 1 tablet (20 mEq total) by mouth daily for 30 days.   simvastatin 40 MG tablet Commonly known as: ZOCOR TAKE 1 TABLET AT BEDTIME BY MOUTH. What changed: See the new instructions.            Durable Medical Equipment  (From admission, onward)         Start     Ordered   11/12/18 1104  DME Oxygen  Once    Question Answer Comment  Length of Need 12 Months   Mode or (Route) Nasal cannula   Liters per Minute 3   Frequency Continuous (stationary and portable oxygen unit needed)   Oxygen delivery system Gas      11/12/18 1110   11/12/18 1016  For home use only DME oxygen  Once    Question Answer Comment  Length of Need 12 Months   Mode or (Route) Nasal cannula   Liters per Minute 3   Frequency Continuous (stationary and portable oxygen unit needed)   Oxygen  delivery system Gas      11/12/18 1015   11/12/18 0000  For home use only DME 4 wheeled rolling walker with seat    Question Answer Comment  Patient needs a walker to treat with the following condition Generalized weakness   Patient needs a walker to treat with the following condition Acute respiratory failure with hypoxia ([place])      11/12/18 1110  Follow-up Information    [name], NP Follow up on 11/26/2018.   Specialty: Pulmonary Disease Why: 930am Contact information: [address] [place] [address] [phone]          Allergies  Allergen Reactions  . Atorvastatin     REACTION: myalgias  and liver   Consultations:  Pulmonary  Procedures/Studies: Dg Chest 2 View  Result Date: 11/04/2018 CLINICAL DATA:  Shortness of breath. Fall. EXAM: CHEST - 2 VIEW COMPARISON:  Chest radiographs 03/11/2017 and CT 03/17/2017 FINDINGS: The cardiac silhouette is upper limits of normal in size. Aortic atherosclerosis is noted. There is chronic peribronchial thickening with upper lobe predominant emphysema as shown on CT. No acute airspace consolidation, overt pulmonary edema, pleural effusion, pneumothorax is identified. No acute osseous abnormality is seen. IMPRESSION: COPD without evidence of acute cardiopulmonary process. Electronically Signed   By: Logan Bores M.D.   On: 11/04/2018 11:49   Ct Head Wo Contrast  Result Date: 11/04/2018 CLINICAL DATA:  Pain status post fall. EXAM: CT HEAD WITHOUT CONTRAST TECHNIQUE: Contiguous axial images were obtained from the base of the skull through the vertex without intravenous contrast. COMPARISON:  None. FINDINGS: Brain: No evidence of acute infarction, hemorrhage, hydrocephalus, extra-axial collection or mass lesion/mass effect. The cerebellar tonsils are somewhat low riding, however this is not well visualized on this exam. Vascular: No hyperdense vessel or unexpected calcification. Skull: Normal. Negative for  fracture or focal lesion. Posterior scalp swelling is noted. Sinuses/Orbits: No acute finding. Other: None. IMPRESSION: 1. No acute intracranial abnormality detected. 2. Left posterior scalp swelling without evidence of an underlying fracture. Electronically Signed   By: Constance Holster M.D.   On: 11/04/2018 23:28   Ct Angio Chest Pe W/cm &/or Wo Cm  Result Date: 11/04/2018 CLINICAL DATA:  Decreased O2 saturations. Shortness of breath for 3 weeks. Bilateral foot and ankle swelling for a month. EXAM: CT ANGIOGRAPHY CHEST WITH CONTRAST TECHNIQUE: Multidetector CT imaging of the chest was performed using the standard protocol during bolus administration of intravenous contrast. Multiplanar CT image reconstructions and MIPs were obtained to evaluate the vascular anatomy. CONTRAST:  153m OMNIPAQUE IOHEXOL 350 MG/ML SOLN COMPARISON:  Chest x-ray November 04, 2018 and chest CT March 17, 2017 FINDINGS: Cardiovascular: Cardiomegaly is identified. There are coronary artery calcifications in the right and left coronary arteries. Atherosclerotic changes seen in the nonaneurysmal aorta without dissection. Evaluation of the pulmonary arteries is limited due to respiratory motion. No pulmonary emboli identified. Mediastinum/Nodes: There is debris within the esophagus. The thyroid is unremarkable. The chest wall is unremarkable. No adenopathy. No effusions. Lungs/Pleura: Respiratory motion limits evaluation. Scattered atelectasis. Emphysematous changes, particularly in the upper lobes. There is bronchial wall thickening in the bases. Central airways are normal. No suspicious nodules, masses, or focal infiltrates. Upper Abdomen: No acute abnormality. Musculoskeletal: No chest wall abnormality. No acute or significant osseous findings. Review of the MIP images confirms the above findings. IMPRESSION: 1. No pulmonary emboli identified. Evaluation of distal pulmonary arterial branches is limited due to respiratory motion. 2.  Coronary artery calcifications. 3. Cardiomegaly. 4. Emphysematous changes in the lungs. 5. Atherosclerotic change in the thoracic aorta without aneurysm. Aortic Atherosclerosis (ICD10-I70.0) and Emphysema (ICD10-J43.9). Electronically Signed   By: DDorise BullionIII M.D   On: 11/04/2018 12:46   Dg Chest Port 1 View  Result Date: 11/11/2018 CLINICAL DATA:  Shortness of breath. EXAM: PORTABLE CHEST 1 VIEW COMPARISON:  11/08/2018, 03/11/2017.  CT 11/04/2018. FINDINGS: Mediastinum and hilar  structures are normal. Heart size normal. Mild bilateral interstitial prominence again noted consistent with chronic interstitial lung disease. Mild left mid lung field linear density, most likely subsegmental atelectasis. Follow-up exam suggested to demonstrate resolution. No pleural effusion or pneumothorax. No acute bony abnormality. IMPRESSION: 1. Mild left mid lung field linear density most likely subsegmental atelectasis. Follow-up exam to demonstrate resolution suggested. 2.  Chronic interstitial lung disease Electronically Signed   By: Marcello Moores  Register   On: 11/11/2018 12:10   Dg Chest Port 1 View  Result Date: 11/08/2018 CLINICAL DATA:  Shortness of breath. Congestive heart failure. EXAM: PORTABLE CHEST 1 VIEW COMPARISON:  11/06/2018 FINDINGS: Stable mild cardiomegaly. Aortic atherosclerosis. Pulmonary hyperinflation again seen, consistent with COPD. No evidence of pulmonary infiltrate or edema. No evidence of pleural effusion. IMPRESSION: Stable mild cardiomegaly and COPD. No active lung disease. Electronically Signed   By: Earle Gell M.D.   On: 11/08/2018 07:37   Dg Chest Port 1 View  Result Date: 11/06/2018 CLINICAL DATA:  Shortness of breath EXAM: PORTABLE CHEST 1 VIEW COMPARISON:  11/05/2018 FINDINGS: Cardiac shadow is stable. Aortic calcifications are again noted. Mild interstitial changes are noted but slightly improved when compare with the prior exam consistent with improving CHF. No focal infiltrate  is seen. No effusion is noted. IMPRESSION: Improving CHF. Electronically Signed   By: Inez Catalina M.D.   On: 11/06/2018 07:48   Dg Chest Port 1 View  Result Date: 11/05/2018 CLINICAL DATA:  Hypoxia.  Shortness of breath. EXAM: PORTABLE CHEST 1 VIEW COMPARISON:  Two-view chest x-ray 11/04/2018.  CTA chest 11/04/2018 FINDINGS: The heart is enlarged. Atherosclerotic changes are again noted at the aortic arch. Pulmonary vascular congestion and mild diffuse edema has increased. Increasing bibasilar airspace opacities are present. The visualized soft tissues and bony thorax are unremarkable. IMPRESSION: 1. Increasing interstitial and airspace disease likely reflecting edema and congestive heart failure. 2. Bibasilar airspace disease likely reflects atelectasis. Infection is not excluded. Electronically Signed   By: San Morelle M.D.   On: 11/05/2018 07:15   ECHOCARDIOGRAM IMPRESSIONS    1. The left ventricle has normal systolic function with an ejection fraction of 60-65%. The cavity size was normal. Left ventricular diastolic Doppler parameters are consistent with impaired relaxation.  2. The right ventricle has moderately reduced systolic function. The cavity was moderately enlarged. There is no increase in right ventricular wall thickness. Right ventricular systolic pressure is severely elevated with an estimated pressure of 70.3  mmHg.  3. There is flattening of the interventricular septum during diastole c/w RV volume overload.  4. Left atrial size was mildly dilated.  5. Right atrial size was moderately dilated.  6. Tricuspid valve regurgitation is mild-moderate.  7. Pulmonary hypertension is severe.  8. The inferior vena cava was dilated in size with <50% respiratory variability.  9. The interatrial septum was not assessed.  FINDINGS  Left Ventricle: The left ventricle has normal systolic function, with an ejection fraction of 60-65%. The cavity size was normal. There is no  increase in left ventricular wall thickness. Left ventricular diastolic Doppler parameters are consistent with  impaired relaxation.  Right Ventricle: The right ventricle has moderately reduced systolic function. The cavity was moderately enlarged. There is no increase in right ventricular wall thickness. Right ventricular systolic pressure is severely elevated with an estimated  pressure of 70.3 mmHg. There is flattening of the interventricular septum during diastole c/w RV volume overload.  Left Atrium: Left atrial size was mildly dilated.  Right Atrium:  Right atrial size was moderately dilated. Right atrial pressure is estimated at 15 mmHg.  Interatrial Septum: The interatrial septum was not assessed.  Pericardium: There is no evidence of pericardial effusion.  Mitral Valve: The mitral valve is normal in structure. Mitral valve regurgitation is trivial by color flow Doppler.  Tricuspid Valve: The tricuspid valve is normal in structure. Tricuspid valve regurgitation is mild-moderate by color flow Doppler.  Aortic Valve: The aortic valve is normal in structure. Aortic valve regurgitation was not visualized by color flow Doppler.  Pulmonic Valve: The pulmonic valve was grossly normal. Pulmonic valve regurgitation is not visualized by color flow Doppler.  Pulmonary Artery: Pulmonary hypertension is severe.  Venous: The inferior vena cava is dilated in size with less than 50% respiratory variability.    +--------------+--------++ LEFT VENTRICLE              +----------------+---------++ +--------------+--------++      Diastology                PLAX 2D                     +----------------+---------++ +--------------+--------++      LV e' lateral:  6.64 cm/s LVIDd:        3.57 cm       +----------------+---------++ +--------------+--------++      LV E/e' lateral:12.2      LVIDs:        2.16 cm        +----------------+---------++ +--------------+--------++      LV e' medial:   4.79 cm/s LV PW:        1.15 cm       +----------------+---------++ +--------------+--------++      LV E/e' medial: 16.9      LV IVS:       0.81 cm       +----------------+---------++ +--------------+--------++ LVOT diam:    1.90 cm  +--------------+--------++ LV SV:        38 ml    +--------------+--------++ LV SV Index:  19.28    +--------------+--------++ LVOT Area:    2.84 cm +--------------+--------++                        +--------------+--------++   +------------------+---------++ LV Volumes (MOD)            +------------------+---------++ LV area d, A2C:   32.30 cm +------------------+---------++ LV area d, A4C:   26.50 cm +------------------+---------++ LV area s, A2C:   14.00 cm +------------------+---------++ LV area s, A4C:   15.10 cm +------------------+---------++ LV major d, A2C:  7.36 cm   +------------------+---------++ LV major d, A4C:  6.59 cm   +------------------+---------++ LV major s, A2C:  5.64 cm   +------------------+---------++ LV major s, A4C:  5.82 cm   +------------------+---------++ LV vol d, MOD A2C:117.0 ml  +------------------+---------++ LV vol d, MOD A4C:87.2 ml   +------------------+---------++ LV vol s, MOD A2C:32.3 ml   +------------------+---------++ LV vol s, MOD A4C:32.9 ml   +------------------+---------++ LV SV MOD A2C:    84.7 ml   +------------------+---------++ LV SV MOD A4C:    87.2 ml   +------------------+---------++ LV SV MOD BP:     74.2 ml   +------------------+---------++  +---------------+---------++ RIGHT VENTRICLE          +---------------+---------++ RV Basal diam: 4.94 cm   +---------------+---------++ RV Mid diam:   4.11 cm   +---------------+---------++ RV FAC:        10.2 %     +---------------+---------++  RV S prime:    8.81 cm/s +---------------+---------++ RVOT diam:     3.15 cm   +---------------+---------++ TAPSE (M-mode):1.5 cm    +---------------+---------++ RVSP:          70.3 mmHg +---------------+---------++  +---------------+-------++-----------++ LEFT ATRIUM           Index       +---------------+-------++-----------++ LA diam:       3.30 cm1.76 cm/m  +---------------+-------++-----------++ LA Vol (A2C):  66.6 ml35.60 ml/m +---------------+-------++-----------++ LA Vol (A4C):  72.8 ml38.92 ml/m +---------------+-------++-----------++ LA Biplane Vol:70.3 ml37.58 ml/m +---------------+-------++-----------++ +------------+----------++-----------++ RIGHT ATRIUM          Index       +------------+----------++-----------++ RA Pressure:15.00 mmHg            +------------+----------++-----------++ RA Area:    26.10 cm             +------------+----------++-----------++ RA Volume:  83.90 ml  44.85 ml/m +------------+----------++-----------++  +------------------+-----------++ +--------------+--------++ AORTIC VALVE                  PULMONIC VALVE         +------------------+-----------++ +--------------+--------++ AV Area (Vmax):   2.32 cm    PV Vmax:      0.79 m/s +------------------+-----------++ +--------------+--------++ AV Area (Vmean):  2.34 cm    PV Peak grad: 2.5 mmHg +------------------+-----------++ +--------------+--------++ AV Area (VTI):    2.21 cm    +------------------+-----------++ AV Vmax:          103.00 cm/s +------------------+-----------++ AV Vmean:         61.000 cm/s +------------------+-----------++ AV VTI:           0.201 m     +------------------+-----------++ AV Peak Grad:     4.2 mmHg    +------------------+-----------++ AV Mean Grad:     2.0 mmHg     +------------------+-----------++ LVOT Vmax:        84.20 cm/s  +------------------+-----------++ LVOT Vmean:       50.300 cm/s +------------------+-----------++ LVOT VTI:         0.157 m     +------------------+-----------++ LVOT/AV VTI ratio:0.78        +------------------+-----------++   +-------------+-------++ AORTA                +-------------+-------++ Ao Root diam:3.00 cm +-------------+-------++ Ao Asc diam: 3.40 cm +-------------+-------++  +--------------+----------++  +---------------+-----------++ MITRAL VALVE              TRICUSPID VALVE            +--------------+----------++  +---------------+-----------++ MV Area (PHT):3.39 cm    TR Peak grad:  55.3 mmHg   +--------------+----------++  +---------------+-----------++ MV PHT:       64.96 msec  TR Vmax:       414.00 cm/s +--------------+----------++  +---------------+-----------++ MV Decel Time:224 msec    Estimated RAP: 15.00 mmHg  +--------------+----------++  +---------------+-----------++ +--------------+-----------++ RVSP:          70.3 mmHg   MV E velocity:81.00 cm/s  +---------------+-----------++ +--------------+-----------++ MV A velocity:131.00 cm/s +--------------+-------+ +--------------+-----------++ SHUNTS                MV E/A ratio: 0.62        +--------------+-------+ +--------------+-----------++ Systemic VTI: 0.16 m                                +--------------+-------+  Systemic Diam:1.90 cm                               +--------------+-------+                               Pulmonic Diam:3.15 cm                               +--------------+-------+  Subjective: Examined at bedside and she is done well and states her breathing was improved and states that her leg swelling has improved significantly.  No nausea or vomiting.  Denies chest pain, answer dizziness.   Weakness is improving slowly.  No other concerns or plans at this time is ready to go home.  Discharge Exam: Vitals:   11/12/18 0741 11/12/18 1012  BP:  124/65  Pulse:  90  Resp:    Temp:    SpO2: 93%    Vitals:   11/12/18 0500 11/12/18 0502 11/12/18 0741 11/12/18 1012  BP:  (!) 97/57  124/65  Pulse:  81  90  Resp:  18    Temp:  97.9 F (36.6 C)    TempSrc:  Oral    SpO2:  90% 93%   Weight: 92 kg     Height:       General: Pt is alert, awake, not in acute distress Cardiovascular: RRR, S1/S2 +, no rubs, no gallops Respiratory: Diminished bilaterally, no wheezing, no rhonchi Abdominal: Soft, NT, Distended 2/2 body habitus, bowel sounds + Extremities: 1+ LE edema, no cyanosis  The results of significant diagnostics from this hospitalization (including imaging, microbiology, ancillary and laboratory) are listed below for reference.    Microbiology: Recent Results (from the past 240 hour(s))  SARS Coronavirus 2 (CEPHEID - Performed in [place] hospital lab), Hosp Order     Status: None   Collection Time: 11/04/18 11:02 AM   Specimen: Nasopharyngeal Swab  Result Value Ref Range Status   SARS Coronavirus 2 NEGATIVE NEGATIVE Final    Comment: (NOTE) If result is NEGATIVE SARS-CoV-2 target nucleic acids are NOT DETECTED. The SARS-CoV-2 RNA is generally detectable in upper and lower  respiratory specimens during the acute phase of infection. The lowest  concentration of SARS-CoV-2 viral copies this assay can detect is 250  copies / mL. A negative result does not preclude SARS-CoV-2 infection  and should not be used as the sole basis for treatment or other  patient management decisions.  A negative result may occur with  improper specimen collection / handling, submission of specimen other  than nasopharyngeal swab, presence of viral mutation(s) within the  areas targeted by this assay, and inadequate number of viral copies  (<250 copies / mL). A negative result must be  combined with clinical  observations, patient history, and epidemiological information. If result is POSITIVE SARS-CoV-2 target nucleic acids are DETECTED. The SARS-CoV-2 RNA is generally detectable in upper and lower  respiratory specimens dur ing the acute phase of infection.  Positive  results are indicative of active infection with SARS-CoV-2.  Clinical  correlation with patient history and other diagnostic information is  necessary to determine patient infection status.  Positive results do  not rule out bacterial infection or co-infection with other viruses. If result is PRESUMPTIVE POSTIVE SARS-CoV-2 nucleic acids MAY BE PRESENT.   A presumptive positive result  was obtained on the submitted specimen  and confirmed on repeat testing.  While 2019 novel coronavirus  (SARS-CoV-2) nucleic acids may be present in the submitted sample  additional confirmatory testing may be necessary for epidemiological  and / or clinical management purposes  to differentiate between  SARS-CoV-2 and other Sarbecovirus currently known to infect humans.  If clinically indicated additional testing with an alternate test  methodology [phone]) is advised. The SARS-CoV-2 RNA is generally  detectable in upper and lower respiratory sp ecimens during the acute  phase of infection. The expected result is Negative. Fact Sheet for Patients:  StrictlyIdeas.no Fact Sheet for Healthcare Providers: BankingDealers.co.za This test is not yet approved or cleared by the [place] FDA and has been authorized for detection and/or diagnosis of SARS-CoV-2 by FDA under an Emergency Use Authorization (EUA).  This EUA will remain in effect (meaning this test can be used) for the duration of the COVID-19 declaration under Section 564(b)(1) of the Act, 21 U.S.C. section 360bbb-3(b)(1), unless the authorization is terminated or revoked sooner. Performed at [facility], [place] [address] [place], [place] [address]   Culture, Urine     Status: None   Collection Time: 11/05/18  5:56 AM   Specimen: Urine, Catheterized  Result Value Ref Range Status   Specimen Description   Final    URINE, CATHETERIZED Performed at [place] [address] [place], [place] [address]    Special Requests   Final    NONE Performed at [facility], [place] [address], [place], [place] [address]    Culture   Final    NO GROWTH Performed at [facility], [place] [address] [place], [place] [address]    Report Status 11/06/2018 FINAL  Final  MRSA PCR Screening     Status: None   Collection Time: 11/05/18  6:52 AM   Specimen: Nasal Mucosa; Nasopharyngeal  Result Value Ref Range Status   MRSA by PCR NEGATIVE NEGATIVE Final    Comment:        The GeneXpert MRSA Assay (FDA approved for NASAL specimens only), is one component of a comprehensive MRSA colonization surveillance program. It is not intended to diagnose MRSA infection nor to guide or monitor treatment for MRSA infections. Performed at [facility], [place] [address], [place], [place] [address]     Labs: BNP (last 3 results) Recent Labs    11/04/18 1105  BNP 427.0*   Basic Metabolic Panel: Recent Labs  Lab 11/08/18 0532 11/09/18 0507 11/10/18 0505 11/11/18 0528 11/12/18 0954  NA 135 136 138 138 136  K 3.6 3.6 3.4* 3.6 3.3*  CL 86* 85* 87* 87* 84*  CO2 37* 38* 36* 36* 36*  GLUCOSE 115* 96 94 105* 155*  BUN 30* 31* 37* 35* 33*  CREATININE 0.94 0.79 0.82 0.76 0.85  CALCIUM 9.1 9.0 9.2 9.1 9.4  MG 2.4 2.6* 2.7* 2.5* 2.3  PHOS  --   --   --  4.1 4.1   Liver Function Tests: Recent Labs  Lab 11/11/18 0528 11/12/18 0954  AST 33 31  ALT 37 34  ALKPHOS 98 112  BILITOT 0.8 0.8  PROT 7.0 7.4  ALBUMIN 3.4* 3.6   No results for input(s): LIPASE, AMYLASE in the last 168 hours. No results for input(s): AMMONIA in the last 168  hours. CBC: Recent Labs  Lab 11/06/18 0307 11/07/18 0209 11/08/18 0532 11/11/18 0528 11/12/18 0954  WBC 9.3 9.5  7.7 8.6 7.1  NEUTROABS  --   --   --  4.7 4.1  HGB 16.4* 16.9* 16.7* 16.4* 16.9*  HCT 55.4* 56.1* 55.2* 55.2* 56.5*  MCV 91.3 89.2 88.2 89.9 89.1  PLT 226 263 237 211 226   Cardiac Enzymes: No results for input(s): CKTOTAL, CKMB, CKMBINDEX, TROPONINI in the last 168 hours. BNP: Invalid input(s): POCBNP CBG: Recent Labs  Lab 11/11/18 0756 11/11/18 1119 11/11/18 1629 11/11/18 2037 11/12/18 0852  GLUCAP 95 124* 106* 132* 120*   D-Dimer No results for input(s): DDIMER in the last 72 hours. Hgb A1c No results for input(s): HGBA1C in the last 72 hours. Lipid Profile No results for input(s): CHOL, HDL, LDLCALC, TRIG, CHOLHDL, LDLDIRECT in the last 72 hours. Thyroid function studies No results for input(s): TSH, T4TOTAL, T3FREE, THYROIDAB in the last 72 hours.  Invalid input(s): FREET3 Anemia work up No results for input(s): VITAMINB12, FOLATE, FERRITIN, TIBC, IRON, RETICCTPCT in the last 72 hours. Urinalysis    Component Value Date/Time   COLORURINE YELLOW 11/05/2018 0556   APPEARANCEUR CLEAR 11/05/2018 0556   LABSPEC 1.013 11/05/2018 0556   PHURINE 5.0 11/05/2018 0556   GLUCOSEU NEGATIVE 11/05/2018 0556   GLUCOSEU NEGATIVE 03/13/2010 0826   HGBUR SMALL (A) 11/05/2018 0556   HGBUR negative 03/08/2009 0802   BILIRUBINUR NEGATIVE 11/05/2018 0556   BILIRUBINUR negative 04/14/2013 1057   BILIRUBINUR n 03/31/2012 1124   KETONESUR NEGATIVE 11/05/2018 0556   PROTEINUR 30 (A) 11/05/2018 0556   UROBILINOGEN 0.2 04/14/2013 1057   UROBILINOGEN 1.0 03/13/2010 0826   NITRITE NEGATIVE 11/05/2018 0556   LEUKOCYTESUR NEGATIVE 11/05/2018 0556   Sepsis Labs Invalid input(s): PROCALCITONIN,  WBC,  LACTICIDVEN Microbiology Recent Results (from the past 240 hour(s))  SARS Coronavirus 2 (CEPHEID - Performed in [place] hospital lab), Hosp Order     Status: None    Collection Time: 11/04/18 11:02 AM   Specimen: Nasopharyngeal Swab  Result Value Ref Range Status   SARS Coronavirus 2 NEGATIVE NEGATIVE Final    Comment: (NOTE) If result is NEGATIVE SARS-CoV-2 target nucleic acids are NOT DETECTED. The SARS-CoV-2 RNA is generally detectable in upper and lower  respiratory specimens during the acute phase of infection. The lowest  concentration of SARS-CoV-2 viral copies this assay can detect is 250  copies / mL. A negative result does not preclude SARS-CoV-2 infection  and should not be used as the sole basis for treatment or other  patient management decisions.  A negative result may occur with  improper specimen collection / handling, submission of specimen other  than nasopharyngeal swab, presence of viral mutation(s) within the  areas targeted by this assay, and inadequate number of viral copies  (<250 copies / mL). A negative result must be combined with clinical  observations, patient history, and epidemiological information. If result is POSITIVE SARS-CoV-2 target nucleic acids are DETECTED. The SARS-CoV-2 RNA is generally detectable in upper and lower  respiratory specimens dur ing the acute phase of infection.  Positive  results are indicative of active infection with SARS-CoV-2.  Clinical  correlation with patient history and other diagnostic information is  necessary to determine patient infection status.  Positive results do  not rule out bacterial infection or co-infection with other viruses. If result is PRESUMPTIVE POSTIVE SARS-CoV-2 nucleic acids MAY BE PRESENT.   A presumptive positive result was obtained on the submitted specimen  and confirmed on repeat testing.  While 2019 novel coronavirus  (SARS-CoV-2) nucleic acids may be present in the  submitted sample  additional confirmatory testing may be necessary for epidemiological  and / or clinical management purposes  to differentiate between  SARS-CoV-2 and other Sarbecovirus  currently known to infect humans.  If clinically indicated additional testing with an alternate test  methodology [phone]) is advised. The SARS-CoV-2 RNA is generally  detectable in upper and lower respiratory sp ecimens during the acute  phase of infection. The expected result is Negative. Fact Sheet for Patients:  StrictlyIdeas.no Fact Sheet for Healthcare Providers: BankingDealers.co.za This test is not yet approved or cleared by the [place] FDA and has been authorized for detection and/or diagnosis of SARS-CoV-2 by FDA under an Emergency Use Authorization (EUA).  This EUA will remain in effect (meaning this test can be used) for the duration of the COVID-19 declaration under Section 564(b)(1) of the Act, 21 U.S.C. section 360bbb-3(b)(1), unless the authorization is terminated or revoked sooner. Performed at [facility], [place] [address], [place], [place] [address]   Culture, Urine     Status: None   Collection Time: 11/05/18  5:56 AM   Specimen: Urine, Catheterized  Result Value Ref Range Status   Specimen Description   Final    URINE, CATHETERIZED Performed at [place] [address] [place], [place] [address]    Special Requests   Final    NONE Performed at [facility], [place] [address] [place], [place] [address]    Culture   Final    NO GROWTH Performed at [facility], [place] [address], [place], [place] [address]    Report Status 11/06/2018 FINAL  Final  MRSA PCR Screening     Status: None   Collection Time: 11/05/18  6:52 AM   Specimen: Nasal Mucosa; Nasopharyngeal  Result Value Ref Range Status   MRSA by PCR NEGATIVE NEGATIVE Final    Comment:        The GeneXpert MRSA Assay (FDA approved for NASAL specimens only), is one component of a comprehensive MRSA colonization surveillance program. It is not intended to diagnose MRSA infection nor to  guide or monitor treatment for MRSA infections. Performed at [facility], [place] [address], [place], [place] [address]    Time coordinating discharge: 35 minutes  SIGNED:  Kerney Elbe, DO [facility] 11/12/2018, 11:13 AM Pager is on AMION  If 7PM-7AM, please contact night-coverage [email or web address] Password [email or web address]

## 2018-11-12 NOTE — Progress Notes (Signed)
Pt will use [employer] for HH O2 and [facility] for [facility] needs. Referral given to in home rep.

## 2018-11-12 NOTE — Care Management Important Message (Signed)
Important Message  Patient Details  Name: Rebecca Mcintyre MRN: [id number] Date of Birth: 1945-09-06   Medicare Important Message Given:  Yes. CMA printed out IM for Case Manager Nurse, Cookie McGibboney to give to the patient.     Minaal Struckman 11/12/2018, 9:26 AM

## 2018-11-12 NOTE — Progress Notes (Signed)
All discharge instructions, including medications, discussed with patient. No further questions from patient. Home oxygen tank sent with patient as well as all personal belongings.

## 2018-11-13 ENCOUNTER — Telehealth: Payer: Self-pay | Admitting: *Deleted

## 2018-11-13 DIAGNOSIS — I5033 Acute on chronic diastolic (congestive) heart failure: Secondary | ICD-10-CM | POA: Diagnosis not present

## 2018-11-13 NOTE — Telephone Encounter (Signed)
Transition Care Management Follow-up Telephone Call   Date discharged? 11/12/2018   How have you been since you were released from the hospital?"I'm doing okay "   Do you understand why you were in the hospital? yes   Do you understand the discharge instructions? yes   Where were you discharged to? Home with Home Health    Items Reviewed:  Medications reviewed: yes  Allergies reviewed: yes  Dietary changes reviewed: yes Heart healthy carb modified with fluid restiriction  Referrals reviewed: yes   Functional Questionnaire:   Activities of Daily Living (ADLs):   She states they are independent in the following: ambulation, bathing and hygiene, feeding, continence, grooming, toileting and dressing States they require assistance with the following: N/A   Any transportation issues/concerns?: no   Any patient concerns? no   Confirmed importance and date/time of follow-up visits scheduled yes   Provider Appointment booked with [name] 11/17/2018 at 3:15 PM virtually   Confirmed with patient if condition begins to worsen call PCP or go to the ER.  Patient was given the office number and encouraged to call back with question or concerns.  : yes

## 2018-11-14 DIAGNOSIS — Z7984 Long term (current) use of oral hypoglycemic drugs: Secondary | ICD-10-CM | POA: Diagnosis not present

## 2018-11-14 DIAGNOSIS — Z9181 History of falling: Secondary | ICD-10-CM | POA: Diagnosis not present

## 2018-11-14 DIAGNOSIS — M109 Gout, unspecified: Secondary | ICD-10-CM | POA: Diagnosis not present

## 2018-11-14 DIAGNOSIS — J9612 Chronic respiratory failure with hypercapnia: Secondary | ICD-10-CM | POA: Diagnosis not present

## 2018-11-14 DIAGNOSIS — I110 Hypertensive heart disease with heart failure: Secondary | ICD-10-CM | POA: Diagnosis not present

## 2018-11-14 DIAGNOSIS — I272 Pulmonary hypertension, unspecified: Secondary | ICD-10-CM | POA: Diagnosis not present

## 2018-11-14 DIAGNOSIS — E785 Hyperlipidemia, unspecified: Secondary | ICD-10-CM | POA: Diagnosis not present

## 2018-11-14 DIAGNOSIS — J439 Emphysema, unspecified: Secondary | ICD-10-CM | POA: Diagnosis not present

## 2018-11-14 DIAGNOSIS — I5030 Unspecified diastolic (congestive) heart failure: Secondary | ICD-10-CM | POA: Diagnosis not present

## 2018-11-14 DIAGNOSIS — I2510 Atherosclerotic heart disease of native coronary artery without angina pectoris: Secondary | ICD-10-CM | POA: Diagnosis not present

## 2018-11-14 DIAGNOSIS — J9611 Chronic respiratory failure with hypoxia: Secondary | ICD-10-CM | POA: Diagnosis not present

## 2018-11-14 DIAGNOSIS — E119 Type 2 diabetes mellitus without complications: Secondary | ICD-10-CM | POA: Diagnosis not present

## 2018-11-17 ENCOUNTER — Encounter: Payer: Self-pay | Admitting: Internal Medicine

## 2018-11-17 ENCOUNTER — Ambulatory Visit (INDEPENDENT_AMBULATORY_CARE_PROVIDER_SITE_OTHER): Payer: Medicare Other | Admitting: Internal Medicine

## 2018-11-17 ENCOUNTER — Other Ambulatory Visit: Payer: Self-pay

## 2018-11-17 VITALS — Wt 204.0 lb

## 2018-11-17 DIAGNOSIS — E119 Type 2 diabetes mellitus without complications: Secondary | ICD-10-CM | POA: Diagnosis not present

## 2018-11-17 DIAGNOSIS — Z79899 Other long term (current) drug therapy: Secondary | ICD-10-CM | POA: Diagnosis not present

## 2018-11-17 DIAGNOSIS — D582 Other hemoglobinopathies: Secondary | ICD-10-CM | POA: Diagnosis not present

## 2018-11-17 DIAGNOSIS — I509 Heart failure, unspecified: Secondary | ICD-10-CM

## 2018-11-17 DIAGNOSIS — E876 Hypokalemia: Secondary | ICD-10-CM

## 2018-11-17 DIAGNOSIS — J439 Emphysema, unspecified: Secondary | ICD-10-CM

## 2018-11-17 DIAGNOSIS — J9690 Respiratory failure, unspecified, unspecified whether with hypoxia or hypercapnia: Secondary | ICD-10-CM | POA: Diagnosis not present

## 2018-11-17 DIAGNOSIS — Z09 Encounter for follow-up examination after completed treatment for conditions other than malignant neoplasm: Secondary | ICD-10-CM

## 2018-11-17 NOTE — Progress Notes (Signed)
Virtual Visit via Video Note  I connected with@ on 11/17/18 at  3:15 PM EDT by a video enabled telemedicine application and verified that I am speaking with the correct person using two identifiers. Location patient: home Location provider:work  office Persons participating in the virtual visit: patient, provider  [place] national recommendations  regarding COVID 19 pandemic   video visit is advised over in office visit for this patient.  Patient aware  of the limitations of evaluation and management by telemedicine and  availability of in person appointments. and agreed to proceed.   HPI: Rebecca Mcintyre presents for video visit  For fu hosp  transitional care    hosp after fall with resp failure  And underlying copd  Noted   And felt to have acute on [facility]   Diureses   Noted to have PUlm HT on echo    And send Hom eon O2 support and to fu with Dr [name] in July .  Since dc last Thursday she noted less urine output and puffy feet today  Weight up from 200 - 2004 but  Had good diuresis today   Asks if should adjust lasix .  Feels pretty good  Taking one potassium supplement per day   HH has reached out but no fu yet  For PT etc.   Daughter Margarita Grizzle helping out.   usin nicotine patch  No tobacco   bg had been acceptable  ocass inuslin in hospital    Admit date: 11/04/2018 Discharge date: 11/12/2018  Admitted From: Home Disposition: Home with [place]  Recommendations for Outpatient Follow-up:  1. Follow up with PCP in 1-2 weeks 2. Follow up with Cardiology Dr. Percival Spanish in 1-2 weeks 3. Follow up with Pulmonary as an outpatient; Appointment scheduled for 2 weeks and will follow up with Dr. [name] after PFTs and Sleep Study 4. Please obtain CMP/CBC, Mag, Phos in one week 5. Please follow up on the following pending results:  Home Health: Yes Equipment/Devices: Oxygen 3 Liters; Rolling Walker   Discharge Condition: Stable CODE STATUS: FULL CODE Diet recommendation:  Heart Healthy carb modified diet with 1500 mL Fluid Restriction  ROS: See pertinent positives and negatives per HPI. No fever falling  New gi sx   Past Medical History:  Diagnosis Date  . Allergy   . ASCUS on Pap smear    had repeat x 2 normal with [name] Plan 2011  . Gout   . Hyperlipidemia   . Hypertension   . Myocardial infarction [facility])    2002 95% proximal LAD stenosis with thrombus followed by 90% stenosis, the first diagonal 80% stenosis, circumflex 30% stenosis, circumflex obtuse marginal subbranch 70% stenosis, PDA 40% stenosis. She had a drug-eluting stent placement with a Pixel stent  . Osteopenia   . Solitary kidney     Past Surgical History:  Procedure Laterality Date  . [place]  2011  . LAD Stent angioplasty  2002  . Right oophorectomy with salpingectomy     benign growth Dr. Joneen Caraway    Family History  Problem Relation Age of Onset  . Breast cancer Mother 49  . Lung cancer Father   . Heart attack Brother        Died age 36 MI  . Colon cancer Brother   . Heart disease Daughter 60       smoker and overweight   . Esophageal cancer Neg Hx   . Pancreatic cancer Neg Hx   . Rectal cancer  Neg Hx   . Stomach cancer Neg Hx     Social History   Tobacco Use  . Smoking status: Former Smoker    Types: Cigarettes  . Smokeless tobacco: Never Used  . Tobacco comment: 37 years  Substance Use Topics  . Alcohol use: Yes    Comment: 1-2 per month  . Drug use: No      Current Outpatient Medications:  .  acetaminophen (TYLENOL) 500 MG tablet, Take 1,000 mg by mouth every 6 (six) hours as needed for mild pain., Disp: , Rfl:  .  albuterol (VENTOLIN HFA) 108 (90 Base) MCG/ACT inhaler, Inhale 2 puffs into the lungs every 6 (six) hours as needed for wheezing or shortness of breath., Disp: 1 Inhaler, Rfl: 1 .  allopurinol (ZYLOPRIM) 100 MG tablet, TAKE 1 TABLET BY MOUTH EVERY DAY, Disp: 90 tablet, Rfl: 1 .  aspirin 81 MG tablet, Take 81 mg by mouth daily.,  Disp: , Rfl:  .  diclofenac sodium (VOLTAREN) 1 % GEL, Apply 2 g topically 4 (four) times daily. Rub into affected area of foot 2 to 4 times daily (Patient not taking: Reported on 11/04/2018), Disp: 100 g, Rfl: 2 .  furosemide (LASIX) 40 MG tablet, Take 1 tablet (40 mg total) by mouth daily., Disp: 60 tablet, Rfl: 0 .  guaiFENesin (MUCINEX) 600 MG 12 hr tablet, Take 1 tablet (600 mg total) by mouth 2 (two) times daily for 5 days., Disp: 10 tablet, Rfl: 0 .  metFORMIN (GLUCOPHAGE) 500 MG tablet, TAKE 1 TABLET (500 MG TOTAL) BY MOUTH 2 (TWO) TIMES DAILY WITH A MEAL., Disp: 180 tablet, Rfl: 1 .  metoprolol tartrate (LOPRESSOR) 25 MG tablet, TAKE 1/2 TABLETS 2 TIMES DAILY BY MOUTH. (Patient taking differently: Take 12.5 mg by mouth 2 (two) times daily. ), Disp: 90 tablet, Rfl: 1 .  montelukast (SINGULAIR) 10 MG tablet, TAKE 1 TABLET (10 MG TOTAL) AT BEDTIME BY MOUTH., Disp: 90 tablet, Rfl: 0 .  nicotine (NICODERM CQ - DOSED IN MG/24 HOURS) 21 mg/24hr patch, Place 1 patch (21 mg total) onto the skin daily., Disp: 28 patch, Rfl: 0 .  nitroGLYCERIN (NITROSTAT) 0.4 MG SL tablet, Place 1 tablet (0.4 mg total) under the tongue every 5 (five) minutes as needed., Disp: 30 tablet, Rfl: 0 .  ondansetron (ZOFRAN) 4 MG tablet, Take 1 tablet (4 mg total) by mouth every 6 (six) hours as needed for nausea., Disp: 20 tablet, Rfl: 0 .  potassium chloride SA (K-DUR) 20 MEQ tablet, Take 1 tablet (20 mEq total) by mouth daily for 30 days., Disp: 30 tablet, Rfl: 0 .  simvastatin (ZOCOR) 40 MG tablet, TAKE 1 TABLET AT BEDTIME BY MOUTH. (Patient taking differently: Take 40 mg by mouth at bedtime. ), Disp: 90 tablet, Rfl: 1  EXAM: BP Readings from Last 3 Encounters:  11/12/18 (!) 118/55  03/25/18 122/62  02/24/18 113/63   Wt Readings from Last 3 Encounters:  11/12/18 202 lb 13.2 oz (92 kg)  03/25/18 199 lb 12.8 oz (90.6 kg)  12/30/17 198 lb 11.2 oz (90.1 kg)     VITALS per patient if applicable:  Weight reported 204   Looks well on nasal cannula   GENERAL: alert, oriented, appears well and in no acute distress HEENT: atraumatic, conjunttiva clear, no obvious abnormalities on inspection of external nose and ears NECK: normal movements of the head and neck LUNGS: on inspection no signs of respiratory distress, breathing rate appears normal, no obvious gross SOB, gasping or wheezing  nl speech CV: no obvious cyanosis  PSYCH/NEURO: pleasant and cooperative, no obvious depression or anxiety, speech and thought processing grossly intact Lab Results  Component Value Date   WBC 7.1 11/12/2018   HGB 16.9 (H) 11/12/2018   HCT 56.5 (H) 11/12/2018   PLT 226 11/12/2018   GLUCOSE 155 (H) 11/12/2018   CHOL 142 03/10/2018   TRIG 137.0 03/10/2018   HDL 40.60 03/10/2018   LDLCALC 74 03/10/2018   ALT 34 11/12/2018   AST 31 11/12/2018   NA 136 11/12/2018   K 3.3 (L) 11/12/2018   CL 84 (L) 11/12/2018   CREATININE 0.85 11/12/2018   BUN 33 (H) 11/12/2018   CO2 36 (H) 11/12/2018   TSH 1.266 11/05/2018   HGBA1C 7.0 (H) 11/03/2018   MICROALBUR 1.1 03/10/2017    ASSESSMENT AND PLAN:  Discussed the following assessment and plan:    ICD-10-CM   1. Respiratory failure, unspecified chronicity, unspecified whether with hypoxia or hypercapnia ([place])  J96.90   2. Medication management  Z79.899 Comprehensive metabolic panel    Magnesium    Phosphorus    CBC with Differential/Platelet  3. Elevated hemoglobin (HCC)  D58.2 Comprehensive metabolic panel    Magnesium    Phosphorus    CBC with Differential/Platelet  4. Hypokalemia  E87.6 Comprehensive metabolic panel    Magnesium    Phosphorus    CBC with Differential/Platelet  5. Type 2 diabetes mellitus without complication, without long-term current use of insulin (HCC)  E11.9 Comprehensive metabolic panel    Magnesium    Phosphorus    CBC with Differential/Platelet  6. Congestive heart failure, unspecified HF chronicity, unspecified heart failure type (HCC)  I50.9  Comprehensive metabolic panel    Magnesium    Phosphorus    CBC with Differential/Platelet  7. Hospital discharge follow-up  Z09   8. Pulmonary emphysema, unspecified emphysema type ([place])  J43.9    Potassium   F/u   Lab next week  Fu pulm  Planned  Take lasix bid for 5 days and cont potassium  Plan labs next week  Do daily weights    Plan rov with me in about a month after pulmonary eval etc .  Stay tobacco free  Counseled.   About plan and  Fu   Expectant management and discussion of plan and treatment with opportunity to ask questions and all were answered. The patient agreed with the plan and demonstrated an understanding of the instructions.   Advised to call back or seek an in-person evaluation if worsening  or having  further concerns .  I provided  minutes of non-face-to-face time during this encounter.    Shanon Ace, MD

## 2018-11-26 ENCOUNTER — Ambulatory Visit (INDEPENDENT_AMBULATORY_CARE_PROVIDER_SITE_OTHER): Payer: Medicare Other | Admitting: Acute Care

## 2018-11-26 ENCOUNTER — Encounter: Payer: Self-pay | Admitting: Acute Care

## 2018-11-26 ENCOUNTER — Other Ambulatory Visit: Payer: Self-pay

## 2018-11-26 VITALS — BP 132/70 | HR 75 | Ht 62.0 in | Wt 204.8 lb

## 2018-11-26 DIAGNOSIS — J9602 Acute respiratory failure with hypercapnia: Secondary | ICD-10-CM | POA: Diagnosis not present

## 2018-11-26 DIAGNOSIS — D751 Secondary polycythemia: Secondary | ICD-10-CM

## 2018-11-26 DIAGNOSIS — Z5181 Encounter for therapeutic drug level monitoring: Secondary | ICD-10-CM | POA: Diagnosis not present

## 2018-11-26 DIAGNOSIS — J9601 Acute respiratory failure with hypoxia: Secondary | ICD-10-CM | POA: Diagnosis not present

## 2018-11-26 DIAGNOSIS — I2781 Cor pulmonale (chronic): Secondary | ICD-10-CM

## 2018-11-26 DIAGNOSIS — I509 Heart failure, unspecified: Secondary | ICD-10-CM

## 2018-11-26 LAB — BASIC METABOLIC PANEL
BUN: 18 mg/dL (ref 6–23)
CO2: 35 mEq/L — ABNORMAL HIGH (ref 19–32)
Calcium: 9.8 mg/dL (ref 8.4–10.5)
Chloride: 94 mEq/L — ABNORMAL LOW (ref 96–112)
Creatinine, Ser: 0.84 mg/dL (ref 0.40–1.20)
GFR: 66.62 mL/min (ref >60.00)
Glucose, Bld: 92 mg/dL (ref 70–99)
Potassium: 4.3 mEq/L (ref 3.5–5.1)
Sodium: 139 mEq/L (ref 135–145)

## 2018-11-26 LAB — CBC WITH DIFFERENTIAL/PLATELET
Basophils Absolute: 0 10*3/uL (ref 0.0–0.1)
Basophils Relative: 0.6 % (ref 0.0–3.0)
Eosinophils Absolute: 0.2 10*3/uL (ref 0.0–0.7)
Eosinophils Relative: 1.9 % (ref 0.0–5.0)
HCT: 52.2 % — ABNORMAL HIGH (ref 36.0–46.0)
Hemoglobin: 16.9 g/dL — ABNORMAL HIGH (ref 12.0–15.0)
Lymphocytes Relative: 32.8 % (ref 12.0–46.0)
Lymphs Abs: 2.6 10*3/uL (ref 0.7–4.0)
MCHC: 32.4 g/dL (ref 30.0–36.0)
MCV: 85.9 fl (ref 78.0–100.0)
Monocytes Absolute: 0.8 10*3/uL (ref 0.1–1.0)
Monocytes Relative: 10.5 % (ref 3.0–12.0)
Neutro Abs: 4.3 10*3/uL (ref 1.4–7.7)
Neutrophils Relative %: 54.2 % (ref 43.0–77.0)
Platelets: 312 10*3/uL (ref 150.0–400.0)
RBC: 6.08 Mil/uL — ABNORMAL HIGH (ref 3.87–5.11)
RDW: 15.8 % — ABNORMAL HIGH (ref 11.5–15.5)
WBC: 8 10*3/uL (ref 4.0–10.5)

## 2018-11-26 LAB — PHOSPHORUS: Phosphorus: 3.9 mg/dL (ref 2.3–4.6)

## 2018-11-26 LAB — MAGNESIUM: Magnesium: 2.2 mg/dL (ref 1.5–2.5)

## 2018-11-26 MED ORDER — ALBUTEROL SULFATE HFA 108 (90 BASE) MCG/ACT IN AERS: 2.0000 | INHALATION_SPRAY | Freq: Four times a day (QID) | RESPIRATORY_TRACT | 3 refills | Status: DC | PRN

## 2018-11-26 NOTE — Patient Instructions (Addendum)
It is good to see you today. I am glad you are feeling better. We will schedule you for a Pulmonary Function Test. We will schedule you for a sleep study Continue wearing your oxygen at 1 L at rest, 2L at bedtime and 3 L with activity. Saturation goals are 90-92% We will prescribe an albuterol inhaler . Use 2 puffs as needed for shortness of breath or wheezing. We will check a BMET and a CBC  Today, Magnesium and Phosphorous Please follow up with PCP for further lab monitoring of your potassium levels.. Continue your lasix as prescribed Continue weighing yourself every morning at the same time with an empty bladder. Follow [name]' instructions regarding when to take additional lasix.  Follow the fluid restriction per your discharge MD instructions.  Remember to follow a low salt diet. Avoid canned foods  We will schedule you for a heart echo now that you are feeling better.  Follow up with Dr. [name] in 4-6-weeks after the above testing has been completed.  Please contact office for sooner follow up if symptoms do not improve or worsen or seek emergency care

## 2018-11-26 NOTE — Progress Notes (Signed)
History of Present Illness Rebecca Mcintyre is a 73 y.o. female former smoker ( 39 pack year smoking history, smoked 1 pack per day up until admission on 11/04/2018,) with medical history significant for suspected ,  COPD, + for CAD, Diastolic heart failure, remote stent to LAD in 2002, tobacco abuse, dyslipidemia, obesity and recently diagnosed pulmonary hypertension.She was seen as an inpatient as a consult.  She will be followed by Dr. [name]  who also care for her twin sister prior to her death.   12/14/2018 Hospital Follow Up: Admission for acute on chronic hypercarbic and hypoxic resp failure 2/2 decompensated cor pulmonale and diastolic HF.Suspicion of probable COPD and sleep apnea being contributing factors to her chronic respiratory failure, and pulmonary HTN.  Of note, On admission her HGB was 16.9 which supports this being a slow and gradual decline of a chronic condition..  Admit date:11/04/2018 Discharge date:11/12/2018  Admission history: Pt presents for hospital  follow up. She was admitted to [facility] after a fall on 11/04/2018 with respiratory failure which was  acute on chronic DCHF. ( 2002 she had an MI with  1 stent  placed. She was on metoprolol , statin and aspirin prior to admission.  She is followed by Dr. Percival Spanish). She endorsed history of dyspnea in the mornings only x 1 year with progressive worsening for about 2 months which became acutely worse over  2 to 3 weeks prior to admission with.  associated with swelling in her feet and ankles, and abdominal tightness.  She got up early the morning of 11/04/2018 morning, around 5 AM, to use the restroom and remembers waking up on the bathroom floor, hitting her head.  She presented to the emergency room where she was noted to be hypoxic with oxygen saturations in the mid 80s which improved with 2 L nasal oxygen.  Chest x-ray showed changes of COPD, and CT angiogram was negative for pulmonary embolism, but positive for severe emphysema  especially in the bases. She smoked about a pack per day until age 60 then quit and restarted 9 years ago-about 40 pack years. There was no evidence of ILD on CT. She was admitted for treatment of acute hypoxic respiratory failure likely secondary to CHF exacerbation, and chronic respiratory failure.  She was diuresed almost 10 L and her respiratory status improved.Echocardiogram showed diastolic dysfunction, severe PH with RVSP 70 and decreased RV function  She remained on oxygen at 3 L nasal cannula as she continued to have desaturations on room air.She was instructed to maintain her oxygen saturations > 88%  She presents today for follow up. She states she now  realizes how bad she felt before her admission as she now has so much more energy after diuresis of 10L of fluid and the  use of oxygen.  She states she is back to her normal day to day activities. She is wearing her oxygen at 3 L [place]. She has been checking her oxygen saturations and has worn her oxygen at all times. She has a sat monitor and she does not let her saturations drop below 88%. She states she is able to wean to 2 L with rest at times. She has not been able to wean with activity.She is compliant with her Lasix 40 mg once daily. She is weighing herself daily, and she follows  her PCP's extra dosing instructions based on her weight. .She is compliant with her Albuterol HFA for shortness of  Breath . She endorses that she  does have wheezing on occasion with exertion only. She has minimal secretions. Minimal cough.She is not currently smoking.She is using nicotine patches.She denies fever, chest pain, orthopnea or hemoptysis.  Of note : Her Identical Twin sister had a mitral valve prolapse ( per the patient )  and passed away 1 year ago.    Oxygen saturations at hospital follow up: RA at rest were 88% 2 L at rest 92% 1L at rest  94%  We will continue oxygen at 3 L with exertion and 1-2 L at rest.  Test Results: CT chest 6/10: 1. No  pulmonary emboli identified. Evaluation of distal pulmonary arterial branches is limited due to respiratory motion. 2. Coronary artery calcifications. 3. Cardiomegaly. 4. Emphysematous changes in the lungs. 5. Atherosclerotic change in the thoracic aorta without aneurysm.  ECHO 6/10: 1. The left ventricle has normal systolic function with an ejection fraction of 60-65%. The cavity size was normal. Left ventricular diastolic Doppler parameters are consistent with impaired relaxation.2. The right ventricle has moderately reduced systolic function. The cavity was moderately enlarged. There is no increase in right ventricular wall thickness. Right ventricular systolic pressure is severely elevated with an estimated pressure of 70.3  mmHg.3. There is flattening of the interventricular septum during diastole c/w RV volume overload.4. Left atrial size was mildly dilated.5. Right atrial size was moderately dilated.6. Tricuspid valve regurgitation is mild-moderate.7. Pulmonary hypertension is severe.8. The inferior vena cava was dilated in size with <50% respiratory variability.9. The interatrial septum was not assessed.  CBC Latest Ref Rng & Units 11/12/2018 11/11/2018 11/08/2018  WBC 4.0 - 10.5 K/uL 7.1 8.6 7.7  Hemoglobin 12.0 - 15.0 g/dL 16.9(H) 16.4(H) 16.7(H)  Hematocrit 36.0 - 46.0 % 56.5(H) 55.2(H) 55.2(H)  Platelets 150 - 400 K/uL 226 211 237    BMP Latest Ref Rng & Units 11/12/2018 11/11/2018 11/10/2018  Glucose 70 - 99 mg/dL 155(H) 105(H) 94  BUN 8 - 23 mg/dL 33(H) 35(H) 37(H)  Creatinine 0.44 - 1.00 mg/dL 0.85 0.76 0.82  Sodium 135 - 145 mmol/L 136 138 138  Potassium 3.5 - 5.1 mmol/L 3.3(L) 3.6 3.4(L)  Chloride 98 - 111 mmol/L 84(L) 87(L) 87(L)  CO2 22 - 32 mmol/L 36(H) 36(H) 36(H)  Calcium 8.9 - 10.3 mg/dL 9.4 9.1 9.2    BNP    Component Value Date/Time   BNP 586.9 (H) 11/04/2018 1105    ProBNP No results found for: PROBNP  PFT No results found for: FEV1PRE, FEV1POST, FVCPRE,  FVCPOST, TLC, DLCOUNC, PREFEV1FVCRT, PSTFEV1FVCRT  Dg Chest 2 View  Result Date: 11/04/2018 CLINICAL DATA:  Shortness of breath. Fall. EXAM: CHEST - 2 VIEW COMPARISON:  Chest radiographs 03/11/2017 and CT 03/17/2017 FINDINGS: The cardiac silhouette is upper limits of normal in size. Aortic atherosclerosis is noted. There is chronic peribronchial thickening with upper lobe predominant emphysema as shown on CT. No acute airspace consolidation, overt pulmonary edema, pleural effusion, pneumothorax is identified. No acute osseous abnormality is seen. IMPRESSION: COPD without evidence of acute cardiopulmonary process. Electronically Signed   By: Logan Bores M.D.   On: 11/04/2018 11:49   Ct Head Wo Contrast  Result Date: 11/04/2018 CLINICAL DATA:  Pain status post fall. EXAM: CT HEAD WITHOUT CONTRAST TECHNIQUE: Contiguous axial images were obtained from the base of the skull through the vertex without intravenous contrast. COMPARISON:  None. FINDINGS: Brain: No evidence of acute infarction, hemorrhage, hydrocephalus, extra-axial collection or mass lesion/mass effect. The cerebellar tonsils are somewhat low riding, however this is not well visualized on  this exam. Vascular: No hyperdense vessel or unexpected calcification. Skull: Normal. Negative for fracture or focal lesion. Posterior scalp swelling is noted. Sinuses/Orbits: No acute finding. Other: None. IMPRESSION: 1. No acute intracranial abnormality detected. 2. Left posterior scalp swelling without evidence of an underlying fracture. Electronically Signed   By: Constance Holster M.D.   On: 11/04/2018 23:28   Ct Angio Chest Pe W/cm &/or Wo Cm  Result Date: 11/04/2018 CLINICAL DATA:  Decreased O2 saturations. Shortness of breath for 3 weeks. Bilateral foot and ankle swelling for a month. EXAM: CT ANGIOGRAPHY CHEST WITH CONTRAST TECHNIQUE: Multidetector CT imaging of the chest was performed using the standard protocol during bolus administration of  intravenous contrast. Multiplanar CT image reconstructions and MIPs were obtained to evaluate the vascular anatomy. CONTRAST:  132m OMNIPAQUE IOHEXOL 350 MG/ML SOLN COMPARISON:  Chest x-ray November 04, 2018 and chest CT March 17, 2017 FINDINGS: Cardiovascular: Cardiomegaly is identified. There are coronary artery calcifications in the right and left coronary arteries. Atherosclerotic changes seen in the nonaneurysmal aorta without dissection. Evaluation of the pulmonary arteries is limited due to respiratory motion. No pulmonary emboli identified. Mediastinum/Nodes: There is debris within the esophagus. The thyroid is unremarkable. The chest wall is unremarkable. No adenopathy. No effusions. Lungs/Pleura: Respiratory motion limits evaluation. Scattered atelectasis. Emphysematous changes, particularly in the upper lobes. There is bronchial wall thickening in the bases. Central airways are normal. No suspicious nodules, masses, or focal infiltrates. Upper Abdomen: No acute abnormality. Musculoskeletal: No chest wall abnormality. No acute or significant osseous findings. Review of the MIP images confirms the above findings. IMPRESSION: 1. No pulmonary emboli identified. Evaluation of distal pulmonary arterial branches is limited due to respiratory motion. 2. Coronary artery calcifications. 3. Cardiomegaly. 4. Emphysematous changes in the lungs. 5. Atherosclerotic change in the thoracic aorta without aneurysm. Aortic Atherosclerosis (ICD10-I70.0) and Emphysema (ICD10-J43.9). Electronically Signed   By: DDorise BullionIII M.D   On: 11/04/2018 12:46   Dg Chest Port 1 View  Result Date: 11/12/2018 CLINICAL DATA:  Shortness of breath EXAM: PORTABLE CHEST 1 VIEW COMPARISON:  November 11, 2018 FINDINGS: Cardiomediastinal silhouette is stable. Mild increasing opacity in the right base. No other acute abnormalities. IMPRESSION: Mild increased opacity in the right base may represent atelectasis versus developing  infection/pneumonia. No other interval changes. Electronically Signed   By: DDorise BullionIII M.D   On: 11/12/2018 11:51   Dg Chest Port 1 View  Result Date: 11/11/2018 CLINICAL DATA:  Shortness of breath. EXAM: PORTABLE CHEST 1 VIEW COMPARISON:  11/08/2018, 03/11/2017.  CT 11/04/2018. FINDINGS: Mediastinum and hilar structures are normal. Heart size normal. Mild bilateral interstitial prominence again noted consistent with chronic interstitial lung disease. Mild left mid lung field linear density, most likely subsegmental atelectasis. Follow-up exam suggested to demonstrate resolution. No pleural effusion or pneumothorax. No acute bony abnormality. IMPRESSION: 1. Mild left mid lung field linear density most likely subsegmental atelectasis. Follow-up exam to demonstrate resolution suggested. 2.  Chronic interstitial lung disease Electronically Signed   By: TMarcello Moores Register   On: 11/11/2018 12:10   Dg Chest Port 1 View  Result Date: 11/08/2018 CLINICAL DATA:  Shortness of breath. Congestive heart failure. EXAM: PORTABLE CHEST 1 VIEW COMPARISON:  11/06/2018 FINDINGS: Stable mild cardiomegaly. Aortic atherosclerosis. Pulmonary hyperinflation again seen, consistent with COPD. No evidence of pulmonary infiltrate or edema. No evidence of pleural effusion. IMPRESSION: Stable mild cardiomegaly and COPD. No active lung disease. Electronically Signed   By: JEarle GellM.D.   On:  11/08/2018 07:37   Dg Chest Port 1 View  Result Date: 11/06/2018 CLINICAL DATA:  Shortness of breath EXAM: PORTABLE CHEST 1 VIEW COMPARISON:  11/05/2018 FINDINGS: Cardiac shadow is stable. Aortic calcifications are again noted. Mild interstitial changes are noted but slightly improved when compare with the prior exam consistent with improving CHF. No focal infiltrate is seen. No effusion is noted. IMPRESSION: Improving CHF. Electronically Signed   By: Inez Catalina M.D.   On: 11/06/2018 07:48   Dg Chest Port 1 View  Result Date:  11/05/2018 CLINICAL DATA:  Hypoxia.  Shortness of breath. EXAM: PORTABLE CHEST 1 VIEW COMPARISON:  Two-view chest x-ray 11/04/2018.  CTA chest 11/04/2018 FINDINGS: The heart is enlarged. Atherosclerotic changes are again noted at the aortic arch. Pulmonary vascular congestion and mild diffuse edema has increased. Increasing bibasilar airspace opacities are present. The visualized soft tissues and bony thorax are unremarkable. IMPRESSION: 1. Increasing interstitial and airspace disease likely reflecting edema and congestive heart failure. 2. Bibasilar airspace disease likely reflects atelectasis. Infection is not excluded. Electronically Signed   By: San Morelle M.D.   On: 11/05/2018 07:15     Past medical hx Past Medical History:  Diagnosis Date   Allergy    ASCUS on Pap smear    had repeat x 2 normal with [name] Plan 2011   Gout    Hyperlipidemia    Hypertension    Myocardial infarction [facility])    2002 95% proximal LAD stenosis with thrombus followed by 90% stenosis, the first diagonal 80% stenosis, circumflex 30% stenosis, circumflex obtuse marginal subbranch 70% stenosis, PDA 40% stenosis. She had a drug-eluting stent placement with a Pixel stent   Osteopenia    Solitary kidney      Social History   Tobacco Use   Smoking status: Former Smoker    Types: Cigarettes   Smokeless tobacco: Never Used   Tobacco comment: 37 years  Substance Use Topics   Alcohol use: Yes    Comment: 1-2 per month   Drug use: No    Ms.Elbert reports that she has quit smoking. Her smoking use included cigarettes. She has never used smokeless tobacco. She reports current alcohol use. She reports that she does not use drugs.  Tobacco Cessation: 1 pack per day smoking history for total pack year history of 39 years. Quit upon admission 11/04/2018.   Past surgical hx, Family hx, Social hx all reviewed.  Current Outpatient Medications on File Prior to Visit  Medication Sig    acetaminophen (TYLENOL) 500 MG tablet Take 1,000 mg by mouth every 6 (six) hours as needed for mild pain.   albuterol (VENTOLIN HFA) 108 (90 Base) MCG/ACT inhaler Inhale 2 puffs into the lungs every 6 (six) hours as needed for wheezing or shortness of breath.   allopurinol (ZYLOPRIM) 100 MG tablet TAKE 1 TABLET BY MOUTH EVERY DAY   aspirin 81 MG tablet Take 81 mg by mouth daily.   diclofenac sodium (VOLTAREN) 1 % GEL Apply 2 g topically 4 (four) times daily. Rub into affected area of foot 2 to 4 times daily   furosemide (LASIX) 40 MG tablet Take 1 tablet (40 mg total) by mouth daily.   metFORMIN (GLUCOPHAGE) 500 MG tablet TAKE 1 TABLET (500 MG TOTAL) BY MOUTH 2 (TWO) TIMES DAILY WITH A MEAL.   metoprolol tartrate (LOPRESSOR) 25 MG tablet TAKE 1/2 TABLETS 2 TIMES DAILY BY MOUTH. (Patient taking differently: Take 12.5 mg by mouth 2 (two) times daily. )   montelukast (  SINGULAIR) 10 MG tablet TAKE 1 TABLET (10 MG TOTAL) AT BEDTIME BY MOUTH.   nicotine (NICODERM CQ - DOSED IN MG/24 HOURS) 21 mg/24hr patch Place 1 patch (21 mg total) onto the skin daily.   nitroGLYCERIN (NITROSTAT) 0.4 MG SL tablet Place 1 tablet (0.4 mg total) under the tongue every 5 (five) minutes as needed.   ondansetron (ZOFRAN) 4 MG tablet Take 1 tablet (4 mg total) by mouth every 6 (six) hours as needed for nausea.   potassium chloride SA (K-DUR) 20 MEQ tablet Take 1 tablet (20 mEq total) by mouth daily for 30 days.   simvastatin (ZOCOR) 40 MG tablet TAKE 1 TABLET AT BEDTIME BY MOUTH. (Patient taking differently: Take 40 mg by mouth at bedtime. )   No current facility-administered medications on file prior to visit.      Allergies  Allergen Reactions   Atorvastatin     REACTION: myalgias  and liver    Review Of Systems:  Constitutional:   No  weight loss, night sweats,  Fevers, chills, fatigue, or  lassitude.  HEENT:   No headaches,  Difficulty swallowing,  Tooth/dental problems, or  Sore throat,                 No sneezing, itching, ear ache, nasal congestion, post nasal drip,   CV:  No chest pain,  + Orthopnea, NoPND, trace swelling in lower extremities, No anasarca, dizziness, palpitations, syncope.   GI  No heartburn, indigestion, abdominal pain, nausea, vomiting, diarrhea, change in bowel habits, loss of appetite, bloody stools.   Resp: +  shortness of breath with exertion or at rest without her oxygen.  No excess mucus, no productive cough,  No non-productive cough,  No coughing up of blood.  No change in color of mucus. + occasional  wheezing.  No chest wall deformity  Skin: no rash or lesions.  GU: no dysuria, change in color of urine, no urgency or frequency.  No flank pain, no hematuria   MS:  No joint pain or swelling.  No decreased range of motion.  No back pain.  Psych:  No change in mood or affect. No depression or anxiety.  No memory loss.   Vital Signs BP 132/70 (BP Location: Right Arm, Cuff Size: Normal)    Pulse 75    Ht _0  (1.575 m)    Wt 204 lb 12.8 oz (92.9 kg)    SpO2 94%    BMI 37.46 kg/m    Physical Exam:  General- No distress, wearing oxygen at 3 L [place], sats 94% ob 3 L [place] ENT: No sinus tenderness, TM clear, pale nasal mucosa, no oral exudate,no post nasal drip, no LAN Cardiac: S1, S2, regular rate and rhythm, no murmur Chest: + faint expiratory wheeze, / No rales/ dullness; no accessory muscle use, no nasal flaring, no sternal retractions, No JVD Abd.: Soft Non-tender, ND, BS +, Body mass index is 37.46 kg/m. Ext: No clubbing , cyanosis, trace bilateral lower extremity edema Neuro: Deconditioned, MAE x 4, A&O x 3, appropriate Skin: No rashes, warm and dry Psych: normal mood and behavior   Assessment/Plan  Impression and plan Acute on chronic hypoxic/hypercarbic respiratory failure 2/2 Suspected COPD ( smoker until the day of admission), and OSA ( Obesity) , + diastolic heart failure, and cor pulmonale ( secondary to chronic hypoxia,associated RV dysfxn  and RA dilation, PAH in addition to an hgb of 16.9 on admission ) Unable to wean from oxygen despite diuresis (  see above note re: sats checked in the office) Plan We will continue oxygen at 3 L with exertion and 1-2 L at rest. Saturation goal is > 88% We will schedule a Pulmonary Function test  We will schedule a sleep study to evaluate for Obstructive Sleep Apnea We will schedule a six minute walk test. We will start you on Anoro 1 puff once daily This is a maintenance inhaler, use this every day without fail. We will send in a prescription for an albuterol inhaler. This is your rescue inhaler Use 2 puffs as needed for shortness of breath or wheezing .Do not exceed use  more than 4 times a day.  If you need to use the rescue inhaler frequently, call us to be seen in the office.     Acute cor pulmonale  + diastolic heart failure, and cor pulmonale ( secondary to chronic hypoxia,associated RV dysfxn and RA dilation, PAH in addition to an hgb of 16.9 on admission ) Plan: Continue Lasix 40 mg daily as instructed at discharge Continue weighing yourself every morning at the same time with an empty bladder. Follow [name]' instructions regarding when to take additional lasix.  Follow the fluid restriction per your discharge MD instructions.  Remember to follow a low salt diet. Avoid canned foods  Continued Therapeutic drug  monitoring per PCP.( BMET, Mag and Phos) We will schedule you for a repeat heart echo now that you are feeling better.   Pulmonary hypertension-WHO group 2/3 PAP per Echo  Right ventricular systolic pressure is severely elevated with an estimated pressure of 70.3  mmHg >> Severe PAH  Plan - reassess echo once acute issues resolved  - Cardiology follow up ASAP - consider right heart cath if issues persist  Therapeutic Drug Monitoring Plan BMET, CBC, Mag and Phos 11/26/2018 We will cll you with results Will draw while in the office today as PCP has ordered, and  will minimize patient exposures to another OV. Future labs per PCP  Polycythemia HGB remains 16.9 Plan Continue ASA 81 mg daily as you have been doing. Consider phlebotomy  Follow up CBC at next OV.   Discussion: I am concerned Hanifa has been unable to wean from her oxygen despite diuresis. She did not appear fluid overloaded on exam today in the office. . This is concerning in regard to the severity of her cor pulmonale and diastolic heart failure. She will follow up with Dr [name], or myself if he is unavailable once the above additional diagnostics have been completed in order to ensure we are treating any underlying etiologies.  This appointment was 45 min long with over 50% of the time in direct face-to-face patient care, assessment, plan of care, and follow-up.   [name], NP 11/26/2018  9:56 AM

## 2018-11-29 ENCOUNTER — Encounter: Payer: Self-pay | Admitting: Acute Care

## 2018-11-30 ENCOUNTER — Telehealth: Payer: Self-pay

## 2018-11-30 MED ORDER — ANORO ELLIPTA 62.5-25 MCG/INH IN AEPB: 1.0000 | INHALATION_SPRAY | Freq: Every day | RESPIRATORY_TRACT | 0 refills | Status: DC

## 2018-11-30 NOTE — Telephone Encounter (Signed)
Called and spoke with pt and stated to her all the info from [place] letting her know that we were going to give her samples of anoro for this to be her daily maintenance inhaler and that she is to use the rescue inhaler on an as needed basis. Stated to pt when she comes to pick up samples to have someone show her how to use it and she verbalized understanding. Also made pt aware to contact office before running out of sample if it has been working well for her so we can send an Rx to pharmacy for her. While speaking with pt, also let her know the results of the labwork and pt verbalized understanding of all info stated to her.  Pt stated she would come by office tomorrow, 7/7 to pick up samples. Nothing further needed.

## 2018-11-30 NOTE — Telephone Encounter (Signed)
I called pt and LMOM to have her CB.  Anoro Inhaler, Labs Received: Yesterday Message Contents  [name], NP sent to Jannette Spanner, CMA        Tam, Please call Ms. Orsborn and have her come in to pick up 2 samples of Anoro to try as a therapeutic Trial. Please teach her how to use it. Explain that Anoro is a maintenance inhaler , which she will use once daily without fail regardless of how she feels. Explain the use of her rescue inhaler, so she understands the difference. If she feels it helps her please have her call the office before she runs out so we can prescribe this for her. Let her know we will re-evaluate the inhaler therapy after PFT's to ensure we are treating her lung disease to ensure her total medical benefit.   Let her know her Labs were fine, and that her HGB remains elevated. We will need to re-check this at follow up. Make sure she knows she will follow with her PCP to continue to check this.   Thanks so much.

## 2018-11-30 NOTE — Telephone Encounter (Signed)
Patient is returning phone call.  Patient phone number is [phone].

## 2018-11-30 NOTE — Telephone Encounter (Signed)
Pt returning call and can be reached @ [phone].Rebecca Mcintyre

## 2018-12-02 NOTE — Telephone Encounter (Signed)
So  I can do the refills for the lasix and potassium when due . Make sure you are  weighting yourself  Every day   Tod etect rapid  Fluid retention .  Let us know if you gain more than 3 pounds in a day .   Your potassium level was in range .  The elevated HG was most likely from  Low oxygen stress  Reaction but  May bet better with time.  PLan ROV in  September sometime   .

## 2018-12-03 ENCOUNTER — Telehealth: Payer: Self-pay | Admitting: Internal Medicine

## 2018-12-03 MED ORDER — POTASSIUM CHLORIDE CRYS ER 20 MEQ PO TBCR: 20.0000 meq | EXTENDED_RELEASE_TABLET | Freq: Every day | ORAL | 0 refills | Status: DC

## 2018-12-03 NOTE — Telephone Encounter (Signed)
Yes  Approve orders

## 2018-12-03 NOTE — Telephone Encounter (Signed)
Reuben Likes calling back to cancel request after speaking with patient who states she is not interested.

## 2018-12-03 NOTE — Telephone Encounter (Signed)
Caller/Agency: [facility] Number: [phone]  Requesting OT/PT/Skilled Nursing/Social Work/Speech Therapy: Saw on 11/16/18, PT Frequency: twice a week for 2 weeks- visits have been missed since did not receive the orders ok to resume?  Ok to reassess

## 2018-12-10 ENCOUNTER — Other Ambulatory Visit (HOSPITAL_COMMUNITY)
Admission: RE | Admit: 2018-12-10 | Discharge: 2018-12-10 | Disposition: A | Discharge status: Home / Self Care | Payer: Medicare Other | Source: Ambulatory Visit | Attending: Internal Medicine | Admitting: Internal Medicine

## 2018-12-10 DIAGNOSIS — Z1159 Encounter for screening for other viral diseases: Secondary | ICD-10-CM | POA: Insufficient documentation

## 2018-12-10 LAB — SARS CORONAVIRUS 2 (TAT 6-24 HRS): SARS Coronavirus 2: NEGATIVE

## 2018-12-11 MED ORDER — ANORO ELLIPTA 62.5-25 MCG/INH IN AEPB: 1.0000 | INHALATION_SPRAY | Freq: Every day | RESPIRATORY_TRACT | 5 refills | Status: DC

## 2018-12-11 NOTE — Telephone Encounter (Signed)
Per the 7.6.2020 phone note:  Rebecca Mcintyre, [facility]  11/30/18 9:37 AM Note  I called pt and LMOM to have her CB.   Anoro Inhaler, Labs Received: Yesterday Message Contents  [name], NP sent to Rebecca Mcintyre, CMA          Tam, Please call Rebecca Mcintyre and have her come in to pick up 2 samples of Anoro to try as a therapeutic Trial. Please teach her how to use it. Explain that Anoro is a maintenance inhaler , which she will use once daily without fail regardless of how she feels. Explain the use of her rescue inhaler, so she understands the difference. If she feels it helps her please have her call the office before she runs out so we can prescribe this for her. Let her know we will re-evaluate the inhaler therapy after PFT's to ensure we are treating her lung disease to ensure her total medical benefit.   Let her know her Labs were fine, and that her HGB remains elevated. We will need to re-check this at follow up. Make sure she knows she will follow with her PCP to continue to check this.   Thanks so much.        E-mail sent to patient with Sarah NP's recs for the [facility] and to ask which pharmacy patient would like her Rx sent to.

## 2018-12-12 DIAGNOSIS — J449 Chronic obstructive pulmonary disease, unspecified: Secondary | ICD-10-CM | POA: Diagnosis not present

## 2018-12-12 DIAGNOSIS — I5033 Acute on chronic diastolic (congestive) heart failure: Secondary | ICD-10-CM | POA: Diagnosis not present

## 2018-12-13 ENCOUNTER — Ambulatory Visit (HOSPITAL_BASED_OUTPATIENT_CLINIC_OR_DEPARTMENT_OTHER): Payer: Medicare Other | Attending: Acute Care | Admitting: Internal Medicine

## 2018-12-13 ENCOUNTER — Other Ambulatory Visit: Payer: Self-pay

## 2018-12-13 DIAGNOSIS — I509 Heart failure, unspecified: Secondary | ICD-10-CM | POA: Diagnosis not present

## 2018-12-13 DIAGNOSIS — J9601 Acute respiratory failure with hypoxia: Secondary | ICD-10-CM | POA: Diagnosis not present

## 2018-12-13 DIAGNOSIS — Z87891 Personal history of nicotine dependence: Secondary | ICD-10-CM | POA: Diagnosis not present

## 2018-12-13 DIAGNOSIS — G4761 Periodic limb movement disorder: Secondary | ICD-10-CM | POA: Diagnosis not present

## 2018-12-13 DIAGNOSIS — J9602 Acute respiratory failure with hypercapnia: Secondary | ICD-10-CM | POA: Insufficient documentation

## 2018-12-14 DIAGNOSIS — I5033 Acute on chronic diastolic (congestive) heart failure: Secondary | ICD-10-CM | POA: Diagnosis not present

## 2018-12-16 DIAGNOSIS — G4761 Periodic limb movement disorder: Secondary | ICD-10-CM

## 2018-12-16 DIAGNOSIS — G4736 Sleep related hypoventilation in conditions classified elsewhere: Secondary | ICD-10-CM | POA: Diagnosis not present

## 2018-12-16 DIAGNOSIS — I509 Heart failure, unspecified: Secondary | ICD-10-CM | POA: Diagnosis not present

## 2018-12-16 NOTE — Procedures (Signed)
Patient Name: Rebecca Mcintyre, Rebecca Mcintyre Date: 12/13/2018 Gender: Female D.O.B: 02/24/1946 Age (years): 21 Referring Provider: [name] NP Height (inches): 62 Interpreting Physician: Baird Lyons MD, ABSM Weight (lbs): 200 RPSGT: Lanae Boast BMI: 37 MRN: [id number] Neck Size: 15.00  CLINICAL INFORMATION Sleep Study Type: NPSG Indication for sleep study: Congestive Heart Failure Epworth Sleepiness Score: 8  SLEEP STUDY TECHNIQUE As per the AASM Manual for the Scoring of Sleep and Associated Events v2.3 (April 2016) with a hypopnea requiring 4% desaturations.  The channels recorded and monitored were frontal, central and occipital EEG, electrooculogram (EOG), submentalis EMG (chin), nasal and oral airflow, thoracic and abdominal wall motion, anterior tibialis EMG, snore microphone, electrocardiogram, and pulse oximetry.  MEDICATIONS Medications self-administered by patient taken the night of the study : none reported  SLEEP ARCHITECTURE The study was initiated at 10:41:31 PM and ended at 4:49:18 AM.  Sleep onset time was 52.5 minutes and the sleep efficiency was 72.8%%. The total sleep time was 267.8 minutes.  Stage REM latency was 237.5 minutes.  The patient spent 18.7%% of the night in stage N1 sleep, 75.0%% in stage N2 sleep, 2.6%% in stage N3 and 3.7% in REM.  Alpha intrusion was absent.  Supine sleep was 17.92%.  RESPIRATORY PARAMETERS The overall apnea/hypopnea index (AHI) was 0.0 per hour. There were 0 total apneas, including 0 obstructive, 0 central and 0 mixed apneas. There were 0 hypopneas and 1 RERAs.  The AHI during Stage REM sleep was 0.0 per hour.  AHI while supine was 0.0 per hour.  The mean oxygen saturation was 93.3%. The minimum SpO2 during sleep was 89.0%.  moderate snoring was noted during this study.  CARDIAC DATA The 2 lead EKG demonstrated sinus rhythm. The mean heart rate was 63.0 beats per minute. Other EKG findings include:  PVCs.  LEG MOVEMENT DATA The total PLMS were 0 with a resulting PLMS index of 0.0. Associated arousal with leg movement index was 5.4 .  IMPRESSIONS - No significant obstructive sleep apnea occurred during this study (AHI = 0.0/h). - No significant central sleep apnea occurred during this study (CAI = 0.0/h). - The patient had minimal oxygen desaturation during the study (Min O2 = 89.0%), Mean sat 93.3% on O2 2L through the study. - The patient snored with moderate snoring volume. - EKG findings include PVCs. - Clinically significant periodic limb movements did occur during sleep.  Total Limb Movements 195. Total with arousal 24/ 5.4/ hr..  DIAGNOSIS - Periodic Limb Movement During Sleep (327.51 [G47.61 ICD-10]) - Nocturnal Hypoxemia (327.26 [G47.36 ICD-10])  RECOMMENDATIONS - Consider Mirapex, Requip, or Sinemet for treatment of Periodic Leg Movements of Sleep. - Be careful with alcohol, sedatives and other CNS depressants that may worsen sleep apnea and disrupt normal sleep architecture. - Sleep hygiene should be reviewed to assess factors that may improve sleep quality. - Weight management and regular exercise should be initiated or continued if appropriate.  [Electronically signed] 12/16/2018 02:35 PM  Baird Lyons MD, [place], American Board of Sleep Medicine   NPI: [id number]                         [place], [place] [facility]  ELECTRONICALLY SIGNED ON:  12/16/2018, 2:39 PM [place] PH: [phone]   FX: [phone] [place]

## 2018-12-21 ENCOUNTER — Telehealth: Payer: Self-pay | Admitting: Acute Care

## 2018-12-21 NOTE — Telephone Encounter (Signed)
Primary Pulmonologist: SG (will see RA when able to get scheduled) Last office visit and with whom: 11/26/2018 with SG What do we see them for (pulmonary problems): CHF Last OV assessment/plan: Instructions    Return in about 4 weeks (around 12/24/2018), or if symptoms worsen or fail to improve. It is good to see you today. I am glad you are feeling better. We will schedule you for a Pulmonary Function Test. We will schedule you for a sleep study Continue wearing your oxygen at 1 L at rest, 2L at bedtime and 3 L with activity. Saturation goals are 90-92% We will prescribe an albuterol inhaler . Use 2 puffs as needed for shortness of breath or wheezing. We will check a BMET and a CBC  Today, Magnesium and Phosphorous Please follow up with PCP for further lab monitoring of your potassium levels.. Continue your lasix as prescribed Continue weighing yourself every morning at the same time with an empty bladder. Follow [name]' instructions regarding when to take additional lasix.  Follow the fluid restriction per your discharge MD instructions.  Remember to follow a low salt diet. Avoid canned foods  We will schedule you for a heart echo now that you are feeling better.  Follow up with Dr. [name] in 4-6-weeks after the above testing has been completed.  Please contact office for sooner follow up if symptoms do not improve or worsen or seek emergency care      Was appointment offered to patient (explain)?  Pt wants recommendations. Pt has televisit scheduled Wed. 7/29 with SG to discuss sleep study results   Reason for call: Called and spoke with pt who stated 3 days ago pt began to have chills and took tylenol to help with that. Pt stated she began to sweat, has been having a cough with clear mucus, and also has had a runny nose.  Pt stated she had been feeling well until Friday, 7/24. Pt said that she does wear O2 24/7 and was down to 1L but then due to her symptoms that started, pt had to  bump her O2 up to 2-3L.  Pt has been doing all meds as prescribed.  Pt denies any fever as she stated the last temp today was 97.4 and her highest temp was 98.2  Pt also took her blood sugar fasting and stated that her sugar was 90. She then took it after she ate something and stated that it was 190 after eating.  Pt had to use rescue inhaler once Saturday 7/25, twice Sunday 7/26, and once today 7/26. Pt is using the anoro every morning.  Pt had a covid test prior to the sleep study which came back negative.   Pt said that her daughter had been around a person that tested positive for covid a week ago and was tested for covid today due to the exposure.  Due to pt's symptoms that just began 3 days ago, pt is wanting recommendations and also is asking if she could possibly be retested for covid. Sarah, please advise on all this for pt. Thanks!

## 2018-12-21 NOTE — Telephone Encounter (Signed)
Please schedule with Rebecca Mcintyre for a tele visit tomorrow. Cancel the visit with me for 7/29. Have her go the the ER if she gets worse before her televisit  Tomorrow with [employer]..Thanks

## 2018-12-21 NOTE — Telephone Encounter (Signed)
Called and spoke to pt. Informed her of the recs per SG. Appt made with Beth for 7/28 and canceled with SG. Pt verbalized understanding and denied any further questions or concerns at this time.

## 2018-12-22 ENCOUNTER — Other Ambulatory Visit: Payer: Self-pay

## 2018-12-22 ENCOUNTER — Ambulatory Visit (HOSPITAL_COMMUNITY)
Admission: RE | Admit: 2018-12-22 | Discharge: 2018-12-22 | Disposition: A | Discharge status: Home / Self Care | Payer: Medicare Other | Source: Ambulatory Visit | Attending: Primary Care | Admitting: Primary Care

## 2018-12-22 ENCOUNTER — Telehealth: Payer: Self-pay | Admitting: Primary Care

## 2018-12-22 ENCOUNTER — Ambulatory Visit (INDEPENDENT_AMBULATORY_CARE_PROVIDER_SITE_OTHER): Payer: Medicare Other | Admitting: Primary Care

## 2018-12-22 ENCOUNTER — Encounter: Payer: Self-pay | Admitting: Primary Care

## 2018-12-22 DIAGNOSIS — R0602 Shortness of breath: Secondary | ICD-10-CM | POA: Insufficient documentation

## 2018-12-22 DIAGNOSIS — Z20822 Contact with and (suspected) exposure to covid-19: Secondary | ICD-10-CM

## 2018-12-22 DIAGNOSIS — G2581 Restless legs syndrome: Secondary | ICD-10-CM | POA: Diagnosis not present

## 2018-12-22 DIAGNOSIS — R6889 Other general symptoms and signs: Secondary | ICD-10-CM

## 2018-12-22 DIAGNOSIS — J441 Chronic obstructive pulmonary disease with (acute) exacerbation: Secondary | ICD-10-CM

## 2018-12-22 DIAGNOSIS — J209 Acute bronchitis, unspecified: Secondary | ICD-10-CM

## 2018-12-22 MED ORDER — DOXYCYCLINE HYCLATE 100 MG PO TABS: 100.0000 mg | ORAL_TABLET | Freq: Two times a day (BID) | ORAL | 0 refills | Status: DC

## 2018-12-22 MED ORDER — PREDNISONE 10 MG PO TABS: ORAL_TABLET | ORAL | 0 refills | Status: DC

## 2018-12-22 NOTE — Patient Instructions (Addendum)
  Bronchitis - Presumed underlying COPD - CXR today showed stable findings of emphysema/chronic bronchitis. No acute superimposed findings identified - RX doxycycline 1 tab twice day x 7 days and prednisone 42m x 5 days - Repeat COVID testing  - Stay well hydrated,take tylenol [id number] 4-6 hours as needed for fever - Advise you maintain quaratine until testing comes back  Follow-up - 1 week in office if COVID testing negative - Needs PFTS

## 2018-12-22 NOTE — Assessment & Plan Note (Deleted)
-  CXR with no acute findings

## 2018-12-22 NOTE — Progress Notes (Signed)
Virtual Visit via Telephone Note  I connected with Rebecca Mcintyre on 12/22/18 at 11:30 AM EDT by telephone and verified that I am speaking with the correct person using two identifiers.  Location: Patient: Home Provider: Office   I discussed the limitations, risks, security and privacy concerns of performing an evaluation and management service by telephone and the availability of in person appointments. I also discussed with the patient that there may be a patient responsible charge related to this service. The patient expressed understanding and agreed to proceed.  History of Present Illness: 73 year old female, former smoker (39 pack year hx). PMH significant for suspected COPD, CAD, diastolic HF, tobacco abuse, dyslipidemia, obesity and pulmonary hypertension. Seen as inpatient consult and will be followed by Dr. [name]. Seen by pulmonary NP on 7/2 for hospital follow-up. Needs PFTs.   12/22/2018 Patient contacted today for televist visit with complaints of chills with no fever, night sweats, fatigue, cough, sob and nasal congestion x 4 days. Increased oxygen to 2-3L. Continues Anoro as prescribed, requiring rescue inhaler 1-2 times a day. Covid test prior to Sleep study negative. She did have possible second hand exposure to covid through her daughter on 7/22. Tmax 98.4. Sleep study on 12/13/18 which showed no obstructive sleep apnea, min O2 89%. Periodic limb movements did occur during sleep.   Observations/Objective:  - Pleasant, good spirits - No obvious shortness of breath, wheezing or cough noted during phone conversation  Assessment and Plan:  Bronchitis - Presumed underlying COPD - CXR today showed stable findings of emphysema/chronic bronchitis. No acute superimposed findings identified - RX doxycycline 1 tab BID x 7 days and prednisone 65m x 5 days - Repeat COVID testing  - Advised oral hydration, tylenol 6589mQ4-6 hours prn fever - Needs to maintain quaratine until testing  comes back  Restless leg syndrome - No obstructive sleep apnea - Clinically significant periodic limb movement during sleep - Recommend mirapex, requip or sinemet  - Holding off on starting new medication until acute bronchitis/COPD exacerbation has resolved    Follow Up Instructions:   - FU in 1 week with PFTs  I discussed the assessment and treatment plan with the patient. The patient was provided an opportunity to ask questions and all were answered. The patient agreed with the plan and demonstrated an understanding of the instructions.   The patient was advised to call back or seek an in-person evaluation if the symptoms worsen or if the condition fails to improve as anticipated.  I provided 22 minutes of non-face-to-face time during this encounter.   ElMartyn EhrichNP

## 2018-12-22 NOTE — Assessment & Plan Note (Deleted)
-  Sleep study on 12/13/18 which showed no obstructive sleep apnea, min O2 89%.  - Periodic limb movements did occur during sleep.  - Recommendation consider mirapex, requip or sinemet for periodic leg movements.  - Will wait until URI/bronchitis symptoms have resolved before starting new medication

## 2018-12-22 NOTE — Telephone Encounter (Signed)
Called and spoke with pt letting her know that Beth probably wanted her to go to [facility] for the cxr due to the symptoms she was having. Pt verbalized understanding. Stated to pt that we would call her as soon as we had the results of the cxr and pt verbalized understanding. Nothing further needed.

## 2018-12-23 ENCOUNTER — Other Ambulatory Visit: Payer: Self-pay | Admitting: Internal Medicine

## 2018-12-23 ENCOUNTER — Ambulatory Visit: Payer: Medicare Other | Admitting: Acute Care

## 2018-12-24 LAB — NOVEL CORONAVIRUS, NAA: SARS-CoV-2, NAA: NOT DETECTED

## 2018-12-24 NOTE — Progress Notes (Signed)
Covid testing was negative

## 2018-12-25 ENCOUNTER — Telehealth: Payer: Self-pay | Admitting: Primary Care

## 2018-12-25 ENCOUNTER — Other Ambulatory Visit: Payer: Self-pay | Admitting: Internal Medicine

## 2018-12-25 NOTE — Telephone Encounter (Signed)
Notes recorded by Martyn Ehrich, NP on 12/24/2018 at 10:50 AM EDT  Covid testing was negative   Called and spoke with pt letting her know the results of the covid test. Pt verbalized understanding. Nothing further needed.

## 2018-12-28 NOTE — Progress Notes (Signed)
_0  ID: [name], female    DOB: 02-Jun-1945, 73 y.o.   MRN: [id number]  Chief Complaint  Patient presents with  . Follow-up    breathing improved - had PFT today    Referring provider: Panosh, Standley Brooking, MD  HPI: 73 year old female, former smoker (39 pack year hx). PMH significant for suspected COPD, CAD, diastolic HF, tobacco abuse, dyslipidemia, obesity and pulmonary hypertension. Seen as inpatient consult and will be followed by Dr. [name]. Seen by pulmonary NP on 7/2 for hospital follow-up. Needs PFTs.   12/22/2018 Patient contacted today for televist visit with complaints of chills with no fever, night sweats, fatigue, cough, sob and nasal congestion x 4 days. Increased oxygen to 2-3L. Continues Anoro as prescribed, requiring rescue inhaler 1-2 times a day. Covid test prior to Sleep study negative. She did have possible second hand exposure to covid through her daughter on 7/22. Tmax 98.4. Sleep study on 12/13/18 which showed no obstructive sleep apnea, min O2 89%. Periodic limb movements did occur during sleep. CXR today showed stable findings of emphysema/chronic bronchitis. No acute superimposed findings identified. Treated for bronchitis, presumed underlying COPD, with doxycycline 1 tab BID x 7 days and prednisone 76m x 5 days  12/29/2018 Patient presents today for PFTs. Covid testing was negative. PFTs today showed moderate-severe obstructive lung disease. Her breathing is a lot better after being treated for an acute exacerbation with doxycycline and prednisone x 5 days. Continues using anoro 1 puff daily. Rare rescue inhaler use. Asking if she needs to continue lasix. Will check BMET and BNP. Sleep study showed nor OSA but periodic limb movement. States that she sleeps well.Trial requip for restless leg syndrome. Refer to pulmonary rehab.   PFTs 12/29/2018 FVC 2.31 (82), FEV1 1.20 (56), ratio 52, no bronchodilatory response, moderate-severe diffusion defect   Imaging 11/04/18  CTA Chest- No PE; cardiomegaly and emphysematous changes of the lung  Allergies  Allergen Reactions  . Atorvastatin     REACTION: myalgias  and liver    Immunization History  Administered Date(s) Administered  . Influenza Split 04/29/2011, 04/08/2012  . Influenza Whole 02/16/2008, 03/15/2009, 03/20/2010  . Influenza, High Dose Seasonal PF 04/14/2013, 02/14/2015, 02/28/2016, 03/10/2017, 03/25/2018  . Influenza,inj,Quad PF,6+ Mos 04/18/2014  . Influenza-Unspecified 02/24/2017  . Pneumococcal Conjugate-13 04/08/2012  . Pneumococcal Polysaccharide-23 04/14/2013  . Td 09/14/2007  . Zoster 09/14/2007    Past Medical History:  Diagnosis Date  . Allergy   . ASCUS on Pap smear    had repeat x 2 normal with Dr. RMarvel Plan2011  . Gout   . Hyperlipidemia   . Hypertension   . Myocardial infarction ([facility]    2002 95% proximal LAD stenosis with thrombus followed by 90% stenosis, the first diagonal 80% stenosis, circumflex 30% stenosis, circumflex obtuse marginal subbranch 70% stenosis, PDA 40% stenosis. She had a drug-eluting stent placement with a Pixel stent  . Osteopenia   . Solitary kidney     Tobacco History: Social History   Tobacco Use  Smoking Status Former Smoker  . Packs/day: 1.00  . Years: 39.00  . Pack years: 39.00  . Types: Cigarettes  . Quit date: 11/04/2018  . Years since quitting: 0.1  Smokeless Tobacco Never Used  Tobacco Comment   39   Counseling given: Not Answered Comment: 39   Outpatient Medications Prior to Visit  Medication Sig Dispense Refill  . acetaminophen (TYLENOL) 500 MG tablet Take 1,000 mg by mouth every 6 (six) hours as needed  for mild pain.    [name] albuterol (VENTOLIN HFA) 108 (90 Base) MCG/ACT inhaler Inhale 2 puffs into the lungs every 6 (six) hours as needed for wheezing or shortness of breath. 18 g 3  . allopurinol (ZYLOPRIM) 100 MG tablet TAKE 1 TABLET BY MOUTH EVERY DAY 90 tablet 1  . aspirin 81 MG tablet Take 81 mg by mouth daily.    .  diclofenac sodium (VOLTAREN) 1 % GEL Apply 2 g topically 4 (four) times daily. Rub into affected area of foot 2 to 4 times daily 100 g 2  . furosemide (LASIX) 40 MG tablet Take 1 tablet (40 mg total) by mouth daily. 60 tablet 0  . guaiFENesin (MUCINEX) 600 MG 12 hr tablet Take by mouth 2 (two) times daily.    . metFORMIN (GLUCOPHAGE) 500 MG tablet TAKE 1 TABLET (500 MG TOTAL) BY MOUTH 2 (TWO) TIMES DAILY WITH A MEAL. 180 tablet 1  . metoprolol tartrate (LOPRESSOR) 25 MG tablet TAKE 1/2 TABLETS BY MOUTH 2 TIMES DAILY 90 tablet 1  . montelukast (SINGULAIR) 10 MG tablet TAKE 1 TABLET (10 MG TOTAL) AT BEDTIME BY MOUTH. 90 tablet 0  . nitroGLYCERIN (NITROSTAT) 0.4 MG SL tablet Place 1 tablet (0.4 mg total) under the tongue every 5 (five) minutes as needed. 30 tablet 0  . potassium chloride SA (K-DUR) 20 MEQ tablet Take 1 tablet (20 mEq total) by mouth daily. 30 tablet 0  . simvastatin (ZOCOR) 40 MG tablet TAKE 1 TABLET BY MOUTH AT BEDTIME 90 tablet 1  . umeclidinium-vilanterol (ANORO ELLIPTA) 62.5-25 MCG/INH AEPB Inhale 1 puff into the lungs daily. 1 each 5  . doxycycline (VIBRA-TABS) 100 MG tablet Take 1 tablet (100 mg total) by mouth 2 (two) times daily. 14 tablet 0  . predniSONE (DELTASONE) 10 MG tablet Take 2 tabs x 5 days 10 tablet 0  . ondansetron (ZOFRAN) 4 MG tablet Take 1 tablet (4 mg total) by mouth every 6 (six) hours as needed for nausea. (Patient not taking: Reported on 12/29/2018) 20 tablet 0  . albuterol (PROVENTIL) (2.5 MG/3ML) 0.083% nebulizer solution 2.5 mg      No facility-administered medications prior to visit.     Review of Systems  Review of Systems  Constitutional: Negative.   Respiratory: Negative.   Cardiovascular: Negative.   Psychiatric/Behavioral: Negative.    Physical Exam  BP 128/64 (BP Location: Left Arm, Patient Position: Sitting, Cuff Size: Normal)   Pulse 74   Temp 98 F (36.7 C)   Ht _0  (1.6 m)   Wt 205 lb 9.6 oz (93.3 kg)   SpO2 95%   BMI 36.42  kg/m  Physical Exam Pulmonary:     Effort: Pulmonary effort is normal.     Breath sounds: No wheezing or rhonchi.  Psychiatric:        Mood and Affect: Mood normal.        Behavior: Behavior normal.        Thought Content: Thought content normal.        Judgment: Judgment normal.      Lab Results:  CBC    Component Value Date/Time   WBC 8.0 11/26/2018 1106   RBC 6.08 (H) 11/26/2018 1106   HGB 16.9 (H) 11/26/2018 1106   HCT 52.2 (H) 11/26/2018 1106   PLT 312.0 11/26/2018 1106   MCV 85.9 11/26/2018 1106   MCH 26.7 11/12/2018 0954   MCHC 32.4 11/26/2018 1106   RDW 15.8 (H) 11/26/2018 1106   LYMPHSABS 2.6  11/26/2018 1106   MONOABS 0.8 11/26/2018 1106   EOSABS 0.2 11/26/2018 1106   BASOSABS 0.0 11/26/2018 1106    BMET    Component Value Date/Time   NA 139 11/26/2018 1106   K 4.3 11/26/2018 1106   CL 94 (L) 11/26/2018 1106   CO2 35 (H) 11/26/2018 1106   GLUCOSE 92 11/26/2018 1106   GLUCOSE 121 (H) 03/25/2006 0850   BUN 18 11/26/2018 1106   CREATININE 0.84 11/26/2018 1106   CALCIUM 9.8 11/26/2018 1106   GFRNONAA >60 11/12/2018 0954   GFRAA >60 11/12/2018 0954    BNP    Component Value Date/Time   BNP 586.9 (H) 11/04/2018 1105    ProBNP No results found for: PROBNP  Imaging: Dg Chest 2 View  Result Date: 12/22/2018 CLINICAL DATA:  Increasing shortness of breath with chills and weakness over the last 4 days. History of myocardial infarction and congestive heart failure. EXAM: CHEST - 2 VIEW COMPARISON:  Radiographs 11/12/2018 and 11/01/2018.  CT 11/04/2018. FINDINGS: The heart size and mediastinal contours are stable. There is aortic and coronary artery atherosclerosis. The lungs are hyperinflated with chronic central airway thickening, corresponding with emphysema on previous CT. The pulmonary vascularity is normal. There is no edema, confluent airspace opacity, significant pleural effusion or pneumothorax. The bones appear unremarkable. IMPRESSION: Stable  findings of emphysema/chronic bronchitis. No acute superimposed findings identified. Coronary and Aortic Atherosclerosis (ICD10-I70.0). Electronically Signed   By: Richardean Sale M.D.   On: 12/22/2018 14:30     Assessment & Plan:   COPD (chronic obstructive pulmonary disease) (HCC) - Moderate/severe obstructive lung disease with diffusion defect - Continues Anoro 1 puff daily (needs PA processed) - PRN albuterol q 6 hours  - Encouraged patient to stay active - Refer pulmonary rehab   Restless leg syndrome - Clinically significant periodic limb movement during sleep study - Start 0.39m x 2 night; then increase 0.522mat bedtime for restless leg syndrome (if not beneficial you can stop)   CHF (congestive heart failure) (HCC) - Continue lasix 408maily - Checking BMET and BNP  Acute respiratory failure with hypoxia and hypercapnia (HCC) - Stable, ambulatory O2 low 89% on RA - Use oxygen as needed 1-2L to keep oxygen level >88%      EliMartyn EhrichP 12/29/2018

## 2018-12-29 ENCOUNTER — Encounter: Payer: Self-pay | Admitting: Primary Care

## 2018-12-29 ENCOUNTER — Ambulatory Visit (INDEPENDENT_AMBULATORY_CARE_PROVIDER_SITE_OTHER): Payer: Medicare Other | Admitting: Primary Care

## 2018-12-29 ENCOUNTER — Ambulatory Visit (HOSPITAL_COMMUNITY)
Admission: RE | Admit: 2018-12-29 | Discharge: 2018-12-29 | Disposition: A | Discharge status: Home / Self Care | Payer: Medicare Other | Source: Ambulatory Visit | Attending: Acute Care | Admitting: Acute Care

## 2018-12-29 ENCOUNTER — Other Ambulatory Visit: Payer: Self-pay

## 2018-12-29 VITALS — BP 128/64 | HR 74 | Temp 98.0°F | Ht 63.0 in | Wt 205.6 lb

## 2018-12-29 DIAGNOSIS — I509 Heart failure, unspecified: Secondary | ICD-10-CM | POA: Diagnosis not present

## 2018-12-29 DIAGNOSIS — J9602 Acute respiratory failure with hypercapnia: Secondary | ICD-10-CM | POA: Diagnosis not present

## 2018-12-29 DIAGNOSIS — J449 Chronic obstructive pulmonary disease, unspecified: Secondary | ICD-10-CM

## 2018-12-29 DIAGNOSIS — J9601 Acute respiratory failure with hypoxia: Secondary | ICD-10-CM | POA: Insufficient documentation

## 2018-12-29 DIAGNOSIS — G2581 Restless legs syndrome: Secondary | ICD-10-CM

## 2018-12-29 LAB — PULMONARY FUNCTION TEST
DL/VA % pred: 67 %
DL/VA: 2.8 ml/min/mmHg/L
DLCO unc % pred: 51 %
DLCO unc: 9.62 ml/min/mmHg
FEF 25-75 Post: 0.42 L/sec
FEF 25-75 Pre: 0.4 L/sec
FEF2575-%Change-Post: 6 %
FEF2575-%Pred-Post: 24 %
FEF2575-%Pred-Pre: 22 %
FEV1-%Change-Post: 2 %
FEV1-%Pred-Post: 56 %
FEV1-%Pred-Pre: 55 %
FEV1-Post: 1.2 L
FEV1-Pre: 1.17 L
FEV1FVC-%Change-Post: 1 %
FEV1FVC-%Pred-Pre: 67 %
FEV6-%Change-Post: 2 %
FEV6-%Pred-Post: 78 %
FEV6-%Pred-Pre: 76 %
FEV6-Post: 2.09 L
FEV6-Pre: 2.05 L
FEV6FVC-%Change-Post: 1 %
FEV6FVC-%Pred-Post: 95 %
FEV6FVC-%Pred-Pre: 94 %
FVC-%Change-Post: 0 %
FVC-%Pred-Post: 82 %
FVC-%Pred-Pre: 81 %
FVC-Post: 2.31 L
FVC-Pre: 2.28 L
Post FEV1/FVC ratio: 52 %
Post FEV6/FVC ratio: 91 %
Pre FEV1/FVC ratio: 51 %
Pre FEV6/FVC Ratio: 90 %
RV % pred: 128 %
RV: 2.79 L
TLC % pred: 104 %
TLC: 5.11 L

## 2018-12-29 LAB — BASIC METABOLIC PANEL
BUN: 22 mg/dL (ref 6–23)
CO2: 34 mEq/L — ABNORMAL HIGH (ref 19–32)
Calcium: 10.1 mg/dL (ref 8.4–10.5)
Chloride: 92 mEq/L — ABNORMAL LOW (ref 96–112)
Creatinine, Ser: 0.79 mg/dL (ref 0.40–1.20)
GFR: 71.49 mL/min (ref >60.00)
Glucose, Bld: 102 mg/dL — ABNORMAL HIGH (ref 70–99)
Potassium: 3.9 mEq/L (ref 3.5–5.1)
Sodium: 137 mEq/L (ref 135–145)

## 2018-12-29 LAB — BRAIN NATRIURETIC PEPTIDE: Pro B Natriuretic peptide (BNP): 109 pg/mL — ABNORMAL HIGH (ref 0.0–100.0)

## 2018-12-29 MED ORDER — ROPINIROLE HCL 0.5 MG PO TABS: ORAL_TABLET | ORAL | 3 refills | Status: DC

## 2018-12-29 MED ORDER — ALBUTEROL SULFATE (2.5 MG/3ML) 0.083% IN NEBU
2.5000 mg | INHALATION_SOLUTION | Freq: Once | RESPIRATORY_TRACT | Status: AC
  Administered 2018-12-29: 2.5 mg via RESPIRATORY_TRACT

## 2018-12-29 NOTE — Assessment & Plan Note (Signed)
-  Stable, ambulatory O2 low 89% on RA - Use oxygen as needed 1-2L to keep oxygen level >88%

## 2018-12-29 NOTE — Patient Instructions (Addendum)
  Continue Anoro 1 puff daily  Process prior auth for Anoro (if not covered then call and ask insurance company what LABA/LAMA is covered)  Use Albuterol 2 puffs every 4 hours as needed for breath through shortness of breath/wheezing  Continue Lasix 63m daily until stopped by provider  Start 0.2789mx 2 night; then increase 0.89m70mt bedtime for restless leg syndrome (if not beneficial you can stop)  Take mucinex as needed for congestion  Continue Lasix 27m42mily  Titrate oxygen 1-2L as needed to keep O2 >88%   Labs today Refer to pulmonary rehab   If you have re occurrence of cough of shortness of breath please notify office   Follow up with cardiology or PCP regarding diuretic  Follow up in 3 months with Dr. AlvaElsworth Soho

## 2018-12-29 NOTE — Assessment & Plan Note (Addendum)
-  Moderate/severe obstructive lung disease with diffusion defect - Continues Anoro 1 puff daily (needs PA processed) - PRN albuterol q 6 hours  - Encouraged patient to stay active - Refer pulmonary rehab

## 2018-12-29 NOTE — Assessment & Plan Note (Addendum)
-  Clinically significant periodic limb movement during sleep study - Start 0.42m x 2 night; then increase 0.553mat bedtime for restless leg syndrome (if not beneficial you can stop)

## 2018-12-29 NOTE — Assessment & Plan Note (Signed)
-  Continue lasix 41m daily - Checking BMET and BNP

## 2018-12-29 NOTE — Addendum Note (Signed)
Addended by: Joella Prince on: 12/29/2018 02:22 PM   Modules accepted: Orders

## 2018-12-30 ENCOUNTER — Telehealth: Payer: Self-pay | Admitting: Primary Care

## 2018-12-30 ENCOUNTER — Encounter (HOSPITAL_COMMUNITY): Payer: Self-pay | Admitting: *Deleted

## 2018-12-30 DIAGNOSIS — J449 Chronic obstructive pulmonary disease, unspecified: Secondary | ICD-10-CM

## 2018-12-30 NOTE — Progress Notes (Signed)
Received referral for this pt to participate in Pulmonary Rehab from Dr [name] with the diagnosis of COPD- Moderate. Pt has Covid Risk Score 8.   Reviewed pt follow up appt with Geraldo Pitter NP with Dr. [name].  Reviewed pt most recent PFT which showed a FEV1 post/pred of 56 this shows COPD stage II moderate. Will seek clarification the referral has COPD Stage I.Once clarification is completed, pt is appropriate to contact with insurance benefits and scheduling.

## 2018-12-30 NOTE — Telephone Encounter (Signed)
Referral has been updated. Nothing further was needed.

## 2018-12-31 ENCOUNTER — Other Ambulatory Visit: Payer: Self-pay | Admitting: Internal Medicine

## 2018-12-31 ENCOUNTER — Other Ambulatory Visit: Payer: Self-pay

## 2018-12-31 ENCOUNTER — Encounter: Payer: Self-pay | Admitting: Podiatry

## 2018-12-31 ENCOUNTER — Ambulatory Visit (INDEPENDENT_AMBULATORY_CARE_PROVIDER_SITE_OTHER): Payer: Medicare Other

## 2018-12-31 ENCOUNTER — Ambulatory Visit (INDEPENDENT_AMBULATORY_CARE_PROVIDER_SITE_OTHER): Payer: Medicare Other | Admitting: Podiatry

## 2018-12-31 ENCOUNTER — Telehealth (HOSPITAL_COMMUNITY): Payer: Self-pay

## 2018-12-31 VITALS — Temp 97.6°F

## 2018-12-31 DIAGNOSIS — M779 Enthesopathy, unspecified: Secondary | ICD-10-CM | POA: Diagnosis not present

## 2018-12-31 DIAGNOSIS — M1000 Idiopathic gout, unspecified site: Secondary | ICD-10-CM | POA: Diagnosis not present

## 2018-12-31 MED ORDER — CEPHALEXIN 500 MG PO CAPS: 500.0000 mg | ORAL_CAPSULE | Freq: Three times a day (TID) | ORAL | 0 refills | Status: DC

## 2018-12-31 MED ORDER — COLCHICINE 0.6 MG PO TABS: 0.6000 mg | ORAL_TABLET | Freq: Every day | ORAL | 0 refills | Status: DC

## 2018-12-31 NOTE — Telephone Encounter (Signed)
Pt insurance is active and benefits verified through [facility] Medicare Co-pay $20.00, DED 0/0 met, out of pocket $3,600/$1,510.61 met, co-insurance 0. no pre-authorization required, REF# [id number]

## 2019-01-01 ENCOUNTER — Telehealth (HOSPITAL_COMMUNITY): Payer: Self-pay

## 2019-01-01 DIAGNOSIS — M1000 Idiopathic gout, unspecified site: Secondary | ICD-10-CM | POA: Diagnosis not present

## 2019-01-01 MED ORDER — COLCHICINE 0.6 MG PO TABS: 0.6000 mg | ORAL_TABLET | Freq: Every day | ORAL | 0 refills | Status: DC

## 2019-01-01 MED ORDER — FUROSEMIDE 40 MG PO TABS: 40.0000 mg | ORAL_TABLET | Freq: Every day | ORAL | 0 refills | Status: DC

## 2019-01-01 NOTE — Progress Notes (Signed)
Subjective: 73 year old female presents the office with concerns of right foot pain located on the lateral aspect of the fifth toe.  She is noticed redness swelling and pain to this area which started on Tuesday.  She denies any recent injury.  She is not sure how this started.  She denies any open sores or any drainage.  From the last saw her she is from the hospital she is added Lasix to her medications.  No other respiratory issues as well.  She recently just finished a course of prednisone and antibiotics due to respiratory issue but this was prior to the new symptoms to her foot. Denies any systemic complaints such as fevers, chills, nausea, vomiting. No acute changes since last appointment, and no other complaints at this time.   Objective: AAO x3, NAD DP/PT pulses palpable bilaterally, CRT less than 3 seconds There is localized edema and erythema to the lateral aspect of the right fifth MPJ.  There is tenderness palpation this.  Mild increase in warmth.  There is no ascending cellulitis.  There is no fluctuation crepitation malodor. No pain with calf compression, swelling, warmth, erythema  Assessment: Concern for gout right foot  Plan: -All treatment options discussed with the patient including all alternatives, risks, complications.  -X-rays were obtained reviewed.  No evidence of acute fracture.  No evidence of osteomyelitis or soft tissue edema. -I do believe that she is having a gout flare.  Although she is on allopurinol she does have other changes in her medical history since I last saw her.  Will prescribe colchicine and also Keflex in case of infection.  I ordered blood work including CBC, uric acid. -Patient encouraged to call the office with any questions, concerns, change in symptoms.   Trula Slade DPM

## 2019-01-01 NOTE — Telephone Encounter (Signed)
I refilled the lasix for now  Last cpx was Oct 2019  Please set up for cpx in October   Glad she is doing better!

## 2019-01-02 LAB — CBC WITH DIFFERENTIAL/PLATELET
Absolute Monocytes: 880 cells/uL (ref 200–950)
Basophils Absolute: 70 cells/uL (ref 0–200)
Basophils Relative: 0.8 %
Eosinophils Absolute: 282 cells/uL (ref 15–500)
Eosinophils Relative: 3.2 %
HCT: 42.5 % (ref 35.0–45.0)
Hemoglobin: 14.1 g/dL (ref 11.7–15.5)
Lymphs Abs: 2710 cells/uL (ref 850–3900)
MCH: 27.8 pg (ref 27.0–33.0)
MCHC: 33.2 g/dL (ref 32.0–36.0)
MCV: 83.7 fL (ref 80.0–100.0)
MPV: 10.5 fL (ref 7.5–12.5)
Monocytes Relative: 10 %
Neutro Abs: 4858 cells/uL (ref 1500–7800)
Neutrophils Relative %: 55.2 %
Platelets: 317 10*3/uL (ref 140–400)
RBC: 5.08 10*6/uL (ref 3.80–5.10)
RDW: 15.4 % — ABNORMAL HIGH (ref 11.0–15.0)
Total Lymphocyte: 30.8 %
WBC: 8.8 10*3/uL (ref 3.8–10.8)

## 2019-01-02 LAB — URIC ACID: Uric Acid, Serum: 8 mg/dL — ABNORMAL HIGH (ref 2.5–7.0)

## 2019-01-05 ENCOUNTER — Encounter: Payer: Self-pay | Admitting: Podiatry

## 2019-01-05 ENCOUNTER — Telehealth: Payer: Self-pay | Admitting: *Deleted

## 2019-01-05 ENCOUNTER — Telehealth (HOSPITAL_COMMUNITY): Payer: Self-pay

## 2019-01-05 NOTE — Telephone Encounter (Signed)
Left message to call for results

## 2019-01-05 NOTE — Telephone Encounter (Signed)
-----  Message from Trula Slade, DPM sent at 01/05/2019  7:04 AM EDT ----- Val- please let her know that the uric acid level is up to 8. Can you see how she is feeling with the colchicine? I also want her to go up to taking 240m of allopurinol and I would like to recheck the uric acid level in a month.

## 2019-01-06 ENCOUNTER — Other Ambulatory Visit: Payer: Self-pay | Admitting: Podiatry

## 2019-01-06 DIAGNOSIS — Z803 Family history of malignant neoplasm of breast: Secondary | ICD-10-CM | POA: Diagnosis not present

## 2019-01-06 DIAGNOSIS — Z1231 Encounter for screening mammogram for malignant neoplasm of breast: Secondary | ICD-10-CM | POA: Diagnosis not present

## 2019-01-06 LAB — HM MAMMOGRAPHY

## 2019-01-06 MED ORDER — FLUCONAZOLE 150 MG PO TABS: 150.0000 mg | ORAL_TABLET | Freq: Once | ORAL | 0 refills | Status: AC

## 2019-01-06 NOTE — Telephone Encounter (Signed)
Pt called states the toe is still sore. I informed pt of Dr. Leigh Aurora orders to increase the Allopurinol to 223m daily. Pt states understanding.

## 2019-01-07 ENCOUNTER — Encounter: Payer: Self-pay | Admitting: Podiatry

## 2019-01-07 ENCOUNTER — Other Ambulatory Visit: Payer: Self-pay

## 2019-01-07 ENCOUNTER — Ambulatory Visit (INDEPENDENT_AMBULATORY_CARE_PROVIDER_SITE_OTHER): Payer: Medicare Other | Admitting: Podiatry

## 2019-01-07 VITALS — Temp 97.3°F

## 2019-01-07 DIAGNOSIS — M109 Gout, unspecified: Secondary | ICD-10-CM | POA: Diagnosis not present

## 2019-01-07 DIAGNOSIS — M779 Enthesopathy, unspecified: Secondary | ICD-10-CM

## 2019-01-09 ENCOUNTER — Encounter: Payer: Self-pay | Admitting: Internal Medicine

## 2019-01-12 DIAGNOSIS — J449 Chronic obstructive pulmonary disease, unspecified: Secondary | ICD-10-CM | POA: Diagnosis not present

## 2019-01-12 DIAGNOSIS — I5033 Acute on chronic diastolic (congestive) heart failure: Secondary | ICD-10-CM | POA: Diagnosis not present

## 2019-01-17 NOTE — Progress Notes (Signed)
Subjective: 73 year old female presents the office for follow-up evaluation of gout.  She presents today for steroid injection.  Somewhat improved but still remains swollen and red on the fifth MPJ.  No open sores.  No red streaking. Denies any systemic complaints such as fevers, chills, nausea, vomiting. No acute changes since last appointment, and no other complaints at this time.   Objective: AAO x3, NAD DP/PT pulses palpable bilaterally, CRT less than 3 seconds There is no localized edema and erythema to the right fifth MPJ.  No fluctuation crepitation or any open sores.  No ascending cellulitis. No open lesions or pre-ulcerative lesions.  No pain with calf compression, swelling, warmth, erythema  Assessment: Capsulitis, gout right foot  Plan: -All treatment options discussed with the patient including all alternatives, risks, complications.  -Steroid injection performed.  Discussed topical Betadine. 0.2 cc Kenalog 10, 0.5 cc of Marcaine plain was injected in general the fifth MPJ without complications.  Postinjection care discussed. -She is to follow-up with her primary care physician regards to her other medications. -Patient encouraged to call the office with any questions, concerns, change in symptoms.   Trula Slade DPM

## 2019-01-18 NOTE — Telephone Encounter (Signed)
So the lasix is usually used to keep   Fluid balance to prevent swelling   Diuretics can increase the uric acid level some but not the only factor in elevated uric  acid.  At this point  You could take this every other day  Or we can see if lower dose to 20 mg will be just as helpful   Rebecca Mcintyre  Have her take 20 mg per day  ( 1/2 a pill per day and then have her do  daily weights and let  us know if weight increase  . Cardiology can alos give an opinoin  As indicated

## 2019-01-18 NOTE — Telephone Encounter (Signed)
See my other   Response

## 2019-01-19 ENCOUNTER — Ambulatory Visit (HOSPITAL_COMMUNITY): Payer: Medicare Other | Attending: Cardiology

## 2019-01-19 ENCOUNTER — Other Ambulatory Visit: Payer: Self-pay

## 2019-01-19 DIAGNOSIS — I509 Heart failure, unspecified: Secondary | ICD-10-CM | POA: Diagnosis not present

## 2019-01-22 ENCOUNTER — Other Ambulatory Visit: Payer: Self-pay | Admitting: Internal Medicine

## 2019-01-25 NOTE — Telephone Encounter (Signed)
Pt sent the following MyChart message 01/25/2019 at 3:00 PM EDT:  "I went a week ago and had my echo-cardiogram and wanted to see if it showed anything. I have a appointment with my cardiologist this coming Friday and wanted him to be able to see it. Thanks Rebecca Mcintyre"  Pt had echocardiogram performed 01/19/2019. Routing this message to SG for follow up. SG, please advise with your results and recommendations for this pt. Thank you.

## 2019-01-28 ENCOUNTER — Encounter (HOSPITAL_COMMUNITY)
Admission: RE | Admit: 2019-01-28 | Discharge: 2019-01-28 | Disposition: A | Discharge status: Home / Self Care | Payer: Medicare Other | Source: Ambulatory Visit | Attending: Pulmonary Disease | Admitting: Pulmonary Disease

## 2019-01-28 ENCOUNTER — Encounter (HOSPITAL_COMMUNITY): Payer: Self-pay

## 2019-01-28 ENCOUNTER — Other Ambulatory Visit: Payer: Self-pay

## 2019-01-28 VITALS — BP 130/50 | HR 84 | Ht 63.0 in | Wt 210.1 lb

## 2019-01-28 DIAGNOSIS — J449 Chronic obstructive pulmonary disease, unspecified: Secondary | ICD-10-CM | POA: Insufficient documentation

## 2019-01-28 HISTORY — DX: Atherosclerotic heart disease of native coronary artery without angina pectoris: I25.10

## 2019-01-28 NOTE — Progress Notes (Signed)
Pulmonary Individual Treatment Plan  Patient Details  Name: Rebecca Mcintyre MRN: [id number] Date of Birth: Apr 19, 1946 Referring Provider:     Pulmonary Rehab Walk Test from 01/28/2019 in [place]  Referring Provider  Dr. [name]      Initial Encounter Date:    Pulmonary Rehab Walk Test from 01/28/2019 in [facility]  Date  01/28/19      Visit Diagnosis: Chronic obstructive pulmonary disease, unspecified COPD type ([place])  Patient's Home Medications on Admission:   Current Outpatient Medications:  .  acetaminophen (TYLENOL) 500 MG tablet, Take 1,000 mg by mouth every 6 (six) hours as needed for mild pain., Disp: , Rfl:  .  albuterol (VENTOLIN HFA) 108 (90 Base) MCG/ACT inhaler, Inhale 2 puffs into the lungs every 6 (six) hours as needed for wheezing or shortness of breath., Disp: 18 g, Rfl: 3 .  allopurinol (ZYLOPRIM) 100 MG tablet, TAKE 1 TABLET BY MOUTH EVERY DAY, Disp: 90 tablet, Rfl: 1 .  aspirin 81 MG tablet, Take 81 mg by mouth daily., Disp: , Rfl:  .  furosemide (LASIX) 40 MG tablet, Take 1 tablet (40 mg total) by mouth daily., Disp: 60 tablet, Rfl: 0 .  guaiFENesin (MUCINEX) 600 MG 12 hr tablet, Take by mouth 2 (two) times daily., Disp: , Rfl:  .  KLOR-CON M20 20 MEQ tablet, TAKE 1 TABLET BY MOUTH EVERY DAY, Disp: 30 tablet, Rfl: 0 .  metFORMIN (GLUCOPHAGE) 500 MG tablet, TAKE 1 TABLET (500 MG TOTAL) BY MOUTH 2 (TWO) TIMES DAILY WITH A MEAL., Disp: 180 tablet, Rfl: 1 .  metoprolol tartrate (LOPRESSOR) 25 MG tablet, TAKE 1/2 TABLETS BY MOUTH 2 TIMES DAILY, Disp: 90 tablet, Rfl: 1 .  montelukast (SINGULAIR) 10 MG tablet, TAKE 1 TABLET (10 MG TOTAL) AT BEDTIME BY MOUTH., Disp: 90 tablet, Rfl: 0 .  nitroGLYCERIN (NITROSTAT) 0.4 MG SL tablet, Place 1 tablet (0.4 mg total) under the tongue every 5 (five) minutes as needed., Disp: 30 tablet, Rfl: 0 .  rOPINIRole (REQUIP) 0.5 MG tablet, Start 1/2 tab qhs x 2 nights; then increased  to 1 tab qhs for restless leg, Disp: 30 tablet, Rfl: 3 .  simvastatin (ZOCOR) 40 MG tablet, TAKE 1 TABLET BY MOUTH AT BEDTIME, Disp: 90 tablet, Rfl: 1 .  umeclidinium-vilanterol (ANORO ELLIPTA) 62.5-25 MCG/INH AEPB, Inhale 1 puff into the lungs daily., Disp: 1 each, Rfl: 5 .  cephALEXin (KEFLEX) 500 MG capsule, Take 1 capsule (500 mg total) by mouth 3 (three) times daily., Disp: 30 capsule, Rfl: 0 .  colchicine 0.6 MG tablet, Take 1 tablet (0.6 mg total) by mouth daily., Disp: 7 tablet, Rfl: 0 .  diclofenac sodium (VOLTAREN) 1 % GEL, Apply 2 g topically 4 (four) times daily. Rub into affected area of foot 2 to 4 times daily, Disp: 100 g, Rfl: 2 .  ondansetron (ZOFRAN) 4 MG tablet, Take 1 tablet (4 mg total) by mouth every 6 (six) hours as needed for nausea., Disp: 20 tablet, Rfl: 0  Past Medical History: Past Medical History:  Diagnosis Date  . Allergy   . ASCUS on Pap smear    had repeat x 2 normal with [name] Plan 2011  . Coronary artery disease   . Gout   . Hyperlipidemia   . Hypertension   . Myocardial infarction [facility])    2002 95% proximal LAD stenosis with thrombus followed by 90% stenosis, the first diagonal 80% stenosis, circumflex 30% stenosis, circumflex obtuse  marginal subbranch 70% stenosis, PDA 40% stenosis. She had a drug-eluting stent placement with a Pixel stent  . Osteopenia   . Solitary kidney     Tobacco Use: Social History   Tobacco Use  Smoking Status Former Smoker  . Packs/day: 1.00  . Years: 39.00  . Pack years: 39.00  . Types: Cigarettes  . Quit date: 11/04/2018  . Years since quitting: 0.2  Smokeless Tobacco Never Used  Tobacco Comment   39    Labs: Recent Review Flowsheet Data    Labs for ITP Cardiac and Pulmonary Rehab Latest Ref Rng & Units 03/10/2018 11/03/2018 11/05/2018 11/05/2018 11/05/2018   Cholestrol 0 - 200 mg/dL 142 - - - -   LDLCALC 0 - 99 mg/dL 74 - - - -   HDL >39.00 mg/dL 40.60 - - - -   Trlycerides 0.0 - 149.0 mg/dL 137.0 - - - -    Hemoglobin A1c 4.6 - 6.5 % 6.1 7.0(H) - - -   PHART 7.350 - 7.450 - - 7.278(L) 7.318(L) 7.407   PCO2ART 32.0 - 48.0 mmHg - - 85.1(HH) 81.6(HH) 66.6(HH)   HCO3 20.0 - 28.0 mmol/L - - 38.5(H) 40.7(H) 41.2(H)   O2SAT % - - 99.0 92.9 90.7      Capillary Blood Glucose: Lab Results  Component Value Date   GLUCAP 119 (H) 11/12/2018   GLUCAP 120 (H) 11/12/2018   GLUCAP 132 (H) 11/11/2018   GLUCAP 106 (H) 11/11/2018   GLUCAP 124 (H) 11/11/2018     Pulmonary Assessment Scores: Pulmonary Assessment Scores    Row Name 01/28/19 1541         ADL UCSD   ADL Phase  Entry     SOB Score total  60       CAT Score   CAT Score  pre 11       UCSD: Self-administered rating of dyspnea associated with activities of daily living (ADLs) 6-point scale (0 = "not at all" to 5 = "maximal or unable to do because of breathlessness")  Scoring Scores range from 0 to 120.  Minimally important difference is 5 units  CAT: CAT can identify the health impairment of COPD patients and is better correlated with disease progression.  CAT has a scoring range of zero to 40. The CAT score is classified into four groups of low (less than 10), medium (10 - 20), high (21-30) and very high (31-40) based on the impact level of disease on health status. A CAT score over 10 suggests significant symptoms.  A worsening CAT score could be explained by an exacerbation, poor medication adherence, poor inhaler technique, or progression of COPD or comorbid conditions.  CAT MCID is 2 points  mMRC: mMRC (Modified Medical Research Council) Dyspnea Scale is used to assess the degree of baseline functional disability in patients of respiratory disease due to dyspnea. No minimal important difference is established. A decrease in score of 1 point or greater is considered a positive change.   Pulmonary Function Assessment: Pulmonary Function Assessment - 01/28/19 1509      Breath   Bilateral Breath Sounds  Clear    Shortness of  Breath  Yes       Exercise Target Goals: Exercise Program Goal: Individual exercise prescription set using results from initial 6 min walk test and THRR while considering  patient's activity barriers and safety.   Exercise Prescription Goal: Initial exercise prescription builds to 30-45 minutes a day of aerobic activity, 2-3 days per week.  Home exercise guidelines will be given to patient during program as part of exercise prescription that the participant will acknowledge.  Activity Barriers & Risk Stratification: Activity Barriers & Cardiac Risk Stratification - 01/28/19 1505      Activity Barriers & Cardiac Risk Stratification   Activity Barriers  Deconditioning;Muscular Weakness;Shortness of Breath       6 Minute Walk: 6 Minute Walk    Row Name 01/28/19 1602         6 Minute Walk   Phase  Initial     Distance  600 feet     Walk Time  4.5 minutes     # of Rest Breaks  1     MPH  1.13     METS  1.84     RPE  14     Perceived Dyspnea   2     VO2 Peak  5.2     Symptoms  Yes (comment)     Comments  1.5 minute standing break due to desaturation while on RA. Oxygen administered at 2L and saturations dropped again. Increased to 3 L and saturations were steady at 86-87     Resting HR  78 bpm     Resting BP  114/72     Resting Oxygen Saturation   91 %     Exercise Oxygen Saturation  during 6 min walk  80 %     Max Ex. HR  121 bpm     Max Ex. BP  164/84     2 Minute Post BP  122/68       Interval HR   1 Minute HR  121     2 Minute HR  92     3 Minute HR  86     4 Minute HR  97     5 Minute HR  102     6 Minute HR  107     2 Minute Post HR  82     Interval Heart Rate?  Yes       Interval Oxygen   Interval Oxygen?  Yes     Baseline Oxygen Saturation %  91 %     1 Minute Oxygen Saturation %  84 %     1 Minute Liters of Oxygen  0 L     2 Minute Oxygen Saturation %  80 %     2 Minute Liters of Oxygen  2 L     3 Minute Oxygen Saturation %  90 %     3 Minute Liters  of Oxygen  2 L     4 Minute Oxygen Saturation %  85 %     4 Minute Liters of Oxygen  2 L     5 Minute Oxygen Saturation %  87 %     5 Minute Liters of Oxygen  3 L     6 Minute Oxygen Saturation %  86 %     6 Minute Liters of Oxygen  3 L     2 Minute Post Oxygen Saturation %  96 %     2 Minute Post Liters of Oxygen  1 L        Oxygen Initial Assessment: Oxygen Initial Assessment - 01/28/19 1601      Home Oxygen   Home Oxygen Device  Portable Concentrator;Home Concentrator;E-Tanks    Sleep Oxygen Prescription  Continuous    Liters per minute  2    Home Exercise Oxygen Prescription  Continuous    Liters per minute  1    Home at Rest Exercise Oxygen Prescription  Continuous    Liters per minute  1    Compliance with Home Oxygen Use  Yes      Initial 6 min Walk   Oxygen Used  Continuous    Liters per minute  3      Program Oxygen Prescription   Program Oxygen Prescription  Continuous    Liters per minute  3    Comments  May have to go up to 4 L      Intervention   Short Term Goals  To learn and exhibit compliance with exercise, home and travel O2 prescription;To learn and understand importance of monitoring SPO2 with pulse oximeter and demonstrate accurate use of the pulse oximeter.;To learn and understand importance of maintaining oxygen saturations>88%;To learn and demonstrate proper pursed lip breathing techniques or other breathing techniques.;To learn and demonstrate proper use of respiratory medications    Long  Term Goals  Exhibits compliance with exercise, home and travel O2 prescription;Verbalizes importance of monitoring SPO2 with pulse oximeter and return demonstration;Maintenance of O2 saturations>88%;Exhibits proper breathing techniques, such as pursed lip breathing or other method taught during program session;Compliance with respiratory medication       Oxygen Re-Evaluation:   Oxygen Discharge (Final Oxygen Re-Evaluation):   Initial Exercise  Prescription: Initial Exercise Prescription - 01/28/19 1600      Date of Initial Exercise RX and Referring Provider   Date  01/28/19    Referring Provider  Dr. [name]      Oxygen   Oxygen  Continuous    Liters  3      Recumbant Bike   Level  1    Minutes  15      NuStep   Level  2    SPM  80    Minutes  15      Prescription Details   Frequency (times per week)  2    Duration  Progress to 30 minutes of continuous aerobic without signs/symptoms of physical distress      Intensity   THRR 40-80% of Max Heartrate  59-118    Ratings of Perceived Exertion  11-13    Perceived Dyspnea  0-4      Progression   Progression  Continue to progress workloads to maintain intensity without signs/symptoms of physical distress.      Resistance Training   Training Prescription  Yes    Weight  orange bands    Reps  10-15       Perform Capillary Blood Glucose checks as needed.  Exercise Prescription Changes:   Exercise Comments:   Exercise Goals and Review: Exercise Goals    Row Name 01/28/19 1613             Exercise Goals   Increase Physical Activity  Yes       Intervention  Provide advice, education, support and counseling about physical activity/exercise needs.;Develop an individualized exercise prescription for aerobic and resistive training based on initial evaluation findings, risk stratification, comorbidities and participant's personal goals.       Expected Outcomes  Short Term: Attend rehab on a regular basis to increase amount of physical activity.;Long Term: Add in home exercise to make exercise part of routine and to increase amount of physical activity.;Long Term: Exercising regularly at least 3-5 days a week.       Increase Strength and Stamina  Yes  Intervention  Provide advice, education, support and counseling about physical activity/exercise needs.;Develop an individualized exercise prescription for aerobic and resistive training based on initial evaluation  findings, risk stratification, comorbidities and participant's personal goals.       Expected Outcomes  Short Term: Increase workloads from initial exercise prescription for resistance, speed, and METs.;Short Term: Perform resistance training exercises routinely during rehab and add in resistance training at home;Long Term: Improve cardiorespiratory fitness, muscular endurance and strength as measured by increased METs and functional capacity (6MWT)       Able to understand and use rate of perceived exertion (RPE) scale  Yes       Intervention  Provide education and explanation on how to use RPE scale       Expected Outcomes  Short Term: Able to use RPE daily in rehab to express subjective intensity level;Long Term:  Able to use RPE to guide intensity level when exercising independently       Able to understand and use Dyspnea scale  Yes       Intervention  Provide education and explanation on how to use Dyspnea scale       Expected Outcomes  Short Term: Able to use Dyspnea scale daily in rehab to express subjective sense of shortness of breath during exertion;Long Term: Able to use Dyspnea scale to guide intensity level when exercising independently       Knowledge and understanding of Target Heart Rate Range (THRR)  Yes       Expected Outcomes  Short Term: Able to state/look up THRR;Short Term: Able to use daily as guideline for intensity in rehab;Long Term: Able to use THRR to govern intensity when exercising independently       Understanding of Exercise Prescription  Yes       Intervention  Provide education, explanation, and written materials on patient's individual exercise prescription       Expected Outcomes  Short Term: Able to explain program exercise prescription;Long Term: Able to explain home exercise prescription to exercise independently          Exercise Goals Re-Evaluation :   Discharge Exercise Prescription (Final Exercise Prescription Changes):   Nutrition:  Target Goals:  Understanding of nutrition guidelines, daily intake of sodium <1524m, cholesterol <2013m calories 30% from fat and 7% or less from saturated fats, daily to have 5 or more servings of fruits and vegetables.  Biometrics: Pre Biometrics - 01/28/19 1504      Pre Biometrics   Height  _0  (1.6 m)    Weight  95.3 kg    BMI (Calculated)  37.23    Grip Strength  32 kg        Nutrition Therapy Plan and Nutrition Goals:   Nutrition Assessments:   Nutrition Goals Re-Evaluation:   Nutrition Goals Discharge (Final Nutrition Goals Re-Evaluation):   Psychosocial: Target Goals: Acknowledge presence or absence of significant depression and/or stress, maximize coping skills, provide positive support system. Participant is able to verbalize types and ability to use techniques and skills needed for reducing stress and depression.  Initial Review & Psychosocial Screening: Initial Psych Review & Screening - 01/28/19 1512      Initial Review   Current issues with  None Identified      Family Dynamics   Good Support System?  Yes      Barriers   Psychosocial barriers to participate in program  There are no identifiable barriers or psychosocial needs.      Screening Interventions  Interventions  Encouraged to exercise       Quality of Life Scores:  Scores of 19 and below usually indicate a poorer quality of life in these areas.  A difference of  2-3 points is a clinically meaningful difference.  A difference of 2-3 points in the total score of the Quality of Life Index has been associated with significant improvement in overall quality of life, self-image, physical symptoms, and general health in studies assessing change in quality of life.   PHQ-9: Recent Review Flowsheet Data    Depression screen [facility] 2/9 01/28/2019 03/10/2017 04/26/2016 04/27/2015 04/18/2014   Decreased Interest 0 0 0 0 0   Down, Depressed, Hopeless 0 0 0 0 0   PHQ - 2 Score 0 0 0 0 0   Altered sleeping 0 - - - -    Tired, decreased energy 0 - - - -   Change in appetite 0 - - - -   Feeling bad or failure about yourself  0 - - - -   Trouble concentrating 0 - - - -   Moving slowly or fidgety/restless 0 - - - -   Suicidal thoughts 0 - - - -   PHQ-9 Score 0 - - - -   Difficult doing work/chores Not difficult at all - - - -     Interpretation of Total Score  Total Score Depression Severity:  1-4 = Minimal depression, 5-9 = Mild depression, 10-14 = Moderate depression, 15-19 = Moderately severe depression, 20-27 = Severe depression   Psychosocial Evaluation and Intervention: Psychosocial Evaluation - 01/28/19 1512      Psychosocial Evaluation & Interventions   Interventions  Encouraged to exercise with the program and follow exercise prescription       Psychosocial Re-Evaluation: Psychosocial Re-Evaluation    Row Name 01/28/19 1513             Psychosocial Re-Evaluation   Current issues with  None Identified       Interventions  Encouraged to attend Pulmonary Rehabilitation for the exercise       Continue Psychosocial Services   No Follow up required          Psychosocial Discharge (Final Psychosocial Re-Evaluation): Psychosocial Re-Evaluation - 01/28/19 1513      Psychosocial Re-Evaluation   Current issues with  None Identified    Interventions  Encouraged to attend Pulmonary Rehabilitation for the exercise    Continue Psychosocial Services   No Follow up required        Education: Education Goals: Education classes will be provided on a weekly basis, covering required topics. Participant will state understanding/return demonstration of topics presented.  Learning Barriers/Preferences:   Education Topics: How Lungs Work and Diseases: - Discuss the anatomy of the lungs and diseases that can affect the lungs, such as COPD.   Exercise: -Discuss the importance of exercise, FITT principles of exercise, normal and abnormal responses to exercise, and how to exercise  safely.   Environmental Irritants: -Discuss types of environmental irritants and how to limit exposure to environmental irritants.   Meds/Inhalers and oxygen: - Discuss respiratory medications, definition of an inhaler and oxygen, and the proper way to use an inhaler and oxygen.   Energy Saving Techniques: - Discuss methods to conserve energy and decrease shortness of breath when performing activities of daily living.    Bronchial Hygiene / Breathing Techniques: - Discuss breathing mechanics, pursed-lip breathing technique,  proper posture, effective ways to clear airways, and other functional breathing  techniques   Cleaning Equipment: - Provides group verbal and written instruction about the health risks of elevated stress, cause of high stress, and healthy ways to reduce stress.   Nutrition I: Fats: - Discuss the types of cholesterol, what cholesterol does to the body, and how cholesterol levels can be controlled.   Nutrition II: Labels: -Discuss the different components of food labels and how to read food labels.   Respiratory Infections: - Discuss the signs and symptoms of respiratory infections, ways to prevent respiratory infections, and the importance of seeking medical treatment when having a respiratory infection.   Stress I: Signs and Symptoms: - Discuss the causes of stress, how stress may lead to anxiety and depression, and ways to limit stress.   Stress II: Relaxation: -Discuss relaxation techniques to limit stress.   Oxygen for Home/Travel: - Discuss how to prepare for travel when on oxygen and proper ways to transport and store oxygen to ensure safety.   Knowledge Questionnaire Score: Knowledge Questionnaire Score - 01/28/19 1541      Knowledge Questionnaire Score   Pre Score  16/18       Core Components/Risk Factors/Patient Goals at Admission: Personal Goals and Risk Factors at Admission - 01/28/19 1542      Core Components/Risk  Factors/Patient Goals on Admission   Improve shortness of breath with ADL's  Yes       Core Components/Risk Factors/Patient Goals Review:  Goals and Risk Factor Review    Row Name 01/28/19 1542             Core Components/Risk Factors/Patient Goals Review   Personal Goals Review  Increase knowledge of respiratory medications and ability to use respiratory devices properly.;Improve shortness of breath with ADL's;Develop more efficient breathing techniques such as purse lipped breathing and diaphragmatic breathing and practicing self-pacing with activity.          Core Components/Risk Factors/Patient Goals at Discharge (Final Review):  Goals and Risk Factor Review - 01/28/19 1542      Core Components/Risk Factors/Patient Goals Review   Personal Goals Review  Increase knowledge of respiratory medications and ability to use respiratory devices properly.;Improve shortness of breath with ADL's;Develop more efficient breathing techniques such as purse lipped breathing and diaphragmatic breathing and practicing self-pacing with activity.       ITP Comments:   Comments:

## 2019-01-28 NOTE — Progress Notes (Signed)
Cardiology Office Note   Date:  01/29/2019   ID:  Rebecca Mcintyre, DOB May 18, 1946, MRN [id number]  PCP:  Burnis Medin, MD  Cardiologist:   No primary care provider on file.   Chief Complaint  Patient presents with  . Coronary Artery Disease      History of Present Illness: Rebecca Mcintyre is a 73 y.o. female who presents for evaluation of CAD. I saw her in 2014.  She has a history of an anterior MI in 2002.   She had DES to the LAD.    She was in the hospital after syncopal episode in June.  She had an extensive hospitalization and I reviewed these records.  She had acute hypoxic respiratory failure.  She had acute on chronic diastolic dysfunction.  She had acute cor pulmonale.  She did require some diuresis.  She had an echocardiogram which demonstrated well-preserved left ventricular function with some suggestion of diastolic dysfunction.  There was elevated pulmonary pressures noted.  She had moderate RV dysfunction.  She did have diuresis of 10.2 L.  She was sent home on 40 mg of Lasix.  She was also sent home on home O2 and pulmonary meds as listed.  She is not been able to get follow-up yet with pulmonary.  She did have a follow-up echo and I reviewed this.  Her pulmonary pressures seem to be reduced and RV function improved.  She has since stopped smoking.  She reports that she is no longer having significant shortness of breath that she was at the time of presentation.  She is not having any new swelling.  She is in no weight gain or edema.  Has not had any chest pressure, neck or arm discomfort.  She is not had any further syncope.  She denies any palpitations, presyncope or syncope.   Past Medical History:  Diagnosis Date  . Allergy   . ASCUS on Pap smear    had repeat x 2 normal with [name] Plan 2011  . Coronary artery disease   . Gout   . Hyperlipidemia   . Hypertension   . Myocardial infarction [facility])    2002 95% proximal LAD stenosis with thrombus followed by 90%  stenosis, the first diagonal 80% stenosis, circumflex 30% stenosis, circumflex obtuse marginal subbranch 70% stenosis, PDA 40% stenosis. She had a drug-eluting stent placement with a Pixel stent  . Osteopenia   . Solitary kidney     Past Surgical History:  Procedure Laterality Date  . CARDIAC CATHETERIZATION    . CORONARY ANGIOPLASTY    . CORONARY ANGIOPLASTY WITH STENT PLACEMENT  2002  . [place]  2011  . LAD Stent angioplasty  2002  . Right oophorectomy with salpingectomy     benign growth Dr. Joneen Caraway     Current Outpatient Medications  Medication Sig Dispense Refill  . acetaminophen (TYLENOL) 500 MG tablet Take 1,000 mg by mouth every 6 (six) hours as needed for mild pain.    [name] albuterol (VENTOLIN HFA) 108 (90 Base) MCG/ACT inhaler Inhale 2 puffs into the lungs every 6 (six) hours as needed for wheezing or shortness of breath. 18 g 3  . allopurinol (ZYLOPRIM) 100 MG tablet TAKE 1 TABLET BY MOUTH EVERY DAY 90 tablet 1  . aspirin 81 MG tablet Take 81 mg by mouth daily.    . furosemide (LASIX) 40 MG tablet Take 1 tablet (40 mg total) by mouth daily. 60 tablet 0  . guaiFENesin (MUCINEX) 600  MG 12 hr tablet Take by mouth 2 (two) times daily.    [name] KLOR-CON M20 20 MEQ tablet TAKE 1 TABLET BY MOUTH EVERY DAY 30 tablet 0  . metFORMIN (GLUCOPHAGE) 500 MG tablet TAKE 1 TABLET (500 MG TOTAL) BY MOUTH 2 (TWO) TIMES DAILY WITH A MEAL. 180 tablet 1  . metoprolol tartrate (LOPRESSOR) 25 MG tablet TAKE 1/2 TABLETS BY MOUTH 2 TIMES DAILY 90 tablet 1  . montelukast (SINGULAIR) 10 MG tablet TAKE 1 TABLET (10 MG TOTAL) AT BEDTIME BY MOUTH. 90 tablet 0  . nitroGLYCERIN (NITROSTAT) 0.4 MG SL tablet Place 1 tablet (0.4 mg total) under the tongue every 5 (five) minutes as needed. 30 tablet 0  . rOPINIRole (REQUIP) 0.5 MG tablet Start 1/2 tab qhs x 2 nights; then increased to 1 tab qhs for restless leg 30 tablet 3  . simvastatin (ZOCOR) 40 MG tablet TAKE 1 TABLET BY MOUTH AT BEDTIME 90 tablet 1  .  umeclidinium-vilanterol (ANORO ELLIPTA) 62.5-25 MCG/INH AEPB Inhale 1 puff into the lungs daily. 1 each 5   No current facility-administered medications for this visit.     Allergies:   Atorvastatin    Social History:  The patient  reports that she quit smoking about 2 months ago. Her smoking use included cigarettes. She has a 39.00 pack-year smoking history. She has never used smokeless tobacco. She reports current alcohol use. She reports that she does not use drugs.   Family History:  The patient's family history includes Breast cancer (age of onset: 86) in her mother; Colon cancer in her brother; Heart attack in her brother; Heart disease (age of onset: 25) in her daughter; Lung cancer in her father.    ROS:  Please see the history of present illness.   Otherwise, review of systems are positive for none.   All other systems are reviewed and negative.    PHYSICAL EXAM: VS:  BP (!) 176/78   Pulse 87   Ht _0  (1.6 m)   Wt 208 lb (94.3 kg)   SpO2 90%   BMI 36.85 kg/m  , BMI Body mass index is 36.85 kg/m. GENERAL:  Well appearing HEENT:  Pupils equal round and reactive, fundi not visualized, oral mucosa unremarkable NECK:  No jugular venous distention, waveform within normal limits, carotid upstroke brisk and symmetric, no bruits, no thyromegaly LYMPHATICS:  No cervical, inguinal adenopathy LUNGS:  Clear to auscultation bilaterally BACK:  No CVA tenderness CHEST:  Unremarkable HEART:  PMI not displaced or sustained,S1 and S2 within normal limits, no S3, no S4, no clicks, no rubs, no murmurs ABD:  Flat, positive bowel sounds normal in frequency in pitch, no bruits, no rebound, no guarding, no midline pulsatile mass, no hepatomegaly, no splenomegaly EXT:  2 plus pulses throughout, no edema, no cyanosis no clubbing SKIN:  No rashes no nodules NEURO:  Cranial nerves II through XII grossly intact, motor grossly intact throughout PSYCH:  Cognitively intact, oriented to person place  and time    EKG:  EKG is ordered today. Sinus rhythm, rate 87, axis within normal limits, intervals within normal limits, poor anterior R wave progression, no acute ST-T wave changes.     Recent Labs: 11/04/2018: B Natriuretic Peptide 586.9 11/05/2018: TSH 1.266 11/12/2018: ALT 34 11/26/2018: Magnesium 2.2 12/29/2018: BUN 22; Creatinine, Ser 0.79; Potassium 3.9; Pro B Natriuretic peptide (BNP) 109.0; Sodium 137 01/01/2019: Hemoglobin 14.1; Platelets 317    Lipid Panel    Component Value Date/Time   CHOL 142  03/10/2018 0810   TRIG 137.0 03/10/2018 0810   HDL 40.60 03/10/2018 0810   CHOLHDL 3 03/10/2018 0810   VLDL 27.4 03/10/2018 0810   LDLCALC 74 03/10/2018 0810      Wt Readings from Last 3 Encounters:  01/29/19 208 lb (94.3 kg)  01/28/19 210 lb 1.6 oz (95.3 kg)  12/29/18 205 lb 9.6 oz (93.3 kg)      Other studies Reviewed: Additional studies/ records that were reviewed today include: Extensive hospital records. Review of the above records demonstrates:  Please see elsewhere in the note.     ASSESSMENT AND PLAN:  CAD:    The patient has no new sypmtoms.  No further cardiovascular testing is indicated.  We will continue with aggressive risk reduction and meds as listed.  DYSLIPIDEMIA:     LDL was at target.  No change in therapy.   HTN:  The blood pressure is elevated but this is unusual.  She can keep a blood pressure diary.  No change in therapy.  DIABETES:  Her hemoglobin A1c was 6.   ACUTE ON CHRONIC DIASTOLIC DYSFUNCTION: Patient seems to be euvolemic.  Lasix is exacerbating her gout.  She is been try to do this as needed and reduce her salt and fluid.   Current medicines are reviewed at length with the patient today.  The patient does not have concerns regarding medicines.  The following changes have been made:  no change  Labs/ tests ordered today include: None  Orders Placed This Encounter  Procedures  . EKG 12-Lead     Disposition:   FU with me in  six months.      Signed, Minus Breeding, MD  01/29/2019 5:44 PM    [facility]

## 2019-01-28 NOTE — Progress Notes (Signed)
Rebecca Mcintyre 73 y.o. female Pulmonary Rehab Orientation Note Patient arrived today in Cardiac and Pulmonary Rehab for orientation to Pulmonary Rehab. She walked in from the [place] lot and had desaturated to 84% on RA. She does  carry portable oxygen at times, but did not today.   Per pt, she uses oxygen intermittently. She sleeps with 2L continuous at night and was instructed to use oxygen if her saturations drop below 88%. Color good, skin warm and dry. Patient is oriented to time and place. Patient's medical history, psychosocial health, and medications reviewed. Psychosocial assessment reveals pt lives alone. Pt is currently full time job being a Cabin crew. Pt hobbies include gardening and showing houses. Pt reports her stress level is low. She voices she has no stressors. Pt does not exhibit signs of depression. PHQ2/9 score 0/0. Pt shows good  coping skills with positive outlook . Will continue to monitor and evaluate psychosocial well-being while in pulmonary rehab. Physical assessment reveals heart rate is bradycardic, breath sounds clear to auscultation, no wheezes, rales, or rhonchi. Grip strength equal, strong. Distal pulses 2+ bilateral posterior tibial pulses present with 1+ ankle edema. Patient reports she does take medications as prescribed. Patient states she follows a Low Sodium diet. The patient reports no specific efforts to gain or lose weight.. Patient's weight will be monitored closely. Demonstration and practice of PLB using pulse oximeter. Patient able to return demonstration satisfactorily. Safety and hand hygiene in the exercise area reviewed with patient. Patient voices understanding of the information reviewed. Department expectations discussed with patient and achievable goals were set. The patient shows enthusiasm about attending the program and we look forward to working with this nice lady. The patient completed a 6 min walk test today and to begin exercise on Tuesday,  February 02, 2019 in the 10:00 am class.  See walk test for details, she required 3L of oxygen. [id number]

## 2019-01-29 ENCOUNTER — Ambulatory Visit (INDEPENDENT_AMBULATORY_CARE_PROVIDER_SITE_OTHER): Payer: Medicare Other | Admitting: Cardiology

## 2019-01-29 ENCOUNTER — Encounter: Payer: Self-pay | Admitting: Cardiology

## 2019-01-29 VITALS — BP 176/78 | HR 87 | Ht 63.0 in | Wt 208.0 lb

## 2019-01-29 DIAGNOSIS — I2510 Atherosclerotic heart disease of native coronary artery without angina pectoris: Secondary | ICD-10-CM

## 2019-01-29 DIAGNOSIS — E785 Hyperlipidemia, unspecified: Secondary | ICD-10-CM | POA: Diagnosis not present

## 2019-01-29 DIAGNOSIS — I5033 Acute on chronic diastolic (congestive) heart failure: Secondary | ICD-10-CM | POA: Diagnosis not present

## 2019-01-29 DIAGNOSIS — E118 Type 2 diabetes mellitus with unspecified complications: Secondary | ICD-10-CM | POA: Diagnosis not present

## 2019-01-29 DIAGNOSIS — I10 Essential (primary) hypertension: Secondary | ICD-10-CM

## 2019-01-29 NOTE — Patient Instructions (Signed)
Medication Instructions:  Your physician recommends that you continue on your current medications as directed. Please refer to the Current Medication list given to you today.  If you need a refill on your cardiac medications before your next appointment, please call your pharmacy.   Lab work: NONE If you have labs (blood work) drawn today and your tests are completely normal, you will receive your results only by: [place] (if you have MyChart) OR A paper copy in the mail If you have any lab test that is abnormal or we need to change your treatment, we will call you to review the results.  Testing/Procedures: NONE  Follow-Up: At [facility], you and your health needs are our priority.  As part of our continuing mission to provide you with exceptional heart care, we have created designated Provider Care Teams.  These Care Teams include your primary Cardiologist (physician) and Advanced Practice Providers (APPs -  Physician Assistants and Nurse Practitioners) who all work together to provide you with the care you need, when you need it. You will need a follow up appointment in 6 months.  Please call our office 2 months in advance to schedule this appointment.  You may see Minus Breeding, MD or one of the following Advanced Practice Providers on your designated Care Team:   Rosaria Ferries, PA-C Jory Sims, DNP, ANP

## 2019-02-02 ENCOUNTER — Encounter (HOSPITAL_COMMUNITY)
Admission: RE | Admit: 2019-02-02 | Discharge: 2019-02-02 | Disposition: A | Discharge status: Home / Self Care | Payer: Medicare Other | Source: Ambulatory Visit | Attending: Pulmonary Disease | Admitting: Pulmonary Disease

## 2019-02-02 ENCOUNTER — Other Ambulatory Visit: Payer: Self-pay

## 2019-02-02 VITALS — Wt 211.4 lb

## 2019-02-02 DIAGNOSIS — J449 Chronic obstructive pulmonary disease, unspecified: Secondary | ICD-10-CM

## 2019-02-02 NOTE — Progress Notes (Signed)
Daily Session Note  Patient Details  Name: Rebecca Mcintyre MRN: [id number] Date of Birth: 08/29/1945 Referring Provider:     Pulmonary Rehab Walk Test from 01/28/2019 in [facility]  Referring Provider  Dr. Alva      Encounter Date: 02/02/2019  Check In: Session Check In - 02/02/19 0959      Check-In   Supervising physician immediately available to respond to emergencies  [facility] immediately available    Physician(s)  Dr. Swayze    Location  [facility]    Staff Present  Joan Behrens, RN, BSN;Dalton Fletcher, MS, Exercise Physiologist;Lisa Hughes, RN    Warm-up and Cool-down  Performed as group-led instruction    Resistance Training Performed  Yes    VAD Patient?  No    PAD/SET Patient?  No      Pain Assessment   Currently in Pain?  No/denies       Capillary Blood Glucose: No results found for this or any previous visit (from the past 24 hour(s)). POCT Glucose - 02/02/19 1153      POCT Blood Glucose   Pre-Exercise  102 mg/dL    Post-Exercise  102 mg/dL      Exercise Prescription Changes - 02/02/19 1100      Response to Exercise   Blood Pressure (Admit)  130/62    Blood Pressure (Exercise)  152/78    Blood Pressure (Exit)  102/60    Heart Rate (Admit)  81 bpm    Heart Rate (Exercise)  98 bpm    Heart Rate (Exit)  88 bpm    Oxygen Saturation (Admit)  97 %    Oxygen Saturation (Exercise)  91 %    Oxygen Saturation (Exit)  95 %    Rating of Perceived Exertion (Exercise)  14    Perceived Dyspnea (Exercise)  3    Duration  Continue with 30 min of aerobic exercise without signs/symptoms of physical distress.    Intensity  THRR unchanged      Progression   Progression  Continue to progress workloads to maintain intensity without signs/symptoms of physical distress.      Resistance Training   Training Prescription  Yes    Weight  orange bands    Reps  10-15      Recumbant Bike   Level  1    Minutes  15      NuStep   Level  2    SPM  80    Minutes  15       Social History   Tobacco Use  Smoking Status Former Smoker  . Packs/day: 1.00  . Years: 39.00  . Pack years: 39.00  . Types: Cigarettes  . Quit date: 11/04/2018  . Years since quitting: 0.2  Smokeless Tobacco Never Used  Tobacco Comment   39    Goals Met:  Exercise tolerated well  Goals Unmet:  Not Applicable  Comments: Service time is from 0945 to 1050    Dr. Wesam G. Yacoub is Medical Director for Pulmonary Rehab at [facility]. 

## 2019-02-04 ENCOUNTER — Other Ambulatory Visit: Payer: Self-pay

## 2019-02-04 ENCOUNTER — Encounter (HOSPITAL_COMMUNITY)
Admission: RE | Admit: 2019-02-04 | Discharge: 2019-02-04 | Disposition: A | Discharge status: Home / Self Care | Payer: Medicare Other | Source: Ambulatory Visit | Attending: Pulmonary Disease | Admitting: Pulmonary Disease

## 2019-02-04 DIAGNOSIS — J449 Chronic obstructive pulmonary disease, unspecified: Secondary | ICD-10-CM | POA: Diagnosis not present

## 2019-02-04 NOTE — Progress Notes (Signed)
Daily Session Note  Patient Details  Name: Rebecca Mcintyre MRN: [id number] Date of Birth: [birth year] Referring Provider:     Pulmonary Rehab Walk Test from 01/28/2019 in [place]  Referring Provider  Dr. [name]      Encounter Date: 02/04/2019  Check In: Session Check In - 02/04/19 1005      Check-In   Supervising physician immediately available to respond to emergencies  [facility] immediately available    Physician(s)  Dr. Benny Lennert    Location  [facility]    Staff Present  Rosebud Poles, RN, Bjorn Loser, MS, Exercise Physiologist;Lisa Ysidro Evert, RN    Virtual Visit  No    Medication changes reported      No    Fall or balance concerns reported     No    Tobacco Cessation  No Change    Warm-up and Cool-down  Performed as group-led instruction    Resistance Training Performed  Yes    VAD Patient?  No    PAD/SET Patient?  No      Pain Assessment   Currently in Pain?  No/denies    Multiple Pain Sites  No       Capillary Blood Glucose: No results found for this or any previous visit (from the past 24 hour(s)).    Social History   Tobacco Use  Smoking Status Former Smoker  . Packs/day: 1.00  . Years: 39.00  . Pack years: 39.00  . Types: Cigarettes  . Quit date: 11/04/2018  . Years since quitting: 0.2  Smokeless Tobacco Never Used  Tobacco Comment   39    Goals Met:  Proper associated with RPD/PD & O2 Sat Exercise tolerated well Strength training completed today  Goals Unmet:  Not Applicable  Comments: Service time is from 0955 to [place]    Dr. Rush Farmer is Medical Director for Pulmonary Rehab at [facility].

## 2019-02-05 ENCOUNTER — Ambulatory Visit (INDEPENDENT_AMBULATORY_CARE_PROVIDER_SITE_OTHER): Payer: Medicare Other | Admitting: Internal Medicine

## 2019-02-05 ENCOUNTER — Encounter: Payer: Self-pay | Admitting: Internal Medicine

## 2019-02-05 VITALS — BP 120/72 | HR 80 | Temp 97.5°F | Resp 16 | Ht 63.0 in | Wt 210.2 lb

## 2019-02-05 DIAGNOSIS — Z79899 Other long term (current) drug therapy: Secondary | ICD-10-CM | POA: Diagnosis not present

## 2019-02-05 DIAGNOSIS — E119 Type 2 diabetes mellitus without complications: Secondary | ICD-10-CM

## 2019-02-05 DIAGNOSIS — E790 Hyperuricemia without signs of inflammatory arthritis and tophaceous disease: Secondary | ICD-10-CM | POA: Diagnosis not present

## 2019-02-05 DIAGNOSIS — J439 Emphysema, unspecified: Secondary | ICD-10-CM

## 2019-02-05 DIAGNOSIS — E785 Hyperlipidemia, unspecified: Secondary | ICD-10-CM | POA: Diagnosis not present

## 2019-02-05 LAB — BASIC METABOLIC PANEL
BUN: 21 mg/dL (ref 6–23)
CO2: 30 mEq/L (ref 19–32)
Calcium: 9.9 mg/dL (ref 8.4–10.5)
Chloride: 100 mEq/L (ref 96–112)
Creatinine, Ser: 0.8 mg/dL (ref 0.40–1.20)
GFR: 70.44 mL/min (ref >60.00)
Glucose, Bld: 98 mg/dL (ref 70–99)
Potassium: 4.1 mEq/L (ref 3.5–5.1)
Sodium: 141 mEq/L (ref 135–145)

## 2019-02-05 LAB — HEMOGLOBIN A1C: Hgb A1c MFr Bld: 6.7 % — ABNORMAL HIGH (ref 4.6–6.5)

## 2019-02-05 LAB — LIPID PANEL
Cholesterol: 185 mg/dL (ref 0–200)
HDL: 47 mg/dL (ref >39.00)
NonHDL: 137.54
Total CHOL/HDL Ratio: 4
Triglycerides: 226 mg/dL — ABNORMAL HIGH (ref 0.0–149.0)
VLDL: 45.2 mg/dL — ABNORMAL HIGH (ref 0.0–40.0)

## 2019-02-05 LAB — LDL CHOLESTEROL, DIRECT: Direct LDL: 116 mg/dL

## 2019-02-05 LAB — URIC ACID: Uric Acid, Serum: 6.4 mg/dL (ref 2.4–7.0)

## 2019-02-05 NOTE — Progress Notes (Signed)
Chief Complaint  Patient presents with  . Follow-up  . Medication Management    HPI: Rebecca Mcintyre 73 y.o. come in for Chronic disease management  And FU  Cards  Said" decrease salt and wont need the lasix."  Now taking 40 mg  Every other day  At this time.  Seems to be helping.   Not on rehab day  takin potassium on lasix days   Is weighting qd    Allopurinol  Increased to 200 mg per day   About a month ago   Tolerating and needs uric acid repeated     Pulm  Doing much better     o2 if get below 90 and   Work out 3 and hoe a 2 .  Mostly in evening. Can decrease. Doing rehab and feels good about this and getting her strength back  To se dr Angus Palms team    asksa about glucometer  As has a coupon  Not sure which one   hld due for check soon   ROS: See pertinent positives and negatives per HPI. No current cough  New sob    Past Medical History:  Diagnosis Date  . Allergy   . ASCUS on Pap smear    had repeat x 2 normal with [name] Plan 2011  . Coronary artery disease   . Gout   . Hyperlipidemia   . Hypertension   . Myocardial infarction [facility])    2002 95% proximal LAD stenosis with thrombus followed by 90% stenosis, the first diagonal 80% stenosis, circumflex 30% stenosis, circumflex obtuse marginal subbranch 70% stenosis, PDA 40% stenosis. She had a drug-eluting stent placement with a Pixel stent  . Osteopenia   . Solitary kidney     Family History  Problem Relation Age of Onset  . Breast cancer Mother 93  . Lung cancer Father   . Heart attack Brother        Died age 21 MI  . Colon cancer Brother   . Heart disease Daughter 57       smoker and overweight   . Esophageal cancer Neg Hx   . Pancreatic cancer Neg Hx   . Rectal cancer Neg Hx   . Stomach cancer Neg Hx     Social History   Socioeconomic History  . Marital status: Single    Spouse name: Not on file  . Number of children: 2  . Years of education: Not on file  . Highest education level: Not on file   Occupational History    Employer: [employer]  . Financial resource strain: Not on file  . Food insecurity    Worry: Not on file    Inability: Not on file  . Transportation needs    Medical: Not on file    Non-medical: Not on file  Tobacco Use  . Smoking status: Former Smoker    Packs/day: 1.00    Years: 39.00    Pack years: 39.00    Types: Cigarettes    Quit date: 11/04/2018    Years since quitting: 0.2  . Smokeless tobacco: Never Used  . Tobacco comment: 39  Substance and Sexual Activity  . Alcohol use: Yes    Comment: 1-2 per month  . Drug use: No  . Sexual activity: Not on file  Lifestyle  . Physical activity    Days per week: Not on file    Minutes per session: Not on file  . Stress: Not on  file  Relationships  . Social Herbalist on phone: Not on file    Gets together: Not on file    Attends religious service: Not on file    Active member of club or organization: Not on file    Attends meetings of clubs or organizations: Not on file    Relationship status: Not on file  Other Topics Concern  . 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 acetaminophen (TYLENOL) 500 MG tablet Take 1,000 mg by mouth every 6 (six) hours as needed for mild pain.    [name] albuterol (VENTOLIN HFA) 108 (90 Base) MCG/ACT inhaler Inhale 2 puffs into the lungs every 6 (six) hours as needed for wheezing or shortness of breath. 18 g 3  . allopurinol (ZYLOPRIM) 100 MG tablet TAKE 1 TABLET BY MOUTH EVERY DAY (Patient taking differently: Pt takes 265m daily) 90 tablet 1  . aspirin 81 MG tablet Take 81 mg by mouth daily.    . furosemide (LASIX) 40 MG tablet Take 1 tablet (40 mg total) by mouth daily. 60 tablet 0  . guaiFENesin (MUCINEX) 600 MG 12 hr tablet  Take by mouth 2 (two) times daily.    .Marland KitchenKLOR-CON M20 20 MEQ tablet TAKE 1 TABLET BY MOUTH EVERY DAY 30 tablet 0  . metFORMIN (GLUCOPHAGE) 500 MG tablet TAKE 1 TABLET (500 MG TOTAL) BY MOUTH 2 (TWO) TIMES DAILY WITH A MEAL. 180 tablet 1  . metoprolol tartrate (LOPRESSOR) 25 MG tablet TAKE 1/2 TABLETS BY MOUTH 2 TIMES DAILY 90 tablet 1  . montelukast (SINGULAIR) 10 MG tablet TAKE 1 TABLET (10 MG TOTAL) AT BEDTIME BY MOUTH. 90 tablet 0  . rOPINIRole (REQUIP) 0.5 MG tablet Start 1/2 tab qhs x 2 nights; then increased to 1 tab qhs for restless leg 30 tablet 3  . simvastatin (ZOCOR) 40 MG tablet TAKE 1 TABLET BY MOUTH AT BEDTIME 90 tablet 1  . umeclidinium-vilanterol (ANORO ELLIPTA) 62.5-25 MCG/INH AEPB Inhale 1 puff into the lungs daily. 1 each 5  . nitroGLYCERIN (NITROSTAT) 0.4 MG SL tablet Place 1 tablet (0.4 mg total) under the tongue every 5 (five) minutes as needed. (Patient not taking: Reported on 02/05/2019) 30 tablet 0   No facility-administered medications prior to visit.      EXAM:  BP 120/72 (BP Location: Left Arm, Patient Position: Sitting, Cuff Size: Large)   Pulse 80   Temp (!) 97.5 F (36.4 C) (Temporal)   Resp 16   Ht _0  (1.6 m)   Wt 210 lb 3.2 oz (95.3 kg)   SpO2 96%   BMI 37.24 kg/m   Body mass index is 37.24 kg/m.  GENERAL: vitals reviewed and listed above, alert, oriented, appears well hydrated and in no acute distress nl resp effort  HEENT: atraumatic, conjunctiva  clear, no obvious abnormalities on inspection of external nose and ears OPmasked  NECK: no obvious masses on inspection palpation  LUNGS: clear to auscultation bilaterally, no wheezes, rales or rhonchi,dec bs but = no reps distress CV: HRRR, no clubbing cyanosis or  Slight  Puffy feet  But no sig edema  nl cap refill  MS: moves all extremities without noticeable  focal  abnormality PSYCH: pleasant and cooperative, no obvious depression or anxiety Lab Results  Component Value Date   WBC 8.8 01/01/2019    HGB 14.1 01/01/2019   HCT 42.5 01/01/2019   PLT 317 01/01/2019   GLUCOSE 102 (H) 12/29/2018   CHOL 142 03/10/2018   TRIG 137.0 03/10/2018   HDL 40.60 03/10/2018   LDLCALC 74 03/10/2018   ALT 34 11/12/2018   AST 31 11/12/2018   NA 137 12/29/2018   K 3.9 12/29/2018   CL 92 (L) 12/29/2018   CREATININE 0.79 12/29/2018   BUN 22 12/29/2018   CO2 34 (H) 12/29/2018   TSH 1.266 11/05/2018   HGBA1C 7.0 (H) 11/03/2018   MICROALBUR 1.1 03/10/2017   BP Readings from Last 3 Encounters:  02/05/19 120/72  01/29/19 (!) 176/78  01/28/19 (!) 130/50    ASSESSMENT AND PLAN:  Discussed the following assessment and plan:  Hyperuricemia - Plan: Lipid panel, Hemoglobin A1c, Uric Acid, Basic metabolic panel  Type 2 diabetes mellitus without complication, without long-term current use of insulin (HCC) - Plan: Lipid panel, Hemoglobin A1c, Uric Acid, Basic metabolic panel  Medication management - Plan: Lipid panel, Hemoglobin A1c, Uric Acid, Basic metabolic panel  Pulmonary emphysema, unspecified emphysema type (HCC) - Plan: Lipid panel, Hemoglobin A1c, Uric Acid, Basic metabolic panel  Hyperlipidemia, unspecified hyperlipidemia type To get flu vaccine  With family soon    Much improved  To read labels  Check labs today  And keep  PV appt in November   Lab and monitoring today  Can get tdap at  pharmacy -Patient advised to return or notify health care team  if  new concerns arise. In interim  Return for when planned, depending on labs.   Patient Instructions  Glad you are doing better   Will notify you  of labs when available.     Agree with daly weights and check the sodium content of  The foods   Can be tricky!Standley Brooking. Panosh M.D.

## 2019-02-05 NOTE — Patient Instructions (Signed)
Glad you are doing better   Will notify you  of labs when available.     Agree with daly weights and check the sodium content of  The foods   Can be tricky![name]

## 2019-02-09 ENCOUNTER — Other Ambulatory Visit: Payer: Self-pay

## 2019-02-09 ENCOUNTER — Encounter: Payer: Self-pay | Admitting: Podiatry

## 2019-02-09 ENCOUNTER — Encounter (HOSPITAL_COMMUNITY)
Admission: RE | Admit: 2019-02-09 | Discharge: 2019-02-09 | Disposition: A | Discharge status: Home / Self Care | Payer: Medicare Other | Source: Ambulatory Visit | Attending: Pulmonary Disease | Admitting: Pulmonary Disease

## 2019-02-09 DIAGNOSIS — J449 Chronic obstructive pulmonary disease, unspecified: Secondary | ICD-10-CM

## 2019-02-09 NOTE — Progress Notes (Signed)
I have reviewed a Home Exercise Prescription with [name] . Merrilyn is not currently exercising at home.  The patient was advised to walk 2 days a week for 30-45 minutes.  Zigmund Daniel and I discussed how to progress their exercise prescription.  The patient stated that their goals were to lose weight and breathe better.  The patient stated that they understand the exercise prescription.  We reviewed exercise guidelines, target heart rate during exercise, RPE Scale, weather conditions, NTG use, endpoints for exercise, warmup and cool down.  Patient is encouraged to come to me with any questions. I will continue to follow up with the patient to assist them with progression and safety.    Hoy Register, M.S., ACSM CEP

## 2019-02-09 NOTE — Progress Notes (Signed)
Daily Session Note  Patient Details  Name: Rebecca Mcintyre MRN: [id number] Date of Birth: January 24, 1946 Referring Provider:     Pulmonary Rehab Walk Test from 01/28/2019 in [place]  Referring Provider  Dr. [name]      Encounter Date: 02/09/2019  Check In: Session Check In - 02/09/19 0955      Check-In   Physician(s)  Dr. Benny Lennert    Location  [facility]    Staff Present  Rosebud Poles, RN, Bjorn Loser, MS, Exercise Physiologist;Lisa Ysidro Evert, RN    Virtual Visit  No    Medication changes reported      No    Fall or balance concerns reported     No    Tobacco Cessation  No Change    Warm-up and Cool-down  Performed as group-led instruction    Resistance Training Performed  No    VAD Patient?  No    PAD/SET Patient?  No      Pain Assessment   Currently in Pain?  No/denies    Multiple Pain Sites  No       Capillary Blood Glucose: No results found for this or any previous visit (from the past 24 hour(s)).  Exercise Prescription Changes - 02/09/19 1200      Home Exercise Plan   Plans to continue exercise at  Home (comment)    Frequency  Add 2 additional days to program exercise sessions.    Initial Home Exercises Provided  02/09/19       Social History   Tobacco Use  Smoking Status Former Smoker  . Packs/day: 1.00  . Years: 39.00  . Pack years: 39.00  . Types: Cigarettes  . Quit date: 11/04/2018  . Years since quitting: 0.2  Smokeless Tobacco Never Used  Tobacco Comment   39    Goals Met:  Exercise tolerated well Personal goals reviewed  Goals Unmet:  Not Applicable  Comments: Service time is from 0956 to 17    Dr. Rush Farmer is Medical Director for Pulmonary Rehab at [facility].

## 2019-02-11 ENCOUNTER — Encounter (HOSPITAL_COMMUNITY): Payer: Medicare Other

## 2019-02-12 DIAGNOSIS — J449 Chronic obstructive pulmonary disease, unspecified: Secondary | ICD-10-CM | POA: Diagnosis not present

## 2019-02-12 DIAGNOSIS — I5033 Acute on chronic diastolic (congestive) heart failure: Secondary | ICD-10-CM | POA: Diagnosis not present

## 2019-02-16 ENCOUNTER — Other Ambulatory Visit: Payer: Self-pay

## 2019-02-16 ENCOUNTER — Encounter (HOSPITAL_COMMUNITY)
Admission: RE | Admit: 2019-02-16 | Discharge: 2019-02-16 | Disposition: A | Discharge status: Home / Self Care | Payer: Medicare Other | Source: Ambulatory Visit | Attending: Pulmonary Disease | Admitting: Pulmonary Disease

## 2019-02-16 VITALS — Wt 211.9 lb

## 2019-02-16 DIAGNOSIS — J449 Chronic obstructive pulmonary disease, unspecified: Secondary | ICD-10-CM

## 2019-02-16 NOTE — Progress Notes (Signed)
Pulmonary Individual Treatment Plan  Patient Details  Name: Rebecca Mcintyre MRN: [id number] Date of Birth: July 18, 1945 Referring Provider:     Pulmonary Rehab Walk Test from 01/28/2019 in [place]  Referring Provider  Dr. [name]      Initial Encounter Date:    Pulmonary Rehab Walk Test from 01/28/2019 in [place]  Date  01/28/19      Visit Diagnosis: Chronic obstructive pulmonary disease, unspecified COPD type ([place])  Patient's Home Medications on Admission:   Current Outpatient Medications:  .  acetaminophen (TYLENOL) 500 MG tablet, Take 1,000 mg by mouth every 6 (six) hours as needed for mild pain., Disp: , Rfl:  .  albuterol (VENTOLIN HFA) 108 (90 Base) MCG/ACT inhaler, Inhale 2 puffs into the lungs every 6 (six) hours as needed for wheezing or shortness of breath., Disp: 18 g, Rfl: 3 .  allopurinol (ZYLOPRIM) 100 MG tablet, TAKE 1 TABLET BY MOUTH EVERY DAY (Patient taking differently: Pt takes 256m daily), Disp: 90 tablet, Rfl: 1 .  aspirin 81 MG tablet, Take 81 mg by mouth daily., Disp: , Rfl:  .  furosemide (LASIX) 40 MG tablet, Take 1 tablet (40 mg total) by mouth daily., Disp: 60 tablet, Rfl: 0 .  guaiFENesin (MUCINEX) 600 MG 12 hr tablet, Take by mouth 2 (two) times daily., Disp: , Rfl:  .  KLOR-CON M20 20 MEQ tablet, TAKE 1 TABLET BY MOUTH EVERY DAY, Disp: 30 tablet, Rfl: 0 .  metFORMIN (GLUCOPHAGE) 500 MG tablet, TAKE 1 TABLET (500 MG TOTAL) BY MOUTH 2 (TWO) TIMES DAILY WITH A MEAL., Disp: 180 tablet, Rfl: 1 .  metoprolol tartrate (LOPRESSOR) 25 MG tablet, TAKE 1/2 TABLETS BY MOUTH 2 TIMES DAILY, Disp: 90 tablet, Rfl: 1 .  montelukast (SINGULAIR) 10 MG tablet, TAKE 1 TABLET (10 MG TOTAL) AT BEDTIME BY MOUTH., Disp: 90 tablet, Rfl: 0 .  nitroGLYCERIN (NITROSTAT) 0.4 MG SL tablet, Place 1 tablet (0.4 mg total) under the tongue every 5 (five) minutes as needed. (Patient not taking: Reported on 02/05/2019), Disp: 30  tablet, Rfl: 0 .  rOPINIRole (REQUIP) 0.5 MG tablet, Start 1/2 tab qhs x 2 nights; then increased to 1 tab qhs for restless leg, Disp: 30 tablet, Rfl: 3 .  simvastatin (ZOCOR) 40 MG tablet, TAKE 1 TABLET BY MOUTH AT BEDTIME, Disp: 90 tablet, Rfl: 1 .  umeclidinium-vilanterol (ANORO ELLIPTA) 62.5-25 MCG/INH AEPB, Inhale 1 puff into the lungs daily., Disp: 1 each, Rfl: 5  Past Medical History: Past Medical History:  Diagnosis Date  . Allergy   . ASCUS on Pap smear    had repeat x 2 normal with Dr. RMarvel Plan2011  . Coronary artery disease   . Gout   . Hyperlipidemia   . Hypertension   . Myocardial infarction ([facility]    2002 95% proximal LAD stenosis with thrombus followed by 90% stenosis, the first diagonal 80% stenosis, circumflex 30% stenosis, circumflex obtuse marginal subbranch 70% stenosis, PDA 40% stenosis. She had a drug-eluting stent placement with a Pixel stent  . Osteopenia   . Solitary kidney     Tobacco Use: Social History   Tobacco Use  Smoking Status Former Smoker  . Packs/day: 1.00  . Years: 39.00  . Pack years: 39.00  . Types: Cigarettes  . Quit date: 11/04/2018  . Years since quitting: 0.2  Smokeless Tobacco Never Used  Tobacco Comment   39    Labs: Recent Review Flowsheet Data  Labs for ITP Cardiac and Pulmonary Rehab Latest Ref Rng & Units 11/03/2018 11/05/2018 11/05/2018 11/05/2018 02/05/2019   Cholestrol 0 - 200 mg/dL - - - - 185   LDLCALC 0 - 99 mg/dL - - - - -   LDLDIRECT mg/dL - - - - 116.0   HDL >39.00 mg/dL - - - - 47.00   Trlycerides 0.0 - 149.0 mg/dL - - - - 226.0(H)   Hemoglobin A1c 4.6 - 6.5 % 7.0(H) - - - 6.7(H)   PHART 7.350 - 7.450 - 7.278(L) 7.318(L) 7.407 -   PCO2ART 32.0 - 48.0 mmHg - 85.1(HH) 81.6(HH) 66.6(HH) -   HCO3 20.0 - 28.0 mmol/L - 38.5(H) 40.7(H) 41.2(H) -   O2SAT % - 99.0 92.9 90.7 -      Capillary Blood Glucose: Lab Results  Component Value Date   GLUCAP 119 (H) 11/12/2018   GLUCAP 120 (H) 11/12/2018   GLUCAP 132 (H)  11/11/2018   GLUCAP 106 (H) 11/11/2018   GLUCAP 124 (H) 11/11/2018   POCT Glucose    Row Name 02/02/19 1153             POCT Blood Glucose   Pre-Exercise  102 mg/dL       Post-Exercise  102 mg/dL          Pulmonary Assessment Scores: Pulmonary Assessment Scores    Row Name 01/28/19 1541         ADL UCSD   ADL Phase  Entry     SOB Score total  60       CAT Score   CAT Score  pre 11       UCSD: Self-administered rating of dyspnea associated with activities of daily living (ADLs) 6-point scale (0 = "not at all" to 5 = "maximal or unable to do because of breathlessness")  Scoring Scores range from 0 to 120.  Minimally important difference is 5 units  CAT: CAT can identify the health impairment of COPD patients and is better correlated with disease progression.  CAT has a scoring range of zero to 40. The CAT score is classified into four groups of low (less than 10), medium (10 - 20), high (21-30) and very high (31-40) based on the impact level of disease on health status. A CAT score over 10 suggests significant symptoms.  A worsening CAT score could be explained by an exacerbation, poor medication adherence, poor inhaler technique, or progression of COPD or comorbid conditions.  CAT MCID is 2 points  mMRC: mMRC (Modified Medical Research Council) Dyspnea Scale is used to assess the degree of baseline functional disability in patients of respiratory disease due to dyspnea. No minimal important difference is established. A decrease in score of 1 point or greater is considered a positive change.   Pulmonary Function Assessment: Pulmonary Function Assessment - 01/28/19 1509      Breath   Bilateral Breath Sounds  Clear    Shortness of Breath  Yes       Exercise Target Goals: Exercise Program Goal: Individual exercise prescription set using results from initial 6 min walk test and THRR while considering  patient's activity barriers and safety.   Exercise Prescription  Goal: Initial exercise prescription builds to 30-45 minutes a day of aerobic activity, 2-3 days per week.  Home exercise guidelines will be given to patient during program as part of exercise prescription that the participant will acknowledge.  Activity Barriers & Risk Stratification: Activity Barriers & Cardiac Risk Stratification - 01/28/19 1505  Activity Barriers & Cardiac Risk Stratification   Activity Barriers  Deconditioning;Muscular Weakness;Shortness of Breath       6 Minute Walk: 6 Minute Walk    Row Name 01/28/19 1602         6 Minute Walk   Phase  Initial     Distance  600 feet     Walk Time  4.5 minutes     # of Rest Breaks  1     MPH  1.13     METS  1.84     RPE  14     Perceived Dyspnea   2     VO2 Peak  5.2     Symptoms  Yes (comment)     Comments  1.5 minute standing break due to desaturation while on RA. Oxygen administered at 2L and saturations dropped again. Increased to 3 L and saturations were steady at 86-87     Resting HR  78 bpm     Resting BP  114/72     Resting Oxygen Saturation   91 %     Exercise Oxygen Saturation  during 6 min walk  80 %     Max Ex. HR  121 bpm     Max Ex. BP  164/84     2 Minute Post BP  122/68       Interval HR   1 Minute HR  121     2 Minute HR  92     3 Minute HR  86     4 Minute HR  97     5 Minute HR  102     6 Minute HR  107     2 Minute Post HR  82     Interval Heart Rate?  Yes       Interval Oxygen   Interval Oxygen?  Yes     Baseline Oxygen Saturation %  91 %     1 Minute Oxygen Saturation %  84 %     1 Minute Liters of Oxygen  0 L     2 Minute Oxygen Saturation %  80 %     2 Minute Liters of Oxygen  2 L     3 Minute Oxygen Saturation %  90 %     3 Minute Liters of Oxygen  2 L     4 Minute Oxygen Saturation %  85 %     4 Minute Liters of Oxygen  2 L     5 Minute Oxygen Saturation %  87 %     5 Minute Liters of Oxygen  3 L     6 Minute Oxygen Saturation %  86 %     6 Minute Liters of Oxygen  3 L      2 Minute Post Oxygen Saturation %  96 %     2 Minute Post Liters of Oxygen  1 L        Oxygen Initial Assessment: Oxygen Initial Assessment - 01/28/19 1601      Home Oxygen   Home Oxygen Device  Portable Concentrator;Home Concentrator;E-Tanks    Sleep Oxygen Prescription  Continuous    Liters per minute  2    Home Exercise Oxygen Prescription  Continuous    Liters per minute  1    Home at Rest Exercise Oxygen Prescription  Continuous    Liters per minute  1    Compliance with Home Oxygen Use  Yes  Initial 6 min Walk   Oxygen Used  Continuous    Liters per minute  3      Program Oxygen Prescription   Program Oxygen Prescription  Continuous    Liters per minute  3    Comments  May have to go up to 4 L      Intervention   Short Term Goals  To learn and exhibit compliance with exercise, home and travel O2 prescription;To learn and understand importance of monitoring SPO2 with pulse oximeter and demonstrate accurate use of the pulse oximeter.;To learn and understand importance of maintaining oxygen saturations>88%;To learn and demonstrate proper pursed lip breathing techniques or other breathing techniques.;To learn and demonstrate proper use of respiratory medications    Long  Term Goals  Exhibits compliance with exercise, home and travel O2 prescription;Verbalizes importance of monitoring SPO2 with pulse oximeter and return demonstration;Maintenance of O2 saturations>88%;Exhibits proper breathing techniques, such as pursed lip breathing or other method taught during program session;Compliance with respiratory medication       Oxygen Re-Evaluation: Oxygen Re-Evaluation    Row Name 02/15/19 0910             Program Oxygen Prescription   Program Oxygen Prescription  Continuous       Liters per minute  3         Home Oxygen   Home Oxygen Device  Portable Concentrator;Home Concentrator;E-Tanks       Sleep Oxygen Prescription  Continuous       Liters per minute  2        Home Exercise Oxygen Prescription  Continuous       Liters per minute  3       Home at Rest Exercise Oxygen Prescription  Continuous       Liters per minute  1       Compliance with Home Oxygen Use  Yes         Goals/Expected Outcomes   Short Term Goals  To learn and exhibit compliance with exercise, home and travel O2 prescription;To learn and understand importance of monitoring SPO2 with pulse oximeter and demonstrate accurate use of the pulse oximeter.;To learn and understand importance of maintaining oxygen saturations>88%;To learn and demonstrate proper pursed lip breathing techniques or other breathing techniques.;To learn and demonstrate proper use of respiratory medications       Long  Term Goals  Exhibits compliance with exercise, home and travel O2 prescription;Verbalizes importance of monitoring SPO2 with pulse oximeter and return demonstration;Maintenance of O2 saturations>88%;Exhibits proper breathing techniques, such as pursed lip breathing or other method taught during program session;Compliance with respiratory medication       Goals/Expected Outcomes  compliance          Oxygen Discharge (Final Oxygen Re-Evaluation): Oxygen Re-Evaluation - 02/15/19 0910      Program Oxygen Prescription   Program Oxygen Prescription  Continuous    Liters per minute  3      Home Oxygen   Home Oxygen Device  Portable Concentrator;Home Concentrator;E-Tanks    Sleep Oxygen Prescription  Continuous    Liters per minute  2    Home Exercise Oxygen Prescription  Continuous    Liters per minute  3    Home at Rest Exercise Oxygen Prescription  Continuous    Liters per minute  1    Compliance with Home Oxygen Use  Yes      Goals/Expected Outcomes   Short Term Goals  To learn and exhibit compliance with exercise,  home and travel O2 prescription;To learn and understand importance of monitoring SPO2 with pulse oximeter and demonstrate accurate use of the pulse oximeter.;To learn and understand  importance of maintaining oxygen saturations>88%;To learn and demonstrate proper pursed lip breathing techniques or other breathing techniques.;To learn and demonstrate proper use of respiratory medications    Long  Term Goals  Exhibits compliance with exercise, home and travel O2 prescription;Verbalizes importance of monitoring SPO2 with pulse oximeter and return demonstration;Maintenance of O2 saturations>88%;Exhibits proper breathing techniques, such as pursed lip breathing or other method taught during program session;Compliance with respiratory medication    Goals/Expected Outcomes  compliance       Initial Exercise Prescription: Initial Exercise Prescription - 01/28/19 1600      Date of Initial Exercise RX and Referring Provider   Date  01/28/19    Referring Provider  Dr. [name]      Oxygen   Oxygen  Continuous    Liters  3      Recumbant Bike   Level  1    Minutes  15      NuStep   Level  2    SPM  80    Minutes  15      Prescription Details   Frequency (times per week)  2    Duration  Progress to 30 minutes of continuous aerobic without signs/symptoms of physical distress      Intensity   THRR 40-80% of Max Heartrate  59-118    Ratings of Perceived Exertion  11-13    Perceived Dyspnea  0-4      Progression   Progression  Continue to progress workloads to maintain intensity without signs/symptoms of physical distress.      Resistance Training   Training Prescription  Yes    Weight  orange bands    Reps  10-15       Perform Capillary Blood Glucose checks as needed.  Exercise Prescription Changes: Exercise Prescription Changes    Row Name 02/02/19 1100 02/09/19 1200           Response to Exercise   Blood Pressure (Admit)  130/62  -      Blood Pressure (Exercise)  152/78  -      Blood Pressure (Exit)  102/60  -      Heart Rate (Admit)  81 bpm  -      Heart Rate (Exercise)  98 bpm  -      Heart Rate (Exit)  88 bpm  -      Oxygen Saturation (Admit)  97 %   -      Oxygen Saturation (Exercise)  91 %  -      Oxygen Saturation (Exit)  95 %  -      Rating of Perceived Exertion (Exercise)  14  -      Perceived Dyspnea (Exercise)  3  -      Duration  Continue with 30 min of aerobic exercise without signs/symptoms of physical distress.  -      Intensity  THRR unchanged  -        Progression   Progression  Continue to progress workloads to maintain intensity without signs/symptoms of physical distress.  -        Resistance Training   Training Prescription  Yes  -      Weight  orange bands  -      Reps  10-15  -  Recumbant Bike   Level  1  -      Minutes  15  -        NuStep   Level  2  -      SPM  80  -      Minutes  15  -        Home Exercise Plan   Plans to continue exercise at  -  Home (comment)      Frequency  -  Add 2 additional days to program exercise sessions.      Initial Home Exercises Provided  -  02/09/19         Exercise Comments: Exercise Comments    Row Name 02/09/19 1203           Exercise Comments  home exercise complete          Exercise Goals and Review: Exercise Goals    Row Name 01/28/19 1613 02/15/19 0912           Exercise Goals   Increase Physical Activity  Yes  Yes      Intervention  Provide advice, education, support and counseling about physical activity/exercise needs.;Develop an individualized exercise prescription for aerobic and resistive training based on initial evaluation findings, risk stratification, comorbidities and participant's personal goals.  Provide advice, education, support and counseling about physical activity/exercise needs.;Develop an individualized exercise prescription for aerobic and resistive training based on initial evaluation findings, risk stratification, comorbidities and participant's personal goals.      Expected Outcomes  Short Term: Attend rehab on a regular basis to increase amount of physical activity.;Long Term: Add in home exercise to make exercise part  of routine and to increase amount of physical activity.;Long Term: Exercising regularly at least 3-5 days a week.  Short Term: Attend rehab on a regular basis to increase amount of physical activity.;Long Term: Add in home exercise to make exercise part of routine and to increase amount of physical activity.;Long Term: Exercising regularly at least 3-5 days a week.      Increase Strength and Stamina  Yes  Yes      Intervention  Provide advice, education, support and counseling about physical activity/exercise needs.;Develop an individualized exercise prescription for aerobic and resistive training based on initial evaluation findings, risk stratification, comorbidities and participant's personal goals.  Provide advice, education, support and counseling about physical activity/exercise needs.;Develop an individualized exercise prescription for aerobic and resistive training based on initial evaluation findings, risk stratification, comorbidities and participant's personal goals.      Expected Outcomes  Short Term: Increase workloads from initial exercise prescription for resistance, speed, and METs.;Short Term: Perform resistance training exercises routinely during rehab and add in resistance training at home;Long Term: Improve cardiorespiratory fitness, muscular endurance and strength as measured by increased METs and functional capacity (6MWT)  Short Term: Increase workloads from initial exercise prescription for resistance, speed, and METs.;Short Term: Perform resistance training exercises routinely during rehab and add in resistance training at home;Long Term: Improve cardiorespiratory fitness, muscular endurance and strength as measured by increased METs and functional capacity (6MWT)      Able to understand and use rate of perceived exertion (RPE) scale  Yes  Yes      Intervention  Provide education and explanation on how to use RPE scale  Provide education and explanation on how to use RPE scale       Expected Outcomes  Short Term: Able to use RPE daily in rehab to express subjective intensity  level;Long Term:  Able to use RPE to guide intensity level when exercising independently  Short Term: Able to use RPE daily in rehab to express subjective intensity level;Long Term:  Able to use RPE to guide intensity level when exercising independently      Able to understand and use Dyspnea scale  Yes  Yes      Intervention  Provide education and explanation on how to use Dyspnea scale  Provide education and explanation on how to use Dyspnea scale      Expected Outcomes  Short Term: Able to use Dyspnea scale daily in rehab to express subjective sense of shortness of breath during exertion;Long Term: Able to use Dyspnea scale to guide intensity level when exercising independently  Short Term: Able to use Dyspnea scale daily in rehab to express subjective sense of shortness of breath during exertion;Long Term: Able to use Dyspnea scale to guide intensity level when exercising independently      Knowledge and understanding of Target Heart Rate Range (THRR)  Yes  Yes      Expected Outcomes  Short Term: Able to state/look up THRR;Short Term: Able to use daily as guideline for intensity in rehab;Long Term: Able to use THRR to govern intensity when exercising independently  Short Term: Able to state/look up THRR;Short Term: Able to use daily as guideline for intensity in rehab;Long Term: Able to use THRR to govern intensity when exercising independently      Understanding of Exercise Prescription  Yes  Yes      Intervention  Provide education, explanation, and written materials on patient's individual exercise prescription  Provide education, explanation, and written materials on patient's individual exercise prescription      Expected Outcomes  Short Term: Able to explain program exercise prescription;Long Term: Able to explain home exercise prescription to exercise independently  Short Term: Able to explain program  exercise prescription;Long Term: Able to explain home exercise prescription to exercise independently         Exercise Goals Re-Evaluation : Exercise Goals Re-Evaluation    [place] Name 02/15/19 0913             Exercise Goal Re-Evaluation   Exercise Goals Review  Increase Physical Activity;Increase Strength and Stamina;Able to understand and use rate of perceived exertion (RPE) scale;Able to understand and use Dyspnea scale;Knowledge and understanding of Target Heart Rate Range (THRR);Understanding of Exercise Prescription       Comments  Pt has completed 3 exercise sessions. Pt has a positive attitude and is motivated to adapt a more active lifestyle.Pt currently exercises at 2.4 METs on the stepper. Will continue to monitor and progress as able.       Expected Outcomes  Through exercise at rehab and at home, the patient will decrease shortness of breath with daily activities and feel confident in carrying out an exercise regime at home.          Discharge Exercise Prescription (Final Exercise Prescription Changes): Exercise Prescription Changes - 02/09/19 1200      Home Exercise Plan   Plans to continue exercise at  Home (comment)    Frequency  Add 2 additional days to program exercise sessions.    Initial Home Exercises Provided  02/09/19       Nutrition:  Target Goals: Understanding of nutrition guidelines, daily intake of sodium <157m, cholesterol <2061m calories 30% from fat and 7% or less from saturated fats, daily to have 5 or more servings of fruits and vegetables.  Biometrics: Pre  Biometrics - 01/28/19 1504      Pre Biometrics   Height  _0  (1.6 m)    Weight  95.3 kg    BMI (Calculated)  37.23    Grip Strength  32 kg        Nutrition Therapy Plan and Nutrition Goals:   Nutrition Assessments:   Nutrition Goals Re-Evaluation:   Nutrition Goals Discharge (Final Nutrition Goals Re-Evaluation):   Psychosocial: Target Goals: Acknowledge presence or  absence of significant depression and/or stress, maximize coping skills, provide positive support system. Participant is able to verbalize types and ability to use techniques and skills needed for reducing stress and depression.  Initial Review & Psychosocial Screening: Initial Psych Review & Screening - 01/28/19 1512      Initial Review   Current issues with  None Identified      Family Dynamics   Good Support System?  Yes      Barriers   Psychosocial barriers to participate in program  There are no identifiable barriers or psychosocial needs.      Screening Interventions   Interventions  Encouraged to exercise       Quality of Life Scores:  Scores of 19 and below usually indicate a poorer quality of life in these areas.  A difference of  2-3 points is a clinically meaningful difference.  A difference of 2-3 points in the total score of the Quality of Life Index has been associated with significant improvement in overall quality of life, self-image, physical symptoms, and general health in studies assessing change in quality of life.  PHQ-9: Recent Review Flowsheet Data    Depression screen [facility] 2/9 01/28/2019 03/10/2017 04/26/2016 04/27/2015 04/18/2014   Decreased Interest 0 0 0 0 0   Down, Depressed, Hopeless 0 0 0 0 0   PHQ - 2 Score 0 0 0 0 0   Altered sleeping 0 - - - -   Tired, decreased energy 0 - - - -   Change in appetite 0 - - - -   Feeling bad or failure about yourself  0 - - - -   Trouble concentrating 0 - - - -   Moving slowly or fidgety/restless 0 - - - -   Suicidal thoughts 0 - - - -   PHQ-9 Score 0 - - - -   Difficult doing work/chores Not difficult at all - - - -     Interpretation of Total Score  Total Score Depression Severity:  1-4 = Minimal depression, 5-9 = Mild depression, 10-14 = Moderate depression, 15-19 = Moderately severe depression, 20-27 = Severe depression   Psychosocial Evaluation and Intervention: Psychosocial Evaluation - 01/28/19 1512       Psychosocial Evaluation & Interventions   Interventions  Encouraged to exercise with the program and follow exercise prescription       Psychosocial Re-Evaluation: Psychosocial Re-Evaluation    Row Name 01/28/19 1513 02/15/19 1243           Psychosocial Re-Evaluation   Current issues with  None Identified  None Identified      Comments  -  no psychosocial concerns identified and no barriers to participation in pulmonary rehab      Expected Outcomes  -  No barriers to participation in pulmonary rehab      Interventions  Encouraged to attend Pulmonary Rehabilitation for the exercise  Encouraged to attend Pulmonary Rehabilitation for the exercise      Continue Psychosocial Services   No Follow  up required  No Follow up required         Psychosocial Discharge (Final Psychosocial Re-Evaluation): Psychosocial Re-Evaluation - 02/15/19 1243      Psychosocial Re-Evaluation   Current issues with  None Identified    Comments  no psychosocial concerns identified and no barriers to participation in pulmonary rehab    Expected Outcomes  No barriers to participation in pulmonary rehab    Interventions  Encouraged to attend Pulmonary Rehabilitation for the exercise    Continue Psychosocial Services   No Follow up required       Education: Education Goals: Education classes will be provided on a weekly basis, covering required topics. Participant will state understanding/return demonstration of topics presented.  Learning Barriers/Preferences:   Education Topics: Risk Factor Reduction:  -Group instruction that is supported by a PowerPoint presentation. Instructor discusses the definition of a risk factor, different risk factors for pulmonary disease, and how the heart and lungs work together.     Nutrition for Pulmonary Patient:  -Group instruction provided by PowerPoint slides, verbal discussion, and written materials to support subject matter. The instructor gives an explanation and  review of healthy diet recommendations, which includes a discussion on weight management, recommendations for fruit and vegetable consumption, as well as protein, fluid, caffeine, fiber, sodium, sugar, and alcohol. Tips for eating when patients are short of breath are discussed.   Pursed Lip Breathing:  -Group instruction that is supported by demonstration and informational handouts. Instructor discusses the benefits of pursed lip and diaphragmatic breathing and detailed demonstration on how to preform both.     Oxygen Safety:  -Group instruction provided by PowerPoint, verbal discussion, and written material to support subject matter. There is an overview of "What is Oxygen" and "Why do we need it".  Instructor also reviews how to create a safe environment for oxygen use, the importance of using oxygen as prescribed, and the risks of noncompliance. There is a brief discussion on traveling with oxygen and resources the patient may utilize.   Oxygen Equipment:  -Group instruction provided by [facility] Staff utilizing handouts, written materials, and equipment demonstrations.   Signs and Symptoms:  -Group instruction provided by written material and verbal discussion to support subject matter. Warning signs and symptoms of infection, stroke, and heart attack are reviewed and when to call the physician/911 reinforced. Tips for preventing the spread of infection discussed.   Advanced Directives:  -Group instruction provided by verbal instruction and written material to support subject matter. Instructor reviews Advanced Directive laws and proper instruction for filling out document.   Pulmonary Video:  -Group video education that reviews the importance of medication and oxygen compliance, exercise, good nutrition, pulmonary hygiene, and pursed lip and diaphragmatic breathing for the pulmonary patient.   Exercise for the Pulmonary Patient:  -Group instruction that is supported by a  PowerPoint presentation. Instructor discusses benefits of exercise, core components of exercise, frequency, duration, and intensity of an exercise routine, importance of utilizing pulse oximetry during exercise, safety while exercising, and options of places to exercise outside of rehab.     Pulmonary Medications:  -Verbally interactive group education provided by instructor with focus on inhaled medications and proper administration.   Anatomy and Physiology of the Respiratory System and Intimacy:  -Group instruction provided by PowerPoint, verbal discussion, and written material to support subject matter. Instructor reviews respiratory cycle and anatomical components of the respiratory system and their functions. Instructor also reviews differences in obstructive and restrictive respiratory diseases with  examples of each. Intimacy, Sex, and Sexuality differences are reviewed with a discussion on how relationships can change when diagnosed with pulmonary disease. Common sexual concerns are reviewed.   MD DAY -A group question and answer session with a medical doctor that allows participants to ask questions that relate to their pulmonary disease state.   OTHER EDUCATION -Group or individual verbal, written, or video instructions that support the educational goals of the pulmonary rehab program.   Holiday Eating Survival Tips:  -Group instruction provided by PowerPoint slides, verbal discussion, and written materials to support subject matter. The instructor gives patients tips, tricks, and techniques to help them not only survive but enjoy the holidays despite the onslaught of food that accompanies the holidays.   Knowledge Questionnaire Score: Knowledge Questionnaire Score - 01/28/19 1541      Knowledge Questionnaire Score   Pre Score  16/18       Core Components/Risk Factors/Patient Goals at Admission: Personal Goals and Risk Factors at Admission - 01/28/19 1542      Core  Components/Risk Factors/Patient Goals on Admission   Improve shortness of breath with ADL's  Yes       Core Components/Risk Factors/Patient Goals Review:  Goals and Risk Factor Review    Row Name 01/28/19 1542 02/15/19 1244           Core Components/Risk Factors/Patient Goals Review   Personal Goals Review  Increase knowledge of respiratory medications and ability to use respiratory devices properly.;Improve shortness of breath with ADL's;Develop more efficient breathing techniques such as purse lipped breathing and diaphragmatic breathing and practicing self-pacing with activity.  Increase knowledge of respiratory medications and ability to use respiratory devices properly.;Improve shortness of breath with ADL's;Develop more efficient breathing techniques such as purse lipped breathing and diaphragmatic breathing and practicing self-pacing with activity.      Review  -  Just began program, has attended 3 exercise sessions, is tolerating well, too early to see progressions to program goals.      Expected Outcomes  -  See admission goals         Core Components/Risk Factors/Patient Goals at Discharge (Final Review):  Goals and Risk Factor Review - 02/15/19 1244      Core Components/Risk Factors/Patient Goals Review   Personal Goals Review  Increase knowledge of respiratory medications and ability to use respiratory devices properly.;Improve shortness of breath with ADL's;Develop more efficient breathing techniques such as purse lipped breathing and diaphragmatic breathing and practicing self-pacing with activity.    Review  Just began program, has attended 3 exercise sessions, is tolerating well, too early to see progressions to program goals.    Expected Outcomes  See admission goals       ITP Comments:   Comments: ITP REVIEW Pt is making expected progress toward pulmonary rehab goals after completing 3 sessions. Recommend continued exercise, life style modification, education, and  utilization of breathing techniques to increase stamina and strength and decrease shortness of breath with exertion.

## 2019-02-16 NOTE — Progress Notes (Signed)
Daily Session Note  Patient Details  Name: Rebecca Mcintyre MRN: [id number] Date of Birth: 06-10-1945 Referring Provider:     Pulmonary Rehab Walk Test from 01/28/2019 in [place]  Referring Provider  Dr. [name]      Encounter Date: 02/16/2019  Check In: Session Check In - 02/16/19 1123      Check-In   Supervising physician immediately available to respond to emergencies  [facility] immediately available    Physician(s)  Dr. Maren Beach    Location  [facility]    Staff Present  Rosebud Poles, RN, Bjorn Loser, MS, Exercise Physiologist;Lisa Ysidro Evert, RN    Virtual Visit  No    Medication changes reported      No    Fall or balance concerns reported     No    Tobacco Cessation  No Change    Warm-up and Cool-down  Performed on first and last piece of equipment    Resistance Training Performed  Yes    VAD Patient?  No    PAD/SET Patient?  No      Pain Assessment   Currently in Pain?  No/denies    Multiple Pain Sites  No       Capillary Blood Glucose: No results found for this or any previous visit (from the past 24 hour(s)). POCT Glucose - 02/16/19 1143      POCT Blood Glucose   Pre-Exercise  110 mg/dL    Post-Exercise  117 mg/dL      Exercise Prescription Changes - 02/16/19 1100      Response to Exercise   Blood Pressure (Admit)  124/64    Blood Pressure (Exercise)  144/82    Blood Pressure (Exit)  120/66    Heart Rate (Admit)  85 bpm    Heart Rate (Exercise)  109 bpm    Heart Rate (Exit)  92 bpm    Oxygen Saturation (Admit)  93 %    Oxygen Saturation (Exercise)  89 %    Oxygen Saturation (Exit)  92 %    Rating of Perceived Exertion (Exercise)  13    Perceived Dyspnea (Exercise)  1    Duration  Continue with 30 min of aerobic exercise without signs/symptoms of physical distress.    Intensity  THRR unchanged      Progression   Progression  Continue to progress workloads to maintain intensity without  signs/symptoms of physical distress.      Resistance Training   Training Prescription  Yes    Reps  10-15    Time  10 Minutes      Oxygen   Oxygen  Continuous    Liters  3      Recumbant Bike   Level  1.5    Minutes  15      NuStep   Level  3    SPM  80    Minutes  15    METs  2.2       Social History   Tobacco Use  Smoking Status Former Smoker  . Packs/day: 1.00  . Years: 39.00  . Pack years: 39.00  . Types: Cigarettes  . Quit date: 11/04/2018  . Years since quitting: 0.2  Smokeless Tobacco Never Used  Tobacco Comment   39    Goals Met:  Proper associated with RPD/PD & O2 Sat Exercise tolerated well Strength training completed today  Goals Unmet:  Not Applicable  Comments: Service time is from [phone]  to 1100    Dr. Rush Farmer is Medical Director for Pulmonary Rehab at [facility].

## 2019-02-18 ENCOUNTER — Other Ambulatory Visit: Payer: Self-pay

## 2019-02-18 ENCOUNTER — Encounter (HOSPITAL_COMMUNITY)
Admission: RE | Admit: 2019-02-18 | Discharge: 2019-02-18 | Disposition: A | Discharge status: Home / Self Care | Payer: Medicare Other | Source: Ambulatory Visit | Attending: Pulmonary Disease | Admitting: Pulmonary Disease

## 2019-02-18 DIAGNOSIS — J449 Chronic obstructive pulmonary disease, unspecified: Secondary | ICD-10-CM | POA: Diagnosis not present

## 2019-02-18 NOTE — Progress Notes (Signed)
Daily Session Note  Patient Details  Name: Rebecca Mcintyre MRN: [id number] Date of Birth: 03/28/1946 Referring Provider:     Pulmonary Rehab Walk Test from 01/28/2019 in [place]  Referring Provider  Dr. [name]      Encounter Date: 02/18/2019  Check In: Session Check In - 02/18/19 1128      Check-In   Supervising physician immediately available to respond to emergencies  [facility] immediately available    Physician(s)  Dr. [place]    Location  [facility]    Staff Present  Rosebud Poles, RN, BSN;Carlette Wilber Oliphant, RN, Roque Cash, RN    Virtual Visit  No    Medication changes reported      No    Fall or balance concerns reported     No    Tobacco Cessation  No Change    Warm-up and Cool-down  Performed as group-led instruction    VAD Patient?  No    PAD/SET Patient?  No      Pain Assessment   Currently in Pain?  No/denies    Multiple Pain Sites  No       Capillary Blood Glucose: No results found for this or any previous visit (from the past 24 hour(s)).    Social History   Tobacco Use  Smoking Status Former Smoker  . Packs/day: 1.00  . Years: 39.00  . Pack years: 39.00  . Types: Cigarettes  . Quit date: 11/04/2018  . Years since quitting: 0.2  Smokeless Tobacco Never Used  Tobacco Comment   39    Goals Met:  Proper associated with RPD/PD & O2 Sat Exercise tolerated well Strength training completed today  Goals Unmet:  Not Applicable  Comments: Service time is from 0955 to [place]    Dr. Rush Farmer is Medical Director for Pulmonary Rehab at [facility].

## 2019-02-20 ENCOUNTER — Other Ambulatory Visit: Payer: Self-pay | Admitting: Internal Medicine

## 2019-02-23 ENCOUNTER — Encounter (HOSPITAL_COMMUNITY): Payer: Medicare Other

## 2019-02-25 ENCOUNTER — Encounter (HOSPITAL_COMMUNITY): Payer: Medicare Other

## 2019-02-25 ENCOUNTER — Telehealth (HOSPITAL_COMMUNITY): Payer: Self-pay | Admitting: *Deleted

## 2019-02-25 NOTE — Telephone Encounter (Signed)
Called to check on patient, she missed 2 exercises sessions, so forgot to call us, she is on vacation.

## 2019-03-02 ENCOUNTER — Other Ambulatory Visit: Payer: Self-pay | Admitting: Internal Medicine

## 2019-03-02 ENCOUNTER — Other Ambulatory Visit: Payer: Self-pay

## 2019-03-02 ENCOUNTER — Telehealth: Payer: Self-pay

## 2019-03-02 ENCOUNTER — Encounter (HOSPITAL_COMMUNITY)
Admission: RE | Admit: 2019-03-02 | Discharge: 2019-03-02 | Disposition: A | Discharge status: Home / Self Care | Payer: Medicare Other | Source: Ambulatory Visit | Attending: Pulmonary Disease | Admitting: Pulmonary Disease

## 2019-03-02 DIAGNOSIS — J449 Chronic obstructive pulmonary disease, unspecified: Secondary | ICD-10-CM | POA: Insufficient documentation

## 2019-03-02 MED ORDER — ALLOPURINOL 100 MG PO TABS: 100.0000 mg | ORAL_TABLET | Freq: Two times a day (BID) | ORAL | 1 refills | Status: DC

## 2019-03-02 NOTE — Telephone Encounter (Signed)
medicaiton has been sent in

## 2019-03-02 NOTE — Progress Notes (Signed)
Daily Session Note  Patient Details  Name: Rebecca Mcintyre MRN: [id number] Date of Birth: Jul 29, 1945 Referring Provider:     Pulmonary Rehab Walk Test from 01/28/2019 in [place]  Referring Provider  Dr. [name]      Encounter Date: 03/02/2019  Check In: Session Check In - 03/02/19 1112      Check-In   Supervising physician immediately available to respond to emergencies  [facility] immediately available    Physician(s)  Dr. Benny Lennert    Location  [facility]    Staff Present  Rosebud Poles, RN, Bjorn Loser, MS, Exercise Physiologist;Carlette Wilber Oliphant, RN, BSN    Virtual Visit  No    Medication changes reported      No    Fall or balance concerns reported     No    Tobacco Cessation  No Change    Warm-up and Cool-down  Performed as group-led instruction    Resistance Training Performed  Yes    VAD Patient?  No    PAD/SET Patient?  No      Pain Assessment   Currently in Pain?  No/denies    Multiple Pain Sites  No       Capillary Blood Glucose: No results found for this or any previous visit (from the past 24 hour(s)).  Exercise Prescription Changes - 03/02/19 1100      Response to Exercise   Blood Pressure (Admit)  120/66    Blood Pressure (Exercise)  126/72    Blood Pressure (Exit)  116/62    Heart Rate (Admit)  90 bpm    Heart Rate (Exercise)  94 bpm    Heart Rate (Exit)  88 bpm    Oxygen Saturation (Admit)  94 %    Oxygen Saturation (Exercise)  89 %    Oxygen Saturation (Exit)  95 %    Rating of Perceived Exertion (Exercise)  13    Perceived Dyspnea (Exercise)  2    Duration  Continue with 30 min of aerobic exercise without signs/symptoms of physical distress.    Intensity  THRR unchanged      Progression   Progression  Continue to progress workloads to maintain intensity without signs/symptoms of physical distress.      Resistance Training   Training Prescription  Yes    Reps  10-15    Time  10  Minutes      Oxygen   Oxygen  Continuous    Liters  3      Recumbant Bike   Level  1.5    Minutes  15      NuStep   Level  3    SPM  80    Minutes  15    METs  2.3       Social History   Tobacco Use  Smoking Status Former Smoker  . Packs/day: 1.00  . Years: 39.00  . Pack years: 39.00  . Types: Cigarettes  . Quit date: 11/04/2018  . Years since quitting: 0.3  Smokeless Tobacco Never Used  Tobacco Comment   39    Goals Met:  Proper associated with RPD/PD & O2 Sat Exercise tolerated well Strength training completed today  Goals Unmet:  Not Applicable  Comments: Service time is from 0950 to [place]    Dr. Rush Farmer is Medical Director for Pulmonary Rehab at [facility].

## 2019-03-02 NOTE — Telephone Encounter (Signed)
Allopurinol    I need you to call [facility] for a refill but please change prescription to 2 100 MG daily as we spoke about. Thanks Everleigh This was sent via mychart please advise if okay to change ?

## 2019-03-02 NOTE — Addendum Note (Signed)
Addended by: Modena Morrow R on: 03/02/2019 02:54 PM   Modules accepted: Orders

## 2019-03-02 NOTE — Telephone Encounter (Signed)
Yes please send in  90 days refill x 1   For dose requested

## 2019-03-03 ENCOUNTER — Ambulatory Visit: Payer: Medicare Other | Admitting: Pulmonary Disease

## 2019-03-03 ENCOUNTER — Encounter: Payer: Self-pay | Admitting: Pulmonary Disease

## 2019-03-03 DIAGNOSIS — J9611 Chronic respiratory failure with hypoxia: Secondary | ICD-10-CM | POA: Diagnosis not present

## 2019-03-03 DIAGNOSIS — R918 Other nonspecific abnormal finding of lung field: Secondary | ICD-10-CM

## 2019-03-03 DIAGNOSIS — J9612 Chronic respiratory failure with hypercapnia: Secondary | ICD-10-CM

## 2019-03-03 DIAGNOSIS — J432 Centrilobular emphysema: Secondary | ICD-10-CM | POA: Diagnosis not present

## 2019-03-03 NOTE — Assessment & Plan Note (Signed)
Congratulations on pulmonary rehab. Nocturnal oximetry on room air in November and decide about continued use of nocturnal oxygen.  Discussed signs and symptoms of hypercarbia Avoid sedating medications

## 2019-03-03 NOTE — Progress Notes (Signed)
Subjective:    Patient ID: Rebecca Mcintyre, female    DOB: May 22, 1946, 73 y.o.   MRN: [id number]  HPI  73 year old ex-smoker for follow-up of COPD on home oxygen   PMH - CAD, diastolic HF,  dyslipidemia, obesity   She was hospitalized 10/2018 for acute on chronic hypercarbic respiratory failure  Chief Complaint  Patient presents with  . COPD, moderate ([place])    Breathing has improved since last visit, using Anoro daily and Ventolin as needed. Participating in [place] rehab.    She has done well since discharge from the hospital, had 1 flareup which resolved with antibiotics. Anoro really helps with her breathing. 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 She is participating in pulmonary rehab and this is really helped.  She uses her oxygen on a as needed basis in the daytime and occasionally at night.  She is compliant with Lasix and still has pedal edema occasionally-reviewed cardiology evaluation   Reviewed sleep study and PFTs today She did have PLMS on her sleep study but does not have symptoms of restless leg syndrome  Flu shot is up-to-date   Significant tests/ events reviewed   PFTs 12/29/2018 FVC 2.31 (82), FEV1 1.20 (56), ratio 52, no bronchodilatory response, DLCO 51 %  Imaging 11/04/18 CTA Chest- No PE; cardiomegaly and emphysematous changes of the lung   NPSG 12/13/18  no obstructive sleep apnea, min O2 89%. Periodic limb movements did occur during sleep   Past Medical History:  Diagnosis Date  . Allergy   . ASCUS on Pap smear    had repeat x 2 normal with [name] Plan 2011  . Coronary artery disease   . Gout   . Hyperlipidemia   . Hypertension   . Myocardial infarction [facility])    2002 95% proximal LAD stenosis with thrombus followed by 90% stenosis, the first diagonal 80% stenosis, circumflex 30% stenosis, circumflex obtuse marginal subbranch 70% stenosis, PDA 40% stenosis. She had a drug-eluting stent placement  with a Pixel stent  . Osteopenia   . Solitary kidney      Review of Systems neg for any significant sore throat, dysphagia, itching, sneezing, nasal congestion or excess/ purulent secretions, fever, chills, sweats, unintended wt loss, pleuritic or exertional cp, hempoptysis, orthopnea pnd or change in chronic leg swelling. Also denies presyncope, palpitations, heartburn, abdominal pain, nausea, vomiting, diarrhea or change in bowel or urinary habits, dysuria,hematuria, rash, arthralgias, visual complaints, headache, numbness weakness or ataxia.     Objective:   Physical Exam  Gen. Pleasant, obese, in no distress, normal affect ENT - no pallor,icterus, no post nasal drip, class 2-3 airway Neck: No JVD, no thyromegaly, no carotid bruits Lungs: no use of accessory muscles, no dullness to percussion, decreased without rales or rhonchi  Cardiovascular: Rhythm regular, heart sounds  normal, no murmurs or gallops, 1+ peripheral edema Abdomen: soft and non-tender, no hepatosplenomegaly, BS normal. Musculoskeletal: No deformities, no cyanosis or clubbing Neuro:  alert, non focal, no tremors       Assessment & Plan:

## 2019-03-03 NOTE — Patient Instructions (Signed)
Takes Singulair only during spring and fall. Okay to stop taking Requip.  Stay on Anoro once daily  Congratulations on pulmonary rehab. Nocturnal oximetry on room air in November

## 2019-03-03 NOTE — Assessment & Plan Note (Signed)
Takes Singulair only during spring and fall. Okay to stop taking Requip.  Stay on Anoro once daily

## 2019-03-04 ENCOUNTER — Other Ambulatory Visit: Payer: Self-pay

## 2019-03-04 ENCOUNTER — Encounter (HOSPITAL_COMMUNITY)
Admission: RE | Admit: 2019-03-04 | Discharge: 2019-03-04 | Disposition: A | Discharge status: Home / Self Care | Payer: Medicare Other | Source: Ambulatory Visit | Attending: Pulmonary Disease | Admitting: Pulmonary Disease

## 2019-03-04 DIAGNOSIS — J449 Chronic obstructive pulmonary disease, unspecified: Secondary | ICD-10-CM | POA: Diagnosis not present

## 2019-03-04 NOTE — Progress Notes (Signed)
Daily Session Note  Patient Details  Name: Rebecca Mcintyre MRN: [id number] Date of Birth: [birth year] Referring Provider:     Pulmonary Rehab Walk Test from 01/28/2019 in [place]  Referring Provider  Dr. [name]      Encounter Date: 03/04/2019  Check In: Session Check In - 03/04/19 1052      Check-In   Supervising physician immediately available to respond to emergencies  [facility] immediately available    Physician(s)  Dr. Benny Lennert    Location  [facility]    Staff Present  Rosebud Poles, RN, BSN;Carlette Wilber Oliphant, RN, Bjorn Loser, MS, Exercise Physiologist    Virtual Visit  No    Medication changes reported      No    Fall or balance concerns reported     No    Tobacco Cessation  No Change    Warm-up and Cool-down  Performed as group-led instruction    Resistance Training Performed  Yes    VAD Patient?  No    PAD/SET Patient?  No      Pain Assessment   Currently in Pain?  No/denies    Multiple Pain Sites  No       Capillary Blood Glucose: No results found for this or any previous visit (from the past 24 hour(s)).    Social History   Tobacco Use  Smoking Status Former Smoker  . Packs/day: 1.00  . Years: 39.00  . Pack years: 39.00  . Types: Cigarettes  . Quit date: 11/04/2018  . Years since quitting: 0.3  Smokeless Tobacco Never Used  Tobacco Comment   39    Goals Met:  Proper associated with RPD/PD & O2 Sat Exercise tolerated well Strength training completed today  Goals Unmet:  Not Applicable  Comments: Service time is from 0955 to 1100    Dr. Rush Farmer is Medical Director for Pulmonary Rehab at [facility].

## 2019-03-09 ENCOUNTER — Other Ambulatory Visit: Payer: Self-pay

## 2019-03-09 ENCOUNTER — Encounter (HOSPITAL_COMMUNITY)
Admission: RE | Admit: 2019-03-09 | Discharge: 2019-03-09 | Disposition: A | Discharge status: Home / Self Care | Payer: Medicare Other | Source: Ambulatory Visit | Attending: Pulmonary Disease | Admitting: Pulmonary Disease

## 2019-03-09 DIAGNOSIS — J449 Chronic obstructive pulmonary disease, unspecified: Secondary | ICD-10-CM

## 2019-03-09 NOTE — Progress Notes (Signed)
Daily Session Note  Patient Details  Name: Rebecca Mcintyre MRN: [id number] Date of Birth: [birth year] Referring Provider:     Pulmonary Rehab Walk Test from 01/28/2019 in [place]  Referring Provider  Dr. [name]      Encounter Date: 03/09/2019  Check In: Session Check In - 03/09/19 1003      Check-In   Supervising physician immediately available to respond to emergencies  [facility] immediately available    Physician(s)  Dr. Doristine Bosworth    Location  [facility]    Staff Present  Rosebud Poles, RN, Bjorn Loser, MS, Exercise Physiologist;Lisa Ysidro Evert, RN    Virtual Visit  No    Medication changes reported      No    Fall or balance concerns reported     No    Tobacco Cessation  No Change    Warm-up and Cool-down  Performed as group-led instruction    Resistance Training Performed  Yes    VAD Patient?  No    PAD/SET Patient?  No      Pain Assessment   Currently in Pain?  No/denies    Multiple Pain Sites  No       Capillary Blood Glucose: No results found for this or any previous visit (from the past 24 hour(s)).    Social History   Tobacco Use  Smoking Status Former Smoker  . Packs/day: 1.00  . Years: 39.00  . Pack years: 39.00  . Types: Cigarettes  . Quit date: 11/04/2018  . Years since quitting: 0.3  Smokeless Tobacco Never Used  Tobacco Comment   39    Goals Met:  Proper associated with RPD/PD & O2 Sat Exercise tolerated well Strength training completed today  Goals Unmet:  Not Applicable  Comments: Service time is from 0950 to 1100    Dr. Rush Farmer is Medical Director for Pulmonary Rehab at [facility].

## 2019-03-10 NOTE — Telephone Encounter (Signed)
Need more information but if no fever serious  pain  But could have itching  itches  This could be intertrigo with yeast infection  Try otc antiyeast topical for 1-2 weeks  Such as lortimin  Or momiconazole  And keep as dry as possible   Do not use the neosporin  Then  Plan virtual or ov if not getting better in another 1-2 weeks

## 2019-03-11 ENCOUNTER — Other Ambulatory Visit: Payer: Self-pay

## 2019-03-11 ENCOUNTER — Encounter (HOSPITAL_COMMUNITY)
Admission: RE | Admit: 2019-03-11 | Discharge: 2019-03-11 | Disposition: A | Discharge status: Home / Self Care | Payer: Medicare Other | Source: Ambulatory Visit | Attending: Pulmonary Disease | Admitting: Pulmonary Disease

## 2019-03-11 DIAGNOSIS — J449 Chronic obstructive pulmonary disease, unspecified: Secondary | ICD-10-CM

## 2019-03-11 NOTE — Progress Notes (Signed)
Daily Session Note  Patient Details  Name: LORELAI HUYSER MRN: [id number] Date of Birth: 13-Jan-1946 Referring Provider:     Pulmonary Rehab Walk Test from 01/28/2019 in [place]  Referring Provider  Dr. [name]      Encounter Date: 03/11/2019  Check In: Session Check In - 03/11/19 1118      Check-In   Supervising physician immediately available to respond to emergencies  [facility] immediately available    Physician(s)  Dr. Doristine Bosworth    Location  [facility]    Staff Present  Hoy Register, MS, Exercise Physiologist;Lisa Colletta [place], RN, MHA    Virtual Visit  No    Medication changes reported      No    Fall or balance concerns reported     No    Tobacco Cessation  No Change    Warm-up and Cool-down  Performed on first and last piece of equipment    Resistance Training Performed  Yes    VAD Patient?  No    PAD/SET Patient?  No      Pain Assessment   Currently in Pain?  No/denies    Multiple Pain Sites  No       Capillary Blood Glucose: No results found for this or any previous visit (from the past 24 hour(s)).    Social History   Tobacco Use  Smoking Status Former Smoker  . Packs/day: 1.00  . Years: 39.00  . Pack years: 39.00  . Types: Cigarettes  . Quit date: 11/04/2018  . Years since quitting: 0.3  Smokeless Tobacco Never Used  Tobacco Comment   39    Goals Met:  Exercise tolerated well  Goals Unmet:  Not Applicable  Comments: Service time is from 0955 to 1100    Dr. Rush Farmer is Medical Director for Pulmonary Rehab at [facility].

## 2019-03-14 ENCOUNTER — Other Ambulatory Visit: Payer: Self-pay | Admitting: Internal Medicine

## 2019-03-14 DIAGNOSIS — J449 Chronic obstructive pulmonary disease, unspecified: Secondary | ICD-10-CM | POA: Diagnosis not present

## 2019-03-14 DIAGNOSIS — I5033 Acute on chronic diastolic (congestive) heart failure: Secondary | ICD-10-CM | POA: Diagnosis not present

## 2019-03-16 ENCOUNTER — Encounter (HOSPITAL_COMMUNITY)
Admission: RE | Admit: 2019-03-16 | Discharge: 2019-03-16 | Disposition: A | Discharge status: Home / Self Care | Payer: Medicare Other | Source: Ambulatory Visit | Attending: Pulmonary Disease | Admitting: Pulmonary Disease

## 2019-03-16 ENCOUNTER — Other Ambulatory Visit: Payer: Self-pay

## 2019-03-16 VITALS — Wt 214.7 lb

## 2019-03-16 DIAGNOSIS — J449 Chronic obstructive pulmonary disease, unspecified: Secondary | ICD-10-CM | POA: Diagnosis not present

## 2019-03-16 NOTE — Progress Notes (Addendum)
Daily Session Note  Patient Details  Name: Rebecca Mcintyre MRN: [id number] Date of Birth: 1945/10/18 Referring Provider:     Pulmonary Rehab Walk Test from 01/28/2019 in [place]  Referring Provider  Dr. [name]      Encounter Date: 03/16/2019  Check In: Session Check In - 03/16/19 1130      Check-In   Supervising physician immediately available to respond to emergencies  [facility] immediately available    Physician(s)  Dr. Doristine Bosworth    Location  [facility]    Staff Present  Hoy Register, MS, Exercise Physiologist;Lisa Ysidro Evert, RN;Malyn Aytes Rollene Rotunda, RN, BSN    Virtual Visit  No    Medication changes reported      No    Fall or balance concerns reported     No    Tobacco Cessation  No Change    Warm-up and Cool-down  Performed on first and last piece of equipment    Resistance Training Performed  Yes    VAD Patient?  No    PAD/SET Patient?  No      Pain Assessment   Currently in Pain?  No/denies    Multiple Pain Sites  No       Capillary Blood Glucose: No results found for this or any previous visit (from the past 24 hour(s)). POCT Glucose - 03/16/19 1138      POCT Blood Glucose   Pre-Exercise  124 mg/dL    Post-Exercise  107 mg/dL      Exercise Prescription Changes - 03/16/19 1100      Response to Exercise   Blood Pressure (Admit)  120/70  (Pended)     Blood Pressure (Exercise)  144/68  (Pended)     Blood Pressure (Exit)  100/68  (Pended)     Heart Rate (Admit)  81 bpm  (Pended)     Heart Rate (Exercise)  101 bpm  (Pended)     Heart Rate (Exit)  90 bpm  (Pended)     Oxygen Saturation (Admit)  97 %  (Pended)     Oxygen Saturation (Exercise)  88 %  (Pended)     Oxygen Saturation (Exit)  93 %  (Pended)     Rating of Perceived Exertion (Exercise)  13  (Pended)     Perceived Dyspnea (Exercise)  2  (Pended)     Duration  Continue with 30 min of aerobic exercise without signs/symptoms of physical distress.  (Pended)      Intensity  THRR unchanged  (Pended)       Progression   Progression  Continue to progress workloads to maintain intensity without signs/symptoms of physical distress.  (Pended)       Resistance Training   Training Prescription  Yes  (Pended)     Reps  10-15  (Pended)     Time  10 Minutes  (Pended)       Oxygen   Oxygen  Continuous  (Pended)     Liters  3  (Pended)       Recumbant Bike   Level  1.5  (Pended)     Minutes  15  (Pended)       NuStep   Level  4  (Pended)     SPM  80  (Pended)     Minutes  15  (Pended)     METs  2  (Pended)        Social History   Tobacco Use  Smoking  Status Former Smoker  . Packs/day: 1.00  . Years: 39.00  . Pack years: 39.00  . Types: Cigarettes  . Quit date: 11/04/2018  . Years since quitting: 0.3  Smokeless Tobacco Never Used  Tobacco Comment   39    Goals Met:  Proper associated with RPD/PD & O2 Sat Improved SOB with ADL's Using PLB without cueing & demonstrates good technique Exercise tolerated well Strength training completed today  Goals Unmet:  Not Applicable  Comments: Service time is from 0955 to 1100    Dr. Rush Farmer is Medical Director for Pulmonary Rehab at [facility].

## 2019-03-16 NOTE — Progress Notes (Signed)
Spoke with pt today and she requests information about a low sodium diet. Reviewed the benefits of maintaining a low sodium diet. Showed pt how to read labels utilizing nutrition content claims and nutrition information. Recommended pt eat 1500 mg sodium per day. Pt eats 3 meals/day and 1 snack/day. Recommended 400-450 mg sodium per meal and 200-300 mg sodium per snack. Discussed the importance of choosing low sodium products such as fresh/frozen fruits and vegetables and limiting processed meats, highly processed foods, and restaurant foods. Reviewed budget friendly food options (ie reading labels to select canned beans, vegetables, frozen meals). Reviewed simple cooking/preparing at home techniques for lower sodium meals and snacks. Distributed recipes and snack ideas to patient to try. Pt verbalized understanding of material discussed today. Pt requests information about diabetes management. Encouraged protein/carb snack combination at night and discussed the difference between protein and carbs. Will follow up with further education as not to overwhelm pt with information. Distributed RD contact information.     Michaele Offer, MS, RDN, LDN

## 2019-03-17 LAB — BLOOD GAS, ARTERIAL
Acid-Base Excess: 10 mmol/L — ABNORMAL HIGH (ref 0.0–2.0)
Bicarbonate: 40.7 mmol/L — ABNORMAL HIGH (ref 20.0–28.0)
Delivery systems: POSITIVE
Drawn by: 270211
Expiratory PAP: 6
FIO2: 0.35
Inspiratory PAP: 14
O2 Saturation: 92.9 %
Patient temperature: 37
pCO2 arterial: 81.6 mmHg (ref 32.0–48.0)
pH, Arterial: 7.318 — ABNORMAL LOW (ref 7.350–7.450)
pO2, Arterial: 68.1 mmHg — ABNORMAL LOW (ref 83.0–108.0)

## 2019-03-17 NOTE — Progress Notes (Signed)
Pulmonary Individual Treatment Plan  Patient Details  Name: Rebecca Mcintyre MRN: [id number] Date of Birth: 13-May-1946 Referring Provider:     Pulmonary Rehab Walk Test from 01/28/2019 in [place]  Referring Provider  Dr. [name]      Initial Encounter Date:    Pulmonary Rehab Walk Test from 01/28/2019 in [place]  Date  01/28/19      Visit Diagnosis: Chronic obstructive pulmonary disease, unspecified COPD type ([place])  Patient's Home Medications on Admission:   Current Outpatient Medications:  .  acetaminophen (TYLENOL) 500 MG tablet, Take 1,000 mg by mouth every 6 (six) hours as needed for mild pain., Disp: , Rfl:  .  albuterol (VENTOLIN HFA) 108 (90 Base) MCG/ACT inhaler, Inhale 2 puffs into the lungs every 6 (six) hours as needed for wheezing or shortness of breath., Disp: 18 g, Rfl: 3 .  allopurinol (ZYLOPRIM) 100 MG tablet, Take 1 tablet (100 mg total) by mouth 2 (two) times daily. Take, Disp: 180 tablet, Rfl: 1 .  aspirin 81 MG tablet, Take 81 mg by mouth daily., Disp: , Rfl:  .  furosemide (LASIX) 40 MG tablet, TAKE 1 TABLET BY MOUTH EVERY DAY, Disp: 60 tablet, Rfl: 0 .  guaiFENesin (MUCINEX) 600 MG 12 hr tablet, Take by mouth 2 (two) times daily., Disp: , Rfl:  .  KLOR-CON M20 20 MEQ tablet, TAKE 1 TABLET BY MOUTH EVERY DAY, Disp: 30 tablet, Rfl: 0 .  metFORMIN (GLUCOPHAGE) 500 MG tablet, TAKE 1 TABLET (500 MG TOTAL) BY MOUTH 2 (TWO) TIMES DAILY WITH A MEAL., Disp: 180 tablet, Rfl: 1 .  metoprolol tartrate (LOPRESSOR) 25 MG tablet, TAKE 1/2 TABLETS BY MOUTH 2 TIMES DAILY, Disp: 90 tablet, Rfl: 1 .  montelukast (SINGULAIR) 10 MG tablet, TAKE 1 TABLET (10 MG TOTAL) AT BEDTIME BY MOUTH., Disp: 90 tablet, Rfl: 0 .  nitroGLYCERIN (NITROSTAT) 0.4 MG SL tablet, Place 1 tablet (0.4 mg total) under the tongue every 5 (five) minutes as needed., Disp: 30 tablet, Rfl: 0 .  rOPINIRole (REQUIP) 0.5 MG tablet, Start 1/2 tab qhs x 2  nights; then increased to 1 tab qhs for restless leg, Disp: 30 tablet, Rfl: 3 .  simvastatin (ZOCOR) 40 MG tablet, TAKE 1 TABLET BY MOUTH AT BEDTIME, Disp: 90 tablet, Rfl: 1 .  umeclidinium-vilanterol (ANORO ELLIPTA) 62.5-25 MCG/INH AEPB, Inhale 1 puff into the lungs daily., Disp: 1 each, Rfl: 5  Past Medical History: Past Medical History:  Diagnosis Date  . Allergy   . ASCUS on Pap smear    had repeat x 2 normal with [name] Plan 2011  . Coronary artery disease   . Gout   . Hyperlipidemia   . Hypertension   . Myocardial infarction [facility])    2002 95% proximal LAD stenosis with thrombus followed by 90% stenosis, the first diagonal 80% stenosis, circumflex 30% stenosis, circumflex obtuse marginal subbranch 70% stenosis, PDA 40% stenosis. She had a drug-eluting stent placement with a Pixel stent  . Osteopenia   . Solitary kidney     Tobacco Use: Social History   Tobacco Use  Smoking Status Former Smoker  . Packs/day: 1.00  . Years: 39.00  . Pack years: 39.00  . Types: Cigarettes  . Quit date: 11/04/2018  . Years since quitting: 0.3  Smokeless Tobacco Never Used  Tobacco Comment   39    Labs: Recent Review Flowsheet Data    Labs for ITP Cardiac and Pulmonary Rehab Latest  Ref Rng & Units 11/03/2018 11/05/2018 11/05/2018 11/05/2018 02/05/2019   Cholestrol 0 - 200 mg/dL - - - - 185   LDLCALC 0 - 99 mg/dL - - - - -   LDLDIRECT mg/dL - - - - 116.0   HDL >39.00 mg/dL - - - - 47.00   Trlycerides 0.0 - 149.0 mg/dL - - - - 226.0(H)   Hemoglobin A1c 4.6 - 6.5 % 7.0(H) - - - 6.7(H)   PHART 7.350 - 7.450 - 7.278(L) 7.318(L) 7.407 -   PCO2ART 32.0 - 48.0 mmHg - 85.1(HH) 81.6(HH) 66.6(HH) -   HCO3 20.0 - 28.0 mmol/L - 38.5(H) 40.7(H) 41.2(H) -   O2SAT % - 99.0 92.9 90.7 -      Capillary Blood Glucose: Lab Results  Component Value Date   GLUCAP 119 (H) 11/12/2018   GLUCAP 120 (H) 11/12/2018   GLUCAP 132 (H) 11/11/2018   GLUCAP 106 (H) 11/11/2018   GLUCAP 124 (H) 11/11/2018   POCT  Glucose    Row Name 02/02/19 1153 02/16/19 1143 03/16/19 1138         POCT Blood Glucose   Pre-Exercise  102 mg/dL  110 mg/dL  124 mg/dL     Post-Exercise  102 mg/dL  117 mg/dL  107 mg/dL        Pulmonary Assessment Scores: Pulmonary Assessment Scores    Row Name 01/28/19 1541         ADL UCSD   ADL Phase  Entry     SOB Score total  60       CAT Score   CAT Score  pre 11       UCSD: Self-administered rating of dyspnea associated with activities of daily living (ADLs) 6-point scale (0 = "not at all" to 5 = "maximal or unable to do because of breathlessness")  Scoring Scores range from 0 to 120.  Minimally important difference is 5 units  CAT: CAT can identify the health impairment of COPD patients and is better correlated with disease progression.  CAT has a scoring range of zero to 40. The CAT score is classified into four groups of low (less than 10), medium (10 - 20), high (21-30) and very high (31-40) based on the impact level of disease on health status. A CAT score over 10 suggests significant symptoms.  A worsening CAT score could be explained by an exacerbation, poor medication adherence, poor inhaler technique, or progression of COPD or comorbid conditions.  CAT MCID is 2 points  mMRC: mMRC (Modified Medical Research Council) Dyspnea Scale is used to assess the degree of baseline functional disability in patients of respiratory disease due to dyspnea. No minimal important difference is established. A decrease in score of 1 point or greater is considered a positive change.   Pulmonary Function Assessment: Pulmonary Function Assessment - 01/28/19 1509      Breath   Bilateral Breath Sounds  Clear    Shortness of Breath  Yes       Exercise Target Goals: Exercise Program Goal: Individual exercise prescription set using results from initial 6 min walk test and THRR while considering  patient's activity barriers and safety.   Exercise Prescription Goal: Initial  exercise prescription builds to 30-45 minutes a day of aerobic activity, 2-3 days per week.  Home exercise guidelines will be given to patient during program as part of exercise prescription that the participant will acknowledge.  Activity Barriers & Risk Stratification: Activity Barriers & Cardiac Risk Stratification - 01/28/19 1505  Activity Barriers & Cardiac Risk Stratification   Activity Barriers  Deconditioning;Muscular Weakness;Shortness of Breath       6 Minute Walk: 6 Minute Walk    Row Name 01/28/19 1602         6 Minute Walk   Phase  Initial     Distance  600 feet     Walk Time  4.5 minutes     # of Rest Breaks  1     MPH  1.13     METS  1.84     RPE  14     Perceived Dyspnea   2     VO2 Peak  5.2     Symptoms  Yes (comment)     Comments  1.5 minute standing break due to desaturation while on RA. Oxygen administered at 2L and saturations dropped again. Increased to 3 L and saturations were steady at 86-87     Resting HR  78 bpm     Resting BP  114/72     Resting Oxygen Saturation   91 %     Exercise Oxygen Saturation  during 6 min walk  80 %     Max Ex. HR  121 bpm     Max Ex. BP  164/84     2 Minute Post BP  122/68       Interval HR   1 Minute HR  121     2 Minute HR  92     3 Minute HR  86     4 Minute HR  97     5 Minute HR  102     6 Minute HR  107     2 Minute Post HR  82     Interval Heart Rate?  Yes       Interval Oxygen   Interval Oxygen?  Yes     Baseline Oxygen Saturation %  91 %     1 Minute Oxygen Saturation %  84 %     1 Minute Liters of Oxygen  0 L     2 Minute Oxygen Saturation %  80 %     2 Minute Liters of Oxygen  2 L     3 Minute Oxygen Saturation %  90 %     3 Minute Liters of Oxygen  2 L     4 Minute Oxygen Saturation %  85 %     4 Minute Liters of Oxygen  2 L     5 Minute Oxygen Saturation %  87 %     5 Minute Liters of Oxygen  3 L     6 Minute Oxygen Saturation %  86 %     6 Minute Liters of Oxygen  3 L     2 Minute  Post Oxygen Saturation %  96 %     2 Minute Post Liters of Oxygen  1 L        Oxygen Initial Assessment: Oxygen Initial Assessment - 01/28/19 1601      Home Oxygen   Home Oxygen Device  Portable Concentrator;Home Concentrator;E-Tanks    Sleep Oxygen Prescription  Continuous    Liters per minute  2    Home Exercise Oxygen Prescription  Continuous    Liters per minute  1    Home at Rest Exercise Oxygen Prescription  Continuous    Liters per minute  1    Compliance with Home Oxygen Use  Yes  Initial 6 min Walk   Oxygen Used  Continuous    Liters per minute  3      Program Oxygen Prescription   Program Oxygen Prescription  Continuous    Liters per minute  3    Comments  May have to go up to 4 L      Intervention   Short Term Goals  To learn and exhibit compliance with exercise, home and travel O2 prescription;To learn and understand importance of monitoring SPO2 with pulse oximeter and demonstrate accurate use of the pulse oximeter.;To learn and understand importance of maintaining oxygen saturations>88%;To learn and demonstrate proper pursed lip breathing techniques or other breathing techniques.;To learn and demonstrate proper use of respiratory medications    Long  Term Goals  Exhibits compliance with exercise, home and travel O2 prescription;Verbalizes importance of monitoring SPO2 with pulse oximeter and return demonstration;Maintenance of O2 saturations>88%;Exhibits proper breathing techniques, such as pursed lip breathing or other method taught during program session;Compliance with respiratory medication       Oxygen Re-Evaluation: Oxygen Re-Evaluation    Row Name 02/15/19 0910 03/16/19 0711           Program Oxygen Prescription   Program Oxygen Prescription  Continuous  Continuous      Liters per minute  3  3      Comments  -  Sats occasionaly drop on the recumbent bike and flow must be increased to 4L        Home Oxygen   Home Oxygen Device  Portable  Concentrator;Home Concentrator;E-Tanks  Portable Concentrator;Home Concentrator;E-Tanks      Sleep Oxygen Prescription  Continuous  Continuous      Liters per minute  2  2      Home Exercise Oxygen Prescription  Continuous  Continuous      Liters per minute  3  3      Home at Rest Exercise Oxygen Prescription  Continuous  Continuous      Liters per minute  1  1      Compliance with Home Oxygen Use  Yes  Yes        Goals/Expected Outcomes   Short Term Goals  To learn and exhibit compliance with exercise, home and travel O2 prescription;To learn and understand importance of monitoring SPO2 with pulse oximeter and demonstrate accurate use of the pulse oximeter.;To learn and understand importance of maintaining oxygen saturations>88%;To learn and demonstrate proper pursed lip breathing techniques or other breathing techniques.;To learn and demonstrate proper use of respiratory medications  To learn and exhibit compliance with exercise, home and travel O2 prescription;To learn and understand importance of monitoring SPO2 with pulse oximeter and demonstrate accurate use of the pulse oximeter.;To learn and understand importance of maintaining oxygen saturations>88%;To learn and demonstrate proper pursed lip breathing techniques or other breathing techniques.;To learn and demonstrate proper use of respiratory medications      Long  Term Goals  Exhibits compliance with exercise, home and travel O2 prescription;Verbalizes importance of monitoring SPO2 with pulse oximeter and return demonstration;Maintenance of O2 saturations>88%;Exhibits proper breathing techniques, such as pursed lip breathing or other method taught during program session;Compliance with respiratory medication  Exhibits compliance with exercise, home and travel O2 prescription;Verbalizes importance of monitoring SPO2 with pulse oximeter and return demonstration;Maintenance of O2 saturations>88%;Exhibits proper breathing techniques, such as  pursed lip breathing or other method taught during program session;Compliance with respiratory medication      Goals/Expected Outcomes  compliance  compliance  Oxygen Discharge (Final Oxygen Re-Evaluation): Oxygen Re-Evaluation - 03/16/19 0711      Program Oxygen Prescription   Program Oxygen Prescription  Continuous    Liters per minute  3    Comments  Sats occasionaly drop on the recumbent bike and flow must be increased to 4L      Home Oxygen   Home Oxygen Device  Portable Concentrator;Home Concentrator;E-Tanks    Sleep Oxygen Prescription  Continuous    Liters per minute  2    Home Exercise Oxygen Prescription  Continuous    Liters per minute  3    Home at Rest Exercise Oxygen Prescription  Continuous    Liters per minute  1    Compliance with Home Oxygen Use  Yes      Goals/Expected Outcomes   Short Term Goals  To learn and exhibit compliance with exercise, home and travel O2 prescription;To learn and understand importance of monitoring SPO2 with pulse oximeter and demonstrate accurate use of the pulse oximeter.;To learn and understand importance of maintaining oxygen saturations>88%;To learn and demonstrate proper pursed lip breathing techniques or other breathing techniques.;To learn and demonstrate proper use of respiratory medications    Long  Term Goals  Exhibits compliance with exercise, home and travel O2 prescription;Verbalizes importance of monitoring SPO2 with pulse oximeter and return demonstration;Maintenance of O2 saturations>88%;Exhibits proper breathing techniques, such as pursed lip breathing or other method taught during program session;Compliance with respiratory medication    Goals/Expected Outcomes  compliance       Initial Exercise Prescription: Initial Exercise Prescription - 01/28/19 1600      Date of Initial Exercise RX and Referring Provider   Date  01/28/19    Referring Provider  Dr. [name]      Oxygen   Oxygen  Continuous    Liters  3       Recumbant Bike   Level  1    Minutes  15      NuStep   Level  2    SPM  80    Minutes  15      Prescription Details   Frequency (times per week)  2    Duration  Progress to 30 minutes of continuous aerobic without signs/symptoms of physical distress      Intensity   THRR 40-80% of Max Heartrate  59-118    Ratings of Perceived Exertion  11-13    Perceived Dyspnea  0-4      Progression   Progression  Continue to progress workloads to maintain intensity without signs/symptoms of physical distress.      Resistance Training   Training Prescription  Yes    Weight  orange bands    Reps  10-15       Perform Capillary Blood Glucose checks as needed.  Exercise Prescription Changes: Exercise Prescription Changes    Row Name 02/02/19 1100 02/09/19 1200 02/16/19 1100 03/02/19 1100 03/16/19 1100     Response to Exercise   Blood Pressure (Admit)  130/62  -  124/64  120/66  120/70   Blood Pressure (Exercise)  152/78  -  144/82  126/72  144/68   Blood Pressure (Exit)  102/60  -  120/66  116/62  100/68   Heart Rate (Admit)  81 bpm  -  85 bpm  90 bpm  81 bpm   Heart Rate (Exercise)  98 bpm  -  109 bpm  94 bpm  101 bpm   Heart Rate (Exit)  88 bpm  -  92 bpm  88 bpm  90 bpm   Oxygen Saturation (Admit)  97 %  -  93 %  94 %  97 %   Oxygen Saturation (Exercise)  91 %  -  89 %  89 %  88 %   Oxygen Saturation (Exit)  95 %  -  92 %  95 %  93 %   Rating of Perceived Exertion (Exercise)  14  -  _0 Perceived Dyspnea (Exercise)  3  -  _1 Duration  Continue with 30 min of aerobic exercise without signs/symptoms of physical distress.  -  Continue with 30 min of aerobic exercise without signs/symptoms of physical distress.  Continue with 30 min of aerobic exercise without signs/symptoms of physical distress.  Continue with 30 min of aerobic exercise without signs/symptoms of physical distress.   Intensity  THRR unchanged  -  THRR unchanged  THRR unchanged  THRR unchanged      Progression   Progression  Continue to progress workloads to maintain intensity without signs/symptoms of physical distress.  -  Continue to progress workloads to maintain intensity without signs/symptoms of physical distress.  Continue to progress workloads to maintain intensity without signs/symptoms of physical distress.  Continue to progress workloads to maintain intensity without signs/symptoms of physical distress.     Resistance Training   Training Prescription  Yes  -  Yes  Yes  Yes   Weight  orange bands  -  -  -  -   Reps  10-15  -  10-15  10-15  10-15   Time  -  -  10 Minutes  10 Minutes  10 Minutes     Oxygen   Oxygen  -  -  Continuous  Continuous  Continuous   Liters  -  -  _2 Recumbant Bike   Level  1  -  1.5  1.5  1.5   Minutes  15  -  _3 NuStep   Level  2  -  _4 SPM  80  -  80  80  80   Minutes  15  -  _5 METs  -  -  2.2  2.3  2     Home Exercise Plan   Plans to continue exercise at  -  Home (comment)  -  -  -   Frequency  -  Add 2 additional days to program exercise sessions.  -  -  -   Initial Home Exercises Provided  -  02/09/19  -  -  -      Exercise Comments: Exercise Comments    Row Name 02/09/19 1203           Exercise Comments  home exercise complete          Exercise Goals and Review: Exercise Goals    Row Name 01/28/19 1613 02/15/19 0912 03/16/19 0712         Exercise Goals   Increase Physical Activity  Yes  Yes  Yes     Intervention  Provide advice, education, support and counseling about physical activity/exercise needs.;Develop an individualized exercise prescription for aerobic and resistive training based on initial evaluation findings, risk stratification, comorbidities and participant's personal goals.  Provide advice, education, support and  counseling about physical activity/exercise needs.;Develop an individualized exercise prescription for aerobic and resistive training based on initial  evaluation findings, risk stratification, comorbidities and participant's personal goals.  Provide advice, education, support and counseling about physical activity/exercise needs.;Develop an individualized exercise prescription for aerobic and resistive training based on initial evaluation findings, risk stratification, comorbidities and participant's personal goals.     Expected Outcomes  Short Term: Attend rehab on a regular basis to increase amount of physical activity.;Long Term: Add in home exercise to make exercise part of routine and to increase amount of physical activity.;Long Term: Exercising regularly at least 3-5 days a week.  Short Term: Attend rehab on a regular basis to increase amount of physical activity.;Long Term: Add in home exercise to make exercise part of routine and to increase amount of physical activity.;Long Term: Exercising regularly at least 3-5 days a week.  Short Term: Attend rehab on a regular basis to increase amount of physical activity.;Long Term: Add in home exercise to make exercise part of routine and to increase amount of physical activity.;Long Term: Exercising regularly at least 3-5 days a week.     Increase Strength and Stamina  Yes  Yes  Yes     Intervention  Provide advice, education, support and counseling about physical activity/exercise needs.;Develop an individualized exercise prescription for aerobic and resistive training based on initial evaluation findings, risk stratification, comorbidities and participant's personal goals.  Provide advice, education, support and counseling about physical activity/exercise needs.;Develop an individualized exercise prescription for aerobic and resistive training based on initial evaluation findings, risk stratification, comorbidities and participant's personal goals.  Provide advice, education, support and counseling about physical activity/exercise needs.;Develop an individualized exercise prescription for aerobic and  resistive training based on initial evaluation findings, risk stratification, comorbidities and participant's personal goals.     Expected Outcomes  Short Term: Increase workloads from initial exercise prescription for resistance, speed, and METs.;Short Term: Perform resistance training exercises routinely during rehab and add in resistance training at home;Long Term: Improve cardiorespiratory fitness, muscular endurance and strength as measured by increased METs and functional capacity (6MWT)  Short Term: Increase workloads from initial exercise prescription for resistance, speed, and METs.;Short Term: Perform resistance training exercises routinely during rehab and add in resistance training at home;Long Term: Improve cardiorespiratory fitness, muscular endurance and strength as measured by increased METs and functional capacity (6MWT)  Short Term: Increase workloads from initial exercise prescription for resistance, speed, and METs.;Short Term: Perform resistance training exercises routinely during rehab and add in resistance training at home;Long Term: Improve cardiorespiratory fitness, muscular endurance and strength as measured by increased METs and functional capacity (6MWT)     Able to understand and use rate of perceived exertion (RPE) scale  Yes  Yes  Yes     Intervention  Provide education and explanation on how to use RPE scale  Provide education and explanation on how to use RPE scale  Provide education and explanation on how to use RPE scale     Expected Outcomes  Short Term: Able to use RPE daily in rehab to express subjective intensity level;Long Term:  Able to use RPE to guide intensity level when exercising independently  Short Term: Able to use RPE daily in rehab to express subjective intensity level;Long Term:  Able to use RPE to guide intensity level when exercising independently  Short Term: Able to use RPE daily in rehab to express subjective intensity level;Long Term:  Able to use RPE to  guide intensity level when exercising  independently     Able to understand and use Dyspnea scale  Yes  Yes  Yes     Intervention  Provide education and explanation on how to use Dyspnea scale  Provide education and explanation on how to use Dyspnea scale  Provide education and explanation on how to use Dyspnea scale     Expected Outcomes  Short Term: Able to use Dyspnea scale daily in rehab to express subjective sense of shortness of breath during exertion;Long Term: Able to use Dyspnea scale to guide intensity level when exercising independently  Short Term: Able to use Dyspnea scale daily in rehab to express subjective sense of shortness of breath during exertion;Long Term: Able to use Dyspnea scale to guide intensity level when exercising independently  Short Term: Able to use Dyspnea scale daily in rehab to express subjective sense of shortness of breath during exertion;Long Term: Able to use Dyspnea scale to guide intensity level when exercising independently     Knowledge and understanding of Target Heart Rate Range (THRR)  Yes  Yes  Yes     Expected Outcomes  Short Term: Able to state/look up THRR;Short Term: Able to use daily as guideline for intensity in rehab;Long Term: Able to use THRR to govern intensity when exercising independently  Short Term: Able to state/look up THRR;Short Term: Able to use daily as guideline for intensity in rehab;Long Term: Able to use THRR to govern intensity when exercising independently  Short Term: Able to state/look up THRR;Short Term: Able to use daily as guideline for intensity in rehab;Long Term: Able to use THRR to govern intensity when exercising independently     Understanding of Exercise Prescription  Yes  Yes  Yes     Intervention  Provide education, explanation, and written materials on patient's individual exercise prescription  Provide education, explanation, and written materials on patient's individual exercise prescription  Provide education,  explanation, and written materials on patient's individual exercise prescription     Expected Outcomes  Short Term: Able to explain program exercise prescription;Long Term: Able to explain home exercise prescription to exercise independently  Short Term: Able to explain program exercise prescription;Long Term: Able to explain home exercise prescription to exercise independently  Short Term: Able to explain program exercise prescription;Long Term: Able to explain home exercise prescription to exercise independently        Exercise Goals Re-Evaluation : Exercise Goals Re-Evaluation    Row Name 02/15/19 0913 03/16/19 2130           Exercise Goal Re-Evaluation   Exercise Goals Review  Increase Physical Activity;Increase Strength and Stamina;Able to understand and use rate of perceived exertion (RPE) scale;Able to understand and use Dyspnea scale;Knowledge and understanding of Target Heart Rate Range (THRR);Understanding of Exercise Prescription  Increase Physical Activity;Increase Strength and Stamina;Able to understand and use rate of perceived exertion (RPE) scale;Able to understand and use Dyspnea scale;Knowledge and understanding of Target Heart Rate Range (THRR);Understanding of Exercise Prescription      Comments  Pt has completed 3 exercise sessions. Pt has a positive attitude and is motivated to adapt a more active lifestyle.Pt currently exercises at 2.4 METs on the stepper. Will continue to monitor and progress as able.  Pt has completed 9 exercise sessions. Pt still has a positive lifestyle and has become more active on her own. Progress has been slow, and she is still exercising at 2.4 METs on the stepper. Will continue to monitor and progress as able.      Expected  Outcomes  Through exercise at rehab and at home, the patient will decrease shortness of breath with daily activities and feel confident in carrying out an exercise regime at home.  Through exercise at rehab and at home, the patient  will decrease shortness of breath with daily activities and feel confident in carrying out an exercise regime at home.         Discharge Exercise Prescription (Final Exercise Prescription Changes): Exercise Prescription Changes - 03/16/19 1100      Response to Exercise   Blood Pressure (Admit)  120/70    Blood Pressure (Exercise)  144/68    Blood Pressure (Exit)  100/68    Heart Rate (Admit)  81 bpm    Heart Rate (Exercise)  101 bpm    Heart Rate (Exit)  90 bpm    Oxygen Saturation (Admit)  97 %    Oxygen Saturation (Exercise)  88 %    Oxygen Saturation (Exit)  93 %    Rating of Perceived Exertion (Exercise)  13    Perceived Dyspnea (Exercise)  2    Duration  Continue with 30 min of aerobic exercise without signs/symptoms of physical distress.    Intensity  THRR unchanged      Progression   Progression  Continue to progress workloads to maintain intensity without signs/symptoms of physical distress.      Resistance Training   Training Prescription  Yes    Reps  10-15    Time  10 Minutes      Oxygen   Oxygen  Continuous    Liters  3      Recumbant Bike   Level  1.5    Minutes  15      NuStep   Level  4    SPM  80    Minutes  15    METs  2       Nutrition:  Target Goals: Understanding of nutrition guidelines, daily intake of sodium <1578m, cholesterol <2013m calories 30% from fat and 7% or less from saturated fats, daily to have 5 or more servings of fruits and vegetables.  Biometrics: Pre Biometrics - 01/28/19 1504      Pre Biometrics   Height  _0  (1.6 m)    Weight  210 lb 1.6 oz (95.3 kg)    BMI (Calculated)  37.23    Grip Strength  32 kg        Nutrition Therapy Plan and Nutrition Goals:   Nutrition Assessments:   Nutrition Goals Re-Evaluation:   Nutrition Goals Discharge (Final Nutrition Goals Re-Evaluation):   Psychosocial: Target Goals: Acknowledge presence or absence of significant depression and/or stress, maximize coping skills,  provide positive support system. Participant is able to verbalize types and ability to use techniques and skills needed for reducing stress and depression.  Initial Review & Psychosocial Screening: Initial Psych Review & Screening - 01/28/19 1512      Initial Review   Current issues with  None Identified      Family Dynamics   Good Support System?  Yes      Barriers   Psychosocial barriers to participate in program  There are no identifiable barriers or psychosocial needs.      Screening Interventions   Interventions  Encouraged to exercise       Quality of Life Scores:  Scores of 19 and below usually indicate a poorer quality of life in these areas.  A difference of  2-3 points is a clinically meaningful  difference.  A difference of 2-3 points in the total score of the Quality of Life Index has been associated with significant improvement in overall quality of life, self-image, physical symptoms, and general health in studies assessing change in quality of life.  PHQ-9: Recent Review Flowsheet Data    Depression screen [facility] 2/9 01/28/2019 03/10/2017 04/26/2016 04/27/2015 04/18/2014   Decreased Interest 0 0 0 0 0   Down, Depressed, Hopeless 0 0 0 0 0   PHQ - 2 Score 0 0 0 0 0   Altered sleeping 0 - - - -   Tired, decreased energy 0 - - - -   Change in appetite 0 - - - -   Feeling bad or failure about yourself  0 - - - -   Trouble concentrating 0 - - - -   Moving slowly or fidgety/restless 0 - - - -   Suicidal thoughts 0 - - - -   PHQ-9 Score 0 - - - -   Difficult doing work/chores Not difficult at all - - - -     Interpretation of Total Score  Total Score Depression Severity:  1-4 = Minimal depression, 5-9 = Mild depression, 10-14 = Moderate depression, 15-19 = Moderately severe depression, 20-27 = Severe depression   Psychosocial Evaluation and Intervention: Psychosocial Evaluation - 01/28/19 1512      Psychosocial Evaluation & Interventions   Interventions  Encouraged to  exercise with the program and follow exercise prescription       Psychosocial Re-Evaluation: Psychosocial Re-Evaluation    Row Name 01/28/19 1513 02/15/19 1243 03/17/19 1016         Psychosocial Re-Evaluation   Current issues with  None Identified  None Identified  None Identified     Comments  -  no psychosocial concerns identified and no barriers to participation in pulmonary rehab  -     Expected Outcomes  -  No barriers to participation in pulmonary rehab  -     Interventions  Encouraged to attend Pulmonary Rehabilitation for the exercise  Encouraged to attend Pulmonary Rehabilitation for the exercise  Encouraged to attend Pulmonary Rehabilitation for the exercise     Continue Psychosocial Services   No Follow up required  No Follow up required  No Follow up required        Psychosocial Discharge (Final Psychosocial Re-Evaluation): Psychosocial Re-Evaluation - 03/17/19 1016      Psychosocial Re-Evaluation   Current issues with  None Identified    Interventions  Encouraged to attend Pulmonary Rehabilitation for the exercise    Continue Psychosocial Services   No Follow up required       Education: Education Goals: Education classes will be provided on a weekly basis, covering required topics. Participant will state understanding/return demonstration of topics presented.  Learning Barriers/Preferences:   Education Topics: Risk Factor Reduction:  -Group instruction that is supported by a PowerPoint presentation. Instructor discusses the definition of a risk factor, different risk factors for pulmonary disease, and how the heart and lungs work together.     Nutrition for Pulmonary Patient:  -Group instruction provided by PowerPoint slides, verbal discussion, and written materials to support subject matter. The instructor gives an explanation and review of healthy diet recommendations, which includes a discussion on weight management, recommendations for fruit and vegetable  consumption, as well as protein, fluid, caffeine, fiber, sodium, sugar, and alcohol. Tips for eating when patients are short of breath are discussed.   Pursed Lip Breathing:  -Group  instruction that is supported by demonstration and informational handouts. Instructor discusses the benefits of pursed lip and diaphragmatic breathing and detailed demonstration on how to preform both.     Oxygen Safety:  -Group instruction provided by PowerPoint, verbal discussion, and written material to support subject matter. There is an overview of "What is Oxygen" and "Why do we need it".  Instructor also reviews how to create a safe environment for oxygen use, the importance of using oxygen as prescribed, and the risks of noncompliance. There is a brief discussion on traveling with oxygen and resources the patient may utilize.   Oxygen Equipment:  -Group instruction provided by [facility] Staff utilizing handouts, written materials, and equipment demonstrations.   Signs and Symptoms:  -Group instruction provided by written material and verbal discussion to support subject matter. Warning signs and symptoms of infection, stroke, and heart attack are reviewed and when to call the physician/911 reinforced. Tips for preventing the spread of infection discussed.   Advanced Directives:  -Group instruction provided by verbal instruction and written material to support subject matter. Instructor reviews Advanced Directive laws and proper instruction for filling out document.   Pulmonary Video:  -Group video education that reviews the importance of medication and oxygen compliance, exercise, good nutrition, pulmonary hygiene, and pursed lip and diaphragmatic breathing for the pulmonary patient.   Exercise for the Pulmonary Patient:  -Group instruction that is supported by a PowerPoint presentation. Instructor discusses benefits of exercise, core components of exercise, frequency, duration, and intensity of an  exercise routine, importance of utilizing pulse oximetry during exercise, safety while exercising, and options of places to exercise outside of rehab.     Pulmonary Medications:  -Verbally interactive group education provided by instructor with focus on inhaled medications and proper administration.   Anatomy and Physiology of the Respiratory System and Intimacy:  -Group instruction provided by PowerPoint, verbal discussion, and written material to support subject matter. Instructor reviews respiratory cycle and anatomical components of the respiratory system and their functions. Instructor also reviews differences in obstructive and restrictive respiratory diseases with examples of each. Intimacy, Sex, and Sexuality differences are reviewed with a discussion on how relationships can change when diagnosed with pulmonary disease. Common sexual concerns are reviewed.   MD DAY -A group question and answer session with a medical doctor that allows participants to ask questions that relate to their pulmonary disease state.   OTHER EDUCATION -Group or individual verbal, written, or video instructions that support the educational goals of the pulmonary rehab program.   Holiday Eating Survival Tips:  -Group instruction provided by PowerPoint slides, verbal discussion, and written materials to support subject matter. The instructor gives patients tips, tricks, and techniques to help them not only survive but enjoy the holidays despite the onslaught of food that accompanies the holidays.   Knowledge Questionnaire Score: Knowledge Questionnaire Score - 01/28/19 1541      Knowledge Questionnaire Score   Pre Score  16/18       Core Components/Risk Factors/Patient Goals at Admission: Personal Goals and Risk Factors at Admission - 01/28/19 1542      Core Components/Risk Factors/Patient Goals on Admission   Improve shortness of breath with ADL's  Yes       Core Components/Risk Factors/Patient  Goals Review:  Goals and Risk Factor Review    Row Name 01/28/19 1542 02/15/19 1244 03/17/19 1017         Core Components/Risk Factors/Patient Goals Review   Personal Goals Review  Increase knowledge  of respiratory medications and ability to use respiratory devices properly.;Improve shortness of breath with ADL's;Develop more efficient breathing techniques such as purse lipped breathing and diaphragmatic breathing and practicing self-pacing with activity.  Increase knowledge of respiratory medications and ability to use respiratory devices properly.;Improve shortness of breath with ADL's;Develop more efficient breathing techniques such as purse lipped breathing and diaphragmatic breathing and practicing self-pacing with activity.  Increase knowledge of respiratory medications and ability to use respiratory devices properly.;Improve shortness of breath with ADL's;Develop more efficient breathing techniques such as purse lipped breathing and diaphragmatic breathing and practicing self-pacing with activity.     Review  -  Just began program, has attended 3 exercise sessions, is tolerating well, too early to see progressions to program goals.  Attended 10 exercise sessions, has increased workloads slowly each 30 days due to deconditioning.     Expected Outcomes  -  See admission goals  Will continue to increase workloads as tolerated and learn to exercise safely on her own at graduation date of 04-01-2019        Core Components/Risk Factors/Patient Goals at Discharge (Final Review):  Goals and Risk Factor Review - 03/17/19 1017      Core Components/Risk Factors/Patient Goals Review   Personal Goals Review  Increase knowledge of respiratory medications and ability to use respiratory devices properly.;Improve shortness of breath with ADL's;Develop more efficient breathing techniques such as purse lipped breathing and diaphragmatic breathing and practicing self-pacing with activity.    Review  Attended 10  exercise sessions, has increased workloads slowly each 30 days due to deconditioning.    Expected Outcomes  Will continue to increase workloads as tolerated and learn to exercise safely on her own at graduation date of 04-01-2019       ITP Comments:   Comments: ITP REVIEW Pt is making expected progress toward pulmonary rehab goals after completing 10 sessions. Recommend continued exercise, life style modification, education, and utilization of breathing techniques to increase stamina and strength and decrease shortness of breath with exertion.

## 2019-03-18 ENCOUNTER — Other Ambulatory Visit: Payer: Self-pay

## 2019-03-18 ENCOUNTER — Encounter (HOSPITAL_COMMUNITY)
Admission: RE | Admit: 2019-03-18 | Discharge: 2019-03-18 | Disposition: A | Discharge status: Home / Self Care | Payer: Medicare Other | Source: Ambulatory Visit | Attending: Pulmonary Disease | Admitting: Pulmonary Disease

## 2019-03-18 DIAGNOSIS — J449 Chronic obstructive pulmonary disease, unspecified: Secondary | ICD-10-CM

## 2019-03-18 NOTE — Progress Notes (Signed)
Daily Session Note  Patient Details  Name: Rebecca Mcintyre MRN: [id number] Date of Birth: May 19, 1946 Referring Provider:     Pulmonary Rehab Walk Test from 01/28/2019 in [place]  Referring Provider  Dr. [name]      Encounter Date: 03/18/2019  Check In:   Capillary Blood Glucose: No results found for this or any previous visit (from the past 24 hour(s)).    Social History   Tobacco Use  Smoking Status Former Smoker  . Packs/day: 1.00  . Years: 39.00  . Pack years: 39.00  . Types: Cigarettes  . Quit date: 11/04/2018  . Years since quitting: 0.3  Smokeless Tobacco Never Used  Tobacco Comment   39    Goals Met:  Personal goals reviewed No report of cardiac concerns or symptoms Strength training completed today  Goals Unmet:  Not Applicable  Comments: Service time is from 0955 to 1100 Rebecca Mcintyre's weight is elevated and she feels more SOB today. She has not taken her Lasix. She states that her best friend has been sick and she has been with her daily.She  has now pasted away. Rebecca Mcintyre plans to restart taking it today when she get home from exercising.   Dr. Rush Farmer is Medical Director for Pulmonary Rehab at [facility].

## 2019-03-21 ENCOUNTER — Other Ambulatory Visit: Payer: Self-pay | Admitting: Internal Medicine

## 2019-03-22 ENCOUNTER — Other Ambulatory Visit: Payer: Self-pay | Admitting: Internal Medicine

## 2019-03-23 ENCOUNTER — Encounter (HOSPITAL_COMMUNITY)
Admission: RE | Admit: 2019-03-23 | Discharge: 2019-03-23 | Disposition: A | Discharge status: Home / Self Care | Payer: Medicare Other | Source: Ambulatory Visit | Attending: Pulmonary Disease | Admitting: Pulmonary Disease

## 2019-03-23 ENCOUNTER — Other Ambulatory Visit: Payer: Self-pay | Admitting: Primary Care

## 2019-03-23 ENCOUNTER — Other Ambulatory Visit: Payer: Self-pay

## 2019-03-23 DIAGNOSIS — Z03818 Encounter for observation for suspected exposure to other biological agents ruled out: Secondary | ICD-10-CM | POA: Diagnosis not present

## 2019-03-23 DIAGNOSIS — J449 Chronic obstructive pulmonary disease, unspecified: Secondary | ICD-10-CM | POA: Diagnosis not present

## 2019-03-23 NOTE — Progress Notes (Signed)
Daily Session Note  Patient Details  Name: Rebecca Mcintyre MRN: [id number] Date of Birth: 24-Mar-1946 Referring Provider:     Pulmonary Rehab Walk Test from 01/28/2019 in [place]  Referring Provider  Dr. [name]      Encounter Date: 03/23/2019  Check In: Session Check In - 03/23/19 0952      Check-In   Supervising physician immediately available to respond to emergencies  [facility] immediately available    Physician(s)  Dr. Florene Glen    Location  [facility]    Staff Present  Rosebud Poles, RN, Bjorn Loser, MS, Exercise Physiologist;Lisa Ysidro Evert, RN    Virtual Visit  No    Medication changes reported      No    Fall or balance concerns reported     No    Tobacco Cessation  No Change    Warm-up and Cool-down  Performed as group-led instruction    Resistance Training Performed  Yes    VAD Patient?  No    PAD/SET Patient?  No      Pain Assessment   Currently in Pain?  No/denies    Multiple Pain Sites  No       Capillary Blood Glucose: No results found for this or any previous visit (from the past 24 hour(s)).    Social History   Tobacco Use  Smoking Status Former Smoker  . Packs/day: 1.00  . Years: 39.00  . Pack years: 39.00  . Types: Cigarettes  . Quit date: 11/04/2018  . Years since quitting: 0.3  Smokeless Tobacco Never Used  Tobacco Comment   39    Goals Met:  Proper associated with RPD/PD & O2 Sat Exercise tolerated well Strength training completed today  Goals Unmet:  Not Applicable  Comments: Service time is from 0950 to Standard    Dr. Rush Farmer is Medical Director for Pulmonary Rehab at [facility].

## 2019-03-25 ENCOUNTER — Encounter (HOSPITAL_COMMUNITY): Payer: Medicare Other

## 2019-03-30 ENCOUNTER — Other Ambulatory Visit: Payer: Self-pay

## 2019-03-30 ENCOUNTER — Encounter (HOSPITAL_COMMUNITY)
Admission: RE | Admit: 2019-03-30 | Discharge: 2019-03-30 | Disposition: A | Discharge status: Home / Self Care | Payer: Medicare Other | Source: Ambulatory Visit | Attending: Pulmonary Disease | Admitting: Pulmonary Disease

## 2019-03-30 VITALS — Wt 219.4 lb

## 2019-03-30 DIAGNOSIS — J449 Chronic obstructive pulmonary disease, unspecified: Secondary | ICD-10-CM | POA: Diagnosis not present

## 2019-03-30 NOTE — Progress Notes (Signed)
Daily Session Note  Patient Details  Name: CHRISTASIA ANGELETTI MRN: [id number] Date of Birth: 05/22/1946 Referring Provider:     Pulmonary Rehab Walk Test from 01/28/2019 in [place]  Referring Provider  Dr. [name]      Encounter Date: 03/30/2019  Check In: Session Check In - 03/30/19 1145      Check-In   Supervising physician immediately available to respond to emergencies  [facility] immediately available    Physician(s)  Dr. Tawanna Solo    Location  [facility]    Staff Present  Rosebud Poles, RN, Bjorn Loser, MS, Exercise Physiologist;Lisa Ysidro Evert, RN    Medication changes reported      No    Fall or balance concerns reported     No    Tobacco Cessation  No Change    Warm-up and Cool-down  Performed as group-led instruction    Resistance Training Performed  Yes    VAD Patient?  No    PAD/SET Patient?  No      Pain Assessment   Currently in Pain?  No/denies    Multiple Pain Sites  No       Capillary Blood Glucose: No results found for this or any previous visit (from the past 24 hour(s)). POCT Glucose - 03/30/19 1146      POCT Blood Glucose   Pre-Exercise  127 mg/dL    Post-Exercise  102 mg/dL      Exercise Prescription Changes - 03/30/19 1100      Response to Exercise   Blood Pressure (Admit)  136/66    Blood Pressure (Exercise)  144/80    Blood Pressure (Exit)  118/60    Heart Rate (Admit)  87 bpm    Heart Rate (Exercise)  103 bpm    Heart Rate (Exit)  86 bpm    Oxygen Saturation (Admit)  88 %    Oxygen Saturation (Exercise)  94 %    Oxygen Saturation (Exit)  97 %    Rating of Perceived Exertion (Exercise)  13    Perceived Dyspnea (Exercise)  2    Duration  Continue with 30 min of aerobic exercise without signs/symptoms of physical distress.    Intensity  THRR unchanged      Progression   Progression  Continue to progress workloads to maintain intensity without signs/symptoms of physical distress.       Resistance Training   Training Prescription  Yes    Reps  10-15    Time  10 Minutes      Oxygen   Oxygen  Continuous    Liters  3      Recumbant Bike   Level  1.5    Minutes  15      NuStep   Level  4    SPM  80    Minutes  15    METs  2.2       Social History   Tobacco Use  Smoking Status Former Smoker  . Packs/day: 1.00  . Years: 39.00  . Pack years: 39.00  . Types: Cigarettes  . Quit date: 11/04/2018  . Years since quitting: 0.4  Smokeless Tobacco Never Used  Tobacco Comment   39    Goals Met:  Proper associated with RPD/PD & O2 Sat Exercise tolerated well Strength training completed today  Goals Unmet:  Not Applicable  Comments: Service time is from 0953 to [place]    Dr. Rush Farmer is Medical  Director for Pulmonary Rehab at [facility]. 

## 2019-03-30 NOTE — Progress Notes (Signed)
Rebecca Mcintyre 73 y.o. female Nutrition Note Spoke with pt. Pt interested nutrition interventions for weight loss for better breathing and maintaining optimal blood sugars. She reports fasting CBG's of 90-130 mg/dl. Discussed weight loss and dieting history. Pt desires some structure. Diet recall reveals balanced meals but less healthy snack choices. Discussed mindful eating and snack options. Reviewed healthier choices eating out. She will keep a food log for the next week with goal of healthier snacks.  Will continue to monitor and evaluate with 1:1 nutrition counseling while pt is attending pulmonary rehab.  Michaele Offer, MS, RDN, LDN

## 2019-03-31 NOTE — Progress Notes (Signed)
Chief Complaint  Patient presents with  . Annual Exam    Pt states she has a rash under her breast that is still on going     HPI: Rebecca Mcintyre 73 y.o. comes in today for Preventive Medicare exam/ wellness visit .and Chronic disease management    Resp: as needed   Dr [name]  Evaluating .   To get sleep sat at night.  o2 as needed mostly  Rehab has been helpful;   If misses lasix   Gets   Breathing and fluid renetion.   Doing pulm rehab.  Dietician .     CAD stable  DM checking bg rehab pre and post  120 110 an lower after  On metformin  BP good   Rash under breast still itching using topical and hc s some help began after beginning exercises  Try to stay dry   Never had before no serious pain   No gout flares      Health Maintenance  Topic Date Due  . TETANUS/TDAP  09/13/2017  . FOOT EXAM  03/10/2018  . URINE MICROALBUMIN  03/10/2018  . OPHTHALMOLOGY EXAM  04/29/2019  . HEMOGLOBIN A1C  08/05/2019  . MAMMOGRAM  01/05/2021  . COLONOSCOPY  12/25/2021  . INFLUENZA VACCINE  Completed  . DEXA SCAN  Completed  . Hepatitis C Screening  Completed  . PNA vac Low Risk Adult  Completed   Health Maintenance Review LIFESTYLE:  Exercise:    pulm rehab  Tobacco/ETS:  no Alcohol:   seldom Sugar beverages:n Sleep:  8 hours  Drug use: no HH:1 work with daughter     Hearing: aids not today adapting fro  masking   Vision:  No limitations at present . Last eye check UTD  Safety:  Has smoke detector and wears seat belts.  No firearms. No excess sun exposure. Sees dentist regularly.  Falls: no recent .  Memory: Felt to be good  , no concern from her or her family.  Depression: No anhedonia unusual crying or depressive symptoms  Nutrition: Eats well balanced diet; adequate calcium and vitamin D. No swallowing chewing problems.  Injury: no major injuries in the last six months.  Other healthcare providers:  Reviewed today .  Preventive parameters: Reviewed   ADLS:    There are no problems or need for assistance  driving, feeding, obtaining food, dressing, toileting and bathing, managing money using phone. She is independent. 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 GI /GU: No adominal pain, vomiting, change in bowel habits. No blood in the stool. No significant GU symptoms. SKIN/HEME: ,no suspicious lesions or bleeding. No lymphadenopathy, nodules, masses.  NEURO/ PSYCH:  No neurologic signs such as weakness numbness. No depression anxiety. IMM/ Allergy: No unusual infections.  Allergy .   REST of 12 system review negative except as per HPI   Past Medical History:  Diagnosis Date  . Allergy   . ASCUS on Pap smear    had repeat x 2 normal with [name] Plan 2011  . Coronary artery disease   . Gout   . Hyperlipidemia   . Hypertension   . Myocardial infarction [facility])    2002 95% proximal LAD stenosis with thrombus followed by 90% stenosis, the first diagonal 80%  stenosis, circumflex 30% stenosis, circumflex obtuse marginal subbranch 70% stenosis, PDA 40% stenosis. She had a drug-eluting stent placement with a Pixel stent  . Osteopenia   . Solitary kidney     Family History  Problem Relation Age of Onset  . Breast cancer Mother 71  . Lung cancer Father   . Heart attack Brother        Died age 65 MI  . Colon cancer Brother   . Heart disease Daughter 104       smoker and overweight   . Esophageal cancer Neg Hx   . Pancreatic cancer Neg Hx   . Rectal cancer Neg Hx   . Stomach cancer Neg Hx     Social History   Socioeconomic History  . Marital status: Single    Spouse name: Not on file  . Number of children: 2  . Years of education: Not on file  . Highest education level: Not on file  Occupational History    Employer:  [employer]  . Financial resource strain: Not on file  . Food insecurity    Worry: Not on file    Inability: Not on file  . Transportation needs    Medical: Not on file    Non-medical: Not on file  Tobacco Use  . Smoking status: Former Smoker    Packs/day: 1.00    Years: 39.00    Pack years: 39.00    Types: Cigarettes    Quit date: 11/04/2018    Years since quitting: 0.4  . Smokeless tobacco: Never Used  . Tobacco comment: 39  Substance and Sexual Activity  . Alcohol use: Yes    Comment: 1-2 per month  . Drug use: No  . Sexual activity: Not on file  Lifestyle  . Physical activity    Days per week: Not on file    Minutes per session: Not on file  . Stress: Not on file  Relationships  . Social Herbalist on phone: Not on file    Gets together: Not on file    Attends religious service: Not on file    Active member of club or organization: Not on file    Attends meetings of clubs or organizations: Not on file    Relationship status: Not on file  Other Topics Concern  . 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    No pets       Outpatient Encounter Medications as of 04/02/2019  Medication Sig  . acetaminophen (TYLENOL) 500 MG tablet Take 1,000 mg by mouth every 6 (six) hours as needed for mild pain.  [name] albuterol (VENTOLIN HFA) 108 (90 Base) MCG/ACT inhaler Inhale 2 puffs into the lungs every 6 (six) hours as needed for wheezing or shortness of breath.  . allopurinol (ZYLOPRIM) 100 MG tablet Take 1 tablet (100 mg total) by mouth 2 (two) times daily. Take  . aspirin 81 MG tablet Take 81 mg by mouth daily.  . furosemide (LASIX) 40 MG tablet TAKE 1 TABLET BY MOUTH EVERY DAY  . guaiFENesin (MUCINEX) 600 MG 12 hr tablet Take by mouth 2 (two) times daily.  [name] KLOR-CON M20 20 MEQ tablet TAKE 1 TABLET BY MOUTH EVERY DAY  .  metFORMIN (GLUCOPHAGE) 500 MG tablet TAKE 1 TABLET (500 MG  TOTAL) BY MOUTH 2 (TWO) TIMES DAILY WITH A MEAL.  . metoprolol tartrate (LOPRESSOR) 25 MG tablet TAKE 1/2 TABLETS BY MOUTH 2 TIMES DAILY  . montelukast (SINGULAIR) 10 MG tablet TAKE 1 TABLET (10 MG TOTAL) AT BEDTIME BY MOUTH.  . nitroGLYCERIN (NITROSTAT) 0.4 MG SL tablet Place 1 tablet (0.4 mg total) under the tongue every 5 (five) minutes as needed.  [name] rOPINIRole (REQUIP) 0.5 MG tablet Start 1/2 tab qhs x 2 nights; then increased to 1 tab qhs for restless leg  . simvastatin (ZOCOR) 40 MG tablet TAKE 1 TABLET BY MOUTH AT BEDTIME  . umeclidinium-vilanterol (ANORO ELLIPTA) 62.5-25 MCG/INH AEPB Inhale 1 puff into the lungs daily.  . hydrocortisone 2.5 % cream Apply topically 2 (two) times daily.  [name] ketoconazole (NIZORAL) 2 % cream Apply 1 application topically 2 (two) times daily.   No facility-administered encounter medications on file as of 04/02/2019.     EXAM:  BP 130/70 (BP Location: Right Arm, Patient Position: Sitting, Cuff Size: Large)   Pulse 96   Temp 97.6 F (36.4 C) (Temporal)   Ht _0  (1.6 m)   Wt 217 lb 6.4 oz (98.6 kg)   SpO2 90%   BMI 38.51 kg/m   Body mass index is 38.51 kg/m.  Physical Exam: Vital signs reviewed ONG:EXBM is a well-developed well-nourished alert cooperative   who appears stated age in no acute distress.  Masked some dypsnea at times  Good color on RA HEENT: normocephalic atraumatic , Eyes: PERRL EOM's full, conjunctiva clear, Nares: paten,t no deformity discharge or tenderness., Ears: no deformity EAC's clear TMs with normal landmarks. Mouth:masked NECK: supple without masses, thyromegaly or bruits. CHEST/PULM:  Clear to auscultation and percussion breath sounds equal no wheeze , rales or rhonchi.  Dec BS  No chest wall deformities or tenderness. CV: PMI is nondisplaced, S1 S2 no gallops, murmurs, rubs. Peripheral pulses are full without delay.No JVD .  Breast: normal by inspection . No  dimpling, discharge, masses, tenderness or discharge . ABDOMEN: Bowel sounds normal nontender  No guard or rebound, no hepato splenomegal  But protuberant and hard to  palpates no fluid wave. no CVA tenderness.   Extremtities:  No clubbing cyanosis slight edema of ankles  no acute joint swelling or redness no focal atrophy NEURO:  Oriented x3, cranial nerves 3-12 appear to be intact, no obvious focal weakness,gait within normal limits no abnormal reflexes or asymmetrical SKIN: mod  intertrigo  Right with satellite bumps  No weeping  normal turgor, color, no bruising or petechiae. PSYCH: Oriented, good eye contact, no obvious depression anxiety, cognition and judgment appear normal. LN: no cervical axillary inguinal adenopathy No noted deficits in memory, attention, and speech. Diabetic Foot Exam - Simple   Simple Foot Form Diabetic Foot exam was performed with the following findings: Yes 04/02/2019  8:40 AM  Visual Inspection No deformities, no ulcerations, no other skin breakdown bilaterally: Yes Sensation Testing Intact to touch and monofilament testing bilaterally: Yes Pulse Check Posterior Tibialis and Dorsalis pulse intact bilaterally: Yes Comments Dry skin     Depression screen [facility] 2/9 01/28/2019 03/10/2017 04/26/2016 04/27/2015 04/18/2014  Decreased Interest 0 0 0 0 0  Down, Depressed, Hopeless 0 0 0 0 0  PHQ - 2 Score 0 0 0 0 0  Altered sleeping 0 - - - -  Tired, decreased energy 0 - - - -  Change in appetite 0 - - - -  Feeling bad or failure about yourself  0 - - - -  Trouble concentrating 0 - - - -  Moving slowly or fidgety/restless 0 - - - -  Suicidal thoughts 0 - - - -  PHQ-9 Score 0 - - - -  Difficult doing work/chores Not difficult at all - - - -     Lab Results  Component Value Date   WBC 8.8 01/01/2019   HGB 14.1 01/01/2019   HCT 42.5 01/01/2019   PLT 317 01/01/2019   GLUCOSE 98 02/05/2019   CHOL 185 02/05/2019   TRIG 226.0 (H) 02/05/2019   HDL 47.00  02/05/2019   LDLDIRECT 116.0 02/05/2019   LDLCALC 74 03/10/2018   ALT 34 11/12/2018   AST 31 11/12/2018   NA 141 02/05/2019   K 4.1 02/05/2019   CL 100 02/05/2019   CREATININE 0.80 02/05/2019   BUN 21 02/05/2019   CO2 30 02/05/2019   TSH 1.266 11/05/2018   HGBA1C 6.7 (H) 02/05/2019   MICROALBUR 0.7 04/02/2019   Wt Readings from Last 3 Encounters:  04/02/19 217 lb 6.4 oz (98.6 kg)  03/30/19 219 lb 5.7 oz (99.5 kg)  03/16/19 214 lb 11.7 oz (97.4 kg)    ASSESSMENT AND PLAN:  Discussed the following assessment and plan:  Visit for preventive health examination  Medication management  Type 2 diabetes mellitus without complication, without long-term current use of insulin ([place]) - Plan: Microalbumin / creatinine urine ratio  Intertrigo  Pulmonary emphysema, unspecified emphysema type (HCC)  Hyperlipidemia, unspecified hyperlipidemia type  Congestive heart failure, unspecified HF chronicity, unspecified heart failure type (HCC)  Centrilobular emphysema (HCC)  Decreased hearing of both ears Reviewed plan    rx for intertrigo step [place] rx  And plan  Fu dm  And meds  Pulmonary  condition is the most  Prominent sx at this time   Patient Care Team: Burnis Medin, MD as PCP - General Paula Compton, MD as Attending Physician (Obstetrics and Gynecology) Irene Shipper, MD as Attending Physician (Gastroenterology) Sharyne Peach, MD as Consulting Physician (Ophthalmology) Minus Breeding, MD as Consulting Physician (Cardiology)  Patient Instructions  Glad you  a re doing better.     plan  ROV in jan feb 21 for fu hg a1c  Can  get shingrix at your pharmacy when ready .  Intertrigo  Stay as dry as possible  Consider sports bra type .     Intertrigo Intertrigo is skin irritation or inflammation (dermatitis) that occurs when folds of skin rub together. The irritation can cause a rash and make skin raw and itchy. This condition most commonly occurs in the skin folds of  these areas:  Toes.  Armpits.  Groin.  Under the belly.  Under the breasts.  Buttocks. Intertrigo is not passed from person to person (is not contagious). What are the causes? This condition is caused by heat, moisture, rubbing (friction), and not enough air circulation. The condition can be made worse by:  Sweat.  Bacteria.  A fungus, such as yeast. What increases the risk? This condition is more likely to occur if you have moisture in your skin folds. You are more likely to develop this condition if you:  Have diabetes.  Are overweight.  Are not able to move around or are not active.  Live in a warm and moist climate.  Wear splints, braces, or other medical devices.  Are not able to control your bowels or bladder (have incontinence). What are the signs or symptoms? Symptoms of this condition include:  A pink or red skin  rash in the skin fold or near the skin fold.  Raw or scaly skin.  Itchiness.  A burning feeling.  Bleeding.  Leaking fluid.  A bad smell. How is this diagnosed? This condition is diagnosed with a medical history and physical exam. You may also have a skin swab to test for bacteria or a fungus. How is this treated? This condition may be treated by:  Cleaning and drying your skin.  Taking an antibiotic medicine or using an antibiotic skin cream for a bacterial infection.  Using an antifungal cream on your skin or taking pills for an infection that was caused by a fungus, such as yeast.  Using a steroid ointment to relieve itchiness and irritation.  Separating the skin fold with a clean cotton cloth to absorb moisture and allow air to flow into the area. Follow these instructions at home:  Keep the affected area clean and dry.  Do not scratch your skin.  Stay in a cool environment as much as possible. Use an air conditioner or fan, if available.  Apply over-the-counter and prescription medicines only as told by your health care  provider.  If you were prescribed an antibiotic medicine, use it as told by your health care provider. Do not stop using the antibiotic even if your condition improves.  Keep all follow-up visits as told by your health care provider. This is important. How is this prevented?   Maintain a healthy weight.  Take care of your feet, especially if you have diabetes. Foot care includes: ? Wearing shoes that fit well. ? Keeping your feet dry. ? Wearing clean, breathable socks.  Protect the skin around your groin and buttocks, especially if you have incontinence. Skin protection includes: ? Following a regular cleaning routine. ? Using skin protectant creams, powders, or ointments. ? Changing protection pads frequently.  Do not wear tight clothes. Wear clothes that are loose, absorbent, and made of cotton.  Wear a bra that gives good support, if needed.  Shower and dry yourself well after activity or exercise. Use a hair dryer on a cool setting to dry between skin folds, especially after you bathe.  If you have diabetes, keep your blood sugar under control. Contact a health care provider if:  Your symptoms do not improve with treatment.  Your symptoms get worse or they spread.  You notice increased redness and warmth.  You have a fever. Summary  Intertrigo is skin irritation or inflammation (dermatitis) that occurs when folds of skin rub together.  This condition is caused by heat, moisture, rubbing (friction), and not enough air circulation.  This condition may be treated by cleaning and drying your skin and with medicines.  Apply over-the-counter and prescription medicines only as told by your health care provider.  Keep all follow-up visits as told by your health care provider. This is important. This information is not intended to replace advice given to you by your health care provider. Make sure you discuss any questions you have with your health care provider. Document  Released: 05/13/2005 Document Revised: 10/13/2017 Document Reviewed: 10/13/2017 Elsevier Patient Education  2020 [place] Evelise Reine M.D.

## 2019-04-01 ENCOUNTER — Other Ambulatory Visit: Payer: Self-pay

## 2019-04-01 ENCOUNTER — Encounter (HOSPITAL_COMMUNITY)
Admission: RE | Admit: 2019-04-01 | Discharge: 2019-04-01 | Disposition: A | Discharge status: Home / Self Care | Payer: Medicare Other | Source: Ambulatory Visit | Attending: Pulmonary Disease | Admitting: Pulmonary Disease

## 2019-04-01 DIAGNOSIS — J449 Chronic obstructive pulmonary disease, unspecified: Secondary | ICD-10-CM

## 2019-04-01 NOTE — Progress Notes (Signed)
Daily Session Note  Patient Details  Name: Rebecca Mcintyre MRN: [id number] Date of Birth: [birth year] Referring Provider:     Pulmonary Rehab Walk Test from 01/28/2019 in [place]  Referring Provider  Dr. [name]      Encounter Date: 04/01/2019  Check In: Session Check In - 04/01/19 0958      Check-In   Supervising physician immediately available to respond to emergencies  [facility] immediately available    Physician(s)  Dr. Tawanna Solo    Location  [facility]    Staff Present  Rosebud Poles, RN, Bjorn Loser, MS, Exercise Physiologist;Markiah Janeway Ysidro Evert, RN    Virtual Visit  No    Medication changes reported      No    Fall or balance concerns reported     No    Tobacco Cessation  No Change    Warm-up and Cool-down  Performed as group-led instruction    Resistance Training Performed  Yes    VAD Patient?  No    PAD/SET Patient?  No      Pain Assessment   Currently in Pain?  No/denies    Multiple Pain Sites  No       Capillary Blood Glucose: No results found for this or any previous visit (from the past 24 hour(s)).    Social History   Tobacco Use  Smoking Status Former Smoker  . Packs/day: 1.00  . Years: 39.00  . Pack years: 39.00  . Types: Cigarettes  . Quit date: 11/04/2018  . Years since quitting: 0.4  Smokeless Tobacco Never Used  Tobacco Comment   39    Goals Met:  Exercise tolerated well No report of cardiac concerns or symptoms Strength training completed today  Goals Unmet:  Not Applicable  Comments: Service time is from 0905 to 1104    Dr. Rush Farmer is Medical Director for Pulmonary Rehab at [facility].

## 2019-04-02 ENCOUNTER — Ambulatory Visit (INDEPENDENT_AMBULATORY_CARE_PROVIDER_SITE_OTHER): Payer: Medicare Other | Admitting: Internal Medicine

## 2019-04-02 ENCOUNTER — Encounter: Payer: Self-pay | Admitting: Internal Medicine

## 2019-04-02 VITALS — BP 130/70 | HR 96 | Temp 97.6°F | Ht 63.0 in | Wt 217.4 lb

## 2019-04-02 DIAGNOSIS — L304 Erythema intertrigo: Secondary | ICD-10-CM | POA: Diagnosis not present

## 2019-04-02 DIAGNOSIS — J439 Emphysema, unspecified: Secondary | ICD-10-CM

## 2019-04-02 DIAGNOSIS — H9193 Unspecified hearing loss, bilateral: Secondary | ICD-10-CM

## 2019-04-02 DIAGNOSIS — J432 Centrilobular emphysema: Secondary | ICD-10-CM

## 2019-04-02 DIAGNOSIS — Z79899 Other long term (current) drug therapy: Secondary | ICD-10-CM

## 2019-04-02 DIAGNOSIS — E119 Type 2 diabetes mellitus without complications: Secondary | ICD-10-CM

## 2019-04-02 DIAGNOSIS — I509 Heart failure, unspecified: Secondary | ICD-10-CM

## 2019-04-02 DIAGNOSIS — Z0000 Encounter for general adult medical examination without abnormal findings: Secondary | ICD-10-CM

## 2019-04-02 DIAGNOSIS — E785 Hyperlipidemia, unspecified: Secondary | ICD-10-CM

## 2019-04-02 LAB — MICROALBUMIN / CREATININE URINE RATIO
Creatinine,U: 101.3 mg/dL
Microalb Creat Ratio: 0.7 mg/g (ref 0.0–30.0)
Microalb, Ur: 0.7 mg/dL (ref 0.0–1.9)

## 2019-04-02 MED ORDER — HYDROCORTISONE 2.5 % EX CREA: TOPICAL_CREAM | Freq: Two times a day (BID) | CUTANEOUS | 0 refills | Status: DC

## 2019-04-02 MED ORDER — KETOCONAZOLE 2 % EX CREA: 1.0000 "application " | TOPICAL_CREAM | Freq: Two times a day (BID) | CUTANEOUS | 0 refills | Status: DC

## 2019-04-02 NOTE — Patient Instructions (Addendum)
Glad you  a re doing better.     plan  ROV in jan feb 21 for fu hg a1c  Can  get shingrix at your pharmacy when ready .  Intertrigo  Stay as dry as possible  Consider sports bra type .     Intertrigo Intertrigo is skin irritation or inflammation (dermatitis) that occurs when folds of skin rub together. The irritation can cause a rash and make skin raw and itchy. This condition most commonly occurs in the skin folds of these areas:  Toes.  Armpits.  Groin.  Under the belly.  Under the breasts.  Buttocks. Intertrigo is not passed from person to person (is not contagious). What are the causes? This condition is caused by heat, moisture, rubbing (friction), and not enough air circulation. The condition can be made worse by:  Sweat.  Bacteria.  A fungus, such as yeast. What increases the risk? This condition is more likely to occur if you have moisture in your skin folds. You are more likely to develop this condition if you:  Have diabetes.  Are overweight.  Are not able to move around or are not active.  Live in a warm and moist climate.  Wear splints, braces, or other medical devices.  Are not able to control your bowels or bladder (have incontinence). What are the signs or symptoms? Symptoms of this condition include:  A pink or red skin rash in the skin fold or near the skin fold.  Raw or scaly skin.  Itchiness.  A burning feeling.  Bleeding.  Leaking fluid.  A bad smell. How is this diagnosed? This condition is diagnosed with a medical history and physical exam. You may also have a skin swab to test for bacteria or a fungus. How is this treated? This condition may be treated by:  Cleaning and drying your skin.  Taking an antibiotic medicine or using an antibiotic skin cream for a bacterial infection.  Using an antifungal cream on your skin or taking pills for an infection that was caused by a fungus, such as yeast.  Using a steroid ointment to  relieve itchiness and irritation.  Separating the skin fold with a clean cotton cloth to absorb moisture and allow air to flow into the area. Follow these instructions at home:  Keep the affected area clean and dry.  Do not scratch your skin.  Stay in a cool environment as much as possible. Use an air conditioner or fan, if available.  Apply over-the-counter and prescription medicines only as told by your health care provider.  If you were prescribed an antibiotic medicine, use it as told by your health care provider. Do not stop using the antibiotic even if your condition improves.  Keep all follow-up visits as told by your health care provider. This is important. How is this prevented?   Maintain a healthy weight.  Take care of your feet, especially if you have diabetes. Foot care includes: ? Wearing shoes that fit well. ? Keeping your feet dry. ? Wearing clean, breathable socks.  Protect the skin around your groin and buttocks, especially if you have incontinence. Skin protection includes: ? Following a regular cleaning routine. ? Using skin protectant creams, powders, or ointments. ? Changing protection pads frequently.  Do not wear tight clothes. Wear clothes that are loose, absorbent, and made of cotton.  Wear a bra that gives good support, if needed.  Shower and dry yourself well after activity or exercise. Use a hair dryer on  a cool setting to dry between skin folds, especially after you bathe.  If you have diabetes, keep your blood sugar under control. Contact a health care provider if:  Your symptoms do not improve with treatment.  Your symptoms get worse or they spread.  You notice increased redness and warmth.  You have a fever. Summary  Intertrigo is skin irritation or inflammation (dermatitis) that occurs when folds of skin rub together.  This condition is caused by heat, moisture, rubbing (friction), and not enough air circulation.  This condition  may be treated by cleaning and drying your skin and with medicines.  Apply over-the-counter and prescription medicines only as told by your health care provider.  Keep all follow-up visits as told by your health care provider. This is important. This information is not intended to replace advice given to you by your health care provider. Make sure you discuss any questions you have with your health care provider. Document Released: 05/13/2005 Document Revised: 10/13/2017 Document Reviewed: 10/13/2017 Elsevier Patient Education  2020 [employer].

## 2019-04-06 ENCOUNTER — Telehealth (HOSPITAL_COMMUNITY): Payer: Self-pay | Admitting: Internal Medicine

## 2019-04-06 ENCOUNTER — Encounter (HOSPITAL_COMMUNITY): Payer: Medicare Other

## 2019-04-06 DIAGNOSIS — J9612 Chronic respiratory failure with hypercapnia: Secondary | ICD-10-CM

## 2019-04-06 DIAGNOSIS — J9611 Chronic respiratory failure with hypoxia: Secondary | ICD-10-CM

## 2019-04-08 ENCOUNTER — Encounter (HOSPITAL_COMMUNITY): Payer: Medicare Other

## 2019-04-08 ENCOUNTER — Other Ambulatory Visit: Payer: Self-pay | Admitting: Internal Medicine

## 2019-04-13 ENCOUNTER — Other Ambulatory Visit: Payer: Self-pay

## 2019-04-13 ENCOUNTER — Encounter (HOSPITAL_COMMUNITY)
Admission: RE | Admit: 2019-04-13 | Discharge: 2019-04-13 | Disposition: A | Discharge status: Home / Self Care | Payer: Medicare Other | Source: Ambulatory Visit | Attending: Pulmonary Disease | Admitting: Pulmonary Disease

## 2019-04-13 DIAGNOSIS — J449 Chronic obstructive pulmonary disease, unspecified: Secondary | ICD-10-CM

## 2019-04-14 DIAGNOSIS — I5033 Acute on chronic diastolic (congestive) heart failure: Secondary | ICD-10-CM | POA: Diagnosis not present

## 2019-04-14 DIAGNOSIS — J449 Chronic obstructive pulmonary disease, unspecified: Secondary | ICD-10-CM | POA: Diagnosis not present

## 2019-04-15 ENCOUNTER — Encounter (HOSPITAL_COMMUNITY): Payer: Medicare Other

## 2019-04-22 ENCOUNTER — Encounter: Payer: Self-pay | Admitting: Pulmonary Disease

## 2019-04-22 DIAGNOSIS — R0902 Hypoxemia: Secondary | ICD-10-CM | POA: Diagnosis not present

## 2019-04-22 DIAGNOSIS — J449 Chronic obstructive pulmonary disease, unspecified: Secondary | ICD-10-CM | POA: Diagnosis not present

## 2019-04-23 ENCOUNTER — Encounter: Payer: Self-pay | Admitting: Pulmonary Disease

## 2019-04-24 ENCOUNTER — Other Ambulatory Visit: Payer: Self-pay | Admitting: Internal Medicine

## 2019-04-28 NOTE — Telephone Encounter (Signed)
Checked RA's look-at folder and found patient's ONO results.   RA, please advise when you have a chance. Thanks!

## 2019-04-29 NOTE — Telephone Encounter (Signed)
ONO on CPAP/room air showed significant desaturation for at least 5.5 hours Continue on oxygen 2L  blended into CPAP during sleep

## 2019-05-02 ENCOUNTER — Other Ambulatory Visit: Payer: Self-pay | Admitting: Internal Medicine

## 2019-05-03 NOTE — Progress Notes (Signed)
Discharge Progress Report  Patient Details  Name: Rebecca Mcintyre MRN: [id number] Date of Birth: [birth year] Referring Provider:     Pulmonary Rehab Walk Test from 01/28/2019 in [place]  Referring Provider  Dr. [name]       Number of Visits: 15  Reason for Discharge:  Patient reached a stable level of exercise. Patient independent in their exercise. Patient has met program and personal goals.  Smoking History:  Social History   Tobacco Use  Smoking Status Former Smoker  . Packs/day: 1.00  . Years: 39.00  . Pack years: 39.00  . Types: Cigarettes  . Quit date: 11/04/2018  . Years since quitting: 0.4  Smokeless Tobacco Never Used  Tobacco Comment   39    Diagnosis:  Chronic obstructive pulmonary disease, unspecified COPD type ([place])  ADL [facility]: Pulmonary Assessment Scores    Row Name 01/28/19 1541 04/13/19 1135       ADL [facility]   ADL Phase  Entry  Exit    SOB Score total  60  -      CAT Score   CAT Score  pre 11  -      mMRC Score   mMRC Score  -  2       Initial Exercise Prescription: Initial Exercise Prescription - 01/28/19 1600      Date of Initial Exercise RX and Referring Provider   Date  01/28/19    Referring Provider  Dr. [name]      Oxygen   Oxygen  Continuous    Liters  3      Recumbant Bike   Level  1    Minutes  15      NuStep   Level  2    SPM  80    Minutes  15      Prescription Details   Frequency (times per week)  2    Duration  Progress to 30 minutes of continuous aerobic without signs/symptoms of physical distress      Intensity   THRR 40-80% of Max Heartrate  59-118    Ratings of Perceived Exertion  11-13    Perceived Dyspnea  0-4      Progression   Progression  Continue to progress workloads to maintain intensity without signs/symptoms of physical distress.      Resistance Training   Training Prescription  Yes    Weight  orange bands    Reps  10-15       Discharge Exercise Prescription  (Final Exercise Prescription Changes): Exercise Prescription Changes - 03/30/19 1100      Response to Exercise   Blood Pressure (Admit)  136/66    Blood Pressure (Exercise)  144/80    Blood Pressure (Exit)  118/60    Heart Rate (Admit)  87 bpm    Heart Rate (Exercise)  103 bpm    Heart Rate (Exit)  86 bpm    Oxygen Saturation (Admit)  88 %    Oxygen Saturation (Exercise)  94 %    Oxygen Saturation (Exit)  97 %    Rating of Perceived Exertion (Exercise)  13    Perceived Dyspnea (Exercise)  2    Duration  Continue with 30 min of aerobic exercise without signs/symptoms of physical distress.    Intensity  THRR unchanged      Progression   Progression  Continue to progress workloads to maintain intensity without signs/symptoms of physical distress.      Resistance  Training   Training Prescription  Yes    Reps  10-15    Time  10 Minutes      Oxygen   Oxygen  Continuous    Liters  3      Recumbant Bike   Level  1.5    Minutes  15      NuStep   Level  4    SPM  80    Minutes  15    METs  2.2       Functional Capacity: 6 Minute Walk    Row Name 01/28/19 1602 04/13/19 1135       6 Minute Walk   Phase  Initial  Discharge    Distance  600 feet  830 feet    Distance Feet Change  -  230 ft    Walk Time  4.5 minutes  5.17 minutes    # of Rest Breaks  1  2    MPH  1.13  1.57    METS  1.84  2.23    RPE  14  13    Perceived Dyspnea   2  2    VO2 Peak  5.2  5.69    Symptoms  Yes (comment)  Yes (comment)    Comments  1.5 minute standing break due to desaturation while on RA. Oxygen administered at 2L and saturations dropped again. Increased to 3 L and saturations were steady at 86-87  one standing break due to SOB for 10 sec and one standing rest break for 40 sec due to desat    Resting HR  78 bpm  85 bpm    Resting BP  114/72  134/76    Resting Oxygen Saturation   91 %  95 %    Exercise Oxygen Saturation  during 6 min walk  80 %  84 %    Max Ex. HR  121 bpm  109 bpm     Max Ex. BP  164/84  158/80    2 Minute Post BP  122/68  132/70      Interval HR   1 Minute HR  121  101    2 Minute HR  92  100    3 Minute HR  86  97    4 Minute HR  97  109    5 Minute HR  102  103    6 Minute HR  107  107    2 Minute Post HR  82  84    Interval Heart Rate?  Yes  Yes      Interval Oxygen   Interval Oxygen?  Yes  Yes    Baseline Oxygen Saturation %  91 %  95 %    1 Minute Oxygen Saturation %  84 %  93 %    1 Minute Liters of Oxygen  0 L  3 L    2 Minute Oxygen Saturation %  80 %  89 %    2 Minute Liters of Oxygen  2 L  3 L    3 Minute Oxygen Saturation %  90 %  88 %    3 Minute Liters of Oxygen  2 L  3 L    4 Minute Oxygen Saturation %  85 %  84 %    4 Minute Liters of Oxygen  2 L  3 L    5 Minute Oxygen Saturation %  87 %  92 %    5 Minute  Liters of Oxygen  3 L  4 L    6 Minute Oxygen Saturation %  86 %  89 %    6 Minute Liters of Oxygen  3 L  4 L    2 Minute Post Oxygen Saturation %  96 %  96 %    2 Minute Post Liters of Oxygen  1 L  3 L       Psychological, QOL, Others - Outcomes: PHQ 2/9: Depression screen [facility] 2/9 01/28/2019 03/10/2017 04/26/2016 04/27/2015 04/18/2014  Decreased Interest 0 0 0 0 0  Down, Depressed, Hopeless 0 0 0 0 0  PHQ - 2 Score 0 0 0 0 0  Altered sleeping 0 - - - -  Tired, decreased energy 0 - - - -  Change in appetite 0 - - - -  Feeling bad or failure about yourself  0 - - - -  Trouble concentrating 0 - - - -  Moving slowly or fidgety/restless 0 - - - -  Suicidal thoughts 0 - - - -  PHQ-9 Score 0 - - - -  Difficult doing work/chores Not difficult at all - - - -    Quality of Life:   Personal Goals: Goals established at orientation with interventions provided to work toward goal. Personal Goals and Risk Factors at Admission - 01/28/19 1542      Core Components/Risk Factors/Patient Goals on Admission   Improve shortness of breath with ADL's  Yes        Personal Goals Discharge: Goals and Risk Factor Review    Row  Name 01/28/19 1542 02/15/19 1244 03/17/19 1017 04/12/19 0937       Core Components/Risk Factors/Patient Goals Review   Personal Goals Review  Increase knowledge of respiratory medications and ability to use respiratory devices properly.;Improve shortness of breath with ADL's;Develop more efficient breathing techniques such as purse lipped breathing and diaphragmatic breathing and practicing self-pacing with activity.  Increase knowledge of respiratory medications and ability to use respiratory devices properly.;Improve shortness of breath with ADL's;Develop more efficient breathing techniques such as purse lipped breathing and diaphragmatic breathing and practicing self-pacing with activity.  Increase knowledge of respiratory medications and ability to use respiratory devices properly.;Improve shortness of breath with ADL's;Develop more efficient breathing techniques such as purse lipped breathing and diaphragmatic breathing and practicing self-pacing with activity.  Develop more efficient breathing techniques such as purse lipped breathing and diaphragmatic breathing and practicing self-pacing with activity.;Increase knowledge of respiratory medications and ability to use respiratory devices properly.;Improve shortness of breath with ADL's    Review  -  Just began program, has attended 3 exercise sessions, is tolerating well, too early to see progressions to program goals.  Attended 10 exercise sessions, has increased workloads slowly each 30 days due to deconditioning.  Rebecca Mcintyre is up to level 4 on the nustep, and level 1.5 on the recumbent bike.  She enjoys exercising in pulmonary rehab and does feel she is stronger, she will graduate this week.    Expected Outcomes  -  See admission goals  Will continue to increase workloads as tolerated and learn to exercise safely on her own at graduation date of 04-01-2019  For Leva to continue exercising on her own after graduating with the tools we have provided her.        Exercise Goals and Review: Exercise Goals    Row Name 01/28/19 1613 02/15/19 0912 03/16/19 0712 04/13/19 0804       Exercise Goals  Increase Physical Activity  Yes  Yes  Yes  Yes    Intervention  Provide advice, education, support and counseling about physical activity/exercise needs.;Develop an individualized exercise prescription for aerobic and resistive training based on initial evaluation findings, risk stratification, comorbidities and participant's personal goals.  Provide advice, education, support and counseling about physical activity/exercise needs.;Develop an individualized exercise prescription for aerobic and resistive training based on initial evaluation findings, risk stratification, comorbidities and participant's personal goals.  Provide advice, education, support and counseling about physical activity/exercise needs.;Develop an individualized exercise prescription for aerobic and resistive training based on initial evaluation findings, risk stratification, comorbidities and participant's personal goals.  Provide advice, education, support and counseling about physical activity/exercise needs.;Develop an individualized exercise prescription for aerobic and resistive training based on initial evaluation findings, risk stratification, comorbidities and participant's personal goals.    Expected Outcomes  Short Term: Attend rehab on a regular basis to increase amount of physical activity.;Long Term: Add in home exercise to make exercise part of routine and to increase amount of physical activity.;Long Term: Exercising regularly at least 3-5 days a week.  Short Term: Attend rehab on a regular basis to increase amount of physical activity.;Long Term: Add in home exercise to make exercise part of routine and to increase amount of physical activity.;Long Term: Exercising regularly at least 3-5 days a week.  Short Term: Attend rehab on a regular basis to increase amount of physical  activity.;Long Term: Add in home exercise to make exercise part of routine and to increase amount of physical activity.;Long Term: Exercising regularly at least 3-5 days a week.  Short Term: Attend rehab on a regular basis to increase amount of physical activity.;Long Term: Add in home exercise to make exercise part of routine and to increase amount of physical activity.;Long Term: Exercising regularly at least 3-5 days a week.    Increase Strength and Stamina  Yes  Yes  Yes  Yes    Intervention  Provide advice, education, support and counseling about physical activity/exercise needs.;Develop an individualized exercise prescription for aerobic and resistive training based on initial evaluation findings, risk stratification, comorbidities and participant's personal goals.  Provide advice, education, support and counseling about physical activity/exercise needs.;Develop an individualized exercise prescription for aerobic and resistive training based on initial evaluation findings, risk stratification, comorbidities and participant's personal goals.  Provide advice, education, support and counseling about physical activity/exercise needs.;Develop an individualized exercise prescription for aerobic and resistive training based on initial evaluation findings, risk stratification, comorbidities and participant's personal goals.  Provide advice, education, support and counseling about physical activity/exercise needs.;Develop an individualized exercise prescription for aerobic and resistive training based on initial evaluation findings, risk stratification, comorbidities and participant's personal goals.    Expected Outcomes  Short Term: Increase workloads from initial exercise prescription for resistance, speed, and METs.;Short Term: Perform resistance training exercises routinely during rehab and add in resistance training at home;Long Term: Improve cardiorespiratory fitness, muscular endurance and strength as  measured by increased METs and functional capacity (6MWT)  Short Term: Increase workloads from initial exercise prescription for resistance, speed, and METs.;Short Term: Perform resistance training exercises routinely during rehab and add in resistance training at home;Long Term: Improve cardiorespiratory fitness, muscular endurance and strength as measured by increased METs and functional capacity (6MWT)  Short Term: Increase workloads from initial exercise prescription for resistance, speed, and METs.;Short Term: Perform resistance training exercises routinely during rehab and add in resistance training at home;Long Term: Improve cardiorespiratory fitness, muscular endurance and strength as  measured by increased METs and functional capacity (6MWT)  Short Term: Increase workloads from initial exercise prescription for resistance, speed, and METs.;Short Term: Perform resistance training exercises routinely during rehab and add in resistance training at home;Long Term: Improve cardiorespiratory fitness, muscular endurance and strength as measured by increased METs and functional capacity (6MWT)    Able to understand and use rate of perceived exertion (RPE) scale  Yes  Yes  Yes  Yes    Intervention  Provide education and explanation on how to use RPE scale  Provide education and explanation on how to use RPE scale  Provide education and explanation on how to use RPE scale  Provide education and explanation on how to use RPE scale    Expected Outcomes  Short Term: Able to use RPE daily in rehab to express subjective intensity level;Long Term:  Able to use RPE to guide intensity level when exercising independently  Short Term: Able to use RPE daily in rehab to express subjective intensity level;Long Term:  Able to use RPE to guide intensity level when exercising independently  Short Term: Able to use RPE daily in rehab to express subjective intensity level;Long Term:  Able to use RPE to guide intensity level when  exercising independently  Short Term: Able to use RPE daily in rehab to express subjective intensity level;Long Term:  Able to use RPE to guide intensity level when exercising independently    Able to understand and use Dyspnea scale  Yes  Yes  Yes  Yes    Intervention  Provide education and explanation on how to use Dyspnea scale  Provide education and explanation on how to use Dyspnea scale  Provide education and explanation on how to use Dyspnea scale  Provide education and explanation on how to use Dyspnea scale    Expected Outcomes  Short Term: Able to use Dyspnea scale daily in rehab to express subjective sense of shortness of breath during exertion;Long Term: Able to use Dyspnea scale to guide intensity level when exercising independently  Short Term: Able to use Dyspnea scale daily in rehab to express subjective sense of shortness of breath during exertion;Long Term: Able to use Dyspnea scale to guide intensity level when exercising independently  Short Term: Able to use Dyspnea scale daily in rehab to express subjective sense of shortness of breath during exertion;Long Term: Able to use Dyspnea scale to guide intensity level when exercising independently  Short Term: Able to use Dyspnea scale daily in rehab to express subjective sense of shortness of breath during exertion;Long Term: Able to use Dyspnea scale to guide intensity level when exercising independently    Knowledge and understanding of Target Heart Rate Range (THRR)  Yes  Yes  Yes  Yes    Expected Outcomes  Short Term: Able to state/look up THRR;Short Term: Able to use daily as guideline for intensity in rehab;Long Term: Able to use THRR to govern intensity when exercising independently  Short Term: Able to state/look up THRR;Short Term: Able to use daily as guideline for intensity in rehab;Long Term: Able to use THRR to govern intensity when exercising independently  Short Term: Able to state/look up THRR;Short Term: Able to use daily as  guideline for intensity in rehab;Long Term: Able to use THRR to govern intensity when exercising independently  Short Term: Able to state/look up THRR;Short Term: Able to use daily as guideline for intensity in rehab;Long Term: Able to use THRR to govern intensity when exercising independently    Understanding of  Exercise Prescription  Yes  Yes  Yes  Yes    Intervention  Provide education, explanation, and written materials on patient's individual exercise prescription  Provide education, explanation, and written materials on patient's individual exercise prescription  Provide education, explanation, and written materials on patient's individual exercise prescription  Provide education, explanation, and written materials on patient's individual exercise prescription    Expected Outcomes  Short Term: Able to explain program exercise prescription;Long Term: Able to explain home exercise prescription to exercise independently  Short Term: Able to explain program exercise prescription;Long Term: Able to explain home exercise prescription to exercise independently  Short Term: Able to explain program exercise prescription;Long Term: Able to explain home exercise prescription to exercise independently  Short Term: Able to explain program exercise prescription;Long Term: Able to explain home exercise prescription to exercise independently       Exercise Goals Re-Evaluation: Exercise Goals Re-Evaluation    Row Name 02/15/19 0913 03/16/19 7414 04/13/19 0805         Exercise Goal Re-Evaluation   Exercise Goals Review  Increase Physical Activity;Increase Strength and Stamina;Able to understand and use rate of perceived exertion (RPE) scale;Able to understand and use Dyspnea scale;Knowledge and understanding of Target Heart Rate Range (THRR);Understanding of Exercise Prescription  Increase Physical Activity;Increase Strength and Stamina;Able to understand and use rate of perceived exertion (RPE) scale;Able to  understand and use Dyspnea scale;Knowledge and understanding of Target Heart Rate Range (THRR);Understanding of Exercise Prescription  Increase Physical Activity;Increase Strength and Stamina;Able to understand and use rate of perceived exertion (RPE) scale;Able to understand and use Dyspnea scale;Knowledge and understanding of Target Heart Rate Range (THRR);Understanding of Exercise Prescription     Comments  Pt has completed 3 exercise sessions. Pt has a positive attitude and is motivated to adapt a more active lifestyle.Pt currently exercises at 2.4 METs on the stepper. Will continue to monitor and progress as able.  Pt has completed 9 exercise sessions. Pt still has a positive lifestyle and has become more active on her own. Progress has been slow, and she is still exercising at 2.4 METs on the stepper. Will continue to monitor and progress as able.  Pt has completed 14 exercise sessions. Pt recently had a COPD exacerbation, so she missed the last two sessions. Pt has been exercising at 2.4 METs on the stepper. Will continue to monitor and progess as able.     Expected Outcomes  Through exercise at rehab and at home, the patient will decrease shortness of breath with daily activities and feel confident in carrying out an exercise regime at home.  Through exercise at rehab and at home, the patient will decrease shortness of breath with daily activities and feel confident in carrying out an exercise regime at home.  Through exercise at rehab and at home, the patient will decrease shortness of breath with daily activities and feel confident in carrying out an exercise regime at home.        Nutrition & Weight - Outcomes: Pre Biometrics - 01/28/19 1504      Pre Biometrics   Height  _0  (1.6 m)    Weight  95.3 kg    BMI (Calculated)  37.23    Grip Strength  32 kg        Nutrition:   Nutrition Discharge:   Education Questionnaire Score: Knowledge Questionnaire Score - 01/28/19 1541       Knowledge Questionnaire Score   Pre Score  16/18  Goals reviewed with patient; copy given to patient.

## 2019-05-04 DIAGNOSIS — E119 Type 2 diabetes mellitus without complications: Secondary | ICD-10-CM | POA: Diagnosis not present

## 2019-05-04 DIAGNOSIS — H52202 Unspecified astigmatism, left eye: Secondary | ICD-10-CM | POA: Diagnosis not present

## 2019-05-04 LAB — HM DIABETES EYE EXAM

## 2019-05-10 ENCOUNTER — Encounter: Payer: Self-pay | Admitting: Internal Medicine

## 2019-05-14 DIAGNOSIS — J449 Chronic obstructive pulmonary disease, unspecified: Secondary | ICD-10-CM | POA: Diagnosis not present

## 2019-05-14 DIAGNOSIS — I5033 Acute on chronic diastolic (congestive) heart failure: Secondary | ICD-10-CM | POA: Diagnosis not present

## 2019-06-03 ENCOUNTER — Other Ambulatory Visit: Payer: Self-pay | Admitting: Acute Care

## 2019-06-14 DIAGNOSIS — I5033 Acute on chronic diastolic (congestive) heart failure: Secondary | ICD-10-CM | POA: Diagnosis not present

## 2019-06-14 DIAGNOSIS — J449 Chronic obstructive pulmonary disease, unspecified: Secondary | ICD-10-CM | POA: Diagnosis not present

## 2019-06-17 ENCOUNTER — Other Ambulatory Visit: Payer: Self-pay | Admitting: Internal Medicine

## 2019-06-18 ENCOUNTER — Other Ambulatory Visit: Payer: Self-pay | Admitting: Internal Medicine

## 2019-06-24 ENCOUNTER — Other Ambulatory Visit: Payer: Self-pay | Admitting: Internal Medicine

## 2019-07-15 DIAGNOSIS — J449 Chronic obstructive pulmonary disease, unspecified: Secondary | ICD-10-CM

## 2019-07-15 DIAGNOSIS — J9612 Chronic respiratory failure with hypercapnia: Secondary | ICD-10-CM

## 2019-07-15 DIAGNOSIS — I5033 Acute on chronic diastolic (congestive) heart failure: Secondary | ICD-10-CM | POA: Diagnosis not present

## 2019-07-15 DIAGNOSIS — J9611 Chronic respiratory failure with hypoxia: Secondary | ICD-10-CM

## 2019-07-19 NOTE — Telephone Encounter (Signed)
lmtcb X1 for pt  

## 2019-07-19 NOTE — Telephone Encounter (Signed)
RA please advise if you're ok with [place] switching to [facility] at pt's request.  Thanks!   I would like to change my oxygen supplier from [employer] to [facility] at [phone]  fax [phone] This would be the entire equipment that [employer] had provided  would be switched to [facility] who I'm hoping will be a better experience.  Please let me know of any issues. I'm sorry to bother you with this but your office has to be involved for me to change.  Feel free to contact me.

## 2019-07-20 ENCOUNTER — Other Ambulatory Visit: Payer: Self-pay

## 2019-07-20 ENCOUNTER — Ambulatory Visit (INDEPENDENT_AMBULATORY_CARE_PROVIDER_SITE_OTHER): Payer: Medicare Other | Admitting: Primary Care

## 2019-07-20 ENCOUNTER — Encounter: Payer: Self-pay | Admitting: Primary Care

## 2019-07-20 DIAGNOSIS — J0100 Acute maxillary sinusitis, unspecified: Secondary | ICD-10-CM | POA: Diagnosis not present

## 2019-07-20 DIAGNOSIS — J441 Chronic obstructive pulmonary disease with (acute) exacerbation: Secondary | ICD-10-CM

## 2019-07-20 MED ORDER — AMOXICILLIN-POT CLAVULANATE 875-125 MG PO TABS: 1.0000 | ORAL_TABLET | Freq: Two times a day (BID) | ORAL | 0 refills | Status: DC

## 2019-07-20 MED ORDER — PREDNISONE 10 MG PO TABS: ORAL_TABLET | ORAL | 0 refills | Status: DC

## 2019-07-20 NOTE — Patient Instructions (Signed)
  Acute maxillary sinusitis/COPD exacerbation: - RX Augmentin 1 tab twice daily x 7 dyas and prednisone 38m x 5 days  - Continue Anoro 1 puff daily; prn ventolin hfa q 4-6 hours for sob/wheezing - Continue Singulair 127mat bedtime (spring/fall) and mucinex [id number] daily  - Return/call office if symptoms do not improve or worsen  Follow-up: - March 11th

## 2019-07-20 NOTE — Progress Notes (Signed)
Virtual Visit via Telephone Note  I connected with Rebecca Mcintyre on 07/20/19 at  9:30 AM EST by telephone and verified that I am speaking with the correct person using two identifiers.  Location: Patient: Home Provider: Office   I discussed the limitations, risks, security and privacy concerns of performing an evaluation and management service by telephone and the availability of in person appointments. I also discussed with the patient that there may be a patient responsible charge related to this service. The patient expressed understanding and agreed to proceed.   History of Present Illness: 74 year old female, former smoker (39 pack year hx). PMH significant for suspected COPD, CAD, diastolic HF, tobacco abuse, dyslipidemia, obesity and pulmonary hypertension. Patient of Dr. [name], last seen in October 2020. Maintained on Anoro and prn Ventolin. Participating in pulmonary rehab.   Previous [facility] pulmonary encounters: 12/22/2018 Patient contacted today for televist visit with complaints of chills with no fever, night sweats, fatigue, cough, sob and nasal congestion x 4 days. Increased oxygen to 2-3L. Continues Anoro as prescribed, requiring rescue inhaler 1-2 times a day. Covid test prior to Sleep study negative. She did have possible second hand exposure to covid through her daughter on 7/22. Tmax 98.4. Sleep study on 12/13/18 which showed no obstructive sleep apnea, min O2 89%. Periodic limb movements did occur during sleep. CXR today showed stable findings of emphysema/chronic bronchitis. No acute superimposed findings identified. Treated for bronchitis, presumed underlying COPD, with doxycycline 1 tab BID x 7 days and prednisone 5m x 5 days  12/29/2018 Patient presents today for PFTs. Covid testing was negative. PFTs today showed moderate-severe obstructive lung disease. Her breathing is a lot better after being treated for an acute exacerbation with doxycycline and prednisone x 5 days. Continues  using anoro 1 puff daily. Rare rescue inhaler use. Asking if she needs to continue lasix. Will check BMET and BNP. Sleep study showed nor OSA but periodic limb movement. States that she sleeps well.Trial requip for restless leg syndrome. Refer to pulmonary rehab.   03/03/19 - COPD follow-up with Dr. AElsworth SohoDoing well. One flare since hospital discharge which resolved with antibiotics. Attending pulmonary rehab which has helped, using oxygen as needed basis during daytime and occasionally at night. She did have PLMS on her sleep study but does not have symptoms of restless leg syndrome. Ok to stop requip. Takes lasix for pedal edema. Continue Anoro. Take Singulair during spring and fall. FU in 4 months   07/20/2019 Patient contacted today for acute visit. Reports not feeling well for the last several days. Reports post nasal drip, sinus congestion/ pressure and labored breathing for 3-4 days. She has been using her Anoro once daily as prescribed. Requiring Ventolin rescue inhaler several times a day with little improvement. She recently started taking Singulair again last week. Uses oxygen prn during the day and when exercising. She is compliant with her lasix. No inscresed leg swelling or pedal edema. Reports some weight gain but feels it is more due to life style/food choices than fluid retention. Denies chest tightness, wheezing or cough. She has received covid vaccine.    Observations/Objective:  Patient reported: O2 85-88% RA; 93-94% 2L  Significant tests/ events reviewed  PFTs: 12/29/2018- FVC 2.31 (82), FEV1 1.20 (56), ratio 52, no bronchodilatory response, DLCO 51 %  Imaging: 6/10/20CTAChest-No PE; cardiomegaly andemphysematous changes of the lung  Sleep study: NPSG 12/13/18  no obstructive sleep apnea, min O2 89%. Periodic limb movements did occur during sleep  Assessment and Plan:  Acute maxillary sinusitis/COPD exacerbation: - Sinus congestion/pressure, PND and labored  breathing x 3-4 days  - RX Augmentin 1 tab twice daily x 7 dyas and prednisone 4m x 5 days  - Continue Anoro 1 puff daily; prn ventolin hfa q 4-6 hours for sob/wheezing - Continue Singulair 174mat bedtime (spring/fall) and mucinex [id number] daily  - Return/call office if symptoms do not improve or worsen  Follow Up Instructions:   - She has a scheduled follow-up visit on March 11th   I discussed the assessment and treatment plan with the patient. The patient was provided an opportunity to ask questions and all were answered. The patient agreed with the plan and demonstrated an understanding of the instructions.   The patient was advised to call back or seek an in-person evaluation if the symptoms worsen or if the condition fails to improve as anticipated.  I provided 22 minutes of non-face-to-face time during this encounter.   EliMartyn EhrichP

## 2019-07-23 NOTE — Telephone Encounter (Signed)
Okay to switch?  

## 2019-07-23 NOTE — Telephone Encounter (Signed)
Order has been placed for pt to have DME changed from [employer] to [facility]. Nothing further needed.

## 2019-07-26 ENCOUNTER — Telehealth: Payer: Self-pay | Admitting: Pulmonary Disease

## 2019-07-26 NOTE — Telephone Encounter (Signed)
Per email from patient  Marlaine, Arey to Rigoberto Noel, MD     3:56 PM I would like to change my oxygen supplier from [employer] to [facility] at [phone]  fax [phone] This would be the entire equipment that [employer] had provided  would be switched to [facility] is [employer] and per Jeneen Rinks from [employer] they can't take the patient. She is 8 months into rental with [employer]

## 2019-08-03 ENCOUNTER — Other Ambulatory Visit: Payer: Self-pay

## 2019-08-03 NOTE — Progress Notes (Signed)
This visit occurred during the SARS-CoV-2 public health emergency.  Safety protocols were in place, including screening questions prior to the visit, additional usage of staff PPE, and extensive cleaning of exam room while observing appropriate contact time as indicated for disinfecting solutions.    Chief Complaint  Patient presents with  . Follow-up    Pt is here for 6 month follow up on diabetes and no other concerns today     HPI: Rebecca Mcintyre 74 y.o. come in for Chronic disease management    rx sinus  2 23 pulmonary   Had ha after covid vaccine but doing fine   Sleeping with night O2   And prn up and around  No cough now and better   Has gained weight . Over the past 9 months   Working on weight loss again   Taking metformin  No numnbess vision changes  Bp has been ok   No new cv sx   No gout attack  recenet   ROS: See pertinent positives and negatives per HPI.  Past Medical History:  Diagnosis Date  . Allergy   . ASCUS on Pap smear    had repeat x 2 normal with [name] Plan 2011  . Coronary artery disease   . Gout   . Hyperlipidemia   . Hypertension   . Myocardial infarction [facility])    2002 95% proximal LAD stenosis with thrombus followed by 90% stenosis, the first diagonal 80% stenosis, circumflex 30% stenosis, circumflex obtuse marginal subbranch 70% stenosis, PDA 40% stenosis. She had a drug-eluting stent placement with a Pixel stent  . Osteopenia   . Solitary kidney     Family History  Problem Relation Age of Onset  . Breast cancer Mother 70  . Lung cancer Father   . Heart attack Brother        Died age 7 MI  . Colon cancer Brother   . Heart disease Daughter 73       smoker and overweight   . Esophageal cancer Neg Hx   . Pancreatic cancer Neg Hx   . Rectal cancer Neg Hx   . Stomach cancer Neg Hx     Social History   Socioeconomic History  . Marital status: Single    Spouse name: Not on file  . Number of children: 2  . Years of education:  Not on file  . Highest education level: Not on file  Occupational History    Employer: [employer]  Tobacco Use  . Smoking status: Former Smoker    Packs/day: 1.00    Years: 39.00    Pack years: 39.00    Types: Cigarettes    Quit date: 11/04/2018    Years since quitting: 0.7  . Smokeless tobacco: Never Used  . Tobacco comment: 39  Substance and Sexual Activity  . Alcohol use: Yes    Comment: 1-2 per month  . Drug use: No  . Sexual activity: Not on file  Other Topics Concern  . 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    No pets      Social Determinants of Radio broadcast assistant Strain:   . Difficulty of Paying Living Expenses: Not on file  Food Insecurity:   . Worried About Charity fundraiser in the Last Year: Not  on file  . Ran Out of Food in the Last Year: Not on file  Transportation Needs:   . Lack of Transportation (Medical): Not on file  . Lack of Transportation (Non-Medical): Not on file  Physical Activity:   . Days of Exercise per Week: Not on file  . Minutes of Exercise per Session: Not on file  Stress:   . Feeling of Stress : Not on file  Social Connections:   . Frequency of Communication with Friends and Family: Not on file  . Frequency of Social Gatherings with Friends and Family: Not on file  . Attends Religious Services: Not on file  . Active Member of Clubs or Organizations: Not on file  . Attends Archivist Meetings: Not on file  . Marital Status: Not on file    Outpatient Medications Prior to Visit  Medication Sig Dispense Refill  . acetaminophen (TYLENOL) 500 MG tablet Take 1,000 mg by mouth every 6 (six) hours as needed for mild pain.    [name] albuterol (VENTOLIN HFA) 108 (90 Base) MCG/ACT inhaler Inhale 2 puffs into the lungs every 6 (six) hours as needed for wheezing or shortness of breath. 18 g 3  .  allopurinol (ZYLOPRIM) 100 MG tablet Take 1 tablet (100 mg total) by mouth 2 (two) times daily. Take 180 tablet 1  . ANORO ELLIPTA 62.5-25 MCG/INH AEPB TAKE 1 PUFF BY MOUTH EVERY DAY 60 each 5  . aspirin 81 MG tablet Take 81 mg by mouth daily.    . furosemide (LASIX) 40 MG tablet TAKE 1 TABLET BY MOUTH EVERY DAY 90 tablet 1  . guaiFENesin (MUCINEX) 600 MG 12 hr tablet Take by mouth 2 (two) times daily.    . hydrocortisone 2.5 % cream Apply topically 2 (two) times daily. 30 g 0  . ketoconazole (NIZORAL) 2 % cream Apply 1 application topically 2 (two) times daily. 30 g 0  . KLOR-CON M20 20 MEQ tablet TAKE 1 TABLET BY MOUTH EVERY DAY 30 tablet 2  . metFORMIN (GLUCOPHAGE) 500 MG tablet TAKE 1 TABLET (500 MG TOTAL) BY MOUTH 2 (TWO) TIMES DAILY WITH A MEAL. 180 tablet 1  . metoprolol tartrate (LOPRESSOR) 25 MG tablet TAKE 1/2 TABLETS BY MOUTH 2 TIMES DAILY 90 tablet 1  . montelukast (SINGULAIR) 10 MG tablet TAKE 1 TABLET (10 MG TOTAL) AT BEDTIME BY MOUTH. 90 tablet 0  . nitroGLYCERIN (NITROSTAT) 0.4 MG SL tablet Place 1 tablet (0.4 mg total) under the tongue every 5 (five) minutes as needed. 30 tablet 0  . rOPINIRole (REQUIP) 0.5 MG tablet Start 1/2 tab qhs x 2 nights; then increased to 1 tab qhs for restless leg 30 tablet 3  . simvastatin (ZOCOR) 40 MG tablet TAKE 1 TABLET BY MOUTH AT BEDTIME 90 tablet 1  . amoxicillin-clavulanate (AUGMENTIN) 875-125 MG tablet Take 1 tablet by mouth 2 (two) times daily. (Patient not taking: Reported on 08/04/2019) 14 tablet 0  . predniSONE (DELTASONE) 10 MG tablet Take 2 tabs x 5 days (Patient not taking: Reported on 08/04/2019) 10 tablet 0   No facility-administered medications prior to visit.     EXAM:  BP 116/60 (BP Location: Right Arm, Patient Position: Sitting, Cuff Size: Normal)   Pulse 80   Temp 97.8 F (36.6 C) (Temporal)   Wt 229 lb 6.4 oz (104.1 kg)   SpO2 94%   BMI 40.64 kg/m   Body mass index is 40.64 kg/m. Wt Readings from Last 3 Encounters:    08/04/19 229  lb 6.4 oz (104.1 kg)  04/02/19 217 lb 6.4 oz (98.6 kg)  03/30/19 219 lb 5.7 oz (99.5 kg)    GENERAL: vitals reviewed and listed above, alert, oriented, appears well hydrated and in no acute distress HEENT: atraumatic, conjunctiva  clear, no obvious abnormalities on inspection of external nose and ears OP :masked   NECK: no obvious masses on inspection palpation  LUNGS: clear to auscultation bilaterally, no wheezes, rales or rhonchi,  CV: HRRR, no clubbing cyanosis puffy ankles nl cap refill ( says hasent taken lasix today)  MS: moves all extremities without noticeable focal  abnormality independent gait  PSYCH: pleasant and cooperative, no obvious depression or anxiety Lab Results  Component Value Date   WBC 8.8 01/01/2019   HGB 14.1 01/01/2019   HCT 42.5 01/01/2019   PLT 317 01/01/2019   GLUCOSE 98 02/05/2019   CHOL 185 02/05/2019   TRIG 226.0 (H) 02/05/2019   HDL 47.00 02/05/2019   LDLDIRECT 116.0 02/05/2019   LDLCALC 74 03/10/2018   ALT 34 11/12/2018   AST 31 11/12/2018   NA 141 02/05/2019   K 4.1 02/05/2019   CL 100 02/05/2019   CREATININE 0.80 02/05/2019   BUN 21 02/05/2019   CO2 30 02/05/2019   TSH 1.266 11/05/2018   HGBA1C 6.7 (A) 08/04/2019   MICROALBUR 0.7 04/02/2019   BP Readings from Last 3 Encounters:  08/04/19 116/60  04/02/19 130/70  03/03/19 126/70    ASSESSMENT AND PLAN:  Discussed the following assessment and plan:  Type 2 diabetes mellitus without complication, without long-term current use of insulin ([place]) - Plan: POCT glycosylated hemoglobin (Hb M5H), Basic metabolic panel, CBC with Differential/Platelet, Hemoglobin A1c, Hepatic function panel, Lipid panel, TSH, Microalbumin / creatinine urine ratio, Uric acid  Weight gain  Medication management - Plan: Basic metabolic panel, CBC with Differential/Platelet, Hemoglobin A1c, Hepatic function panel, Lipid panel, TSH, Microalbumin / creatinine urine ratio, Uric acid  Hyperlipidemia,  unspecified hyperlipidemia type - Plan: Basic metabolic panel, CBC with Differential/Platelet, Hemoglobin A1c, Hepatic function panel, Lipid panel, TSH, Microalbumin / creatinine urine ratio  Decreased hearing of both ears - Plan: Basic metabolic panel, CBC with Differential/Platelet, Hemoglobin A1c, Hepatic function panel, Lipid panel, TSH, Microalbumin / creatinine urine ratio  Essential hypertension - Plan: Basic metabolic panel, CBC with Differential/Platelet, Hemoglobin A1c, Hepatic function panel, Lipid panel, TSH, Microalbumin / creatinine urine ratio  History of gout - Plan: Basic metabolic panel, CBC with Differential/Platelet, Hemoglobin A1c, Hepatic function panel, Lipid panel, TSH, Microalbumin / creatinine urine ratio, Uric acid  MYOCARDIAL INFARCTION, HX OF - Plan: Basic metabolic panel, CBC with Differential/Platelet, Hemoglobin A1c, Hepatic function panel, Lipid panel, TSH, Microalbumin / creatinine urine ratio  Coronary artery disease involving native heart without angina pectoris, unspecified vessel or lesion type - Plan: Basic metabolic panel, CBC with Differential/Platelet, Hemoglobin A1c, Hepatic function panel, Lipid panel, TSH, Microalbumin / creatinine urine ratio plan labs  And cpx in September or thereabouts  Counseled. Weight control  bg etc  Outside external source  DATA REVIEWED:   pulm specialty   Total time on date  of service including record review ordering and plan of care counseling :  30 minutes   Return for cpx in sept and lab pre visit .  -Patient advised to return or notify health care team  if  new concerns arise. In interim   Patient Instructions  a1c is stable .   Continue  Working  on weight loss .   cpx and lab  pre visit in September or thereabouts   Standley Brooking. Aydrian Halpin M.D.

## 2019-08-04 ENCOUNTER — Ambulatory Visit (INDEPENDENT_AMBULATORY_CARE_PROVIDER_SITE_OTHER): Payer: Medicare Other | Admitting: Internal Medicine

## 2019-08-04 ENCOUNTER — Ambulatory Visit: Payer: Medicare Other | Admitting: Pulmonary Disease

## 2019-08-04 ENCOUNTER — Encounter: Payer: Self-pay | Admitting: Pulmonary Disease

## 2019-08-04 ENCOUNTER — Encounter: Payer: Self-pay | Admitting: Internal Medicine

## 2019-08-04 VITALS — BP 116/60 | HR 80 | Temp 97.8°F | Wt 229.4 lb

## 2019-08-04 DIAGNOSIS — E785 Hyperlipidemia, unspecified: Secondary | ICD-10-CM

## 2019-08-04 DIAGNOSIS — R635 Abnormal weight gain: Secondary | ICD-10-CM

## 2019-08-04 DIAGNOSIS — I5022 Chronic systolic (congestive) heart failure: Secondary | ICD-10-CM | POA: Insufficient documentation

## 2019-08-04 DIAGNOSIS — J432 Centrilobular emphysema: Secondary | ICD-10-CM

## 2019-08-04 DIAGNOSIS — I252 Old myocardial infarction: Secondary | ICD-10-CM

## 2019-08-04 DIAGNOSIS — J9611 Chronic respiratory failure with hypoxia: Secondary | ICD-10-CM | POA: Diagnosis not present

## 2019-08-04 DIAGNOSIS — Z79899 Other long term (current) drug therapy: Secondary | ICD-10-CM

## 2019-08-04 DIAGNOSIS — J9612 Chronic respiratory failure with hypercapnia: Secondary | ICD-10-CM | POA: Diagnosis not present

## 2019-08-04 DIAGNOSIS — H9193 Unspecified hearing loss, bilateral: Secondary | ICD-10-CM

## 2019-08-04 DIAGNOSIS — E119 Type 2 diabetes mellitus without complications: Secondary | ICD-10-CM

## 2019-08-04 DIAGNOSIS — I10 Essential (primary) hypertension: Secondary | ICD-10-CM

## 2019-08-04 DIAGNOSIS — G2581 Restless legs syndrome: Secondary | ICD-10-CM | POA: Diagnosis not present

## 2019-08-04 DIAGNOSIS — Z8739 Personal history of other diseases of the musculoskeletal system and connective tissue: Secondary | ICD-10-CM

## 2019-08-04 DIAGNOSIS — I2510 Atherosclerotic heart disease of native coronary artery without angina pectoris: Secondary | ICD-10-CM

## 2019-08-04 DIAGNOSIS — Z7189 Other specified counseling: Secondary | ICD-10-CM | POA: Insufficient documentation

## 2019-08-04 LAB — POCT GLYCOSYLATED HEMOGLOBIN (HGB A1C): Hemoglobin A1C: 6.7 % — AB (ref 4.0–5.6)

## 2019-08-04 NOTE — Progress Notes (Signed)
Cardiology Office Note   Date:  08/05/2019   ID:  Rebecca Mcintyre, DOB [birth year], MRN [id number]  PCP:  Burnis Medin, MD  Cardiologist:   No primary care provider on file.   Chief Complaint  Patient presents with  . Coronary Artery Disease      History of Present Illness: Rebecca Mcintyre is a 74 y.o. female who presents for evaluation of CAD. I saw her in 2014.  She has a history of an anterior MI in 2002.   She had DES to the LAD.    She was in the hospital after syncopal episode in June 2020.    She had acute hypoxic respiratory failure.  She had acute on chronic diastolic dysfunction.  She had acute cor pulmonale.  She did require some diuresis.  She had an echocardiogram which demonstrated well-preserved left ventricular function with some suggestion of diastolic dysfunction.  There was elevated pulmonary pressures noted.  She had moderate RV dysfunction.  She did have diuresis of 10.2 L.  She was sent home on 40 mg of Lasix.  She was also sent home on home O2 and pulmonary meds as listed.  She did have a follow-up echo.  Her pulmonary pressures seem to be reduced and RV function improved.    Since I last saw her she has done well.  The patient denies any new symptoms such as chest discomfort, neck or arm discomfort. There has been no new shortness of breath, PND or orthopnea. There have been no reported palpitations, presyncope or syncope.   She is active.  She would like to come off of the Lasix.  She does have chronic lung disease and wears O2 at night only.    She reports that she is no longer having significant shortness of breath that she was at the time of presentation.  She is not having any new swelling.  She is in no weight gain or edema.  Has not had any chest pressure, neck or arm discomfort.  She is not had any further syncope.  She denies any palpitations, presyncope or syncope.   Past Medical History:  Diagnosis Date  . Allergy   . ASCUS on Pap smear    had repeat x  2 normal with [name] Plan 2011  . Coronary artery disease   . Gout   . Hyperlipidemia   . Hypertension   . Myocardial infarction [facility])    2002 95% proximal LAD stenosis with thrombus followed by 90% stenosis, the first diagonal 80% stenosis, circumflex 30% stenosis, circumflex obtuse marginal subbranch 70% stenosis, PDA 40% stenosis. She had a drug-eluting stent placement with a Pixel stent  . Osteopenia   . Solitary kidney     Past Surgical History:  Procedure Laterality Date  . CARDIAC CATHETERIZATION    . CORONARY ANGIOPLASTY    . CORONARY ANGIOPLASTY WITH STENT PLACEMENT  2002  . [place]  2011  . LAD Stent angioplasty  2002  . Right oophorectomy with salpingectomy     benign growth Dr. Joneen Caraway     Current Outpatient Medications  Medication Sig Dispense Refill  . acetaminophen (TYLENOL) 500 MG tablet Take 1,000 mg by mouth every 6 (six) hours as needed for mild pain.    [name] albuterol (VENTOLIN HFA) 108 (90 Base) MCG/ACT inhaler Inhale 2 puffs into the lungs every 6 (six) hours as needed for wheezing or shortness of breath. 18 g 3  . allopurinol (ZYLOPRIM) 100 MG tablet Take  1 tablet (100 mg total) by mouth 2 (two) times daily. Take 180 tablet 1  . ANORO ELLIPTA 62.5-25 MCG/INH AEPB TAKE 1 PUFF BY MOUTH EVERY DAY 60 each 5  . aspirin 81 MG tablet Take 81 mg by mouth daily.    . furosemide (LASIX) 40 MG tablet TAKE 1 TABLET BY MOUTH EVERY DAY 90 tablet 1  . guaiFENesin (MUCINEX) 600 MG 12 hr tablet Take by mouth 2 (two) times daily.    . hydrocortisone 2.5 % cream Apply topically 2 (two) times daily. 30 g 0  . ketoconazole (NIZORAL) 2 % cream Apply 1 application topically 2 (two) times daily. 30 g 0  . KLOR-CON M20 20 MEQ tablet TAKE 1 TABLET BY MOUTH EVERY DAY 30 tablet 2  . metFORMIN (GLUCOPHAGE) 500 MG tablet TAKE 1 TABLET (500 MG TOTAL) BY MOUTH 2 (TWO) TIMES DAILY WITH A MEAL. 180 tablet 1  . metoprolol tartrate (LOPRESSOR) 25 MG tablet TAKE 1/2 TABLETS BY MOUTH  2 TIMES DAILY 90 tablet 1  . montelukast (SINGULAIR) 10 MG tablet TAKE 1 TABLET (10 MG TOTAL) AT BEDTIME BY MOUTH. 90 tablet 0  . nitroGLYCERIN (NITROSTAT) 0.4 MG SL tablet Place 1 tablet (0.4 mg total) under the tongue every 5 (five) minutes as needed. 30 tablet 0  . rOPINIRole (REQUIP) 0.5 MG tablet Start 1/2 tab qhs x 2 nights; then increased to 1 tab qhs for restless leg 30 tablet 3  . simvastatin (ZOCOR) 40 MG tablet TAKE 1 TABLET BY MOUTH AT BEDTIME 90 tablet 1   No current facility-administered medications for this visit.    Allergies:   Atorvastatin    ROS:  Please see the history of present illness.   Otherwise, review of systems are positive for none.   All other systems are reviewed and negative.    PHYSICAL EXAM: VS:  BP 120/75   Pulse 76   Temp (!) 93.2 F (34 C)   Ht _0  (1.6 m)   Wt 227 lb 9.6 oz (103.2 kg)   SpO2 (!) 87%   BMI 40.32 kg/m  , BMI Body mass index is 40.32 kg/m. GENERAL:  Well appearing NECK:  No jugular venous distention, waveform within normal limits, carotid upstroke brisk and symmetric, no bruits, no thyromegaly LUNGS:  Clear to auscultation bilaterally CHEST:  Unremarkable HEART:  PMI not displaced or sustained,S1 and S2 within normal limits, no S3, no S4, no clicks, no rubs, no murmurs ABD:  Flat, positive bowel sounds normal in frequency in pitch, no bruits, no rebound, no guarding, no midline pulsatile mass, no hepatomegaly, no splenomegaly EXT:  2 plus pulses throughout, mild edema, no cyanosis no clubbing    EKG:  EKG is  ordered today. Sinus rhythm, rate 76, axis within normal limits, intervals within normal limits, poor anterior R wave progression, no acute ST-T wave changes.     Recent Labs: 11/04/2018: B Natriuretic Peptide 586.9 11/05/2018: TSH 1.266 11/12/2018: ALT 34 11/26/2018: Magnesium 2.2 12/29/2018: Pro B Natriuretic peptide (BNP) 109.0 01/01/2019: Hemoglobin 14.1; Platelets 317 02/05/2019: BUN 21; Creatinine, Ser 0.80;  Potassium 4.1; Sodium 141    Lipid Panel    Component Value Date/Time   CHOL 185 02/05/2019 1105   TRIG 226.0 (H) 02/05/2019 1105   HDL 47.00 02/05/2019 1105   CHOLHDL 4 02/05/2019 1105   VLDL 45.2 (H) 02/05/2019 1105   LDLCALC 74 03/10/2018 0810   LDLDIRECT 116.0 02/05/2019 1105      Wt Readings from Last 3 Encounters:  08/05/19 227 lb 9.6 oz (103.2 kg)  08/04/19 231 lb 9.6 oz (105.1 kg)  08/04/19 229 lb 6.4 oz (104.1 kg)      Other studies Reviewed: Additional studies/ records that were reviewed today include: Labs Review of the above records demonstrates:  Please see elsewhere in the note.     ASSESSMENT AND PLAN:  CAD:     The patient has no new sypmtoms.  No further cardiovascular testing is indicated.  We will continue with aggressive risk reduction and meds as listed.  DYSLIPIDEMIA:     LDL was 116 but new labs are ordered.  Goal LDL is less than 70  HTN:  The blood pressure is at target.  No change in therapy.   DIABETES:  Her hemoglobin A1c was 6.7.  No change in therapy.   CHRONIC DIASTOLIC DYSFUNCTION:    She seems to be euvolemic.  I have suggested that she could start taking her Lasix 20 mg a day and then go down to none because she prefers to reduce her salt and fluid rather than take more medications.   COVID EDUCATION: She has received both of her vaccinations.   Current medicines are reviewed at length with the patient today.  The patient does not have concerns regarding medicines.  The following changes have been made:  None  Labs/ tests ordered today include: None  Orders Placed This Encounter  Procedures  . EKG 12-Lead     Disposition:   FU with me in 12 months.      Signed, Minus Breeding, MD  08/05/2019 1:01 PM    [place]

## 2019-08-04 NOTE — Progress Notes (Signed)
   Subjective:    Patient ID: SHAQUEL JOSEPHSON, female    DOB: 1945/09/03, 74 y.o.   MRN: [id number]  HPI  74 year old ex-smoker for follow-up of COPD on home oxygen   PMH - CAD, diastolic HF,  dyslipidemia, obesity   She was hospitalized 10/2018 for acute on chronic hypercarbic respiratory failure  Reviewed televisit 2/23-given Augmentin for sinusitis Feels well, attributes it to weather improving, uses oxygen only for severe exertion and during sleep Saturation 90% on room air today Uses albuterol MDI 4-5 times a week, Anoro seems to work well. Compliant with Lasix  Only needs Requip to 3 times a month for restless legs  She has received Covid vaccines  Significant tests/ events reviewed  PFTs8/08/2018 FVC 2.31 (82), FEV1 1.20 (56), ratio 52, no bronchodilatory response, DLCO 51 %  Imaging 6/10/20CTAChest-No PE; cardiomegaly andemphysematous changes of the lung  NPSG 12/13/18  no obstructive sleep apnea, min O2 89%. Periodic limb movements did occur during sleep  Review of Systems Patient denies significant dyspnea,cough, hemoptysis,  chest pain, palpitations, pedal edema, orthopnea, paroxysmal nocturnal dyspnea, lightheadedness, nausea, vomiting, abdominal or  leg pains      Objective:   Physical Exam  Gen. Pleasant, obese, in no distress ENT - no lesions, no post nasal drip Neck: No JVD, no thyromegaly, no carotid bruits Lungs: no use of accessory muscles, no dullness to percussion, decreased without rales or rhonchi  Cardiovascular: Rhythm regular, heart sounds  normal, no murmurs or gallops, no peripheral edema Musculoskeletal: No deformities, no cyanosis or clubbing , no tremors       Assessment & Plan:

## 2019-08-04 NOTE — Assessment & Plan Note (Signed)
Continue O2 during sleep and severe exertion

## 2019-08-04 NOTE — Assessment & Plan Note (Signed)
Recent flareup was related to sinusitis and appears to have resolved, completed Augmentin Continue Anoro

## 2019-08-04 NOTE — Assessment & Plan Note (Signed)
Requip mostly on a as needed basis

## 2019-08-04 NOTE — Patient Instructions (Signed)
Stay on Anoro

## 2019-08-04 NOTE — Patient Instructions (Addendum)
a1c is stable .   Continue  Working  on weight loss .   cpx and lab pre visit in September or thereabouts

## 2019-08-05 ENCOUNTER — Other Ambulatory Visit: Payer: Self-pay

## 2019-08-05 ENCOUNTER — Ambulatory Visit: Payer: Medicare Other | Admitting: Cardiology

## 2019-08-05 ENCOUNTER — Encounter: Payer: Self-pay | Admitting: Cardiology

## 2019-08-05 VITALS — BP 120/75 | HR 76 | Temp 93.2°F | Ht 63.0 in | Wt 227.6 lb

## 2019-08-05 DIAGNOSIS — I5022 Chronic systolic (congestive) heart failure: Secondary | ICD-10-CM

## 2019-08-05 DIAGNOSIS — E118 Type 2 diabetes mellitus with unspecified complications: Secondary | ICD-10-CM | POA: Diagnosis not present

## 2019-08-05 DIAGNOSIS — Z7189 Other specified counseling: Secondary | ICD-10-CM

## 2019-08-05 DIAGNOSIS — E785 Hyperlipidemia, unspecified: Secondary | ICD-10-CM | POA: Diagnosis not present

## 2019-08-05 DIAGNOSIS — I2510 Atherosclerotic heart disease of native coronary artery without angina pectoris: Secondary | ICD-10-CM | POA: Diagnosis not present

## 2019-08-05 DIAGNOSIS — I10 Essential (primary) hypertension: Secondary | ICD-10-CM | POA: Diagnosis not present

## 2019-08-05 NOTE — Patient Instructions (Signed)
Medication Instructions:  No changes *If you need a refill on your cardiac medications before your next appointment, please call your pharmacy*  Lab Work: None needed this visit  Testing/Procedures: None needed this visit  Follow-Up: At [facility], you and your health needs are our priority.  As part of our continuing mission to provide you with exceptional heart care, we have created designated Provider Care Teams.  These Care Teams include your primary Cardiologist (physician) and Advanced Practice Providers (APPs -  Physician Assistants and Nurse Practitioners) who all work together to provide you with the care you need, when you need it.  We recommend signing up for the patient portal called "MyChart".  Sign up information is provided on this After Visit Summary.  MyChart is used to connect with patients for Virtual Visits (Telemedicine).  Patients are able to view lab/test results, encounter notes, upcoming appointments, etc.  Non-urgent messages can be sent to your provider as well.   To learn more about what you can do with MyChart, go to [email or web address].    Your next appointment:   1 year(s)  You will receive a reminder letter in the mail two months in advance. If you don't receive a letter, please call our office to schedule the follow-up appointment.  The format for your next appointment:   In Person  Provider:   Minus Breeding, MD

## 2019-08-12 DIAGNOSIS — I5033 Acute on chronic diastolic (congestive) heart failure: Secondary | ICD-10-CM | POA: Diagnosis not present

## 2019-08-12 DIAGNOSIS — J449 Chronic obstructive pulmonary disease, unspecified: Secondary | ICD-10-CM | POA: Diagnosis not present

## 2019-08-18 ENCOUNTER — Other Ambulatory Visit: Payer: Self-pay | Admitting: Internal Medicine

## 2019-08-19 ENCOUNTER — Ambulatory Visit (INDEPENDENT_AMBULATORY_CARE_PROVIDER_SITE_OTHER): Payer: Medicare Other

## 2019-08-19 ENCOUNTER — Encounter: Payer: Self-pay | Admitting: Podiatry

## 2019-08-19 ENCOUNTER — Other Ambulatory Visit: Payer: Self-pay

## 2019-08-19 ENCOUNTER — Ambulatory Visit: Payer: Medicare Other | Admitting: Podiatry

## 2019-08-19 VITALS — BP 120/62 | HR 74 | Temp 98.0°F | Resp 14

## 2019-08-19 DIAGNOSIS — L601 Onycholysis: Secondary | ICD-10-CM

## 2019-08-19 DIAGNOSIS — S99922A Unspecified injury of left foot, initial encounter: Secondary | ICD-10-CM | POA: Diagnosis not present

## 2019-08-19 DIAGNOSIS — S90222A Contusion of left lesser toe(s) with damage to nail, initial encounter: Secondary | ICD-10-CM

## 2019-08-19 MED ORDER — CEPHALEXIN 500 MG PO CAPS: 500.0000 mg | ORAL_CAPSULE | Freq: Three times a day (TID) | ORAL | 0 refills | Status: DC

## 2019-08-19 NOTE — Progress Notes (Signed)
  Subjective: 74 year old female presents the office today for concerns of a left toe injury.  She has had 2 different injuries to her foot.  The first that she had on a storm door and then she tripped and fell on the concrete ledge.  On the second injuries when she entered the toenail she noticed blood on the nail.Denies any systemic complaints such as fevers, chills, nausea, vomiting. No acute changes since last appointment, and no other complaints at this time.   Objective: AAO x3, NAD DP/PT pulses palpable bilaterally, CRT less than 3 seconds Left hallux toenail is loose with underlying nail bed and there is blood along the periphery of the nail as well as underneath the toenail.  Tenderness palpation of the area.  There is no purulence.  No ascending cellulitis. No edema, erythema, increase in warmth to bilateral lower extremities.  No open lesions or pre-ulcerative lesions.  No pain with calf compression, swelling, warmth, erythema      Assessment: Left hallux toenail injury, hematoma  Plan: -All treatment options discussed with the patient including all alternatives, risks, complications.  -X-rays obtained reviewed.  No definitive evidence of acute fracture. -At this time, recommended total nail removal without chemical matricectomy to the left hallux toenail due to the injury. Risks and complications were discussed with the patient for which they understand and  verbally consent to the procedure. Under sterile conditions a total of 3 mL of a mixture of 2% lidocaine plain and 0.5% Marcaine plain was infiltrated in a hallux block fashion. Once anesthetized, the skin was prepped in sterile fashion. A tourniquet was then applied. Next the left hallux toenail nail was excised making sure to remove the entire offending nail border. Once the nail was removed, the area was debrided and the underlying skin was intact. No laceration was present. The area was irrigated and hemostasis was obtained.   A dry sterile dressing was applied. After application of the dressing the tourniquet was removed and there is found to be an immediate capillary refill time to the digit. The patient tolerated the procedure well any complications. Post procedure instructions were discussed the patient for which he verbally understood. Follow-up in one week for nail check or sooner if any problems are to arise. Discussed signs/symptoms of worsening infection and directed to call the office immediately should any occur or go directly to the emergency room. In the meantime, encouraged to call the office with any questions, concerns, changes symptoms. -Keflex -Patient encouraged to call the office with any questions, concerns, change in symptoms.   Trula Slade DPM

## 2019-08-19 NOTE — Patient Instructions (Signed)

## 2019-09-02 ENCOUNTER — Ambulatory Visit (INDEPENDENT_AMBULATORY_CARE_PROVIDER_SITE_OTHER): Payer: Medicare Other | Admitting: Podiatry

## 2019-09-02 ENCOUNTER — Other Ambulatory Visit: Payer: Self-pay

## 2019-09-02 ENCOUNTER — Encounter: Payer: Self-pay | Admitting: Podiatry

## 2019-09-02 VITALS — Temp 97.9°F

## 2019-09-02 DIAGNOSIS — S90222D Contusion of left lesser toe(s) with damage to nail, subsequent encounter: Secondary | ICD-10-CM

## 2019-09-02 DIAGNOSIS — G8929 Other chronic pain: Secondary | ICD-10-CM

## 2019-09-02 DIAGNOSIS — M79675 Pain in left toe(s): Secondary | ICD-10-CM

## 2019-09-12 DIAGNOSIS — I5033 Acute on chronic diastolic (congestive) heart failure: Secondary | ICD-10-CM | POA: Diagnosis not present

## 2019-09-12 DIAGNOSIS — J449 Chronic obstructive pulmonary disease, unspecified: Secondary | ICD-10-CM | POA: Diagnosis not present

## 2019-09-12 NOTE — Progress Notes (Signed)
Subjective: Rebecca Mcintyre is a 74 y.o.  female returns to office today for follow up evaluation after having left Hallux total nail avulsion performed.  She states that she is soaking soaking Epson salts, with antibiotic ointment and a bandage.  Still tender.  She gets some clear bloody drainage but no pus. Patient denies fevers, chills, nausea, vomiting. Denies any calf pain, chest pain, SOB.   Objective:  Vitals: Reviewed  General: Well developed, nourished, in no acute distress, alert and oriented x3   Dermatology: Skin is warm, dry and supple bilateral.  Left hallux nail bed appears to be clean, dry, with mild granular tissue and there is also a small hematoma that was present.  Was able to clean this today.  There is decreased edema and erythema to the toe.  There is still tenderness palpation.   Neurovascular status: Intact.  l.   Assesement and Plan: S/p total nail avulsion, doing well.   -Continue soaking in epsom salts twice a day followed by antibiotic ointment and a band-aid. Can leave uncovered at night. Continue this until completely healed.  -Monitor for any signs/symptoms of infection. Call the office immediately if any occur or go directly to the emergency room. Call with any questions/concerns.  RTC 2-3 weeks  Celesta Gentile, DPM

## 2019-09-14 ENCOUNTER — Other Ambulatory Visit: Payer: Self-pay | Admitting: Internal Medicine

## 2019-09-21 ENCOUNTER — Ambulatory Visit: Payer: Medicare Other | Admitting: Podiatry

## 2019-09-25 ENCOUNTER — Other Ambulatory Visit: Payer: Self-pay | Admitting: Primary Care

## 2019-09-27 NOTE — Telephone Encounter (Signed)
Beth, please advise if you are okay refilling med.

## 2019-09-30 ENCOUNTER — Other Ambulatory Visit: Payer: Self-pay

## 2019-09-30 ENCOUNTER — Ambulatory Visit (INDEPENDENT_AMBULATORY_CARE_PROVIDER_SITE_OTHER): Payer: Medicare Other | Admitting: Podiatry

## 2019-09-30 DIAGNOSIS — S99922D Unspecified injury of left foot, subsequent encounter: Secondary | ICD-10-CM | POA: Diagnosis not present

## 2019-09-30 DIAGNOSIS — S90222D Contusion of left lesser toe(s) with damage to nail, subsequent encounter: Secondary | ICD-10-CM

## 2019-10-01 NOTE — Progress Notes (Signed)
Subjective: Rebecca Mcintyre is a 74 y.o.  female returns to office today for follow up evaluation after having left Hallux total nail avulsion performed.  She states she is doing better and pain is improved and she is some minimal intermittent discomfort at times but overall nothing substantial.  No swelling or redness or any drainage or pus.  She states that when becomes tender she can wear shoes that rub at times. Patient denies fevers, chills, nausea, vomiting. Denies any calf pain, chest pain, SOB.   Objective:  Vitals: Reviewed  General: Well developed, nourished, in no acute distress, alert and oriented x3   Dermatology: Skin is warm, dry and supple bilateral.  Left hallux nail bed appears to be healed.  Small callus with some dried blood still evident but there is no open sore.  There is no tenderness palpation.  There is no edema, erythema or any signs of infection.  Neurovascular status: Intact.    Assesement and Plan: S/p total nail avulsion, doing well.   -Continue soaking in epsom salts twice a day followed by antibiotic ointment and a band-aid during the day and also dispensed offloading pads. -Monitor for any signs/symptoms of infection. Call the office immediately if any occur or go directly to the emergency room. Call with any questions/concerns.  Follow-up as needed  Celesta Gentile, DPM

## 2019-10-06 DIAGNOSIS — H90A22 Sensorineural hearing loss, unilateral, left ear, with restricted hearing on the contralateral side: Secondary | ICD-10-CM | POA: Diagnosis not present

## 2019-10-07 ENCOUNTER — Telehealth: Payer: Self-pay | Admitting: Internal Medicine

## 2019-10-07 NOTE — Chronic Care Management (AMB) (Signed)
  Chronic Care Management   Note  10/07/2019 Name: JONALYN SEDLAK MRN: [id number] DOB: 05/30/1945  SIGNORA ZUCCO is a 74 y.o. year old female who is a primary care patient of Panosh, Standley Brooking, MD. I reached out to [name] by phone today in response to a referral sent by Ms. Kirkland Hun Folson's PCP, Panosh, Standley Brooking, MD.   Ms. Bednarski was given information about Chronic Care Management services today including:  1. CCM service includes personalized support from designated clinical staff supervised by her physician, including individualized plan of care and coordination with other care providers 2. 24/7 contact phone numbers for assistance for urgent and routine care needs. 3. Service will only be billed when office clinical staff spend 20 minutes or more in a month to coordinate care. 4. Only one practitioner may furnish and bill the service in a calendar month. 5. The patient may stop CCM services at any time (effective at the end of the month) by phone call to the office staff.   Patient agreed to services and verbal consent obtained.   Follow up plan:   [place]

## 2019-10-07 NOTE — Chronic Care Management (AMB) (Signed)
  Chronic Care Management   Outreach Note  10/07/2019 Name: Rebecca Mcintyre MRN: [id number] DOB: 11-08-1945  Referred by: Burnis Medin, MD Reason for referral : Chronic Care Management   An unsuccessful telephone outreach was attempted today. The patient was referred to the pharmacist for assistance with care management and care coordination.   Follow Up Plan:   [place]

## 2019-10-11 NOTE — Telephone Encounter (Signed)
Good Afternoon Dr.Alva Can you please advise. Thank you  Dr. [name],  I've been doing so research regarding my breathing issues. I want to get your opinion on two procedures. Zephyr  and Cellular therapy Could either one of those help my breathing especially when I'm walking etc? Is there anything else out there? I wouldn't do anything without your approval. I trust you that much. Rebecca Mcintyre

## 2019-10-12 DIAGNOSIS — I5033 Acute on chronic diastolic (congestive) heart failure: Secondary | ICD-10-CM | POA: Diagnosis not present

## 2019-10-12 DIAGNOSIS — J449 Chronic obstructive pulmonary disease, unspecified: Secondary | ICD-10-CM | POA: Diagnosis not present

## 2019-10-26 ENCOUNTER — Ambulatory Visit: Payer: Medicare Other | Admitting: Podiatry

## 2019-10-27 DIAGNOSIS — M1812 Unilateral primary osteoarthritis of first carpometacarpal joint, left hand: Secondary | ICD-10-CM | POA: Diagnosis not present

## 2019-10-27 DIAGNOSIS — M79645 Pain in left finger(s): Secondary | ICD-10-CM | POA: Diagnosis not present

## 2019-10-27 DIAGNOSIS — M65312 Trigger thumb, left thumb: Secondary | ICD-10-CM | POA: Diagnosis not present

## 2019-10-28 MED ORDER — ALBUTEROL SULFATE HFA 108 (90 BASE) MCG/ACT IN AERS: 2.0000 | INHALATION_SPRAY | Freq: Four times a day (QID) | RESPIRATORY_TRACT | 3 refills | Status: DC | PRN

## 2019-10-28 MED ORDER — ROPINIROLE HCL 0.5 MG PO TABS: ORAL_TABLET | ORAL | 2 refills | Status: DC

## 2019-10-28 NOTE — Telephone Encounter (Signed)
Dr. [name], please advise.

## 2019-10-29 DIAGNOSIS — Z006 Encounter for examination for normal comparison and control in clinical research program: Secondary | ICD-10-CM | POA: Diagnosis not present

## 2019-11-01 NOTE — Telephone Encounter (Signed)
She does not meet criteria for endobronchial valve -zephyr By cellular, presume she is referring to stem cell therapy.  This has not shown to be effective therapy for COPD and minimize control trials so would not advise.  Please refill her albuterol MDI

## 2019-11-05 NOTE — Chronic Care Management (AMB) (Signed)
Chronic Care Management Pharmacy  Name: Rebecca Mcintyre  MRN: [id number] DOB: 10-19-1945  Chief Complaint/ HPI  [name],  74 y.o. , female presents for their Initial CCM visit with the clinical pharmacist via telephone due to COVID-19 Pandemic. 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 She has a twin sister and a brother. She also has 3 kids and has few grand children. She is doing well today and has no present concerns.  PCP : Burnis Medin, MD  Their chronic conditions include: COPD, HTN, HLD, type 2 diabetes, gout, restless leg syndrome   Office Visits: 08/04/19 OV - A1c stable. No changes with medications. Continue working on weight loss.  Consult Visit: 09/30/19 OV Jacqualyn Posey, Podiatry) - s/p total nail avulsion. Patient doing well. Recommended abx ointment and band aid during the day. Monitor sx of infection.  Medications: Outpatient Encounter Medications as of 11/10/2019  Medication Sig  . acetaminophen (TYLENOL) 500 MG tablet Take 1,000 mg by mouth every 6 (six) hours as needed for mild pain.  [name] albuterol (VENTOLIN HFA) 108 (90 Base) MCG/ACT inhaler Inhale 2 puffs into the lungs every 6 (six) hours as needed for wheezing or shortness of breath.  . allopurinol (ZYLOPRIM) 100 MG tablet TAKE 1 TABLET (100 MG TOTAL) BY MOUTH 2 (TWO) TIMES DAILY.  [name] ANORO ELLIPTA 62.5-25 MCG/INH AEPB TAKE 1 PUFF BY MOUTH EVERY DAY  . aspirin 81 MG tablet Take 81 mg by mouth daily.  . furosemide (LASIX) 40 MG tablet TAKE 1 TABLET BY MOUTH EVERY DAY  . hydrocortisone 2.5 % cream Apply topically 2 (two) times daily.  [name] ketoconazole (NIZORAL) 2 % cream Apply 1 application topically 2 (two) times daily.  [name] KLOR-CON M20 20 MEQ tablet TAKE 1 TABLET BY MOUTH EVERY DAY  . metFORMIN (GLUCOPHAGE) 500 MG tablet TAKE 1 TABLET (500 MG TOTAL) BY MOUTH 2 (TWO) TIMES DAILY WITH A MEAL.  . metoprolol tartrate (LOPRESSOR) 25 MG tablet TAKE 1/2 TABLETS BY MOUTH 2 TIMES DAILY  . montelukast (SINGULAIR)  10 MG tablet TAKE 1 TABLET (10 MG TOTAL) AT BEDTIME BY MOUTH.  . nitroGLYCERIN (NITROSTAT) 0.4 MG SL tablet Place 1 tablet (0.4 mg total) under the tongue every 5 (five) minutes as needed.  [name] rOPINIRole (REQUIP) 0.5 MG tablet START 1/2 TAB AT BEDTIME X 2 NIGHTS THEN INCREASE TO 1 TAB AT BEDTIME FOR RESTLESS LEG  . simvastatin (ZOCOR) 40 MG tablet TAKE 1 TABLET BY MOUTH AT BEDTIME  . cephALEXin (KEFLEX) 500 MG capsule Take 1 capsule (500 mg total) by mouth 3 (three) times daily. (Patient not taking: Reported on 11/10/2019)  . guaiFENesin (MUCINEX) 600 MG 12 hr tablet Take by mouth 2 (two) times daily. (Patient not taking: Reported on 11/10/2019)   No facility-administered encounter medications on file as of 11/10/2019.     Transportation Needs: No Transportation Needs  . Lack of Transportation (Medical): No  . Lack of Transportation (Non-Medical): No     Stress: No Stress Concern Present  . Feeling of Stress : Not at all    Current Diagnosis/Assessment:  Goals Addressed            This Visit's Progress   . Chronic Care Management       CARE PLAN ENTRY  Current Barriers:  . Chronic Disease Management support, education, and care coordination needs related to Hypertension, Hyperlipidemia, and Diabetes   Hypertension BP Readings from Last 3 Encounters:  08/19/19 120/62  08/05/19 120/75  08/04/19 128/74   . Pharmacist Clinical Goal(s): o Over the next 150 days, patient will work with PharmD and providers to maintain BP goal <140/90 . Current regimen:  . Furosemide 40 mg 1 tablet daily . Metoprolol 25 mg 0.5 tablet by mouth twice daily . Interventions: o Discussed low salt diet and exercise extensively . Patient self care activities - Over the next 150 days, patient will: o Check BP once every morning, document, and provide at future appointments o Ensure daily salt intake < 2300 mg/day  Hyperlipidemia Lab Results  Component Value Date/Time   LDLCALC 74 03/10/2018 08:10 AM    LDLDIRECT 116.0 02/05/2019 11:05 AM   . Pharmacist Clinical Goal(s): o Over the next 150 days, patient will work with PharmD and providers to maintain LDL goal < 100 . Current regimen:  o Simvastatin 40 mg 1 tablet daily at bedtime . Interventions: o Discussed heart healthy diet and exercise extensively . Patient self care activities - Over the next 150 days, patient will: o Continue eating heart healthy diet and low salt intake o Continue exercising at home as tolerated  Diabetes Lab Results  Component Value Date/Time   HGBA1C 6.7 (A) 08/04/2019 10:10 AM   HGBA1C 6.7 (H) 02/05/2019 11:05 AM   HGBA1C 7.0 (H) 11/03/2018 08:56 AM   . Pharmacist Clinical Goal(s): o Over the next 150 days, patient will work with PharmD and providers to maintain A1c goal <7% . Current regimen:  o Metformin 500 mg 1 tablet by mouth twice daily with meals . Interventions: o Discussed diet and exercise extensively o Counseled on proper timing of taking metformin . Patient self care activities - Over the next 150 days, patient will: o Check blood sugar in the morning before eating or drinking, document, and provide at future appointments o Contact provider with any episodes of hypoglycemia  Medication management . Pharmacist Clinical Goal(s): o Over the next 150 days, patient will work with PharmD and providers to maintain optimal medication adherence . Current pharmacy: [facility] . Interventions o Comprehensive medication review performed. o Continue current medication management strategy . Patient self care activities - Over the next 150 days, patient will: o Focus on medication adherence by pill organizers o Take medications as prescribed o Report any questions or concerns to PharmD and/or provider(s)  Initial goal documentation         COPD / Asthma / Tobacco    Gold Grade: Gold 2 (FEV1 50-79%) 12/29/2018 Current COPD Classification:  B (high sx, <2 exacerbations/yr) June 2020 and  November 2020  Eosinophil count:   Lab Results  Component Value Date/Time   EOSPCT 3.2 01/01/2019 09:48 AM  %                               Eos (Absolute):  Lab Results  Component Value Date/Time   EOSABS 282 01/01/2019 09:48 AM    Tobacco Status:  Social History   Tobacco Use  Smoking Status Former Smoker  . Packs/day: 1.00  . Years: 39.00  . Pack years: 39.00  . Types: Cigarettes  . Quit date: 11/04/2018  . Years since quitting: 1.0  Smokeless Tobacco Never Used  Tobacco Comment   39    Patient has failed these meds in past: None Patient is currently controlled on the following medications:   Anoro Ellipta 62.5/25 mcg/inh 1 puff by mouth everyday  Montelukast 10 mg 1 tablet at bedtime  Albuterol  HFA 2 puffs into the lungs every 6 hrs as needed for wheezing or shortness of breath Using maintenance inhaler regularly? Yes Frequency of rescue inhaler use:  daily  Sleeps with oxygen every night. Had episode last June when she had to use it 24/7. It triggered when she went to rehab and rode a bicycle and walked without oxygen. For now, her exercise routine includes watching walk-in place on DVDs at home. She wants to start walking outside, but sometimes It gets too humid and hot and that triggers her breathing. Her last time using  albuterol was yesterday, and has been using it at least once a day. She is also in a COPD study in [place].  Plan  Continue current medications  Exercise moderately and as tolerated   Heart Failure   Type: Diastolic  Last ejection fraction: 60 - 65% (12/2018) NYHA Class: II (slight limitation of activity) AHA HF Stage: C (Heart disease and symptoms present)  Patient has failed these meds in past: None Patient is currently controlled on the following medications:  Furosemide 40 mg 1 tablet daily as needed  Metoprolol 25 mg 0.5 tablet twice daily  Nitrostat 0.4 mg SL 1 tablet under tongue every 5 mins as needed   No edema for  the past 2 weeks. Discussed about low salt diet. Weighs everyday. Patient hasn't had to use nitrostat since 2002, but just keeps it handy. Takes potassium with furosemide as needed for edema.  Plan  Continue current medications     Hypertension    Office blood pressures are  BP Readings from Last 3 Encounters:  08/19/19 120/62  08/05/19 120/75  08/04/19 128/74    Patient has failed these meds in the past: None Patient is currently controlled on the following medications:  . Furosemide 40 mg 1 tablet daily . Metoprolol 25 mg 0.5 tablet by mouth twice daily  Patient checks BP at home daily  Patient home BP readings are ranging: 120/80  We discussed the importance of low salt diet and exercise. BP stable. Checks her BP every morning.  Plan  Continue current medications and control with diet and exercise     Hyperlipidemia   Lipid Panel     Component Value Date/Time   CHOL 185 02/05/2019 1105   TRIG 226.0 (H) 02/05/2019 1105   HDL 47.00 02/05/2019 1105   CHOLHDL 4 02/05/2019 1105   VLDL 45.2 (H) 02/05/2019 1105   LDLCALC 74 03/10/2018 0810   LDLDIRECT 116.0 02/05/2019 1105     The ASCVD Risk score (Goff DC Jr., et al., 2013) failed to calculate for the following reasons:   The patient has a prior MI or stroke diagnosis   Patient has failed these meds in past: None Patient is currently controlled on the following medications:  . Simvastatin 40 mg 1 tablet daily at bedtime  Patient loves [place] food at lunch. Loves to [employer]. Drinks coffee most mornings. Eats precooked meals for dinner. Advised to eat in balance and moderation.  Plan  Continue current medications and control with diet and exercise   Diabetes   Recent Relevant Labs: Lab Results  Component Value Date/Time   HGBA1C 6.7 (A) 08/04/2019 10:10 AM   HGBA1C 6.7 (H) 02/05/2019 11:05 AM   HGBA1C 7.0 (H) 11/03/2018 08:56 AM   GFR 70.44 02/05/2019 11:05 AM   GFR 71.49 12/29/2018 02:11 PM     MICROALBUR 0.7 04/02/2019 09:05 AM   MICROALBUR 1.1 03/10/2017 12:05 PM     Checking  BG: Daily  Patient has failed these meds in past: None Patient is currently controlled on the following medications:  [name] Metformin 500 mg 1 tablet by mouth twice daily with meals  Last diabetic Eye exam:  Lab Results  Component Value Date/Time   HMDIABEYEEXA No Retinopathy 05/04/2019 12:00 AM    Last diabetic Foot exam: No results found for: HMDIABFOOTEX   A1c within goal < 7%. Patient has been eating well and in moderation. Patient is exercising at home as tolerated. Patient is regularly checking her blood sugar at home every morning. Counseled the patient to take metformin with our without meal which does not change its absorption in the body.  Plan  Continue current medications and control with diet and exercise   Gout   Patient has failed these meds in past: colchicine Patient is currently controlled on the following medications:  . Allopurinol 100 mg 1 tablet twice daily  Patient denies flares for the past months and is doing well. Counseled on foods that could trigger gout flares.  Plan  Continue current medications and diet   Restless Leg Syndrome   Patient has failed these meds in past: None Patient is currently controlled on the following medications:  . Ropinirole 0.5 mg start 0.5 tablet at bedtime for 2 nights, then increase to 1 tablet at bedtime  Plan  Continue current medications   OTCs/Health Maintenance   Patient is currently controlled on the following medications: . APAP 500 mg 2 tablets every 6 hrs as needed for mild pain . Aspirin 81 mg 1 tablet daily . Hydrocortisone 2.5% cream twice daily . Ketoconazole 2% cream twice daily . Klor-con 20 mEq 1 tablet daily as needed with furosemide  Plan  Continue current medications  . Vaccines   Reviewed and discussed patient's vaccination history.    Immunization History  Administered Date(s) Administered   . Fluad Quad(high Dose 65+) 02/11/2019  . Influenza Split 04/29/2011, 04/08/2012  . Influenza Whole 02/16/2008, 03/15/2009, 03/20/2010  . Influenza, High Dose Seasonal PF 04/14/2013, 02/14/2015, 02/28/2016, 03/10/2017, 03/25/2018  . Influenza,inj,Quad PF,6+ Mos 04/18/2014  . Influenza-Unspecified 02/24/2017  . PFIZER SARS-COV-2 Vaccination 06/15/2019, 07/06/2019  . Pneumococcal Conjugate-13 04/08/2012  . Pneumococcal Polysaccharide-23 04/14/2013  . Td 09/14/2007  . Zoster 09/14/2007    Plan  Recommended patient receive shingrix vaccine in the office/pharmacy.   Medication Management   Pharmacy/Benefits: [facility] / [facility] Patient was able to name her medications correctly. She is doing well organizing her medications.  Plan  Continue current medication management strategy   Follow up: 3 month phone visit   Geraldine Contras, PharmD Clinical Pharmacist [facility] [place] [phone]

## 2019-11-09 ENCOUNTER — Telehealth: Payer: Self-pay | Admitting: Pulmonary Disease

## 2019-11-09 NOTE — Telephone Encounter (Signed)
Spoke with Annaleise at [facility], requesting office notes from the last year showing where pt had an exacerbation requiring prednisone and/or antibiotics. Called and spoke with pt to ensure that she was ok with sending these notes, as she does not have this office on her DPR.  Pt verbally expressed approval to send these notes as requested.   Notes faxed to Annaleise at [phone].  Nothing further needed at this time- will close encounter.

## 2019-11-10 ENCOUNTER — Other Ambulatory Visit: Payer: Self-pay

## 2019-11-10 ENCOUNTER — Ambulatory Visit: Payer: Medicare Other | Admitting: Pharmacist

## 2019-11-10 DIAGNOSIS — E785 Hyperlipidemia, unspecified: Secondary | ICD-10-CM

## 2019-11-10 DIAGNOSIS — I10 Essential (primary) hypertension: Secondary | ICD-10-CM

## 2019-11-10 DIAGNOSIS — E119 Type 2 diabetes mellitus without complications: Secondary | ICD-10-CM

## 2019-11-10 NOTE — Patient Instructions (Addendum)
Visit Information  It was a pleasure speaking with you this morning, Ms. Rebecca Mcintyre! Thank you for meeting with me to discuss your medications! I look forward to working with you to achieve your health care goals. Below is a summary of what we talked about during the visit:  Goals Addressed            This Visit's Progress   . Chronic Care Management       CARE PLAN ENTRY  Current Barriers:  . Chronic Disease Management support, education, and care coordination needs related to Hypertension, Hyperlipidemia, and Diabetes   Hypertension BP Readings from Last 3 Encounters:  08/19/19 120/62  08/05/19 120/75  08/04/19 128/74   . Pharmacist Clinical Goal(s): o Over the next 150 days, patient will work with PharmD and providers to maintain BP goal <140/90 . Current regimen:  . Furosemide 40 mg 1 tablet daily . Metoprolol 25 mg 0.5 tablet by mouth twice daily . Interventions: o Discussed low salt diet and exercise extensively . Patient self care activities - Over the next 150 days, patient will: o Check BP once every morning, document, and provide at future appointments o Ensure daily salt intake < 2300 mg/day  Hyperlipidemia Lab Results  Component Value Date/Time   LDLCALC 74 03/10/2018 08:10 AM   LDLDIRECT 116.0 02/05/2019 11:05 AM   . Pharmacist Clinical Goal(s): o Over the next 150 days, patient will work with PharmD and providers to maintain LDL goal < 100 . Current regimen:  o Simvastatin 40 mg 1 tablet daily at bedtime . Interventions: o Discussed heart healthy diet and exercise extensively . Patient self care activities - Over the next 150 days, patient will: o Continue eating heart healthy diet and low salt intake o Continue exercising at home as tolerated  Diabetes Lab Results  Component Value Date/Time   HGBA1C 6.7 (A) 08/04/2019 10:10 AM   HGBA1C 6.7 (H) 02/05/2019 11:05 AM   HGBA1C 7.0 (H) 11/03/2018 08:56 AM   . Pharmacist Clinical Goal(s): o Over the next 150  days, patient will work with PharmD and providers to maintain A1c goal <7% . Current regimen:  o Metformin 500 mg 1 tablet by mouth twice daily with meals . Interventions: o Discussed diet and exercise extensively o Counseled on proper timing of taking metformin . Patient self care activities - Over the next 150 days, patient will: o Check blood sugar in the morning before eating or drinking, document, and provide at future appointments o Contact provider with any episodes of hypoglycemia  Medication management . Pharmacist Clinical Goal(s): o Over the next 150 days, patient will work with PharmD and providers to maintain optimal medication adherence . [facility] . Interventions o Comprehensive medication review performed. o Continue current medication management strategy . Patient self care activities - Over the next 150 days, patient will: o Focus on medication adherence by pill organizers o Take medications as prescribed o Report any questions or concerns to PharmD and/or provider(s)  Initial goal documentation         [name] was given information about Chronic Care Management services today including:  1. CCM service includes personalized support from designated clinical staff supervised by her physician, including individualized plan of care and coordination with other care providers 2. 24/7 contact phone numbers for assistance for urgent and routine care needs. 3. Standard insurance, coinsurance, copays and deductibles apply for chronic care management only during months in which we provide at least 20 minutes of  these services. Most insurances cover these services at 100%, however patients may be responsible for any copay, coinsurance and/or deductible if applicable. This service may help you avoid the need for more expensive face-to-face services. 4. Only one practitioner may furnish and bill the service in a calendar month. 5. The patient may stop  CCM services at any time (effective at the end of the month) by phone call to the office staff.  Patient agreed to services and verbal consent obtained.   The patient verbalized understanding of instructions provided today and agreed to receive a mailed copy of patient instruction and/or educational materials. Telephone follow up appointment with pharmacy team member scheduled for: 04/07/20 at [place], PharmD Clinical Pharmacist [facility] [phone]     COPD and Physical Activity Chronic obstructive pulmonary disease (COPD) is a long-term (chronic) condition that affects the lungs. COPD is a general term that can be used to describe many different lung problems that cause lung swelling (inflammation) and limit airflow, including chronic bronchitis and emphysema. The main symptom of COPD is shortness of breath, which makes it harder to do even simple tasks. This can also make it harder to exercise and be active. Talk with your health care provider about treatments to help you breathe better and actions you can take to prevent breathing problems during physical activity. What are the benefits of exercising with COPD? Exercising regularly is an important part of a healthy lifestyle. You can still exercise and do physical activities even though you have COPD. Exercise and physical activity improve your shortness of breath by increasing blood flow (circulation). This causes your heart to pump more oxygen through your body. Moderate exercise can improve your:  Oxygen use.  Energy level.  Shortness of breath.  Strength in your breathing muscles.  Heart health.  Sleep.  Self-esteem and feelings of self-worth.  Depression, stress, and anxiety levels. Exercise can benefit everyone with COPD. The severity of your disease may affect how hard you can exercise, especially at first, but everyone can benefit. Talk with your health care provider about how  much exercise is safe for you, and which activities and exercises are safe for you. What actions can I take to prevent breathing problems during physical activity?  Sign up for a pulmonary rehabilitation program. This type of program may include: ? Education about lung diseases. ? Exercise classes that teach you how to exercise and be more active while improving your breathing. This usually involves:  Exercise using your lower extremities, such as a stationary bicycle.  About 30 minutes of exercise, 2 to 5 times per week, for 6 to 12 weeks  Strength training, such as push ups or leg lifts. ? Nutrition education. ? Group classes in which you can talk with others who also have COPD and learn ways to manage stress.  If you use an oxygen tank, you should use it while you exercise. Work with your health care provider to adjust your oxygen for your physical activity. Your resting flow rate is different from your flow rate during physical activity.  While you are exercising: ? Take slow breaths. ? Pace yourself and do not try to go too fast. ? Purse your lips while breathing out. Pursing your lips is similar to a kissing or whistling position. ? If doing exercise that uses a quick burst of effort, such as weight lifting:  Breathe in before starting the exercise.  Breathe out during the hardest part of  the exercise (such as raising the weights). Where to find support You can find support for exercising with COPD from:  Your health care provider.  A pulmonary rehabilitation program.  Your local health department or community health programs.  Support groups, online or in-person. Your health care provider may be able to recommend support groups. Where to find more information You can find more information about exercising with COPD from:  American Lung Association: ClassInsider.se.  COPD Foundation: https://www.rivera.net/. Contact a health care provider if:  Your symptoms get worse.  You  have chest pain.  You have nausea.  You have a fever.  You have trouble talking or catching your breath.  You want to start a new exercise program or a new activity. Summary  COPD is a general term that can be used to describe many different lung problems that cause lung swelling (inflammation) and limit airflow. This includes chronic bronchitis and emphysema.  Exercise and physical activity improve your shortness of breath by increasing blood flow (circulation). This causes your heart to provide more oxygen to your body.  Contact your health care provider before starting any exercise program or new activity. Ask your health care provider what exercises and activities are safe for you. This information is not intended to replace advice given to you by your health care provider. Make sure you discuss any questions you have with your health care provider. Document Revised: 09/02/2018 Document Reviewed: 06/05/2017 Elsevier Patient Education  2020 [employer].

## 2019-11-10 NOTE — Telephone Encounter (Signed)
Pt's DME was changed to [facility]. Nothing further needed.

## 2019-11-11 ENCOUNTER — Other Ambulatory Visit: Payer: Self-pay | Admitting: Internal Medicine

## 2019-11-12 DIAGNOSIS — J449 Chronic obstructive pulmonary disease, unspecified: Secondary | ICD-10-CM | POA: Diagnosis not present

## 2019-11-12 DIAGNOSIS — I5033 Acute on chronic diastolic (congestive) heart failure: Secondary | ICD-10-CM | POA: Diagnosis not present

## 2019-11-22 ENCOUNTER — Other Ambulatory Visit: Payer: Self-pay

## 2019-12-02 NOTE — Telephone Encounter (Signed)
Dr. [name], Please see patient comment from mychart and advise.  Thank you.

## 2019-12-02 NOTE — Telephone Encounter (Signed)
Okay to fill out handicap parking request

## 2019-12-08 MED ORDER — ANORO ELLIPTA 62.5-25 MCG/INH IN AEPB: INHALATION_SPRAY | RESPIRATORY_TRACT | 2 refills | Status: DC

## 2019-12-12 DIAGNOSIS — J449 Chronic obstructive pulmonary disease, unspecified: Secondary | ICD-10-CM | POA: Diagnosis not present

## 2019-12-12 DIAGNOSIS — I5033 Acute on chronic diastolic (congestive) heart failure: Secondary | ICD-10-CM | POA: Diagnosis not present

## 2019-12-13 ENCOUNTER — Other Ambulatory Visit: Payer: Self-pay | Admitting: Internal Medicine

## 2019-12-21 MED ORDER — AZITHROMYCIN 250 MG PO TABS: ORAL_TABLET | ORAL | 0 refills | Status: DC

## 2019-12-21 MED ORDER — PREDNISONE 10 MG PO TABS: ORAL_TABLET | ORAL | 0 refills | Status: DC

## 2019-12-21 NOTE — Telephone Encounter (Signed)
Called and spoke with Patient.  Patient is seen by Dr [name] for emphysema, LOV 08/04/19. Patient stated she started feeling bad Friday.  Patient stated she starting having a non productive cough, labored breathing, and chest tightness yesterday.  Patient denies any fever or chills.  Patient stated she has been using her inhalers as prescribed, but feels she is having copd flare up. Patient is willing to be seen in office, but there are no openings in office this week. [facility] uses [facility] on [address].  Message routed to Beth,NP to advise

## 2019-12-21 NOTE — Telephone Encounter (Signed)
I will send in prescription for zpack and steroid taper to treat suspected COPD exacerbation. Follow up in 1-2 weeks to ensure she is getting better.

## 2019-12-22 ENCOUNTER — Other Ambulatory Visit: Payer: Self-pay | Admitting: Internal Medicine

## 2020-01-06 DIAGNOSIS — L821 Other seborrheic keratosis: Secondary | ICD-10-CM | POA: Diagnosis not present

## 2020-01-06 DIAGNOSIS — L570 Actinic keratosis: Secondary | ICD-10-CM | POA: Diagnosis not present

## 2020-01-06 DIAGNOSIS — L720 Epidermal cyst: Secondary | ICD-10-CM | POA: Diagnosis not present

## 2020-01-06 DIAGNOSIS — L280 Lichen simplex chronicus: Secondary | ICD-10-CM | POA: Diagnosis not present

## 2020-01-09 DIAGNOSIS — Z20822 Contact with and (suspected) exposure to covid-19: Secondary | ICD-10-CM | POA: Diagnosis not present

## 2020-01-12 DIAGNOSIS — J449 Chronic obstructive pulmonary disease, unspecified: Secondary | ICD-10-CM | POA: Diagnosis not present

## 2020-01-12 DIAGNOSIS — I5033 Acute on chronic diastolic (congestive) heart failure: Secondary | ICD-10-CM | POA: Diagnosis not present

## 2020-01-26 DIAGNOSIS — Z1231 Encounter for screening mammogram for malignant neoplasm of breast: Secondary | ICD-10-CM | POA: Diagnosis not present

## 2020-01-26 LAB — HM MAMMOGRAPHY

## 2020-02-01 ENCOUNTER — Other Ambulatory Visit: Payer: Self-pay

## 2020-02-01 ENCOUNTER — Telehealth: Payer: Self-pay | Admitting: Pulmonary Disease

## 2020-02-01 ENCOUNTER — Other Ambulatory Visit: Payer: Medicare Other

## 2020-02-01 DIAGNOSIS — Z20822 Contact with and (suspected) exposure to covid-19: Secondary | ICD-10-CM | POA: Diagnosis not present

## 2020-02-01 NOTE — Telephone Encounter (Signed)
Primary Pulmonologist: Dr.Alva Last office visit and with whom: 08/04/19 Dr.Alva What do we see them for (pulmonary problems): Centrilobular Emphysema/COPD  Last OV assessment/plan:   Was appointment offered to patient (explain)?     Reason for call: Spoke with patient's daughter Rebecca Mcintyre regarding patient.Last Thursday  symptoms started is getting worse with breathing and energy level, falling asleep sitting up, fluid in ear, headache, body aches, shallow breathing no fever.O2 has been in 92% on 2L advised patient's daughter she can put oxygen up to 3L. Patient's daughter stated she is going to bring mom for her COVID test todayLorie was wondering if this is another flare up?[name] Rebecca Mcintyre could you pleased advise. Thank you Allergies  Allergen Reactions  . Atorvastatin     REACTION: myalgias  and liver    Immunization History  Administered Date(s) Administered  . Fluad Quad(high Dose 65+) 02/11/2019  . Influenza Split 04/29/2011, 04/08/2012  . Influenza Whole 02/16/2008, 03/15/2009, 03/20/2010  . Influenza, High Dose Seasonal PF 04/14/2013, 02/14/2015, 02/28/2016, 03/10/2017, 03/25/2018  . Influenza,inj,Quad PF,6+ Mos 04/18/2014  . Influenza-Unspecified 02/24/2017  . PFIZER SARS-COV-2 Vaccination 06/15/2019, 07/06/2019  . Pneumococcal Conjugate-13 04/08/2012  . Pneumococcal Polysaccharide-23 04/14/2013  . Td 09/14/2007  . Zoster 09/14/2007

## 2020-02-01 NOTE — Telephone Encounter (Signed)
pt's daughter calling in stating pt (mom) is getting worse with breathing and energy level, falling asleep sitting up, fluid in ear, headache, body aches, shallow breathing - wants to know if she can make an appt - with NP due to Dr. Bari Mantis availability-  states she is doing a COVID test this afternoon. Please advise [name] (per DPR) at [phone]

## 2020-02-01 NOTE — Telephone Encounter (Signed)
Difficult to sort out over messages. It looks like she has a visit with PCP today, I would see what they have to say. She is due for follow-up with [name] this month.

## 2020-02-01 NOTE — Telephone Encounter (Signed)
Called and spoke with pt's daughter [name] letting her know the info stated by [facility]. [name] said that pt's appt to day with PCP is just for a covid test.  Stated to her once the covid test results come back to call us to let us know and then see when we could get pt in for an appt and she verbalized understanding. Nothing further needed.

## 2020-02-02 LAB — NOVEL CORONAVIRUS, NAA: SARS-CoV-2, NAA: NOT DETECTED

## 2020-02-02 LAB — SARS-COV-2, NAA 2 DAY TAT

## 2020-02-03 ENCOUNTER — Ambulatory Visit (INDEPENDENT_AMBULATORY_CARE_PROVIDER_SITE_OTHER): Payer: Medicare Other

## 2020-02-03 ENCOUNTER — Ambulatory Visit: Payer: Medicare Other | Admitting: Adult Health

## 2020-02-03 ENCOUNTER — Other Ambulatory Visit: Payer: Self-pay | Admitting: Internal Medicine

## 2020-02-03 ENCOUNTER — Other Ambulatory Visit: Payer: Self-pay

## 2020-02-03 ENCOUNTER — Encounter: Payer: Self-pay | Admitting: Adult Health

## 2020-02-03 VITALS — BP 130/82 | HR 74 | Temp 97.5°F | Wt 253.4 lb

## 2020-02-03 DIAGNOSIS — J9611 Chronic respiratory failure with hypoxia: Secondary | ICD-10-CM | POA: Diagnosis not present

## 2020-02-03 DIAGNOSIS — J432 Centrilobular emphysema: Secondary | ICD-10-CM

## 2020-02-03 DIAGNOSIS — I517 Cardiomegaly: Secondary | ICD-10-CM | POA: Diagnosis not present

## 2020-02-03 DIAGNOSIS — Z20822 Contact with and (suspected) exposure to covid-19: Secondary | ICD-10-CM | POA: Diagnosis not present

## 2020-02-03 DIAGNOSIS — J441 Chronic obstructive pulmonary disease with (acute) exacerbation: Secondary | ICD-10-CM | POA: Diagnosis not present

## 2020-02-03 DIAGNOSIS — J9612 Chronic respiratory failure with hypercapnia: Secondary | ICD-10-CM

## 2020-02-03 DIAGNOSIS — J449 Chronic obstructive pulmonary disease, unspecified: Secondary | ICD-10-CM | POA: Diagnosis not present

## 2020-02-03 DIAGNOSIS — J811 Chronic pulmonary edema: Secondary | ICD-10-CM | POA: Diagnosis not present

## 2020-02-03 DIAGNOSIS — J90 Pleural effusion, not elsewhere classified: Secondary | ICD-10-CM | POA: Diagnosis not present

## 2020-02-03 MED ORDER — AMOXICILLIN-POT CLAVULANATE 875-125 MG PO TABS: 1.0000 | ORAL_TABLET | Freq: Two times a day (BID) | ORAL | 0 refills | Status: AC

## 2020-02-03 MED ORDER — PREDNISONE 10 MG PO TABS: ORAL_TABLET | ORAL | 0 refills | Status: DC

## 2020-02-03 MED ORDER — AZITHROMYCIN 250 MG PO TABS: ORAL_TABLET | ORAL | 0 refills | Status: DC

## 2020-02-03 NOTE — Telephone Encounter (Signed)
Pt seen in office today.

## 2020-02-03 NOTE — Progress Notes (Signed)
_0  ID: Rebecca Mcintyre, female    DOB: 1946/03/01, 74 y.o.   MRN: [id number]  Chief Complaint  Patient presents with  . Shortness of Breath    started last thursday all of a sudden.  having to use the oxygen all day instead of only at night. severe headache. clear sinus drainage. severe body aches and leg pain.  unable to sleep good.     Referring provider: Burnis Medin, MD  HPI: 74 year old female former smoker followed for COPD and chronic hypoxic respiratory failure on oxygen Medical history significant for diabetes.  TEST/EVENTS :  PFTs8/08/2018 FVC 2.31 (82), FEV1 1.20 (56), ratio 52, no bronchodilatory response,DLCO 51 %  Imaging 6/10/20CTAChest-No PE; cardiomegaly andemphysematous changes of the lung  NPSG7/19/20 no obstructive sleep apnea, min O2 89%. Periodic limb movements did occur during sleep  02/03/2020 Acute OV  : Cough  Patient presents for an acute office visit.  She complains over the last week she has had increased cough congestion shortness of breath wheezing and body aches.  She is also had increased oxygen demands.  Using oxygen 2 to 3 L during the daytime.  Typically uses oxygen only at bedtime. Started to use during daytime with activity . O2 sats dropping into 80s walking .  She denies any fever.  Covid vaccines are up-to-date.  Patient did have direct contact with a family member that is Covid positive. She had COVID-19 testing on February 01, 2020 that was negative.  Patient has significant body aches and low energy.. Ankles not swollen in am but as day goes on become swollen . Takes Lasix 12m daily most days.   She remains on Anoro daily. Wt is 50 lbs over last year, up 25 in last 6 months   Allergies  Allergen Reactions  . Atorvastatin     REACTION: myalgias  and liver    Immunization History  Administered Date(s) Administered  . Fluad Quad(high Dose 65+) 02/11/2019  . Influenza Split 04/29/2011, 04/08/2012  . Influenza Whole  02/16/2008, 03/15/2009, 03/20/2010  . Influenza, High Dose Seasonal PF 04/14/2013, 02/14/2015, 02/28/2016, 03/10/2017, 03/25/2018  . Influenza,inj,Quad PF,6+ Mos 04/18/2014  . Influenza-Unspecified 02/24/2017  . PFIZER SARS-COV-2 Vaccination 06/15/2019, 07/06/2019  . Pneumococcal Conjugate-13 04/08/2012  . Pneumococcal Polysaccharide-23 04/14/2013  . Td 09/14/2007  . Zoster 09/14/2007    Past Medical History:  Diagnosis Date  . Allergy   . ASCUS on Pap smear    had repeat x 2 normal with Dr. RMarvel Plan2011  . Coronary artery disease   . Gout   . Hyperlipidemia   . Hypertension   . Myocardial infarction ([facility]    2002 95% proximal LAD stenosis with thrombus followed by 90% stenosis, the first diagonal 80% stenosis, circumflex 30% stenosis, circumflex obtuse marginal subbranch 70% stenosis, PDA 40% stenosis. She had a drug-eluting stent placement with a Pixel stent  . Osteopenia   . Solitary kidney     Tobacco History: Social History   Tobacco Use  Smoking Status Former Smoker  . Packs/day: 1.00  . Years: 39.00  . Pack years: 39.00  . Types: Cigarettes  . Quit date: 11/04/2018  . Years since quitting: 1.2  Smokeless Tobacco Never Used  Tobacco Comment   39   Counseling given: Not Answered Comment: 39   Outpatient Medications Prior to Visit  Medication Sig Dispense Refill  . acetaminophen (TYLENOL) 500 MG tablet Take 1,000 mg by mouth every 6 (six) hours as needed for mild pain.    .[name]  albuterol (VENTOLIN HFA) 108 (90 Base) MCG/ACT inhaler Inhale 2 puffs into the lungs every 6 (six) hours as needed for wheezing or shortness of breath. 18 g 3  . allopurinol (ZYLOPRIM) 100 MG tablet TAKE 1 TABLET (100 MG TOTAL) BY MOUTH 2 (TWO) TIMES DAILY. 180 tablet 1  . aspirin 81 MG tablet Take 81 mg by mouth daily.    [name] azithromycin (ZITHROMAX) 250 MG tablet Zpack taper as directed 6 tablet 0  . furosemide (LASIX) 40 MG tablet TAKE 1 TABLET BY MOUTH EVERY DAY 90 tablet 0  .  hydrocortisone 2.5 % cream Apply topically 2 (two) times daily. 30 g 0  . ketoconazole (NIZORAL) 2 % cream Apply 1 application topically 2 (two) times daily. 30 g 0  . KLOR-CON M20 20 MEQ tablet TAKE 1 TABLET BY MOUTH EVERY DAY 30 tablet 2  . metFORMIN (GLUCOPHAGE) 500 MG tablet TAKE 1 TABLET (500 MG TOTAL) BY MOUTH 2 (TWO) TIMES DAILY WITH A MEAL. 180 tablet 1  . metoprolol tartrate (LOPRESSOR) 25 MG tablet TAKE 1/2 TABLETS BY MOUTH 2 TIMES DAILY 90 tablet 0  . montelukast (SINGULAIR) 10 MG tablet TAKE 1 TABLET (10 MG TOTAL) AT BEDTIME BY MOUTH. 90 tablet 0  . nitroGLYCERIN (NITROSTAT) 0.4 MG SL tablet Place 1 tablet (0.4 mg total) under the tongue every 5 (five) minutes as needed. 30 tablet 0  . rOPINIRole (REQUIP) 0.5 MG tablet START 1/2 TAB AT BEDTIME X 2 NIGHTS THEN INCREASE TO 1 TAB AT BEDTIME FOR RESTLESS LEG 90 tablet 2  . simvastatin (ZOCOR) 40 MG tablet TAKE 1 TABLET BY MOUTH AT BEDTIME 90 tablet 0  . umeclidinium-vilanterol (ANORO ELLIPTA) 62.5-25 MCG/INH AEPB TAKE 1 PUFF BY MOUTH EVERY DAY 60 each 2  . cephALEXin (KEFLEX) 500 MG capsule Take 1 capsule (500 mg total) by mouth 3 (three) times daily. (Patient not taking: Reported on 11/10/2019) 21 capsule 0  . guaiFENesin (MUCINEX) 600 MG 12 hr tablet Take by mouth 2 (two) times daily. (Patient not taking: Reported on 11/10/2019)    . predniSONE (DELTASONE) 10 MG tablet Take 4 tabs po daily x 2 days; then 3 tabs for 2 days; then 2 tabs for 2 days; then 1 tab for 2 days 20 tablet 0   No facility-administered medications prior to visit.     Review of Systems:   Constitutional:   No  weight loss, night sweats,  Fevers, chills, fatigue, or  lassitude.  HEENT:   No headaches,  Difficulty swallowing,  Tooth/dental problems, or  Sore throat,                No sneezing, itching, ear ache, nasal congestion, post nasal drip,   CV:  No chest pain,  Orthopnea, PND, +swelling in lower extremities, anasarca, dizziness, palpitations, syncope.   GI   No heartburn, indigestion, abdominal pain, nausea, vomiting, diarrhea, change in bowel habits, loss of appetite, bloody stools.   Resp:  .  No chest wall deformity  Skin: no rash or lesions.  GU: no dysuria, change in color of urine, no urgency or frequency.  No flank pain, no hematuria   MS:  No joint pain or swelling.  No decreased range of motion.  No back pain.    Physical Exam  BP 130/82   Pulse 74   Temp (!) 97.5 F (36.4 C) (Oral)   Wt 253 lb 6 oz (114.9 kg)   SpO2 91%   BMI 44.88 kg/m   GEN: A/Ox3; pleasant ,  NAD, BMI 44   HEENT:  [place]/AT,   NOSE-clear, THROAT-clear, no lesions, no postnasal drip or exudate noted.   NECK:  Supple w/ fair ROM; no JVD; normal carotid impulses w/o bruits; no thyromegaly or nodules palpated; no lymphadenopathy.    RESP  Clear  P & A; w/o, wheezes/ rales/ or rhonchi. no accessory muscle use, no dullness to percussion  CARD:  RRR, no m/r/g, 1+ peripheral edema, pulses intact, no cyanosis or clubbing.  GI:   Soft & nt; nml bowel sounds; no organomegaly or masses detected.   Musco: Warm bil, no deformities or joint swelling noted.   Neuro: alert, no focal deficits noted.    Skin: Warm, no lesions or rashes    Lab Results:   BNP    PFT Results Latest Ref Rng & Units 12/29/2018  FVC-Pre L 2.28  FVC-Predicted Pre % 81  FVC-Post L 2.31  FVC-Predicted Post % 82  Pre FEV1/FVC % % 51  Post FEV1/FCV % % 52  FEV1-Pre L 1.17  FEV1-Predicted Pre % 55  FEV1-Post L 1.20  DLCO uncorrected ml/min/mmHg 9.62  DLCO UNC% % 51  DLVA Predicted % 67  TLC L 5.11  TLC % Predicted % 104  RV % Predicted % 128    No results found for: NITRICOXIDE      Assessment & Plan:   COPD (chronic obstructive pulmonary disease) (HCC) Acute exacerbation  Recent COVID-19 testing was negative however patient did have direct contact and is at high risk we will retest 1 more time to make sure Covid testing is in fact negative. Check chest x-ray today.   We will also check labs including CBC be met and a BNP.  Patient does have quite a bit of fluid retention she has underlying diastolic dysfunction with some mild volume overload on exam.  Does not appear to be in acute failure. We will treat with antibiotics short course of steroids.  And gentle diuresis. Close follow-up in 1 week.  Plan  Patient Instructions  Covid test .  Chest xray today .  Labs today  Zpack take as directed  Prednisone taper over next week Take extra lasix x 1 dose .  Low salt diet  Oxygen 2-3 l/m with activity as needed and At bedtime  To keep sats>88-90%.  Follow up in 1 week with Dr. [name]  And As needed   Please contact office for sooner follow up if symptoms do not improve or worsen or seek emergency care        Chronic respiratory failure with hypoxia and hypercapnia (HCC) Chronic respiratory failure on oxygen with activity at bedtime.  Patient had increased oxygen demands with exertional desaturations that seem to be increased from her normal baseline.  Patient is to continue on oxygen to keep O2 saturations greater than 88 to 90%.  Plan  Patient Instructions  Covid test .  Chest xray today .  Labs today  Zpack take as directed  Prednisone taper over next week Take extra lasix x 1 dose .  Low salt diet  Oxygen 2-3 l/m with activity as needed and At bedtime  To keep sats>88-90%.  Follow up in 1 week with Dr. [name]  And As needed   Please contact office for sooner follow up if symptoms do not improve or worsen or seek emergency care          Rexene Edison, NP 02/03/2020

## 2020-02-03 NOTE — Assessment & Plan Note (Signed)
Chronic respiratory failure on oxygen with activity at bedtime.  Patient had increased oxygen demands with exertional desaturations that seem to be increased from her normal baseline.  Patient is to continue on oxygen to keep O2 saturations greater than 88 to 90%.  Plan  Patient Instructions  Covid test .  Chest xray today .  Labs today  Zpack take as directed  Prednisone taper over next week Take extra lasix x 1 dose .  Low salt diet  Oxygen 2-3 l/m with activity as needed and At bedtime  To keep sats>88-90%.  Follow up in 1 week with Dr. [name]  And As needed   Please contact office for sooner follow up if symptoms do not improve or worsen or seek emergency care

## 2020-02-03 NOTE — Assessment & Plan Note (Signed)
Acute exacerbation  Recent COVID-19 testing was negative however patient did have direct contact and is at high risk we will retest 1 more time to make sure Covid testing is in fact negative. Check chest x-ray today.  We will also check labs including CBC be met and a BNP.  Patient does have quite a bit of fluid retention she has underlying diastolic dysfunction with some mild volume overload on exam.  Does not appear to be in acute failure. We will treat with antibiotics short course of steroids.  And gentle diuresis. Close follow-up in 1 week.  Plan  Patient Instructions  Covid test .  Chest xray today .  Labs today  Zpack take as directed  Prednisone taper over next week Take extra lasix x 1 dose .  Low salt diet  Oxygen 2-3 l/m with activity as needed and At bedtime  To keep sats>88-90%.  Follow up in 1 week with Dr. [name]  And As needed   Please contact office for sooner follow up if symptoms do not improve or worsen or seek emergency care

## 2020-02-03 NOTE — Patient Instructions (Addendum)
Covid test .  Chest xray today .  Labs today  Zpack take as directed  Prednisone taper over next week Take extra lasix x 1 dose .  Low salt diet  Oxygen 2-3 l/m with activity as needed and At bedtime  To keep sats>88-90%.  Follow up in 1 week with Dr. [name]  And As needed   Please contact office for sooner follow up if symptoms do not improve or worsen or seek emergency care   Late add, CXR w/ Bialteral Aspdz - change Augmentin 828m Twice daily  For 1 week, do not pick up ZUrania  Take extra lasix x 2 days .  ER if not improving or worse.  Covid test as directed.

## 2020-02-04 LAB — BASIC METABOLIC PANEL
BUN: 21 mg/dL (ref 6–23)
CO2: 33 mEq/L — ABNORMAL HIGH (ref 19–32)
Calcium: 9.5 mg/dL (ref 8.4–10.5)
Chloride: 101 mEq/L (ref 96–112)
Creatinine, Ser: 0.87 mg/dL (ref 0.40–1.20)
GFR: 63.76 mL/min (ref >60.00)
Glucose, Bld: 188 mg/dL — ABNORMAL HIGH (ref 70–99)
Potassium: 4.6 mEq/L (ref 3.5–5.1)
Sodium: 139 mEq/L (ref 135–145)

## 2020-02-04 LAB — CBC WITH DIFFERENTIAL/PLATELET
Basophils Absolute: 0.1 10*3/uL (ref 0.0–0.1)
Basophils Relative: 0.9 % (ref 0.0–3.0)
Eosinophils Absolute: 0.2 10*3/uL (ref 0.0–0.7)
Eosinophils Relative: 2.9 % (ref 0.0–5.0)
HCT: 46.1 % — ABNORMAL HIGH (ref 36.0–46.0)
Hemoglobin: 14.9 g/dL (ref 12.0–15.0)
Lymphocytes Relative: 37.2 % (ref 12.0–46.0)
Lymphs Abs: 2.5 10*3/uL (ref 0.7–4.0)
MCHC: 32.2 g/dL (ref 30.0–36.0)
MCV: 91.9 fl (ref 78.0–100.0)
Monocytes Absolute: 0.7 10*3/uL (ref 0.1–1.0)
Monocytes Relative: 11 % (ref 3.0–12.0)
Neutro Abs: 3.2 10*3/uL (ref 1.4–7.7)
Neutrophils Relative %: 48 % (ref 43.0–77.0)
Platelets: 206 10*3/uL (ref 150.0–400.0)
RBC: 5.01 Mil/uL (ref 3.87–5.11)
RDW: 14.9 % (ref 11.5–15.5)
WBC: 6.6 10*3/uL (ref 4.0–10.5)

## 2020-02-04 LAB — BRAIN NATRIURETIC PEPTIDE: Pro B Natriuretic peptide (BNP): 158 pg/mL — ABNORMAL HIGH (ref 0.0–100.0)

## 2020-02-04 NOTE — Telephone Encounter (Signed)
Patient has messaged via MyChart asking about her lab results. Please advise.

## 2020-02-05 NOTE — Telephone Encounter (Signed)
I received  COVID results this AM and it was negative again-attached. I'm continuing  on Lasix, Amoxicillin and Predizone. Any advice? Still no fever   Message routed to Tammy,NP   Patient is scheduled 02/10/20 at 3pm with Tammy,NP

## 2020-02-08 NOTE — Progress Notes (Signed)
Patient called with result notes from Rexene Edison, NP. Patient verbalized understanding of results and plan of care.

## 2020-02-10 ENCOUNTER — Ambulatory Visit: Payer: Medicare Other | Admitting: Adult Health

## 2020-02-10 ENCOUNTER — Encounter: Payer: Self-pay | Admitting: Adult Health

## 2020-02-10 ENCOUNTER — Other Ambulatory Visit: Payer: Self-pay

## 2020-02-10 VITALS — BP 124/60 | HR 92 | Temp 98.0°F | Ht 62.0 in | Wt 245.6 lb

## 2020-02-10 DIAGNOSIS — J441 Chronic obstructive pulmonary disease with (acute) exacerbation: Secondary | ICD-10-CM | POA: Diagnosis not present

## 2020-02-10 DIAGNOSIS — J9612 Chronic respiratory failure with hypercapnia: Secondary | ICD-10-CM | POA: Diagnosis not present

## 2020-02-10 DIAGNOSIS — I5022 Chronic systolic (congestive) heart failure: Secondary | ICD-10-CM

## 2020-02-10 DIAGNOSIS — J9611 Chronic respiratory failure with hypoxia: Secondary | ICD-10-CM | POA: Diagnosis not present

## 2020-02-10 DIAGNOSIS — J432 Centrilobular emphysema: Secondary | ICD-10-CM

## 2020-02-10 NOTE — Assessment & Plan Note (Signed)
Patient is encouraged on oxygen compliance.  Keep O2 saturations greater than 88 to 90%.  Plan  Patient Instructions  Continue on Oxygen 2 at rest /bedtime and 3 l/m with activity  To keep sats>88-90%.  Continue on ANORO daily  Albuterol As needed   Resume lasix 51m daily .  Check labs today .  Low salt diet .  Follow up in 2-3 weeks with chest xray and As needed   Please contact office for sooner follow up if symptoms do not improve or worsen or seek emergency care

## 2020-02-10 NOTE — Progress Notes (Signed)
_0  ID: Rebecca Mcintyre, female    DOB: Mar 07, 1946, 74 y.o.   MRN: [id number]  Chief Complaint  Patient presents with  . Follow-up    COPD     Referring provider: Burnis Medin, MD  HPI: 74 year old female former smoker followed for COPD and chronic hypoxic respiratory failure on oxygen Medical history significant for diabetes  TEST/EVENTS :    PFTs8/08/2018 FVC 2.31 (82), FEV1 1.20 (56), ratio 52, no bronchodilatory response,DLCO 51 %  Imaging 6/10/20CTAChest-No PE; cardiomegaly andemphysematous changes of the lung  NPSG7/19/20 no obstructive sleep apnea, min O2 89%. Periodic limb movements did occur during sleep  02/10/2020 Follow up : COPD  Patient presents for a 1 week follow-up.  Patient was seen last visit for COPD exacerbation.  She was given a Z-Pak and a prednisone taper.  Recommend to take an extra Lasix as she has some suspected volume overload. Chest x-ray showed hazy bilateral airspace disease possibly secondary to edema.  Lab work showed minimally elevated BNP at 158.  Covid testing was negative. Since last visit patient is feeling better with less cough and dyspnea. Has less swelling , Weight is down 8 pounds She remains on Anoro daily.  She uses oxygen with activity and at bedtime. Does feel that since she has been sick the last couple weeks that she is needing more oxygen with lower oxygen levels.  Patient was encouraged on compliance and advised on potential complications of hypoxemia.  Allergies  Allergen Reactions  . Atorvastatin     REACTION: myalgias  and liver    Immunization History  Administered Date(s) Administered  . Fluad Quad(high Dose 65+) 02/11/2019  . Influenza Split 04/29/2011, 04/08/2012  . Influenza Whole 02/16/2008, 03/15/2009, 03/20/2010  . Influenza, High Dose Seasonal PF 04/14/2013, 02/14/2015, 02/28/2016, 03/10/2017, 03/25/2018  . Influenza,inj,Quad PF,6+ Mos 04/18/2014  . Influenza-Unspecified 02/24/2017  . PFIZER  SARS-COV-2 Vaccination 06/15/2019, 07/06/2019  . Pneumococcal Conjugate-13 04/08/2012  . Pneumococcal Polysaccharide-23 04/14/2013  . Td 09/14/2007  . Zoster 09/14/2007    Past Medical History:  Diagnosis Date  . Allergy   . ASCUS on Pap smear    had repeat x 2 normal with [name] Plan 2011  . Coronary artery disease   . Gout   . Hyperlipidemia   . Hypertension   . Myocardial infarction [facility])    2002 95% proximal LAD stenosis with thrombus followed by 90% stenosis, the first diagonal 80% stenosis, circumflex 30% stenosis, circumflex obtuse marginal subbranch 70% stenosis, PDA 40% stenosis. She had a drug-eluting stent placement with a Pixel stent  . Osteopenia   . Solitary kidney     Tobacco History: Social History   Tobacco Use  Smoking Status Former Smoker  . Packs/day: 1.00  . Years: 39.00  . Pack years: 39.00  . Types: Cigarettes  . Quit date: 11/04/2018  . Years since quitting: 1.2  Smokeless Tobacco Never Used  Tobacco Comment   39   Counseling given: Not Answered Comment: 39   Outpatient Medications Prior to Visit  Medication Sig Dispense Refill  . acetaminophen (TYLENOL) 500 MG tablet Take 1,000 mg by mouth every 6 (six) hours as needed for mild pain.    [name] albuterol (VENTOLIN HFA) 108 (90 Base) MCG/ACT inhaler Inhale 2 puffs into the lungs every 6 (six) hours as needed for wheezing or shortness of breath. 18 g 3  . allopurinol (ZYLOPRIM) 100 MG tablet TAKE 1 TABLET (100 MG TOTAL) BY MOUTH 2 (TWO) TIMES DAILY. 180 tablet 0  .  amoxicillin-clavulanate (AUGMENTIN) 875-125 MG tablet Take 1 tablet by mouth 2 (two) times daily for 7 days. 14 tablet 0  . aspirin 81 MG tablet Take 81 mg by mouth daily.    . furosemide (LASIX) 40 MG tablet TAKE 1 TABLET BY MOUTH EVERY DAY 90 tablet 0  . hydrocortisone 2.5 % cream Apply topically 2 (two) times daily. 30 g 0  . ketoconazole (NIZORAL) 2 % cream Apply 1 application topically 2 (two) times daily. 30 g 0  . KLOR-CON M20 20  MEQ tablet TAKE 1 TABLET BY MOUTH EVERY DAY 30 tablet 2  . metFORMIN (GLUCOPHAGE) 500 MG tablet TAKE 1 TABLET (500 MG TOTAL) BY MOUTH 2 (TWO) TIMES DAILY WITH A MEAL. 180 tablet 1  . metoprolol tartrate (LOPRESSOR) 25 MG tablet TAKE 1/2 TABLETS BY MOUTH 2 TIMES DAILY 90 tablet 0  . montelukast (SINGULAIR) 10 MG tablet TAKE 1 TABLET (10 MG TOTAL) AT BEDTIME BY MOUTH. 90 tablet 0  . nitroGLYCERIN (NITROSTAT) 0.4 MG SL tablet Place 1 tablet (0.4 mg total) under the tongue every 5 (five) minutes as needed. 30 tablet 0  . predniSONE (DELTASONE) 10 MG tablet 4 tabs for 2 days, then 3 tabs for 2 days, 2 tabs for 2 days, then 1 tab for 2 days, then stop 20 tablet 0  . rOPINIRole (REQUIP) 0.5 MG tablet START 1/2 TAB AT BEDTIME X 2 NIGHTS THEN INCREASE TO 1 TAB AT BEDTIME FOR RESTLESS LEG 90 tablet 2  . simvastatin (ZOCOR) 40 MG tablet TAKE 1 TABLET BY MOUTH AT BEDTIME 90 tablet 0  . umeclidinium-vilanterol (ANORO ELLIPTA) 62.5-25 MCG/INH AEPB TAKE 1 PUFF BY MOUTH EVERY DAY 60 each 2   No facility-administered medications prior to visit.     Review of Systems:   Constitutional:   No  weight loss, night sweats,  Fevers, chills, fatigue, or  lassitude.  HEENT:   No headaches,  Difficulty swallowing,  Tooth/dental problems, or  Sore throat,                No sneezing, itching, ear ache, nasal congestion, post nasal drip,   CV:  No chest pain,  Orthopnea, PND, +swelling in lower extremities, anasarca, dizziness, palpitations, syncope.   GI  No heartburn, indigestion, abdominal pain, nausea, vomiting, diarrhea, change in bowel habits, loss of appetite, bloody stools.   Resp:    No chest wall deformity  Skin: no rash or lesions.  GU: no dysuria, change in color of urine, no urgency or frequency.  No flank pain, no hematuria   MS:  No joint pain or swelling.  No decreased range of motion.  No back pain.    Physical Exam  BP 124/60 (BP Location: Left Arm, Cuff Size: Normal)   Pulse 92   Temp 98  F (36.7 C) (Temporal)   Ht _0  (1.575 m)   Wt 245 lb 9.6 oz (111.4 kg)   SpO2 92% Comment: RA  BMI 44.92 kg/m   GEN: A/Ox3; pleasant , NAD, well nourished    HEENT:  [place]/AT,    NOSE-clear, THROAT-clear, no lesions, no postnasal drip or exudate noted.   NECK:  Supple w/ fair ROM; no JVD; normal carotid impulses w/o bruits; no thyromegaly or nodules palpated; no lymphadenopathy.    RESP  Clear  P & A; w/o, wheezes/ rales/ or rhonchi. no accessory muscle use, no dullness to percussion  CARD:  RRR, no m/r/g, tr-1 peripheral edema, pulses intact, no cyanosis or clubbing.  GI:  Soft & nt; nml bowel sounds; no organomegaly or masses detected.   Musco: Warm bil, no deformities or joint swelling noted.   Neuro: alert, no focal deficits noted.    Skin: Warm, no lesions or rashes    Lab Results:  CBC    Component Value Date/Time   WBC 6.6 02/03/2020 1555   RBC 5.01 02/03/2020 1555   HGB 14.9 02/03/2020 1555   HCT 46.1 (H) 02/03/2020 1555   PLT 206.0 02/03/2020 1555   MCV 91.9 02/03/2020 1555   MCH 27.8 01/01/2019 0948   MCHC 32.2 02/03/2020 1555   RDW 14.9 02/03/2020 1555   LYMPHSABS 2.5 02/03/2020 1555   MONOABS 0.7 02/03/2020 1555   EOSABS 0.2 02/03/2020 1555   BASOSABS 0.1 02/03/2020 1555    BMET    Component Value Date/Time   NA 139 02/03/2020 1555   K 4.6 02/03/2020 1555   CL 101 02/03/2020 1555   CO2 33 (H) 02/03/2020 1555   GLUCOSE 188 (H) 02/03/2020 1555   GLUCOSE 121 (H) 03/25/2006 0850   BUN 21 02/03/2020 1555   CREATININE 0.87 02/03/2020 1555   CALCIUM 9.5 02/03/2020 1555   GFRNONAA >60 11/12/2018 0954   GFRAA >60 11/12/2018 0954    BNP    Component Value Date/Time   BNP 586.9 (H) 11/04/2018 1105    ProBNP    Component Value Date/Time   PROBNP 158.0 (H) 02/03/2020 1555    Imaging: DG Chest 2 View  Result Date: 02/03/2020 CLINICAL DATA:  COPD exacerbation. EXAM: CHEST - 2 VIEW COMPARISON:  PA and lateral chest 12/22/2018.  CT chest  11/04/2018. FINDINGS: The lungs are emphysematous. There is cardiomegaly. Hazy airspace disease bilaterally with some nodularity is present. No pneumothorax or pleural fluid. Aortic atherosclerosis noted. No acute or focal bony abnormality. IMPRESSION: Hazy bilateral airspace disease could be due to pulmonary edema in this patient with cardiomegaly but may also reflect pneumonia. No pleural effusion is present. Aortic Atherosclerosis (ICD10-I70.0) and Emphysema (ICD10-J43.9). Electronically Signed   By: Inge Rise M.D.   On: 02/03/2020 16:07      PFT Results Latest Ref Rng & Units 12/29/2018  FVC-Pre L 2.28  FVC-Predicted Pre % 81  FVC-Post L 2.31  FVC-Predicted Post % 82  Pre FEV1/FVC % % 51  Post FEV1/FCV % % 52  FEV1-Pre L 1.17  FEV1-Predicted Pre % 55  FEV1-Post L 1.20  DLCO uncorrected ml/min/mmHg 9.62  DLCO UNC% % 51  DLVA Predicted % 67  TLC L 5.11  TLC % Predicted % 104  RV % Predicted % 128    No results found for: NITRICOXIDE      Assessment & Plan:   COPD (chronic obstructive pulmonary disease) (HCC) Recent exacerbation now improving.  Plan  Patient Instructions  Continue on Oxygen 2 at rest /bedtime and 3 l/m with activity  To keep sats>88-90%.  Continue on ANORO daily  Albuterol As needed   Resume lasix 28m daily .  Check labs today .  Low salt diet .  Follow up in 2-3 weeks with chest xray and As needed   Please contact office for sooner follow up if symptoms do not improve or worsen or seek emergency care       Chronic respiratory failure with hypoxia and [place]nia ([place] is encouraged on oxygen compliance.  Keep O2 saturations greater than 88 to 90%.  Plan  Patient Instructions  Continue on Oxygen 2 at rest /bedtime and 3 l/m with activity  To keep  sats>88-90%.  Continue on ANORO daily  Albuterol As needed   Resume lasix 53m daily .  Check labs today .  Low salt diet .  Follow up in 2-3 weeks with chest xray and As needed     Please contact office for sooner follow up if symptoms do not improve or worsen or seek emergency care       Chronic systolic HF (heart failure) ([place] Patient appeared to have decompensation with volume overload.  Much improved with diuresis.  Check be met today.  Continue low-salt diet.  Resume Lasix 40 mg daily     TRexene Edison NP 02/10/2020

## 2020-02-10 NOTE — Assessment & Plan Note (Signed)
Patient appeared to have decompensation with volume overload.  Much improved with diuresis.  Check be met today.  Continue low-salt diet.  Resume Lasix 40 mg daily

## 2020-02-10 NOTE — Patient Instructions (Addendum)
Continue on Oxygen 2 at rest /bedtime and 3 l/m with activity  To keep sats>88-90%.  Continue on ANORO daily  Albuterol As needed   Resume lasix 51m daily .  Check labs today .  Low salt diet .  Follow up in 2-3 weeks with chest xray and As needed   Please contact office for sooner follow up if symptoms do not improve or worsen or seek emergency care

## 2020-02-10 NOTE — Assessment & Plan Note (Signed)
Recent exacerbation now improving.  Plan  Patient Instructions  Continue on Oxygen 2 at rest /bedtime and 3 l/m with activity  To keep sats>88-90%.  Continue on ANORO daily  Albuterol As needed   Resume lasix 18m daily .  Check labs today .  Low salt diet .  Follow up in 2-3 weeks with chest xray and As needed   Please contact office for sooner follow up if symptoms do not improve or worsen or seek emergency care

## 2020-02-11 LAB — BASIC METABOLIC PANEL
BUN: 34 mg/dL — ABNORMAL HIGH (ref 6–23)
CO2: 30 mEq/L (ref 19–32)
Calcium: 9.4 mg/dL (ref 8.4–10.5)
Chloride: 92 mEq/L — ABNORMAL LOW (ref 96–112)
Creatinine, Ser: 1.11 mg/dL (ref 0.40–1.20)
GFR: 48.13 mL/min — ABNORMAL LOW (ref >60.00)
Glucose, Bld: 498 mg/dL — ABNORMAL HIGH (ref 70–99)
Potassium: 4.8 mEq/L (ref 3.5–5.1)
Sodium: 131 mEq/L — ABNORMAL LOW (ref 135–145)

## 2020-02-11 NOTE — Progress Notes (Signed)
Contacted patient with results of recent lab work. Result notes from [facility] reviewed. Patient verbalized understanding of results and ongoing plan of care.

## 2020-02-12 DIAGNOSIS — J449 Chronic obstructive pulmonary disease, unspecified: Secondary | ICD-10-CM | POA: Diagnosis not present

## 2020-02-12 DIAGNOSIS — I5033 Acute on chronic diastolic (congestive) heart failure: Secondary | ICD-10-CM | POA: Diagnosis not present

## 2020-02-28 ENCOUNTER — Ambulatory Visit: Payer: Medicare Other

## 2020-02-28 ENCOUNTER — Other Ambulatory Visit: Payer: Self-pay | Admitting: *Deleted

## 2020-02-28 DIAGNOSIS — J441 Chronic obstructive pulmonary disease with (acute) exacerbation: Secondary | ICD-10-CM

## 2020-02-28 NOTE — Telephone Encounter (Signed)
I was out of office    Last week  Please have her make appt this week. ( virtual is ok )

## 2020-02-29 ENCOUNTER — Other Ambulatory Visit: Payer: Self-pay

## 2020-02-29 ENCOUNTER — Encounter: Payer: Self-pay | Admitting: Adult Health

## 2020-02-29 ENCOUNTER — Ambulatory Visit (INDEPENDENT_AMBULATORY_CARE_PROVIDER_SITE_OTHER): Payer: Medicare Other | Admitting: Adult Health

## 2020-02-29 ENCOUNTER — Ambulatory Visit (INDEPENDENT_AMBULATORY_CARE_PROVIDER_SITE_OTHER): Payer: Medicare Other

## 2020-02-29 VITALS — BP 124/80 | HR 83 | Ht 62.0 in | Wt 247.0 lb

## 2020-02-29 DIAGNOSIS — J439 Emphysema, unspecified: Secondary | ICD-10-CM | POA: Diagnosis not present

## 2020-02-29 DIAGNOSIS — Z23 Encounter for immunization: Secondary | ICD-10-CM

## 2020-02-29 DIAGNOSIS — J441 Chronic obstructive pulmonary disease with (acute) exacerbation: Secondary | ICD-10-CM

## 2020-02-29 DIAGNOSIS — J9611 Chronic respiratory failure with hypoxia: Secondary | ICD-10-CM | POA: Diagnosis not present

## 2020-02-29 DIAGNOSIS — I5032 Chronic diastolic (congestive) heart failure: Secondary | ICD-10-CM

## 2020-02-29 DIAGNOSIS — J811 Chronic pulmonary edema: Secondary | ICD-10-CM | POA: Diagnosis not present

## 2020-02-29 DIAGNOSIS — J432 Centrilobular emphysema: Secondary | ICD-10-CM

## 2020-02-29 DIAGNOSIS — J449 Chronic obstructive pulmonary disease, unspecified: Secondary | ICD-10-CM | POA: Diagnosis not present

## 2020-02-29 DIAGNOSIS — J9612 Chronic respiratory failure with hypercapnia: Secondary | ICD-10-CM | POA: Diagnosis not present

## 2020-02-29 MED ORDER — ALBUTEROL SULFATE (2.5 MG/3ML) 0.083% IN NEBU: 2.5000 mg | INHALATION_SOLUTION | Freq: Four times a day (QID) | RESPIRATORY_TRACT | 11 refills | Status: DC | PRN

## 2020-02-29 NOTE — Progress Notes (Signed)
_0  ID: [name], female    DOB: 08-27-1945, 74 y.o.   MRN: [id number]  Chief Complaint  Patient presents with  . Follow-up    COPD     Referring provider: Burnis Medin, MD  HPI: 74 yo female former smoker followed for COPD and Chronic Hypoxic Respiratory Failure on Oxygen  Medical history significant for DM,  Identical twin, has only 1 kidney (left only )  Participates in research study in [place]-COPD study  TEST/EVENTS :  PFTs8/08/2018 FVC 2.31 (82), FEV1 1.20 (56), ratio 52, no bronchodilatory response,DLCO 51 %  Imaging 6/10/20CTAChest-No PE; cardiomegaly andemphysematous changes of the lung  2D echo November 04, 2018 EF 6065%, RV has moderately reduced systolic function, moderately enlarged and severely elevated pressure at 70.3 mmHg.  Right atrium is moderately dilated.  And tricuspid valve with mild to moderate regurg  2D echo January 19, 2019 showed EF normal, grade 2 diastolic dysfunction, normal RV size and function compared to echo November 04, 2018 showed significant decrease in RV size and improvement in RV function.  Right atrial pressure estimated at 3 mmHg and right atrial size is normal  ABG November 05, 2018 pH 7.278/PCO2 85, PO2 175.  NPSG7/19/20 no obstructive sleep apnea, min O2 89%. Periodic limb movements did occur during sleep  Admitted 10/2019 with acute cor pulmonale, improved with diuresis , CTa neg for PE . Follow up echo showed improvement     02/29/2020 Follow up : COPD , O2 RF  Patient returns for a 3-week follow-up.  Patient was seen last visit for a slow to resolve COPD exacerbation.  She was suspected to have some component of some volume overload.  Chest x-ray showed hazy bilateral airspace disease possibly secondary to edema.  BNP was minimally elevated at 158.  COVID-19 testing was negative.  Patient was restarted on Lasix 40 mg.  Patient did have some weight loss and decreased leg swelling. Patient remains on oxygen 2 L at  rest and 3 L with activity. She denies any increased oxygen demands.  She says she has had some good days since last visit.  She is continues to get short of breath with heavy activity.  Chest x-ray today shows similar appearance with diffuse interstitial thickening superimposed on emphysematous changes.  Patient is independent lives at home and drives.    Allergies  Allergen Reactions  . Atorvastatin     REACTION: myalgias  and liver    Immunization History  Administered Date(s) Administered  . Fluad Quad(high Dose 65+) 02/11/2019, 02/29/2020  . Influenza Split 04/29/2011, 04/08/2012  . Influenza Whole 02/16/2008, 03/15/2009, 03/20/2010  . Influenza, High Dose Seasonal PF 04/14/2013, 02/14/2015, 02/28/2016, 03/10/2017, 03/25/2018  . Influenza,inj,Quad PF,6+ Mos 04/18/2014  . Influenza,inj,quad, With Preservative 02/25/2019  . Influenza-Unspecified 02/24/2017  . PFIZER SARS-COV-2 Vaccination 06/15/2019, 07/06/2019  . Pneumococcal Conjugate-13 04/08/2012  . Pneumococcal Polysaccharide-23 04/14/2013  . Td 09/14/2007  . Zoster 09/14/2007    Past Medical History:  Diagnosis Date  . Allergy   . ASCUS on Pap smear    had repeat x 2 normal with [name] Plan 2011  . Coronary artery disease   . Gout   . Hyperlipidemia   . Hypertension   . Myocardial infarction [facility])    2002 95% proximal LAD stenosis with thrombus followed by 90% stenosis, the first diagonal 80% stenosis, circumflex 30% stenosis, circumflex obtuse marginal subbranch 70% stenosis, PDA 40% stenosis. She had a drug-eluting stent placement with a Pixel stent  . Osteopenia   .  Solitary kidney     Tobacco History: Social History   Tobacco Use  Smoking Status Former Smoker  . Packs/day: 1.00  . Years: 39.00  . Pack years: 39.00  . Types: Cigarettes  . Quit date: 11/04/2018  . Years since quitting: 1.3  Smokeless Tobacco Never Used  Tobacco Comment   39   Counseling given: Not Answered Comment:  39   Outpatient Medications Prior to Visit  Medication Sig Dispense Refill  . acetaminophen (TYLENOL) 500 MG tablet Take 1,000 mg by mouth every 6 (six) hours as needed for mild pain.    [name] albuterol (VENTOLIN HFA) 108 (90 Base) MCG/ACT inhaler Inhale 2 puffs into the lungs every 6 (six) hours as needed for wheezing or shortness of breath. 18 g 3  . allopurinol (ZYLOPRIM) 100 MG tablet TAKE 1 TABLET (100 MG TOTAL) BY MOUTH 2 (TWO) TIMES DAILY. 180 tablet 0  . aspirin 81 MG tablet Take 81 mg by mouth daily.    . furosemide (LASIX) 40 MG tablet TAKE 1 TABLET BY MOUTH EVERY DAY 90 tablet 0  . hydrocortisone 2.5 % cream Apply topically 2 (two) times daily. 30 g 0  . ketoconazole (NIZORAL) 2 % cream Apply 1 application topically 2 (two) times daily. 30 g 0  . KLOR-CON M20 20 MEQ tablet TAKE 1 TABLET BY MOUTH EVERY DAY 30 tablet 2  . metFORMIN (GLUCOPHAGE) 500 MG tablet TAKE 1 TABLET (500 MG TOTAL) BY MOUTH 2 (TWO) TIMES DAILY WITH A MEAL. 180 tablet 1  . metoprolol tartrate (LOPRESSOR) 25 MG tablet TAKE 1/2 TABLETS BY MOUTH 2 TIMES DAILY 90 tablet 0  . montelukast (SINGULAIR) 10 MG tablet TAKE 1 TABLET (10 MG TOTAL) AT BEDTIME BY MOUTH. 90 tablet 0  . nitroGLYCERIN (NITROSTAT) 0.4 MG SL tablet Place 1 tablet (0.4 mg total) under the tongue every 5 (five) minutes as needed. 30 tablet 0  . rOPINIRole (REQUIP) 0.5 MG tablet START 1/2 TAB AT BEDTIME X 2 NIGHTS THEN INCREASE TO 1 TAB AT BEDTIME FOR RESTLESS LEG 90 tablet 2  . simvastatin (ZOCOR) 40 MG tablet TAKE 1 TABLET BY MOUTH AT BEDTIME 90 tablet 0  . umeclidinium-vilanterol (ANORO ELLIPTA) 62.5-25 MCG/INH AEPB TAKE 1 PUFF BY MOUTH EVERY DAY 60 each 2  . predniSONE (DELTASONE) 10 MG tablet 4 tabs for 2 days, then 3 tabs for 2 days, 2 tabs for 2 days, then 1 tab for 2 days, then stop 20 tablet 0   No facility-administered medications prior to visit.     Review of Systems:   Constitutional:   No  weight loss, night sweats,  Fevers, chills, +  fatigue, or  lassitude.  HEENT:   No headaches,  Difficulty swallowing,  Tooth/dental problems, or  Sore throat,                No sneezing, itching, ear ache, nasal congestion, post nasal drip,   CV:  No chest pain,  Orthopnea, PND, +swelling in lower extremities, no anasarca, dizziness, palpitations, syncope.   GI  No heartburn, indigestion, abdominal pain, nausea, vomiting, diarrhea, change in bowel habits, loss of appetite, bloody stools.   Resp:  .  No chest wall deformity  Skin: no rash or lesions.  GU: no dysuria, change in color of urine, no urgency or frequency.  No flank pain, no hematuria   MS:  No joint pain or swelling.  No decreased range of motion.  No back pain.    Physical Exam  BP  124/80 (BP Location: Left Arm, Cuff Size: Normal)   Pulse 83   Ht _0  (1.575 m)   Wt 247 lb (112 kg)   SpO2 90%   BMI 45.18 kg/m   GEN: A/Ox3; pleasant , NAD, well nourished    HEENT:  [place]/AT,   NOSE-clear, THROAT-clear, no lesions, no postnasal drip or exudate noted.   NECK:  Supple w/ fair ROM; no JVD; normal carotid impulses w/o bruits; no thyromegaly or nodules palpated; no lymphadenopathy.    RESP  Few trace rhonchi, no accessory muscle use, no dullness to percussion  CARD:  RRR, no m/r/g, 1+ peripheral edema, pulses intact, no cyanosis or clubbing.  GI:   Soft & nt; nml bowel sounds; no organomegaly or masses detected.   Musco: Warm bil, no deformities or joint swelling noted.   Neuro: alert, no focal deficits noted.    Skin: Warm, no lesions or rashes    Lab Results:  CBC    Component Value Date/Time   WBC 6.6 02/03/2020 1555   RBC 5.01 02/03/2020 1555   HGB 14.9 02/03/2020 1555   HCT 46.1 (H) 02/03/2020 1555   PLT 206.0 02/03/2020 1555   MCV 91.9 02/03/2020 1555   MCH 27.8 01/01/2019 0948   MCHC 32.2 02/03/2020 1555   RDW 14.9 02/03/2020 1555   LYMPHSABS 2.5 02/03/2020 1555   MONOABS 0.7 02/03/2020 1555   EOSABS 0.2 02/03/2020 1555   BASOSABS 0.1  02/03/2020 1555    BMET    Component Value Date/Time   NA 131 (L) 02/10/2020 1545   K 4.8 02/10/2020 1545   CL 92 (L) 02/10/2020 1545   CO2 30 02/10/2020 1545   GLUCOSE 498 (H) 02/10/2020 1545   GLUCOSE 121 (H) 03/25/2006 0850   BUN 34 (H) 02/10/2020 1545   CREATININE 1.11 02/10/2020 1545   CALCIUM 9.4 02/10/2020 1545   GFRNONAA >60 11/12/2018 0954   GFRAA >60 11/12/2018 0954    BNP    Component Value Date/Time   BNP 586.9 (H) 11/04/2018 1105    ProBNP    Component Value Date/Time   PROBNP 158.0 (H) 02/03/2020 1555    Imaging: DG Chest 2 View  Result Date: 02/29/2020 CLINICAL DATA:  COPD EXAM: CHEST - 2 VIEW COMPARISON:  February 03, 2020 FINDINGS: Lungs are borderline hyperexpanded. There remains fairly diffuse interstitial thickening throughout the lungs, likely representing a degree of interstitial pulmonary edema. There appears to be relative bullous disease in the upper lobes. Heart is mildly enlarged with diminished vascularity in the upper lobes, likely due to underlying emphysematous change in relative prominence of lower lobes in part due to redistribution of blood flow to viable lung segments. A degree of mild volume overload is questioned as well. There is aortic atherosclerosis. No adenopathy. No bone lesions. IMPRESSION: Appearance similar to 1 month prior. There is a degree of underlying emphysematous change. Question a degree of interstitial edema and volume overload superimposed on emphysema. There may also be a degree of underlying scarring and chronic bronchitis change. No consolidation evident. Aortic Atherosclerosis (ICD10-I70.0). Electronically Signed   By: Lowella Grip III M.D.   On: 02/29/2020 10:10   DG Chest 2 View  Result Date: 02/03/2020 CLINICAL DATA:  COPD exacerbation. EXAM: CHEST - 2 VIEW COMPARISON:  PA and lateral chest 12/22/2018.  CT chest 11/04/2018. FINDINGS: The lungs are emphysematous. There is cardiomegaly. Hazy airspace disease  bilaterally with some nodularity is present. No pneumothorax or pleural fluid. Aortic atherosclerosis noted. No acute or  focal bony abnormality. IMPRESSION: Hazy bilateral airspace disease could be due to pulmonary edema in this patient with cardiomegaly but may also reflect pneumonia. No pleural effusion is present. Aortic Atherosclerosis (ICD10-I70.0) and Emphysema (ICD10-J43.9). Electronically Signed   By: Inge Rise M.D.   On: 02/03/2020 16:07      PFT Results Latest Ref Rng & Units 12/29/2018  FVC-Pre L 2.28  FVC-Predicted Pre % 81  FVC-Post L 2.31  FVC-Predicted Post % 82  Pre FEV1/FVC % % 51  Post FEV1/FCV % % 52  FEV1-Pre L 1.17  FEV1-Predicted Pre % 55  FEV1-Post L 1.20  DLCO uncorrected ml/min/mmHg 9.62  DLCO UNC% % 51  DLVA Predicted % 67  TLC L 5.11  TLC % Predicted % 104  RV % Predicted % 128    No results found for: NITRICOXIDE      Assessment & Plan:   COPD (chronic obstructive pulmonary disease) (HCC) Moderate to severe COPD with high symptom burden.  Patient also has a component of diastolic dysfunction. She has underlying chronic kidney disease and with only 1 kidney and underlying diabetes need to be cautious with diuretics.  We will check bmet today. Add albuterol nebulizer as needed continue on dual long-acting bronchodilators with Anoro. Patient had repeat echo last August that showed improved function with resolution of acute cor pulmonale.   Plan  Patient Instructions  Continue on Oxygen 2 at rest /bedtime and 3 l/m with activity  To keep sats>88-90%.  Check ABG .  Continue on ANORO daily  Albuterol As needed   Add Albuterol Neb , may use as needed only -every 4-6hrs  Continue on Lasix 32m daily .  Check labs today .  Low salt diet.  Flu shot today .  Follow up in 2 months with Dr. AElsworth Soho and As needed   Please contact office for sooner follow up if symptoms do not improve or worsen or seek emergency care         Chronic respiratory  failure with hypoxia and hypercapnia ([place] Chronic hypoxic and hypercarbic respiratory failure.  Patient with hypercarbic respiratory failure last year with hospitalization. Work-up for sleep apnea was negative.  May have a component of OHS/hypercarbia with underlying moderate to severe COPD.  Will check ABG at her baseline .  If qualifies would be a good candidate for noninvasive support at bedtime/naps Continue on oxygen keep O2 saturations greater than 88 to 90%.  Plan  Patient Instructions  Continue on Oxygen 2 at rest /bedtime and 3 l/m with activity  To keep sats>88-90%.  Check ABG .  Continue on ANORO daily  Albuterol As needed   Add Albuterol Neb , may use as needed only -every 4-6hrs  Continue on Lasix 452mdaily .  Check labs today .  Low salt diet.  Flu shot today .  Follow up in 2 months with Dr. AlElsworth Sohoand As needed   Please contact office for sooner follow up if symptoms do not improve or worsen or seek emergency care       CHF (congestive heart failure) ([facility] heart failure with mild decompensation.  Patient has restarted Lasix.  Does not appear to be in acute overload.  Will check labs to follow-up renal function.  Plan  Patient Instructions  Continue on Oxygen 2 at rest /bedtime and 3 l/m with activity  To keep sats>88-90%.  Check ABG .  Continue on ANORO daily  Albuterol As needed   Add Albuterol Neb ,  may use as needed only -every 4-6hrs  Continue on Lasix 54m daily .  Check labs today .  Low salt diet.  Flu shot today .  Follow up in 2 months with Dr. AElsworth Soho and As needed   Please contact office for sooner follow up if symptoms do not improve or worsen or seek emergency care          TRexene Edison NP 02/29/2020

## 2020-02-29 NOTE — Assessment & Plan Note (Signed)
Diastolic heart failure with mild decompensation.  Patient has restarted Lasix.  Does not appear to be in acute overload.  Will check labs to follow-up renal function.  Plan  Patient Instructions  Continue on Oxygen 2 at rest /bedtime and 3 l/m with activity  To keep sats>88-90%.  Check ABG .  Continue on ANORO daily  Albuterol As needed   Add Albuterol Neb , may use as needed only -every 4-6hrs  Continue on Lasix 29m daily .  Check labs today .  Low salt diet.  Flu shot today .  Follow up in 2 months with Dr. AElsworth Soho and As needed   Please contact office for sooner follow up if symptoms do not improve or worsen or seek emergency care

## 2020-02-29 NOTE — Assessment & Plan Note (Signed)
Moderate to severe COPD with high symptom burden.  Patient also has a component of diastolic dysfunction. She has underlying chronic kidney disease and with only 1 kidney and underlying diabetes need to be cautious with diuretics.  We will check bmet today. Add albuterol nebulizer as needed continue on dual long-acting bronchodilators with Anoro. Patient had repeat echo last August that showed improved function with resolution of acute cor pulmonale.   Plan  Patient Instructions  Continue on Oxygen 2 at rest /bedtime and 3 l/m with activity  To keep sats>88-90%.  Check ABG .  Continue on ANORO daily  Albuterol As needed   Add Albuterol Neb , may use as needed only -every 4-6hrs  Continue on Lasix 78m daily .  Check labs today .  Low salt diet.  Flu shot today .  Follow up in 2 months with Dr. AElsworth Soho and As needed   Please contact office for sooner follow up if symptoms do not improve or worsen or seek emergency care

## 2020-02-29 NOTE — Assessment & Plan Note (Signed)
Chronic hypoxic and hypercarbic respiratory failure.  Patient with hypercarbic respiratory failure last year with hospitalization. Work-up for sleep apnea was negative.  May have a component of OHS/hypercarbia with underlying moderate to severe COPD.  Will check ABG at her baseline .  If qualifies would be a good candidate for noninvasive support at bedtime/naps Continue on oxygen keep O2 saturations greater than 88 to 90%.  Plan  Patient Instructions  Continue on Oxygen 2 at rest /bedtime and 3 l/m with activity  To keep sats>88-90%.  Check ABG .  Continue on ANORO daily  Albuterol As needed   Add Albuterol Neb , may use as needed only -every 4-6hrs  Continue on Lasix 66m daily .  Check labs today .  Low salt diet.  Flu shot today .  Follow up in 2 months with Dr. AElsworth Soho and As needed   Please contact office for sooner follow up if symptoms do not improve or worsen or seek emergency care

## 2020-02-29 NOTE — Patient Instructions (Addendum)
Continue on Oxygen 2 at rest /bedtime and 3 l/m with activity  To keep sats>88-90%.  Check ABG .  Continue on ANORO daily  Albuterol As needed   Add Albuterol Neb , may use as needed only -every 4-6hrs  Continue on Lasix 67m daily .  Check labs today .  Low salt diet.  Flu shot today .  Follow up in 2 months with Dr. AElsworth Soho and As needed   Please contact office for sooner follow up if symptoms do not improve or worsen or seek emergency care

## 2020-03-01 ENCOUNTER — Telehealth: Payer: Self-pay | Admitting: Adult Health

## 2020-03-01 NOTE — Telephone Encounter (Signed)
Called the number provided x 2 - line rings multiple times and then cuts off- no vm

## 2020-03-02 ENCOUNTER — Other Ambulatory Visit: Payer: Self-pay | Admitting: *Deleted

## 2020-03-02 DIAGNOSIS — J432 Centrilobular emphysema: Secondary | ICD-10-CM | POA: Diagnosis not present

## 2020-03-02 MED ORDER — ALBUTEROL SULFATE (2.5 MG/3ML) 0.083% IN NEBU: 2.5000 mg | INHALATION_SOLUTION | Freq: Four times a day (QID) | RESPIRATORY_TRACT | 11 refills | Status: DC | PRN

## 2020-03-02 NOTE — Telephone Encounter (Signed)
Per pt's email from 10.7.21 pt wanted script sent to [facility]. Rx was already sent there today. Nothing further needed at this time.

## 2020-03-09 ENCOUNTER — Other Ambulatory Visit: Payer: Self-pay

## 2020-03-09 ENCOUNTER — Ambulatory Visit (HOSPITAL_COMMUNITY)
Admission: RE | Admit: 2020-03-09 | Discharge: 2020-03-09 | Disposition: A | Discharge status: Home / Self Care | Payer: Medicare Other | Source: Ambulatory Visit | Attending: Adult Health | Admitting: Adult Health

## 2020-03-09 DIAGNOSIS — J9612 Chronic respiratory failure with hypercapnia: Secondary | ICD-10-CM | POA: Insufficient documentation

## 2020-03-09 DIAGNOSIS — J9611 Chronic respiratory failure with hypoxia: Secondary | ICD-10-CM | POA: Diagnosis not present

## 2020-03-09 LAB — BLOOD GAS, ARTERIAL
Acid-Base Excess: 4.3 mmol/L — ABNORMAL HIGH (ref 0.0–2.0)
Bicarbonate: 28.5 mmol/L — ABNORMAL HIGH (ref 20.0–28.0)
FIO2: 21
O2 Saturation: 86.9 %
Patient temperature: 37
pCO2 arterial: 44.4 mmHg (ref 32.0–48.0)
pH, Arterial: 7.423 (ref 7.350–7.450)
pO2, Arterial: 52.8 mmHg — ABNORMAL LOW (ref 83.0–108.0)

## 2020-03-11 ENCOUNTER — Other Ambulatory Visit: Payer: Self-pay | Admitting: Internal Medicine

## 2020-03-12 ENCOUNTER — Other Ambulatory Visit: Payer: Self-pay | Admitting: Internal Medicine

## 2020-03-13 DIAGNOSIS — J449 Chronic obstructive pulmonary disease, unspecified: Secondary | ICD-10-CM | POA: Diagnosis not present

## 2020-03-13 DIAGNOSIS — I5033 Acute on chronic diastolic (congestive) heart failure: Secondary | ICD-10-CM | POA: Diagnosis not present

## 2020-03-14 ENCOUNTER — Other Ambulatory Visit: Payer: Self-pay | Admitting: Pulmonary Disease

## 2020-03-14 MED ORDER — ANORO ELLIPTA 62.5-25 MCG/INH IN AEPB: INHALATION_SPRAY | RESPIRATORY_TRACT | 5 refills | Status: DC

## 2020-03-20 NOTE — Addendum Note (Signed)
Addended by: Marrion Coy on: 03/20/2020 03:14 PM   Modules accepted: Orders

## 2020-03-22 ENCOUNTER — Ambulatory Visit (INDEPENDENT_AMBULATORY_CARE_PROVIDER_SITE_OTHER): Payer: Medicare Other

## 2020-03-22 ENCOUNTER — Other Ambulatory Visit: Payer: Self-pay

## 2020-03-22 DIAGNOSIS — Z0000 Encounter for general adult medical examination without abnormal findings: Secondary | ICD-10-CM

## 2020-03-22 NOTE — Progress Notes (Signed)
Virtual Visit via Telephone Note  I connected with  Rebecca Mcintyre on 03/22/20 at 10:15 AM EDT by telephone and verified that I am speaking with the correct person using two identifiers.  Medicare Annual Wellness visit completed telephonically due to Covid-19 pandemic.   Persons participating in this call: This Health Coach and this patient.   Location: Patient: home Provider: office   I discussed the limitations, risks, security and privacy concerns of performing an evaluation and management service by telephone and the availability of in person appointments. The patient expressed understanding and agreed to proceed.  Unable to perform video visit due to video visit attempted and failed and/or patient does not have video capability.   Some vital signs may be absent or patient reported.   Willette Brace, LPN    Subjective:   Rebecca Mcintyre is a 74 y.o. female who presents for Medicare Annual (Subsequent) preventive examination.  Review of Systems     Cardiac Risk Factors include: advanced age (>70mn, >>58women);diabetes mellitus;hypertension;dyslipidemia;obesity (BMI >30kg/m2)     Objective:    There were no vitals filed for this visit. There is no height or weight on file to calculate BMI.  Advanced Directives 03/22/2020 12/13/2018 11/04/2018 12/25/2016 12/11/2016  Does Patient Have a Medical Advance Directive? Yes Yes No Yes Yes  Type of AParamedicof ARhodesLiving will HHolsteinLiving will - - HPlymouthLiving will  Does patient want to make changes to medical advance directive? - No - Patient declined - - -  Copy of H[place]wayin Chart? No - copy requested No - copy requested - - -  Would patient like information on creating a medical advance directive? - - No - Patient declined - -    Current Medications (verified) Outpatient Encounter Medications as of 03/22/2020  Medication Sig  .  acetaminophen (TYLENOL) 500 MG tablet Take 1,000 mg by mouth every 6 (six) hours as needed for mild pain.  .Marland Kitchenalbuterol (PROVENTIL) (2.5 MG/3ML) 0.083% nebulizer solution Take 3 mLs (2.5 mg total) by nebulization every 6 (six) hours as needed for wheezing or shortness of breath.  .Marland Kitchenalbuterol (VENTOLIN HFA) 108 (90 Base) MCG/ACT inhaler Inhale 2 puffs into the lungs every 6 (six) hours as needed for wheezing or shortness of breath.  . allopurinol (ZYLOPRIM) 100 MG tablet TAKE 1 TABLET (100 MG TOTAL) BY MOUTH 2 (TWO) TIMES DAILY.  .Marland Kitchenaspirin 81 MG tablet Take 81 mg by mouth daily.  . furosemide (LASIX) 40 MG tablet TAKE 1 TABLET BY MOUTH EVERY DAY  . KLOR-CON M20 20 MEQ tablet TAKE 1 TABLET BY MOUTH EVERY DAY  . metFORMIN (GLUCOPHAGE) 500 MG tablet TAKE 1 TABLET (500 MG TOTAL) BY MOUTH 2 (TWO) TIMES DAILY WITH A MEAL.  . metoprolol tartrate (LOPRESSOR) 25 MG tablet TAKE 1/2 TABLETS BY MOUTH 2 TIMES DAILY  . montelukast (SINGULAIR) 10 MG tablet TAKE 1 TABLET (10 MG TOTAL) AT BEDTIME BY MOUTH.  . nitroGLYCERIN (NITROSTAT) 0.4 MG SL tablet Place 1 tablet (0.4 mg total) under the tongue every 5 (five) minutes as needed.  .Marland KitchenrOPINIRole (REQUIP) 0.5 MG tablet START 1/2 TAB AT BEDTIME X 2 NIGHTS THEN INCREASE TO 1 TAB AT BEDTIME FOR RESTLESS LEG  . simvastatin (ZOCOR) 40 MG tablet TAKE 1 TABLET BY MOUTH AT BEDTIME  . umeclidinium-vilanterol (ANORO ELLIPTA) 62.5-25 MCG/INH AEPB TAKE 1 PUFF BY MOUTH EVERY DAY  . [DISCONTINUED] hydrocortisone 2.5 % cream Apply  topically 2 (two) times daily. (Patient not taking: Reported on 03/22/2020)  . [DISCONTINUED] ketoconazole (NIZORAL) 2 % cream Apply 1 application topically 2 (two) times daily. (Patient not taking: Reported on 03/22/2020)   No facility-administered encounter medications on file as of 03/22/2020.    Allergies (verified) Atorvastatin   History: Past Medical History:  Diagnosis Date  . Allergy   . ASCUS on Pap smear    had repeat x 2 normal with Dr.  [place] Plan 2011  . Coronary artery disease   . Gout   . Hyperlipidemia   . Hypertension   . Myocardial infarction [facility])    2002 95% proximal LAD stenosis with thrombus followed by 90% stenosis, the first diagonal 80% stenosis, circumflex 30% stenosis, circumflex obtuse marginal subbranch 70% stenosis, PDA 40% stenosis. She had a drug-eluting stent placement with a Pixel stent  . Osteopenia   . Solitary kidney    Past Surgical History:  Procedure Laterality Date  . CARDIAC CATHETERIZATION    . CORONARY ANGIOPLASTY    . CORONARY ANGIOPLASTY WITH STENT PLACEMENT  2002  . [place]  2011  . LAD Stent angioplasty  2002  . Right oophorectomy with salpingectomy     benign growth Dr. Joneen Caraway   Family History  Problem Relation Age of Onset  . Breast cancer Mother 38  . Lung cancer Father   . Heart attack Brother        Died age 58 MI  . Colon cancer Brother   . Heart disease Daughter 37       smoker and overweight   . Esophageal cancer Neg Hx   . Pancreatic cancer Neg Hx   . Rectal cancer Neg Hx   . Stomach cancer Neg Hx    Social History   Socioeconomic History  . Marital status: Single    Spouse name: Not on file  . Number of children: 2  . Years of education: Not on file  . Highest education level: Not on file  Occupational History  . Occupation: semi retired    Fish farm manager: Clinical research associate  Tobacco Use  . Smoking status: Former Smoker    Packs/day: 1.00    Years: 39.00    Pack years: 39.00    Types: Cigarettes    Quit date: 11/04/2018    Years since quitting: 1.3  . Smokeless tobacco: Never Used  . Tobacco comment: 39  Vaping Use  . Vaping Use: Never used  Substance and Sexual Activity  . Alcohol use: Yes    Comment: 1-2 per month  . Drug use: No  . Sexual activity: Not on file  Other Topics Concern  . Not on file  Social History Narrative   HH of 1    Regular exercise-sometimes    6-7 hours sleep   Divorced   Occupation: Cabin crew working full time    Works with daughter    No pets      Social Determinants of Radio broadcast assistant Strain: [place]   . Difficulty of Paying Living Expenses: Not hard at all  Food Insecurity: No Food Insecurity  . Worried About Charity fundraiser in the Last Year: Never true  . Ran Out of Food in the Last Year: Never true  Transportation Needs: No Transportation Needs  . Lack of Transportation (Medical): No  . Lack of Transportation (Non-Medical): No  Physical Activity: Sufficiently Active  . Days of Exercise per Week: 5 days  . Minutes of Exercise per Session: 30  min  Stress: No Stress Concern Present  . Feeling of Stress : Not at all  Social Connections: Moderately Integrated  . Frequency of Communication with Friends and Family: More than three times a week  . Frequency of Social Gatherings with Friends and Family: More than three times a week  . Attends Religious Services: More than 4 times per year  . Active Member of Clubs or Organizations: Yes  . Attends Archivist Meetings: 1 to 4 times per year  . Marital Status: Divorced    Tobacco Counseling Counseling given: Not Answered Comment: 39   Clinical Intake:  Pre-visit preparation completed: Yes  Pain : No/denies pain     BMI - recorded: 45.18 Nutritional Status: BMI > 30  Obese Nutritional Risks: None Diabetes: Yes CBG done?: Yes (185) CBG resulted in Enter/ Edit results?: No Did pt. bring in CBG monitor from home?: No  How often do you need to have someone help you when you read instructions, pamphlets, or other written materials from your doctor or pharmacy?: 1 - Never  Diabetic?Nutrition Risk Assessment:  Has the patient had any N/V/D within the last 2 months?  No  Does the patient have any non-healing wounds?  No  Has the patient had any unintentional weight loss or weight gain?  No   Diabetes:  Is the patient diabetic?  Yes  If diabetic, was a CBG obtained today?  Yes  Did the patient bring in  their glucometer from home?  No  How often do you monitor your CBG's? Daily   Financial Strains and Diabetes Management:  Are you having any financial strains with the device, your supplies or your medication? No .  Does the patient want to be seen by Chronic Care Management for management of their diabetes?  No  Would the patient like to be referred to a Nutritionist or for Diabetic Management?  No   Diabetic Exams:  Diabetic Eye Exam: Completed 05/04/19  Diabetic Foot Exam: Completed 04/02/19.   Interpreter Needed?: No  Information entered by :: Charlott Rakes LPN   Activities of Daily Living In your present state of health, do you have any difficulty performing the following activities: 03/22/2020  Hearing? Y  Comment wears hearing aids  Vision? N  Difficulty concentrating or making decisions? N  Walking or climbing stairs? N  Dressing or bathing? N  Doing errands, shopping? N  Preparing Food and eating ? N  Using the Toilet? N  In the past six months, have you accidently leaked urine? Y  Comment wears a pad for accidents  Do you have problems with loss of bowel control? N  Managing your Medications? N  Managing your Finances? N  Housekeeping or managing your Housekeeping? N  Some recent data might be hidden    Patient Care Team: Panosh, Standley Brooking, MD as PCP - General Paula Compton, MD as Attending Physician (Obstetrics and Gynecology) Irene Shipper, MD as Attending Physician (Gastroenterology) Sharyne Peach, MD as Consulting Physician (Ophthalmology) Minus Breeding, MD as Consulting Physician (Cardiology) Audery Amel Sharma Covert, [facility] (Inactive) as Pharmacist (Pharmacist)  Indicate any recent Medical Services you may have received from other than [facility] providers in the past year (date may be approximate).     Assessment:   This is a routine wellness examination for [facility].  Hearing/Vision screen  Hearing Screening   _0  _1  _2  _3  _4  _5  _6   _7  _8   Right ear:  Left ear:           Comments: Pt wears hearing aids  Vision Screening Comments: Pt follows up with Dr Delman Cheadle for annual eye exams  Dietary issues and exercise activities discussed: Current Exercise Habits: Home exercise routine, Type of exercise: stretching;walking, Time (Minutes): 30, Frequency (Times/Week): 5, Weekly Exercise (Minutes/Week): 150, Exercise limited by: respiratory conditions(s)  Goals    . Chronic Care Management     CARE PLAN ENTRY  Current Barriers:  . Chronic Disease Management support, education, and care coordination needs related to Hypertension, Hyperlipidemia, and Diabetes   Hypertension BP Readings from Last 3 Encounters:  08/19/19 120/62  08/05/19 120/75  08/04/19 128/74   . Pharmacist Clinical Goal(s): o Over the next 150 days, patient will work with PharmD and providers to maintain BP goal <140/90 . Current regimen:  . Furosemide 40 mg 1 tablet daily . Metoprolol 25 mg 0.5 tablet by mouth twice daily . Interventions: o Discussed low salt diet and exercise extensively . Patient self care activities - Over the next 150 days, patient will: o Check BP once every morning, document, and provide at future appointments o Ensure daily salt intake < 2300 mg/day  Hyperlipidemia Lab Results  Component Value Date/Time   LDLCALC 74 03/10/2018 08:10 AM   LDLDIRECT 116.0 02/05/2019 11:05 AM   . Pharmacist Clinical Goal(s): o Over the next 150 days, patient will work with PharmD and providers to maintain LDL goal < 100 . Current regimen:  o Simvastatin 40 mg 1 tablet daily at bedtime . Interventions: o Discussed heart healthy diet and exercise extensively . Patient self care activities - Over the next 150 days, patient will: o Continue eating heart healthy diet and low salt intake o Continue exercising at home as tolerated  Diabetes Lab Results  Component Value Date/Time   HGBA1C 6.7 (A) 08/04/2019 10:10 AM   HGBA1C  6.7 (H) 02/05/2019 11:05 AM   HGBA1C 7.0 (H) 11/03/2018 08:56 AM   . Pharmacist Clinical Goal(s): o Over the next 150 days, patient will work with PharmD and providers to maintain A1c goal <7% . Current regimen:  o Metformin 500 mg 1 tablet by mouth twice daily with meals . Interventions: o Discussed diet and exercise extensively o Counseled on proper timing of taking metformin . Patient self care activities - Over the next 150 days, patient will: o Check blood sugar in the morning before eating or drinking, document, and provide at future appointments o Contact provider with any episodes of hypoglycemia  Medication management . Pharmacist Clinical Goal(s): o Over the next 150 days, patient will work with PharmD and providers to maintain optimal medication adherence . Current pharmacy: [facility] . Interventions o Comprehensive medication review performed. o Continue current medication management strategy . Patient self care activities - Over the next 150 days, patient will: o Focus on medication adherence by pill organizers o Take medications as prescribed o Report any questions or concerns to PharmD and/or provider(s)  Initial goal documentation      . Patient Stated     Increase exercise and lose weight       Depression Screen PHQ 2/9 Scores 03/22/2020 01/28/2019 03/10/2017 04/26/2016 04/27/2015 04/18/2014 04/14/2013  PHQ - 2 Score 0 0 0 0 0 0 0  PHQ- 9 Score - 0 - - - - -    Fall Risk Fall Risk  03/22/2020 01/28/2019 03/10/2017 04/26/2016 04/27/2015  Falls in the past year? 0 0 No Yes No  Number falls  in past yr: 0 - - 1 -  Injury with Fall? 0 - - No -  Risk for fall due to : Impaired mobility;Impaired vision - - - -  Follow up Falls prevention discussed Falls prevention discussed - Falls prevention discussed -    Any stairs in or around the home? Yes  If so, are there any without handrails? No  Home free of loose throw rugs in walkways, pet beds, electrical cords,  etc? Yes  Adequate lighting in your home to reduce risk of falls? Yes   ASSISTIVE DEVICES UTILIZED TO PREVENT FALLS:  Life alert? No  Use of a cane, walker or w/c? No  Grab bars in the bathroom? Yes  Shower chair or bench in shower? Yes  Elevated toilet seat or a handicapped toilet? Yes   TIMED UP AND GO:  Was the test performed? No .      Cognitive Function:     6CIT Screen 03/22/2020  What Year? 0 points  What month? 0 points  Count back from 20 0 points  Months in reverse 0 points  Repeat phrase 0 points    Immunizations Immunization History  Administered Date(s) Administered  . Fluad Quad(high Dose 65+) 02/11/2019, 02/29/2020  . Influenza Split 04/29/2011, 04/08/2012  . Influenza Whole 02/16/2008, 03/15/2009, 03/20/2010  . Influenza, High Dose Seasonal PF 04/14/2013, 02/14/2015, 02/28/2016, 03/10/2017, 03/25/2018  . Influenza,inj,Quad PF,6+ Mos 04/18/2014  . Influenza,inj,quad, With Preservative 02/25/2019  . Influenza-Unspecified 02/24/2017  . PFIZER SARS-COV-2 Vaccination 06/15/2019, 07/06/2019  . Pneumococcal Conjugate-13 04/08/2012  . Pneumococcal Polysaccharide-23 04/14/2013  . Td 09/14/2007  . Zoster 09/14/2007    TDAP status: Due, Education has been provided regarding the importance of this vaccine. Advised may receive this vaccine at local pharmacy or Health Dept. Aware to provide a copy of the vaccination record if obtained from local pharmacy or Health Dept. Verbalized acceptance and understanding. Flu Vaccine status: Up to date Pneumococcal vaccine status: Up to date Covid-19 vaccine status: Completed vaccines  Qualifies for Shingles Vaccine? Yes   Zostavax completed Yes   Shingrix Completed?: No.    Education has been provided regarding the importance of this vaccine. Patient has been advised to call insurance company to determine out of pocket expense if they have not yet received this vaccine. Advised may also receive vaccine at local pharmacy or  Health Dept. Verbalized acceptance and understanding.  Screening Tests Health Maintenance  Topic Date Due  . TETANUS/TDAP  09/13/2017  . HEMOGLOBIN A1C  02/04/2020  . URINE MICROALBUMIN  04/01/2020  . FOOT EXAM  04/01/2020  . OPHTHALMOLOGY EXAM  05/03/2020  . COLONOSCOPY  12/25/2021  . MAMMOGRAM  01/25/2022  . INFLUENZA VACCINE  Completed  . DEXA SCAN  Completed  . COVID-19 Vaccine  Completed  . Hepatitis C Screening  Completed  . PNA vac Low Risk Adult  Completed    Health Maintenance  Health Maintenance Due  Topic Date Due  . TETANUS/TDAP  09/13/2017  . HEMOGLOBIN A1C  02/04/2020  . URINE MICROALBUMIN  04/01/2020    Colorectal cancer screening: Completed 12/25/16. Repeat every 5 years Mammogram status: Completed 01/26/20. Repeat every year Bone Density status: Completed 06/10/12. Results reflect: Bone density results: OSTEOPENIA. Repeat every 2 years.    Additional Screening:  Hepatitis C Screening:  Completed 03/10/17  Vision Screening: Recommended annual ophthalmology exams for early detection of glaucoma and other disorders of the eye. Is the patient up to date with their annual eye exam?  Yes  Who  is the provider or what is the name of the office in which the patient attends annual eye exams? Dr Delman Cheadle   Dental Screening: Recommended annual dental exams for proper oral hygiene  Community Resource Referral / Chronic Care Management: CRR required this visit?  No   CCM required this visit?  No      Plan:     I have personally reviewed and noted the following in the patient's chart:   . Medical and social history . Use of alcohol, tobacco or illicit drugs  . Current medications and supplements . Functional ability and status . Nutritional status . Physical activity . Advanced directives . List of other physicians . Hospitalizations, surgeries, and ER visits in previous 12 months . Vitals . Screenings to include cognitive, depression, and  falls . Referrals and appointments  In addition, I have reviewed and discussed with patient certain preventive protocols, quality metrics, and best practice recommendations. A written personalized care plan for preventive services as well as general preventive health recommendations were provided to patient.     Willette Brace, LPN   [id number]   Nurse Notes: None

## 2020-03-22 NOTE — Patient Instructions (Addendum)
Rebecca Mcintyre , Thank you for taking time to come for your Medicare Wellness Visit. I appreciate your ongoing commitment to your health goals. Please review the following plan we discussed and let me know if I can assist you in the future.   Screening recommendations/referrals: Colonoscopy: Done 12/25/16 Mammogram: Done 01/26/20 Bone Density: Done 06/10/12 Recommended yearly ophthalmology/optometry visit for glaucoma screening and checkup Recommended yearly dental visit for hygiene and checkup  Vaccinations: Influenza vaccine: Done 02/29/20 Up to date Pneumococcal vaccine: Up to date done 04/08/12 & 04/14/13 Tdap vaccine: Due and discussed Shingles vaccine: Shingrix discussed. Please contact your pharmacy for coverage information.    Covid-19:Completed 1/19 & 07/06/19  Advanced directives: Please bring a copy of your health care power of attorney and living will to the office at your convenience.   Conditions/risks identified: Increase exercise and lose weight  Next appointment: Follow up in one year for your annual wellness visit    Preventive Care 65 Years and Older, Female Preventive care refers to lifestyle choices and visits with your health care provider that can promote health and wellness. What does preventive care include?  A yearly physical exam. This is also called an annual well check.  Dental exams once or twice a year.  Routine eye exams. Ask your health care provider how often you should have your eyes checked.  Personal lifestyle choices, including:  Daily care of your teeth and gums.  Regular physical activity.  Eating a healthy diet.  Avoiding tobacco and drug use.  Limiting alcohol use.  Practicing safe sex.  Taking low-dose aspirin every day.  Taking vitamin and mineral supplements as recommended by your health care provider. What happens during an annual well check? The services and screenings done by your health care provider during your annual well  check will depend on your age, overall health, lifestyle risk factors, and family history of disease. Counseling  Your health care provider may ask you questions about your:  Alcohol use.  Tobacco use.  Drug use.  Emotional well-being.  Home and relationship well-being.  Sexual activity.  Eating habits.  History of falls.  Memory and ability to understand (cognition).  Work and work Statistician.  Reproductive health. Screening  You may have the following tests or measurements:  Height, weight, and BMI.  Blood pressure.  Lipid and cholesterol levels. These may be checked every 5 years, or more frequently if you are over 54 years old.  Skin check.  Lung cancer screening. You may have this screening every year starting at age 7 if you have a 30-pack-year history of smoking and currently smoke or have quit within the past 15 years.  Fecal occult blood test (FOBT) of the stool. You may have this test every year starting at age 75.  Flexible sigmoidoscopy or colonoscopy. You may have a sigmoidoscopy every 5 years or a colonoscopy every 10 years starting at age 36.  Hepatitis C blood test.  Hepatitis B blood test.  Sexually transmitted disease (STD) testing.  Diabetes screening. This is done by checking your blood sugar (glucose) after you have not eaten for a while (fasting). You may have this done every 1-3 years.  Bone density scan. This is done to screen for osteoporosis. You may have this done starting at age 12.  Mammogram. This may be done every 1-2 years. Talk to your health care provider about how often you should have regular mammograms. Talk with your health care provider about your test results, treatment options, and  if necessary, the need for more tests. Vaccines  Your health care provider may recommend certain vaccines, such as:  Influenza vaccine. This is recommended every year.  Tetanus, diphtheria, and acellular pertussis (Tdap, Td) vaccine. You  may need a Td booster every 10 years.  Zoster vaccine. You may need this after age 60.  Pneumococcal 13-valent conjugate (PCV13) vaccine. One dose is recommended after age 26.  Pneumococcal polysaccharide (PPSV23) vaccine. One dose is recommended after age 51. Talk to your health care provider about which screenings and vaccines you need and how often you need them. This information is not intended to replace advice given to you by your health care provider. Make sure you discuss any questions you have with your health care provider. Document Released: 06/09/2015 Document Revised: 01/31/2016 Document Reviewed: 03/14/2015 Elsevier Interactive Patient Education  2017 [employer] Prevention in the Home Falls can cause injuries. They can happen to people of all ages. There are many things you can do to make your home safe and to help prevent falls. What can I do on the outside of my home?  Regularly fix the edges of walkways and driveways and fix any cracks.  Remove anything that might make you trip as you walk through a door, such as a raised step or threshold.  Trim any bushes or trees on the path to your home.  Use bright outdoor lighting.  Clear any walking paths of anything that might make someone trip, such as rocks or tools.  Regularly check to see if handrails are loose or broken. Make sure that both sides of any steps have handrails.  Any raised decks and porches should have guardrails on the edges.  Have any leaves, snow, or ice cleared regularly.  Use sand or salt on walking paths during winter.  Clean up any spills in your garage right away. This includes oil or grease spills. What can I do in the bathroom?  Use night lights.  Install grab bars by the toilet and in the tub and shower. Do not use towel bars as grab bars.  Use non-skid mats or decals in the tub or shower.  If you need to sit down in the shower, use a plastic, non-slip stool.  Keep the floor  dry. Clean up any water that spills on the floor as soon as it happens.  Remove soap buildup in the tub or shower regularly.  Attach bath mats securely with double-sided non-slip rug tape.  Do not have throw rugs and other things on the floor that can make you trip. What can I do in the bedroom?  Use night lights.  Make sure that you have a light by your bed that is easy to reach.  Do not use any sheets or blankets that are too big for your bed. They should not hang down onto the floor.  Have a firm chair that has side arms. You can use this for support while you get dressed.  Do not have throw rugs and other things on the floor that can make you trip. What can I do in the kitchen?  Clean up any spills right away.  Avoid walking on wet floors.  Keep items that you use a lot in easy-to-reach places.  If you need to reach something above you, use a strong step stool that has a grab bar.  Keep electrical cords out of the way.  Do not use floor polish or wax that makes floors slippery. If you  must use wax, use non-skid floor wax.  Do not have throw rugs and other things on the floor that can make you trip. What can I do with my stairs?  Do not leave any items on the stairs.  Make sure that there are handrails on both sides of the stairs and use them. Fix handrails that are broken or loose. Make sure that handrails are as long as the stairways.  Check any carpeting to make sure that it is firmly attached to the stairs. Fix any carpet that is loose or worn.  Avoid having throw rugs at the top or bottom of the stairs. If you do have throw rugs, attach them to the floor with carpet tape.  Make sure that you have a light switch at the top of the stairs and the bottom of the stairs. If you do not have them, ask someone to add them for you. What else can I do to help prevent falls?  Wear shoes that:  Do not have high heels.  Have rubber bottoms.  Are comfortable and fit you  well.  Are closed at the toe. Do not wear sandals.  If you use a stepladder:  Make sure that it is fully opened. Do not climb a closed stepladder.  Make sure that both sides of the stepladder are locked into place.  Ask someone to hold it for you, if possible.  Clearly mark and make sure that you can see:  Any grab bars or handrails.  First and last steps.  Where the edge of each step is.  Use tools that help you move around (mobility aids) if they are needed. These include:  Canes.  Walkers.  Scooters.  Crutches.  Turn on the lights when you go into a dark area. Replace any light bulbs as soon as they burn out.  Set up your furniture so you have a clear path. Avoid moving your furniture around.  If any of your floors are uneven, fix them.  If there are any pets around you, be aware of where they are.  Review your medicines with your doctor. Some medicines can make you feel dizzy. This can increase your chance of falling. Ask your doctor what other things that you can do to help prevent falls. This information is not intended to replace advice given to you by your health care provider. Make sure you discuss any questions you have with your health care provider. Document Released: 03/09/2009 Document Revised: 10/19/2015 Document Reviewed: 06/17/2014 Elsevier Interactive Patient Education  2017 [employer].

## 2020-03-27 ENCOUNTER — Other Ambulatory Visit: Payer: Self-pay

## 2020-03-27 ENCOUNTER — Other Ambulatory Visit (INDEPENDENT_AMBULATORY_CARE_PROVIDER_SITE_OTHER): Payer: Medicare Other

## 2020-03-27 DIAGNOSIS — E119 Type 2 diabetes mellitus without complications: Secondary | ICD-10-CM

## 2020-03-27 DIAGNOSIS — H9193 Unspecified hearing loss, bilateral: Secondary | ICD-10-CM

## 2020-03-27 DIAGNOSIS — I10 Essential (primary) hypertension: Secondary | ICD-10-CM

## 2020-03-27 DIAGNOSIS — I2510 Atherosclerotic heart disease of native coronary artery without angina pectoris: Secondary | ICD-10-CM

## 2020-03-27 DIAGNOSIS — I252 Old myocardial infarction: Secondary | ICD-10-CM

## 2020-03-27 DIAGNOSIS — Z79899 Other long term (current) drug therapy: Secondary | ICD-10-CM

## 2020-03-27 DIAGNOSIS — E785 Hyperlipidemia, unspecified: Secondary | ICD-10-CM

## 2020-03-27 DIAGNOSIS — Z8739 Personal history of other diseases of the musculoskeletal system and connective tissue: Secondary | ICD-10-CM

## 2020-03-27 LAB — LIPID PANEL
Cholesterol: 155 mg/dL (ref 0–200)
HDL: 36.5 mg/dL — ABNORMAL LOW (ref >39.00)
LDL Cholesterol: 89 mg/dL (ref 0–99)
NonHDL: 118.55
Total CHOL/HDL Ratio: 4
Triglycerides: 148 mg/dL (ref 0.0–149.0)
VLDL: 29.6 mg/dL (ref 0.0–40.0)

## 2020-03-27 LAB — CBC WITH DIFFERENTIAL/PLATELET
Basophils Absolute: 0 10*3/uL (ref 0.0–0.1)
Basophils Relative: 0.7 % (ref 0.0–3.0)
Eosinophils Absolute: 0.1 10*3/uL (ref 0.0–0.7)
Eosinophils Relative: 1.5 % (ref 0.0–5.0)
HCT: 43.9 % (ref 36.0–46.0)
Hemoglobin: 14.4 g/dL (ref 12.0–15.0)
Lymphocytes Relative: 40.6 % (ref 12.0–46.0)
Lymphs Abs: 2.5 10*3/uL (ref 0.7–4.0)
MCHC: 32.7 g/dL (ref 30.0–36.0)
MCV: 90.9 fl (ref 78.0–100.0)
Monocytes Absolute: 0.6 10*3/uL (ref 0.1–1.0)
Monocytes Relative: 9.1 % (ref 3.0–12.0)
Neutro Abs: 3 10*3/uL (ref 1.4–7.7)
Neutrophils Relative %: 48.1 % (ref 43.0–77.0)
Platelets: 239 10*3/uL (ref 150.0–400.0)
RBC: 4.83 Mil/uL (ref 3.87–5.11)
RDW: 14.6 % (ref 11.5–15.5)
WBC: 6.2 10*3/uL (ref 4.0–10.5)

## 2020-03-27 LAB — BASIC METABOLIC PANEL
BUN: 16 mg/dL (ref 6–23)
CO2: 32 mEq/L (ref 19–32)
Calcium: 9.2 mg/dL (ref 8.4–10.5)
Chloride: 100 mEq/L (ref 96–112)
Creatinine, Ser: 0.78 mg/dL (ref 0.40–1.20)
GFR: 75.32 mL/min (ref >60.00)
Glucose, Bld: 172 mg/dL — ABNORMAL HIGH (ref 70–99)
Potassium: 4.2 mEq/L (ref 3.5–5.1)
Sodium: 140 mEq/L (ref 135–145)

## 2020-03-27 LAB — HEPATIC FUNCTION PANEL
ALT: 34 U/L (ref 0–35)
AST: 46 U/L — ABNORMAL HIGH (ref 0–37)
Albumin: 3.8 g/dL (ref 3.5–5.2)
Alkaline Phosphatase: 95 U/L (ref 39–117)
Bilirubin, Direct: 0.2 mg/dL (ref 0.0–0.3)
Total Bilirubin: 0.6 mg/dL (ref 0.2–1.2)
Total Protein: 6.5 g/dL (ref 6.0–8.3)

## 2020-03-27 LAB — HEMOGLOBIN A1C: Hgb A1c MFr Bld: 10.1 % — ABNORMAL HIGH (ref 4.6–6.5)

## 2020-03-27 LAB — MICROALBUMIN / CREATININE URINE RATIO
Creatinine,U: 95.8 mg/dL
Microalb Creat Ratio: 2.9 mg/g (ref 0.0–30.0)
Microalb, Ur: 2.8 mg/dL — ABNORMAL HIGH (ref 0.0–1.9)

## 2020-03-27 LAB — TSH: TSH: 3.46 u[IU]/mL (ref 0.35–4.50)

## 2020-03-27 NOTE — Addendum Note (Signed)
Addended by: Marrion Coy on: 03/27/2020 08:06 AM   Modules accepted: Orders

## 2020-03-27 NOTE — Addendum Note (Signed)
Addended by: Adonus Uselman. M on: 03/27/2020 08:06 AM   Modules accepted: Orders  

## 2020-03-29 NOTE — Progress Notes (Signed)
A 1c too high  10 but bg 172 range   will discuss at  your upcoming visit about management

## 2020-04-03 ENCOUNTER — Encounter: Payer: Self-pay | Admitting: Internal Medicine

## 2020-04-03 ENCOUNTER — Ambulatory Visit (INDEPENDENT_AMBULATORY_CARE_PROVIDER_SITE_OTHER): Payer: Medicare Other | Admitting: Internal Medicine

## 2020-04-03 ENCOUNTER — Other Ambulatory Visit: Payer: Self-pay | Admitting: Pulmonary Disease

## 2020-04-03 ENCOUNTER — Other Ambulatory Visit: Payer: Self-pay

## 2020-04-03 VITALS — BP 136/74 | HR 76 | Temp 97.7°F | Ht 62.0 in | Wt 245.0 lb

## 2020-04-03 DIAGNOSIS — Z0000 Encounter for general adult medical examination without abnormal findings: Secondary | ICD-10-CM | POA: Diagnosis not present

## 2020-04-03 DIAGNOSIS — H9193 Unspecified hearing loss, bilateral: Secondary | ICD-10-CM

## 2020-04-03 DIAGNOSIS — Z79899 Other long term (current) drug therapy: Secondary | ICD-10-CM

## 2020-04-03 DIAGNOSIS — Q602 Renal agenesis, unspecified: Secondary | ICD-10-CM | POA: Diagnosis not present

## 2020-04-03 DIAGNOSIS — E1165 Type 2 diabetes mellitus with hyperglycemia: Secondary | ICD-10-CM | POA: Diagnosis not present

## 2020-04-03 DIAGNOSIS — J9612 Chronic respiratory failure with hypercapnia: Secondary | ICD-10-CM

## 2020-04-03 DIAGNOSIS — R635 Abnormal weight gain: Secondary | ICD-10-CM

## 2020-04-03 DIAGNOSIS — E785 Hyperlipidemia, unspecified: Secondary | ICD-10-CM

## 2020-04-03 DIAGNOSIS — I10 Essential (primary) hypertension: Secondary | ICD-10-CM | POA: Diagnosis not present

## 2020-04-03 DIAGNOSIS — J432 Centrilobular emphysema: Secondary | ICD-10-CM

## 2020-04-03 DIAGNOSIS — Q605 Renal hypoplasia, unspecified: Secondary | ICD-10-CM

## 2020-04-03 DIAGNOSIS — J9611 Chronic respiratory failure with hypoxia: Secondary | ICD-10-CM

## 2020-04-03 MED ORDER — METFORMIN HCL 500 MG PO TABS
500.0000 mg | ORAL_TABLET | Freq: Two times a day (BID) | ORAL | 1 refills | Status: DC
Start: 2020-04-03 — End: 2020-09-04

## 2020-04-03 MED ORDER — NITROGLYCERIN 0.4 MG SL SUBL
0.4000 mg | SUBLINGUAL_TABLET | SUBLINGUAL | 0 refills | Status: DC | PRN
Start: 2020-04-03 — End: 2020-04-26

## 2020-04-03 MED ORDER — SIMVASTATIN 40 MG PO TABS
40.0000 mg | ORAL_TABLET | Freq: Every day | ORAL | 3 refills | Status: DC
Start: 2020-04-03 — End: 2021-06-25

## 2020-04-03 MED ORDER — POTASSIUM CHLORIDE CRYS ER 20 MEQ PO TBCR
20.0000 meq | EXTENDED_RELEASE_TABLET | Freq: Every day | ORAL | 3 refills | Status: DC
Start: 2020-04-03 — End: 2020-06-26

## 2020-04-03 MED ORDER — DAPAGLIFLOZIN PROPANEDIOL 5 MG PO TABS: 5.0000 mg | ORAL_TABLET | Freq: Every day | ORAL | 30 refills | Status: DC

## 2020-04-03 NOTE — Progress Notes (Signed)
Chief Complaint  Patient presents with  . Annual Exam  . Diabetes  . Medication Management    HPI: Rebecca Mcintyre 74 y.o. comes in today for Preventive Medicare exam/and Chronic disease management .  Recovering from pneumonia Was on prednisone    Recently   Off and on   O2   DM bg was over 400 PP  But now back down to   165- 185    Range   Has gained weight with illness and frustrated about not getting back to activities off O2  Health Maintenance  Topic Date Due  . TETANUS/TDAP  09/13/2017  . OPHTHALMOLOGY EXAM  05/03/2020  . HEMOGLOBIN A1C  09/24/2020  . URINE MICROALBUMIN  03/27/2021  . FOOT EXAM  04/03/2021  . COLONOSCOPY  12/25/2021  . MAMMOGRAM  01/25/2022  . INFLUENZA VACCINE  Completed  . DEXA SCAN  Completed  . COVID-19 Vaccine  Completed  . Hepatitis C Screening  Completed  . PNA vac Low Risk Adult  Completed   Health Maintenance Review LIFESTYLE:  Exercise:   As tolerated  Tobacco/ETS: n Alcohol:  Sugar beverages:  n Sleep:ok Drug use: no HH:  1    Hearing: aids  Hearing loss   Vision:  No limitations at present . Last eye check UTD  Safety:  Has smoke detector and wears seat belts.  No excess sun exposure. Sees dentist regularly.  Falls: n  Memory: Felt to be good  , no concern from her or her family.  Depression: No anhedonia unusual crying or depressive symptoms  Nutrition: Eats well balanced diet; adequate calcium and vitamin D. No swallowing chewing problems.  Injury: no major injuries in the last six months.  Other healthcare providers:  Reviewed today .  Preventive parameters: up-to-date  Reviewed   ADLS:   There are no problems or need for assistance  driving, feeding, obtaining food, dressing, toileting and bathing, managing money using phone. She is independent. Has son and  Daughter to check on her    ROS:  GEN/ HEENT: No fever, significant weight changes sweats headaches vision problems hearing changes, CV/ PULM; No chest  pain syncope,edema see above  GI /GU: No adominal pain, vomiting, change in bowel habits. No blood in the stool. No significant GU symptoms. SKIN/HEME: ,no acute skin rashes suspicious lesions or bleeding. No lymphadenopathy, nodules, masses.  NEURO/ PSYCH:  No neurologic signs such as weakness numbness. No depression anxiety. IMM/ Allergy: No unusual infections.  Allergy .   REST of 12 system review negative except as per HPI   Past Medical History:  Diagnosis Date  . Allergy   . ASCUS on Pap smear    had repeat x 2 normal with [name] Plan 2011  . Coronary artery disease   . Gout   . Hyperlipidemia   . Hypertension   . Myocardial infarction [facility])    2002 95% proximal LAD stenosis with thrombus followed by 90% stenosis, the first diagonal 80% stenosis, circumflex 30% stenosis, circumflex obtuse marginal subbranch 70% stenosis, PDA 40% stenosis. She had a drug-eluting stent placement with a Pixel stent  . Osteopenia   . Solitary kidney     Family History  Problem Relation Age of Onset  . Breast cancer Mother 72  . Lung cancer Father   . Heart attack Brother        Died age 30 MI  . Colon cancer Brother   . Heart disease Daughter 39  smoker and overweight   . Esophageal cancer Neg Hx   . Pancreatic cancer Neg Hx   . Rectal cancer Neg Hx   . Stomach cancer Neg Hx     Social History   Socioeconomic History  . Marital status: Single    Spouse name: Not on file  . Number of children: 2  . Years of education: Not on file  . Highest education level: Not on file  Occupational History  . Occupation: semi retired    Fish farm manager: Clinical research associate  Tobacco Use  . Smoking status: Former Smoker    Packs/day: 1.00    Years: 39.00    Pack years: 39.00    Types: Cigarettes    Quit date: 11/04/2018    Years since quitting: 1.4  . Smokeless tobacco: Never Used  . Tobacco comment: 40  Vaping Use  . Vaping Use: Never used  Substance and Sexual Activity  . Alcohol use: Yes     Comment: 1-2 per month  . Drug use: No  . Sexual activity: Not on file  Other Topics Concern  . 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    No pets      Social Determinants of Radio broadcast assistant Strain: [place]   . Difficulty of Paying Living Expenses: Not hard at all  Food Insecurity: No Food Insecurity  . Worried About Charity fundraiser in the Last Year: Never true  . Ran Out of Food in the Last Year: Never true  Transportation Needs: No Transportation Needs  . Lack of Transportation (Medical): No  . Lack of Transportation (Non-Medical): No  Physical Activity: Sufficiently Active  . Days of Exercise per Week: 5 days  . Minutes of Exercise per Session: 30 min  Stress: No Stress Concern Present  . Feeling of Stress : Not at all  Social Connections: Moderately Integrated  . Frequency of Communication with Friends and Family: More than three times a week  . Frequency of Social Gatherings with Friends and Family: More than three times a week  . Attends Religious Services: More than 4 times per year  . Active Member of Clubs or Organizations: Yes  . Attends Archivist Meetings: 1 to 4 times per year  . Marital Status: Divorced    Outpatient Encounter Medications as of 04/03/2020  Medication Sig  . acetaminophen (TYLENOL) 500 MG tablet Take 1,000 mg by mouth every 6 (six) hours as needed for mild pain.  [name] albuterol (PROVENTIL) (2.5 MG/3ML) 0.083% nebulizer solution Take 3 mLs (2.5 mg total) by nebulization every 6 (six) hours as needed for wheezing or shortness of breath.  [name] albuterol (VENTOLIN HFA) 108 (90 Base) MCG/ACT inhaler Inhale 2 puffs into the lungs every 6 (six) hours as needed for wheezing or shortness of breath.  . allopurinol (ZYLOPRIM) 100 MG tablet TAKE 1 TABLET (100 MG TOTAL) BY MOUTH 2 (TWO) TIMES DAILY.  [name]  aspirin 81 MG tablet Take 81 mg by mouth daily.  . furosemide (LASIX) 40 MG tablet TAKE 1 TABLET BY MOUTH EVERY DAY  . metFORMIN (GLUCOPHAGE) 500 MG tablet Take 1 tablet (500 mg total) by mouth 2 (two) times daily with a meal. Increase to 2 twice a day with a meal.  . metoprolol tartrate (LOPRESSOR) 25 MG tablet TAKE 1/2  TABLETS BY MOUTH 2 TIMES DAILY  . nitroGLYCERIN (NITROSTAT) 0.4 MG SL tablet Place 1 tablet (0.4 mg total) under the tongue every 5 (five) minutes as needed.  . potassium chloride SA (KLOR-CON M20) 20 MEQ tablet Take 1 tablet (20 mEq total) by mouth daily.  [name] rOPINIRole (REQUIP) 0.5 MG tablet START 1/2 TAB AT BEDTIME X 2 NIGHTS THEN INCREASE TO 1 TAB AT BEDTIME FOR RESTLESS LEG  . simvastatin (ZOCOR) 40 MG tablet Take 1 tablet (40 mg total) by mouth at bedtime.  [name] umeclidinium-vilanterol (ANORO ELLIPTA) 62.5-25 MCG/INH AEPB TAKE 1 PUFF BY MOUTH EVERY DAY  . [DISCONTINUED] KLOR-CON M20 20 MEQ tablet TAKE 1 TABLET BY MOUTH EVERY DAY  . [DISCONTINUED] metFORMIN (GLUCOPHAGE) 500 MG tablet TAKE 1 TABLET (500 MG TOTAL) BY MOUTH 2 (TWO) TIMES DAILY WITH A MEAL.  . [DISCONTINUED] nitroGLYCERIN (NITROSTAT) 0.4 MG SL tablet Place 1 tablet (0.4 mg total) under the tongue every 5 (five) minutes as needed.  . [DISCONTINUED] simvastatin (ZOCOR) 40 MG tablet TAKE 1 TABLET BY MOUTH AT BEDTIME  . dapagliflozin propanediol (FARXIGA) 5 MG TABS tablet Take 1 tablet (5 mg total) by mouth daily before breakfast.  . montelukast (SINGULAIR) 10 MG tablet TAKE 1 TABLET (10 MG TOTAL) AT BEDTIME BY MOUTH. (Patient not taking: Reported on 04/03/2020)   No facility-administered encounter medications on file as of 04/03/2020.    EXAM:  BP 136/74   Pulse 76   Temp 97.7 F (36.5 C) (Oral)   Ht _0  (1.575 m)   Wt 245 lb (111.1 kg)   SpO2 93%   BMI 44.81 kg/m   Body mass index is 44.81 kg/m. Wt Readings from Last 3 Encounters:  04/03/20 245 lb (111.1 kg)  02/29/20 247 lb (112 kg)  02/10/20 245 lb  9.6 oz (111.4 kg)    Physical Exam: Vital signs reviewed ZWC:HENI is a well-developed well-nourished alert cooperative   who appears stated age in no acute distress.  On O2  HEENT: normocephalic atraumatic , Eyes: PERRL EOM's full, conjunctiva clear, Nares: paten,t no deformity discharge or tenderness., Ears: no deformity EAC's clear TMs poss clear fluid right  No redness  Has aids . Mouth:masked and o2 canula  NECK: supple without masses, thyromegaly or bruits. CHEST/PULM:  Clear to auscultation and percussion breath sounds equal no wheeze , rales or rhonchi. No chest wall deformities or tenderness. Dec bs throughout  CV: PMI is nondisplaced, S1 S2 no gallops, murmurs, rubs. Peripheral pulses are full without delay.No JVD .  ABDOMEN: Bowel sounds normal nontender  No guard or rebound, no hepato splenomegal no CVA tenderness.   Extremtities:  No clubbing cyanosis or edema, no acute joint swelling or redness no focal atrophy NEURO:  Oriented x3, cranial nerves 3-12 appear to be intact, no obvious focal weakness,gait within normal limits no abnormal reflexes or asymmetrical SKIN: No acute rashes normal turgor, color, no bruising or petechiae. PSYCH: Oriented, good eye contact, no obvious depression anxiety, cognition and judgment appear normal. LN: no cervical axillary inguinal adenopathy No noted deficits in memory, attention, and speech. Feet pulses in tact  Nl sensation   Cool digits no lesion or ulcer  Dryness noted    Lab Results  Component Value Date   WBC 6.2 03/27/2020   HGB 14.4 03/27/2020   HCT 43.9 03/27/2020   PLT 239.0 03/27/2020   GLUCOSE 172 (H) 03/27/2020   CHOL 155 03/27/2020   TRIG 148.0 03/27/2020   HDL 36.50 (L) 03/27/2020  LDLDIRECT 116.0 02/05/2019   LDLCALC 89 03/27/2020   ALT 34 03/27/2020   AST 46 (H) 03/27/2020   NA 140 03/27/2020   K 4.2 03/27/2020   CL 100 03/27/2020   CREATININE 0.78 03/27/2020   BUN 16 03/27/2020   CO2 32 03/27/2020   TSH 3.46  03/27/2020   HGBA1C 10.1 (H) 03/27/2020   MICROALBUR 2.8 (H) 03/27/2020    ASSESSMENT AND PLAN:  Discussed the following assessment and plan:  Visit for preventive health examination  Medication management  Type 2 diabetes mellitus with hyperglycemia, without long-term current use of insulin (HCC)  Decreased hearing of both ears  Essential hypertension  SOLITARY KIDNEY  Centrilobular emphysema (HCC)  Weight gain  Hyperlipidemia, unspecified hyperlipidemia type  Chronic respiratory failure with hypoxia and hypercapnia (HCC)  Most limiting factor for her at this time is her respiratory status.  Underlying COPD emphysema with recent history of pneumonia currently still on home O2.  She is hopeful to get off of this at sometime in improvement. Her diabetes has been temporarily way out of control she attributes some to weight gain recently.and some courses of steroids    And hopes to get it down she states now that her blood sugars are below 200 when checks and is working on it she is taking Metformin 500 twice daily and willing to increase dosing and add other medications.  Prefers not to be on insulin if possible. Reviewed at length to increase metformin to max dose equivalent of 2000 mg a day over time and if not controlled she can add Farxiga 5 mg prescription printed and given to patient discussed risk benefit of this medicine.( we can use jardiance  if formulary driven)  Plan  OV in about 3 months or as needed Follow-up A1c at that time.  Patient Care Team: Haik Mahoney, Standley Brooking, MD as PCP - General Paula Compton, MD as Attending Physician (Obstetrics and Gynecology) Irene Shipper, MD as Attending Physician (Gastroenterology) Sharyne Peach, MD as Consulting Physician (Ophthalmology) Minus Breeding, MD as Consulting Physician (Cardiology) Eli Hose, [facility] (Inactive) as Pharmacist (Pharmacist) Rigoberto Noel, MD as Consulting Physician (Pulmonary Disease)  Patient  Instructions  Increase metformin to 3 per day 2 500 am and 1 500 pm   After a   Few weeks increase to 2 500 in am and 2 500 in pm.   If bg goal is not met  ( fasting am bg below  120  And after eating below 180 190 )  After  2-4 weeks then   Then we can add  sglt2 medication . Printed  Once a day in addition to metformin .  Yes get the covid booster and shin grix .   Healthy weight loss .    Health Maintenance, Female Adopting a healthy lifestyle and getting preventive care are important in promoting health and wellness. Ask your health care provider about:  The right schedule for you to have regular tests and exams.  Things you can do on your own to prevent diseases and keep yourself healthy. What should I know about diet, weight, and exercise? Eat a healthy diet   Eat a diet that includes plenty of vegetables, fruits, low-fat dairy products, and lean protein.  Do not eat a lot of foods that are high in solid fats, added sugars, or sodium. Maintain a healthy weight Body mass index (BMI) is used to identify weight problems. It estimates body fat based on height and weight. Your health  care provider can help determine your BMI and help you achieve or maintain a healthy weight. Get regular exercise Get regular exercise. This is one of the most important things you can do for your health. Most adults should:  Exercise for at least 150 minutes each week. The exercise should increase your heart rate and make you sweat (moderate-intensity exercise).  Do strengthening exercises at least twice a week. This is in addition to the moderate-intensity exercise.  Spend less time sitting. Even light physical activity can be beneficial. Watch cholesterol and blood lipids Have your blood tested for lipids and cholesterol at 74 years of age, then have this test every 5 years. Have your cholesterol levels checked more often if:  Your lipid or cholesterol levels are high.  You are older than 73  years of age.  You are at high risk for heart disease. What should I know about cancer screening? Depending on your health history and family history, you may need to have cancer screening at various ages. This may include screening for:  Breast cancer.  Cervical cancer.  Colorectal cancer.  Skin cancer.  Lung cancer. What should I know about heart disease, diabetes, and high blood pressure? Blood pressure and heart disease  High blood pressure causes heart disease and increases the risk of stroke. This is more likely to develop in people who have high blood pressure readings, are of African descent, or are overweight.  Have your blood pressure checked: ? Every 3-5 years if you are 74-38 years of age. ? Every year if you are 42 years old or older. Diabetes Have regular diabetes screenings. This checks your fasting blood sugar level. Have the screening done:  Once every three years after age 3 if you are at a normal weight and have a low risk for diabetes.  More often and at a younger age if you are overweight or have a high risk for diabetes. What should I know about preventing infection? Hepatitis B If you have a higher risk for hepatitis B, you should be screened for this virus. Talk with your health care provider to find out if you are at risk for hepatitis B infection. Hepatitis C Testing is recommended for:  Everyone born from [birth year] through 1964.  Anyone with known risk factors for hepatitis C. Sexually transmitted infections (STIs)  Get screened for STIs, including gonorrhea and chlamydia, if: ? You are sexually active and are younger than 73 years of age. ? You are older than 73 years of age and your health care provider tells you that you are at risk for this type of infection. ? Your sexual activity has changed since you were last screened, and you are at increased risk for chlamydia or gonorrhea. Ask your health care provider if you are at risk.  Ask your health  care provider about whether you are at high risk for HIV. Your health care provider may recommend a prescription medicine to help prevent HIV infection. If you choose to take medicine to prevent HIV, you should first get tested for HIV. You should then be tested every 3 months for as long as you are taking the medicine. Pregnancy  If you are about to stop having your period (premenopausal) and you may become pregnant, seek counseling before you get pregnant.  Take 400 to 800 micrograms (mcg) of folic acid every day if you become pregnant.  Ask for birth control (contraception) if you want to prevent pregnancy. Osteoporosis and menopause Osteoporosis is a  disease in which the bones lose minerals and strength with aging. This can result in bone fractures. If you are 4 years old or older, or if you are at risk for osteoporosis and fractures, ask your health care provider if you should:  Be screened for bone loss.  Take a calcium or vitamin D supplement to lower your risk of fractures.  Be given hormone replacement therapy (HRT) to treat symptoms of menopause. Follow these instructions at home: Lifestyle  Do not use any products that contain nicotine or tobacco, such as cigarettes, e-cigarettes, and chewing tobacco. If you need help quitting, ask your health care provider.  Do not use street drugs.  Do not share needles.  Ask your health care provider for help if you need support or information about quitting drugs. Alcohol use  Do not drink alcohol if: ? Your health care provider tells you not to drink. ? You are pregnant, may be pregnant, or are planning to become pregnant.  If you drink alcohol: ? Limit how much you use to 0-1 drink a day. ? Limit intake if you are breastfeeding.  Be aware of how much alcohol is in your drink. In the [place] one drink equals one 12 oz bottle of beer (355 mL), one 5 oz glass of wine (148 mL), or one 1 oz glass of hard liquor (44 mL). General  instructions  Schedule regular health, dental, and eye exams.  Stay current with your vaccines.  Tell your health care provider if: ? You often feel depressed. ? You have ever been abused or do not feel safe at home. Summary  Adopting a healthy lifestyle and getting preventive care are important in promoting health and wellness.  Follow your health care provider's instructions about healthy diet, exercising, and getting tested or screened for diseases.  Follow your health care provider's instructions on monitoring your cholesterol and blood pressure. This information is not intended to replace advice given to you by your health care provider. Make sure you discuss any questions you have with your health care provider. Document Revised: 05/06/2018 Document Reviewed: 05/06/2018 Elsevier Patient Education  2020 [place] Emili Mcloughlin M.D.

## 2020-04-03 NOTE — Patient Instructions (Signed)
Increase metformin to 3 per day 2 500 am and 1 500 pm   After a   Few weeks increase to 2 500 in am and 2 500 in pm.   If bg goal is not met  ( fasting am bg below  120  And after eating below 180 190 )  After  2-4 weeks then   Then we can add  sglt2 medication . Printed  Once a day in addition to metformin .  Yes get the covid booster and shin grix .   Healthy weight loss .    Health Maintenance, Female Adopting a healthy lifestyle and getting preventive care are important in promoting health and wellness. Ask your health care provider about:  The right schedule for you to have regular tests and exams.  Things you can do on your own to prevent diseases and keep yourself healthy. What should I know about diet, weight, and exercise? Eat a healthy diet   Eat a diet that includes plenty of vegetables, fruits, low-fat dairy products, and lean protein.  Do not eat a lot of foods that are high in solid fats, added sugars, or sodium. Maintain a healthy weight Body mass index (BMI) is used to identify weight problems. It estimates body fat based on height and weight. Your health care provider can help determine your BMI and help you achieve or maintain a healthy weight. Get regular exercise Get regular exercise. This is one of the most important things you can do for your health. Most adults should:  Exercise for at least 150 minutes each week. The exercise should increase your heart rate and make you sweat (moderate-intensity exercise).  Do strengthening exercises at least twice a week. This is in addition to the moderate-intensity exercise.  Spend less time sitting. Even light physical activity can be beneficial. Watch cholesterol and blood lipids Have your blood tested for lipids and cholesterol at 73 years of age, then have this test every 5 years. Have your cholesterol levels checked more often if:  Your lipid or cholesterol levels are high.  You are older than 73 years of  age.  You are at high risk for heart disease. What should I know about cancer screening? Depending on your health history and family history, you may need to have cancer screening at various ages. This may include screening for:  Breast cancer.  Cervical cancer.  Colorectal cancer.  Skin cancer.  Lung cancer. What should I know about heart disease, diabetes, and high blood pressure? Blood pressure and heart disease  High blood pressure causes heart disease and increases the risk of stroke. This is more likely to develop in people who have high blood pressure readings, are of African descent, or are overweight.  Have your blood pressure checked: ? Every 3-5 years if you are 11-21 years of age. ? Every year if you are 72 years old or older. Diabetes Have regular diabetes screenings. This checks your fasting blood sugar level. Have the screening done:  Once every three years after age 20 if you are at a normal weight and have a low risk for diabetes.  More often and at a younger age if you are overweight or have a high risk for diabetes. What should I know about preventing infection? Hepatitis B If you have a higher risk for hepatitis B, you should be screened for this virus. Talk with your health care provider to find out if you are at risk for hepatitis B infection. Hepatitis  C Testing is recommended for:  Everyone born from [birth year] through 1964.  Anyone with known risk factors for hepatitis C. Sexually transmitted infections (STIs)  Get screened for STIs, including gonorrhea and chlamydia, if: ? You are sexually active and are younger than 73 years of age. ? You are older than 73 years of age and your health care provider tells you that you are at risk for this type of infection. ? Your sexual activity has changed since you were last screened, and you are at increased risk for chlamydia or gonorrhea. Ask your health care provider if you are at risk.  Ask your health care  provider about whether you are at high risk for HIV. Your health care provider may recommend a prescription medicine to help prevent HIV infection. If you choose to take medicine to prevent HIV, you should first get tested for HIV. You should then be tested every 3 months for as long as you are taking the medicine. Pregnancy  If you are about to stop having your period (premenopausal) and you may become pregnant, seek counseling before you get pregnant.  Take 400 to 800 micrograms (mcg) of folic acid every day if you become pregnant.  Ask for birth control (contraception) if you want to prevent pregnancy. Osteoporosis and menopause Osteoporosis is a disease in which the bones lose minerals and strength with aging. This can result in bone fractures. If you are 64 years old or older, or if you are at risk for osteoporosis and fractures, ask your health care provider if you should:  Be screened for bone loss.  Take a calcium or vitamin D supplement to lower your risk of fractures.  Be given hormone replacement therapy (HRT) to treat symptoms of menopause. Follow these instructions at home: Lifestyle  Do not use any products that contain nicotine or tobacco, such as cigarettes, e-cigarettes, and chewing tobacco. If you need help quitting, ask your health care provider.  Do not use street drugs.  Do not share needles.  Ask your health care provider for help if you need support or information about quitting drugs. Alcohol use  Do not drink alcohol if: ? Your health care provider tells you not to drink. ? You are pregnant, may be pregnant, or are planning to become pregnant.  If you drink alcohol: ? Limit how much you use to 0-1 drink a day. ? Limit intake if you are breastfeeding.  Be aware of how much alcohol is in your drink. In the [place] one drink equals one 12 oz bottle of beer (355 mL), one 5 oz glass of wine (148 mL), or one 1 oz glass of hard liquor (44 mL). General  instructions  Schedule regular health, dental, and eye exams.  Stay current with your vaccines.  Tell your health care provider if: ? You often feel depressed. ? You have ever been abused or do not feel safe at home. Summary  Adopting a healthy lifestyle and getting preventive care are important in promoting health and wellness.  Follow your health care provider's instructions about healthy diet, exercising, and getting tested or screened for diseases.  Follow your health care provider's instructions on monitoring your cholesterol and blood pressure. This information is not intended to replace advice given to you by your health care provider. Make sure you discuss any questions you have with your health care provider. Document Revised: 05/06/2018 Document Reviewed: 05/06/2018 Elsevier Patient Education  2020 [employer].

## 2020-04-07 ENCOUNTER — Telehealth: Payer: Medicare Other

## 2020-04-07 ENCOUNTER — Telehealth: Payer: Self-pay | Admitting: Pharmacist

## 2020-04-13 DIAGNOSIS — J449 Chronic obstructive pulmonary disease, unspecified: Secondary | ICD-10-CM | POA: Diagnosis not present

## 2020-04-13 DIAGNOSIS — I5033 Acute on chronic diastolic (congestive) heart failure: Secondary | ICD-10-CM | POA: Diagnosis not present

## 2020-04-17 NOTE — Telephone Encounter (Signed)
Tammy, NP recommendations sent through mychart. I also called and spoke with Patient.  Tammy,NP recommendations given.  Understanding stated.  Nothing further at this time.

## 2020-04-17 NOTE — Telephone Encounter (Signed)
Tried to call patient , no answer. LMOMTCB   Sorry to hear she is not feeling well  Could be residual from Covid vaccine should pass in next couple of days.   Would rest , take tylenol As needed   Add mucinex DM Twice daily  As needed  Cough/congestion  Add saline nasal rinses As needed   Add claritin 64m daily As needed  Drainage.   If not improving will need televisit /ov to further evaluate.   Please contact office for sooner follow up if symptoms do not improve or worsen or seek emergency care

## 2020-04-17 NOTE — Telephone Encounter (Signed)
Patient sent 2 messages through my chart. I called Patient to see how she was feeling this morning.  Patient stated she had her covid booster Friday 04/14/20. Patient stated she started feeling a little under the weather Friday night. Patient stated Saturday she was feeling fatigued, runny nose, and at times nasal congestion, which made her feel like she was having problems getting enough air.  Patient stated she has been using her oxygen. Patient stated she started having a productive cough yesterday, with clear phlegm, and head congestion. Patient stated she has been keeping track of her temp , since symptoms began, but has not had any fever. Patient stated her body doesn't ache, but she doesn't feel her normal self. Patient request any prescriptions to go to [facility].  Message routed to Tammy,NP to advise  04/16/20 Today I've started coughing and the phlem is clear but the cough sounds harsh, nose is still running, no fever, but feeling slightly tired. My Oxygen level drops quick if I don't have it on constantly.  Trying to stay on top since holiday is happening and Mom/Grandma is expected to be feeling well! Any sugguestions? I can be reached at [phone] 04/15/20  woke up with a runny nose and a thick feeling throat. No fever and I just feel tired.  Just trying to stay on top of it as I have company coming for the upcoming holiday! I took some cold medicine. I'm still on oxygen most of the day since my pneumonia and taking lasix daily. Thanks [employer]

## 2020-04-24 ENCOUNTER — Telehealth: Payer: Self-pay | Admitting: Adult Health

## 2020-04-24 NOTE — Telephone Encounter (Signed)
Called and spoke with pt and she stated that she had her covid booster before thanksgiving and she has not felt good since.  She called and was told to use OTC meds to help with the symptoms and that this was side effects of the booster.  Pt stated that she does feel a little better today.  She is going to wait until tomorrow to see if she improves more with the OTC meds.  Nothing further is needed.

## 2020-04-26 ENCOUNTER — Other Ambulatory Visit: Payer: Self-pay | Admitting: Internal Medicine

## 2020-04-28 ENCOUNTER — Other Ambulatory Visit: Payer: Self-pay | Admitting: Internal Medicine

## 2020-05-01 NOTE — Progress Notes (Signed)
A user error has taken place: encounter opened in error, closed for administrative reasons.

## 2020-05-01 NOTE — Telephone Encounter (Signed)
Dr. [name] please respond to follow My Chart message:  This message is being sent by Rebecca Mcintyre on behalf of Rebecca Mcintyre.  I started feeling bad after my booster. I reached out to you and said it was most likely a side effect.  I went to my copd research appt on December 1, still feeling bad, (low energy, cough, fatigue) i was able to see a nurse practitioner who checked my lungs and said I had fluid in them. They prescribed me  Levofloxacin 721m for 7 days Prednisone 137m4/3, 3/3, 2/3, 1/3 I started the antibiotic on 12/1 and the prednisone on 12/2. I'm also using liquid musinex. Now I need something called in for a yeast infection.    I would also like to request a follow up appt with Dr. AlElsworth Sohonce I've completed this round of medications to confirm I've recovered from this. I'm not sure if I ever completely recovered from the pneumonia I was treated for previously.  Thanks,  Rebecca Mcintyre sending for Rebecca Mcintyre Thank you

## 2020-05-02 MED ORDER — FLUCONAZOLE 100 MG PO TABS: 100.0000 mg | ORAL_TABLET | Freq: Every day | ORAL | 0 refills | Status: DC

## 2020-05-02 NOTE — Telephone Encounter (Signed)
Diflucan 100 mg daily for 3 days. Please schedule office visit with APP/me -next available per patient request

## 2020-05-02 NOTE — Telephone Encounter (Signed)
Dr. [name], please see multiple mychart messages sent and advise. Thank you!

## 2020-05-02 NOTE — Telephone Encounter (Signed)
Called and spoke with pt letting her know that we were going to send Rx for diflucan to pharmacy for her. Also have scheduled her an appt with TP. Nothing further needed.

## 2020-05-02 NOTE — Addendum Note (Signed)
Addended by: Lorretta Harp on: 05/02/2020 03:49 PM   Modules accepted: Orders

## 2020-05-04 ENCOUNTER — Other Ambulatory Visit: Payer: Self-pay | Admitting: Internal Medicine

## 2020-05-04 DIAGNOSIS — Z20822 Contact with and (suspected) exposure to covid-19: Secondary | ICD-10-CM | POA: Diagnosis not present

## 2020-05-04 NOTE — Telephone Encounter (Signed)
Last OV 04/03/20 Last refill 02/04/20 #180/0 Next OV 03/28/21 [facility] Advisor)

## 2020-05-08 DIAGNOSIS — H52202 Unspecified astigmatism, left eye: Secondary | ICD-10-CM | POA: Diagnosis not present

## 2020-05-08 DIAGNOSIS — H40023 Open angle with borderline findings, high risk, bilateral: Secondary | ICD-10-CM | POA: Diagnosis not present

## 2020-05-08 DIAGNOSIS — E119 Type 2 diabetes mellitus without complications: Secondary | ICD-10-CM | POA: Diagnosis not present

## 2020-05-08 LAB — HM DIABETES EYE EXAM

## 2020-05-09 ENCOUNTER — Encounter: Payer: Self-pay | Admitting: Adult Health

## 2020-05-09 ENCOUNTER — Ambulatory Visit (INDEPENDENT_AMBULATORY_CARE_PROVIDER_SITE_OTHER): Payer: Medicare Other

## 2020-05-09 ENCOUNTER — Ambulatory Visit: Payer: Medicare Other | Admitting: Adult Health

## 2020-05-09 ENCOUNTER — Other Ambulatory Visit: Payer: Self-pay

## 2020-05-09 VITALS — BP 110/80 | HR 97 | Temp 97.2°F | Ht 63.0 in | Wt 242.0 lb

## 2020-05-09 DIAGNOSIS — I5022 Chronic systolic (congestive) heart failure: Secondary | ICD-10-CM | POA: Diagnosis not present

## 2020-05-09 DIAGNOSIS — J439 Emphysema, unspecified: Secondary | ICD-10-CM | POA: Diagnosis not present

## 2020-05-09 DIAGNOSIS — J441 Chronic obstructive pulmonary disease with (acute) exacerbation: Secondary | ICD-10-CM

## 2020-05-09 DIAGNOSIS — J9612 Chronic respiratory failure with hypercapnia: Secondary | ICD-10-CM | POA: Diagnosis not present

## 2020-05-09 DIAGNOSIS — J9611 Chronic respiratory failure with hypoxia: Secondary | ICD-10-CM | POA: Diagnosis not present

## 2020-05-09 DIAGNOSIS — J189 Pneumonia, unspecified organism: Secondary | ICD-10-CM

## 2020-05-09 NOTE — Patient Instructions (Addendum)
Chest xray today .  Continue on Oxygen 2 at rest /bedtime and 3 l/m with activity  To keep sats>88-90%.  Begin TRELEGY 1 puff daily  Stop ANORO daily  Albuterol As needed   Albuterol Neb , may use as needed only -every 4-6hrs  Continue on Lasix 77m daily .  Low salt diet.  Follow up in 2 months with Dr. AElsworth Soho and As needed   Please contact office for sooner follow up if symptoms do not improve or worsen or seek emergency care

## 2020-05-09 NOTE — Progress Notes (Signed)
_0  ID: Rebecca Mcintyre, female    DOB: Sep 17, 1945, 74 y.o.   MRN: [id number]  Chief Complaint  Patient presents with  . Follow-up    Referring provider: Burnis Medin, MD  HPI: 74 year old female former smoker followed for COPD and chronic hypoxic respiratory failure on oxygen Medical history significant for diabetes. Patient is a twin.  She only has 1 kidney.  Left only. Participates in research in [place]-COPD study  TEST/EVENTS :  PFTs8/08/2018 FVC 2.31 (82), FEV1 1.20 (56), ratio 52, no bronchodilatory response,DLCO 51 %  Imaging 6/10/20CTAChest-No PE; cardiomegaly andemphysematous changes of the lung  2D echo November 04, 2018 EF 6065%, RV has moderately reduced systolic function, moderately enlarged and severely elevated pressure at 70.3 mmHg.  Right atrium is moderately dilated.  And tricuspid valve with mild to moderate regurg  2D echo January 19, 2019 showed EF normal, grade 2 diastolic dysfunction, normal RV size and function compared to echo November 04, 2018 showed significant decrease in RV size and improvement in RV function.  Right atrial pressure estimated at 3 mmHg and right atrial size is normal  ABG November 05, 2018 pH 7.278/PCO2 85, PO2 175.  NPSG7/19/20 no obstructive sleep apnea, min O2 89%. Periodic limb movements did occur during sleep  Admitted 10/2019 with acute cor pulmonale, improved with diuresis , CTa neg for PE . Follow up echo showed improvement   05/09/2020 COPD and O2 RF  Patient returns for follow-up for COPD exacerbations ongoing symptoms.  covid booser.  Patient says about 3 weeks ago she started having increased cough congestion thick mucus.  She was started on Levaquin and prednisone which she finished yesterday.  She says she is starting to feel better.  Cough is decreasing and the mucus is mainly clear with a minimal amount of yellow.  Last visit in October patient was restarted on Lasix and says that her lower extremity  swelling has been much better and weight is down 11 pounds.  She remains on Anoro daily.  She is on oxygen 2 L at rest and 3 L with activity.  We talked about the importance of wearing her oxygen and being compliant.  And to keep O2 saturations greater than 88 to 90%.  Patient was set up last visit for an ABG which did not show significant hypercarbia. Patient denies any fever.  Covid vaccines x3 are up-to-date.   Allergies  Allergen Reactions  . Atorvastatin     REACTION: myalgias  and liver    Immunization History  Administered Date(s) Administered  . Fluad Quad(high Dose 65+) 02/11/2019, 02/29/2020  . Influenza Split 04/29/2011, 04/08/2012  . Influenza Whole 02/16/2008, 03/15/2009, 03/20/2010  . Influenza, High Dose Seasonal PF 04/14/2013, 02/14/2015, 02/28/2016, 03/10/2017, 03/25/2018  . Influenza,inj,Quad PF,6+ Mos 04/18/2014  . Influenza,inj,quad, With Preservative 02/25/2019  . Influenza-Unspecified 02/24/2017  . PFIZER SARS-COV-2 Vaccination 06/15/2019, 07/06/2019, 04/13/2020  . Pneumococcal Conjugate-13 04/08/2012  . Pneumococcal Polysaccharide-23 04/14/2013  . Td 09/14/2007  . Zoster 09/14/2007    Past Medical History:  Diagnosis Date  . Allergy   . ASCUS on Pap smear    had repeat x 2 normal with [name] Plan 2011  . Coronary artery disease   . Gout   . Hyperlipidemia   . Hypertension   . Myocardial infarction [facility])    2002 95% proximal LAD stenosis with thrombus followed by 90% stenosis, the first diagonal 80% stenosis, circumflex 30% stenosis, circumflex obtuse marginal subbranch 70% stenosis, PDA 40% stenosis. She had a drug-eluting  stent placement with a Pixel stent  . Osteopenia   . Solitary kidney     Tobacco History: Social History   Tobacco Use  Smoking Status Former Smoker  . Packs/day: 1.00  . Years: 39.00  . Pack years: 39.00  . Types: Cigarettes  . Quit date: 11/04/2018  . Years since quitting: 1.5  Smokeless Tobacco Never Used  Tobacco  Comment   39   Counseling given: Not Answered Comment: 39   Outpatient Medications Prior to Visit  Medication Sig Dispense Refill  . acetaminophen (TYLENOL) 500 MG tablet Take 1,000 mg by mouth every 6 (six) hours as needed for mild pain.    [name] albuterol (PROVENTIL) (2.5 MG/3ML) 0.083% nebulizer solution Take 3 mLs (2.5 mg total) by nebulization every 6 (six) hours as needed for wheezing or shortness of breath. 300 mL 11  . allopurinol (ZYLOPRIM) 100 MG tablet TAKE 1 TABLET (100 MG TOTAL) BY MOUTH 2 (TWO) TIMES DAILY. 180 tablet 0  . aspirin 81 MG tablet Take 81 mg by mouth daily.    . dapagliflozin propanediol (FARXIGA) 5 MG TABS tablet Take 1 tablet (5 mg total) by mouth daily before breakfast. 30 tablet 30  . furosemide (LASIX) 40 MG tablet TAKE 1 TABLET BY MOUTH EVERY DAY 90 tablet 0  . metFORMIN (GLUCOPHAGE) 500 MG tablet Take 1 tablet (500 mg total) by mouth 2 (two) times daily with a meal. Increase to 2 twice a day with a meal. 360 tablet 1  . metoprolol tartrate (LOPRESSOR) 25 MG tablet TAKE 1/2 TABLETS BY MOUTH 2 TIMES DAILY 90 tablet 0  . montelukast (SINGULAIR) 10 MG tablet TAKE 1 TABLET (10 MG TOTAL) AT BEDTIME BY MOUTH. 90 tablet 0  . nitroGLYCERIN (NITROSTAT) 0.4 MG SL tablet PLACE 1 TABLET UNDER THE TONGUE EVERY 5 MINUTES AS NEEDED. 25 tablet 1  . potassium chloride SA (KLOR-CON M20) 20 MEQ tablet Take 1 tablet (20 mEq total) by mouth daily. 30 tablet 3  . PROAIR HFA 108 (90 Base) MCG/ACT inhaler TAKE 2 PUFFS BY MOUTH EVERY 6 HOURS AS NEEDED FOR WHEEZE OR SHORTNESS OF BREATH 8.5 each 3  . rOPINIRole (REQUIP) 0.5 MG tablet START 1/2 TAB AT BEDTIME X 2 NIGHTS THEN INCREASE TO 1 TAB AT BEDTIME FOR RESTLESS LEG 90 tablet 2  . simvastatin (ZOCOR) 40 MG tablet Take 1 tablet (40 mg total) by mouth at bedtime. 90 tablet 3  . umeclidinium-vilanterol (ANORO ELLIPTA) 62.5-25 MCG/INH AEPB TAKE 1 PUFF BY MOUTH EVERY DAY 60 each 5  . fluconazole (DIFLUCAN) 100 MG tablet Take 1 tablet (100 mg  total) by mouth daily. (Patient not taking: Reported on 05/09/2020) 3 tablet 0   No facility-administered medications prior to visit.     Review of Systems:   Constitutional:   No  weight loss, night sweats,  Fevers, chills,  +fatigue, or  lassitude.  HEENT:   No headaches,  Difficulty swallowing,  Tooth/dental problems, or  Sore throat,                No sneezing, itching, ear ache, nasal congestion, post nasal drip,   CV:  No chest pain,  Orthopnea, PND, swelling in lower extremities, anasarca, dizziness, palpitations, syncope.   GI  No heartburn, indigestion, abdominal pain, nausea, vomiting, diarrhea, change in bowel habits, loss of appetite, bloody stools.   Resp:    No chest wall deformity  Skin: no rash or lesions.  GU: no dysuria, change in color of urine, no urgency  or frequency.  No flank pain, no hematuria   MS:  No joint pain or swelling.  No decreased range of motion.  No back pain.    Physical Exam  BP 110/80 (BP Location: Left Arm, Patient Position: Sitting, Cuff Size: Large)   Pulse 97   Temp (!) 97.2 F (36.2 C) (Temporal)   Ht _0  (1.6 m)   Wt 242 lb (109.8 kg)   SpO2 (!) 89% Comment: 85% after walking from lobby, after resting up to 89-90%  BMI 42.87 kg/m   GEN: A/Ox3; pleasant , NAD, well nourished    HEENT:  [place]/AT  NOSE-clear, THROAT-clear, no lesions, no postnasal drip or exudate noted.   NECK:  Supple w/ fair ROM; no JVD; normal carotid impulses w/o bruits; no thyromegaly or nodules palpated; no lymphadenopathy.    RESP decreased breath sounds in the bases otherwise clear   no accessory muscle use, no dullness to percussion  CARD:  RRR, no m/r/g, no peripheral edema, pulses intact, no cyanosis or clubbing.  GI:   Soft & nt; nml bowel sounds; no organomegaly or masses detected.   Musco: Warm bil, no deformities or joint swelling noted.   Neuro: alert, no focal deficits noted.    Skin: Warm, no lesions or rashes    Lab  Results:   BMET  Imaging: No results found.    PFT Results Latest Ref Rng & Units 12/29/2018  FVC-Pre L 2.28  FVC-Predicted Pre % 81  FVC-Post L 2.31  FVC-Predicted Post % 82  Pre FEV1/FVC % % 51  Post FEV1/FCV % % 52  FEV1-Pre L 1.17  FEV1-Predicted Pre % 55  FEV1-Post L 1.20  DLCO uncorrected ml/min/mmHg 9.62  DLCO UNC% % 51  DLVA Predicted % 67  TLC L 5.11  TLC % Predicted % 104  RV % Predicted % 128    No results found for: NITRICOXIDE      Assessment & Plan:   No problem-specific Assessment & Plan notes found for this encounter.     Rexene Edison, NP 05/09/2020

## 2020-05-10 MED ORDER — TRELEGY ELLIPTA 200-62.5-25 MCG/INH IN AEPB: 1.0000 | INHALATION_SPRAY | Freq: Every day | RESPIRATORY_TRACT | 0 refills | Status: DC

## 2020-05-10 NOTE — Telephone Encounter (Signed)
Merton Border, The patient's daughter is wanting some clarification on what is meant by emphysematous changes from her chest x-ray.  Please advise.  Thank you.

## 2020-05-10 NOTE — Addendum Note (Signed)
Addended by: Vanessa Barbara on: 05/10/2020 01:52 PM   Modules accepted: Orders

## 2020-05-10 NOTE — Assessment & Plan Note (Signed)
Appears compensated.  Continue on current regimen.

## 2020-05-10 NOTE — Assessment & Plan Note (Signed)
Slow to resolve COPD exacerbation.  Patient is prone to frequent flares.  Will change Anoro to Trelegy. Check chest x-ray today.  Plan  Patient Instructions  Chest xray today .  Continue on Oxygen 2 at rest /bedtime and 3 l/m with activity  To keep sats>88-90%.  Begin TRELEGY 1 puff daily  Stop ANORO daily  Albuterol As needed   Albuterol Neb , may use as needed only -every 4-6hrs  Continue on Lasix 59m daily .  Low salt diet.  Follow up in 2 months with Dr. AElsworth Soho and As needed   Please contact office for sooner follow up if symptoms do not improve or worsen or seek emergency care

## 2020-05-10 NOTE — Assessment & Plan Note (Signed)
Continue on oxygen 2 L at rest and 3 L with activity to maintain O2 saturations greater than 88 to 90%.

## 2020-05-10 NOTE — Telephone Encounter (Signed)
Advised of results .

## 2020-05-13 DIAGNOSIS — J449 Chronic obstructive pulmonary disease, unspecified: Secondary | ICD-10-CM | POA: Diagnosis not present

## 2020-05-13 DIAGNOSIS — I5033 Acute on chronic diastolic (congestive) heart failure: Secondary | ICD-10-CM | POA: Diagnosis not present

## 2020-05-24 DIAGNOSIS — Z20822 Contact with and (suspected) exposure to covid-19: Secondary | ICD-10-CM | POA: Diagnosis not present

## 2020-05-31 ENCOUNTER — Other Ambulatory Visit: Payer: Self-pay

## 2020-05-31 MED ORDER — TRELEGY ELLIPTA 200-62.5-25 MCG/INH IN AEPB: 1.0000 | INHALATION_SPRAY | Freq: Every day | RESPIRATORY_TRACT | 3 refills | Status: DC

## 2020-06-01 ENCOUNTER — Other Ambulatory Visit: Payer: Self-pay | Admitting: Pulmonary Disease

## 2020-06-01 MED ORDER — TRELEGY ELLIPTA 200-62.5-25 MCG/INH IN AEPB: 1.0000 | INHALATION_SPRAY | Freq: Every day | RESPIRATORY_TRACT | 3 refills | Status: DC

## 2020-06-02 ENCOUNTER — Other Ambulatory Visit: Payer: Self-pay | Admitting: Pulmonary Disease

## 2020-06-02 MED ORDER — TRELEGY ELLIPTA 200-62.5-25 MCG/INH IN AEPB: 1.0000 | INHALATION_SPRAY | Freq: Every day | RESPIRATORY_TRACT | 3 refills | Status: DC

## 2020-06-08 ENCOUNTER — Other Ambulatory Visit: Payer: Self-pay | Admitting: Pulmonary Disease

## 2020-06-08 ENCOUNTER — Other Ambulatory Visit: Payer: Self-pay | Admitting: *Deleted

## 2020-06-08 NOTE — Telephone Encounter (Signed)
Okay to send

## 2020-06-08 NOTE — Telephone Encounter (Signed)
I called and spoke with the pharmacy and was told that the patient's insurance will not cover the Trelegy 200, they will only cover Trelegy 100-62.5.  Do you want [place] to send this prescription to the pharmacy?  Please advise.  Thank you.

## 2020-06-13 DIAGNOSIS — J449 Chronic obstructive pulmonary disease, unspecified: Secondary | ICD-10-CM | POA: Diagnosis not present

## 2020-06-13 DIAGNOSIS — I5033 Acute on chronic diastolic (congestive) heart failure: Secondary | ICD-10-CM | POA: Diagnosis not present

## 2020-06-20 DIAGNOSIS — H90A31 Mixed conductive and sensorineural hearing loss, unilateral, right ear with restricted hearing on the contralateral side: Secondary | ICD-10-CM | POA: Diagnosis not present

## 2020-06-20 DIAGNOSIS — H90A22 Sensorineural hearing loss, unilateral, left ear, with restricted hearing on the contralateral side: Secondary | ICD-10-CM | POA: Diagnosis not present

## 2020-06-22 ENCOUNTER — Telehealth: Payer: Self-pay | Admitting: *Deleted

## 2020-06-22 MED ORDER — TRELEGY ELLIPTA 100-62.5-25 MCG/INH IN AEPB: 1.0000 | INHALATION_SPRAY | Freq: Every day | RESPIRATORY_TRACT | 3 refills | Status: DC

## 2020-06-22 NOTE — Telephone Encounter (Signed)
Called and spoke with patient, made her aware that Trelegy 200 has been changed to Trelegy 100 d/t insurance is no covering Trelegy 200.  Patient verbalized understanding.  Script sent to pharmacy.  Nothing further needed.

## 2020-06-26 ENCOUNTER — Other Ambulatory Visit: Payer: Self-pay | Admitting: Internal Medicine

## 2020-06-27 DIAGNOSIS — H6501 Acute serous otitis media, right ear: Secondary | ICD-10-CM | POA: Diagnosis not present

## 2020-06-27 DIAGNOSIS — J324 Chronic pansinusitis: Secondary | ICD-10-CM | POA: Diagnosis not present

## 2020-06-27 DIAGNOSIS — J322 Chronic ethmoidal sinusitis: Secondary | ICD-10-CM | POA: Diagnosis not present

## 2020-06-27 DIAGNOSIS — H9113 Presbycusis, bilateral: Secondary | ICD-10-CM | POA: Diagnosis not present

## 2020-06-27 DIAGNOSIS — J329 Chronic sinusitis, unspecified: Secondary | ICD-10-CM | POA: Diagnosis not present

## 2020-06-27 DIAGNOSIS — J342 Deviated nasal septum: Secondary | ICD-10-CM | POA: Diagnosis not present

## 2020-06-27 DIAGNOSIS — J449 Chronic obstructive pulmonary disease, unspecified: Secondary | ICD-10-CM | POA: Diagnosis not present

## 2020-06-27 DIAGNOSIS — Z9981 Dependence on supplemental oxygen: Secondary | ICD-10-CM | POA: Diagnosis not present

## 2020-06-27 DIAGNOSIS — J3489 Other specified disorders of nose and nasal sinuses: Secondary | ICD-10-CM | POA: Diagnosis not present

## 2020-06-27 DIAGNOSIS — H748X1 Other specified disorders of right middle ear and mastoid: Secondary | ICD-10-CM | POA: Diagnosis not present

## 2020-07-14 DIAGNOSIS — J449 Chronic obstructive pulmonary disease, unspecified: Secondary | ICD-10-CM | POA: Diagnosis not present

## 2020-07-14 DIAGNOSIS — I5033 Acute on chronic diastolic (congestive) heart failure: Secondary | ICD-10-CM | POA: Diagnosis not present

## 2020-07-16 ENCOUNTER — Other Ambulatory Visit: Payer: Self-pay | Admitting: Pulmonary Disease

## 2020-07-17 ENCOUNTER — Encounter: Payer: Self-pay | Admitting: Pulmonary Disease

## 2020-07-17 ENCOUNTER — Other Ambulatory Visit: Payer: Self-pay

## 2020-07-17 ENCOUNTER — Ambulatory Visit: Payer: Medicare Other | Admitting: Pulmonary Disease

## 2020-07-17 VITALS — BP 124/78 | HR 87 | Temp 97.3°F | Ht 63.0 in | Wt 244.6 lb

## 2020-07-17 DIAGNOSIS — J9611 Chronic respiratory failure with hypoxia: Secondary | ICD-10-CM | POA: Diagnosis not present

## 2020-07-17 DIAGNOSIS — J9612 Chronic respiratory failure with hypercapnia: Secondary | ICD-10-CM

## 2020-07-17 DIAGNOSIS — J441 Chronic obstructive pulmonary disease with (acute) exacerbation: Secondary | ICD-10-CM | POA: Diagnosis not present

## 2020-07-17 DIAGNOSIS — I5032 Chronic diastolic (congestive) heart failure: Secondary | ICD-10-CM | POA: Diagnosis not present

## 2020-07-17 NOTE — Patient Instructions (Signed)
Ambulatory satn for POC Pulm rehab referral

## 2020-07-17 NOTE — Assessment & Plan Note (Signed)
Unfortunately she continues to desaturate on 5 L pulse so she is not a good candidate currently for POC. I encouraged her to once again enrolled in pulmonary rehab.  She has benefited from this in the past and hopefully with improving her conditioning and weight loss, improve oxygenation enough to stabilize on POC

## 2020-07-17 NOTE — Assessment & Plan Note (Signed)
Continue Lasix 40 mg daily, no evidence of fluid overload.  Or cor pulmonale

## 2020-07-17 NOTE — Assessment & Plan Note (Signed)
Continue Trelegy. Unfortunately she is not a candidate for Zephyr valve. She does not have significant hypercarbia so I do not think she will benefit from NIV.  Prior sleep study has not shown significant OSA

## 2020-07-17 NOTE — Progress Notes (Signed)
Subjective:    Patient ID: Rebecca Mcintyre, female    DOB: September 22, 1945, 75 y.o.   MRN: [id number]  HPI  75 yo ex-smoker for follow-up of COPD on home oxygenand pulmonary hypertension Identical twin of Rebecca Mcintyre ,my patient who passed away  PMH -CAD, diastolic HF, dyslipidemia, obesity   She was hospitalized 10/2018 for acute on chronic hypercarbic respiratory failure  She is accompanied by her daughter today and numerous questions were answered -If she a candidate for Zephyr valve? -Can she get portable  Oxygen.?  She had a slow to resolve COPD exacerbation 01/2020, required diuretics 02/2020 She had a visit with ENT for fluid in her left ear and received another course of antibiotics CT was obtained which confirmed infection.  After getting 3 rounds of antibiotics she feels the best she has in a while however she is short of breath on daily activities and requires oxygen 24/7. She is enrolled in a CPAP trial at [facility] which is for an injectable twice a month  I reviewed chest x-ray 01/2020, 10/21 and 05/15/2020 -which shows emphysematous changes scarring and prominent pulmonary artery without any definite infiltrate  Unfortunately on ambulation today, she continued to desaturate on 5 L pulse  Significant tests/ events reviewed  ABG 02/2020 7.4 2/44/50 3/87% PFTs8/08/2018 FVC 2.31 (82), FEV1 1.20 (56), ratio 52, no bronchodilatory response,DLCO 51 %  Imaging 6/10/20CTAChest-No PE; cardiomegaly andemphysematous changes of the lung  NPSG7/19/20 no obstructive sleep apnea, min O2 89%. Periodic limb movements did occur during sleep   2D echo November 04, 2018 EF 6065%, RV has moderately reduced systolic function, moderately enlarged and severely elevated pressure at 70.3 mmHg. Right atrium is moderately dilated. And tricuspid valve with mild to moderate regurg  2D echo January 19, 2019 showed EF normal, grade 2 diastolic dysfunction, normal RV size and function  compared to echo November 04, 2018 showed significant decrease in RV size and improvement in RV function. Right atrial pressure estimated at 3 mmHg and right atrial size is normal    Past Medical History:  Diagnosis Date  . Allergy   . ASCUS on Pap smear    had repeat x 2 normal with [name] Plan 2011  . Coronary artery disease   . Gout   . Hyperlipidemia   . Hypertension   . Myocardial infarction [facility])    2002 95% proximal LAD stenosis with thrombus followed by 90% stenosis, the first diagonal 80% stenosis, circumflex 30% stenosis, circumflex obtuse marginal subbranch 70% stenosis, PDA 40% stenosis. She had a drug-eluting stent placement with a Pixel stent  . Osteopenia   . Solitary kidney     Review of Systems neg for any significant sore throat, dysphagia, itching, sneezing, nasal congestion or excess/ purulent secretions, fever, chills, sweats, unintended wt loss, pleuritic or exertional cp, hempoptysis, orthopnea pnd or change in chronic leg swelling. Also denies presyncope, palpitations, heartburn, abdominal pain, nausea, vomiting, diarrhea or change in bowel or urinary habits, dysuria,hematuria, rash, arthralgias, visual complaints, headache, numbness weakness or ataxia.     Objective:   Physical Exam  Gen. Pleasant, obese, in no distress, normal affect ENT - no pallor,icterus, no post nasal drip, class 2-3 airway Neck: No JVD, no thyromegaly, no carotid bruits Lungs: no use of accessory muscles, no dullness to percussion, decreased without rales or rhonchi  Cardiovascular: Rhythm regular, heart sounds  normal, no murmurs or gallops, no peripheral edema Abdomen: soft and non-tender, no hepatosplenomegaly, BS normal. Musculoskeletal: No deformities, no cyanosis or  clubbing Neuro:  alert, non focal, no tremors       Assessment & Plan:

## 2020-07-18 NOTE — Telephone Encounter (Signed)
Dr. [name], please see mychart message sent by pt's daughter [name] along with the consent form in media section of mychart message. Thanks!

## 2020-07-18 NOTE — Telephone Encounter (Signed)
Noted. This is an [place]-[employer] mAb biologic trial for COPD

## 2020-07-20 NOTE — Telephone Encounter (Signed)
Based on our discussion, I reviewed the indications for Zephyr valve. I think her BMI will contraindicate, they want her BMI less than 34 and nurse case BMI currently is 43.  So weight loss is certainly necessary. This will help her breathing in other ways.  We can proceed with -high-resolution CT chest and PFTs to assess whether she could be a candidate in the future

## 2020-08-03 ENCOUNTER — Other Ambulatory Visit: Payer: Self-pay | Admitting: Internal Medicine

## 2020-08-03 ENCOUNTER — Other Ambulatory Visit: Payer: Self-pay

## 2020-08-03 MED ORDER — ALLOPURINOL 100 MG PO TABS: 100.0000 mg | ORAL_TABLET | Freq: Two times a day (BID) | ORAL | 0 refills | Status: DC

## 2020-08-06 NOTE — Progress Notes (Unsigned)
Cardiology Office Note   Date:  08/07/2020   ID:  [name], DOB [birth year], MRN [id number]  PCP:  Burnis Medin, MD  Cardiologist:   No primary care provider on file.   Chief Complaint  Patient presents with  . Coronary Artery Disease      History of Present Illness: Rebecca Mcintyre is a 75 y.o. female who presents for evaluation of CAD. I saw her in 2014.  She has a history of an anterior MI in 2002.   She had DES to the LAD.    She was in the hospital after syncopal episode in June 2020.    She had acute hypoxic respiratory failure.  She had acute on chronic diastolic dysfunction.  She had acute cor pulmonale.  She did require some diuresis.  She had an echocardiogram which demonstrated well-preserved left ventricular function with some suggestion of diastolic dysfunction.  There was elevated pulmonary pressures noted.  She had moderate RV dysfunction.  She did have diuresis of 10.2 L.  She was sent home on 40 mg of Lasix.  She was also sent home on home O2 and pulmonary meds as listed.  She did have a follow-up echo.  Her pulmonary pressures seem to be reduced and RV function improved.    Since I last saw her she had pneumonia in the fall.  She been on oxygen only at night up until that point but now she is on it over time.  She has been on and off steroids.  She gained about 30 pounds.  She is in a trial at [place].  She says this is helped her get extra nursing help.  She is followed closely by Dr. Chase Caller and is being referred to pulmonary rehab again.  The patient denies any new symptoms such as chest discomfort, neck or arm discomfort. There has been no new shortness of breath, PND or orthopnea. There have been no reported palpitations, presyncope or syncope.    She does have some days where she has some increased dyspnea.  She will notice this with usual activities such as taking shower.  She does not particularly watch her salt which could  contribute.   Past Medical History:  Diagnosis Date  . Allergy   . ASCUS on Pap smear    had repeat x 2 normal with [name] Plan 2011  . Coronary artery disease   . Gout   . Hyperlipidemia   . Hypertension   . Myocardial infarction [facility])    2002 95% proximal LAD stenosis with thrombus followed by 90% stenosis, the first diagonal 80% stenosis, circumflex 30% stenosis, circumflex obtuse marginal subbranch 70% stenosis, PDA 40% stenosis. She had a drug-eluting stent placement with a Pixel stent  . Osteopenia   . Solitary kidney     Past Surgical History:  Procedure Laterality Date  . CARDIAC CATHETERIZATION    . CORONARY ANGIOPLASTY    . CORONARY ANGIOPLASTY WITH STENT PLACEMENT  2002  . [place]  2011  . LAD Stent angioplasty  2002  . Right oophorectomy with salpingectomy     benign growth Dr. Joneen Caraway     Current Outpatient Medications  Medication Sig Dispense Refill  . acetaminophen (TYLENOL) 500 MG tablet Take 1,000 mg by mouth every 6 (six) hours as needed for mild pain.    [name] albuterol (PROVENTIL) (2.5 MG/3ML) 0.083% nebulizer solution Take 3 mLs (2.5 mg total) by nebulization every 6 (six) hours  as needed for wheezing or shortness of breath. 300 mL 11  . allopurinol (ZYLOPRIM) 100 MG tablet Take 1 tablet (100 mg total) by mouth 2 (two) times daily. 180 tablet 0  . aspirin 81 MG tablet Take 81 mg by mouth daily.    . dapagliflozin propanediol (FARXIGA) 5 MG TABS tablet Take 1 tablet (5 mg total) by mouth daily before breakfast. 30 tablet 30  . Fluticasone-Umeclidin-Vilant (TRELEGY ELLIPTA) 100-62.5-25 MCG/INH AEPB Inhale 1 puff into the lungs daily. 60 each 3  . furosemide (LASIX) 40 MG tablet TAKE 1 TABLET BY MOUTH EVERY DAY 90 tablet 0  . KLOR-CON M20 20 MEQ tablet TAKE 1 TABLET BY MOUTH EVERY DAY 90 tablet 1  . metFORMIN (GLUCOPHAGE) 500 MG tablet Take 1 tablet (500 mg total) by mouth 2 (two) times daily with a meal. Increase to 2 twice a day with a meal. 360  tablet 1  . metoprolol tartrate (LOPRESSOR) 25 MG tablet TAKE 1/2 TABLETS BY MOUTH 2 TIMES DAILY 90 tablet 0  . montelukast (SINGULAIR) 10 MG tablet TAKE 1 TABLET (10 MG TOTAL) AT BEDTIME BY MOUTH. 90 tablet 0  . nitroGLYCERIN (NITROSTAT) 0.4 MG SL tablet PLACE 1 TABLET UNDER THE TONGUE EVERY 5 MINUTES AS NEEDED. 25 tablet 1  . PROAIR HFA 108 (90 Base) MCG/ACT inhaler TAKE 2 PUFFS BY MOUTH EVERY 6 HOURS AS NEEDED FOR WHEEZE OR SHORTNESS OF BREATH 8.5 each 3  . rOPINIRole (REQUIP) 0.5 MG tablet START 1/2 TAB AT BEDTIME X 2 NIGHTS THEN INCREASE TO 1 TAB AT BEDTIME FOR RESTLESS LEG 90 tablet 2  . simvastatin (ZOCOR) 40 MG tablet Take 1 tablet (40 mg total) by mouth at bedtime. 90 tablet 3   No current facility-administered medications for this visit.    Allergies:   Atorvastatin    ROS:  Please see the history of present illness.   Otherwise, review of systems are positive for none.   All other systems are reviewed and negative.    PHYSICAL EXAM: VS:  BP (!) 104/56   Pulse 89   Ht _0  (1.6 m)   Wt 245 lb (111.1 kg)   SpO2 95%   BMI 43.40 kg/m  , BMI Body mass index is 43.4 kg/m. GENERAL:  Well appearing NECK:  No jugular venous distention, waveform within normal limits, carotid upstroke brisk and symmetric, no bruits, no thyromegaly LUNGS:  Clear to auscultation bilaterally CHEST:  Unremarkable HEART:  PMI not displaced or sustained,S1 and S2 within normal limits, no S3, no S4, no clicks, no rubs, no murmurs ABD:  Flat, positive bowel sounds normal in frequency in pitch, no bruits, no rebound, no guarding, no midline pulsatile mass, no hepatomegaly, no splenomegaly EXT:  2 plus pulses throughout, no edema, no cyanosis no clubbing     EKG:  EKG  ordered today. Sinus rhythm, rate 89, axis within normal limits, intervals within normal limits, poor anterior R wave progression, no acute ST-T wave changes.   Recent Labs: 02/03/2020: Pro B Natriuretic peptide (BNP) 158.0 03/27/2020:  ALT 34; BUN 16; Creatinine, Ser 0.78; Hemoglobin 14.4; Platelets 239.0; Potassium 4.2; Sodium 140; TSH 3.46    Lipid Panel    Component Value Date/Time   CHOL 155 03/27/2020 0806   TRIG 148.0 03/27/2020 0806   HDL 36.50 (L) 03/27/2020 0806   CHOLHDL 4 03/27/2020 0806   VLDL 29.6 03/27/2020 0806   LDLCALC 89 03/27/2020 0806   LDLDIRECT 116.0 02/05/2019 1105      Wt  Readings from Last 3 Encounters:  08/07/20 245 lb (111.1 kg)  07/17/20 244 lb 9.6 oz (110.9 kg)  05/09/20 242 lb (109.8 kg)      Other studies Reviewed: Additional studies/ records that were reviewed today include: Labs, pulmonary notes, [facility] records Review of the above records demonstrates:  Please see elsewhere in the note.     ASSESSMENT AND PLAN:  CAD:   The patient has no new sypmtoms.  No further cardiovascular testing is indicated.  We will continue with aggressive risk reduction and meds as listed.  DYSLIPIDEMIA:     LDL was 89.  She does not tolerate atorvastatin.  Needs the meds as listed.   HTN:  The blood pressure is at target.  No change in therapy.    DIABETES:  Her hemoglobin A1c was 10.1 which is up from 6.7.  However, she was just started on Farxiga.  No change in therapy.   CHRONIC DIASTOLIC DYSFUNCTION:   I think this could be playing some role in her shortness of breath.  We talked about watching her salt and she does not do this particularly.  She might take an extra half a dose of Lasix when she has increased dyspnea as it seems to happen sporadically.   Current medicines are reviewed at length with the patient today.  The patient does not have concerns regarding medicines.  The following changes have been made:  None  Labs/ tests ordered today include:    None  Orders Placed This Encounter  Procedures  . EKG 12-Lead     Disposition:   FU with me in 12 months.      Signed, Minus Breeding, MD  08/07/2020 11:02 AM    [place]

## 2020-08-07 ENCOUNTER — Other Ambulatory Visit: Payer: Self-pay

## 2020-08-07 ENCOUNTER — Ambulatory Visit: Payer: Medicare Other | Admitting: Cardiology

## 2020-08-07 ENCOUNTER — Encounter: Payer: Self-pay | Admitting: Cardiology

## 2020-08-07 VITALS — BP 104/56 | HR 89 | Ht 63.0 in | Wt 245.0 lb

## 2020-08-07 DIAGNOSIS — E785 Hyperlipidemia, unspecified: Secondary | ICD-10-CM | POA: Diagnosis not present

## 2020-08-07 DIAGNOSIS — I5032 Chronic diastolic (congestive) heart failure: Secondary | ICD-10-CM | POA: Diagnosis not present

## 2020-08-07 DIAGNOSIS — I2510 Atherosclerotic heart disease of native coronary artery without angina pectoris: Secondary | ICD-10-CM

## 2020-08-07 DIAGNOSIS — H903 Sensorineural hearing loss, bilateral: Secondary | ICD-10-CM | POA: Diagnosis not present

## 2020-08-07 NOTE — Patient Instructions (Signed)
Medication Instructions:  Continue current medications  *If you need a refill on your cardiac medications before your next appointment, please call your pharmacy*   Lab Work: None ordered   Testing/Procedures: None ordered   Follow-Up: At [facility], you and your health needs are our priority.  As part of our continuing mission to provide you with exceptional heart care, we have created designated Provider Care Teams.  These Care Teams include your primary Cardiologist (physician) and Advanced Practice Providers (APPs -  Physician Assistants and Nurse Practitioners) who all work together to provide you with the care you need, when you need it.  We recommend signing up for the patient portal called "MyChart".  Sign up information is provided on this After Visit Summary.  MyChart is used to connect with patients for Virtual Visits (Telemedicine).  Patients are able to view lab/test results, encounter notes, upcoming appointments, etc.  Non-urgent messages can be sent to your provider as well.   To learn more about what you can do with MyChart, go to [email or web address].    Your next appointment:   1 year(s)  The format for your next appointment:   In Person  Provider:   You may see Minus Breeding, MD or one of the following Advanced Practice Providers on your designated Care Team:    Rosaria Ferries, PA-C  Jory Sims, DNP, ANP

## 2020-08-07 NOTE — Telephone Encounter (Signed)
Okay to wait until she loses weight

## 2020-08-11 DIAGNOSIS — J449 Chronic obstructive pulmonary disease, unspecified: Secondary | ICD-10-CM | POA: Diagnosis not present

## 2020-08-11 DIAGNOSIS — I5033 Acute on chronic diastolic (congestive) heart failure: Secondary | ICD-10-CM | POA: Diagnosis not present

## 2020-08-17 ENCOUNTER — Telehealth: Payer: Self-pay | Admitting: Pulmonary Disease

## 2020-08-17 NOTE — Telephone Encounter (Signed)
[facility], spoke with Ander Purpura.  Lauren stated medical release was faxed requesting documentation of Patient having pneumonia. Lauren stated Patient is trying to get in a research study and documentation is needed. OV notes and cxr faxed to [phone]. Nothing further at this time.

## 2020-08-24 ENCOUNTER — Telehealth: Payer: Self-pay | Admitting: Pulmonary Disease

## 2020-08-24 NOTE — Telephone Encounter (Signed)
Spoke with Ander Purpura and notified her that pt had started Trelegy on 05/09/2020 according to OV notes from that date. Lauren stated understanding. Nothing further needed at this time.

## 2020-08-30 ENCOUNTER — Other Ambulatory Visit: Payer: Self-pay | Admitting: Primary Care

## 2020-08-31 NOTE — Telephone Encounter (Signed)
Dr. [name], please advise if you are okay refilling med.

## 2020-09-01 NOTE — Telephone Encounter (Signed)
Okay to refill, 1 tablet at bedtime x1 month with 3 refills

## 2020-09-04 MED ORDER — METFORMIN HCL 500 MG PO TABS: 500.0000 mg | ORAL_TABLET | Freq: Two times a day (BID) | ORAL | 0 refills | Status: DC

## 2020-09-05 ENCOUNTER — Other Ambulatory Visit: Payer: Self-pay | Admitting: Pulmonary Disease

## 2020-09-05 ENCOUNTER — Other Ambulatory Visit: Payer: Self-pay

## 2020-09-05 MED ORDER — METOPROLOL TARTRATE 25 MG PO TABS: ORAL_TABLET | ORAL | 1 refills | Status: DC

## 2020-09-05 NOTE — Telephone Encounter (Signed)
Dr. Alva, please advise if you are okay refilling med. 

## 2020-09-11 DIAGNOSIS — J449 Chronic obstructive pulmonary disease, unspecified: Secondary | ICD-10-CM | POA: Diagnosis not present

## 2020-09-11 DIAGNOSIS — I5033 Acute on chronic diastolic (congestive) heart failure: Secondary | ICD-10-CM | POA: Diagnosis not present

## 2020-09-25 ENCOUNTER — Telehealth (HOSPITAL_COMMUNITY): Payer: Self-pay

## 2020-09-25 NOTE — Telephone Encounter (Signed)
Called patient to see if she is interested in the Pulmonary Rehab Program. Patient expressed interest. Explained scheduling process and went over insurance, patient verbalized understanding. Also adv pt where we are with scheduling for PR and that we have a back log.

## 2020-09-25 NOTE — Telephone Encounter (Signed)
Pt insurance is active and benefits verified through [facility] [employer]. Co-pay $20.00, DED $0.00/$0.00 met, out of pocket $3,600.00/$268.66 met, co-insurance 0%. No pre-authorization required. [place] [employer], 09/25/20 @ 10AM, REF#[id number]  Will contact patient to see if she is interested in the Pulmonary Rehab Program.

## 2020-09-26 DIAGNOSIS — H6521 Chronic serous otitis media, right ear: Secondary | ICD-10-CM | POA: Diagnosis not present

## 2020-10-02 ENCOUNTER — Other Ambulatory Visit: Payer: Self-pay

## 2020-10-02 NOTE — Progress Notes (Signed)
Chief Complaint  Patient presents with  . Follow-up    HPI: Rebecca Mcintyre 75 y.o. come in for Chronic disease management   Last  cpx 11 21 she is here today with her daughter. She is followed by cardiology and pulmonary :reported appear to be stable although feels that would be good ""if her breathing can get right" she is on 2 L.  This past month had  a course of prednisone because there was felt to be a possible flare of her lung disease.  She is in a research study that helps get her inhalers cost covered.     BG: on Farxiga 5 mgWhen started medication blood sugar was better about 125-140  And now   165 range.     Am 200  At night. 1` last steroid was a few weeks ago.  Is on max dose of metformin.  Gets puffy feet and swelling down in the morning then can increase is taking Lasix 40 mg a day for this is allowed to take an extra per Dr. Percival Spanish.  She is post to be on daily potassium but sometimes skips it because its pill is hard to swallow.  Daughter wants to make sure she is checked out for other causes of fatigue over and above her lung disease.  A hard time losing weight because she is not as active but knows what she needs to do.  Needed tube in ear recenttly dr Constance Holster  ROS: See pertinent positives and negatives per HPI.  Past Medical History:  Diagnosis Date  . Allergy   . ASCUS on Pap smear    had repeat x 2 normal with [name] Plan 2011  . Coronary artery disease   . Gout   . Hyperlipidemia   . Hypertension   . Myocardial infarction [facility])    2002 95% proximal LAD stenosis with thrombus followed by 90% stenosis, the first diagonal 80% stenosis, circumflex 30% stenosis, circumflex obtuse marginal subbranch 70% stenosis, PDA 40% stenosis. She had a drug-eluting stent placement with a Pixel stent  . Osteopenia   . Solitary kidney     Family History  Problem Relation Age of Onset  . Breast cancer Mother 21  . Lung cancer Father   . Heart attack Brother         Died age 2 MI  . Colon cancer Brother   . Heart disease Daughter 65       smoker and overweight   . Esophageal cancer Neg Hx   . Pancreatic cancer Neg Hx   . Rectal cancer Neg Hx   . Stomach cancer Neg Hx     Social History   Socioeconomic History  . Marital status: Single    Spouse name: Not on file  . Number of children: 2  . Years of education: Not on file  . Highest education level: Not on file  Occupational History  . Occupation: semi retired    Fish farm manager: Clinical research associate  Tobacco Use  . Smoking status: Former Smoker    Packs/day: 1.00    Years: 39.00    Pack years: 39.00    Types: Cigarettes    Quit date: 11/04/2018    Years since quitting: 1.9  . Smokeless tobacco: Never Used  . Tobacco comment: 39  Vaping Use  . Vaping Use: Never used  Substance and Sexual Activity  . Alcohol use: Yes    Comment: 1-2 per month  . Drug use: No  . Sexual activity: Not  on file  Other Topics Concern  . 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    No pets      Social Determinants of Radio broadcast assistant Strain: [place]   . Difficulty of Paying Living Expenses: Not hard at all  Food Insecurity: No Food Insecurity  . Worried About Charity fundraiser in the Last Year: Never true  . Ran Out of Food in the Last Year: Never true  Transportation Needs: No Transportation Needs  . Lack of Transportation (Medical): No  . Lack of Transportation (Non-Medical): No  Physical Activity: Sufficiently Active  . Days of Exercise per Week: 5 days  . Minutes of Exercise per Session: 30 min  Stress: No Stress Concern Present  . Feeling of Stress : Not at all  Social Connections: Moderately Integrated  . Frequency of Communication with Friends and Family: More than three times a week  . Frequency of Social Gatherings with Friends and Family: More than three  times a week  . Attends Religious Services: More than 4 times per year  . Active Member of Clubs or Organizations: Yes  . Attends Archivist Meetings: 1 to 4 times per year  . Marital Status: Divorced    Outpatient Medications Prior to Visit  Medication Sig Dispense Refill  . acetaminophen (TYLENOL) 500 MG tablet Take 1,000 mg by mouth every 6 (six) hours as needed for mild pain.    [name] albuterol (PROVENTIL) (2.5 MG/3ML) 0.083% nebulizer solution Take 3 mLs (2.5 mg total) by nebulization every 6 (six) hours as needed for wheezing or shortness of breath. 300 mL 11  . allopurinol (ZYLOPRIM) 100 MG tablet Take 1 tablet (100 mg total) by mouth 2 (two) times daily. 180 tablet 0  . aspirin 81 MG tablet Take 81 mg by mouth daily.    . Fluticasone-Umeclidin-Vilant (TRELEGY ELLIPTA) 100-62.5-25 MCG/INH AEPB Inhale 1 puff into the lungs daily. 60 each 3  . KLOR-CON M20 20 MEQ tablet TAKE 1 TABLET BY MOUTH EVERY DAY 90 tablet 1  . metFORMIN (GLUCOPHAGE) 500 MG tablet Take 1 tablet (500 mg total) by mouth 2 (two) times daily with a meal. Increase to 2 twice a day with a meal. 360 tablet 0  . metoprolol tartrate (LOPRESSOR) 25 MG tablet TAKE 1/2 TABLETS BY MOUTH 2 TIMES DAILY 90 tablet 1  . montelukast (SINGULAIR) 10 MG tablet TAKE 1 TABLET (10 MG TOTAL) AT BEDTIME BY MOUTH. 90 tablet 0  . nitroGLYCERIN (NITROSTAT) 0.4 MG SL tablet PLACE 1 TABLET UNDER THE TONGUE EVERY 5 MINUTES AS NEEDED. 25 tablet 1  . PROAIR HFA 108 (90 Base) MCG/ACT inhaler TAKE 2 PUFFS BY MOUTH EVERY 6 HOURS AS NEEDED FOR WHEEZE OR SHORTNESS OF BREATH 8.5 each 3  . rOPINIRole (REQUIP) 0.5 MG tablet 1 TAB AT BEDTIME FOR RESTLESS LEG 90 tablet 0  . simvastatin (ZOCOR) 40 MG tablet Take 1 tablet (40 mg total) by mouth at bedtime. 90 tablet 3  . dapagliflozin propanediol (FARXIGA) 5 MG TABS tablet Take 1 tablet (5 mg total) by mouth daily before breakfast. 30 tablet 30  . furosemide (LASIX) 40 MG tablet TAKE 1 TABLET BY MOUTH  EVERY DAY 90 tablet 0   No facility-administered medications prior to visit.     EXAM:  BP 116/70 (  BP Location: Left Arm, Patient Position: Sitting, Cuff Size: Large)   Pulse 76   Temp 98.1 F (36.7 C) (Oral)   Ht _0  (1.6 m)   Wt 250 lb 3.2 oz (113.5 kg)   SpO2 92%   BMI 44.32 kg/m   Body mass index is 44.32 kg/m.  GENERAL: vitals reviewed and listed above, alert, oriented, appears well hydrated and in no acute distress on O2 nasal cannula hard of hearing but normal conversation HEENT: atraumatic, conjunctiva  clear, no obvious abnormalities on inspection of external nose and ears OP : masked  NECK: no obvious masses on inspection palpation  LUNGS: clear to auscultation bilaterally, no wheezes, rales or rhonchi, decreased breath sounds throughout no nasal flaring or stridor CV: HRRR, no clubbing cyanosis 1+ peripheral edema nl cap refill no feet ulcers or lesions. MS: moves all extremities without noticeable focal  abnormality PSYCH: pleasant and cooperative, no obvious depression or anxiety Lab Results  Component Value Date   WBC 6.2 03/27/2020   HGB 14.4 03/27/2020   HCT 43.9 03/27/2020   PLT 239.0 03/27/2020   GLUCOSE 172 (H) 03/27/2020   CHOL 155 03/27/2020   TRIG 148.0 03/27/2020   HDL 36.50 (L) 03/27/2020   LDLDIRECT 116.0 02/05/2019   LDLCALC 89 03/27/2020   ALT 34 03/27/2020   AST 46 (H) 03/27/2020   NA 140 03/27/2020   K 4.2 03/27/2020   CL 100 03/27/2020   CREATININE 0.78 03/27/2020   BUN 16 03/27/2020   CO2 32 03/27/2020   TSH 3.46 03/27/2020   HGBA1C 10.1 (H) 03/27/2020   MICROALBUR 2.8 (H) 03/27/2020   BP Readings from Last 3 Encounters:  10/03/20 116/70  08/07/20 (!) 104/56  07/17/20 124/78    ASSESSMENT AND PLAN:  Discussed the following assessment and plan:  Type 2 diabetes mellitus with hyperglycemia, without long-term current use of insulin (HCC) - Plan: Basic metabolic panel, CBC with Differential/Platelet, Hemoglobin A1c, Hepatic  function panel, TSH, T4, free, Brain Natriuretic Peptide, Brain Natriuretic Peptide, T4, free, TSH, Hepatic function panel, Hemoglobin A1c, CBC with Differential/Platelet, Basic metabolic panel  Medication management - Plan: Basic metabolic panel, CBC with Differential/Platelet, Hemoglobin A1c, Hepatic function panel, TSH, T4, free, Brain Natriuretic Peptide, Vitamin B12, Vitamin B12, Brain Natriuretic Peptide, T4, free, TSH, Hepatic function panel, Hemoglobin A1c, CBC with Differential/Platelet, Basic metabolic panel  Other fatigue - Plan: Basic metabolic panel, CBC with Differential/Platelet, Hemoglobin A1c, Hepatic function panel, TSH, T4, free, Brain Natriuretic Peptide, Vitamin B12, Vitamin B12, Brain Natriuretic Peptide, T4, free, TSH, Hepatic function panel, Hemoglobin A1c, CBC with Differential/Platelet, Basic metabolic panel  Essential hypertension - Plan: Basic metabolic panel, CBC with Differential/Platelet, Hemoglobin A1c, Hepatic function panel, TSH, T4, free, Brain Natriuretic Peptide, Brain Natriuretic Peptide, T4, free, TSH, Hepatic function panel, Hemoglobin A1c, CBC with Differential/Platelet, Basic metabolic panel  Centrilobular emphysema (HCC) - Plan: Basic metabolic panel, CBC with Differential/Platelet, Hemoglobin A1c, Hepatic function panel, TSH, T4, free, Brain Natriuretic Peptide, Brain Natriuretic Peptide, T4, free, TSH, Hepatic function panel, Hemoglobin A1c, CBC with Differential/Platelet, Basic metabolic panel  Low serum vitamin B12 at risk on metformin and fatigue - Plan: Vitamin B12, Vitamin B12 Lab update today checking for metabolic factors diabetes that could add to her fatigue although she looks stable  today. Increase her [place] to 10 mg and go from there depending on results.  Plan follow-up in 3 to 4 months. Will change potassium to smaller pills as indicated to  Help with ease of swallowing ` Wt  Readings from Last 3 Encounters:  10/03/20 250 lb 3.2 oz (113.5  kg)  08/07/20 245 lb (111.1 kg)  07/17/20 244 lb 9.6 oz (110.9 kg)    -Patient advised to return or notify health care team  if  new concerns arise. In interim   Patient Instructions  Weight each day  To follow fluid status.  And may need extra dose of  Lasix. Increasing farxiga  Dose .to 10 mg per day  Lab  Today for update  other causes of fatigue .    Plan followup depending or 3-4 months .      Standley Brooking. Delphin Funes M.D.

## 2020-10-03 ENCOUNTER — Encounter: Payer: Self-pay | Admitting: Internal Medicine

## 2020-10-03 ENCOUNTER — Ambulatory Visit (INDEPENDENT_AMBULATORY_CARE_PROVIDER_SITE_OTHER): Payer: Medicare Other | Admitting: Internal Medicine

## 2020-10-03 VITALS — BP 116/70 | HR 76 | Temp 98.1°F | Ht 63.0 in | Wt 250.2 lb

## 2020-10-03 DIAGNOSIS — E538 Deficiency of other specified B group vitamins: Secondary | ICD-10-CM | POA: Diagnosis not present

## 2020-10-03 DIAGNOSIS — Z79899 Other long term (current) drug therapy: Secondary | ICD-10-CM

## 2020-10-03 DIAGNOSIS — I10 Essential (primary) hypertension: Secondary | ICD-10-CM

## 2020-10-03 DIAGNOSIS — E1165 Type 2 diabetes mellitus with hyperglycemia: Secondary | ICD-10-CM

## 2020-10-03 DIAGNOSIS — R5383 Other fatigue: Secondary | ICD-10-CM

## 2020-10-03 DIAGNOSIS — J432 Centrilobular emphysema: Secondary | ICD-10-CM

## 2020-10-03 LAB — CBC WITH DIFFERENTIAL/PLATELET
Basophils Absolute: 0 10*3/uL (ref 0.0–0.1)
Basophils Relative: 0.7 % (ref 0.0–3.0)
Eosinophils Absolute: 0.3 10*3/uL (ref 0.0–0.7)
Eosinophils Relative: 3.8 % (ref 0.0–5.0)
HCT: 44.4 % (ref 36.0–46.0)
Hemoglobin: 14.6 g/dL (ref 12.0–15.0)
Lymphocytes Relative: 36.2 % (ref 12.0–46.0)
Lymphs Abs: 2.6 10*3/uL (ref 0.7–4.0)
MCHC: 32.9 g/dL (ref 30.0–36.0)
MCV: 90.5 fl (ref 78.0–100.0)
Monocytes Absolute: 0.7 10*3/uL (ref 0.1–1.0)
Monocytes Relative: 9.1 % (ref 3.0–12.0)
Neutro Abs: 3.6 10*3/uL (ref 1.4–7.7)
Neutrophils Relative %: 50.2 % (ref 43.0–77.0)
Platelets: 218 10*3/uL (ref 150.0–400.0)
RBC: 4.91 Mil/uL (ref 3.87–5.11)
RDW: 13.9 % (ref 11.5–15.5)
WBC: 7.2 10*3/uL (ref 4.0–10.5)

## 2020-10-03 LAB — BASIC METABOLIC PANEL
BUN: 19 mg/dL (ref 6–23)
CO2: 34 mEq/L — ABNORMAL HIGH (ref 19–32)
Calcium: 10.3 mg/dL (ref 8.4–10.5)
Chloride: 97 mEq/L (ref 96–112)
Creatinine, Ser: 0.8 mg/dL (ref 0.40–1.20)
GFR: 72.8 mL/min (ref >60.00)
Glucose, Bld: 170 mg/dL — ABNORMAL HIGH (ref 70–99)
Potassium: 4.5 mEq/L (ref 3.5–5.1)
Sodium: 142 mEq/L (ref 135–145)

## 2020-10-03 LAB — HEPATIC FUNCTION PANEL
ALT: 22 U/L (ref 0–35)
AST: 25 U/L (ref 0–37)
Albumin: 4 g/dL (ref 3.5–5.2)
Alkaline Phosphatase: 80 U/L (ref 39–117)
Bilirubin, Direct: 0.1 mg/dL (ref 0.0–0.3)
Total Bilirubin: 0.5 mg/dL (ref 0.2–1.2)
Total Protein: 6.9 g/dL (ref 6.0–8.3)

## 2020-10-03 LAB — TSH: TSH: 3.37 u[IU]/mL (ref 0.35–4.50)

## 2020-10-03 LAB — BRAIN NATRIURETIC PEPTIDE: Pro B Natriuretic peptide (BNP): 120 pg/mL — ABNORMAL HIGH (ref 0.0–100.0)

## 2020-10-03 LAB — VITAMIN B12: Vitamin B-12: 232 pg/mL (ref 211–911)

## 2020-10-03 LAB — T4, FREE: Free T4: 0.81 ng/dL (ref 0.60–1.60)

## 2020-10-03 LAB — HEMOGLOBIN A1C: Hgb A1c MFr Bld: 7.4 % — ABNORMAL HIGH (ref 4.6–6.5)

## 2020-10-03 MED ORDER — FUROSEMIDE 40 MG PO TABS: 40.0000 mg | ORAL_TABLET | Freq: Every day | ORAL | 1 refills | Status: DC

## 2020-10-03 MED ORDER — DAPAGLIFLOZIN PROPANEDIOL 10 MG PO TABS
10.0000 mg | ORAL_TABLET | Freq: Every day | ORAL | 3 refills | Status: DC
Start: 2020-10-03 — End: 2021-03-02

## 2020-10-03 NOTE — Patient Instructions (Addendum)
Weight each day  To follow fluid status.  And may need extra dose of  Lasix. Increasing farxiga  Dose .to 10 mg per day  Lab  Today for update  other causes of fatigue .    Plan followup depending or 3-4 months .

## 2020-10-04 ENCOUNTER — Other Ambulatory Visit: Payer: Self-pay | Admitting: Internal Medicine

## 2020-10-04 MED ORDER — POTASSIUM CHLORIDE CRYS ER 10 MEQ PO TBCR: 10.0000 meq | EXTENDED_RELEASE_TABLET | Freq: Two times a day (BID) | ORAL | 5 refills | Status: DC

## 2020-10-04 NOTE — Progress Notes (Signed)
Hemoglobin A1c is better than last time but not at goal so increasing the Farxiga dose could help. Potassium is oka we will send in a's smaller pill of 10 mEq to take 1 twice a day instead of the large 20 . You are not anemic but your B12 level is on the low side of normal.  I would like you to take B12 over-the-counter 1000 mcg a day every day we talked about just a regular vitamin with B but I would add this extra i vitamin. We can recheck your blood sugar A1c at your follow-up visit

## 2020-10-05 NOTE — Telephone Encounter (Signed)
Spoke with the pt and informed her of the results-see results note.

## 2020-10-09 DIAGNOSIS — H903 Sensorineural hearing loss, bilateral: Secondary | ICD-10-CM | POA: Diagnosis not present

## 2020-10-11 DIAGNOSIS — I5033 Acute on chronic diastolic (congestive) heart failure: Secondary | ICD-10-CM | POA: Diagnosis not present

## 2020-10-11 DIAGNOSIS — J449 Chronic obstructive pulmonary disease, unspecified: Secondary | ICD-10-CM | POA: Diagnosis not present

## 2020-10-13 ENCOUNTER — Telehealth (HOSPITAL_COMMUNITY): Payer: Self-pay

## 2020-10-13 NOTE — Telephone Encounter (Signed)
Called patient to see if she was interested in participating in the Pulmonary Rehab Program. Patient stated yes. Patient will come in for orientation on 11/20/20 @ 9AM and will attend the 10:15AM exercise class.  Tourist information centre manager.

## 2020-10-16 ENCOUNTER — Telehealth: Payer: Self-pay | Admitting: Pharmacist

## 2020-10-16 NOTE — Chronic Care Management (AMB) (Addendum)
Chronic Care Management Pharmacy Assistant   Name: Rebecca Mcintyre  MRN: [id number] DOB: Apr 14, 1946  Reason for Encounter: General Adherence Call       Recent office visits:  05.10.2022 Burnis Medin, MD patient present for DM2 follow-up and labs Medication change dapagliflozin propanediol (FARXIGA) 10 MG TABS tablet From lab results: Started B-12 1000 mcg- OTC Recent consult visits:  05.03.2022 Beckie Salts, MD Otolaryngology patient present follow-up 03.14.2022 Minus Breeding, MD Cardiology: patient present for CAD follow-up 02.21.2022 Rigoberto Noel, MD Pulmonary Disease: patient present for COPD and pulmonary hypertension follow-up. 02.01.2022 Beckie Salts, MD Otolaryngology patient present follow-up 01.25.2022 Beckie Salts, MD Otolaryngology patient present for hearing test 12.14.2021 Parrett, Fonnie Mu, NP Pulmonary Disease - patient present for follow-up for COPD Medication change: Begin TRELEGY 1 puff daily and Stop ANORO daily 12.13.2021Gould, [facility]- follow-up for Type 2 diabetes mellitus without complications  Hospital visits:  None in previous 6 months  Medications: Outpatient Encounter Medications as of 10/16/2020  Medication Sig   acetaminophen (TYLENOL) 500 MG tablet Take 1,000 mg by mouth every 6 (six) hours as needed for mild pain.   albuterol (PROVENTIL) (2.5 MG/3ML) 0.083% nebulizer solution Take 3 mLs (2.5 mg total) by nebulization every 6 (six) hours as needed for wheezing or shortness of breath.   allopurinol (ZYLOPRIM) 100 MG tablet Take 1 tablet (100 mg total) by mouth 2 (two) times daily.   aspirin 81 MG tablet Take 81 mg by mouth daily.   dapagliflozin propanediol (FARXIGA) 10 MG TABS tablet Take 1 tablet (10 mg total) by mouth daily before breakfast.   Fluticasone-Umeclidin-Vilant (TRELEGY ELLIPTA) 100-62.5-25 MCG/INH AEPB Inhale 1 puff into the lungs daily.   furosemide (LASIX) 40 MG tablet Take 1 tablet (40 mg total) by mouth daily.    metFORMIN (GLUCOPHAGE) 500 MG tablet Take 1 tablet (500 mg total) by mouth 2 (two) times daily with a meal. Increase to 2 twice a day with a meal.   metoprolol tartrate (LOPRESSOR) 25 MG tablet TAKE 1/2 TABLETS BY MOUTH 2 TIMES DAILY   montelukast (SINGULAIR) 10 MG tablet TAKE 1 TABLET (10 MG TOTAL) AT BEDTIME BY MOUTH.   nitroGLYCERIN (NITROSTAT) 0.4 MG SL tablet PLACE 1 TABLET UNDER THE TONGUE EVERY 5 MINUTES AS NEEDED.   potassium chloride (KLOR-CON) 10 MEQ tablet Take 1 tablet (10 mEq total) by mouth 2 (two) times daily.   PROAIR HFA 108 (90 Base) MCG/ACT inhaler TAKE 2 PUFFS BY MOUTH EVERY 6 HOURS AS NEEDED FOR WHEEZE OR SHORTNESS OF BREATH   rOPINIRole (REQUIP) 0.5 MG tablet 1 TAB AT BEDTIME FOR RESTLESS LEG   simvastatin (ZOCOR) 40 MG tablet Take 1 tablet (40 mg total) by mouth at bedtime.   No facility-administered encounter medications on file as of 10/16/2020.   I spoke with the patient and discussed medication inherence. She states that she is doing well. She has done the suggestions from her primary care doctor. According to her last lab results. She will have her hemoglobin A1C rechecked at her next follow-up visit. She began taking B12 over-the-counter 1000 MCG daily. She continues to follow up with cardiology and pulmonology. She denies any side effects from her current medications. She continues to take her medications as prescribed. No emergency department or urgent care visits since her last PCP or CCP visit. A new CCM appointment was made for July 19th at 9:00 AM.   Star Rating Drugs:  Dispensed Quantity Pharmacy  Metformin 500 mg  [facility]11.2022 [facility]  Simvastatin 40 mg [facility]06.2022 [facility]   Amilia (Mimi) Mare Ferrari, [place] Pharmacist Assistant [phone]

## 2020-10-30 ENCOUNTER — Telehealth: Payer: Self-pay | Admitting: Pulmonary Disease

## 2020-10-31 ENCOUNTER — Other Ambulatory Visit: Payer: Self-pay | Admitting: Internal Medicine

## 2020-10-31 NOTE — Telephone Encounter (Signed)
lmtcb for pt.

## 2020-11-01 ENCOUNTER — Other Ambulatory Visit: Payer: Self-pay

## 2020-11-01 ENCOUNTER — Other Ambulatory Visit: Payer: Self-pay | Admitting: Pulmonary Disease

## 2020-11-01 NOTE — Progress Notes (Signed)
erro  neous encounter

## 2020-11-01 NOTE — Telephone Encounter (Signed)
Patient was ordered and scheduled for pulmonary rehab 11/20/20.  Patient would prefer to have physical therapy at home vs going out.  Patient and her daughter Rebecca Mcintyre are wanting to know if physical therapy would be covered by insurance?  If so they would like to see if Dr.Alva would order home PT or if Dr. [name] was aware of any private service family could hire for physical therapy?  PCC's do you know if insurance will cover home PT?

## 2020-11-01 NOTE — Telephone Encounter (Signed)
I don't see that an order has been placed for PT of any kind

## 2020-11-01 NOTE — Telephone Encounter (Signed)
Called and spoke to pt's daughter, [name]. She is questioning if pts insurance would cover home PT. [name] and pt would prefer home PT over pulmonary rehab if possible.   PCC's are you aware if pt's insurance would cover home PT? Thanks.

## 2020-11-02 MED ORDER — TRELEGY ELLIPTA 100-62.5-25 MCG/INH IN AEPB: 1.0000 | INHALATION_SPRAY | Freq: Every day | RESPIRATORY_TRACT | 3 refills | Status: DC

## 2020-11-03 NOTE — Telephone Encounter (Signed)
Yes, it does cover.  Sometimes it's hard to find a company that is taking new patient's.  Would need order for home PT.

## 2020-11-03 NOTE — Telephone Encounter (Signed)
Dr [name], pt requesting PT at home rather than pulmonary rehab. Would you like [place] to order? Thanks!

## 2020-11-07 ENCOUNTER — Telehealth: Payer: Self-pay | Admitting: Pharmacist

## 2020-11-07 NOTE — Chronic Care Management (AMB) (Signed)
Chronic Care Management Pharmacy Assistant   Name: Rebecca Mcintyre  MRN: [id number] DOB: 1946/01/02  Reason for Encounter: Disease State/ Diabetes Assessment Call.    Conditions to be addressed/monitored: DMII   Recent office visits:  None.   Recent consult visits:  None.   Hospital visits:  None in previous 6 months  Medications: Outpatient Encounter Medications as of 11/07/2020  Medication Sig   acetaminophen (TYLENOL) 500 MG tablet Take 1,000 mg by mouth every 6 (six) hours as needed for mild pain.   albuterol (PROVENTIL) (2.5 MG/3ML) 0.083% nebulizer solution Take 3 mLs (2.5 mg total) by nebulization every 6 (six) hours as needed for wheezing or shortness of breath.   allopurinol (ZYLOPRIM) 100 MG tablet TAKE 1 TABLET BY MOUTH TWICE A DAY   aspirin 81 MG tablet Take 81 mg by mouth daily.   dapagliflozin propanediol (FARXIGA) 10 MG TABS tablet Take 1 tablet (10 mg total) by mouth daily before breakfast.   Fluticasone-Umeclidin-Vilant (TRELEGY ELLIPTA) 100-62.5-25 MCG/INH AEPB Inhale 1 puff into the lungs daily.   furosemide (LASIX) 40 MG tablet Take 1 tablet (40 mg total) by mouth daily.   metFORMIN (GLUCOPHAGE) 500 MG tablet Take 1 tablet (500 mg total) by mouth 2 (two) times daily with a meal. Increase to 2 twice a day with a meal.   metoprolol tartrate (LOPRESSOR) 25 MG tablet TAKE 1/2 TABLETS BY MOUTH 2 TIMES DAILY   montelukast (SINGULAIR) 10 MG tablet TAKE 1 TABLET (10 MG TOTAL) AT BEDTIME BY MOUTH.   nitroGLYCERIN (NITROSTAT) 0.4 MG SL tablet PLACE 1 TABLET UNDER THE TONGUE EVERY 5 MINUTES AS NEEDED.   potassium chloride (KLOR-CON) 10 MEQ tablet Take 1 tablet (10 mEq total) by mouth 2 (two) times daily.   PROAIR HFA 108 (90 Base) MCG/ACT inhaler TAKE 2 PUFFS BY MOUTH EVERY 6 HOURS AS NEEDED FOR WHEEZE OR SHORTNESS OF BREATH   rOPINIRole (REQUIP) 0.5 MG tablet 1 TAB AT BEDTIME FOR RESTLESS LEG   simvastatin (ZOCOR) 40 MG tablet Take 1 tablet (40 mg total) by mouth at  bedtime.   No facility-administered encounter medications on file as of 11/07/2020.   Recent Relevant Labs: Lab Results  Component Value Date/Time   HGBA1C 7.4 (H) 10/03/2020 09:42 AM   HGBA1C 10.1 (H) 03/27/2020 08:06 AM   MICROALBUR 2.8 (H) 03/27/2020 08:06 AM   MICROALBUR 0.7 04/02/2019 09:05 AM    Kidney Function Lab Results  Component Value Date/Time   CREATININE 0.80 10/03/2020 09:42 AM   CREATININE 0.78 03/27/2020 08:06 AM   GFR 72.80 10/03/2020 09:42 AM   GFRNONAA >60 11/12/2018 09:54 AM   GFRAA >60 11/12/2018 09:54 AM    Current antihyperglycemic regimen:  Dapagliflozin 51m - take once daily.  Metformin 5026m- take 1 tablet twice daily.  What recent interventions/DTPs have been made to improve glycemic control:  None.  Have there been any recent hospitalizations or ED visits since last visit with CPP? No Patient denies hypoglycemic symptoms, including None Patient reports hyperglycemic symptoms, including excessive thirst How often are you checking your blood sugar? once daily What are your blood sugars ranging?  Fasting: ranges from 112-149.  Before meals: None to report After meals: None to report Bedtime: None to report During the week, how often does your blood glucose drop below 70? Never Are you checking your feet daily/regularly? Yes. Checks them everyday.   Adherence Review: Is the patient currently on a STATIN medication? Yes Is the patient currently on ACE/ARB medication?  No Does the patient have >5 day gap between last estimated fill dates? No  Notes: Spoke with patient and reviewed medications as listed. Patient reports taking all medications as prescribed except she is no longer taking Montelukast and reports no issues at this time with medications. Patient stated she does check her blood sugar every morning fasting and writes them down but patient did not have her log with her so she gave me the range it runs. Patient states for breakfast in the  mornings she typically has stuff like granola with some fruit or boiled eggs or cereal with some milk or coffee and she likes to have orange juice. This morning she had some cottage cheese and peaches with some coffee. Patient states that 6 out of 7 days a week she has [place] food like a salad with some unsweet tea or water. Patient stated she typically drinks around 7 bottles of water day. Patient states that she usually cooks dinner herself and it is usually chicken or fish with a vegetable or starch. Last night patient had salmon and some cucumbers and tomatoes sliced in some vinegar with a diet coke. Patient loves her diet cokes and has one a day. 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 Patient thanked me for my call.   Star Rating Drugs:  Dapagliflozin 55m - [facility]  Metformin 5036m- [facility] 4079[facility] ([phone]

## 2020-11-08 NOTE — Telephone Encounter (Signed)
Called patient's daughter but she did not answer. Left message for her to call us back.

## 2020-11-09 NOTE — Telephone Encounter (Cosign Needed)
Call back after 3pm.

## 2020-11-11 DIAGNOSIS — I5033 Acute on chronic diastolic (congestive) heart failure: Secondary | ICD-10-CM | POA: Diagnosis not present

## 2020-11-11 DIAGNOSIS — J449 Chronic obstructive pulmonary disease, unspecified: Secondary | ICD-10-CM | POA: Diagnosis not present

## 2020-11-13 ENCOUNTER — Telehealth: Payer: Self-pay | Admitting: Pulmonary Disease

## 2020-11-14 NOTE — Telephone Encounter (Signed)
Called and spoke to pt's daughter, Rebecca Mcintyre. She states instead of home PT she is questioning if pt could have home pulmonary rehab. She states she might've misspoke before saying 'home PT' but states she meant home pulmonary rehab. [name] states the pt is much weaker than when she attend pulmonary rehab the first time and was just curious if home pulmonary rehab is an option. (See phone note from 10/30/2020 for more info).   Dr. [name], please advise if pt could do home pulmonary rehab.

## 2020-11-14 NOTE — Telephone Encounter (Signed)
See phone note from 11/13/2020. Will close encounter.

## 2020-11-15 ENCOUNTER — Telehealth (HOSPITAL_COMMUNITY): Payer: Self-pay

## 2020-11-15 NOTE — Telephone Encounter (Signed)
Pt called and stated that she is going out of town and that she needed to cancel her pulmonary rehab I advised pt to call back once she gets back from out of town and is ready to schedule for pulmonary rehab. Pt understood.

## 2020-11-20 ENCOUNTER — Ambulatory Visit (HOSPITAL_COMMUNITY): Payer: Medicare Other

## 2020-11-24 ENCOUNTER — Other Ambulatory Visit: Payer: Self-pay

## 2020-11-24 ENCOUNTER — Emergency Department (HOSPITAL_COMMUNITY)
Admission: EM | Admit: 2020-11-24 | Discharge: 2020-11-24 | Disposition: A | Discharge status: Home / Self Care | Payer: Medicare Other | Attending: Emergency Medicine | Admitting: Emergency Medicine

## 2020-11-24 ENCOUNTER — Emergency Department (HOSPITAL_COMMUNITY): Payer: Medicare Other

## 2020-11-24 ENCOUNTER — Encounter (HOSPITAL_COMMUNITY): Payer: Self-pay | Admitting: Emergency Medicine

## 2020-11-24 DIAGNOSIS — Y92410 Unspecified street and highway as the place of occurrence of the external cause: Secondary | ICD-10-CM | POA: Insufficient documentation

## 2020-11-24 DIAGNOSIS — J449 Chronic obstructive pulmonary disease, unspecified: Secondary | ICD-10-CM | POA: Insufficient documentation

## 2020-11-24 DIAGNOSIS — Z79899 Other long term (current) drug therapy: Secondary | ICD-10-CM | POA: Diagnosis not present

## 2020-11-24 DIAGNOSIS — R600 Localized edema: Secondary | ICD-10-CM | POA: Diagnosis not present

## 2020-11-24 DIAGNOSIS — I2510 Atherosclerotic heart disease of native coronary artery without angina pectoris: Secondary | ICD-10-CM | POA: Insufficient documentation

## 2020-11-24 DIAGNOSIS — Z743 Need for continuous supervision: Secondary | ICD-10-CM | POA: Diagnosis not present

## 2020-11-24 DIAGNOSIS — S80212A Abrasion, left knee, initial encounter: Secondary | ICD-10-CM | POA: Diagnosis not present

## 2020-11-24 DIAGNOSIS — S90811A Abrasion, right foot, initial encounter: Secondary | ICD-10-CM | POA: Insufficient documentation

## 2020-11-24 DIAGNOSIS — Z7984 Long term (current) use of oral hypoglycemic drugs: Secondary | ICD-10-CM | POA: Insufficient documentation

## 2020-11-24 DIAGNOSIS — I110 Hypertensive heart disease with heart failure: Secondary | ICD-10-CM | POA: Diagnosis not present

## 2020-11-24 DIAGNOSIS — R0902 Hypoxemia: Secondary | ICD-10-CM | POA: Diagnosis not present

## 2020-11-24 DIAGNOSIS — Z955 Presence of coronary angioplasty implant and graft: Secondary | ICD-10-CM | POA: Insufficient documentation

## 2020-11-24 DIAGNOSIS — E785 Hyperlipidemia, unspecified: Secondary | ICD-10-CM | POA: Diagnosis not present

## 2020-11-24 DIAGNOSIS — Z7982 Long term (current) use of aspirin: Secondary | ICD-10-CM | POA: Insufficient documentation

## 2020-11-24 DIAGNOSIS — I5033 Acute on chronic diastolic (congestive) heart failure: Secondary | ICD-10-CM | POA: Diagnosis not present

## 2020-11-24 DIAGNOSIS — S40811A Abrasion of right upper arm, initial encounter: Secondary | ICD-10-CM | POA: Diagnosis not present

## 2020-11-24 DIAGNOSIS — M79621 Pain in right upper arm: Secondary | ICD-10-CM | POA: Diagnosis not present

## 2020-11-24 DIAGNOSIS — T148XXA Other injury of unspecified body region, initial encounter: Secondary | ICD-10-CM

## 2020-11-24 DIAGNOSIS — S99921A Unspecified injury of right foot, initial encounter: Secondary | ICD-10-CM | POA: Diagnosis present

## 2020-11-24 DIAGNOSIS — M25519 Pain in unspecified shoulder: Secondary | ICD-10-CM | POA: Diagnosis not present

## 2020-11-24 DIAGNOSIS — E1169 Type 2 diabetes mellitus with other specified complication: Secondary | ICD-10-CM | POA: Insufficient documentation

## 2020-11-24 DIAGNOSIS — Z87891 Personal history of nicotine dependence: Secondary | ICD-10-CM | POA: Insufficient documentation

## 2020-11-24 DIAGNOSIS — R58 Hemorrhage, not elsewhere classified: Secondary | ICD-10-CM | POA: Diagnosis not present

## 2020-11-24 DIAGNOSIS — I499 Cardiac arrhythmia, unspecified: Secondary | ICD-10-CM | POA: Diagnosis not present

## 2020-11-24 DIAGNOSIS — S80211A Abrasion, right knee, initial encounter: Secondary | ICD-10-CM | POA: Insufficient documentation

## 2020-11-24 MED ORDER — OXYCODONE-ACETAMINOPHEN 5-325 MG PO TABS
1.0000 | ORAL_TABLET | Freq: Once | ORAL | Status: AC
Start: 2020-11-24 — End: 2020-11-24
  Administered 2020-11-24: 1 via ORAL
  Filled 2020-11-24: qty 1

## 2020-11-24 MED ORDER — OXYCODONE-ACETAMINOPHEN 5-325 MG PO TABS: 1.0000 | ORAL_TABLET | Freq: Four times a day (QID) | ORAL | 0 refills | Status: AC | PRN

## 2020-11-24 MED ORDER — OXYCODONE-ACETAMINOPHEN 5-325 MG PO TABS
1.0000 | ORAL_TABLET | ORAL | Status: DC | PRN
  Administered 2020-11-24: 1 via ORAL
  Filled 2020-11-24: qty 1

## 2020-11-24 NOTE — ED Notes (Signed)
Pt wound irrigated and dressing changed, pt educated on home wound care
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
## 2020-11-24 NOTE — ED Notes (Signed)
Patient is off the floor for a scan. Will update vitals when patient is back on the floor.
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
## 2020-11-24 NOTE — ED Notes (Signed)
Pt unable to sign for d/c due to comm pad failure

## 2020-11-24 NOTE — Discharge Instructions (Addendum)
Change dressing every day.  Use petroleum gauze or antibiotic ointment as discussed.  Please call Ortho office on Monday morning to request close follow-up appointment.  If you develop significant swelling, severe or uncontrolled pain, or other new concerns symptom, come back to ER for reassessment.  Take pain medicine as needed, recommend Tylenol Motrin.  For breakthrough pain take Percocet.  Note this can make you drowsy and should not be taken while driving.

## 2020-11-28 ENCOUNTER — Ambulatory Visit (HOSPITAL_COMMUNITY): Payer: Medicare Other

## 2020-11-28 ENCOUNTER — Encounter (HOSPITAL_COMMUNITY): Payer: Self-pay | Admitting: Family Medicine

## 2020-11-28 ENCOUNTER — Emergency Department (HOSPITAL_COMMUNITY): Payer: Medicare Other

## 2020-11-28 ENCOUNTER — Inpatient Hospital Stay (HOSPITAL_COMMUNITY)
Admission: EM | Admit: 2020-11-28 | Discharge: 2020-12-01 | DRG: 291 | Disposition: A | Discharge status: Home Health | Payer: Medicare Other | Attending: Internal Medicine | Admitting: Internal Medicine

## 2020-11-28 DIAGNOSIS — I671 Cerebral aneurysm, nonruptured: Secondary | ICD-10-CM | POA: Diagnosis present

## 2020-11-28 DIAGNOSIS — I110 Hypertensive heart disease with heart failure: Principal | ICD-10-CM | POA: Diagnosis present

## 2020-11-28 DIAGNOSIS — R778 Other specified abnormalities of plasma proteins: Secondary | ICD-10-CM | POA: Diagnosis not present

## 2020-11-28 DIAGNOSIS — R410 Disorientation, unspecified: Secondary | ICD-10-CM | POA: Diagnosis not present

## 2020-11-28 DIAGNOSIS — Z9111 Patient's noncompliance with dietary regimen: Secondary | ICD-10-CM

## 2020-11-28 DIAGNOSIS — J9621 Acute and chronic respiratory failure with hypoxia: Secondary | ICD-10-CM | POA: Diagnosis not present

## 2020-11-28 DIAGNOSIS — S91301A Unspecified open wound, right foot, initial encounter: Secondary | ICD-10-CM

## 2020-11-28 DIAGNOSIS — I248 Other forms of acute ischemic heart disease: Secondary | ICD-10-CM | POA: Diagnosis present

## 2020-11-28 DIAGNOSIS — S9031XA Contusion of right foot, initial encounter: Secondary | ICD-10-CM | POA: Insufficient documentation

## 2020-11-28 DIAGNOSIS — I5033 Acute on chronic diastolic (congestive) heart failure: Secondary | ICD-10-CM | POA: Diagnosis present

## 2020-11-28 DIAGNOSIS — R7989 Other specified abnormal findings of blood chemistry: Secondary | ICD-10-CM | POA: Diagnosis present

## 2020-11-28 DIAGNOSIS — I2510 Atherosclerotic heart disease of native coronary artery without angina pectoris: Secondary | ICD-10-CM | POA: Diagnosis present

## 2020-11-28 DIAGNOSIS — J9612 Chronic respiratory failure with hypercapnia: Secondary | ICD-10-CM | POA: Diagnosis not present

## 2020-11-28 DIAGNOSIS — Z91128 Patient's intentional underdosing of medication regimen for other reason: Secondary | ICD-10-CM

## 2020-11-28 DIAGNOSIS — I517 Cardiomegaly: Secondary | ICD-10-CM | POA: Diagnosis not present

## 2020-11-28 DIAGNOSIS — J9622 Acute and chronic respiratory failure with hypercapnia: Secondary | ICD-10-CM | POA: Diagnosis not present

## 2020-11-28 DIAGNOSIS — M25511 Pain in right shoulder: Secondary | ICD-10-CM | POA: Diagnosis present

## 2020-11-28 DIAGNOSIS — E119 Type 2 diabetes mellitus without complications: Secondary | ICD-10-CM | POA: Diagnosis not present

## 2020-11-28 DIAGNOSIS — Z803 Family history of malignant neoplasm of breast: Secondary | ICD-10-CM

## 2020-11-28 DIAGNOSIS — M79661 Pain in right lower leg: Secondary | ICD-10-CM | POA: Diagnosis not present

## 2020-11-28 DIAGNOSIS — J449 Chronic obstructive pulmonary disease, unspecified: Secondary | ICD-10-CM | POA: Diagnosis present

## 2020-11-28 DIAGNOSIS — R600 Localized edema: Secondary | ICD-10-CM | POA: Diagnosis not present

## 2020-11-28 DIAGNOSIS — T40605A Adverse effect of unspecified narcotics, initial encounter: Secondary | ICD-10-CM | POA: Diagnosis present

## 2020-11-28 DIAGNOSIS — Z9981 Dependence on supplemental oxygen: Secondary | ICD-10-CM

## 2020-11-28 DIAGNOSIS — M79621 Pain in right upper arm: Secondary | ICD-10-CM | POA: Diagnosis not present

## 2020-11-28 DIAGNOSIS — Z87891 Personal history of nicotine dependence: Secondary | ICD-10-CM

## 2020-11-28 DIAGNOSIS — Z955 Presence of coronary angioplasty implant and graft: Secondary | ICD-10-CM

## 2020-11-28 DIAGNOSIS — Z8249 Family history of ischemic heart disease and other diseases of the circulatory system: Secondary | ICD-10-CM

## 2020-11-28 DIAGNOSIS — Z6841 Body Mass Index (BMI) 40.0 and over, adult: Secondary | ICD-10-CM | POA: Diagnosis not present

## 2020-11-28 DIAGNOSIS — J9611 Chronic respiratory failure with hypoxia: Secondary | ICD-10-CM | POA: Diagnosis not present

## 2020-11-28 DIAGNOSIS — T501X6A Underdosing of loop [high-ceiling] diuretics, initial encounter: Secondary | ICD-10-CM | POA: Diagnosis not present

## 2020-11-28 DIAGNOSIS — Z20822 Contact with and (suspected) exposure to covid-19: Secondary | ICD-10-CM | POA: Diagnosis not present

## 2020-11-28 DIAGNOSIS — M79604 Pain in right leg: Secondary | ICD-10-CM | POA: Diagnosis present

## 2020-11-28 DIAGNOSIS — E876 Hypokalemia: Secondary | ICD-10-CM | POA: Diagnosis not present

## 2020-11-28 DIAGNOSIS — E041 Nontoxic single thyroid nodule: Secondary | ICD-10-CM | POA: Diagnosis not present

## 2020-11-28 DIAGNOSIS — Z7984 Long term (current) use of oral hypoglycemic drugs: Secondary | ICD-10-CM

## 2020-11-28 DIAGNOSIS — G9340 Encephalopathy, unspecified: Secondary | ICD-10-CM

## 2020-11-28 DIAGNOSIS — S90511A Abrasion, right ankle, initial encounter: Secondary | ICD-10-CM | POA: Diagnosis not present

## 2020-11-28 DIAGNOSIS — I729 Aneurysm of unspecified site: Secondary | ICD-10-CM | POA: Diagnosis not present

## 2020-11-28 DIAGNOSIS — R0602 Shortness of breath: Secondary | ICD-10-CM | POA: Diagnosis not present

## 2020-11-28 DIAGNOSIS — R29810 Facial weakness: Secondary | ICD-10-CM | POA: Diagnosis not present

## 2020-11-28 DIAGNOSIS — Z801 Family history of malignant neoplasm of trachea, bronchus and lung: Secondary | ICD-10-CM

## 2020-11-28 DIAGNOSIS — T502X5A Adverse effect of carbonic-anhydrase inhibitors, benzothiadiazides and other diuretics, initial encounter: Secondary | ICD-10-CM | POA: Diagnosis not present

## 2020-11-28 DIAGNOSIS — Z8673 Personal history of transient ischemic attack (TIA), and cerebral infarction without residual deficits: Secondary | ICD-10-CM | POA: Diagnosis not present

## 2020-11-28 DIAGNOSIS — Z743 Need for continuous supervision: Secondary | ICD-10-CM | POA: Diagnosis not present

## 2020-11-28 DIAGNOSIS — E1165 Type 2 diabetes mellitus with hyperglycemia: Secondary | ICD-10-CM

## 2020-11-28 DIAGNOSIS — I5031 Acute diastolic (congestive) heart failure: Secondary | ICD-10-CM | POA: Diagnosis not present

## 2020-11-28 DIAGNOSIS — J329 Chronic sinusitis, unspecified: Secondary | ICD-10-CM | POA: Diagnosis not present

## 2020-11-28 DIAGNOSIS — I10 Essential (primary) hypertension: Secondary | ICD-10-CM | POA: Diagnosis not present

## 2020-11-28 DIAGNOSIS — Z800 Family history of malignant neoplasm of digestive organs: Secondary | ICD-10-CM

## 2020-11-28 DIAGNOSIS — I509 Heart failure, unspecified: Secondary | ICD-10-CM | POA: Diagnosis not present

## 2020-11-28 DIAGNOSIS — M7731 Calcaneal spur, right foot: Secondary | ICD-10-CM | POA: Diagnosis not present

## 2020-11-28 DIAGNOSIS — J811 Chronic pulmonary edema: Secondary | ICD-10-CM | POA: Diagnosis not present

## 2020-11-28 DIAGNOSIS — G928 Other toxic encephalopathy: Secondary | ICD-10-CM | POA: Diagnosis present

## 2020-11-28 DIAGNOSIS — I6523 Occlusion and stenosis of bilateral carotid arteries: Secondary | ICD-10-CM | POA: Diagnosis not present

## 2020-11-28 DIAGNOSIS — R531 Weakness: Secondary | ICD-10-CM | POA: Diagnosis not present

## 2020-11-28 DIAGNOSIS — R4182 Altered mental status, unspecified: Secondary | ICD-10-CM

## 2020-11-28 DIAGNOSIS — R6889 Other general symptoms and signs: Secondary | ICD-10-CM | POA: Diagnosis not present

## 2020-11-28 LAB — I-STAT CHEM 8, ED
BUN: 22 mg/dL (ref 8–23)
Calcium, Ion: 1.21 mmol/L (ref 1.15–1.40)
Chloride: 99 mmol/L (ref 98–111)
Creatinine, Ser: 0.9 mg/dL (ref 0.44–1.00)
Glucose, Bld: 170 mg/dL — ABNORMAL HIGH (ref 70–99)
HCT: 40 % (ref 36.0–46.0)
Hemoglobin: 13.6 g/dL (ref 12.0–15.0)
Potassium: 4.6 mmol/L (ref 3.5–5.1)
Sodium: 138 mmol/L (ref 135–145)
TCO2: 33 mmol/L — ABNORMAL HIGH (ref 22–32)

## 2020-11-28 LAB — CBC
HCT: 40.6 % (ref 36.0–46.0)
Hemoglobin: 12.1 g/dL (ref 12.0–15.0)
MCH: 29.3 pg (ref 26.0–34.0)
MCHC: 29.8 g/dL — ABNORMAL LOW (ref 30.0–36.0)
MCV: 98.3 fL (ref 80.0–100.0)
Platelets: 248 10*3/uL (ref 150–400)
RBC: 4.13 MIL/uL (ref 3.87–5.11)
RDW: 14.1 % (ref 11.5–15.5)
WBC: 8.5 10*3/uL (ref 4.0–10.5)
nRBC: 0.7 % — ABNORMAL HIGH (ref 0.0–0.2)

## 2020-11-28 LAB — URINALYSIS, ROUTINE W REFLEX MICROSCOPIC
Bilirubin Urine: NEGATIVE
Glucose, UA: >=500 mg/dL — AB
Hgb urine dipstick: NEGATIVE
Ketones, ur: NEGATIVE mg/dL
Nitrite: NEGATIVE
Protein, ur: NEGATIVE mg/dL
Specific Gravity, Urine: >1.046 — ABNORMAL HIGH (ref 1.005–1.030)
pH: 5 (ref 5.0–8.0)

## 2020-11-28 LAB — D-DIMER, QUANTITATIVE: D-Dimer, Quant: 0.93 ug/mL-FEU — ABNORMAL HIGH (ref 0.00–0.50)

## 2020-11-28 LAB — DIFFERENTIAL
Abs Immature Granulocytes: 0.16 10*3/uL — ABNORMAL HIGH (ref 0.00–0.07)
Basophils Absolute: 0.1 10*3/uL (ref 0.0–0.1)
Basophils Relative: 1 %
Eosinophils Absolute: 0 10*3/uL (ref 0.0–0.5)
Eosinophils Relative: 1 %
Immature Granulocytes: 2 %
Lymphocytes Relative: 22 %
Lymphs Abs: 1.9 10*3/uL (ref 0.7–4.0)
Monocytes Absolute: 0.8 10*3/uL (ref 0.1–1.0)
Monocytes Relative: 10 %
Neutro Abs: 5.5 10*3/uL (ref 1.7–7.7)
Neutrophils Relative %: 64 %

## 2020-11-28 LAB — BRAIN NATRIURETIC PEPTIDE: B Natriuretic Peptide: 578.9 pg/mL — ABNORMAL HIGH (ref 0.0–100.0)

## 2020-11-28 LAB — COMPREHENSIVE METABOLIC PANEL
ALT: 25 U/L (ref 0–44)
AST: 28 U/L (ref 15–41)
Albumin: 3.1 g/dL — ABNORMAL LOW (ref 3.5–5.0)
Alkaline Phosphatase: 67 U/L (ref 38–126)
Anion gap: 9 (ref 5–15)
BUN: 18 mg/dL (ref 8–23)
CO2: 31 mmol/L (ref 22–32)
Calcium: 8.9 mg/dL (ref 8.9–10.3)
Chloride: 96 mmol/L — ABNORMAL LOW (ref 98–111)
Creatinine, Ser: 0.9 mg/dL (ref 0.44–1.00)
GFR, Estimated: >60 mL/min (ref >60)
Glucose, Bld: 170 mg/dL — ABNORMAL HIGH (ref 70–99)
Potassium: 4.6 mmol/L (ref 3.5–5.1)
Sodium: 136 mmol/L (ref 135–145)
Total Bilirubin: 0.7 mg/dL (ref 0.3–1.2)
Total Protein: 6.3 g/dL — ABNORMAL LOW (ref 6.5–8.1)

## 2020-11-28 LAB — TROPONIN I (HIGH SENSITIVITY)
Troponin I (High Sensitivity): 563 ng/L (ref <18)
Troponin I (High Sensitivity): 678 ng/L (ref <18)

## 2020-11-28 LAB — RESP PANEL BY RT-PCR (FLU A&B, COVID) ARPGX2
Influenza A by PCR: NEGATIVE
Influenza B by PCR: NEGATIVE
SARS Coronavirus 2 by RT PCR: NEGATIVE

## 2020-11-28 LAB — PROTIME-INR
INR: 1 (ref 0.8–1.2)
Prothrombin Time: 13.3 seconds (ref 11.4–15.2)

## 2020-11-28 LAB — CK: Total CK: 168 U/L (ref 38–234)

## 2020-11-28 LAB — CBG MONITORING, ED: Glucose-Capillary: 161 mg/dL — ABNORMAL HIGH (ref 70–99)

## 2020-11-28 LAB — LACTIC ACID, PLASMA: Lactic Acid, Venous: 1.1 mmol/L (ref 0.5–1.9)

## 2020-11-28 MED ORDER — FUROSEMIDE 10 MG/ML IJ SOLN
40.0000 mg | Freq: Two times a day (BID) | INTRAMUSCULAR | Status: DC
  Administered 2020-11-29 – 2020-12-01 (×5): 40 mg via INTRAVENOUS
  Filled 2020-11-28 (×5): qty 4

## 2020-11-28 MED ORDER — SODIUM CHLORIDE 0.9% FLUSH
3.0000 mL | Freq: Two times a day (BID) | INTRAVENOUS | Status: DC
  Administered 2020-11-28 – 2020-11-30 (×5): 3 mL via INTRAVENOUS

## 2020-11-28 MED ORDER — FLUTICASONE FUROATE-VILANTEROL 100-25 MCG/INH IN AEPB
1.0000 | INHALATION_SPRAY | Freq: Every day | RESPIRATORY_TRACT | Status: DC
  Administered 2020-11-29 – 2020-12-01 (×2): 1 via RESPIRATORY_TRACT
  Filled 2020-11-28 (×2): qty 28

## 2020-11-28 MED ORDER — ASPIRIN EC 81 MG PO TBEC
81.0000 mg | DELAYED_RELEASE_TABLET | Freq: Every day | ORAL | Status: DC
  Administered 2020-11-29 – 2020-12-01 (×3): 81 mg via ORAL
  Filled 2020-11-28 (×3): qty 1

## 2020-11-28 MED ORDER — ROPINIROLE HCL 1 MG PO TABS
0.5000 mg | ORAL_TABLET | Freq: Every day | ORAL | Status: DC
  Administered 2020-11-28 – 2020-11-30 (×3): 0.5 mg via ORAL
  Filled 2020-11-28 (×4): qty 1

## 2020-11-28 MED ORDER — OXYCODONE HCL 5 MG PO TABS
5.0000 mg | ORAL_TABLET | Freq: Once | ORAL | Status: AC | PRN
  Administered 2020-11-28: 5 mg via ORAL
  Filled 2020-11-28: qty 1

## 2020-11-28 MED ORDER — SODIUM CHLORIDE 0.9 % IV SOLN: 250.0000 mL | INTRAVENOUS | Status: DC | PRN

## 2020-11-28 MED ORDER — ALBUTEROL SULFATE (2.5 MG/3ML) 0.083% IN NEBU: 2.5000 mg | INHALATION_SOLUTION | Freq: Four times a day (QID) | RESPIRATORY_TRACT | Status: DC | PRN

## 2020-11-28 MED ORDER — FUROSEMIDE 10 MG/ML IJ SOLN
40.0000 mg | Freq: Once | INTRAMUSCULAR | Status: AC
  Administered 2020-11-28: 40 mg via INTRAVENOUS
  Filled 2020-11-28: qty 4

## 2020-11-28 MED ORDER — METOPROLOL TARTRATE 12.5 MG HALF TABLET
12.5000 mg | ORAL_TABLET | Freq: Two times a day (BID) | ORAL | Status: DC
  Administered 2020-11-28 – 2020-12-01 (×6): 12.5 mg via ORAL
  Filled 2020-11-28 (×6): qty 1

## 2020-11-28 MED ORDER — ALLOPURINOL 100 MG PO TABS
100.0000 mg | ORAL_TABLET | Freq: Two times a day (BID) | ORAL | Status: DC
  Administered 2020-11-29 – 2020-12-01 (×5): 100 mg via ORAL
  Filled 2020-11-28 (×5): qty 1

## 2020-11-28 MED ORDER — ONDANSETRON HCL 4 MG/2ML IJ SOLN: 4.0000 mg | Freq: Four times a day (QID) | INTRAMUSCULAR | Status: DC | PRN

## 2020-11-28 MED ORDER — OXYCODONE HCL 5 MG PO TABS
5.0000 mg | ORAL_TABLET | Freq: Four times a day (QID) | ORAL | Status: DC | PRN
  Administered 2020-11-29 – 2020-12-01 (×7): 5 mg via ORAL
  Filled 2020-11-28 (×8): qty 1

## 2020-11-28 MED ORDER — ACETAMINOPHEN 325 MG PO TABS
650.0000 mg | ORAL_TABLET | Freq: Four times a day (QID) | ORAL | Status: DC
  Administered 2020-11-28 – 2020-11-29 (×2): 650 mg via ORAL
  Filled 2020-11-28 (×2): qty 2

## 2020-11-28 MED ORDER — SIMVASTATIN 20 MG PO TABS
40.0000 mg | ORAL_TABLET | Freq: Every day | ORAL | Status: DC
  Administered 2020-11-29 – 2020-11-30 (×2): 40 mg via ORAL
  Filled 2020-11-28 (×2): qty 2

## 2020-11-28 MED ORDER — FLUTICASONE-UMECLIDIN-VILANT 100-62.5-25 MCG/INH IN AEPB: 1.0000 | INHALATION_SPRAY | Freq: Every day | RESPIRATORY_TRACT | Status: DC

## 2020-11-28 MED ORDER — UMECLIDINIUM BROMIDE 62.5 MCG/INH IN AEPB
1.0000 | INHALATION_SPRAY | Freq: Every day | RESPIRATORY_TRACT | Status: DC
  Administered 2020-11-29 – 2020-12-01 (×2): 1 via RESPIRATORY_TRACT
  Filled 2020-11-28 (×2): qty 7

## 2020-11-28 MED ORDER — INSULIN ASPART 100 UNIT/ML IJ SOLN
0.0000 [IU] | Freq: Three times a day (TID) | INTRAMUSCULAR | Status: DC
  Administered 2020-11-29 (×2): 1 [IU] via SUBCUTANEOUS
  Administered 2020-11-30 (×2): 2 [IU] via SUBCUTANEOUS
  Administered 2020-12-01: 1 [IU] via SUBCUTANEOUS

## 2020-11-28 MED ORDER — IOHEXOL 350 MG/ML SOLN
75.0000 mL | Freq: Once | INTRAVENOUS | Status: AC | PRN
  Administered 2020-11-28: 75 mL via INTRAVENOUS

## 2020-11-28 MED ORDER — SODIUM CHLORIDE 0.9% FLUSH: 3.0000 mL | INTRAVENOUS | Status: DC | PRN

## 2020-11-28 MED ORDER — FUROSEMIDE 10 MG/ML IJ SOLN: 40.0000 mg | Freq: Two times a day (BID) | INTRAMUSCULAR | Status: DC

## 2020-11-28 NOTE — ED Notes (Signed)
Lab to add on Ddimer

## 2020-11-28 NOTE — ED Notes (Signed)
Pt denies CP at this time
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
## 2020-11-28 NOTE — ED Provider Notes (Signed)
Pt signed out from Dr. Roslynn Amble pending labs/x-rays/CT scans.   IMPRESSION:  CT head:     1. No evidence of acute intracranial abnormality.  2. Low-lying cerebellar tonsils. The cerebellar tonsils may extend  up to 5 mm below the foramen magnum. Consider a brain MRI for  further evaluation and to better assess for for a possible Chiari 1  malformation.  3. Paranasal sinus disease, as described.     CTA neck:     1. The bilateral common and internal carotid arteries are patent  within the neck without hemodynamically significant stenosis (50% or  greater). Atherosclerotic plaque within these vessels as described.  2. Vertebral arteries patent within the neck. Moderate/severe  atherosclerotic stenosis at the origin of the dominant right  vertebral artery. Moderate atherosclerotic stenosis at the origin of  the non dominant left vertebral artery.  3. Interlobular septal thickening within the imaged lung apices  suggesting interstitial edema.  4. 2.1 cm right thyroid lobe nodule. A dedicated thyroid ultrasound  is recommended for further evaluation.     CTA head:     1. No intracranial large vessel occlusion or proximal high-grade  stenosis.  2. Calcified plaque within the intracranial internal carotid  arteries bilaterally with no more than mild stenosis.  3. 5 x 3 mm anterior communicating artery saccular aneurysm.  Neuro-interventional consultation is recommended.   Daughter told of the aneurysm and is aware it is very important to get that followed up.  CXR with vascular congestion.  She has not been taking her lasix as it hurts to walk and she did not want to go to the bathroom. Tib/fib/foot/humerus xray neg.  Troponin is elevated at 563.  No CP.  EKG without changes.  No CP.  Pt d/w cardiology.  Nothing acute to do from their stand point.  UA neg.  AMS could be from pain meds?  MRI pending.  Pt d/w Dr. Myna Hidalgo (triad) for admission.     Isla Pence, MD 11/28/20  2045

## 2020-11-28 NOTE — Telephone Encounter (Signed)
I spoke with the pt's daughter and a virtual visit was schedule for 07/07. Pt's daughter stated she would send in pictures as soon as possible.

## 2020-11-28 NOTE — ED Notes (Signed)
family at bedside speaking with EDP
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
## 2020-11-28 NOTE — ED Notes (Signed)
EDP notified about critical value

## 2020-11-28 NOTE — Telephone Encounter (Signed)
Left a message for the pt to return my call.

## 2020-11-28 NOTE — ED Notes (Signed)
Patient transported to X-ray 

## 2020-11-28 NOTE — ED Notes (Signed)
ED Provider at bedside. 

## 2020-11-28 NOTE — Telephone Encounter (Signed)
Advise a follow up.  Can you send in pictures ?  Could do tele visit  on Thursday ( has to be visual) Or  add on  430 pm tomorrow  in person .
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
## 2020-11-28 NOTE — ED Notes (Signed)
Dr. Myna Hidalgo at bedside

## 2020-11-28 NOTE — ED Notes (Signed)
MRI

## 2020-11-28 NOTE — ED Notes (Signed)
Pt taken to MRI

## 2020-11-29 ENCOUNTER — Inpatient Hospital Stay (HOSPITAL_COMMUNITY): Payer: Medicare Other

## 2020-11-29 ENCOUNTER — Other Ambulatory Visit: Payer: Self-pay

## 2020-11-29 DIAGNOSIS — I5031 Acute diastolic (congestive) heart failure: Secondary | ICD-10-CM

## 2020-11-29 LAB — AMMONIA: Ammonia: 23 umol/L (ref 9–35)

## 2020-11-29 LAB — GLUCOSE, CAPILLARY
Glucose-Capillary: 131 mg/dL — ABNORMAL HIGH (ref 70–99)
Glucose-Capillary: 166 mg/dL — ABNORMAL HIGH (ref 70–99)

## 2020-11-29 LAB — TSH: TSH: 3.354 u[IU]/mL (ref 0.350–4.500)

## 2020-11-29 LAB — BASIC METABOLIC PANEL
Anion gap: 8 (ref 5–15)
BUN: 15 mg/dL (ref 8–23)
CO2: 32 mmol/L (ref 22–32)
Calcium: 8.7 mg/dL — ABNORMAL LOW (ref 8.9–10.3)
Chloride: 92 mmol/L — ABNORMAL LOW (ref 98–111)
Creatinine, Ser: 0.88 mg/dL (ref 0.44–1.00)
GFR, Estimated: >60 mL/min (ref >60)
Glucose, Bld: 136 mg/dL — ABNORMAL HIGH (ref 70–99)
Potassium: 3.9 mmol/L (ref 3.5–5.1)
Sodium: 132 mmol/L — ABNORMAL LOW (ref 135–145)

## 2020-11-29 LAB — VITAMIN B12: Vitamin B-12: 696 pg/mL (ref 180–914)

## 2020-11-29 LAB — CBG MONITORING, ED
Glucose-Capillary: 129 mg/dL — ABNORMAL HIGH (ref 70–99)
Glucose-Capillary: 110 mg/dL — ABNORMAL HIGH (ref 70–99)

## 2020-11-29 LAB — ECHOCARDIOGRAM COMPLETE
Area-P 1/2: 2.95 cm2
Height: 63 in
S' Lateral: 2.6 cm
Weight: 4000 oz

## 2020-11-29 LAB — RPR: RPR Ser Ql: NONREACTIVE

## 2020-11-29 LAB — TROPONIN I (HIGH SENSITIVITY)
Troponin I (High Sensitivity): 769 ng/L (ref <18)
Troponin I (High Sensitivity): 365 ng/L (ref <18)

## 2020-11-29 LAB — HIV ANTIBODY (ROUTINE TESTING W REFLEX): HIV Screen 4th Generation wRfx: NONREACTIVE

## 2020-11-29 MED ORDER — ENOXAPARIN SODIUM 40 MG/0.4ML IJ SOSY
40.0000 mg | PREFILLED_SYRINGE | INTRAMUSCULAR | Status: DC
  Administered 2020-11-29 – 2020-11-30 (×2): 40 mg via SUBCUTANEOUS
  Filled 2020-11-29 (×2): qty 0.4

## 2020-11-29 MED ORDER — PERFLUTREN LIPID MICROSPHERE
1.0000 mL | INTRAVENOUS | Status: AC | PRN
  Administered 2020-11-29: 2 mL via INTRAVENOUS
  Filled 2020-11-29: qty 10

## 2020-11-29 MED ORDER — ACETAMINOPHEN 500 MG PO TABS
1000.0000 mg | ORAL_TABLET | Freq: Three times a day (TID) | ORAL | Status: DC
  Administered 2020-11-29 – 2020-12-01 (×6): 1000 mg via ORAL
  Filled 2020-11-29 (×7): qty 2

## 2020-11-29 NOTE — Plan of Care (Signed)
  Problem: Education: Goal: Knowledge of General Education information will improve Description: Including pain rating scale, medication(s)/side effects and non-pharmacologic comfort measures Outcome: Progressing   Problem: Health Behavior/Discharge Planning: Goal: Ability to manage health-related needs will improve Outcome: Progressing   Problem: Clinical Measurements: Goal: Ability to maintain clinical measurements within normal limits will improve Outcome: Progressing Goal: Will remain free from infection Outcome: Progressing Goal: Diagnostic test results will improve Outcome: Progressing Goal: Respiratory complications will improve Outcome: Progressing Goal: Cardiovascular complication will be avoided Outcome: Progressing   Problem: Activity: Goal: Risk for activity intolerance will decrease Outcome: Progressing   Problem: Nutrition: Goal: Adequate nutrition will be maintained Outcome: Progressing   Problem: Coping: Goal: Level of anxiety will decrease Outcome: Progressing   Problem: Elimination: Goal: Will not experience complications related to bowel motility Outcome: Progressing Goal: Will not experience complications related to urinary retention Outcome: Progressing   Problem: Pain Managment: Goal: General experience of comfort will improve Outcome: Progressing   Problem: Safety: Goal: Ability to remain free from injury will improve Outcome: Progressing   Problem: Skin Integrity: Goal: Risk for impaired skin integrity will decrease Outcome: Progressing   Problem: Education: Goal: Knowledge of disease or condition will improve Outcome: Progressing Goal: Knowledge of the prescribed therapeutic regimen will improve Outcome: Progressing Goal: Individualized Educational Video(s) Outcome: Progressing   Problem: Activity: Goal: Ability to tolerate increased activity will improve Outcome: Progressing Goal: Will verbalize the importance of balancing  activity with adequate rest periods Outcome: Progressing   Problem: Respiratory: Goal: Ability to maintain a clear airway will improve Outcome: Progressing Goal: Levels of oxygenation will improve Outcome: Progressing Goal: Ability to maintain adequate ventilation will improve Outcome: Progressing   Problem: Education: Goal: Knowledge of disease or condition will improve Outcome: Progressing Goal: Knowledge of secondary prevention will improve Outcome: Progressing Goal: Knowledge of patient specific risk factors addressed and post discharge goals established will improve Outcome: Progressing Goal: Individualized Educational Video(s) Outcome: Progressing

## 2020-11-29 NOTE — Progress Notes (Signed)
  Echocardiogram 2D Echocardiogram has been performed.  Rebecca Mcintyre 11/29/2020, 1:58 PM
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
## 2020-11-29 NOTE — Telephone Encounter (Signed)
Called and spoke with patient's daughter [name]. She stated that she is interested in the home pulmonary rehab program. I advised her that I would let RA know.   Nothing further needed at time of call.

## 2020-11-30 ENCOUNTER — Ambulatory Visit (HOSPITAL_COMMUNITY): Payer: Medicare Other

## 2020-11-30 ENCOUNTER — Telehealth: Payer: Medicare Other | Admitting: Internal Medicine

## 2020-11-30 LAB — BASIC METABOLIC PANEL
Anion gap: 11 (ref 5–15)
BUN: 20 mg/dL (ref 8–23)
CO2: 35 mmol/L — ABNORMAL HIGH (ref 22–32)
Calcium: 9.1 mg/dL (ref 8.9–10.3)
Chloride: 90 mmol/L — ABNORMAL LOW (ref 98–111)
Creatinine, Ser: 0.98 mg/dL (ref 0.44–1.00)
GFR, Estimated: >60 mL/min (ref >60)
Glucose, Bld: 143 mg/dL — ABNORMAL HIGH (ref 70–99)
Potassium: 3.2 mmol/L — ABNORMAL LOW (ref 3.5–5.1)
Sodium: 136 mmol/L (ref 135–145)

## 2020-11-30 LAB — GLUCOSE, CAPILLARY
Glucose-Capillary: 174 mg/dL — ABNORMAL HIGH (ref 70–99)
Glucose-Capillary: 162 mg/dL — ABNORMAL HIGH (ref 70–99)
Glucose-Capillary: 104 mg/dL — ABNORMAL HIGH (ref 70–99)
Glucose-Capillary: 127 mg/dL — ABNORMAL HIGH (ref 70–99)

## 2020-11-30 MED ORDER — BISACODYL 10 MG RE SUPP: 10.0000 mg | Freq: Every day | RECTAL | Status: DC | PRN

## 2020-11-30 MED ORDER — SENNOSIDES-DOCUSATE SODIUM 8.6-50 MG PO TABS
2.0000 | ORAL_TABLET | Freq: Every day | ORAL | Status: DC
  Administered 2020-11-30: 2 via ORAL
  Filled 2020-11-30: qty 2

## 2020-11-30 MED ORDER — POLYETHYLENE GLYCOL 3350 17 G PO PACK
17.0000 g | PACK | Freq: Every day | ORAL | Status: DC
  Administered 2020-11-30 – 2020-12-01 (×2): 17 g via ORAL
  Filled 2020-11-30: qty 1

## 2020-11-30 MED ORDER — POTASSIUM CHLORIDE 20 MEQ PO PACK
40.0000 meq | PACK | Freq: Four times a day (QID) | ORAL | Status: AC
  Administered 2020-11-30 (×2): 40 meq via ORAL
  Filled 2020-11-30 (×2): qty 2
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
## 2020-11-30 NOTE — Plan of Care (Signed)
Problem: Education: Goal: Knowledge of General Education information will improve Description: Including pain rating scale, medication(s)/side effects and non-pharmacologic comfort measures 11/30/2020 0236 by Allayne Stack, RN Outcome: Progressing 11/30/2020 0236 by Allayne Stack, RN Outcome: Progressing   Problem: Health Behavior/Discharge Planning: Goal: Ability to manage health-related needs will improve 11/30/2020 0236 by Allayne Stack, RN Outcome: Progressing 11/30/2020 0236 by Allayne Stack, RN Outcome: Progressing   Problem: Clinical Measurements: Goal: Ability to maintain clinical measurements within normal limits will improve 11/30/2020 0236 by Allayne Stack, RN Outcome: Progressing 11/30/2020 0236 by Allayne Stack, RN Outcome: Progressing Goal: Will remain free from infection 11/30/2020 0236 by Allayne Stack, RN Outcome: Progressing 11/30/2020 0236 by Allayne Stack, RN Outcome: Progressing Goal: Diagnostic test results will improve 11/30/2020 0236 by Allayne Stack, RN Outcome: Progressing 11/30/2020 0236 by Allayne Stack, RN Outcome: Progressing Goal: Respiratory complications will improve 11/30/2020 0236 by Allayne Stack, RN Outcome: Progressing 11/30/2020 0236 by Allayne Stack, RN Outcome: Progressing Goal: Cardiovascular complication will be avoided 11/30/2020 0236 by Allayne Stack, RN Outcome: Progressing 11/30/2020 0236 by Allayne Stack, RN Outcome: Progressing   Problem: Activity: Goal: Risk for activity intolerance will decrease 11/30/2020 0236 by Allayne Stack, RN Outcome: Progressing 11/30/2020 0236 by Allayne Stack, RN Outcome: Progressing   Problem: Nutrition: Goal: Adequate nutrition will be maintained 11/30/2020 0236 by Allayne Stack, RN Outcome: Progressing 11/30/2020 0236 by Allayne Stack, RN Outcome: Progressing   Problem: Coping: Goal: Level of anxiety will decrease 11/30/2020  0236 by Allayne Stack, RN Outcome: Progressing 11/30/2020 0236 by Allayne Stack, RN Outcome: Progressing   Problem: Elimination: Goal: Will not experience complications related to bowel motility 11/30/2020 0236 by Allayne Stack, RN Outcome: Progressing 11/30/2020 0236 by Allayne Stack, RN Outcome: Progressing Goal: Will not experience complications related to urinary retention 11/30/2020 0236 by Allayne Stack, RN Outcome: Progressing 11/30/2020 0236 by Allayne Stack, RN Outcome: Progressing   Problem: Pain Managment: Goal: General experience of comfort will improve 11/30/2020 0236 by Allayne Stack, RN Outcome: Progressing 11/30/2020 0236 by Allayne Stack, RN Outcome: Progressing   Problem: Safety: Goal: Ability to remain free from injury will improve 11/30/2020 0236 by Allayne Stack, RN Outcome: Progressing 11/30/2020 0236 by Allayne Stack, RN Outcome: Progressing   Problem: Skin Integrity: Goal: Risk for impaired skin integrity will decrease 11/30/2020 0236 by Allayne Stack, RN Outcome: Progressing 11/30/2020 0236 by Allayne Stack, RN Outcome: Progressing   Problem: Education: Goal: Knowledge of disease or condition will improve 11/30/2020 0236 by Allayne Stack, RN Outcome: Progressing 11/30/2020 0236 by Allayne Stack, RN Outcome: Progressing Goal: Knowledge of the prescribed therapeutic regimen will improve 11/30/2020 0236 by Allayne Stack, RN Outcome: Progressing 11/30/2020 0236 by Allayne Stack, RN Outcome: Progressing Goal: Individualized Educational Video(s) 11/30/2020 0236 by Allayne Stack, RN Outcome: Progressing 11/30/2020 0236 by Allayne Stack, RN Outcome: Progressing   Problem: Activity: Goal: Ability to tolerate increased activity will improve 11/30/2020 0236 by Allayne Stack, RN Outcome: Progressing 11/30/2020 0236 by Allayne Stack, RN Outcome: Progressing Goal: Will verbalize the  importance of balancing activity with adequate rest periods 11/30/2020 0236 by Allayne Stack, RN Outcome: Progressing 11/30/2020 0236 by Allayne Stack, RN Outcome: Progressing   Problem: Respiratory: Goal: Ability to maintain a clear airway will improve 11/30/2020 0236 by Allayne Stack, RN Outcome: Progressing 11/30/2020 0236  by Allayne Stack, RN Outcome: Progressing Goal: Levels of oxygenation will improve 11/30/2020 0236 by Allayne Stack, RN Outcome: Progressing 11/30/2020 0236 by Allayne Stack, RN Outcome: Progressing Goal: Ability to maintain adequate ventilation will improve 11/30/2020 0236 by Allayne Stack, RN Outcome: Progressing 11/30/2020 0236 by Allayne Stack, RN Outcome: Progressing   Problem: Education: Goal: Knowledge of disease or condition will improve 11/30/2020 0236 by Allayne Stack, RN Outcome: Progressing 11/30/2020 0236 by Allayne Stack, RN Outcome: Progressing Goal: Knowledge of secondary prevention will improve 11/30/2020 0236 by Allayne Stack, RN Outcome: Progressing 11/30/2020 0236 by Allayne Stack, RN Outcome: Progressing Goal: Knowledge of patient specific risk factors addressed and post discharge goals established will improve 11/30/2020 0236 by Allayne Stack, RN Outcome: Progressing 11/30/2020 0236 by Allayne Stack, RN Outcome: Progressing Goal: Individualized Educational Video(s) 11/30/2020 0236 by Allayne Stack, RN Outcome: Progressing 11/30/2020 0236 by Allayne Stack, RN Outcome: Progressing

## 2020-12-01 LAB — BASIC METABOLIC PANEL
Anion gap: 9 (ref 5–15)
BUN: 25 mg/dL — ABNORMAL HIGH (ref 8–23)
CO2: 37 mmol/L — ABNORMAL HIGH (ref 22–32)
Calcium: 9.1 mg/dL (ref 8.9–10.3)
Chloride: 90 mmol/L — ABNORMAL LOW (ref 98–111)
Creatinine, Ser: 0.95 mg/dL (ref 0.44–1.00)
GFR, Estimated: >60 mL/min (ref >60)
Glucose, Bld: 131 mg/dL — ABNORMAL HIGH (ref 70–99)
Potassium: 3.1 mmol/L — ABNORMAL LOW (ref 3.5–5.1)
Sodium: 136 mmol/L (ref 135–145)

## 2020-12-01 LAB — GLUCOSE, CAPILLARY: Glucose-Capillary: 136 mg/dL — ABNORMAL HIGH (ref 70–99)

## 2020-12-01 LAB — MAGNESIUM: Magnesium: 2.1 mg/dL (ref 1.7–2.4)

## 2020-12-01 MED ORDER — OXYCODONE HCL 5 MG PO TABS: 5.0000 mg | ORAL_TABLET | Freq: Four times a day (QID) | ORAL | 0 refills | Status: DC | PRN

## 2020-12-01 MED ORDER — POTASSIUM CHLORIDE 20 MEQ PO PACK
40.0000 meq | PACK | Freq: Four times a day (QID) | ORAL | Status: DC
  Administered 2020-12-01: 40 meq via ORAL
  Filled 2020-12-01: qty 2

## 2020-12-01 MED ORDER — FUROSEMIDE 40 MG PO TABS
40.0000 mg | ORAL_TABLET | Freq: Every day | ORAL | Status: DC
  Filled 2020-12-01: qty 1
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
## 2020-12-01 NOTE — TOC Transition Note (Signed)
Transition of Care [facility]) - CM/SW Discharge Note   Patient Details  Name: Rebecca Mcintyre MRN: [id number] Date of Birth: 01-09-1946  [facility] CM/SW Contact:  Pollie Friar, RN Phone Number: 12/01/2020, 11:35 AM   Clinical Narrative:    Patient is discharging home with her daughter and [facility] services through [facility]. Daughter is aware of how to do the dressing changes to pts rt foot and bedside RN provided her with supplies for the dressing changes.  3 in 1 for home was delivered to the patients room.  Pt has support at home and transportation to home.    Final next level of care: Home w Home Health Services Barriers to Discharge: No Barriers Identified   Patient Goals and CMS Choice   CMS Medicare.gov Compare Post Acute Care list provided to:: Patient Choice offered to / list presented to : Patient  Discharge Placement                       Discharge Plan and Services                DME Arranged: 3-N-1 DME Agency: [employer] Date DME Agency Contacted: 11/30/20   Representative spoke with at DME Agency: Freda Munro HH Arranged: PT, OT, RN [facility] Agency: [facility] Date [place]: 11/30/20   Representative spoke with at [place]: [place] ([place]) Interventions     Readmission Risk Interventions No flowsheet data found.

## 2020-12-01 NOTE — Discharge Summary (Addendum)
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  endocardial borders. The left ventricular internal cavity size was normal in size. There is borderline left ventricular hypertrophy. Left ventricular diastolic parameters are indeterminate. Right Ventricle: The right ventricular size is normal. Right vetricular wall thickness was not well visualized. Right ventricular systolic function is normal. Left Atrium: Left atrial size was not well visualized.  Right Atrium: Right atrial size was not well visualized. Pericardium: A small pericardial effusion is present. Presence of pericardial fat pad. Mitral Valve: The mitral valve is grossly normal. Moderate to severe mitral annular calcification. Trivial mitral valve regurgitation. No evidence of mitral valve stenosis. Tricuspid Valve: The tricuspid valve is grossly normal. Tricuspid valve regurgitation is trivial. No evidence of tricuspid stenosis. Aortic  Valve: The aortic valve was not well visualized. Aortic valve regurgitation is not visualized. No aortic stenosis is present. Pulmonic Valve: The pulmonic valve was not well visualized. Pulmonic valve regurgitation is not visualized. Aorta: The aortic arch was not well visualized and the aortic root and ascending aorta are structurally normal, with no evidence of dilitation. IAS/Shunts: The interatrial septum appears to be lipomatous. The interatrial septum was not well visualized.  LEFT VENTRICLE PLAX 2D LVIDd:         4.30 cm  Diastology LVIDs:         2.60 cm  LV e' medial:    4.28 cm/s LV PW:         2.10 cm  LV E/e' medial:  18.1 LV IVS:        1.10 cm  LV e' lateral:   9.60 cm/s LVOT diam:     2.10 cm  LV E/e' lateral: 8.1 LV SV:         72 LV SV Index:   34 LVOT Area:     3.46 cm  RIGHT VENTRICLE RV S prime:     11.30 cm/s TAPSE (M-mode): 2.1 cm LEFT ATRIUM             Index       RIGHT ATRIUM           Index LA diam:        3.90 cm 1.83 cm/m  RA Area:     12.20 cm LA Vol (A2C):   51.3 ml 24.13 ml/m RA Volume:   30.00 ml  14.11 ml/m LA Vol (A4C):   53.8 ml 25.31 ml/m LA Biplane Vol: 57.3 ml 26.95 ml/m  AORTIC VALVE LVOT Vmax:   111.00 cm/s LVOT Vmean:  70.700 cm/s LVOT VTI:    0.208 m  AORTA Ao Root diam: 3.10 cm MITRAL VALVE MV Area (PHT): 2.95 cm    SHUNTS MV Decel Time: 257 msec    Systemic VTI:  0.21 m MV E velocity: 77.60 cm/s  Systemic Diam: 2.10 cm MV A velocity: 96.90 cm/s MV E/A ratio:  0.80 Buford Dresser MD Electronically signed by Buford Dresser MD Signature Date/Time: 11/29/2020/5:38:19 PM    Final      TODAY-DAY OF DISCHARGE:  Subjective:   Rebecca Mcintyre today has no headache,no chest abdominal pain,no new weakness tingling or numbness, feels much better wants to go home today.   Objective:   Blood pressure (!) 105/56, pulse 78, temperature 97.9 F (36.6 C), temperature source Oral, resp. rate 19, height _0  (1.6 m), weight 113.8 kg, SpO2 94 %.  Intake/Output  Summary (Last 24 hours) at 12/01/2020 1056 Last data filed at 11/30/2020 2234 Gross per 24 hour  Intake 243 ml  Output --  Net 243 ml   Filed Weights   11/29/20 0610 11/30/20 0500  Weight: 113.4 kg 113.8 kg    Exam: Awake Alert, Oriented *3, No new F.N deficits, Normal affect [place].AT,PERRAL Supple Neck,No JVD, No cervical lymphadenopathy appriciated.  Symmetrical Chest wall movement, Good air movement bilaterally, CTAB RRR,No Gallops,Rubs or new Murmurs, No Parasternal Heave +ve  B.Sounds, Abd Soft, Non tender, No organomegaly appriciated, No rebound -guarding or rigidity. No Cyanosis, Clubbing or edema, No new Rash or bruise   PERTINENT RADIOLOGIC STUDIES: ECHOCARDIOGRAM COMPLETE  Result Date: 11/29/2020    ECHOCARDIOGRAM REPORT   Patient Name:   Rebecca Mcintyre Date of Exam: 11/29/2020 Medical Rec #:  [id number]     Height:       63.0 in Accession #:    [id number]    Weight:       250.0 lb Date of Birth:  1946-01-28     BSA:          2.126 m Patient Age:    25 years      BP:           143/98 mmHg Patient Gender: F             HR:           74 bpm. Exam Location:  Inpatient Procedure: 2D Echo, Cardiac Doppler, Color Doppler and Intracardiac            Opacification Agent Indications:    CHF-Acute Diastolic N98.92                 Elevated Troponin  History:        Patient has prior history of Echocardiogram examinations, most                 recent 01/19/2019. CAD and Previous Myocardial Infarction; Risk                 Factors:Dyslipidemia and Hypertension.  Sonographer:    Bernadene Person RDCS Referring Phys: [id number] [place]  1. Left ventricular ejection fraction, by estimation, is 60 to 65%. The left ventricle has normal function. The left ventricle has no regional wall motion abnormalities. Left ventricular diastolic parameters are indeterminate.  2. Right ventricular systolic function is normal. The right ventricular size is normal.  3. A small pericardial effusion is present.  4.  The mitral valve is grossly normal. Trivial mitral valve regurgitation. No evidence of mitral stenosis. Moderate to severe mitral annular calcification.  5. The aortic valve was not well visualized. Aortic valve regurgitation is not visualized. No aortic stenosis is present. Conclusion(s)/Recommendation(s): Technically difficult study, even with use of echo contrast. Normal LVEF. Indeterminate diastolic function. FINDINGS  Left Ventricle: Left ventricular ejection fraction, by estimation, is 60 to 65%. The left ventricle has normal function. The left ventricle has no regional wall motion abnormalities. Definity contrast agent was given IV to delineate the left ventricular  endocardial borders. The left ventricular internal cavity size was normal in size. There is borderline left ventricular hypertrophy. Left ventricular diastolic parameters are indeterminate. Right Ventricle: The right ventricular size is normal. Right vetricular wall thickness was not well visualized. Right ventricular systolic function is normal. Left Atrium: Left atrial size was not well visualized. Right Atrium: Right atrial size was not well visualized. Pericardium: A small pericardial effusion is present. Presence of pericardial fat pad. Mitral Valve: The mitral valve is grossly normal. Moderate to severe mitral annular calcification. Trivial mitral valve regurgitation. No evidence of mitral valve stenosis. Tricuspid Valve: The tricuspid valve is grossly normal. Tricuspid valve regurgitation is trivial. No evidence of tricuspid stenosis. Aortic Valve: The aortic valve was not well visualized. Aortic valve regurgitation is not visualized. No aortic stenosis is present. Pulmonic Valve: The pulmonic valve was not well visualized. Pulmonic valve regurgitation is not visualized. Aorta: The aortic arch  was not well visualized and the aortic root and ascending aorta are structurally normal, with no evidence of dilitation. IAS/Shunts: The interatrial  septum appears to be lipomatous. The interatrial septum was not well visualized.  LEFT VENTRICLE PLAX 2D LVIDd:         4.30 cm  Diastology LVIDs:         2.60 cm  LV e' medial:    4.28 cm/s LV PW:         2.10 cm  LV E/e' medial:  18.1 LV IVS:        1.10 cm  LV e' lateral:   9.60 cm/s LVOT diam:     2.10 cm  LV E/e' lateral: 8.1 LV SV:         72 LV SV Index:   34 LVOT Area:     3.46 cm  RIGHT VENTRICLE RV S prime:     11.30 cm/s TAPSE (M-mode): 2.1 cm LEFT ATRIUM             Index       RIGHT ATRIUM           Index LA diam:        3.90 cm 1.83 cm/m  RA Area:     12.20 cm LA Vol (A2C):   51.3 ml 24.13 ml/m RA Volume:   30.00 ml  14.11 ml/m LA Vol (A4C):   53.8 ml 25.31 ml/m LA Biplane Vol: 57.3 ml 26.95 ml/m  AORTIC VALVE LVOT Vmax:   111.00 cm/s LVOT Vmean:  70.700 cm/s LVOT VTI:    0.208 m  AORTA Ao Root diam: 3.10 cm MITRAL VALVE MV Area (PHT): 2.95 cm    SHUNTS MV Decel Time: 257 msec    Systemic VTI:  0.21 m MV E velocity: 77.60 cm/s  Systemic Diam: 2.10 cm MV A velocity: 96.90 cm/s MV E/A ratio:  0.80 Buford Dresser MD Electronically signed by Buford Dresser MD Signature Date/Time: 11/29/2020/5:38:19 PM    Final      PERTINENT LAB RESULTS: CBC: Recent Labs    11/28/20 1544 11/28/20 1618  WBC 8.5  --   HGB 12.1 13.6  HCT 40.6 40.0  PLT 248  --    CMET CMP     Component Value Date/Time   NA 136 12/01/2020 0335   K 3.1 (L) 12/01/2020 0335   CL 90 (L) 12/01/2020 0335   CO2 37 (H) 12/01/2020 0335   GLUCOSE 131 (H) 12/01/2020 0335   GLUCOSE 121 (H) 03/25/2006 0850   BUN 25 (H) 12/01/2020 0335   CREATININE 0.95 12/01/2020 0335   CALCIUM 9.1 12/01/2020 0335   PROT 6.3 (L) 11/28/2020 1544   ALBUMIN 3.1 (L) 11/28/2020 1544   AST 28 11/28/2020 1544   ALT 25 11/28/2020 1544   ALKPHOS 67 11/28/2020 1544   BILITOT 0.7 11/28/2020 1544   GFRNONAA >60 12/01/2020 0335   GFRAA >60 11/12/2018 0954    GFR Estimated Creatinine Clearance: 63.2 mL/min (by C-G formula based  on SCr of 0.95 mg/dL). No results for input(s): LIPASE, AMYLASE in the last 72 hours. Recent Labs    11/28/20 1544  CKTOTAL 168   Invalid input(s): POCBNP Recent Labs    11/28/20 1844  DDIMER 0.93*   No results for input(s): HGBA1C in the last 72 hours. No results for input(s): CHOL, HDL, LDLCALC, TRIG, CHOLHDL, LDLDIRECT in the last 72 hours. Recent Labs    11/29/20 0003  TSH 3.354   Recent Labs  11/29/20 0003  VITAMINB12 696   Coags: Recent Labs    11/28/20 1816  INR 1.0   Microbiology: Recent Results (from the past 240 hour(s))  Resp Panel by RT-PCR (Flu A&B, Covid) Nasopharyngeal Swab     Status: None   Collection Time: 11/28/20  3:44 PM   Specimen: Nasopharyngeal Swab; Nasopharyngeal(NP) swabs in vial transport medium  Result Value Ref Range Status   SARS Coronavirus 2 by RT PCR NEGATIVE NEGATIVE Final    Comment: (NOTE) SARS-CoV-2 target nucleic acids are NOT DETECTED.  The SARS-CoV-2 RNA is generally detectable in upper respiratory specimens during the acute phase of infection. The lowest concentration of SARS-CoV-2 viral copies this assay can detect is 138 copies/mL. A negative result does not preclude SARS-Cov-2 infection and should not be used as the sole basis for treatment or other patient management decisions. A negative result may occur with  improper specimen collection/handling, submission of specimen other than nasopharyngeal swab, presence of viral mutation(s) within the areas targeted by this assay, and inadequate number of viral copies(<138 copies/mL). A negative result must be combined with clinical observations, patient history, and epidemiological information. The expected result is Negative.  Fact Sheet for Patients:  EntrepreneurPulse.com.au  Fact Sheet for Healthcare Providers:  IncredibleEmployment.be  This test is no t yet approved or cleared by the [place] FDA and  has been authorized  for detection and/or diagnosis of SARS-CoV-2 by FDA under an Emergency Use Authorization (EUA). This EUA will remain  in effect (meaning this test can be used) for the duration of the COVID-19 declaration under Section 564(b)(1) of the Act, 21 U.S.C.section 360bbb-3(b)(1), unless the authorization is terminated  or revoked sooner.       Influenza A by PCR NEGATIVE NEGATIVE Final   Influenza B by PCR NEGATIVE NEGATIVE Final    Comment: (NOTE) The Xpert Xpress SARS-CoV-2/FLU/RSV plus assay is intended as an aid in the diagnosis of influenza from Nasopharyngeal swab specimens and should not be used as a sole basis for treatment. Nasal washings and aspirates are unacceptable for Xpert Xpress SARS-CoV-2/FLU/RSV testing.  Fact Sheet for Patients: EntrepreneurPulse.com.au  Fact Sheet for Healthcare Providers: IncredibleEmployment.be  This test is not yet approved or cleared by the [place] FDA and has been authorized for detection and/or diagnosis of SARS-CoV-2 by FDA under an Emergency Use Authorization (EUA). This EUA will remain in effect (meaning this test can be used) for the duration of the COVID-19 declaration under Section 564(b)(1) of the Act, 21 U.S.C. section 360bbb-3(b)(1), unless the authorization is terminated or revoked.  Performed at [facility], [place] [address] [place], [place] [address]   Culture, blood (single)     Status: None (Preliminary result)   Collection Time: 11/28/20  4:10 PM   Specimen: BLOOD LEFT ARM  Result Value Ref Range Status   Specimen Description BLOOD LEFT ARM  Final   Special Requests   Final    BOTTLES DRAWN AEROBIC AND ANAEROBIC Blood Culture adequate volume   Culture   Final    NO GROWTH 3 DAYS Performed at [facility], [address] [place], [place] [address]    Report Status PENDING  Incomplete    FURTHER DISCHARGE INSTRUCTIONS:  Get Medicines reviewed and adjusted: Please  take all your medications with you for your next visit with your Primary MD  Laboratory/radiological data: Please request your Primary MD to go over all hospital tests and procedure/radiological results at the follow up, please ask your  Primary MD to get all Hospital records sent to his/her office.  In some cases, they will be blood work, cultures and biopsy results pending at the time of your discharge. Please request that your primary care M.D. goes through all the records of your hospital data and follows up on these results.  Also Note the following: If you experience worsening of your admission symptoms, develop shortness of breath, life threatening emergency, suicidal or homicidal thoughts you must seek medical attention immediately by calling 911 or calling your MD immediately  if symptoms less severe.  You must read complete instructions/literature along with all the possible adverse reactions/side effects for all the Medicines you take and that have been prescribed to you. Take any new Medicines after you have completely understood and accpet all the possible adverse reactions/side effects.   Do not drive when taking Pain medications or sleeping medications (Benzodaizepines)  Do not take more than prescribed Pain, Sleep and Anxiety Medications. It is not advisable to combine anxiety,sleep and pain medications without talking with your primary care practitioner  Special Instructions: If you have smoked or chewed Tobacco  in the last 2 yrs please stop smoking, stop any regular Alcohol  and or any Recreational drug use.  Wear Seat belts while driving.  Please note: You were cared for by a hospitalist during your hospital stay. Once you are discharged, your primary care physician will handle any further medical issues. Please note that NO REFILLS for any discharge medications will be authorized once you are discharged, as it is imperative that you return to your primary care physician (or  establish a relationship with a primary care physician if you do not have one) for your post hospital discharge needs so that they can reassess your need for medications and monitor your lab values.  Total Time spent coordinating discharge including counseling, education and face to face time equals 35 minutes.  SignedOren Binet 12/01/2020 10:56 AM

## 2020-12-01 NOTE — Plan of Care (Signed)
Problem: Education: Goal: Knowledge of General Education information will improve Description: Including pain rating scale, medication(s)/side effects and non-pharmacologic comfort measures Outcome: Adequate for Discharge   Problem: Health Behavior/Discharge Planning: Goal: Ability to manage health-related needs will improve Outcome: Adequate for Discharge   Problem: Clinical Measurements: Goal: Ability to maintain clinical measurements within normal limits will improve Outcome: Adequate for Discharge Goal: Will remain free from infection Outcome: Adequate for Discharge Goal: Diagnostic test results will improve Outcome: Adequate for Discharge Goal: Respiratory complications will improve Outcome: Adequate for Discharge Goal: Cardiovascular complication will be avoided Outcome: Adequate for Discharge   Problem: Activity: Goal: Risk for activity intolerance will decrease Outcome: Adequate for Discharge   Problem: Nutrition: Goal: Adequate nutrition will be maintained Outcome: Adequate for Discharge   Problem: Coping: Goal: Level of anxiety will decrease Outcome: Adequate for Discharge   Problem: Elimination: Goal: Will not experience complications related to bowel motility Outcome: Adequate for Discharge Goal: Will not experience complications related to urinary retention Outcome: Adequate for Discharge   Problem: Pain Managment: Goal: General experience of comfort will improve Outcome: Adequate for Discharge   Problem: Safety: Goal: Ability to remain free from injury will improve Outcome: Adequate for Discharge   Problem: Skin Integrity: Goal: Risk for impaired skin integrity will decrease Outcome: Adequate for Discharge   Problem: Education: Goal: Knowledge of disease or condition will improve Outcome: Adequate for Discharge Goal: Knowledge of the prescribed therapeutic regimen will improve Outcome: Adequate for Discharge Goal: Individualized Educational  Video(s) Outcome: Adequate for Discharge   Problem: Activity: Goal: Ability to tolerate increased activity will improve Outcome: Adequate for Discharge Goal: Will verbalize the importance of balancing activity with adequate rest periods Outcome: Adequate for Discharge   Problem: Respiratory: Goal: Ability to maintain a clear airway will improve Outcome: Adequate for Discharge Goal: Levels of oxygenation will improve Outcome: Adequate for Discharge Goal: Ability to maintain adequate ventilation will improve Outcome: Adequate for Discharge   Problem: Acute Rehab OT Goals (only OT should resolve) Goal: Pt. Will Perform Grooming Outcome: Adequate for Discharge Goal: Pt. Will Perform Upper Body Dressing Outcome: Adequate for Discharge Goal: Pt. Will Perform Lower Body Dressing Outcome: Adequate for Discharge Goal: Pt. Will Transfer To Toilet Outcome: Adequate for Discharge Goal: Pt. Will Perform Toileting-Clothing Manipulation Outcome: Adequate for Discharge Goal: OT Additional ADL Goal #1 Outcome: Adequate for Discharge   Problem: SLP Dysphagia Goals Goal: Patient will utilize recommended strategies Description: Patient will utilize recommended strategies during swallow to increase swallowing safety with Outcome: Adequate for Discharge Goal: Patient will demonstrate readiness for PO's Description: Patient will demonstrate readiness for PO's and/or instrumental swallow study as evidenced by: Outcome: Adequate for Discharge Goal: Misc Dysphagia Goal Outcome: Adequate for Discharge   Problem: SLP Cognition Goals Goal: Patient will demonstrate attention to functional Description: Patient will demonstrate attention to functional task with Outcome: Adequate for Discharge Goal: Patient will utilize external memory aids Description: Patient will utilize external memory aids to facilitate recall of information for improved safety with Outcome: Adequate for Discharge Goal: Patient  will demonstrate problem solving skills Description: Patient will demonstrate problem solving skills during functional ADL's with Outcome: Adequate for Discharge Goal: Patient will demonstrate awareness during Description: Patient will demonstrate awareness during functional ADL for improved safety Outcome: Adequate for Discharge Goal: Misc Cognitive Goal #1 Outcome: Adequate for Discharge Goal: Misc Cognitive Goal #2 Outcome: Adequate for Discharge   Problem: SLP Language Goals Goal: Patient will utilize speech intelligibility Description: Patient will utilize speech  intelligibility strategies to  enhance communication with Outcome: Adequate for Discharge Goal: Patient will communicate needs/wants with Outcome: Adequate for Discharge Goal: Patient will follow commands during a functional ADL with Outcome: Adequate for Discharge Goal: Misc Speech Goal #1 Outcome: Adequate for Discharge Goal: Misc Speech Goal #2 Outcome: Adequate for Discharge Goal: Misc Speech Goal #3 Outcome: Adequate for Discharge   Problem: SLP PMSV Goals Goal: Patient will utilize PMSV for minutes without signs Description: Patient will utilize PMSV for minutes without signs of distress or decline in vital signs with Outcome: Adequate for Discharge Goal: Patient will utilize compensatory strategies Description: Patient will utilize compensatory strategies with PMSV in place to achieve functional phonation with Outcome: Adequate for Discharge Goal: Patient will demonstrate placement and removal PMSV Description: Patient will demonstrate placement and removal of PMSV with Outcome: Adequate for Discharge Goal: Patient will demonstrate understanding of PMSV care Description: Patient will demonstrate understanding of PMSV care and cleaning with Outcome: Adequate for Discharge Goal: Misc PMSV Goal #1 Outcome: Adequate for Discharge Goal: Misc PMSV Goal #2 Outcome: Adequate for Discharge   Problem:  Education: Goal: Knowledge of disease or condition will improve Outcome: Adequate for Discharge Goal: Knowledge of secondary prevention will improve Outcome: Adequate for Discharge Goal: Knowledge of patient specific risk factors addressed and post discharge goals established will improve Outcome: Adequate for Discharge Goal: Individualized Educational Video(s) Outcome: Adequate for Discharge   Problem: Acute Rehab PT Goals(only PT should resolve) Goal: Patient Will Transfer Sit To/From Stand Outcome: Adequate for Discharge Goal: Pt Will Transfer Bed To Chair/Chair To Bed Outcome: Adequate for Discharge Goal: Pt Will Ambulate Outcome: Adequate for Discharge Goal: Pt Will Go Up/Down Stairs Outcome: Adequate for Discharge
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
## 2020-12-02 DIAGNOSIS — M8580 Other specified disorders of bone density and structure, unspecified site: Secondary | ICD-10-CM | POA: Diagnosis not present

## 2020-12-02 DIAGNOSIS — I5033 Acute on chronic diastolic (congestive) heart failure: Secondary | ICD-10-CM | POA: Diagnosis not present

## 2020-12-02 DIAGNOSIS — I252 Old myocardial infarction: Secondary | ICD-10-CM | POA: Diagnosis not present

## 2020-12-02 DIAGNOSIS — Z7982 Long term (current) use of aspirin: Secondary | ICD-10-CM | POA: Diagnosis not present

## 2020-12-02 DIAGNOSIS — Z7984 Long term (current) use of oral hypoglycemic drugs: Secondary | ICD-10-CM | POA: Diagnosis not present

## 2020-12-02 DIAGNOSIS — Z9981 Dependence on supplemental oxygen: Secondary | ICD-10-CM | POA: Diagnosis not present

## 2020-12-02 DIAGNOSIS — Z955 Presence of coronary angioplasty implant and graft: Secondary | ICD-10-CM | POA: Diagnosis not present

## 2020-12-02 DIAGNOSIS — Z8673 Personal history of transient ischemic attack (TIA), and cerebral infarction without residual deficits: Secondary | ICD-10-CM | POA: Diagnosis not present

## 2020-12-02 DIAGNOSIS — I2510 Atherosclerotic heart disease of native coronary artery without angina pectoris: Secondary | ICD-10-CM | POA: Diagnosis not present

## 2020-12-02 DIAGNOSIS — J441 Chronic obstructive pulmonary disease with (acute) exacerbation: Secondary | ICD-10-CM | POA: Diagnosis not present

## 2020-12-02 DIAGNOSIS — G9340 Encephalopathy, unspecified: Secondary | ICD-10-CM | POA: Diagnosis not present

## 2020-12-02 DIAGNOSIS — K7460 Unspecified cirrhosis of liver: Secondary | ICD-10-CM | POA: Diagnosis not present

## 2020-12-02 DIAGNOSIS — Z87891 Personal history of nicotine dependence: Secondary | ICD-10-CM | POA: Diagnosis not present

## 2020-12-02 DIAGNOSIS — K219 Gastro-esophageal reflux disease without esophagitis: Secondary | ICD-10-CM | POA: Diagnosis not present

## 2020-12-02 DIAGNOSIS — M109 Gout, unspecified: Secondary | ICD-10-CM | POA: Diagnosis not present

## 2020-12-02 DIAGNOSIS — S90921D Unspecified superficial injury of right foot, subsequent encounter: Secondary | ICD-10-CM | POA: Diagnosis not present

## 2020-12-02 DIAGNOSIS — M1990 Unspecified osteoarthritis, unspecified site: Secondary | ICD-10-CM | POA: Diagnosis not present

## 2020-12-02 DIAGNOSIS — I130 Hypertensive heart and chronic kidney disease with heart failure and stage 1 through stage 4 chronic kidney disease, or unspecified chronic kidney disease: Secondary | ICD-10-CM | POA: Diagnosis not present

## 2020-12-02 DIAGNOSIS — J9621 Acute and chronic respiratory failure with hypoxia: Secondary | ICD-10-CM | POA: Diagnosis not present

## 2020-12-02 DIAGNOSIS — E041 Nontoxic single thyroid nodule: Secondary | ICD-10-CM | POA: Diagnosis not present

## 2020-12-02 DIAGNOSIS — I728 Aneurysm of other specified arteries: Secondary | ICD-10-CM | POA: Diagnosis not present

## 2020-12-02 DIAGNOSIS — E785 Hyperlipidemia, unspecified: Secondary | ICD-10-CM | POA: Diagnosis not present

## 2020-12-02 DIAGNOSIS — N189 Chronic kidney disease, unspecified: Secondary | ICD-10-CM | POA: Diagnosis not present

## 2020-12-02 DIAGNOSIS — E1122 Type 2 diabetes mellitus with diabetic chronic kidney disease: Secondary | ICD-10-CM | POA: Diagnosis not present

## 2020-12-03 DIAGNOSIS — I130 Hypertensive heart and chronic kidney disease with heart failure and stage 1 through stage 4 chronic kidney disease, or unspecified chronic kidney disease: Secondary | ICD-10-CM | POA: Diagnosis not present

## 2020-12-03 DIAGNOSIS — J9621 Acute and chronic respiratory failure with hypoxia: Secondary | ICD-10-CM | POA: Diagnosis not present

## 2020-12-03 DIAGNOSIS — J441 Chronic obstructive pulmonary disease with (acute) exacerbation: Secondary | ICD-10-CM | POA: Diagnosis not present

## 2020-12-03 DIAGNOSIS — I252 Old myocardial infarction: Secondary | ICD-10-CM | POA: Diagnosis not present

## 2020-12-03 DIAGNOSIS — G9340 Encephalopathy, unspecified: Secondary | ICD-10-CM | POA: Diagnosis not present

## 2020-12-03 DIAGNOSIS — M8580 Other specified disorders of bone density and structure, unspecified site: Secondary | ICD-10-CM | POA: Diagnosis not present

## 2020-12-03 DIAGNOSIS — M109 Gout, unspecified: Secondary | ICD-10-CM | POA: Diagnosis not present

## 2020-12-03 DIAGNOSIS — Z8673 Personal history of transient ischemic attack (TIA), and cerebral infarction without residual deficits: Secondary | ICD-10-CM | POA: Diagnosis not present

## 2020-12-03 DIAGNOSIS — Z955 Presence of coronary angioplasty implant and graft: Secondary | ICD-10-CM | POA: Diagnosis not present

## 2020-12-03 DIAGNOSIS — N189 Chronic kidney disease, unspecified: Secondary | ICD-10-CM | POA: Diagnosis not present

## 2020-12-03 DIAGNOSIS — S90921D Unspecified superficial injury of right foot, subsequent encounter: Secondary | ICD-10-CM | POA: Diagnosis not present

## 2020-12-03 DIAGNOSIS — I2510 Atherosclerotic heart disease of native coronary artery without angina pectoris: Secondary | ICD-10-CM | POA: Diagnosis not present

## 2020-12-03 DIAGNOSIS — Z7982 Long term (current) use of aspirin: Secondary | ICD-10-CM | POA: Diagnosis not present

## 2020-12-03 DIAGNOSIS — Z87891 Personal history of nicotine dependence: Secondary | ICD-10-CM | POA: Diagnosis not present

## 2020-12-03 DIAGNOSIS — K7460 Unspecified cirrhosis of liver: Secondary | ICD-10-CM | POA: Diagnosis not present

## 2020-12-03 DIAGNOSIS — I728 Aneurysm of other specified arteries: Secondary | ICD-10-CM | POA: Diagnosis not present

## 2020-12-03 DIAGNOSIS — K219 Gastro-esophageal reflux disease without esophagitis: Secondary | ICD-10-CM | POA: Diagnosis not present

## 2020-12-03 DIAGNOSIS — E1122 Type 2 diabetes mellitus with diabetic chronic kidney disease: Secondary | ICD-10-CM | POA: Diagnosis not present

## 2020-12-03 DIAGNOSIS — Z9981 Dependence on supplemental oxygen: Secondary | ICD-10-CM | POA: Diagnosis not present

## 2020-12-03 DIAGNOSIS — E785 Hyperlipidemia, unspecified: Secondary | ICD-10-CM | POA: Diagnosis not present

## 2020-12-03 DIAGNOSIS — E041 Nontoxic single thyroid nodule: Secondary | ICD-10-CM | POA: Diagnosis not present

## 2020-12-03 DIAGNOSIS — I5033 Acute on chronic diastolic (congestive) heart failure: Secondary | ICD-10-CM | POA: Diagnosis not present

## 2020-12-03 DIAGNOSIS — Z7984 Long term (current) use of oral hypoglycemic drugs: Secondary | ICD-10-CM | POA: Diagnosis not present

## 2020-12-03 DIAGNOSIS — M1990 Unspecified osteoarthritis, unspecified site: Secondary | ICD-10-CM | POA: Diagnosis not present

## 2020-12-03 LAB — CULTURE, BLOOD (SINGLE)
Culture: NO GROWTH
Special Requests: ADEQUATE

## 2020-12-04 ENCOUNTER — Telehealth: Payer: Self-pay

## 2020-12-04 NOTE — Telephone Encounter (Signed)
Transition Care Management Follow-up Telephone Call Date of discharge and from where: 12/01/2020  Rebecca Mcintyre  How have you been since you were released from the hospital? Resting well Any questions or concerns? No  Items Reviewed: Did the pt receive and understand the discharge instructions provided? Yes  Medications obtained and verified? Yes  Other? No  Any new allergies since your discharge? No  Dietary orders reviewed? Yes Do you have support at home? Yes   Home Care and Equipment/Supplies: Were home health services ordered? yes If so, what is the name of the agency?  Has the agency set up a time to come to the patient's home? yes Were any new equipment or medical supplies ordered?  Yes:   [facility]  What is the name of the medical supply agency? [facility]  Were you able to get the supplies/equipment? yes Do you have any questions related to the use of the equipment or supplies? No  Functional Questionnaire: (I = Independent and D = Dependent) ADLs: I  Bathing/Dressing- D  Meal Prep- D  Eating- I  Maintaining continence- I  Transferring/Ambulation- D  Managing Meds- D  Follow up appointments reviewed:  PCP Hospital f/u appt confirmed? Yes  Scheduled to see Dr.Panosh on 12/13/2020 @ 330pm. Are transportation arrangements needed? No  If their condition worsens, is the pt aware to call PCP or go to the Emergency Dept.? Yes Was the patient provided with contact information for the PCP's office or ED? Yes Was to pt encouraged to call back with questions or concerns? Yes

## 2020-12-04 NOTE — Telephone Encounter (Signed)
I have advise Rebecca Mcintyre that I spoke with [name] and she said that she had already scheduled the patient with [name]. [name] said that she needs clarification to put the pieces together. [name] said that she will try and call her back after 4:30
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
## 2020-12-04 NOTE — Telephone Encounter (Signed)
So I cannot order controlled substance without a patient visit.  Glad she is out of the hospital.   Make appointment with me within the next 1 to 2 weeks  If needing narcotics on a regular basis she may need to see an orthopedist.  Despite the negative x-rays.

## 2020-12-05 ENCOUNTER — Other Ambulatory Visit: Payer: Self-pay

## 2020-12-05 ENCOUNTER — Ambulatory Visit (HOSPITAL_COMMUNITY): Payer: Medicare Other

## 2020-12-05 ENCOUNTER — Encounter: Payer: Self-pay | Admitting: Internal Medicine

## 2020-12-05 ENCOUNTER — Ambulatory Visit (INDEPENDENT_AMBULATORY_CARE_PROVIDER_SITE_OTHER): Payer: Medicare Other | Admitting: Internal Medicine

## 2020-12-05 VITALS — BP 116/60 | HR 81 | Temp 97.7°F | Ht 63.0 in | Wt 250.9 lb

## 2020-12-05 DIAGNOSIS — Z9981 Dependence on supplemental oxygen: Secondary | ICD-10-CM | POA: Diagnosis not present

## 2020-12-05 DIAGNOSIS — Z79899 Other long term (current) drug therapy: Secondary | ICD-10-CM | POA: Diagnosis not present

## 2020-12-05 DIAGNOSIS — J9621 Acute and chronic respiratory failure with hypoxia: Secondary | ICD-10-CM | POA: Diagnosis not present

## 2020-12-05 DIAGNOSIS — Z8673 Personal history of transient ischemic attack (TIA), and cerebral infarction without residual deficits: Secondary | ICD-10-CM | POA: Diagnosis not present

## 2020-12-05 DIAGNOSIS — S90921D Unspecified superficial injury of right foot, subsequent encounter: Secondary | ICD-10-CM | POA: Diagnosis not present

## 2020-12-05 DIAGNOSIS — K7460 Unspecified cirrhosis of liver: Secondary | ICD-10-CM | POA: Diagnosis not present

## 2020-12-05 DIAGNOSIS — E785 Hyperlipidemia, unspecified: Secondary | ICD-10-CM | POA: Diagnosis not present

## 2020-12-05 DIAGNOSIS — S99921A Unspecified injury of right foot, initial encounter: Secondary | ICD-10-CM | POA: Diagnosis not present

## 2020-12-05 DIAGNOSIS — G9340 Encephalopathy, unspecified: Secondary | ICD-10-CM | POA: Diagnosis not present

## 2020-12-05 DIAGNOSIS — Z955 Presence of coronary angioplasty implant and graft: Secondary | ICD-10-CM | POA: Diagnosis not present

## 2020-12-05 DIAGNOSIS — I671 Cerebral aneurysm, nonruptured: Secondary | ICD-10-CM

## 2020-12-05 DIAGNOSIS — S91301A Unspecified open wound, right foot, initial encounter: Secondary | ICD-10-CM | POA: Diagnosis not present

## 2020-12-05 DIAGNOSIS — E041 Nontoxic single thyroid nodule: Secondary | ICD-10-CM

## 2020-12-05 DIAGNOSIS — I728 Aneurysm of other specified arteries: Secondary | ICD-10-CM | POA: Diagnosis not present

## 2020-12-05 DIAGNOSIS — Z87891 Personal history of nicotine dependence: Secondary | ICD-10-CM | POA: Diagnosis not present

## 2020-12-05 DIAGNOSIS — K219 Gastro-esophageal reflux disease without esophagitis: Secondary | ICD-10-CM | POA: Diagnosis not present

## 2020-12-05 DIAGNOSIS — E1165 Type 2 diabetes mellitus with hyperglycemia: Secondary | ICD-10-CM

## 2020-12-05 DIAGNOSIS — Z7984 Long term (current) use of oral hypoglycemic drugs: Secondary | ICD-10-CM | POA: Diagnosis not present

## 2020-12-05 DIAGNOSIS — T07XXXA Unspecified multiple injuries, initial encounter: Secondary | ICD-10-CM | POA: Diagnosis not present

## 2020-12-05 DIAGNOSIS — I5033 Acute on chronic diastolic (congestive) heart failure: Secondary | ICD-10-CM | POA: Diagnosis not present

## 2020-12-05 DIAGNOSIS — M8580 Other specified disorders of bone density and structure, unspecified site: Secondary | ICD-10-CM | POA: Diagnosis not present

## 2020-12-05 DIAGNOSIS — J441 Chronic obstructive pulmonary disease with (acute) exacerbation: Secondary | ICD-10-CM | POA: Diagnosis not present

## 2020-12-05 DIAGNOSIS — I130 Hypertensive heart and chronic kidney disease with heart failure and stage 1 through stage 4 chronic kidney disease, or unspecified chronic kidney disease: Secondary | ICD-10-CM | POA: Diagnosis not present

## 2020-12-05 DIAGNOSIS — Z7982 Long term (current) use of aspirin: Secondary | ICD-10-CM | POA: Diagnosis not present

## 2020-12-05 DIAGNOSIS — M1990 Unspecified osteoarthritis, unspecified site: Secondary | ICD-10-CM | POA: Diagnosis not present

## 2020-12-05 DIAGNOSIS — E1122 Type 2 diabetes mellitus with diabetic chronic kidney disease: Secondary | ICD-10-CM | POA: Diagnosis not present

## 2020-12-05 DIAGNOSIS — I252 Old myocardial infarction: Secondary | ICD-10-CM | POA: Diagnosis not present

## 2020-12-05 DIAGNOSIS — N189 Chronic kidney disease, unspecified: Secondary | ICD-10-CM | POA: Diagnosis not present

## 2020-12-05 DIAGNOSIS — I2510 Atherosclerotic heart disease of native coronary artery without angina pectoris: Secondary | ICD-10-CM | POA: Diagnosis not present

## 2020-12-05 DIAGNOSIS — M109 Gout, unspecified: Secondary | ICD-10-CM | POA: Diagnosis not present

## 2020-12-05 MED ORDER — OXYCODONE HCL 5 MG PO TABS: 5.0000 mg | ORAL_TABLET | Freq: Four times a day (QID) | ORAL | 0 refills | Status: DC | PRN

## 2020-12-05 NOTE — Patient Instructions (Addendum)
Send in pictures every few days of wound.  Contact us for  oxycodone refill.   Let us know if no appt neurology /NS.   Plan F/U  in about 2 weeks   Will deal with  thyroid nodule later.
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
## 2020-12-06 ENCOUNTER — Telehealth: Payer: Self-pay

## 2020-12-06 NOTE — Addendum Note (Signed)
Addended by: Nilda Riggs on: 12/06/2020 09:23 AM   Modules accepted: Orders

## 2020-12-06 NOTE — Telephone Encounter (Signed)
I have placed orders into Epic for Larger toilet seat. I have also sent a community message to [employer] [facility] for this request. I am currently awaiting a response from [employer] [facility].

## 2020-12-06 NOTE — Telephone Encounter (Signed)
See patient visit July 12

## 2020-12-06 NOTE — Telephone Encounter (Signed)
-----  Message from Burnis Medin, MD sent at 12/05/2020  5:41 PM EDT ----- Please  make order to [facility] home health agency ? [facility]?  For larger potty chair  . thanks

## 2020-12-06 NOTE — Telephone Encounter (Signed)
Thanks for the picture and update.

## 2020-12-07 ENCOUNTER — Ambulatory Visit (HOSPITAL_COMMUNITY): Payer: Medicare Other

## 2020-12-08 DIAGNOSIS — I2510 Atherosclerotic heart disease of native coronary artery without angina pectoris: Secondary | ICD-10-CM | POA: Diagnosis not present

## 2020-12-08 DIAGNOSIS — M8580 Other specified disorders of bone density and structure, unspecified site: Secondary | ICD-10-CM | POA: Diagnosis not present

## 2020-12-08 DIAGNOSIS — E1122 Type 2 diabetes mellitus with diabetic chronic kidney disease: Secondary | ICD-10-CM | POA: Diagnosis not present

## 2020-12-08 DIAGNOSIS — M1990 Unspecified osteoarthritis, unspecified site: Secondary | ICD-10-CM | POA: Diagnosis not present

## 2020-12-08 DIAGNOSIS — G9340 Encephalopathy, unspecified: Secondary | ICD-10-CM | POA: Diagnosis not present

## 2020-12-08 DIAGNOSIS — Z7984 Long term (current) use of oral hypoglycemic drugs: Secondary | ICD-10-CM | POA: Diagnosis not present

## 2020-12-08 DIAGNOSIS — E785 Hyperlipidemia, unspecified: Secondary | ICD-10-CM | POA: Diagnosis not present

## 2020-12-08 DIAGNOSIS — M109 Gout, unspecified: Secondary | ICD-10-CM | POA: Diagnosis not present

## 2020-12-08 DIAGNOSIS — I252 Old myocardial infarction: Secondary | ICD-10-CM | POA: Diagnosis not present

## 2020-12-08 DIAGNOSIS — I728 Aneurysm of other specified arteries: Secondary | ICD-10-CM | POA: Diagnosis not present

## 2020-12-08 DIAGNOSIS — N189 Chronic kidney disease, unspecified: Secondary | ICD-10-CM | POA: Diagnosis not present

## 2020-12-08 DIAGNOSIS — Z8673 Personal history of transient ischemic attack (TIA), and cerebral infarction without residual deficits: Secondary | ICD-10-CM | POA: Diagnosis not present

## 2020-12-08 DIAGNOSIS — Z7982 Long term (current) use of aspirin: Secondary | ICD-10-CM | POA: Diagnosis not present

## 2020-12-08 DIAGNOSIS — Z9981 Dependence on supplemental oxygen: Secondary | ICD-10-CM | POA: Diagnosis not present

## 2020-12-08 DIAGNOSIS — K219 Gastro-esophageal reflux disease without esophagitis: Secondary | ICD-10-CM | POA: Diagnosis not present

## 2020-12-08 DIAGNOSIS — E041 Nontoxic single thyroid nodule: Secondary | ICD-10-CM | POA: Diagnosis not present

## 2020-12-08 DIAGNOSIS — Z87891 Personal history of nicotine dependence: Secondary | ICD-10-CM | POA: Diagnosis not present

## 2020-12-08 DIAGNOSIS — I5033 Acute on chronic diastolic (congestive) heart failure: Secondary | ICD-10-CM | POA: Diagnosis not present

## 2020-12-08 DIAGNOSIS — J9621 Acute and chronic respiratory failure with hypoxia: Secondary | ICD-10-CM | POA: Diagnosis not present

## 2020-12-08 DIAGNOSIS — Z955 Presence of coronary angioplasty implant and graft: Secondary | ICD-10-CM | POA: Diagnosis not present

## 2020-12-08 DIAGNOSIS — S90921D Unspecified superficial injury of right foot, subsequent encounter: Secondary | ICD-10-CM | POA: Diagnosis not present

## 2020-12-08 DIAGNOSIS — J441 Chronic obstructive pulmonary disease with (acute) exacerbation: Secondary | ICD-10-CM | POA: Diagnosis not present

## 2020-12-08 DIAGNOSIS — I130 Hypertensive heart and chronic kidney disease with heart failure and stage 1 through stage 4 chronic kidney disease, or unspecified chronic kidney disease: Secondary | ICD-10-CM | POA: Diagnosis not present

## 2020-12-08 DIAGNOSIS — K7460 Unspecified cirrhosis of liver: Secondary | ICD-10-CM | POA: Diagnosis not present

## 2020-12-08 NOTE — Telephone Encounter (Signed)
I agree   Please do referral to wound specialist  to be seen  asap.

## 2020-12-08 NOTE — Addendum Note (Signed)
Addended by: Nathanial Millman E on: 12/08/2020 02:09 PM   Modules accepted: Orders

## 2020-12-11 ENCOUNTER — Telehealth: Payer: Self-pay | Admitting: Pharmacist

## 2020-12-11 DIAGNOSIS — K219 Gastro-esophageal reflux disease without esophagitis: Secondary | ICD-10-CM | POA: Diagnosis not present

## 2020-12-11 DIAGNOSIS — M1990 Unspecified osteoarthritis, unspecified site: Secondary | ICD-10-CM | POA: Diagnosis not present

## 2020-12-11 DIAGNOSIS — J449 Chronic obstructive pulmonary disease, unspecified: Secondary | ICD-10-CM | POA: Diagnosis not present

## 2020-12-11 DIAGNOSIS — M109 Gout, unspecified: Secondary | ICD-10-CM | POA: Diagnosis not present

## 2020-12-11 DIAGNOSIS — Z7982 Long term (current) use of aspirin: Secondary | ICD-10-CM | POA: Diagnosis not present

## 2020-12-11 DIAGNOSIS — J9621 Acute and chronic respiratory failure with hypoxia: Secondary | ICD-10-CM | POA: Diagnosis not present

## 2020-12-11 DIAGNOSIS — Z8673 Personal history of transient ischemic attack (TIA), and cerebral infarction without residual deficits: Secondary | ICD-10-CM | POA: Diagnosis not present

## 2020-12-11 DIAGNOSIS — K7460 Unspecified cirrhosis of liver: Secondary | ICD-10-CM | POA: Diagnosis not present

## 2020-12-11 DIAGNOSIS — S90921D Unspecified superficial injury of right foot, subsequent encounter: Secondary | ICD-10-CM | POA: Diagnosis not present

## 2020-12-11 DIAGNOSIS — Z9981 Dependence on supplemental oxygen: Secondary | ICD-10-CM | POA: Diagnosis not present

## 2020-12-11 DIAGNOSIS — I130 Hypertensive heart and chronic kidney disease with heart failure and stage 1 through stage 4 chronic kidney disease, or unspecified chronic kidney disease: Secondary | ICD-10-CM | POA: Diagnosis not present

## 2020-12-11 DIAGNOSIS — Z87891 Personal history of nicotine dependence: Secondary | ICD-10-CM | POA: Diagnosis not present

## 2020-12-11 DIAGNOSIS — I2510 Atherosclerotic heart disease of native coronary artery without angina pectoris: Secondary | ICD-10-CM | POA: Diagnosis not present

## 2020-12-11 DIAGNOSIS — J441 Chronic obstructive pulmonary disease with (acute) exacerbation: Secondary | ICD-10-CM | POA: Diagnosis not present

## 2020-12-11 DIAGNOSIS — I252 Old myocardial infarction: Secondary | ICD-10-CM | POA: Diagnosis not present

## 2020-12-11 DIAGNOSIS — E041 Nontoxic single thyroid nodule: Secondary | ICD-10-CM | POA: Diagnosis not present

## 2020-12-11 DIAGNOSIS — M8580 Other specified disorders of bone density and structure, unspecified site: Secondary | ICD-10-CM | POA: Diagnosis not present

## 2020-12-11 DIAGNOSIS — G9340 Encephalopathy, unspecified: Secondary | ICD-10-CM | POA: Diagnosis not present

## 2020-12-11 DIAGNOSIS — E785 Hyperlipidemia, unspecified: Secondary | ICD-10-CM | POA: Diagnosis not present

## 2020-12-11 DIAGNOSIS — I5033 Acute on chronic diastolic (congestive) heart failure: Secondary | ICD-10-CM | POA: Diagnosis not present

## 2020-12-11 DIAGNOSIS — I728 Aneurysm of other specified arteries: Secondary | ICD-10-CM | POA: Diagnosis not present

## 2020-12-11 DIAGNOSIS — E1122 Type 2 diabetes mellitus with diabetic chronic kidney disease: Secondary | ICD-10-CM | POA: Diagnosis not present

## 2020-12-11 DIAGNOSIS — N189 Chronic kidney disease, unspecified: Secondary | ICD-10-CM | POA: Diagnosis not present

## 2020-12-11 DIAGNOSIS — Z955 Presence of coronary angioplasty implant and graft: Secondary | ICD-10-CM | POA: Diagnosis not present

## 2020-12-11 DIAGNOSIS — Z7984 Long term (current) use of oral hypoglycemic drugs: Secondary | ICD-10-CM | POA: Diagnosis not present

## 2020-12-11 NOTE — Chronic Care Management (AMB) (Addendum)
Chronic Care Management Pharmacy Assistant   Name: Rebecca Mcintyre  MRN: [id number] DOB: [birth year]  Reason for Encounter: General Adherence Call       Recent office visits:  07.12.2022 Burnis Medin, MD patient was seen for hospital follow up  Recent consult visits:  None  Hospital visits:  Medication Reconciliation was completed by comparing discharge summary, patient's EMR and Pharmacy list, and upon discussion with patient.  Admitted to the hospital on 07.05.2022 due to Acute encephalopathy. Discharge date was 07.08.2022. Discharged from [facility]?Medications Started at [facility] Discharge:?? -started the following medication  Oxycodone 5 MG Take 1 tablet (5 mg total) by mouth every 6 (six)hours as needed for severe pain.  Medication Changes at Hospital Discharge: -Changed none  Medications Discontinued at Hospital Discharge: -Stopped none  Medications that remain the same after Hospital Discharge:??  -All other medications will remain the same.    Medications: Outpatient Encounter Medications as of 12/11/2020  Medication Sig   acetaminophen (TYLENOL) 500 MG tablet Take 1,000 mg by mouth every 6 (six) hours as needed for mild pain.   albuterol (PROVENTIL) (2.5 MG/3ML) 0.083% nebulizer solution Take 3 mLs (2.5 mg total) by nebulization every 6 (six) hours as needed for wheezing or shortness of breath.   allopurinol (ZYLOPRIM) 100 MG tablet TAKE 1 TABLET BY MOUTH TWICE A DAY   aspirin 81 MG tablet Take 81 mg by mouth daily.   dapagliflozin propanediol (FARXIGA) 10 MG TABS tablet Take 1 tablet (10 mg total) by mouth daily before breakfast.   Fluticasone-Umeclidin-Vilant (TRELEGY ELLIPTA) 100-62.5-25 MCG/INH AEPB Inhale 1 puff into the lungs daily.   furosemide (LASIX) 40 MG tablet Take 1 tablet (40 mg total) by mouth daily.   metFORMIN (GLUCOPHAGE) 500 MG tablet Take 1 tablet (500 mg total) by mouth 2 (two) times daily with a meal. Increase to 2  twice a day with a meal.   metoprolol tartrate (LOPRESSOR) 25 MG tablet TAKE 1/2 TABLETS BY MOUTH 2 TIMES DAILY   montelukast (SINGULAIR) 10 MG tablet TAKE 1 TABLET (10 MG TOTAL) AT BEDTIME BY MOUTH.   nitroGLYCERIN (NITROSTAT) 0.4 MG SL tablet PLACE 1 TABLET UNDER THE TONGUE EVERY 5 MINUTES AS NEEDED.   oxyCODONE (OXY IR/ROXICODONE) 5 MG immediate release tablet Take 1 tablet (5 mg total) by mouth every 6 (six) hours as needed for severe pain.   potassium chloride (KLOR-CON) 10 MEQ tablet Take 1 tablet (10 mEq total) by mouth 2 (two) times daily.   PROAIR HFA 108 (90 Base) MCG/ACT inhaler TAKE 2 PUFFS BY MOUTH EVERY 6 HOURS AS NEEDED FOR WHEEZE OR SHORTNESS OF BREATH   rOPINIRole (REQUIP) 0.5 MG tablet 1 TAB AT BEDTIME FOR RESTLESS LEG   simvastatin (ZOCOR) 40 MG tablet Take 1 tablet (40 mg total) by mouth at bedtime.   No facility-administered encounter medications on file as of 12/11/2020.   I spoke with the patient about medication adherence and she states that she has been doing fair. She recently had an injury to her right foot. She states that she has been doing better since her discharge. She now has home Health assisting her twice a week. She did begin taking oxycodone upon discharge. She states she does not have any side effects from her current medications. She wanted to see if she could get some help with one of her diabetes medications for Farxiga. She states that this medication has helped with her blood sugars, but it is  becoming a little difficult to afford. She wanted to know if there was another medication in the same class or if she could get help with this one. There have been no other falls or injuries. The patient is scheduled to follow up with her PCP and August 2022. Her next CCM appointment was rescheduled to September 14th, 2022 at 11 AM. Her current pharmacy is [facility] [address] she does not have any issues.  Care Gaps: Zoster Vaccines TDAP COVID-19  Star Rating  Drug: Medication Dispensed Quantity Pharmacy  Simvastatin 40 mg 05.06.2022 [facility]  Metformin 500 mg 07.07.2022 [facility]   Any gaps in medications fill history?  Maia Breslow, [place] Pharmacist Assistant [phone]

## 2020-12-12 ENCOUNTER — Ambulatory Visit (HOSPITAL_COMMUNITY): Payer: Medicare Other

## 2020-12-12 ENCOUNTER — Telehealth: Payer: Medicare Other

## 2020-12-12 DIAGNOSIS — M1990 Unspecified osteoarthritis, unspecified site: Secondary | ICD-10-CM | POA: Diagnosis not present

## 2020-12-12 DIAGNOSIS — Z7982 Long term (current) use of aspirin: Secondary | ICD-10-CM | POA: Diagnosis not present

## 2020-12-12 DIAGNOSIS — I5033 Acute on chronic diastolic (congestive) heart failure: Secondary | ICD-10-CM | POA: Diagnosis not present

## 2020-12-12 DIAGNOSIS — E785 Hyperlipidemia, unspecified: Secondary | ICD-10-CM | POA: Diagnosis not present

## 2020-12-12 DIAGNOSIS — Z87891 Personal history of nicotine dependence: Secondary | ICD-10-CM | POA: Diagnosis not present

## 2020-12-12 DIAGNOSIS — J9621 Acute and chronic respiratory failure with hypoxia: Secondary | ICD-10-CM | POA: Diagnosis not present

## 2020-12-12 DIAGNOSIS — I2510 Atherosclerotic heart disease of native coronary artery without angina pectoris: Secondary | ICD-10-CM | POA: Diagnosis not present

## 2020-12-12 DIAGNOSIS — K219 Gastro-esophageal reflux disease without esophagitis: Secondary | ICD-10-CM | POA: Diagnosis not present

## 2020-12-12 DIAGNOSIS — I252 Old myocardial infarction: Secondary | ICD-10-CM | POA: Diagnosis not present

## 2020-12-12 DIAGNOSIS — E041 Nontoxic single thyroid nodule: Secondary | ICD-10-CM | POA: Diagnosis not present

## 2020-12-12 DIAGNOSIS — K7460 Unspecified cirrhosis of liver: Secondary | ICD-10-CM | POA: Diagnosis not present

## 2020-12-12 DIAGNOSIS — M8580 Other specified disorders of bone density and structure, unspecified site: Secondary | ICD-10-CM | POA: Diagnosis not present

## 2020-12-12 DIAGNOSIS — I728 Aneurysm of other specified arteries: Secondary | ICD-10-CM | POA: Diagnosis not present

## 2020-12-12 DIAGNOSIS — E1122 Type 2 diabetes mellitus with diabetic chronic kidney disease: Secondary | ICD-10-CM | POA: Diagnosis not present

## 2020-12-12 DIAGNOSIS — Z8673 Personal history of transient ischemic attack (TIA), and cerebral infarction without residual deficits: Secondary | ICD-10-CM | POA: Diagnosis not present

## 2020-12-12 DIAGNOSIS — J441 Chronic obstructive pulmonary disease with (acute) exacerbation: Secondary | ICD-10-CM | POA: Diagnosis not present

## 2020-12-12 DIAGNOSIS — S90921D Unspecified superficial injury of right foot, subsequent encounter: Secondary | ICD-10-CM | POA: Diagnosis not present

## 2020-12-12 DIAGNOSIS — M109 Gout, unspecified: Secondary | ICD-10-CM | POA: Diagnosis not present

## 2020-12-12 DIAGNOSIS — Z7984 Long term (current) use of oral hypoglycemic drugs: Secondary | ICD-10-CM | POA: Diagnosis not present

## 2020-12-12 DIAGNOSIS — Z955 Presence of coronary angioplasty implant and graft: Secondary | ICD-10-CM | POA: Diagnosis not present

## 2020-12-12 DIAGNOSIS — G9340 Encephalopathy, unspecified: Secondary | ICD-10-CM | POA: Diagnosis not present

## 2020-12-12 DIAGNOSIS — I130 Hypertensive heart and chronic kidney disease with heart failure and stage 1 through stage 4 chronic kidney disease, or unspecified chronic kidney disease: Secondary | ICD-10-CM | POA: Diagnosis not present

## 2020-12-12 DIAGNOSIS — Z9981 Dependence on supplemental oxygen: Secondary | ICD-10-CM | POA: Diagnosis not present

## 2020-12-12 DIAGNOSIS — N189 Chronic kidney disease, unspecified: Secondary | ICD-10-CM | POA: Diagnosis not present

## 2020-12-12 NOTE — Progress Notes (Deleted)
Chronic Care Management Pharmacy Note  12/12/2020 Name:  Rebecca Mcintyre MRN:  [id number] DOB:  07/03/1945  Summary: ***  Recommendations/Changes made from today's visit: ***  Plan: ***   Subjective: Rebecca Mcintyre is an 75 y.o. year old female who is a primary patient of Panosh, Standley Brooking, MD.  The CCM team was consulted for assistance with disease management and care coordination needs.    Engaged with patient by telephone for follow up visit in response to provider referral for pharmacy case management and/or care coordination services.   Consent to Services:  The patient was given information about Chronic Care Management services, agreed to services, and gave verbal consent prior to initiation of services.  Please see initial visit note for detailed documentation.   Patient Care Team: Panosh, Standley Brooking, MD as PCP - General Paula Compton, MD as Attending Physician (Obstetrics and Gynecology) Irene Shipper, MD as Attending Physician (Gastroenterology) Sharyne Peach, MD as Consulting Physician (Ophthalmology) Minus Breeding, MD as Consulting Physician (Cardiology) Eli Hose, [facility] (Inactive) as Pharmacist (Pharmacist) Rigoberto Noel, MD as Consulting Physician (Pulmonary Disease) Izora Gala, MD as Consulting Physician (Otolaryngology)  Recent office visits: 12/05/20 Shanon Ace, MD: Patient presented for hospitalization follow up. Plan for follow up in 2 weeks for wound reassessment.  Recent consult visits: [facility] visits: 7/5-12/01/20 Patient admitted for    Objective:  Lab Results  Component Value Date   CREATININE 0.95 12/01/2020   BUN 25 (H) 12/01/2020   GFR 72.80 10/03/2020   GFRNONAA >60 12/01/2020   GFRAA >60 11/12/2018   NA 136 12/01/2020   K 3.1 (L) 12/01/2020   CALCIUM 9.1 12/01/2020   CO2 37 (H) 12/01/2020   GLUCOSE 131 (H) 12/01/2020    Lab Results  Component Value Date/Time   HGBA1C 7.4 (H) 10/03/2020 09:42 AM   HGBA1C 10.1 (H)  03/27/2020 08:06 AM   GFR 72.80 10/03/2020 09:42 AM   GFR 75.32 03/27/2020 08:06 AM   MICROALBUR 2.8 (H) 03/27/2020 08:06 AM   MICROALBUR 0.7 04/02/2019 09:05 AM    Last diabetic Eye exam:  Lab Results  Component Value Date/Time   HMDIABEYEEXA No Retinopathy 05/04/2019 12:00 AM    Last diabetic Foot exam: No results found for: HMDIABFOOTEX   Lab Results  Component Value Date   CHOL 155 03/27/2020   HDL 36.50 (L) 03/27/2020   LDLCALC 89 03/27/2020   LDLDIRECT 116.0 02/05/2019   TRIG 148.0 03/27/2020   CHOLHDL 4 03/27/2020    Hepatic Function Latest Ref Rng & Units 11/28/2020 10/03/2020 03/27/2020  Total Protein 6.5 - 8.1 g/dL 6.3(L) 6.9 6.5  Albumin 3.5 - 5.0 g/dL 3.1(L) 4.0 3.8  AST 15 - 41 U/L 28 25 46(H)  ALT 0 - 44 U/L 25 22 34  Alk Phosphatase 38 - 126 U/L 67 80 95  Total Bilirubin 0.3 - 1.2 mg/dL 0.7 0.5 0.6  Bilirubin, Direct 0.0 - 0.3 mg/dL - 0.1 0.2    Lab Results  Component Value Date/Time   TSH 3.354 11/29/2020 12:03 AM   TSH 3.37 10/03/2020 09:42 AM   TSH 3.46 03/27/2020 08:06 AM   FREET4 0.81 10/03/2020 09:42 AM    CBC Latest Ref Rng & Units 11/28/2020 11/28/2020 10/03/2020  WBC 4.0 - 10.5 K/uL - 8.5 7.2  Hemoglobin 12.0 - 15.0 g/dL 13.6 12.1 14.6  Hematocrit 36.0 - 46.0 % 40.0 40.6 44.4  Platelets 150 - 400 K/uL - 248 218.0    No results found  for: VD25OH  Clinical ASCVD: {YES/NO:21197} The ASCVD Risk score Mikey Bussing DC Jr., et al., 2013) failed to calculate for the following reasons:   The patient has a prior MI or stroke diagnosis    Depression screen [facility] 2/9 03/22/2020 01/28/2019 03/10/2017  Decreased Interest 0 0 0  Down, Depressed, Hopeless 0 0 0  PHQ - 2 Score 0 0 0  Altered sleeping - 0 -  Tired, decreased energy - 0 -  Change in appetite - 0 -  Feeling bad or failure about yourself  - 0 -  Trouble concentrating - 0 -  Moving slowly or fidgety/restless - 0 -  Suicidal thoughts - 0 -  PHQ-9 Score - 0 -  Difficult doing work/chores - Not difficult  at all -  Some recent data might be hidden     ***Other: (CHADS2VASc if Afib, MMRC or CAT for COPD, ACT, DEXA)  Social History   Tobacco Use  Smoking Status Former   Packs/day: 1.00   Years: 39.00   Pack years: 39.00   Types: Cigarettes   Quit date: 11/04/2018   Years since quitting: 2.1  Smokeless Tobacco Never  Tobacco Comments   39   BP Readings from Last 3 Encounters:  12/05/20 116/60  12/01/20 (!) 105/56  11/24/20 109/61   Pulse Readings from Last 3 Encounters:  12/05/20 81  12/01/20 78  11/24/20 95   Wt Readings from Last 3 Encounters:  12/05/20 250 lb 14.1 oz (113.8 kg)  11/30/20 250 lb 14.1 oz (113.8 kg)  11/24/20 245 lb (111.1 kg)   BMI Readings from Last 3 Encounters:  12/05/20 44.44 kg/m  11/30/20 44.44 kg/m  11/24/20 43.40 kg/m    Assessment/Interventions: Review of patient past medical history, allergies, medications, health status, including review of consultants reports, laboratory and other test data, was performed as part of comprehensive evaluation and provision of chronic care management services.   SDOH:  (Social Determinants of Health) assessments and interventions performed: {yes/no:20286}  SDOH Screenings   Alcohol Screen: Not on file  Depression (PHQ2-9): Low Risk    PHQ-2 Score: 0  Financial Resource Strain: Low Risk    Difficulty of Paying Living Expenses: Not hard at all  Food Insecurity: No Food Insecurity   Worried About Charity fundraiser in the Last Year: Never true   Ran Out of Food in the Last Year: Never true  Housing: Low Risk    Last Housing Risk Score: 0  Physical Activity: Sufficiently Active   Days of Exercise per Week: 5 days   Minutes of Exercise per Session: 30 min  Social Connections: Moderately Integrated   Frequency of Communication with Friends and Family: More than three times a week   Frequency of Social Gatherings with Friends and Family: More than three times a week   Attends Religious Services: More  than 4 times per year   Active Member of [employer] or Organizations: Yes   Attends Archivist Meetings: 1 to 4 times per year   Marital Status: Divorced  Stress: No Stress Concern Present   Feeling of Stress : Not at all  Tobacco Use: Medium Risk   Smoking Tobacco Use: Former   Smokeless Tobacco Use: Never  Transportation Needs: No Data processing manager (Medical): No   Lack of Transportation (Non-Medical): No    CCM Care Plan  Allergies  Allergen Reactions   Atorvastatin     REACTION: myalgias  and liver    Medications  Reviewed Today     Reviewed by Nilda Riggs, CMA (Certified Medical Assistant) on 12/05/20 at [place] List Status: <None>   Medication Order Taking? Sig Documenting Provider Last Dose Status Informant  acetaminophen (TYLENOL) 500 MG tablet [id number] Yes Take 1,000 mg by mouth every 6 (six) hours as needed for mild pain. [provider] Taking Active Child  albuterol (PROVENTIL) (2.5 MG/3ML) 0.083% nebulizer solution [id number] Yes Take 3 mLs (2.5 mg total) by nebulization every 6 (six) hours as needed for wheezing or shortness of breath. Parrett, Fonnie Mu, NP Taking Active Child  allopurinol (ZYLOPRIM) 100 MG tablet [id number] Yes TAKE 1 TABLET BY MOUTH TWICE A DAY Panosh, Standley Brooking, MD Taking Active Child  aspirin 81 MG tablet [id number] Yes Take 81 mg by mouth daily. [provider] Taking Active Child  dapagliflozin propanediol (FARXIGA) 10 MG TABS tablet [id number] Yes Take 1 tablet (10 mg total) by mouth daily before breakfast. Panosh, Standley Brooking, MD Taking Active Child  Fluticasone-Umeclidin-Vilant (TRELEGY ELLIPTA) 100-62.5-25 MCG/INH AEPB [id number] Yes Inhale 1 puff into the lungs daily. Rigoberto Noel, MD Taking Active Child  furosemide (LASIX) 40 MG tablet [id number] Yes Take 1 tablet (40 mg total) by mouth daily. Panosh, Standley Brooking, MD Taking Active Child  metFORMIN (GLUCOPHAGE) 500 MG tablet [id number] Yes Take 1 tablet  (500 mg total) by mouth 2 (two) times daily with a meal. Increase to 2 twice a day with a meal. Panosh, Standley Brooking, MD Taking Active Child  metoprolol tartrate (LOPRESSOR) 25 MG tablet [id number] Yes TAKE 1/2 TABLETS BY MOUTH 2 TIMES DAILY Panosh, Standley Brooking, MD Taking Active Child  montelukast (SINGULAIR) 10 MG tablet [id number] Yes TAKE 1 TABLET (10 MG TOTAL) AT BEDTIME BY MOUTH. Panosh, Standley Brooking, MD Taking Active Child  nitroGLYCERIN (NITROSTAT) 0.4 MG SL tablet [id number] Yes PLACE 1 TABLET UNDER THE TONGUE EVERY 5 MINUTES AS NEEDED. Panosh, Standley Brooking, MD Taking Active Child  oxyCODONE (OXY IR/ROXICODONE) 5 MG immediate release tablet [id number] Yes Take 1 tablet (5 mg total) by mouth every 6 (six) hours as needed for severe pain. Jonetta Osgood, MD Taking Active   potassium chloride (KLOR-CON) 10 MEQ tablet [id number] Yes Take 1 tablet (10 mEq total) by mouth 2 (two) times daily. Burnis Medin, MD Taking Active Child  PROAIR HFA [phone] Base) MCG/ACT inhaler [id number] Yes TAKE 2 PUFFS BY MOUTH EVERY 6 HOURS AS NEEDED FOR WHEEZE OR SHORTNESS OF Willia Craze, MD Taking Active Child  rOPINIRole (REQUIP) 0.5 MG tablet [id number] Yes 1 TAB AT BEDTIME FOR RESTLESS LEG Rigoberto Noel, MD Taking Active Child  simvastatin (ZOCOR) 40 MG tablet [id number] Yes Take 1 tablet (40 mg total) by mouth at bedtime. Panosh, Standley Brooking, MD Taking Active Child            Patient Active Problem List   Diagnosis Date Noted   Acute encephalopathy 11/28/2020   Elevated troponin 11/28/2020   Anterior communicating artery aneurysm 11/28/2020   Thyroid nodule 11/28/2020   Contusion of right foot    Educated about COVID-19 virus infection 08/04/2019   COPD (chronic obstructive pulmonary disease) ([place]) 12/29/2018   Restless leg syndrome 12/22/2018   Congestive heart failure ([place]) 12/13/2018   Acute on chronic respiratory failure with hypoxia and hypercapnia (HCC) 11/05/2018   Acute on chronic diastolic CHF (congestive  heart failure) ([place]) 11/05/2018   Acute CHF (congestive heart failure) ([place]) 11/04/2018   CHF (congestive heart failure) ([place]) 11/04/2018  Diabetes mellitus type II, non insulin dependent ([place]) 11/04/2018   Great toe pain, left 01/01/2018   Myocardial infarction [facility])    Elevated hemoglobin ([place]) 04/24/2014   Visit for preventive health examination 04/24/2014   Pre-diabetes 08/12/2012   Decreased hearing 04/12/2012   Welcome to Medicare preventive visit 04/08/2012   HYPERURICEMIA 03/15/2009   GOUT 12/13/2008   OBESITY 08/23/2008   OSTEOPENIA 09/01/2007   MYOCARDIAL INFARCTION, HX OF 07/14/2007   SOLITARY KIDNEY 07/14/2007   Hyperlipidemia 04/15/2007   Essential hypertension 04/15/2007   CAD (coronary artery disease) 04/15/2007   OTHER ABNORMAL GLUCOSE 04/15/2007   TOBACCO ABUSE, HX OF 04/15/2007    Immunization History  Administered Date(s) Administered   Fluad Quad(high Dose 65+) 02/11/2019, 02/29/2020   Influenza Split 04/29/2011, 04/08/2012   Influenza Whole 02/16/2008, 03/15/2009, 03/20/2010   Influenza, High Dose Seasonal PF 04/14/2013, 02/14/2015, 02/28/2016, 03/10/2017, 03/25/2018   Influenza,inj,Quad PF,6+ Mos 04/18/2014   Influenza,inj,quad, With Preservative 02/25/2019   Influenza-Unspecified 02/24/2017   PFIZER(Purple Top)SARS-COV-2 Vaccination 06/15/2019, 07/06/2019, 04/13/2020   Pneumococcal Conjugate-13 04/08/2012   Pneumococcal Polysaccharide-23 04/14/2013   Td 09/14/2007   Zoster, Live 09/14/2007    Conditions to be addressed/monitored:  {USCCMDZASSESSMENTOPTIONS:23563}  There are no care plans that you recently modified to display for this patient.    Medication Assistance: {MEDASSISTANCEINFO:25044}  Compliance/Adherence/Medication fill history: Care Gaps: Shingrix, tetanus, COVID booster  Star-Rating Drugs: Simvastatin 40 mg - [facility] Metformin 500 mg - last [facility]/30/20 [facility]  [id number] [address] [place]: [phone]: [phone] Uses pill box? {Yes or If no, why not?:20788} Pt endorses ***% compliance  We discussed: {Pharmacy options:24294} Patient decided to: {US Pharmacy PTSVX:79390} Care Plan and Follow Up Patient Decision:  {FOLLOWUP:24991}  Plan: {CM FOLLOW UP PZESP:23300} MJeni Salles PharmD, BNavy Yard CityPharmacist [place]

## 2020-12-13 ENCOUNTER — Inpatient Hospital Stay: Payer: Medicare Other | Admitting: Internal Medicine

## 2020-12-13 DIAGNOSIS — S91311A Laceration without foreign body, right foot, initial encounter: Secondary | ICD-10-CM | POA: Diagnosis not present

## 2020-12-14 ENCOUNTER — Ambulatory Visit (HOSPITAL_COMMUNITY): Payer: Medicare Other

## 2020-12-15 DIAGNOSIS — J441 Chronic obstructive pulmonary disease with (acute) exacerbation: Secondary | ICD-10-CM | POA: Diagnosis not present

## 2020-12-15 DIAGNOSIS — G9340 Encephalopathy, unspecified: Secondary | ICD-10-CM | POA: Diagnosis not present

## 2020-12-15 DIAGNOSIS — Z8673 Personal history of transient ischemic attack (TIA), and cerebral infarction without residual deficits: Secondary | ICD-10-CM | POA: Diagnosis not present

## 2020-12-15 DIAGNOSIS — Z87891 Personal history of nicotine dependence: Secondary | ICD-10-CM | POA: Diagnosis not present

## 2020-12-15 DIAGNOSIS — E785 Hyperlipidemia, unspecified: Secondary | ICD-10-CM | POA: Diagnosis not present

## 2020-12-15 DIAGNOSIS — I728 Aneurysm of other specified arteries: Secondary | ICD-10-CM | POA: Diagnosis not present

## 2020-12-15 DIAGNOSIS — E1122 Type 2 diabetes mellitus with diabetic chronic kidney disease: Secondary | ICD-10-CM | POA: Diagnosis not present

## 2020-12-15 DIAGNOSIS — Z9981 Dependence on supplemental oxygen: Secondary | ICD-10-CM | POA: Diagnosis not present

## 2020-12-15 DIAGNOSIS — M1990 Unspecified osteoarthritis, unspecified site: Secondary | ICD-10-CM | POA: Diagnosis not present

## 2020-12-15 DIAGNOSIS — M109 Gout, unspecified: Secondary | ICD-10-CM | POA: Diagnosis not present

## 2020-12-15 DIAGNOSIS — S90921D Unspecified superficial injury of right foot, subsequent encounter: Secondary | ICD-10-CM | POA: Diagnosis not present

## 2020-12-15 DIAGNOSIS — N189 Chronic kidney disease, unspecified: Secondary | ICD-10-CM | POA: Diagnosis not present

## 2020-12-15 DIAGNOSIS — Z7984 Long term (current) use of oral hypoglycemic drugs: Secondary | ICD-10-CM | POA: Diagnosis not present

## 2020-12-15 DIAGNOSIS — I5033 Acute on chronic diastolic (congestive) heart failure: Secondary | ICD-10-CM | POA: Diagnosis not present

## 2020-12-15 DIAGNOSIS — M8580 Other specified disorders of bone density and structure, unspecified site: Secondary | ICD-10-CM | POA: Diagnosis not present

## 2020-12-15 DIAGNOSIS — Z7982 Long term (current) use of aspirin: Secondary | ICD-10-CM | POA: Diagnosis not present

## 2020-12-15 DIAGNOSIS — Z955 Presence of coronary angioplasty implant and graft: Secondary | ICD-10-CM | POA: Diagnosis not present

## 2020-12-15 DIAGNOSIS — J9621 Acute and chronic respiratory failure with hypoxia: Secondary | ICD-10-CM | POA: Diagnosis not present

## 2020-12-15 DIAGNOSIS — I2510 Atherosclerotic heart disease of native coronary artery without angina pectoris: Secondary | ICD-10-CM | POA: Diagnosis not present

## 2020-12-15 DIAGNOSIS — I252 Old myocardial infarction: Secondary | ICD-10-CM | POA: Diagnosis not present

## 2020-12-15 DIAGNOSIS — K219 Gastro-esophageal reflux disease without esophagitis: Secondary | ICD-10-CM | POA: Diagnosis not present

## 2020-12-15 DIAGNOSIS — I130 Hypertensive heart and chronic kidney disease with heart failure and stage 1 through stage 4 chronic kidney disease, or unspecified chronic kidney disease: Secondary | ICD-10-CM | POA: Diagnosis not present

## 2020-12-15 DIAGNOSIS — E041 Nontoxic single thyroid nodule: Secondary | ICD-10-CM | POA: Diagnosis not present

## 2020-12-15 DIAGNOSIS — K7460 Unspecified cirrhosis of liver: Secondary | ICD-10-CM | POA: Diagnosis not present

## 2020-12-19 ENCOUNTER — Ambulatory Visit (HOSPITAL_COMMUNITY): Payer: Medicare Other

## 2020-12-19 DIAGNOSIS — I252 Old myocardial infarction: Secondary | ICD-10-CM | POA: Diagnosis not present

## 2020-12-19 DIAGNOSIS — K7460 Unspecified cirrhosis of liver: Secondary | ICD-10-CM | POA: Diagnosis not present

## 2020-12-19 DIAGNOSIS — Z7982 Long term (current) use of aspirin: Secondary | ICD-10-CM | POA: Diagnosis not present

## 2020-12-19 DIAGNOSIS — M8580 Other specified disorders of bone density and structure, unspecified site: Secondary | ICD-10-CM | POA: Diagnosis not present

## 2020-12-19 DIAGNOSIS — Z955 Presence of coronary angioplasty implant and graft: Secondary | ICD-10-CM | POA: Diagnosis not present

## 2020-12-19 DIAGNOSIS — J441 Chronic obstructive pulmonary disease with (acute) exacerbation: Secondary | ICD-10-CM | POA: Diagnosis not present

## 2020-12-19 DIAGNOSIS — M109 Gout, unspecified: Secondary | ICD-10-CM | POA: Diagnosis not present

## 2020-12-19 DIAGNOSIS — E1122 Type 2 diabetes mellitus with diabetic chronic kidney disease: Secondary | ICD-10-CM | POA: Diagnosis not present

## 2020-12-19 DIAGNOSIS — Z9981 Dependence on supplemental oxygen: Secondary | ICD-10-CM | POA: Diagnosis not present

## 2020-12-19 DIAGNOSIS — J9621 Acute and chronic respiratory failure with hypoxia: Secondary | ICD-10-CM | POA: Diagnosis not present

## 2020-12-19 DIAGNOSIS — Z87891 Personal history of nicotine dependence: Secondary | ICD-10-CM | POA: Diagnosis not present

## 2020-12-19 DIAGNOSIS — I728 Aneurysm of other specified arteries: Secondary | ICD-10-CM | POA: Diagnosis not present

## 2020-12-19 DIAGNOSIS — Z8673 Personal history of transient ischemic attack (TIA), and cerebral infarction without residual deficits: Secondary | ICD-10-CM | POA: Diagnosis not present

## 2020-12-19 DIAGNOSIS — S90921D Unspecified superficial injury of right foot, subsequent encounter: Secondary | ICD-10-CM | POA: Diagnosis not present

## 2020-12-19 DIAGNOSIS — K219 Gastro-esophageal reflux disease without esophagitis: Secondary | ICD-10-CM | POA: Diagnosis not present

## 2020-12-19 DIAGNOSIS — I2510 Atherosclerotic heart disease of native coronary artery without angina pectoris: Secondary | ICD-10-CM | POA: Diagnosis not present

## 2020-12-19 DIAGNOSIS — M1990 Unspecified osteoarthritis, unspecified site: Secondary | ICD-10-CM | POA: Diagnosis not present

## 2020-12-19 DIAGNOSIS — I5033 Acute on chronic diastolic (congestive) heart failure: Secondary | ICD-10-CM | POA: Diagnosis not present

## 2020-12-19 DIAGNOSIS — Z7984 Long term (current) use of oral hypoglycemic drugs: Secondary | ICD-10-CM | POA: Diagnosis not present

## 2020-12-19 DIAGNOSIS — E785 Hyperlipidemia, unspecified: Secondary | ICD-10-CM | POA: Diagnosis not present

## 2020-12-19 DIAGNOSIS — E041 Nontoxic single thyroid nodule: Secondary | ICD-10-CM | POA: Diagnosis not present

## 2020-12-19 DIAGNOSIS — N189 Chronic kidney disease, unspecified: Secondary | ICD-10-CM | POA: Diagnosis not present

## 2020-12-19 DIAGNOSIS — I130 Hypertensive heart and chronic kidney disease with heart failure and stage 1 through stage 4 chronic kidney disease, or unspecified chronic kidney disease: Secondary | ICD-10-CM | POA: Diagnosis not present

## 2020-12-19 DIAGNOSIS — G9340 Encephalopathy, unspecified: Secondary | ICD-10-CM | POA: Diagnosis not present

## 2020-12-19 NOTE — Telephone Encounter (Signed)
Pictures looks like this is healing quite well. How is her pain doing? How often is she needing to take narcotic pain medicine?

## 2020-12-21 ENCOUNTER — Ambulatory Visit (HOSPITAL_COMMUNITY): Payer: Medicare Other

## 2020-12-21 NOTE — Telephone Encounter (Signed)
So glad things are better Yes please cancel the wound clinic appointment. Forwarding question about Wilder Glade to [facility] prior our pharmacist. See if she is able to give any help.

## 2020-12-22 DIAGNOSIS — Z9981 Dependence on supplemental oxygen: Secondary | ICD-10-CM | POA: Diagnosis not present

## 2020-12-22 DIAGNOSIS — I252 Old myocardial infarction: Secondary | ICD-10-CM | POA: Diagnosis not present

## 2020-12-22 DIAGNOSIS — E1122 Type 2 diabetes mellitus with diabetic chronic kidney disease: Secondary | ICD-10-CM | POA: Diagnosis not present

## 2020-12-22 DIAGNOSIS — I5033 Acute on chronic diastolic (congestive) heart failure: Secondary | ICD-10-CM | POA: Diagnosis not present

## 2020-12-22 DIAGNOSIS — Z87891 Personal history of nicotine dependence: Secondary | ICD-10-CM | POA: Diagnosis not present

## 2020-12-22 DIAGNOSIS — K219 Gastro-esophageal reflux disease without esophagitis: Secondary | ICD-10-CM | POA: Diagnosis not present

## 2020-12-22 DIAGNOSIS — I728 Aneurysm of other specified arteries: Secondary | ICD-10-CM | POA: Diagnosis not present

## 2020-12-22 DIAGNOSIS — S90921D Unspecified superficial injury of right foot, subsequent encounter: Secondary | ICD-10-CM | POA: Diagnosis not present

## 2020-12-22 DIAGNOSIS — I130 Hypertensive heart and chronic kidney disease with heart failure and stage 1 through stage 4 chronic kidney disease, or unspecified chronic kidney disease: Secondary | ICD-10-CM | POA: Diagnosis not present

## 2020-12-22 DIAGNOSIS — M8580 Other specified disorders of bone density and structure, unspecified site: Secondary | ICD-10-CM | POA: Diagnosis not present

## 2020-12-22 DIAGNOSIS — J9621 Acute and chronic respiratory failure with hypoxia: Secondary | ICD-10-CM | POA: Diagnosis not present

## 2020-12-22 DIAGNOSIS — G9340 Encephalopathy, unspecified: Secondary | ICD-10-CM | POA: Diagnosis not present

## 2020-12-22 DIAGNOSIS — J441 Chronic obstructive pulmonary disease with (acute) exacerbation: Secondary | ICD-10-CM | POA: Diagnosis not present

## 2020-12-22 DIAGNOSIS — M1990 Unspecified osteoarthritis, unspecified site: Secondary | ICD-10-CM | POA: Diagnosis not present

## 2020-12-22 DIAGNOSIS — E041 Nontoxic single thyroid nodule: Secondary | ICD-10-CM | POA: Diagnosis not present

## 2020-12-22 DIAGNOSIS — Z7982 Long term (current) use of aspirin: Secondary | ICD-10-CM | POA: Diagnosis not present

## 2020-12-22 DIAGNOSIS — Z7984 Long term (current) use of oral hypoglycemic drugs: Secondary | ICD-10-CM | POA: Diagnosis not present

## 2020-12-22 DIAGNOSIS — I2510 Atherosclerotic heart disease of native coronary artery without angina pectoris: Secondary | ICD-10-CM | POA: Diagnosis not present

## 2020-12-22 DIAGNOSIS — M109 Gout, unspecified: Secondary | ICD-10-CM | POA: Diagnosis not present

## 2020-12-22 DIAGNOSIS — Z955 Presence of coronary angioplasty implant and graft: Secondary | ICD-10-CM | POA: Diagnosis not present

## 2020-12-22 DIAGNOSIS — E785 Hyperlipidemia, unspecified: Secondary | ICD-10-CM | POA: Diagnosis not present

## 2020-12-22 DIAGNOSIS — N189 Chronic kidney disease, unspecified: Secondary | ICD-10-CM | POA: Diagnosis not present

## 2020-12-22 DIAGNOSIS — K7460 Unspecified cirrhosis of liver: Secondary | ICD-10-CM | POA: Diagnosis not present

## 2020-12-22 DIAGNOSIS — Z8673 Personal history of transient ischemic attack (TIA), and cerebral infarction without residual deficits: Secondary | ICD-10-CM | POA: Diagnosis not present

## 2020-12-24 NOTE — Progress Notes (Addendum)
Chief Complaint  Patient presents with   Follow-up    HPI: Rebecca Mcintyre 75 y.o. come in for FU  with daughter  Rebecca Mcintyre  Wound injury much better  up and around now  mid wound pain today but  no swelling   dc  Resp  still ok much better since hosp   lasix once a day .    And kcle 10 x 2 per day  pulse ox up to 94  Cv No change  BG  114  and 120 or less.  On farxiga    ( cost an issue  not heard yet if any other options)  Has fu cards next week  Check r ear popping  has tube in that ear  no pain fever  ROS: See pertinent positives and negatives per HPI. No cough alsk about booster    Past Medical History:  Diagnosis Date   Allergy    ASCUS on Pap smear    had repeat x 2 normal with [name] Plan 2011   Coronary artery disease    Gout    Hyperlipidemia    Hypertension    Myocardial infarction [facility])    2002 95% proximal LAD stenosis with thrombus followed by 90% stenosis, the first diagonal 80% stenosis, circumflex 30% stenosis, circumflex obtuse marginal subbranch 70% stenosis, PDA 40% stenosis. She had a drug-eluting stent placement with a Pixel stent   Osteopenia    Solitary kidney     Family History  Problem Relation Age of Onset   Breast cancer Mother 83   Lung cancer Father    Heart attack Brother        Died age 86 MI   Colon cancer Brother    Heart disease Daughter 30       smoker and overweight    Esophageal cancer Neg Hx    Pancreatic cancer Neg Hx    Rectal cancer Neg Hx    Stomach cancer Neg Hx     Social History   Socioeconomic History   Marital status: Single    Spouse name: Not on file   Number of children: 2   Years of education: Not on file   Highest education level: Not on file  Occupational History   Occupation: semi retired    Fish farm manager: Clinical research associate  Tobacco Use   Smoking status: Former    Packs/day: 1.00    Years: 39.00    Pack years: 39.00    Types: Cigarettes    Quit date: 11/04/2018    Years since quitting: 2.1   Smokeless tobacco:  Never   Tobacco comments:    73  Vaping Use   Vaping Use: Never used  Substance and Sexual Activity   Alcohol use: Yes    Comment: 1-2 per month   Drug use: No   Sexual activity: Not on file  Other Topics Concern   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    No pets      Social Determinants of Radio broadcast assistant Strain: Low Risk    Difficulty of Paying Living Expenses: Not hard at all  Food Insecurity: No Food Insecurity   Worried About Charity fundraiser in the Last Year: Never true   Arboriculturist in the Last Year: Never  true  Transportation Needs: No Transportation Needs   Lack of Transportation (Medical): No   Lack of Transportation (Non-Medical): No  Physical Activity: Sufficiently Active   Days of Exercise per Week: 5 days   Minutes of Exercise per Session: 30 min  Stress: No Stress Concern Present   Feeling of Stress : Not at all  Social Connections: Moderately Integrated   Frequency of Communication with Friends and Family: More than three times a week   Frequency of Social Gatherings with Friends and Family: More than three times a week   Attends Religious Services: More than 4 times per year   Active Member of [employer] or Organizations: Yes   Attends Archivist Meetings: 1 to 4 times per year   Marital Status: Divorced    Outpatient Medications Prior to Visit  Medication Sig Dispense Refill   acetaminophen (TYLENOL) 500 MG tablet Take 1,000 mg by mouth every 6 (six) hours as needed for mild pain.     albuterol (PROVENTIL) (2.5 MG/3ML) 0.083% nebulizer solution Take 3 mLs (2.5 mg total) by nebulization every 6 (six) hours as needed for wheezing or shortness of breath. 300 mL 11   allopurinol (ZYLOPRIM) 100 MG tablet TAKE 1 TABLET BY MOUTH TWICE A DAY 180 tablet 0   aspirin 81 MG tablet Take 81 mg by mouth daily.      dapagliflozin propanediol (FARXIGA) 10 MG TABS tablet Take 1 tablet (10 mg total) by mouth daily before breakfast. 90 tablet 3   Fluticasone-Umeclidin-Vilant (TRELEGY ELLIPTA) 100-62.5-25 MCG/INH AEPB Inhale 1 puff into the lungs daily. 60 each 3   furosemide (LASIX) 40 MG tablet Take 1 tablet (40 mg total) by mouth daily. 90 tablet 1   metFORMIN (GLUCOPHAGE) 500 MG tablet Take 1 tablet (500 mg total) by mouth 2 (two) times daily with a meal. Increase to 2 twice a day with a meal. 360 tablet 0   metoprolol tartrate (LOPRESSOR) 25 MG tablet TAKE 1/2 TABLETS BY MOUTH 2 TIMES DAILY 90 tablet 1   montelukast (SINGULAIR) 10 MG tablet TAKE 1 TABLET (10 MG TOTAL) AT BEDTIME BY MOUTH. 90 tablet 0   nitroGLYCERIN (NITROSTAT) 0.4 MG SL tablet PLACE 1 TABLET UNDER THE TONGUE EVERY 5 MINUTES AS NEEDED. 25 tablet 1   oxyCODONE (OXY IR/ROXICODONE) 5 MG immediate release tablet Take 1 tablet (5 mg total) by mouth every 6 (six) hours as needed for severe pain. 20 tablet 0   potassium chloride (KLOR-CON) 10 MEQ tablet Take 1 tablet (10 mEq total) by mouth 2 (two) times daily. 60 tablet 5   PROAIR HFA 108 (90 Base) MCG/ACT inhaler TAKE 2 PUFFS BY MOUTH EVERY 6 HOURS AS NEEDED FOR WHEEZE OR SHORTNESS OF BREATH 8.5 each 3   rOPINIRole (REQUIP) 0.5 MG tablet 1 TAB AT BEDTIME FOR RESTLESS LEG 90 tablet 0   simvastatin (ZOCOR) 40 MG tablet Take 1 tablet (40 mg total) by mouth at bedtime. 90 tablet 3   No facility-administered medications prior to visit.     EXAM:  BP 116/70 (BP Location: Left Arm, Patient Position: Sitting, Cuff Size: Large)   Pulse 87   Temp 97.6 F (36.4 C) (Oral)   Ht _0  (1.6 m)   Wt 246 lb 9.6 oz (111.9 kg)   SpO2 94%   BMI 43.68 kg/m   Body mass index is 43.68 kg/m.  GENERAL: vitals reviewed and listed above, alert, oriented, appears well hydrated and in no acute distress O2  no dyspnea and  nl  color  HEENT: atraumatic, conjunctiva  clear, no obvious abnormalities on  inspection of external nose and ears OP : masked 1/2  R ear   dark wax tm clear but cannot see tube.   LUNGS: clear to auscultation bilaterally, no wheezes, rales or rhonchi, CV: HRRR, no clubbing cyanosis nl cap refill  slight edema   MS: moves all extremities without noticeable focal  abnormality  right le  healing large abraded rea  mild edema no streaking or exudate.  PSYCH: pleasant and cooperative, no obvious depression or anxiety   Lab Results  Component Value Date   WBC 8.5 11/28/2020   HGB 13.6 11/28/2020   HCT 40.0 11/28/2020   PLT 248 11/28/2020   GLUCOSE 131 (H) 12/01/2020   CHOL 155 03/27/2020   TRIG 148.0 03/27/2020   HDL 36.50 (L) 03/27/2020   LDLDIRECT 116.0 02/05/2019   LDLCALC 89 03/27/2020   ALT 25 11/28/2020   AST 28 11/28/2020   NA 136 12/01/2020   K 3.1 (L) 12/01/2020   CL 90 (L) 12/01/2020   CREATININE 0.95 12/01/2020   BUN 25 (H) 12/01/2020   CO2 37 (H) 12/01/2020   TSH 3.354 11/29/2020   INR 1.0 11/28/2020   HGBA1C 7.4 (H) 10/03/2020   MICROALBUR 2.8 (H) 03/27/2020   BP Readings from Last 3 Encounters:  12/25/20 116/70  12/05/20 116/60  12/01/20 (!) 105/56    ASSESSMENT AND PLAN:  Discussed the following assessment and plan:  Type 2 diabetes mellitus with hyperglycemia, without long-term current use of insulin (HCC) - Plan: Basic metabolic panel, Magnesium, Magnesium, Basic metabolic panel  Medication management - Plan: Basic metabolic panel, Magnesium, Magnesium, Basic metabolic panel  Thyroid nodule - incidental on imaging will order thyroid US  - Plan: US THYROID  Hypokalemia - Plan: Basic metabolic panel, Magnesium, Magnesium, Basic metabolic panel  Wound of right foot - improving , local care plan  Essential hypertension  Centrilobular emphysema (HCC) - Plan: B Nat Peptide, Basic Metabolic Panel (BMET)  Ear symptom - prob needs to see ent  wax occluded tube? Doing much better   recheck potassium level  today .  Continue healing    R ear  wax at tube.   Can see ent if ongoing  -Patient advised to return or notify health care team  if  new concerns arise.  Patient Instructions  Glad  you are doing better.  Checking potassium today  Will reach out about   cost of farxiga .    Follow up depending    Will be contacted about   getting thyroid ultrasound.    Standley Brooking. Launi Asencio M.D.

## 2020-12-25 ENCOUNTER — Other Ambulatory Visit: Payer: Self-pay

## 2020-12-25 ENCOUNTER — Ambulatory Visit (INDEPENDENT_AMBULATORY_CARE_PROVIDER_SITE_OTHER): Payer: Medicare Other | Admitting: Internal Medicine

## 2020-12-25 ENCOUNTER — Encounter: Payer: Self-pay | Admitting: Internal Medicine

## 2020-12-25 VITALS — BP 116/70 | HR 87 | Temp 97.6°F | Ht 63.0 in | Wt 246.6 lb

## 2020-12-25 DIAGNOSIS — S91301A Unspecified open wound, right foot, initial encounter: Secondary | ICD-10-CM | POA: Diagnosis not present

## 2020-12-25 DIAGNOSIS — R6889 Other general symptoms and signs: Secondary | ICD-10-CM | POA: Diagnosis not present

## 2020-12-25 DIAGNOSIS — E1165 Type 2 diabetes mellitus with hyperglycemia: Secondary | ICD-10-CM

## 2020-12-25 DIAGNOSIS — Z79899 Other long term (current) drug therapy: Secondary | ICD-10-CM

## 2020-12-25 DIAGNOSIS — J432 Centrilobular emphysema: Secondary | ICD-10-CM

## 2020-12-25 DIAGNOSIS — E876 Hypokalemia: Secondary | ICD-10-CM | POA: Diagnosis not present

## 2020-12-25 DIAGNOSIS — I10 Essential (primary) hypertension: Secondary | ICD-10-CM

## 2020-12-25 DIAGNOSIS — E041 Nontoxic single thyroid nodule: Secondary | ICD-10-CM

## 2020-12-25 LAB — BASIC METABOLIC PANEL
BUN: 24 mg/dL — ABNORMAL HIGH (ref 6–23)
CO2: 31 mEq/L (ref 19–32)
Calcium: 9.6 mg/dL (ref 8.4–10.5)
Chloride: 99 mEq/L (ref 96–112)
Creatinine, Ser: 0.96 mg/dL (ref 0.40–1.20)
GFR: 58.4 mL/min — ABNORMAL LOW (ref >60.00)
Glucose, Bld: 123 mg/dL — ABNORMAL HIGH (ref 70–99)
Potassium: 3.8 mEq/L (ref 3.5–5.1)
Sodium: 141 mEq/L (ref 135–145)

## 2020-12-25 LAB — BRAIN NATRIURETIC PEPTIDE: Pro B Natriuretic peptide (BNP): 285 pg/mL — ABNORMAL HIGH (ref 0.0–100.0)

## 2020-12-25 LAB — MAGNESIUM: Magnesium: 1.9 mg/dL (ref 1.5–2.5)

## 2020-12-25 MED ORDER — KETOCONAZOLE 2 % EX CREA: 1.0000 "application " | TOPICAL_CREAM | Freq: Two times a day (BID) | CUTANEOUS | 0 refills | Status: DC

## 2020-12-25 NOTE — Patient Instructions (Addendum)
Glad  you are doing better.  Checking potassium today  Will reach out about   cost of farxiga .    Follow up depending    Will be contacted about   getting thyroid ultrasound.

## 2020-12-26 ENCOUNTER — Telehealth: Payer: Self-pay | Admitting: Pharmacist

## 2020-12-26 ENCOUNTER — Ambulatory Visit (HOSPITAL_COMMUNITY): Payer: Medicare Other

## 2020-12-26 DIAGNOSIS — E785 Hyperlipidemia, unspecified: Secondary | ICD-10-CM | POA: Diagnosis not present

## 2020-12-26 DIAGNOSIS — M109 Gout, unspecified: Secondary | ICD-10-CM | POA: Diagnosis not present

## 2020-12-26 DIAGNOSIS — Z8673 Personal history of transient ischemic attack (TIA), and cerebral infarction without residual deficits: Secondary | ICD-10-CM | POA: Diagnosis not present

## 2020-12-26 DIAGNOSIS — I252 Old myocardial infarction: Secondary | ICD-10-CM | POA: Diagnosis not present

## 2020-12-26 DIAGNOSIS — G9340 Encephalopathy, unspecified: Secondary | ICD-10-CM | POA: Diagnosis not present

## 2020-12-26 DIAGNOSIS — J441 Chronic obstructive pulmonary disease with (acute) exacerbation: Secondary | ICD-10-CM | POA: Diagnosis not present

## 2020-12-26 DIAGNOSIS — Z955 Presence of coronary angioplasty implant and graft: Secondary | ICD-10-CM | POA: Diagnosis not present

## 2020-12-26 DIAGNOSIS — Z87891 Personal history of nicotine dependence: Secondary | ICD-10-CM | POA: Diagnosis not present

## 2020-12-26 DIAGNOSIS — E1122 Type 2 diabetes mellitus with diabetic chronic kidney disease: Secondary | ICD-10-CM | POA: Diagnosis not present

## 2020-12-26 DIAGNOSIS — M1990 Unspecified osteoarthritis, unspecified site: Secondary | ICD-10-CM | POA: Diagnosis not present

## 2020-12-26 DIAGNOSIS — I130 Hypertensive heart and chronic kidney disease with heart failure and stage 1 through stage 4 chronic kidney disease, or unspecified chronic kidney disease: Secondary | ICD-10-CM | POA: Diagnosis not present

## 2020-12-26 DIAGNOSIS — M8580 Other specified disorders of bone density and structure, unspecified site: Secondary | ICD-10-CM | POA: Diagnosis not present

## 2020-12-26 DIAGNOSIS — Z7984 Long term (current) use of oral hypoglycemic drugs: Secondary | ICD-10-CM | POA: Diagnosis not present

## 2020-12-26 DIAGNOSIS — N189 Chronic kidney disease, unspecified: Secondary | ICD-10-CM | POA: Diagnosis not present

## 2020-12-26 DIAGNOSIS — K219 Gastro-esophageal reflux disease without esophagitis: Secondary | ICD-10-CM | POA: Diagnosis not present

## 2020-12-26 DIAGNOSIS — S90921D Unspecified superficial injury of right foot, subsequent encounter: Secondary | ICD-10-CM | POA: Diagnosis not present

## 2020-12-26 DIAGNOSIS — K7460 Unspecified cirrhosis of liver: Secondary | ICD-10-CM | POA: Diagnosis not present

## 2020-12-26 DIAGNOSIS — I5033 Acute on chronic diastolic (congestive) heart failure: Secondary | ICD-10-CM | POA: Diagnosis not present

## 2020-12-26 DIAGNOSIS — J9621 Acute and chronic respiratory failure with hypoxia: Secondary | ICD-10-CM | POA: Diagnosis not present

## 2020-12-26 DIAGNOSIS — Z9981 Dependence on supplemental oxygen: Secondary | ICD-10-CM | POA: Diagnosis not present

## 2020-12-26 DIAGNOSIS — E041 Nontoxic single thyroid nodule: Secondary | ICD-10-CM | POA: Diagnosis not present

## 2020-12-26 DIAGNOSIS — I2510 Atherosclerotic heart disease of native coronary artery without angina pectoris: Secondary | ICD-10-CM | POA: Diagnosis not present

## 2020-12-26 DIAGNOSIS — I728 Aneurysm of other specified arteries: Secondary | ICD-10-CM | POA: Diagnosis not present

## 2020-12-26 DIAGNOSIS — Z7982 Long term (current) use of aspirin: Secondary | ICD-10-CM | POA: Diagnosis not present

## 2020-12-26 NOTE — Chronic Care Management (AMB) (Signed)
    Chronic Care Management Pharmacy Assistant   Name: Rebecca Mcintyre  MRN: [id number] DOB: 1945/12/28   Reason for Encounter: Patient Assistance Documentation  Per Jeni Salles I completed an application for [place] for patient Rebecca Mcintyre. Application has been sent to [employer] to print and mail to patient. Patient is aware to keep an eye out on the application coming in the mail and for her to complete her portions as soon as she can and tune it into [name] office to be completed and faxed to company. Per patient she would Jeni Salles to send a coupon to the pharmacy for a 30 day supply in the meantime due to patient being unable to afford medication. Jeni Salles aware. Patient thanked me for my call.     Medications: Outpatient Encounter Medications as of 12/26/2020  Medication Sig   acetaminophen (TYLENOL) 500 MG tablet Take 1,000 mg by mouth every 6 (six) hours as needed for mild pain.   albuterol (PROVENTIL) (2.5 MG/3ML) 0.083% nebulizer solution Take 3 mLs (2.5 mg total) by nebulization every 6 (six) hours as needed for wheezing or shortness of breath.   allopurinol (ZYLOPRIM) 100 MG tablet TAKE 1 TABLET BY MOUTH TWICE A DAY   aspirin 81 MG tablet Take 81 mg by mouth daily.   dapagliflozin propanediol (FARXIGA) 10 MG TABS tablet Take 1 tablet (10 mg total) by mouth daily before breakfast.   Fluticasone-Umeclidin-Vilant (TRELEGY ELLIPTA) 100-62.5-25 MCG/INH AEPB Inhale 1 puff into the lungs daily.   furosemide (LASIX) 40 MG tablet Take 1 tablet (40 mg total) by mouth daily.   ketoconazole (NIZORAL) 2 % cream Apply 1 application topically 2 (two) times daily.   metFORMIN (GLUCOPHAGE) 500 MG tablet Take 1 tablet (500 mg total) by mouth 2 (two) times daily with a meal. Increase to 2 twice a day with a meal.   metoprolol tartrate (LOPRESSOR) 25 MG tablet TAKE 1/2 TABLETS BY MOUTH 2 TIMES DAILY   montelukast (SINGULAIR) 10 MG tablet TAKE 1 TABLET (10 MG TOTAL) AT  BEDTIME BY MOUTH.   nitroGLYCERIN (NITROSTAT) 0.4 MG SL tablet PLACE 1 TABLET UNDER THE TONGUE EVERY 5 MINUTES AS NEEDED.   oxyCODONE (OXY IR/ROXICODONE) 5 MG immediate release tablet Take 1 tablet (5 mg total) by mouth every 6 (six) hours as needed for severe pain.   potassium chloride (KLOR-CON) 10 MEQ tablet Take 1 tablet (10 mEq total) by mouth 2 (two) times daily.   PROAIR HFA 108 (90 Base) MCG/ACT inhaler TAKE 2 PUFFS BY MOUTH EVERY 6 HOURS AS NEEDED FOR WHEEZE OR SHORTNESS OF BREATH   rOPINIRole (REQUIP) 0.5 MG tablet 1 TAB AT BEDTIME FOR RESTLESS LEG   simvastatin (ZOCOR) 40 MG tablet Take 1 tablet (40 mg total) by mouth at bedtime.   No facility-administered encounter medications on file as of 12/26/2020.    Star Rating Drugs:  Farxiga 49m - [facility] Metformin 5075m- [facility] Simvastatin 4043m [facility] [phone]

## 2020-12-26 NOTE — Telephone Encounter (Signed)
[facility] and provided one time voucher use for [place]. Pharmacist confirmed that it went through for 30 days supply for $0 copay.

## 2020-12-28 ENCOUNTER — Ambulatory Visit (HOSPITAL_COMMUNITY): Payer: Medicare Other

## 2020-12-29 NOTE — Progress Notes (Signed)
Magnesium  low normal
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
## 2021-01-01 ENCOUNTER — Ambulatory Visit: Payer: Medicare Other | Admitting: Cardiology

## 2021-01-01 ENCOUNTER — Encounter: Payer: Self-pay | Admitting: Cardiology

## 2021-01-01 ENCOUNTER — Other Ambulatory Visit: Payer: Self-pay

## 2021-01-01 VITALS — BP 100/60 | HR 84 | Ht 63.0 in | Wt 243.0 lb

## 2021-01-01 DIAGNOSIS — E118 Type 2 diabetes mellitus with unspecified complications: Secondary | ICD-10-CM | POA: Diagnosis not present

## 2021-01-01 DIAGNOSIS — I2510 Atherosclerotic heart disease of native coronary artery without angina pectoris: Secondary | ICD-10-CM

## 2021-01-01 DIAGNOSIS — I10 Essential (primary) hypertension: Secondary | ICD-10-CM

## 2021-01-01 DIAGNOSIS — E785 Hyperlipidemia, unspecified: Secondary | ICD-10-CM

## 2021-01-01 DIAGNOSIS — I5032 Chronic diastolic (congestive) heart failure: Secondary | ICD-10-CM

## 2021-01-01 NOTE — Patient Instructions (Signed)
Medication Instructions:  No changes *If you need a refill on your cardiac medications before your next appointment, please call your pharmacy*   Lab Work: None ordered If you have labs (blood work) drawn today and your tests are completely normal, you will receive your results only by: [facility] (if you have MyChart) OR A paper copy in the mail If you have any lab test that is abnormal or we need to change your treatment, we will call you to review the results.   Testing/Procedures: None ordered   Follow-Up: At [facility], you and your health needs are our priority.  As part of our continuing mission to provide you with exceptional heart care, we have created designated Provider Care Teams.  These Care Teams include your primary Cardiologist (physician) and Advanced Practice Providers (APPs -  Physician Assistants and Nurse Practitioners) who all work together to provide you with the care you need, when you need it.  We recommend signing up for the patient portal called "MyChart".  Sign up information is provided on this After Visit Summary.  MyChart is used to connect with patients for Virtual Visits (Telemedicine).  Patients are able to view lab/test results, encounter notes, upcoming appointments, etc.  Non-urgent messages can be sent to your provider as well.   To learn more about what you can do with MyChart, go to [email or web address].    Your next appointment:   7 month(s)  The format for your next appointment:   In Person  Provider:   You may see Dr. Percival Spanish or one of the following Advanced Practice Providers on your designated Care Team:   Rosaria Ferries, PA-C Caron Presume, PA-C Jory Sims, DNP, ANP

## 2021-01-02 ENCOUNTER — Ambulatory Visit
Admission: RE | Admit: 2021-01-02 | Discharge: 2021-01-02 | Disposition: A | Discharge status: Home / Self Care | Payer: Medicare Other | Source: Ambulatory Visit | Attending: Internal Medicine | Admitting: Internal Medicine

## 2021-01-02 ENCOUNTER — Ambulatory Visit (HOSPITAL_BASED_OUTPATIENT_CLINIC_OR_DEPARTMENT_OTHER): Payer: Medicare Other | Admitting: Physician Assistant

## 2021-01-02 ENCOUNTER — Ambulatory Visit (HOSPITAL_COMMUNITY): Payer: Medicare Other

## 2021-01-02 DIAGNOSIS — M1990 Unspecified osteoarthritis, unspecified site: Secondary | ICD-10-CM | POA: Diagnosis not present

## 2021-01-02 DIAGNOSIS — Z8673 Personal history of transient ischemic attack (TIA), and cerebral infarction without residual deficits: Secondary | ICD-10-CM | POA: Diagnosis not present

## 2021-01-02 DIAGNOSIS — K219 Gastro-esophageal reflux disease without esophagitis: Secondary | ICD-10-CM | POA: Diagnosis not present

## 2021-01-02 DIAGNOSIS — Z955 Presence of coronary angioplasty implant and graft: Secondary | ICD-10-CM | POA: Diagnosis not present

## 2021-01-02 DIAGNOSIS — I2510 Atherosclerotic heart disease of native coronary artery without angina pectoris: Secondary | ICD-10-CM | POA: Diagnosis not present

## 2021-01-02 DIAGNOSIS — I728 Aneurysm of other specified arteries: Secondary | ICD-10-CM | POA: Diagnosis not present

## 2021-01-02 DIAGNOSIS — N189 Chronic kidney disease, unspecified: Secondary | ICD-10-CM | POA: Diagnosis not present

## 2021-01-02 DIAGNOSIS — Z87891 Personal history of nicotine dependence: Secondary | ICD-10-CM | POA: Diagnosis not present

## 2021-01-02 DIAGNOSIS — K7460 Unspecified cirrhosis of liver: Secondary | ICD-10-CM | POA: Diagnosis not present

## 2021-01-02 DIAGNOSIS — S90921D Unspecified superficial injury of right foot, subsequent encounter: Secondary | ICD-10-CM | POA: Diagnosis not present

## 2021-01-02 DIAGNOSIS — J441 Chronic obstructive pulmonary disease with (acute) exacerbation: Secondary | ICD-10-CM | POA: Diagnosis not present

## 2021-01-02 DIAGNOSIS — G9340 Encephalopathy, unspecified: Secondary | ICD-10-CM | POA: Diagnosis not present

## 2021-01-02 DIAGNOSIS — E041 Nontoxic single thyroid nodule: Secondary | ICD-10-CM

## 2021-01-02 DIAGNOSIS — J9621 Acute and chronic respiratory failure with hypoxia: Secondary | ICD-10-CM | POA: Diagnosis not present

## 2021-01-02 DIAGNOSIS — I252 Old myocardial infarction: Secondary | ICD-10-CM | POA: Diagnosis not present

## 2021-01-02 DIAGNOSIS — Z7984 Long term (current) use of oral hypoglycemic drugs: Secondary | ICD-10-CM | POA: Diagnosis not present

## 2021-01-02 DIAGNOSIS — I130 Hypertensive heart and chronic kidney disease with heart failure and stage 1 through stage 4 chronic kidney disease, or unspecified chronic kidney disease: Secondary | ICD-10-CM | POA: Diagnosis not present

## 2021-01-02 DIAGNOSIS — M109 Gout, unspecified: Secondary | ICD-10-CM | POA: Diagnosis not present

## 2021-01-02 DIAGNOSIS — M8580 Other specified disorders of bone density and structure, unspecified site: Secondary | ICD-10-CM | POA: Diagnosis not present

## 2021-01-02 DIAGNOSIS — E1122 Type 2 diabetes mellitus with diabetic chronic kidney disease: Secondary | ICD-10-CM | POA: Diagnosis not present

## 2021-01-02 DIAGNOSIS — I5033 Acute on chronic diastolic (congestive) heart failure: Secondary | ICD-10-CM | POA: Diagnosis not present

## 2021-01-02 DIAGNOSIS — Z9981 Dependence on supplemental oxygen: Secondary | ICD-10-CM | POA: Diagnosis not present

## 2021-01-02 DIAGNOSIS — E785 Hyperlipidemia, unspecified: Secondary | ICD-10-CM | POA: Diagnosis not present

## 2021-01-02 DIAGNOSIS — Z7982 Long term (current) use of aspirin: Secondary | ICD-10-CM | POA: Diagnosis not present

## 2021-01-04 ENCOUNTER — Ambulatory Visit (HOSPITAL_COMMUNITY): Payer: Medicare Other

## 2021-01-07 NOTE — Progress Notes (Signed)
Thyroid ultrasound shows a nodule that  by size should have a needle biopsy by  to make sure this I a benign process.  Please arrange for fine needle thyroid biopsy for thyroid nodule . evaluation

## 2021-01-08 NOTE — Telephone Encounter (Signed)
Know someone should notify you about appointment. We placed the orders for radiologic procedure.

## 2021-01-09 ENCOUNTER — Other Ambulatory Visit: Payer: Self-pay

## 2021-01-09 ENCOUNTER — Ambulatory Visit (HOSPITAL_COMMUNITY): Payer: Medicare Other

## 2021-01-09 DIAGNOSIS — E041 Nontoxic single thyroid nodule: Secondary | ICD-10-CM

## 2021-01-11 ENCOUNTER — Ambulatory Visit (HOSPITAL_COMMUNITY): Payer: Medicare Other

## 2021-01-11 DIAGNOSIS — I5033 Acute on chronic diastolic (congestive) heart failure: Secondary | ICD-10-CM | POA: Diagnosis not present

## 2021-01-11 DIAGNOSIS — J449 Chronic obstructive pulmonary disease, unspecified: Secondary | ICD-10-CM | POA: Diagnosis not present

## 2021-01-16 ENCOUNTER — Ambulatory Visit (HOSPITAL_COMMUNITY): Payer: Medicare Other

## 2021-01-16 ENCOUNTER — Ambulatory Visit: Payer: Medicare Other | Admitting: Neurology

## 2021-01-16 ENCOUNTER — Encounter: Payer: Self-pay | Admitting: Neurology

## 2021-01-16 VITALS — BP 126/73 | HR 87 | Ht 63.0 in | Wt 243.5 lb

## 2021-01-16 DIAGNOSIS — I671 Cerebral aneurysm, nonruptured: Secondary | ICD-10-CM | POA: Diagnosis not present

## 2021-01-16 NOTE — Patient Instructions (Addendum)
I had a long discussion with the patient and her daughter regarding her asymptomatic small anterior communicating artery aneurysm and discuss the risk of rupture, need for surveillance and the risk benefit of treatment and answered questions.  At the present time given lack of any significant high risk features patient prefers to have conservative follow-up and I recommend repeating CT angiogram brain in 6 months prior to the next visit.  If aneurysm is stable and she remains asymptomatic then will repeat CT angiogram in a year and then once every 2 to 3 years. Cerebral Aneurysm  A cerebral aneurysm is a bulge that occurs in a blood vessel (artery) inside the brain. An aneurysm is caused when a weakened part of the blood vessel expands. The blood vessel expands due to the constant pressure from the flow of blood through the weakened blood vessel. As the aneurysm expands, thewalls of the aneurysm become weaker. Aneurysms are dangerous because they can leak or burst (rupture). When a cerebral aneurysm ruptures, it causes bleeding in the brain (subarachnoid hemorrhage). The blood flow to the area of the brain supplied by the artery is alsoreduced. This can cause a stroke, seizures, or a coma. A ruptured cerebral aneurysm is a medical emergency. This can cause permanentbrain damage or death. What are the causes? The exact cause of this condition is not known. What increases the risk? The following factors may make you more likely to develop this condition: Being older. The condition is most common in people between the ages of 3 and 11. Being female. Having a family history of aneurysm in two or more direct relatives. Having certain conditions that are passed along from parent to child (inherited). They include: Autosomal dominant polycystic kidney disease. This is a condition in which small, fluid-filled sacs (cysts) develop in the kidney. Neurofibromatosis type 1. In this condition, flat spots develop  under the skin (pigmentation) and tumors grow along nerves in the skin, brain, and other parts of the body. Ehlers-Danlos syndrome. This is a condition in which bad connective tissue causes loose or unstable joints and creates a very soft skin that bruises or tears easily. Smoking. Having high blood pressure (hypertension). Abusing alcohol. What are the signs or symptoms? The symptoms of a cerebral aneurysm that has not leaked or ruptured can depend on its size and rate of growth. A small, unchanging aneurysm generally does not cause symptoms. A larger aneurysm that is steadily growing can increase pressure on the brain or nerves. This increased pressure can cause: A headache. Vision problems. Numbness or weakness in an arm or leg. Memory problems. Problems speaking. Seizures. If an aneurysm leaks or ruptures, it can cause a life-threatening condition, such as a stroke. Symptoms may include: A sudden, severe headache with no known cause. The headache is often described as the worst headache ever experienced. Stiff neck. Nausea or vomiting, especially when combined with other symptoms, such as a headache. Sudden weakness or numbness of the face, arm, or leg, especially on one side of the body. Sudden trouble walking or difficulty moving the arms or legs. Double vision or sudden trouble seeing in one or both eyes. Trouble speaking or understanding speech. Trouble swallowing. Dizziness. Loss of balance or coordination. Intolerance to light. Sudden confusion or loss of consciousness. How is this diagnosed? This condition is diagnosed using certain tests, including: CT scan. Computed tomographic angiogram (CTA). This test uses a dye and a scanner to produce images of your blood vessels. Magnetic resonance angiogram (MRA). This test  uses an MRI machine to produce images of your blood vessels. Digital subtraction angiogram (DSA). This test involves placing a long, thin tube (catheter) into  the artery in your thigh and guiding it up to the arteries in the brain. A dye is then injected into the area, and X-rays are taken to create images of your blood vessels. How is this treated? Unruptured aneurysm Treatment for an aneurysm that is not causing problems will depend on many factors, such as the size and location of the aneurysm, your age, your overall health, and your preferences. Small aneurysms in certain locations of the brain have a very low chance of bleeding or rupturing. These small aneurysms may not need to be treated. Your health care provider may monitor the aneurysmregularly to check for any changes. In some cases, however, treatment may be required because of the size or location of an aneurysm. Treatment options may include: Coiling. During this procedure, a catheter is inserted and advanced through a blood vessel. Once the catheter reaches the aneurysm, tiny coils are used to block blood flow into the aneurysm. This procedure is sometimes done at the same time as a DSA. Surgical clipping. During surgery, a clip is placed at the base of the aneurysm. The clip prevents blood from continuing to enter the aneurysm. Flow diversion. This procedure is used to divert blood flow around the aneurysm with a stent that is placed across the opening of an aneurysm. Ruptured aneurysm For a ruptured aneurysm, emergency surgery or coiling is often needed rightaway to help prevent damage to the brain and to reduce the risk of rebleeding. Follow these instructions at home: If your aneurysm is not treated: Take over-the-counter and prescription medicines only as told by your health care provider. Follow a diet suggested by your health care provider. Certain dietary changes may be advised to address hypertension, such as choosing foods that are low in salt (sodium), saturated fat, trans fat, and cholesterol. Stay physically active. Try to get at least 30 minutes of activity on most or all days of  the week. Do not use any products that contain nicotine or tobacco. These products include cigarettes, chewing tobacco, and vaping devices, such as e-cigarettes. If you need help quitting, ask your health care provider. If you drink alcohol: Limit how much you have to: 0-1 drink a day for women who are not pregnant. 0-2 drinks a day for men. Know how much alcohol is in your drink. In the [place] one drink equals one 12 oz bottle of beer (355 mL), one 5 oz glass of wine (148 mL), or one 1 oz glass of hard liquor (44 mL). Do not use drugs. If you need help quitting, ask your health care provider. Keep all follow-up visits. This is important. This includes any referrals, imaging studies, and lab tests. Proper follow-up may prevent an aneurysm rupture or a stroke. Get help right away if: You have a sudden, severe headache with no known cause. This may include a stiff neck. You have sudden nausea or vomiting with a severe headache. You have a seizure. You have other symptoms of a stroke. "BE FAST" is an easy way to remember the main warning signs of a stroke: B - Balance. Signs are dizziness, sudden trouble walking, or loss of balance. E - Eyes. Signs are trouble seeing or a sudden change in vision. F - Face. Signs are sudden weakness or numbness of the face, or the face or eyelid drooping on one side. A -  Arms. Signs are weakness or numbness in an arm. This happens suddenly and usually on one side of the body. S - Speech. Signs are sudden trouble speaking, slurred speech, or trouble understanding what people say. T - Time. Time to call emergency services. Write down what time symptoms started. These symptoms may represent a serious problem that is an emergency. Do not wait to see if the symptoms will go away. Get medical help right away. Call your local emergency services (911 in the [place] Do not drive yourself to the hospital. Summary A cerebral aneurysm is a bulge that occurs in a blood vessel  (artery) inside the brain. Aneurysms are dangerous because they can leak or burst (rupture). When a cerebral aneurysm ruptures, it causes bleeding in the brain. Treatment depends on many factors, including the size and location of the aneurysm and whether it is ruptured. A ruptured aneurysm is a medical emergency. Get help right away if you have symptoms of a stroke. "BE FAST" is an easy way to remember the main warning signs of a stroke. This information is not intended to replace advice given to you by your health care provider. Make sure you discuss any questions you have with your healthcare provider. Document Revised: 02/01/2020 Document Reviewed: 02/01/2020 Elsevier Patient Education  2022 [employer].
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
## 2021-01-17 ENCOUNTER — Telehealth: Payer: Self-pay | Admitting: Neurology

## 2021-01-17 ENCOUNTER — Other Ambulatory Visit: Payer: Self-pay | Admitting: Internal Medicine

## 2021-01-17 NOTE — Telephone Encounter (Signed)
[employer] medicare order sent to GI. NPR they will reach out to the patient to schedule.

## 2021-01-18 ENCOUNTER — Ambulatory Visit (HOSPITAL_COMMUNITY): Payer: Medicare Other

## 2021-01-22 ENCOUNTER — Telehealth: Payer: Self-pay | Admitting: Pharmacist

## 2021-01-22 NOTE — Progress Notes (Addendum)
Chronic Care Management Pharmacy Assistant   Name: Rebecca Mcintyre  MRN: [id number] DOB: Mar 17, 1946  Call to patient per PTM to see what issue patient is having with her Wilder Glade, Call to patient she reports she received a notice asking for more information.  Call to  [facility] and the phones are down until the 31 st at 9 am,   Call to pt left msg to advise I will be checking on it for her once the lines are open/ and asked if she was currently using samples of the medication or in need of any and my contact information.  01-25-2021 Per notes in chart, patient has submitted the form from [facility] that she received requesting more information, they are in need of Prescription Drug Coverage Information and her Medicare Beneficiary Identifier. Will print copies of her health and medicare insurance cards and fax to them today.   Call to [employer] to be sure that is sufficient spoke to [place] gave her all of the insurance information for the patient over the phone. She reports that it would be a good idea to also fax over hard copies to them at [phone] she reported that she would forward to her supervisor to see that that would be sufficient information as she could not see her file. They have my number to call if they are in need of more information  Call to Patient and made her aware of the above.   Medications: Outpatient Encounter Medications as of 01/22/2021  Medication Sig   acetaminophen (TYLENOL) 500 MG tablet Take 1,000 mg by mouth every 6 (six) hours as needed for mild pain.   albuterol (PROVENTIL) (2.5 MG/3ML) 0.083% nebulizer solution Take 3 mLs (2.5 mg total) by nebulization every 6 (six) hours as needed for wheezing or shortness of breath.   allopurinol (ZYLOPRIM) 100 MG tablet TAKE 1 TABLET BY MOUTH TWICE A DAY   aspirin 81 MG tablet Take 81 mg by mouth daily.   dapagliflozin propanediol (FARXIGA) 10 MG TABS tablet Take 1 tablet (10 mg total) by mouth daily before breakfast.    Fluticasone-Umeclidin-Vilant (TRELEGY ELLIPTA) 100-62.5-25 MCG/INH AEPB Inhale 1 puff into the lungs daily.   furosemide (LASIX) 40 MG tablet Take 1 tablet (40 mg total) by mouth daily.   ketoconazole (NIZORAL) 2 % cream Apply 1 application topically 2 (two) times daily.   metFORMIN (GLUCOPHAGE) 500 MG tablet Take 1 tablet (500 mg total) by mouth 2 (two) times daily with a meal. Increase to 2 twice a day with a meal.   metoprolol tartrate (LOPRESSOR) 25 MG tablet TAKE 1/2 TABLETS BY MOUTH 2 TIMES DAILY   montelukast (SINGULAIR) 10 MG tablet TAKE 1 TABLET (10 MG TOTAL) AT BEDTIME BY MOUTH.   nitroGLYCERIN (NITROSTAT) 0.4 MG SL tablet PLACE 1 TABLET UNDER THE TONGUE EVERY 5 MINUTES AS NEEDED.   oxyCODONE (OXY IR/ROXICODONE) 5 MG immediate release tablet Take 1 tablet (5 mg total) by mouth every 6 (six) hours as needed for severe pain.   potassium chloride (KLOR-CON) 10 MEQ tablet Take 1 tablet (10 mEq total) by mouth 2 (two) times daily.   PROAIR HFA 108 (90 Base) MCG/ACT inhaler TAKE 2 PUFFS BY MOUTH EVERY 6 HOURS AS NEEDED FOR WHEEZE OR SHORTNESS OF BREATH   rOPINIRole (REQUIP) 0.5 MG tablet 1 TAB AT BEDTIME FOR RESTLESS LEG   simvastatin (ZOCOR) 40 MG tablet Take 1 tablet (40 mg total) by mouth at bedtime.   No facility-administered encounter  medications on file as of 01/22/2021.    Care Gaps: Zoster Vaccine - Overdue TDAP - Overdue COVID Booster #4 Therapist, music) - Overdue Flue Vaccine - Overdue  Star Rating Drugs: Farxiga 66m - [facility] Metformin 5062m- [facility]vastatin 4057m [facility] [phone]

## 2021-01-23 ENCOUNTER — Ambulatory Visit (HOSPITAL_COMMUNITY): Payer: Medicare Other

## 2021-01-25 ENCOUNTER — Ambulatory Visit
Admission: RE | Admit: 2021-01-25 | Discharge: 2021-01-25 | Disposition: A | Discharge status: Home / Self Care | Payer: Medicare Other | Source: Ambulatory Visit | Attending: Internal Medicine | Admitting: Internal Medicine

## 2021-01-25 ENCOUNTER — Other Ambulatory Visit (HOSPITAL_COMMUNITY)
Admission: RE | Admit: 2021-01-25 | Discharge: 2021-01-25 | Disposition: A | Discharge status: Home / Self Care | Payer: Medicare Other | Source: Ambulatory Visit | Attending: Radiology | Admitting: Radiology

## 2021-01-25 ENCOUNTER — Ambulatory Visit (HOSPITAL_COMMUNITY): Payer: Medicare Other

## 2021-01-25 DIAGNOSIS — E041 Nontoxic single thyroid nodule: Secondary | ICD-10-CM

## 2021-01-26 ENCOUNTER — Telehealth: Payer: Self-pay

## 2021-01-26 LAB — CYTOLOGY - NON PAP

## 2021-01-26 NOTE — Telephone Encounter (Signed)
[place] nurse called requesting POC for skilled nursing and physical therapy  Documents will be faxed call back # [phone]

## 2021-01-27 ENCOUNTER — Encounter: Payer: Self-pay | Admitting: Internal Medicine

## 2021-01-27 NOTE — Telephone Encounter (Signed)
Orders approved

## 2021-01-27 NOTE — Progress Notes (Signed)
Thyroid biopsy show benign  process   good new  no action at this time

## 2021-01-30 DIAGNOSIS — Z8673 Personal history of transient ischemic attack (TIA), and cerebral infarction without residual deficits: Secondary | ICD-10-CM

## 2021-01-30 DIAGNOSIS — I5033 Acute on chronic diastolic (congestive) heart failure: Secondary | ICD-10-CM | POA: Diagnosis not present

## 2021-01-30 DIAGNOSIS — Z7984 Long term (current) use of oral hypoglycemic drugs: Secondary | ICD-10-CM

## 2021-01-30 DIAGNOSIS — Z9981 Dependence on supplemental oxygen: Secondary | ICD-10-CM

## 2021-01-30 DIAGNOSIS — Z7982 Long term (current) use of aspirin: Secondary | ICD-10-CM

## 2021-01-30 DIAGNOSIS — N189 Chronic kidney disease, unspecified: Secondary | ICD-10-CM | POA: Diagnosis not present

## 2021-01-30 DIAGNOSIS — I252 Old myocardial infarction: Secondary | ICD-10-CM

## 2021-01-30 DIAGNOSIS — E1122 Type 2 diabetes mellitus with diabetic chronic kidney disease: Secondary | ICD-10-CM | POA: Diagnosis not present

## 2021-01-30 DIAGNOSIS — E041 Nontoxic single thyroid nodule: Secondary | ICD-10-CM | POA: Diagnosis not present

## 2021-01-30 DIAGNOSIS — K219 Gastro-esophageal reflux disease without esophagitis: Secondary | ICD-10-CM

## 2021-01-30 DIAGNOSIS — J441 Chronic obstructive pulmonary disease with (acute) exacerbation: Secondary | ICD-10-CM | POA: Diagnosis not present

## 2021-01-30 DIAGNOSIS — Z9181 History of falling: Secondary | ICD-10-CM

## 2021-01-30 DIAGNOSIS — I130 Hypertensive heart and chronic kidney disease with heart failure and stage 1 through stage 4 chronic kidney disease, or unspecified chronic kidney disease: Secondary | ICD-10-CM | POA: Diagnosis not present

## 2021-01-30 DIAGNOSIS — S90921D Unspecified superficial injury of right foot, subsequent encounter: Secondary | ICD-10-CM | POA: Diagnosis not present

## 2021-01-30 DIAGNOSIS — E785 Hyperlipidemia, unspecified: Secondary | ICD-10-CM

## 2021-01-30 DIAGNOSIS — K7460 Unspecified cirrhosis of liver: Secondary | ICD-10-CM

## 2021-01-30 DIAGNOSIS — F419 Anxiety disorder, unspecified: Secondary | ICD-10-CM

## 2021-01-30 DIAGNOSIS — G9340 Encephalopathy, unspecified: Secondary | ICD-10-CM | POA: Diagnosis not present

## 2021-01-30 DIAGNOSIS — M1990 Unspecified osteoarthritis, unspecified site: Secondary | ICD-10-CM

## 2021-01-30 DIAGNOSIS — I2510 Atherosclerotic heart disease of native coronary artery without angina pectoris: Secondary | ICD-10-CM | POA: Diagnosis not present

## 2021-01-30 DIAGNOSIS — Z955 Presence of coronary angioplasty implant and graft: Secondary | ICD-10-CM

## 2021-01-30 DIAGNOSIS — I728 Aneurysm of other specified arteries: Secondary | ICD-10-CM | POA: Diagnosis not present

## 2021-01-30 DIAGNOSIS — M8580 Other specified disorders of bone density and structure, unspecified site: Secondary | ICD-10-CM

## 2021-01-30 DIAGNOSIS — Z87891 Personal history of nicotine dependence: Secondary | ICD-10-CM

## 2021-01-30 DIAGNOSIS — Z8701 Personal history of pneumonia (recurrent): Secondary | ICD-10-CM

## 2021-01-30 DIAGNOSIS — J9621 Acute and chronic respiratory failure with hypoxia: Secondary | ICD-10-CM | POA: Diagnosis not present

## 2021-01-30 DIAGNOSIS — M109 Gout, unspecified: Secondary | ICD-10-CM

## 2021-01-30 NOTE — Telephone Encounter (Signed)
Left a message for [facility] nurse to return my call.

## 2021-01-31 ENCOUNTER — Other Ambulatory Visit: Payer: Self-pay

## 2021-01-31 ENCOUNTER — Telehealth: Payer: Self-pay

## 2021-01-31 ENCOUNTER — Telehealth: Payer: Self-pay | Admitting: Pharmacist

## 2021-01-31 MED ORDER — DAPAGLIFLOZIN PROPANEDIOL 10 MG PO TABS: 10.0000 mg | ORAL_TABLET | Freq: Every day | ORAL | 0 refills | Status: DC

## 2021-01-31 NOTE — Telephone Encounter (Signed)
Left a message for the [facility] nurse to return my call.

## 2021-01-31 NOTE — Telephone Encounter (Signed)
Yes can give samples

## 2021-01-31 NOTE — Telephone Encounter (Signed)
-----  Message from Ned Clines sent at 01/31/2021  4:23 PM EDT ----- Regarding: Wilder Glade Samples? Hi Adelai Achey, Im one of [place] assistants working with Ms Husk on her Wilder Glade application for assistance, I know she was out today but got a message from the patient stating she only has 2 pills left do you guys have any samples in the office that can be put aside for her while her application is in process? Any information would be helpful since [name] is out for now and I Have no updates from [place] and me as of yet  [place] Pharmacist Assistant [phone]

## 2021-01-31 NOTE — Telephone Encounter (Signed)
Is this ok to provide pt samples of Farxiga?

## 2021-01-31 NOTE — Chronic Care Management (AMB) (Signed)
    Chronic Care Management Pharmacy Assistant   Name: [name]  MRN: [id number] DOB: 1946-01-14  Reason for Encounter:Patient Assistance follow up : Wilder Glade   Received call from patient she advised she had 2 doses left of her Wilder Glade and wanted to have an update on the status of her patient assistance application.  Message sent to the office to see if there are any in office samples available for this medication that she may use meanwhile. Call to patient to make her aware that I am checking on it and will have an update for her on tomorrow she was in agreement.   Medications: Outpatient Encounter Medications as of 01/31/2021  Medication Sig   acetaminophen (TYLENOL) 500 MG tablet Take 1,000 mg by mouth every 6 (six) hours as needed for mild pain.   albuterol (PROVENTIL) (2.5 MG/3ML) 0.083% nebulizer solution Take 3 mLs (2.5 mg total) by nebulization every 6 (six) hours as needed for wheezing or shortness of breath.   allopurinol (ZYLOPRIM) 100 MG tablet TAKE 1 TABLET BY MOUTH TWICE A DAY   aspirin 81 MG tablet Take 81 mg by mouth daily.   dapagliflozin propanediol (FARXIGA) 10 MG TABS tablet Take 1 tablet (10 mg total) by mouth daily before breakfast.   Fluticasone-Umeclidin-Vilant (TRELEGY ELLIPTA) 100-62.5-25 MCG/INH AEPB Inhale 1 puff into the lungs daily.   furosemide (LASIX) 40 MG tablet Take 1 tablet (40 mg total) by mouth daily.   ketoconazole (NIZORAL) 2 % cream Apply 1 application topically 2 (two) times daily.   metFORMIN (GLUCOPHAGE) 500 MG tablet Take 1 tablet (500 mg total) by mouth 2 (two) times daily with a meal. Increase to 2 twice a day with a meal.   metoprolol tartrate (LOPRESSOR) 25 MG tablet TAKE 1/2 TABLETS BY MOUTH 2 TIMES DAILY   montelukast (SINGULAIR) 10 MG tablet TAKE 1 TABLET (10 MG TOTAL) AT BEDTIME BY MOUTH.   nitroGLYCERIN (NITROSTAT) 0.4 MG SL tablet PLACE 1 TABLET UNDER THE TONGUE EVERY 5 MINUTES AS NEEDED.   oxyCODONE (OXY IR/ROXICODONE) 5 MG  immediate release tablet Take 1 tablet (5 mg total) by mouth every 6 (six) hours as needed for severe pain.   potassium chloride (KLOR-CON) 10 MEQ tablet Take 1 tablet (10 mEq total) by mouth 2 (two) times daily.   PROAIR HFA 108 (90 Base) MCG/ACT inhaler TAKE 2 PUFFS BY MOUTH EVERY 6 HOURS AS NEEDED FOR WHEEZE OR SHORTNESS OF BREATH   rOPINIRole (REQUIP) 0.5 MG tablet 1 TAB AT BEDTIME FOR RESTLESS LEG   simvastatin (ZOCOR) 40 MG tablet Take 1 tablet (40 mg total) by mouth at bedtime.   No facility-administered encounter medications on file as of 01/31/2021.    Care Gaps: Zoster Vaccine - Overdue TDAP - Overdue COVID Booster #4 Therapist, music) - Overdue Flue Vaccine - Overdue  Star Rating Drugs: Farxiga 38m - [facility] - Patient Assistance in Process for this med Metformin 5028m- [facility] Simvastatin 4061m [facility] [phone]

## 2021-01-31 NOTE — Telephone Encounter (Signed)
Samples have been placed in front office for pickup

## 2021-02-01 NOTE — Progress Notes (Signed)
Received a call back from [place] with [employer]  She reports she unable to check the status of application as they are in the process of migrating systems, she noted she has escalated call and to try and check back on Mon or Tue of next week for an update  [place] Pharmacist Assistant [phone]

## 2021-02-01 NOTE — Telephone Encounter (Signed)
Left a message for the [facility] nusre to return my call.

## 2021-02-01 NOTE — Telephone Encounter (Signed)
So they requested  orders. Contact patient and ask  if Rebecca Mcintyre still needs to continue

## 2021-02-01 NOTE — Telephone Encounter (Signed)
Pt stopped by to pick up samples. No further action needed!

## 2021-02-01 NOTE — Telephone Encounter (Signed)
I spoke with [facility] nurse and she stated that the pt has been discharged and if orders are still needed a referral will need to be placed to home health for further assistance.

## 2021-02-01 NOTE — Progress Notes (Signed)
Per Notes in chart Patient has been approved for Farxiga samples and they have been put aside for her at the office. Call to advise patient she states she was called and notified on yesterday and will go and pick them up  [place] Pharmacist Assistant [phone]

## 2021-02-02 DIAGNOSIS — Z9622 Myringotomy tube(s) status: Secondary | ICD-10-CM | POA: Diagnosis not present

## 2021-02-02 DIAGNOSIS — H9221 Otorrhagia, right ear: Secondary | ICD-10-CM | POA: Diagnosis not present

## 2021-02-02 NOTE — Telephone Encounter (Signed)
I spoke with the pt and she reported that she has not received [place] for about 3 weeks. Pt stated that she does not need to continue with [facility] services.

## 2021-02-03 NOTE — Telephone Encounter (Signed)
Ok fine

## 2021-02-04 ENCOUNTER — Other Ambulatory Visit: Payer: Self-pay | Admitting: Pulmonary Disease

## 2021-02-06 ENCOUNTER — Telehealth: Payer: Self-pay | Admitting: Pharmacist

## 2021-02-06 NOTE — Chronic Care Management (AMB) (Signed)
    Chronic Care Management Pharmacy Assistant   Name: GRADIE BUTRICK  MRN: [id number] DOB: [birth year]  02/07/21 APPOINTMENT REMINDER   Called [name], No answer, left message of appointment on 02/07/21 at 11am via telephone visit with Jeni Salles, Pharm D. Notified to have all medications, supplements, blood pressure and/or blood sugar logs available during appointment and to return call if need to reschedule.  Care Gaps:  AWV - patient recently canceled and to be rescheduled. Zoster vaccines - never done Tetanus/Tdap - overdue since  09/13/17 Covid-19 vaccine booster 4 - overdue since 07/06/20 Flu vaccine - due Urine microalbumin - due soon  Star Rating Drug:  Dapagliflozin propanediol 15m - [facility] Metformin 5029m- last [facility] Simvastatin 4062m [facility]  Any gaps in medications fill history? No.  [place] Pharmacist Assistant ([phone]

## 2021-02-07 ENCOUNTER — Ambulatory Visit (INDEPENDENT_AMBULATORY_CARE_PROVIDER_SITE_OTHER): Payer: Medicare Other | Admitting: Pharmacist

## 2021-02-07 DIAGNOSIS — E1165 Type 2 diabetes mellitus with hyperglycemia: Secondary | ICD-10-CM

## 2021-02-07 DIAGNOSIS — J449 Chronic obstructive pulmonary disease, unspecified: Secondary | ICD-10-CM

## 2021-02-07 MED ORDER — ACCU-CHEK GUIDE W/DEVICE KIT: PACK | 2 refills | Status: AC

## 2021-02-07 MED ORDER — ACCU-CHEK GUIDE VI STRP: ORAL_STRIP | 3 refills | Status: DC

## 2021-02-07 MED ORDER — ACCU-CHEK SOFTCLIX LANCETS MISC: 3 refills | Status: AC

## 2021-02-07 NOTE — Addendum Note (Signed)
Addended by: Nilda Riggs on: 02/07/2021 01:48 PM   Modules accepted: Orders

## 2021-02-07 NOTE — Progress Notes (Signed)
Chronic Care Management Pharmacy Note  02/23/2021 Name:  Rebecca Mcintyre MRN:  [id number] DOB:  1945/11/06  Summary: LDL not at goal < 70 A1c not at goal < 7%  Recommendations/Changes made from today's visit: -Recommend repeat lipid panel and if LDL > 70, switch to rosuvastatin 20 mg -Recommend repeat uric acid level and DEXA -Recommend annual low dose CT scan  Plan: Follow up on PAP status Follow up in 3 months   Subjective: Rebecca Mcintyre is an 75 y.o. year old female who is a primary patient of Panosh, Standley Brooking, MD.  The CCM team was consulted for assistance with disease management and care coordination needs.    Engaged with patient by telephone for follow up visit in response to provider referral for pharmacy case management and/or care coordination services.   Consent to Services:  The patient was given information about Chronic Care Management services, agreed to services, and gave verbal consent prior to initiation of services.  Please see initial visit note for detailed documentation.   Patient Care Team: Panosh, Standley Brooking, MD as PCP - General Minus Breeding, MD as PCP - Cardiology (Cardiology) Paula Compton, MD as Attending Physician (Obstetrics and Gynecology) Irene Shipper, MD as Attending Physician (Gastroenterology) Sharyne Peach, MD as Consulting Physician (Ophthalmology) Minus Breeding, MD as Consulting Physician (Cardiology) Rigoberto Noel, MD as Consulting Physician (Pulmonary Disease) Izora Gala, MD as Consulting Physician (Otolaryngology) Viona Gilmore, [facility] as Pharmacist (Pharmacist)  Recent office visits: 12/25/20 Shanon Ace, MD: Patient presented for DM follow up. Plan to repeat potassium. Prescribed ketoconazole.   12/05/20 Shanon Ace, MD: Patient presented for hospital follow up.   10/03/20 Shanon Ace, MD: Patient presented for DM follow up. Prescribed Farxiga 10 mg daily and vitamin B12 1000 mcg daily.   Recent consult visits: 02/02/21  Ebbie Latus, DO (ENT): Patient presented for blood in ear canal. Prescribed ofloxacin 0.3% otic solution.   01/16/21 Antony Contras, MD (neurology): Patient presented for initial visit for cerebral aneurysm. Recommended repeat imaging in 6 months.  01/01/21 Minus Breeding, MD (cardiology): Patient presented for SOB and CAD follow up. No medication changes.  09/26/20 Beckie Salts, MD (Otolaryngology): patient presented for follow-up  08/07/20 Minus Breeding, MD (Cardiology): patient presented for CAD follow-up.  Hospital visits: Medication Reconciliation was completed by comparing discharge summary, patient's EMR and Pharmacy list, and upon discussion with patient.   Admitted to the hospital on 07.05.2022 due to Acute encephalopathy. Discharge date was 07.08.2022. Discharged from [facility]?Medications Started at [facility] Discharge:?? -started the following medication  Oxycodone 5 MG Take 1 tablet (5 mg total) by mouth every 6 (six)hours as needed for severe pain.   Medication Changes at Hospital Discharge: -Changed none   Medications Discontinued at Hospital Discharge: -Stopped none   Medications that remain the same after Hospital Discharge:??  -All other medications will remain the same.   11/24/20 Patient presented to [facility] ER for foot and shoulder injury.   Objective:  Lab Results  Component Value Date   CREATININE 0.96 12/25/2020   BUN 24 (H) 12/25/2020   GFR 58.40 (L) 12/25/2020   GFRNONAA >60 12/01/2020   GFRAA >60 11/12/2018   NA 141 12/25/2020   K 3.8 12/25/2020   CALCIUM 9.6 12/25/2020   CO2 31 12/25/2020   GLUCOSE 123 (H) 12/25/2020    Lab Results  Component Value Date/Time   HGBA1C 7.4 (H) 10/03/2020 09:42 AM  HGBA1C 10.1 (H) 03/27/2020 08:06 AM   GFR 58.40 (L) 12/25/2020 11:38 AM   GFR 72.80 10/03/2020 09:42 AM   MICROALBUR 2.8 (H) 03/27/2020 08:06 AM   MICROALBUR 0.7 04/02/2019 09:05 AM    Last  diabetic Eye exam:  Lab Results  Component Value Date/Time   HMDIABEYEEXA No Retinopathy 05/04/2019 12:00 AM    Last diabetic Foot exam: No results found for: HMDIABFOOTEX   Lab Results  Component Value Date   CHOL 155 03/27/2020   HDL 36.50 (L) 03/27/2020   LDLCALC 89 03/27/2020   LDLDIRECT 116.0 02/05/2019   TRIG 148.0 03/27/2020   CHOLHDL 4 03/27/2020    Hepatic Function Latest Ref Rng & Units 11/28/2020 10/03/2020 03/27/2020  Total Protein 6.5 - 8.1 g/dL 6.3(L) 6.9 6.5  Albumin 3.5 - 5.0 g/dL 3.1(L) 4.0 3.8  AST 15 - 41 U/L 28 25 46(H)  ALT 0 - 44 U/L 25 22 34  Alk Phosphatase 38 - 126 U/L 67 80 95  Total Bilirubin 0.3 - 1.2 mg/dL 0.7 0.5 0.6  Bilirubin, Direct 0.0 - 0.3 mg/dL - 0.1 0.2    Lab Results  Component Value Date/Time   TSH 3.354 11/29/2020 12:03 AM   TSH 3.37 10/03/2020 09:42 AM   TSH 3.46 03/27/2020 08:06 AM   FREET4 0.81 10/03/2020 09:42 AM    CBC Latest Ref Rng & Units 11/28/2020 11/28/2020 10/03/2020  WBC 4.0 - 10.5 K/uL - 8.5 7.2  Hemoglobin 12.0 - 15.0 g/dL 13.6 12.1 14.6  Hematocrit 36.0 - 46.0 % 40.0 40.6 44.4  Platelets 150 - 400 K/uL - 248 218.0    No results found for: VD25OH  Clinical ASCVD: Yes  The ASCVD Risk score (Arnett DK, et al., 2019) failed to calculate for the following reasons:   The patient has a prior MI or stroke diagnosis    Depression screen [facility] 2/9 03/22/2020 01/28/2019 03/10/2017  Decreased Interest 0 0 0  Down, Depressed, Hopeless 0 0 0  PHQ - 2 Score 0 0 0  Altered sleeping - 0 -  Tired, decreased energy - 0 -  Change in appetite - 0 -  Feeling bad or failure about yourself  - 0 -  Trouble concentrating - 0 -  Moving slowly or fidgety/restless - 0 -  Suicidal thoughts - 0 -  PHQ-9 Score - 0 -  Difficult doing work/chores - Not difficult at all -  Some recent data might be hidden      Social History   Tobacco Use  Smoking Status Former   Packs/day: 1.00   Years: 39.00   Pack years: 39.00   Types: Cigarettes    Quit date: 11/04/2018   Years since quitting: 2.3  Smokeless Tobacco Never   BP Readings from Last 3 Encounters:  01/16/21 126/73  01/01/21 100/60  12/25/20 116/70   Pulse Readings from Last 3 Encounters:  02/20/21 78  01/16/21 87  01/01/21 84   Wt Readings from Last 3 Encounters:  02/20/21 243 lb 8 oz (110.5 kg)  01/16/21 243 lb 8 oz (110.5 kg)  01/01/21 243 lb (110.2 kg)   BMI Readings from Last 3 Encounters:  02/20/21 43.13 kg/m  01/16/21 43.13 kg/m  01/01/21 43.05 kg/m    Assessment/Interventions: Review of patient past medical history, allergies, medications, health status, including review of consultants reports, laboratory and other test data, was performed as part of comprehensive evaluation and provision of chronic care management services.   SDOH:  (Social Determinants of Health) assessments and  interventions performed: Yes SDOH Interventions    Flowsheet Row Most Recent Value  SDOH Interventions   Financial Strain Interventions Other (Comment)  [working on Farxiga patient assistance]      SDOH Screenings   Alcohol Screen: Not on file  Depression (PHQ2-9): Low Risk    PHQ-2 Score: 0  Financial Resource Strain: Medium Risk   Difficulty of Paying Living Expenses: Somewhat hard  Food Insecurity: No Food Insecurity   Worried About Charity fundraiser in the Last Year: Never true   Ran Out of Food in the Last Year: Never true  Housing: Low Risk    Last Housing Risk Score: 0  Physical Activity: Sufficiently Active   Days of Exercise per Week: 5 days   Minutes of Exercise per Session: 30 min  Social Connections: Moderately Integrated   Frequency of Communication with Friends and Family: More than three times a week   Frequency of Social Gatherings with Friends and Family: More than three times a week   Attends Religious Services: More than 4 times per year   Active Member of [employer] or Organizations: Yes   Attends Archivist Meetings: 1 to 4 times  per year   Marital Status: Divorced  Stress: No Stress Concern Present   Feeling of Stress : Not at all  Tobacco Use: Medium Risk   Smoking Tobacco Use: Former   Smokeless Tobacco Use: Never  Transportation Needs: No Data processing manager (Medical): No   Lack of Transportation (Non-Medical): No    CCM Care Plan  Allergies  Allergen Reactions   Atorvastatin     REACTION: myalgias  and liver    Medications Reviewed Today     Reviewed by Nilda Riggs, CMA (Certified Medical Assistant) on 02/20/21 at [facility] List Status: <None>   Medication Order Taking? Sig Documenting Provider Last Dose Status Informant  Accu-Chek Softclix Lancets lancets [id number] Yes Use as instructed twice a day Diagnosis E11.9 Panosh, Standley Brooking, MD Taking Active   acetaminophen (TYLENOL) 500 MG tablet [id number] Yes Take 1,000 mg by mouth every 6 (six) hours as needed for mild pain. [provider] Taking Active Child  albuterol (PROVENTIL) (2.5 MG/3ML) 0.083% nebulizer solution [id number] Yes Take 3 mLs (2.5 mg total) by nebulization every 6 (six) hours as needed for wheezing or shortness of breath. Parrett, Fonnie Mu, NP Taking Active Child  allopurinol (ZYLOPRIM) 100 MG tablet [id number] Yes TAKE 1 TABLET BY MOUTH TWICE A DAY Panosh, Standley Brooking, MD Taking Active   aspirin 81 MG tablet [id number] Yes Take 81 mg by mouth daily. [provider] Taking Active Child  Blood Glucose Monitoring Suppl (ACCU-CHEK GUIDE) w/Device KIT [id number] Yes Use as directed twice a day Diagnosis E11.9 Panosh, Standley Brooking, MD Taking Active   dapagliflozin propanediol (FARXIGA) 10 MG TABS tablet [id number] Yes Take 1 tablet (10 mg total) by mouth daily before breakfast. Panosh, Standley Brooking, MD Taking Active Child  dapagliflozin propanediol (FARXIGA) 10 MG TABS tablet [id number] Yes Take 1 tablet (10 mg total) by mouth daily before breakfast. Panosh, Standley Brooking, MD Taking Active   Fluticasone-Umeclidin-Vilant  (TRELEGY ELLIPTA) 100-62.5-25 MCG/INH AEPB [id number] Yes Inhale 1 puff into the lungs daily. Rigoberto Noel, MD Taking Active Child  furosemide (LASIX) 40 MG tablet [id number] Yes Take 1 tablet (40 mg total) by mouth daily. Panosh, Standley Brooking, MD Taking Active Child  glucose blood (ACCU-CHEK GUIDE) test strip [id number] Yes Use as instructed twice a day  Diagnosis E11.9 Panosh, Standley Brooking, MD Taking Active   ketoconazole (NIZORAL) 2 % cream [id number] Yes Apply 1 application topically 2 (two) times daily. Panosh, Standley Brooking, MD Taking Active   metFORMIN (GLUCOPHAGE) 500 MG tablet [id number] Yes Take 1 tablet (500 mg total) by mouth 2 (two) times daily with a meal. Increase to 2 twice a day with a meal. Panosh, Standley Brooking, MD Taking Active Child  metoprolol tartrate (LOPRESSOR) 25 MG tablet [id number] Yes TAKE 1/2 TABLETS BY MOUTH 2 TIMES DAILY Panosh, Standley Brooking, MD Taking Active Child  montelukast (SINGULAIR) 10 MG tablet [id number] Yes TAKE 1 TABLET (10 MG TOTAL) AT BEDTIME BY MOUTH. Panosh, Standley Brooking, MD Taking Active Child  nitroGLYCERIN (NITROSTAT) 0.4 MG SL tablet [id number] Yes PLACE 1 TABLET UNDER THE TONGUE EVERY 5 MINUTES AS NEEDED. Panosh, Standley Brooking, MD Taking Active Child  potassium chloride (KLOR-CON) 10 MEQ tablet [id number] Yes Take 1 tablet (10 mEq total) by mouth 2 (two) times daily. Burnis Medin, MD Taking Active Child  PROAIR HFA 108 [phone] Base) MCG/ACT inhaler [id number] Yes TAKE 2 PUFFS BY MOUTH EVERY 6 HOURS AS NEEDED FOR WHEEZE OR SHORTNESS OF Willia Craze, MD Taking Active Child  rOPINIRole (REQUIP) 0.5 MG tablet [id number] Yes 1 TAB AT BEDTIME FOR RESTLESS LEG Rigoberto Noel, MD Taking Active   simvastatin (ZOCOR) 40 MG tablet [id number] Yes Take 1 tablet (40 mg total) by mouth at bedtime. Panosh, Standley Brooking, MD Taking Active Child            Patient Active Problem List   Diagnosis Date Noted   Elevated troponin 11/28/2020   Anterior communicating artery aneurysm 11/28/2020   Thyroid nodule  11/28/2020   Contusion of right foot    Educated about COVID-19 virus infection 08/04/2019   COPD (chronic obstructive pulmonary disease) ([place]) 12/29/2018   Restless leg syndrome 12/22/2018   Congestive heart failure ([place]) 12/13/2018   Acute on chronic diastolic CHF (congestive heart failure) ([place]) 11/05/2018   Acute CHF (congestive heart failure) ([place]) 11/04/2018   CHF (congestive heart failure) ([place]) 11/04/2018   Diabetes mellitus type II, non insulin dependent ([place]) 11/04/2018   Great toe pain, left 01/01/2018   Myocardial infarction ([place])    Elevated hemoglobin ([place]) 04/24/2014   Visit for preventive health examination 04/24/2014   Pre-diabetes 08/12/2012   Decreased hearing 04/12/2012   Welcome to Medicare preventive visit 04/08/2012   HYPERURICEMIA 03/15/2009   GOUT 12/13/2008   OBESITY 08/23/2008   OSTEOPENIA 09/01/2007   MYOCARDIAL INFARCTION, HX OF 07/14/2007   SOLITARY KIDNEY 07/14/2007   Hyperlipidemia 04/15/2007   Essential hypertension 04/15/2007   CAD (coronary artery disease) 04/15/2007   OTHER ABNORMAL GLUCOSE 04/15/2007   TOBACCO ABUSE, HX OF 04/15/2007    Immunization History  Administered Date(s) Administered   Fluad Quad(high Dose 65+) 02/11/2019, 02/29/2020   Influenza Split 04/29/2011, 04/08/2012   Influenza Whole 02/16/2008, 03/15/2009, 03/20/2010   Influenza, High Dose Seasonal PF 04/14/2013, 02/14/2015, 02/28/2016, 03/10/2017, 03/25/2018   Influenza,inj,Quad PF,6+ Mos 04/18/2014   Influenza,inj,quad, With Preservative 02/25/2019   Influenza-Unspecified 02/24/2017   PFIZER(Purple Top)SARS-COV-2 Vaccination 06/15/2019, 07/06/2019, 04/13/2020   Pneumococcal Conjugate-13 04/08/2012   Pneumococcal Polysaccharide-23 04/14/2013   Td 09/14/2007   Zoster, Live 09/14/2007    Conditions to be addressed/monitored:  Hypertension, Hyperlipidemia, Diabetes, Heart Failure, Coronary Artery Disease, COPD, Osteopenia, and Gout  Care Plan : [place]   Updates made by Viona Gilmore, [place] since 02/23/2021 12:00 AM  Problem: Problem: Hypertension, Hyperlipidemia, Diabetes, Heart Failure, Coronary Artery Disease, COPD, Osteopenia, and Gout      Long-Range Goal: Patient-Specific Goal   Start Date: 02/07/2021  Expected End Date: 02/07/2022  This Visit's Progress: On track  Priority: High  Note:   Current Barriers:  Unable to independently monitor therapeutic efficacy Unable to achieve control of diabetes  Unable to maintain control of cholesterol  Pharmacist Clinical Goal(s):  Patient will achieve control of diabetes as evidenced by A1c maintain control of cholesterol as evidenced by next lipid panel  through collaboration with PharmD and provider.   Interventions: 1:1 collaboration with Panosh, Standley Brooking, MD regarding development and update of comprehensive plan of care as evidenced by provider attestation and co-signature Inter-disciplinary care team collaboration (see longitudinal plan of care) Comprehensive medication review performed; medication list updated in electronic medical record  Hypertension (BP goal <130/80) -Controlled -Current treatment: Furosemide 40 mg 1 tablet daily Metoprolol 25 mg 0.5 tablet by mouth twice daily -Medications previously tried: n/a  -Current home readings: does not check consistently -Current dietary habits: did not discuss -Current exercise habits: limited with shortness of breath -Denies hypotensive/hypertensive symptoms -Educated on BP goals and benefits of medications for prevention of heart attack, stroke and kidney damage; Importance of home blood pressure monitoring; Proper BP monitoring technique; -Counseled to monitor BP at home weekly, document, and provide log at future appointments -Counseled on diet and exercise extensively Recommended to continue current medication  Hyperlipidemia: (LDL goal < 70) -Uncontrolled -Current treatment: Simvastatin 40 mg 1 tablet daily at  bedtime -Medications previously tried: atorvastatin (myalgias) -Current dietary patterns: did not discuss -Current exercise habits: limited with shortness of breath -Educated on Cholesterol goals;  Importance of limiting foods high in cholesterol; Exercise goal of 150 minutes per week; -Counseled on diet and exercise extensively Recommended repeat lipid panel and if LDL > 70, switch to rosuvastatin 20 mg.   Diabetes (A1c goal <7%) -Uncontrolled -Current medications: Farxiga 10 mg 1 tablet daily Metformin 500 mg 2 tablets by mouth twice daily with meals -Medications previously tried: n/a  -Current home glucose readings fasting glucose: 123, 112   post prandial glucose: does not check -Denies hypoglycemic/hyperglycemic symptoms -Current meal patterns:  breakfast: cottage cheese, banana or yogurt and fruit  lunch: biggest meal; baked chicken, red slaw, collards; manicotti and a salad; lots of beans; lots of salmon dinner: soup or PB crackers snacks: not often; couple of cookies yesterday; slushies drinks: majority water, diet coke; hot tea with lemon; orange juice, 2 times a week with coffee -Current exercise: uses an app that keeps up with her walking; walks two times a day (6 minutes) and some simple exercises -Educated on A1c and blood sugar goals; Exercise goal of 150 minutes per week; Carbohydrate counting and/or plate method -Counseled to check feet daily and get yearly eye exams -Counseled on diet and exercise extensively Recommended to continue current medication Assessed patient finances. Working on [place] patient assistance.  CAD (Goal: prevent heart events) -Controlled -Current treatment  Nitrostat 0.4 mg SL 1 tablet under tongue every 5 mins as needed  Aspirin 81 mg 1 tablet daily Simvastatin 40 mg 1 tablet daily at bedtime -Medications previously tried: none  -Recommended to continue current medication   Heart Failure (Goal: manage symptoms and prevent  exacerbations) -Controlled -Last ejection fraction: 60-65% (Date: 11/2020) -HF type: Systolic -NYHA Class: II (slight limitation of activity) -AHA HF Stage: C (Heart disease and symptoms present) -Current treatment: Furosemide 40 mg 1 tablet daily  as needed Metoprolol 25 mg 0.5 tablet twice daily Farxiga 10 mg 1 tablet daily Klor-con 20 mEq 1 tablet daily as needed with furosemide -Medications previously tried: n/a  -Current home BP/HR readings: refer to above -Current dietary habits: did not discuss -Current exercise habits: refer to above -Educated on Importance of weighing daily; if you gain more than 3 pounds in one day or 5 pounds in one week, call cardiologist. Importance of blood pressure control -Counseled on diet and exercise extensively Recommended to continue current medication  COPD (Goal: control symptoms and prevent exacerbations) -Controlled -Current treatment  Montelukast 10 mg 1 tablet at bedtime Albuterol HFA 2 puffs into the lungs every 6 hrs as needed for wheezing or shortness of breath Trelegy 100-62.5-25 mcg/inh 1 puff daily -Medications previously tried: Anoro  -Gold Grade: Gold 2 (FEV1 50-79%) -Current COPD Classification:  B (high sx, <2 exacerbations/yr) -MMRC/CAT score: 3 -Pulmonary function testing: 2020 -Exacerbations requiring treatment in last 6 months: none -Patient reports consistent use of maintenance inhaler -Frequency of rescue inhaler use: almost every day -Counseled on Benefits of consistent maintenance inhaler use When to use rescue inhaler Differences between maintenance and rescue inhalers -Counseled on diet and exercise extensively Recommended to continue current medication Patient is in a COPD study.  Osteopenia (Goal prevent fractures) -Not ideally controlled -Last DEXA Scan: could not locate   T-Score femoral neck: n/a  T-Score total hip: n/a  T-Score lumbar spine: n/a  T-Score forearm radius: n/a  10-year probability of  major osteoporotic fracture: n/a  10-year probability of hip fracture: n/a -Patient is not a candidate for pharmacologic treatment -Current treatment  No medications -Medications previously tried: none  -Recommend weight-bearing and muscle strengthening exercises for building and maintaining bone density. - Reassess at follow up.  Gout (Goal: uric acid <6 and prevent flare ups) -Not ideally controlled -Current treatment  Allopurinol 100 mg 1 tablet twice daily -Medications previously tried: none -Recommended repeat uric acid level.  Restless legs syndrome (Goal: minimize symptoms) -Controlled -Current treatment  Ropinirole 0.5 mg start 0.5 tablet at bedtime for 2 nights, then increase to 1 tablet at bedtime -Medications previously tried: none  -Recommended to continue current medication   Health Maintenance -Vaccine gaps: shingrix, tetanus, COVID booster, influenza -Current therapy:  APAP 500 mg 2 tablets every 6 hrs as needed for mild pain Hydrocortisone 2.5% cream twice daily Ketoconazole 2% cream twice daily -Educated on Cost vs benefit of each product must be carefully weighed by individual consumer -Patient is satisfied with current therapy and denies issues -Recommended to continue current medication  Patient Goals/Self-Care Activities Patient will:  - take medications as prescribed check glucose daily, document, and provide at future appointments check blood pressure weekly, document, and provide at future appointments target a minimum of 150 minutes of moderate intensity exercise weekly  Follow Up Plan: Telephone follow up appointment with care management team member scheduled for: 4 months        Medication Assistance: Application for Farxiga  medication assistance program. in process.  Anticipated assistance start date 03/25/21.  See plan of care for additional detail.  Compliance/Adherence/Medication fill history: Care Gaps: Shingrix, tetanus, COVID  booster, influenza  Star-Rating Drugs: Farxiga 37m - last filled on 11/30/20 [facility]DS [facility] - Patient Assistance in Process for this med Metformin 5031m- last filled on 11/30/20 [facility]DS at [facility] Simvastatin 4075m last filled on 09/29/20 [facility] at [facility]  Patient's preferred pharmacy is:  [id number] [place]  [address] [place] [address] Phone: [phone] Fax: [phone]  Uses pill box? Yes Pt endorses 100% compliance  We discussed: Current pharmacy is preferred with insurance plan and patient is satisfied with pharmacy services Patient decided to: Continue current medication management strategy  Care Plan and Follow Up Patient Decision:  Patient agrees to Care Plan and Follow-up.  Plan: Telephone follow up appointment with care management team member scheduled for:  4 months  Jeni Salles, PharmD, [place] Pharmacist [place] at [place] [phone]

## 2021-02-11 DIAGNOSIS — I5033 Acute on chronic diastolic (congestive) heart failure: Secondary | ICD-10-CM | POA: Diagnosis not present

## 2021-02-11 DIAGNOSIS — J449 Chronic obstructive pulmonary disease, unspecified: Secondary | ICD-10-CM | POA: Diagnosis not present

## 2021-02-12 ENCOUNTER — Other Ambulatory Visit: Payer: Self-pay

## 2021-02-12 MED ORDER — DAPAGLIFLOZIN PROPANEDIOL 10 MG PO TABS: 10.0000 mg | ORAL_TABLET | Freq: Every day | ORAL | 0 refills | Status: DC

## 2021-02-15 ENCOUNTER — Telehealth: Payer: Self-pay | Admitting: Pharmacist

## 2021-02-15 NOTE — Chronic Care Management (AMB) (Signed)
    Chronic Care Management Pharmacy Assistant   Name: Rebecca Mcintyre  MRN: [id number] DOB: Nov 26, 1945   Reason for Encounter: Patient Assistance documentation for Farxiga.   Unable to get through to someone at [employer] after sitting on hold multiple times.   Medications: Outpatient Encounter Medications as of 02/15/2021  Medication Sig   Accu-Chek Softclix Lancets lancets Use as instructed twice a day Diagnosis E11.9   acetaminophen (TYLENOL) 500 MG tablet Take 1,000 mg by mouth every 6 (six) hours as needed for mild pain.   albuterol (PROVENTIL) (2.5 MG/3ML) 0.083% nebulizer solution Take 3 mLs (2.5 mg total) by nebulization every 6 (six) hours as needed for wheezing or shortness of breath.   allopurinol (ZYLOPRIM) 100 MG tablet TAKE 1 TABLET BY MOUTH TWICE A DAY   aspirin 81 MG tablet Take 81 mg by mouth daily.   Blood Glucose Monitoring Suppl (ACCU-CHEK GUIDE) w/Device KIT Use as directed twice a day Diagnosis E11.9   dapagliflozin propanediol (FARXIGA) 10 MG TABS tablet Take 1 tablet (10 mg total) by mouth daily before breakfast.   dapagliflozin propanediol (FARXIGA) 10 MG TABS tablet Take 1 tablet (10 mg total) by mouth daily before breakfast.   Fluticasone-Umeclidin-Vilant (TRELEGY ELLIPTA) 100-62.5-25 MCG/INH AEPB Inhale 1 puff into the lungs daily.   furosemide (LASIX) 40 MG tablet Take 1 tablet (40 mg total) by mouth daily.   glucose blood (ACCU-CHEK GUIDE) test strip Use as instructed twice a day Diagnosis E11.9   ketoconazole (NIZORAL) 2 % cream Apply 1 application topically 2 (two) times daily.   metFORMIN (GLUCOPHAGE) 500 MG tablet Take 1 tablet (500 mg total) by mouth 2 (two) times daily with a meal. Increase to 2 twice a day with a meal.   metoprolol tartrate (LOPRESSOR) 25 MG tablet TAKE 1/2 TABLETS BY MOUTH 2 TIMES DAILY   montelukast (SINGULAIR) 10 MG tablet TAKE 1 TABLET (10 MG TOTAL) AT BEDTIME BY MOUTH.   nitroGLYCERIN (NITROSTAT) 0.4 MG SL tablet PLACE 1 TABLET  UNDER THE TONGUE EVERY 5 MINUTES AS NEEDED.   potassium chloride (KLOR-CON) 10 MEQ tablet Take 1 tablet (10 mEq total) by mouth 2 (two) times daily.   PROAIR HFA 108 (90 Base) MCG/ACT inhaler TAKE 2 PUFFS BY MOUTH EVERY 6 HOURS AS NEEDED FOR WHEEZE OR SHORTNESS OF BREATH   rOPINIRole (REQUIP) 0.5 MG tablet 1 TAB AT BEDTIME FOR RESTLESS LEG   simvastatin (ZOCOR) 40 MG tablet Take 1 tablet (40 mg total) by mouth at bedtime.   No facility-administered encounter medications on file as of 02/15/2021.    Care Gaps:  AWV - patient recently canceled and to be rescheduled. Zoster vaccines - never done Tetanus/Tdap - overdue since  09/13/17 Covid-19 vaccine booster 4 - overdue since 07/06/20 Flu vaccine - due Urine microalbumin - due soon    Star Rating Drugs:  Dapagliflozin propanediol 67m - last filled on 01/24/21 [facility] Metformin 5011m- last filled [facility]/30/20 [facility] Simvastatin 4060m [facility] ([phone]

## 2021-02-16 ENCOUNTER — Ambulatory Visit: Payer: Medicare Other | Admitting: Cardiology

## 2021-02-20 ENCOUNTER — Telehealth (INDEPENDENT_AMBULATORY_CARE_PROVIDER_SITE_OTHER): Payer: Medicare Other | Admitting: Internal Medicine

## 2021-02-20 ENCOUNTER — Encounter: Payer: Self-pay | Admitting: Internal Medicine

## 2021-02-20 VITALS — HR 78 | Ht 63.0 in | Wt 243.5 lb

## 2021-02-20 DIAGNOSIS — J988 Other specified respiratory disorders: Secondary | ICD-10-CM | POA: Diagnosis not present

## 2021-02-20 DIAGNOSIS — Z9981 Dependence on supplemental oxygen: Secondary | ICD-10-CM

## 2021-02-20 DIAGNOSIS — U071 COVID-19: Secondary | ICD-10-CM

## 2021-02-20 DIAGNOSIS — I509 Heart failure, unspecified: Secondary | ICD-10-CM | POA: Diagnosis not present

## 2021-02-20 DIAGNOSIS — J449 Chronic obstructive pulmonary disease, unspecified: Secondary | ICD-10-CM | POA: Diagnosis not present

## 2021-02-20 MED ORDER — NIRMATRELVIR/RITONAVIR (PAXLOVID) TABLET (RENAL DOSING): 2.0000 | ORAL_TABLET | Freq: Two times a day (BID) | ORAL | 0 refills | Status: AC

## 2021-02-20 NOTE — Telephone Encounter (Signed)
She is a candidate for antiviral medication  Add video visit  for  430 today

## 2021-02-20 NOTE — Telephone Encounter (Signed)
I called and spoke with the pt and notified her of response per Dr [name]  She verbalized understanding  She decided to just go without antiviral and will call for steroids if symptoms worse or seek emergent care if needed

## 2021-02-20 NOTE — Progress Notes (Signed)
Virtual Visit via Video Note  I connected with.[name] on 02/20/21 at  4:30 PM EDT by a video enabled telemedicine application and verified that I am speaking with the correct person using two identifiers. Location patient: home Location provider:work office Persons participating in the virtual visit: patient, provider  [place] national recommendations  regarding COVID 19 pandemic   video visit is advised over in office visit for this patient.  Patient aware  of the limitations of evaluation and management by telemedicine and  availability of in person appointments. and agreed to proceed.   HPI: Rebecca Mcintyre presents for video visit because of a positive COVID test  Onset yesterday like head cold and  worse today chills but no specific fever daughter had her do home test x2 and they were both positive  He is generally been well did this weekend go to the beauty shop and a church picnic outside this weekend.  Enjoyed herself that she has been had to be shot in for so long.  She has a history of 3 COVID vaccines Currently she is not more short of breath but her head is stopped up.  O2 has been turned up to 3-1/2 L to keep oxygen level well above 90.  Is have some fatigue .   Daughter has not been sick.   ROS: See pertinent positives and negatives per HPI.  Past Medical History:  Diagnosis Date   Acute on chronic respiratory failure with hypoxia and hypercapnia (HCC) 11/05/2018   Allergy    ASCUS on Pap smear    had repeat x 2 normal with [name] Plan 2011   Coronary artery disease    Gout    Hyperlipidemia    Hypertension    Myocardial infarction [facility])    2002 95% proximal LAD stenosis with thrombus followed by 90% stenosis, the first diagonal 80% stenosis, circumflex 30% stenosis, circumflex obtuse marginal subbranch 70% stenosis, PDA 40% stenosis. She had a drug-eluting stent placement with a Pixel stent   Osteopenia    Solitary kidney     Past Surgical History:   Procedure Laterality Date   CARDIAC CATHETERIZATION     CORONARY ANGIOPLASTY     CORONARY ANGIOPLASTY WITH STENT PLACEMENT  2002   HEMORRHOID SURGERY  2011   LAD Stent angioplasty  2002   Right oophorectomy with salpingectomy     benign growth Dr. Joneen Caraway    Family History  Problem Relation Age of Onset   Breast cancer Mother 47   Lung cancer Father    Heart attack Brother        Died age 70 MI   Colon cancer Brother    Heart disease Daughter 52       smoker and overweight    Esophageal cancer Neg Hx    Pancreatic cancer Neg Hx    Rectal cancer Neg Hx    Stomach cancer Neg Hx     Social History   Tobacco Use   Smoking status: Former    Packs/day: 1.00    Years: 39.00    Pack years: 39.00    Types: Cigarettes    Quit date: 11/04/2018    Years since quitting: 2.2   Smokeless tobacco: Never  Vaping Use   Vaping Use: Never used  Substance Use Topics   Alcohol use: Yes    Comment: 1-2 per month   Drug use: No      Current Outpatient Medications:    Accu-Chek Softclix Lancets lancets, Use  as instructed twice a day Diagnosis E11.9, Disp: 100 each, Rfl: 3   acetaminophen (TYLENOL) 500 MG tablet, Take 1,000 mg by mouth every 6 (six) hours as needed for mild pain., Disp: , Rfl:    albuterol (PROVENTIL) (2.5 MG/3ML) 0.083% nebulizer solution, Take 3 mLs (2.5 mg total) by nebulization every 6 (six) hours as needed for wheezing or shortness of breath., Disp: 300 mL, Rfl: 11   allopurinol (ZYLOPRIM) 100 MG tablet, TAKE 1 TABLET BY MOUTH TWICE A DAY, Disp: 180 tablet, Rfl: 1   aspirin 81 MG tablet, Take 81 mg by mouth daily., Disp: , Rfl:    Blood Glucose Monitoring Suppl (ACCU-CHEK GUIDE) w/Device KIT, Use as directed twice a day Diagnosis E11.9, Disp: 1 kit, Rfl: 2   dapagliflozin propanediol (FARXIGA) 10 MG TABS tablet, Take 1 tablet (10 mg total) by mouth daily before breakfast., Disp: 90 tablet, Rfl: 3   dapagliflozin propanediol (FARXIGA) 10 MG TABS tablet, Take 1 tablet  (10 mg total) by mouth daily before breakfast., Disp: 28 tablet, Rfl: 0   Fluticasone-Umeclidin-Vilant (TRELEGY ELLIPTA) 100-62.5-25 MCG/INH AEPB, Inhale 1 puff into the lungs daily., Disp: 60 each, Rfl: 3   furosemide (LASIX) 40 MG tablet, Take 1 tablet (40 mg total) by mouth daily., Disp: 90 tablet, Rfl: 1   glucose blood (ACCU-CHEK GUIDE) test strip, Use as instructed twice a day Diagnosis E11.9, Disp: 100 each, Rfl: 3   ketoconazole (NIZORAL) 2 % cream, Apply 1 application topically 2 (two) times daily., Disp: 30 g, Rfl: 0   metFORMIN (GLUCOPHAGE) 500 MG tablet, Take 1 tablet (500 mg total) by mouth 2 (two) times daily with a meal. Increase to 2 twice a day with a meal., Disp: 360 tablet, Rfl: 0   metoprolol tartrate (LOPRESSOR) 25 MG tablet, TAKE 1/2 TABLETS BY MOUTH 2 TIMES DAILY, Disp: 90 tablet, Rfl: 1   montelukast (SINGULAIR) 10 MG tablet, TAKE 1 TABLET (10 MG TOTAL) AT BEDTIME BY MOUTH., Disp: 90 tablet, Rfl: 0   nirmatrelvir/ritonavir EUA, renal dosing, (PAXLOVID) 10 x 150 MG & 10 x 100MG TABS, Take 2 tablets by mouth 2 (two) times daily for 5 days. (Take nirmatrelvir 150 mg one tablet twice daily for 5 days and ritonavir 100 mg one tablet twice daily for 5 days) Patient GFR is 58. Stop the simvastatin while on this medication, Disp: 20 tablet, Rfl: 0   nitroGLYCERIN (NITROSTAT) 0.4 MG SL tablet, PLACE 1 TABLET UNDER THE TONGUE EVERY 5 MINUTES AS NEEDED., Disp: 25 tablet, Rfl: 1   potassium chloride (KLOR-CON) 10 MEQ tablet, Take 1 tablet (10 mEq total) by mouth 2 (two) times daily., Disp: 60 tablet, Rfl: 5   PROAIR HFA 108 (90 Base) MCG/ACT inhaler, TAKE 2 PUFFS BY MOUTH EVERY 6 HOURS AS NEEDED FOR WHEEZE OR SHORTNESS OF BREATH, Disp: 8.5 each, Rfl: 3   rOPINIRole (REQUIP) 0.5 MG tablet, 1 TAB AT BEDTIME FOR RESTLESS LEG, Disp: 90 tablet, Rfl: 0   simvastatin (ZOCOR) 40 MG tablet, Take 1 tablet (40 mg total) by mouth at bedtime., Disp: 90 tablet, Rfl: 3  EXAM: BP Readings from Last 3  Encounters:  01/16/21 126/73  01/01/21 100/60  12/25/20 116/70    VITALS per patient if applicable:  GENERAL: alert, oriented, appears well and in no acute distress  HEENT: atraumatic, conjunttiva clear, no obvious abnormalities on inspection of external nose and ears Looks mildly congested on O2 nasal cannula NECK: normal movements of the head and neck  LUNGS: on inspection no  signs of respiratory distress, breathing rate appears normal, no obvious gross SOB, gasping or wheezing  CV: no obvious cyanosis  MS: moves all visible extremities without noticeable abnormality  PSYCH/NEURO: pleasant and cooperative, no obvious depression or anxiety, speech and thought processing grossly intact Lab Results  Component Value Date   WBC 8.5 11/28/2020   HGB 13.6 11/28/2020   HCT 40.0 11/28/2020   PLT 248 11/28/2020   GLUCOSE 123 (H) 12/25/2020   CHOL 155 03/27/2020   TRIG 148.0 03/27/2020   HDL 36.50 (L) 03/27/2020   LDLDIRECT 116.0 02/05/2019   LDLCALC 89 03/27/2020   ALT 25 11/28/2020   AST 28 11/28/2020   NA 141 12/25/2020   K 3.8 12/25/2020   CL 99 12/25/2020   CREATININE 0.96 12/25/2020   BUN 24 (H) 12/25/2020   CO2 31 12/25/2020   TSH 3.354 11/29/2020   INR 1.0 11/28/2020   HGBA1C 7.4 (H) 10/03/2020   MICROALBUR 2.8 (H) 03/27/2020    ASSESSMENT AND PLAN:  Discussed the following assessment and plan:    ICD-10-CM   1. Respiratory tract infection due to COVID-19 virus  U07.1    J98.8     2. Chronic obstructive pulmonary disease, unspecified COPD type ([place])  J44.9     3. Oxygen dependent  Z99.81     4. Congestive heart failure, unspecified HF chronicity, unspecified heart failure type (HCC)  I50.9      High risk conditions day 2 of symptoms mild discussion of medication begin antivirals ,tonight GFR is 58 so renal dosing.  Contact us in a couple days about how she is doing seek emergent care for alarm symptoms early contact medical team.  If oxygen saturations are  not maintaining hospital care. Will notify pulmonary team also for their input as indicated . Counseled.   Expectant management and discussion of plan and treatment with opportunity to ask questions and all were answered. The patient agreed with the plan and demonstrated an understanding of the instructions.   Advised to call back or seek an in-person evaluation if worsening  or having  further concerns . Return for updatet status in 2 days or as needed .   Shanon Ace, MD

## 2021-02-20 NOTE — Telephone Encounter (Signed)
Sorry to hear. Since she is vaccinated and boosted, this is likely to be of a mild illness.  So no treatment would be okay. Alternatively we can give her antiviral Paxlovid for 5 days, please send to [place] which she can screen her other medications for interactions. Keep [place] posted if her breathing gets any worse or if oxygen saturation is less than 92% and we can give her a prescription for steroids

## 2021-02-20 NOTE — Telephone Encounter (Signed)
Received the following message from patient's daughter:   "This message is being sent by Rebecca Mcintyre on behalf of Rebecca Mcintyre.   Good morning, Rebecca Mcintyre thought her allergies were bothering her, so we decided to take a few Covid test. She is positive. She's a little feverish. Stuffy head. Fatigue. I've increased her oxygen therapy slightly to try to keep her oxygen levels at 90. Any other recommendations or new medications to try? Thanks, Rebecca Mcintyre"  RA, can you please advise? Thanks!

## 2021-02-20 NOTE — Telephone Encounter (Cosign Needed)
2nd attempt

## 2021-02-21 ENCOUNTER — Other Ambulatory Visit: Payer: Self-pay | Admitting: Internal Medicine

## 2021-02-23 DIAGNOSIS — J449 Chronic obstructive pulmonary disease, unspecified: Secondary | ICD-10-CM | POA: Diagnosis not present

## 2021-02-23 DIAGNOSIS — E1165 Type 2 diabetes mellitus with hyperglycemia: Secondary | ICD-10-CM | POA: Diagnosis not present

## 2021-02-23 NOTE — Patient Instructions (Signed)
Hi Rebecca Mcintyre,  It was great to get to meet you over the telephone! Below is a summary of some of the topics we discussed.   Please reach out to me if you have any questions or need anything before our follow up!  Best, Maddie  Rebecca Mcintyre, PharmD, [facility]   Visit Information   Goals Addressed   None    Patient Care Plan: CCM Pharmacy Care Plan     Problem Identified: Problem: Hypertension, Hyperlipidemia, Diabetes, Heart Failure, Coronary Artery Disease, COPD, Osteopenia, and Gout      Long-Range Goal: Patient-Specific Goal   Start Date: 02/07/2021  Expected End Date: 02/07/2022  This Visit's Progress: On track  Priority: High  Note:   Current Barriers:  Unable to independently monitor therapeutic efficacy Unable to achieve control of diabetes  Unable to maintain control of cholesterol  Pharmacist Clinical Goal(s):  Patient will achieve control of diabetes as evidenced by A1c maintain control of cholesterol as evidenced by next lipid panel  through collaboration with PharmD and provider.   Interventions: 1:1 collaboration with Panosh, Standley Brooking, MD regarding development and update of comprehensive plan of care as evidenced by provider attestation and co-signature Inter-disciplinary care team collaboration (see longitudinal plan of care) Comprehensive medication review performed; medication list updated in electronic medical record  Hypertension (BP goal <130/80) -Controlled -Current treatment: Furosemide 40 mg 1 tablet daily Metoprolol 25 mg 0.5 tablet by mouth twice daily -Medications previously tried: n/a  -Current home readings: does not check consistently -Current dietary habits: did not discuss -Current exercise habits: limited with shortness of breath -Denies hypotensive/hypertensive symptoms -Educated on BP goals and benefits of medications for prevention of heart attack, stroke and kidney  damage; Importance of home blood pressure monitoring; Proper BP monitoring technique; -Counseled to monitor BP at home weekly, document, and provide log at future appointments -Counseled on diet and exercise extensively Recommended to continue current medication  Hyperlipidemia: (LDL goal < 70) -Uncontrolled -Current treatment: Simvastatin 40 mg 1 tablet daily at bedtime -Medications previously tried: atorvastatin (myalgias) -Current dietary patterns: did not discuss -Current exercise habits: limited with shortness of breath -Educated on Cholesterol goals;  Importance of limiting foods high in cholesterol; Exercise goal of 150 minutes per week; -Counseled on diet and exercise extensively Recommended repeat lipid panel and if LDL > 70, switch to rosuvastatin 20 mg.   Diabetes (A1c goal <7%) -Uncontrolled -Current medications: Farxiga 10 mg 1 tablet daily Metformin 500 mg 2 tablets by mouth twice daily with meals -Medications previously tried: n/a  -Current home glucose readings fasting glucose: 123, 112   post prandial glucose: does not check -Denies hypoglycemic/hyperglycemic symptoms -Current meal patterns:  breakfast: cottage cheese, banana or yogurt and fruit  lunch: biggest meal; baked chicken, red slaw, collards; manicotti and a salad; lots of beans; lots of salmon dinner: soup or PB crackers snacks: not often; couple of cookies yesterday; slushies drinks: majority water, diet coke; hot tea with lemon; orange juice, 2 times a week with coffee -Current exercise: uses an app that keeps up with her walking; walks two times a day (6 minutes) and some simple exercises -Educated on A1c and blood sugar goals; Exercise goal of 150 minutes per week; Carbohydrate counting and/or plate method -Counseled to check feet daily and get yearly eye exams -Counseled on diet and exercise extensively Recommended to continue current medication Assessed patient finances. Working on [place]  patient assistance.  CAD (Goal: prevent heart events) -Controlled -  Current treatment  Nitrostat 0.4 mg SL 1 tablet under tongue every 5 mins as needed  Aspirin 81 mg 1 tablet daily Simvastatin 40 mg 1 tablet daily at bedtime -Medications previously tried: none  -Recommended to continue current medication   Heart Failure (Goal: manage symptoms and prevent exacerbations) -Controlled -Last ejection fraction: 60-65% (Date: 11/2020) -HF type: Systolic -NYHA Class: II (slight limitation of activity) -AHA HF Stage: C (Heart disease and symptoms present) -Current treatment: Furosemide 40 mg 1 tablet daily as needed Metoprolol 25 mg 0.5 tablet twice daily Farxiga 10 mg 1 tablet daily Klor-con 20 mEq 1 tablet daily as needed with furosemide -Medications previously tried: n/a  -Current home BP/HR readings: refer to above -Current dietary habits: did not discuss -Current exercise habits: refer to above -Educated on Importance of weighing daily; if you gain more than 3 pounds in one day or 5 pounds in one week, call cardiologist. Importance of blood pressure control -Counseled on diet and exercise extensively Recommended to continue current medication  COPD (Goal: control symptoms and prevent exacerbations) -Controlled -Current treatment  Montelukast 10 mg 1 tablet at bedtime Albuterol HFA 2 puffs into the lungs every 6 hrs as needed for wheezing or shortness of breath Trelegy 100-62.5-25 mcg/inh 1 puff daily -Medications previously tried: Anoro  -Gold Grade: Gold 2 (FEV1 50-79%) -Current COPD Classification:  B (high sx, <2 exacerbations/yr) -MMRC/CAT score: 3 -Pulmonary function testing: 2020 -Exacerbations requiring treatment in last 6 months: none -Patient reports consistent use of maintenance inhaler -Frequency of rescue inhaler use: almost every day -Counseled on Benefits of consistent maintenance inhaler use When to use rescue inhaler Differences between maintenance and  rescue inhalers -Counseled on diet and exercise extensively Recommended to continue current medication Patient is in a COPD study.  Osteopenia (Goal prevent fractures) -Not ideally controlled -Last DEXA Scan: could not locate   T-Score femoral neck: n/a  T-Score total hip: n/a  T-Score lumbar spine: n/a  T-Score forearm radius: n/a  10-year probability of major osteoporotic fracture: n/a  10-year probability of hip fracture: n/a -Patient is not a candidate for pharmacologic treatment -Current treatment  No medications -Medications previously tried: none  -Recommend weight-bearing and muscle strengthening exercises for building and maintaining bone density. - Reassess at follow up.  Gout (Goal: uric acid <6 and prevent flare ups) -Not ideally controlled -Current treatment  Allopurinol 100 mg 1 tablet twice daily -Medications previously tried: none -Recommended repeat uric acid level.  Restless legs syndrome (Goal: minimize symptoms) -Controlled -Current treatment  Ropinirole 0.5 mg start 0.5 tablet at bedtime for 2 nights, then increase to 1 tablet at bedtime -Medications previously tried: none  -Recommended to continue current medication   Health Maintenance -Vaccine gaps: shingrix, tetanus, COVID booster, influenza -Current therapy:  APAP 500 mg 2 tablets every 6 hrs as needed for mild pain Hydrocortisone 2.5% cream twice daily Ketoconazole 2% cream twice daily -Educated on Cost vs benefit of each product must be carefully weighed by individual consumer -Patient is satisfied with current therapy and denies issues -Recommended to continue current medication  Patient Goals/Self-Care Activities Patient will:  - take medications as prescribed check glucose daily, document, and provide at future appointments check blood pressure weekly, document, and provide at future appointments target a minimum of 150 minutes of moderate intensity exercise weekly  Follow Up Plan:  Telephone follow up appointment with care management team member scheduled for: 4 months       Patient verbalizes understanding of instructions provided today and  agrees to view in [place].  Face to Face appointment with pharmacist scheduled for: 3 months  Viona Gilmore, [facility]

## 2021-02-26 ENCOUNTER — Telehealth: Payer: Self-pay | Admitting: Internal Medicine

## 2021-02-26 NOTE — Telephone Encounter (Signed)
Patient was calling back to follow up about [place]. Patient states she has one week left and then she will be completely out       Good callback number is [phone]    Please Advise

## 2021-02-27 ENCOUNTER — Telehealth: Payer: Self-pay | Admitting: Pharmacist

## 2021-02-27 NOTE — Chronic Care Management (AMB) (Signed)
    Chronic Care Management Pharmacy Assistant   Per Jeni Salles call to [facility] and [place] to check on stats of PAP application for Rebecca Mcintyre, spoke to [facility] who states the system is still not showing an active application for patient, She advised that the application would have to be resubmitted online or re-faxed to their new fax number at [phone]. Advised Jeni Salles, who noted she will set aside another box of samples for patient to use meantime that may be picked up from the office anytime tomorrow or after this week. Call to patient to advise she may pick hem up after tomorrow, she was in agreement said she will come by the office on tomorrow.  [place] Clinical Pharmacist Assistant [phone]

## 2021-02-27 NOTE — Telephone Encounter (Signed)
Refer to [employer] encounter.

## 2021-02-28 ENCOUNTER — Telehealth: Payer: Self-pay | Admitting: *Deleted

## 2021-02-28 MED ORDER — PREDNISONE 10 MG PO TABS: ORAL_TABLET | ORAL | 0 refills | Status: DC

## 2021-02-28 NOTE — Telephone Encounter (Signed)
Called patient but she did not answer. Called patient's daughter [name]. She verbalized understanding of recommendations. She stated that she was prescribed Paxlovid and took all of the pills except for the last day. The side effects were too much for her.   RX for prednisone has been sent in.   Nothing further needed at time of call.

## 2021-02-28 NOTE — Telephone Encounter (Signed)
Mychart message: This message is being sent by [name] on behalf of Rebecca Mcintyre.   Good morning, thankfully, Rebecca Mcintyre had a mild case of Covid last week. But this week it has moved more into her lungs, deep cough, producing more phlegm this week, started clear now more yellowish. Primary care Dr is oot so we wanted to see if dr A had any suggestions on getting her thru this.  Thanks! Rebecca Mcintyre  Primary Pulmonologist: [name] Last office visit and with whom: [name] What do we see them for (pulmonary problems): 07/17/2020 Last OV assessment/plan:  Assessment & Plan:         Assessment & Plan Note by Rigoberto Noel, MD at 07/17/2020 12:14 PM  Author: Rigoberto Noel, MD Author Type: Physician Filed: 07/17/2020 12:14 PM  Note Status: Written Cosign: Cosign Not Required Encounter Date: 07/17/2020  Problem: Congestive heart failure [facility])  Editor: Rigoberto Noel, MD (Physician)               Continue Lasix 40 mg daily, no evidence of fluid overload.  Or cor pulmonale        Assessment & Plan Note by Rigoberto Noel, MD at 07/17/2020 12:13 PM  Author: Rigoberto Noel, MD Author Type: Physician Filed: 07/17/2020 12:13 PM  Note Status: Written Cosign: Cosign Not Required Encounter Date: 07/17/2020  Problem: COPD (chronic obstructive pulmonary disease) ([place])  Editor: Rigoberto Noel, MD (Physician)               Continue Trelegy. Unfortunately she is not a candidate for Zephyr valve. She does not have significant hypercarbia so I do not think she will benefit from NIV.  Prior sleep study has not shown significant OSA        Assessment & Plan Note by Rigoberto Noel, MD at 07/17/2020 12:12 PM  Author: Rigoberto Noel, MD Author Type: Physician Filed: 07/17/2020 12:13 PM  Note Status: Written Cosign: Cosign Not Required Encounter Date: 07/17/2020  Problem: Chronic respiratory failure with hypoxia and hypercapnia ([place])  Editor: Rigoberto Noel, MD (Physician)               Unfortunately she continues  to desaturate on 5 L pulse so she is not a good candidate currently for POC. I encouraged her to once again enrolled in pulmonary rehab.  She has benefited from this in the past and hopefully with improving her conditioning and weight loss, improve oxygenation enough to stabilize on POC        Patient Instructions by Rigoberto Noel, MD at 07/17/2020 10:30 AM  Author: Rigoberto Noel, MD Author Type: Physician Filed: 07/17/2020 11:20 AM  Note Status: Signed Cosign: Cosign Not Required Encounter Date: 07/17/2020  Editor: Rigoberto Noel, MD (Physician)               Ambulatory satn for [place] rehab referral       Orthostatic Vitals Recorded in This Encounter   07/17/2020  1048     BP Location: Left Arm  Cuff Size: Normal   Instructions    Return in about 3 months (around 10/14/2020).  Ambulatory satn for POC Pulm rehab referral       Was appointment offered to patient (explain)?  No, had covid last week.   Reason for call: Mild case of covid last week, never felt bad.  Nose got stuffy again, took an antihistamine and used some nasal spray.  Cough started over the weekend.  This am the cough is deeper and the mucous is think green to yellow.  Denies any fever.  Getting green/yellow out of her nose as well.  Feels congested in head and chest.  Denies any tightness in her chest.  Using Albuterol nebulizer twice a day since the weekend and does get relief.  She is still using the Trelegy 100.  Not using any OTC for cough.  She is on 3.5 L since Covid.  Sats are running 85-88% while active and 95% while sitting.  Dr. [name], please advise.  Thank you.  (examples of things to ask: : When did symptoms start? Fever? Cough? Productive? Color to sputum? More sputum than usual? Wheezing? Have you needed increased oxygen? Are you taking your respiratory medications? What over the counter measures have you tried?)  Allergies  Allergen Reactions   Atorvastatin     REACTION: myalgias  and  liver    Immunization History  Administered Date(s) Administered   Fluad Quad(high Dose 65+) 02/11/2019, 02/29/2020   Influenza Split 04/29/2011, 04/08/2012   Influenza Whole 02/16/2008, 03/15/2009, 03/20/2010   Influenza, High Dose Seasonal PF 04/14/2013, 02/14/2015, 02/28/2016, 03/10/2017, 03/25/2018   Influenza,inj,Quad PF,6+ Mos 04/18/2014   Influenza,inj,quad, With Preservative 02/25/2019   Influenza-Unspecified 02/24/2017   PFIZER(Purple Top)SARS-COV-2 Vaccination 06/15/2019, 07/06/2019, 04/13/2020   Pneumococcal Conjugate-13 04/08/2012   Pneumococcal Polysaccharide-23 04/14/2013   Td 09/14/2007   Zoster, Live 09/14/2007

## 2021-03-02 ENCOUNTER — Other Ambulatory Visit: Payer: Self-pay | Admitting: *Deleted

## 2021-03-02 MED ORDER — DAPAGLIFLOZIN PROPANEDIOL 10 MG PO TABS: 10.0000 mg | ORAL_TABLET | Freq: Every day | ORAL | 0 refills | Status: DC

## 2021-03-06 NOTE — Telephone Encounter (Signed)
Dr. [name], please see mychart message sent by pt's daughter [name]. There is an attached form that will need to be printed and signed if you are okay with doing so.

## 2021-03-08 ENCOUNTER — Telehealth: Payer: Medicare Other | Admitting: Internal Medicine

## 2021-03-08 DIAGNOSIS — Z1231 Encounter for screening mammogram for malignant neoplasm of breast: Secondary | ICD-10-CM | POA: Diagnosis not present

## 2021-03-08 LAB — HM MAMMOGRAPHY

## 2021-03-11 IMAGING — DX PORTABLE CHEST - 1 VIEW
1 series · 1 of 1 positions shown · non-contrast
Comparison: Two-view chest x-ray 11/04/2018.  CTA chest 11/04/2018

CLINICAL DATA: Hypoxia.  Shortness of breath.

EXAM:
PORTABLE CHEST 1 VIEW

[chest ap]
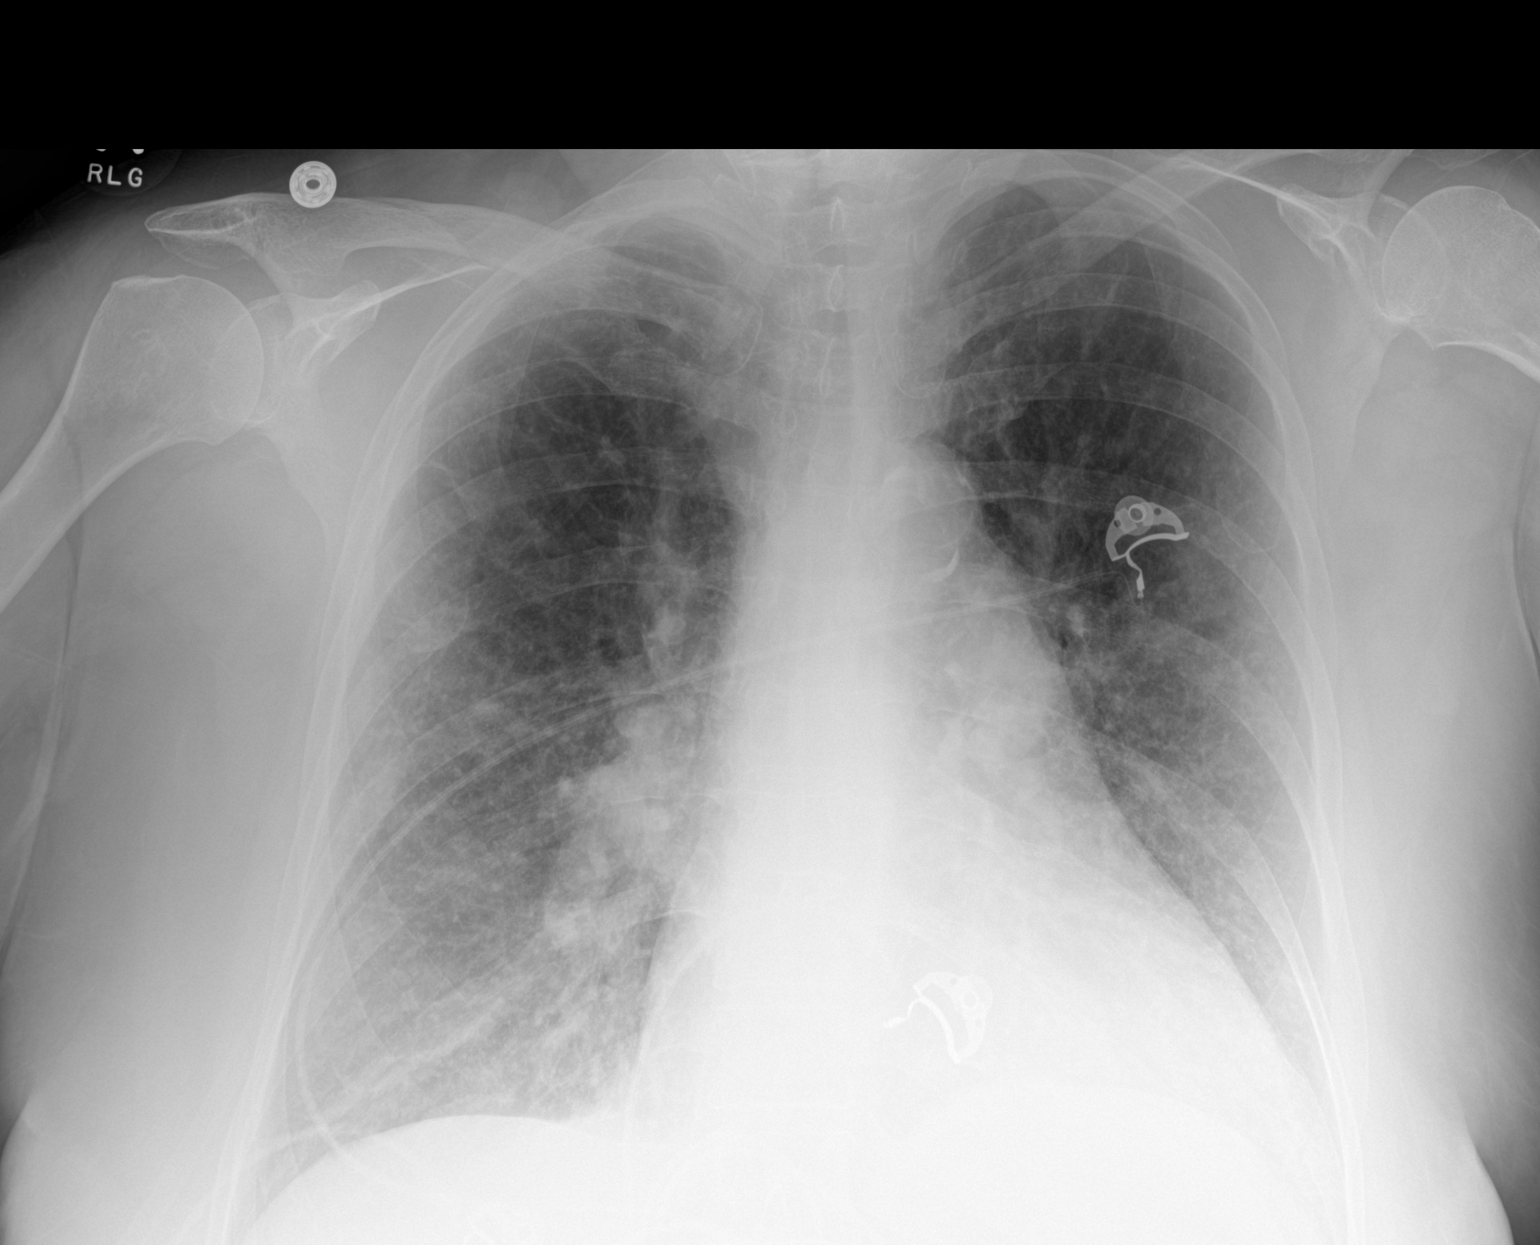

[1 of 1 positions shown; findings below may reference images not displayed]

FINDINGS: The heart is enlarged. Atherosclerotic changes are again noted at
the aortic arch. Pulmonary vascular congestion and mild diffuse
edema has increased. Increasing bibasilar airspace opacities are
present. The visualized soft tissues and bony thorax are
unremarkable.
IMPRESSION: 1. Increasing interstitial and airspace disease likely reflecting
edema and congestive heart failure.
2. Bibasilar airspace disease likely reflects atelectasis. Infection
is not excluded.

## 2021-03-12 IMAGING — DX PORTABLE CHEST - 1 VIEW
1 series · 1 of 1 positions shown · non-contrast
Comparison: 11/05/2018

CLINICAL DATA: Shortness of breath

EXAM:
PORTABLE CHEST 1 VIEW

[chest ap]
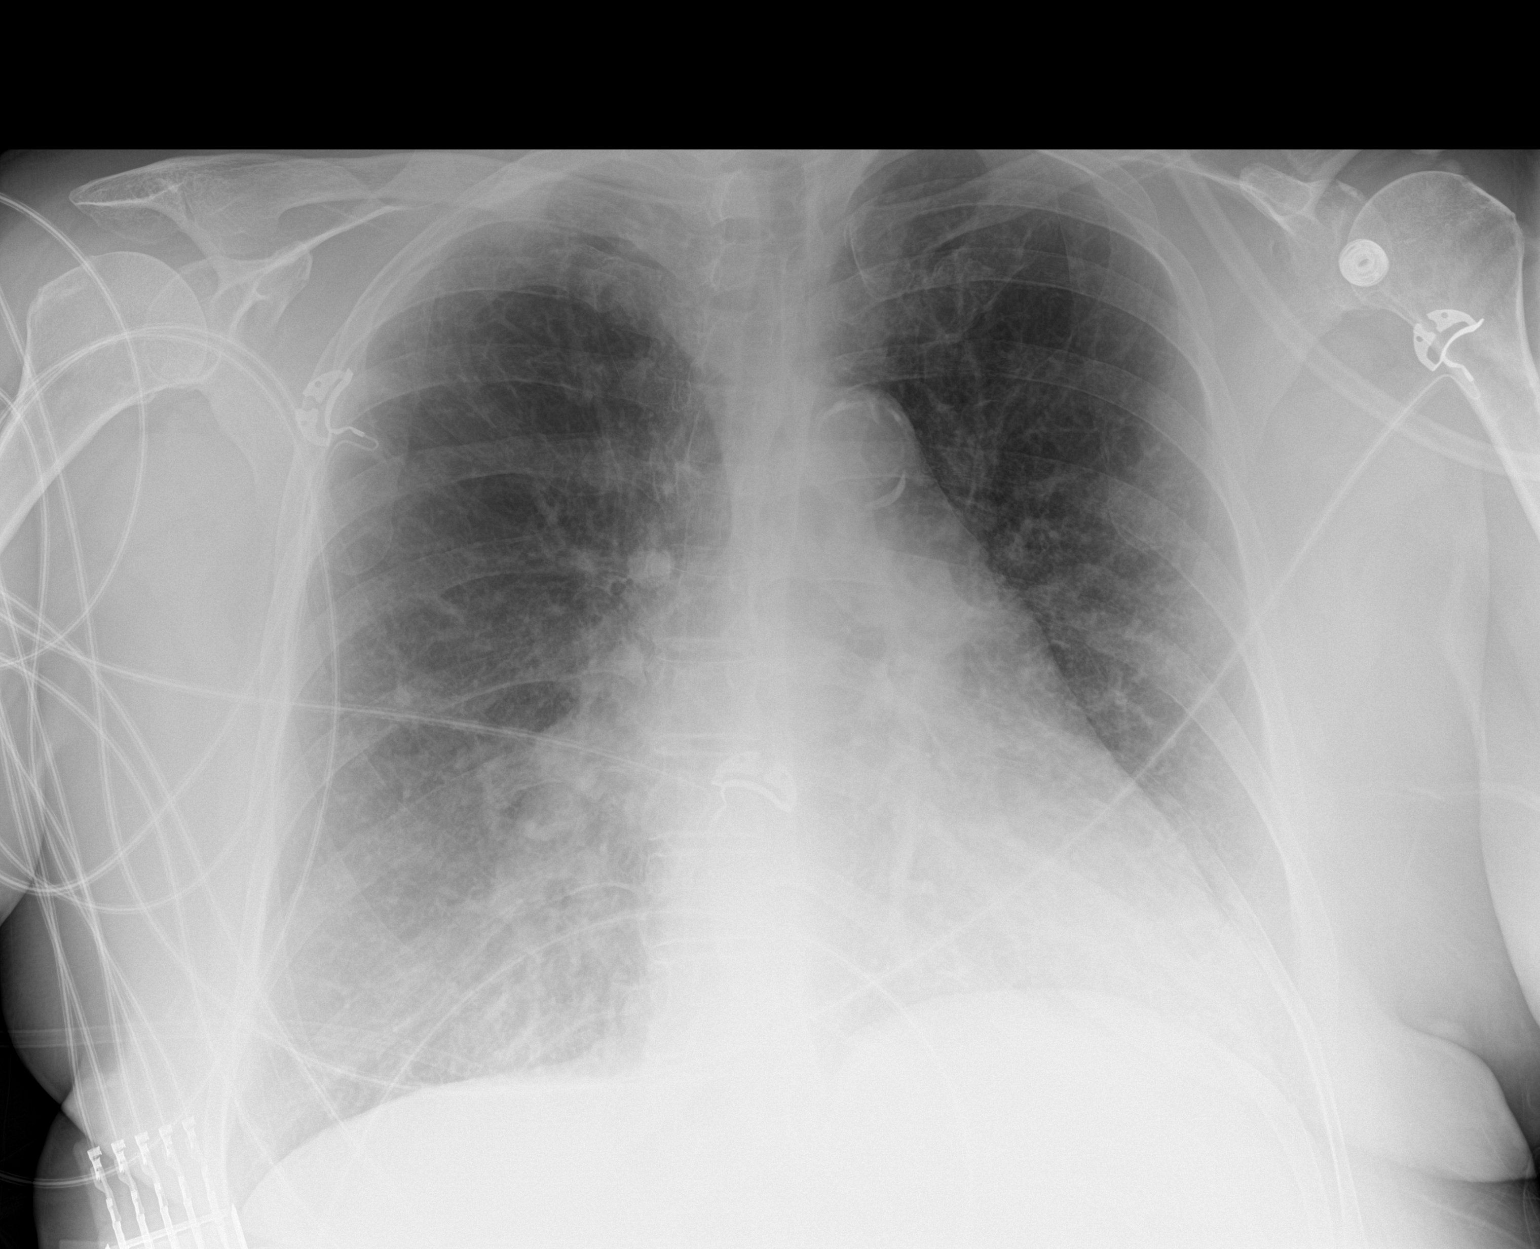

[1 of 1 positions shown; findings below may reference images not displayed]

FINDINGS: Cardiac shadow is stable. Aortic calcifications are again noted.
Mild interstitial changes are noted but slightly improved when
compare with the prior exam consistent with improving CHF. No focal
infiltrate is seen. No effusion is noted.
IMPRESSION: Improving CHF.

## 2021-03-12 NOTE — Telephone Encounter (Signed)
Mychart message sent asking for an update on if form has been able to be completed yet. Form is able to be seen under review media tab of mychart message and it should be able to be printed and filled out.  Dr. [name], please advise. Also routing to [place] as she is working out in [place] with Dr. [name].

## 2021-03-13 ENCOUNTER — Encounter: Payer: Self-pay | Admitting: Internal Medicine

## 2021-03-13 DIAGNOSIS — J449 Chronic obstructive pulmonary disease, unspecified: Secondary | ICD-10-CM | POA: Diagnosis not present

## 2021-03-13 DIAGNOSIS — I5033 Acute on chronic diastolic (congestive) heart failure: Secondary | ICD-10-CM | POA: Diagnosis not present

## 2021-03-14 ENCOUNTER — Telehealth: Payer: Self-pay | Admitting: Pharmacist

## 2021-03-14 IMAGING — DX PORTABLE CHEST - 1 VIEW
1 series · 1 of 1 positions shown · non-contrast
Comparison: 11/06/2018

CLINICAL DATA: Shortness of breath. Congestive heart failure.

EXAM:
PORTABLE CHEST 1 VIEW

[chest ap]
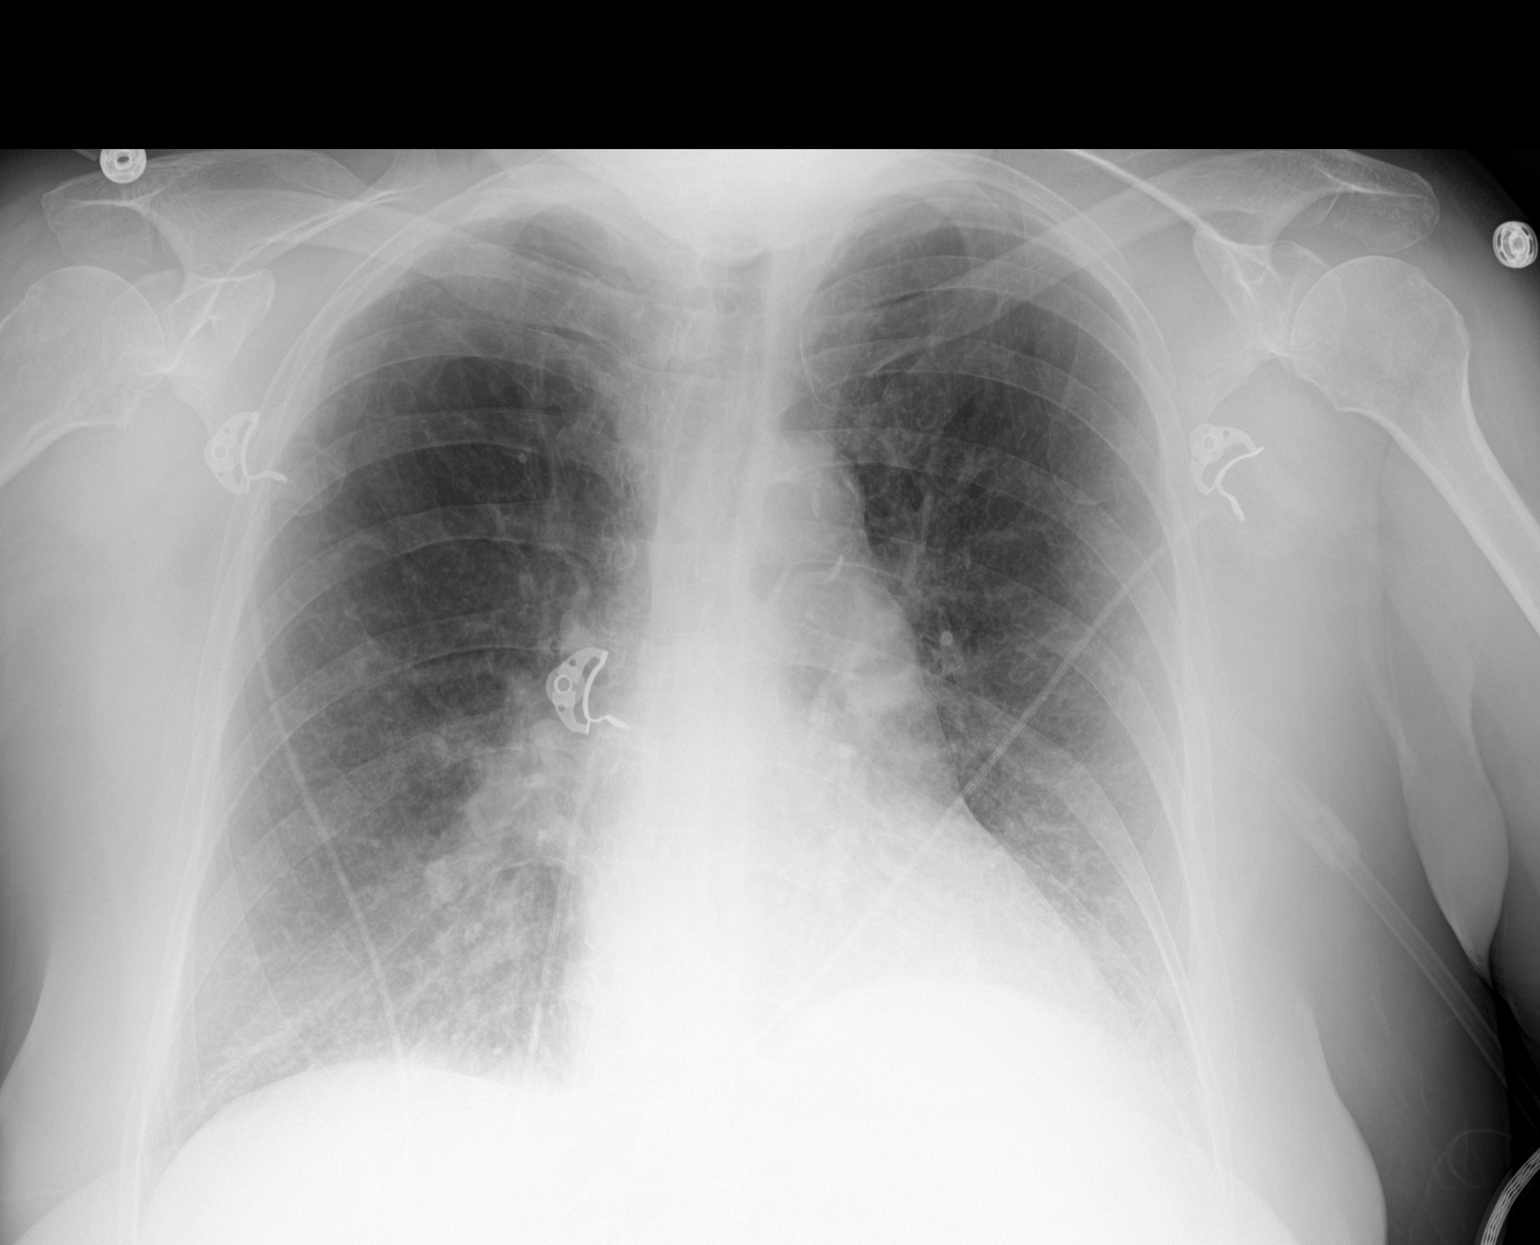

[1 of 1 positions shown; findings below may reference images not displayed]

FINDINGS: Stable mild cardiomegaly. Aortic atherosclerosis. Pulmonary
hyperinflation again seen, consistent with COPD. No evidence of
pulmonary infiltrate or edema. No evidence of pleural effusion.
IMPRESSION: Stable mild cardiomegaly and COPD. No active lung disease.

## 2021-03-14 NOTE — Chronic Care Management (AMB) (Signed)
Chronic Care Management Pharmacy Assistant   Name: Rebecca Mcintyre  MRN: [id number] DOB: [birth year]   Reason for Encounter: Disease State/ Diabetes Assessment Call.   Conditions to be addressed/monitored: DMII   Recent office visits:  02/20/21 [name] (PCP) - seen for respiratory tract infection due to COVID-19 virus. Patient started on Nirmatrelvir- ritonavir 10 x 188m & 10 x 1027m2 tablets 2 times daily. Return in 2 days or as needed.    Recent consult visits:  None.  Hospital visits:  None in previous 6 months  Medications: Outpatient Encounter Medications as of 03/14/2021  Medication Sig   Accu-Chek Softclix Lancets lancets Use as instructed twice a day Diagnosis E11.9   acetaminophen (TYLENOL) 500 MG tablet Take 1,000 mg by mouth every 6 (six) hours as needed for mild pain.   albuterol (PROVENTIL) (2.5 MG/3ML) 0.083% nebulizer solution Take 3 mLs (2.5 mg total) by nebulization every 6 (six) hours as needed for wheezing or shortness of breath.   allopurinol (ZYLOPRIM) 100 MG tablet TAKE 1 TABLET BY MOUTH TWICE A DAY   aspirin 81 MG tablet Take 81 mg by mouth daily.   Blood Glucose Monitoring Suppl (ACCU-CHEK GUIDE) w/Device KIT Use as directed twice a day Diagnosis E11.9   dapagliflozin propanediol (FARXIGA) 10 MG TABS tablet Take 1 tablet (10 mg total) by mouth daily before breakfast.   dapagliflozin propanediol (FARXIGA) 10 MG TABS tablet Take 1 tablet (10 mg total) by mouth daily before breakfast.   Fluticasone-Umeclidin-Vilant (TRELEGY ELLIPTA) 100-62.5-25 MCG/INH AEPB Inhale 1 puff into the lungs daily.   furosemide (LASIX) 40 MG tablet Take 1 tablet (40 mg total) by mouth daily.   glucose blood (ACCU-CHEK GUIDE) test strip Use as instructed twice a day Diagnosis E11.9   ketoconazole (NIZORAL) 2 % cream Apply 1 application topically 2 (two) times daily.   metFORMIN (GLUCOPHAGE) 500 MG tablet TAKE 1 TABLET BY MOUTH TWICE A DAY WITH A MEAL INCREASE TO 2 TABLETS  TWICE A DAY WITH A MEAL   metoprolol tartrate (LOPRESSOR) 25 MG tablet TAKE 1/2 TABLETS BY MOUTH 2 TIMES DAILY   montelukast (SINGULAIR) 10 MG tablet TAKE 1 TABLET (10 MG TOTAL) AT BEDTIME BY MOUTH.   nitroGLYCERIN (NITROSTAT) 0.4 MG SL tablet PLACE 1 TABLET UNDER THE TONGUE EVERY 5 MINUTES AS NEEDED.   potassium chloride (KLOR-CON) 10 MEQ tablet Take 1 tablet (10 mEq total) by mouth 2 (two) times daily.   predniSONE (DELTASONE) 10 MG tablet Take 4 tabs by mouth once daily x4 days, then 3 tabs x4 days, 2 tabs x4 days, 1 tab x4 days and stop.   PROAIR HFA 108 (90 Base) MCG/ACT inhaler TAKE 2 PUFFS BY MOUTH EVERY 6 HOURS AS NEEDED FOR WHEEZE OR SHORTNESS OF BREATH   rOPINIRole (REQUIP) 0.5 MG tablet 1 TAB AT BEDTIME FOR RESTLESS LEG   simvastatin (ZOCOR) 40 MG tablet Take 1 tablet (40 mg total) by mouth at bedtime.   No facility-administered encounter medications on file as of 03/14/2021.   Fill History: PROAIR HFA 90 MCG INHALER 02/18/2021 25   ALLOPURINOL 100 MG TABLET 01/17/2021 90   FARXIGA  10 MG TABS 01/24/2021 30   FLUTICASONE PROP 50 MCG SPRAY 02/18/2021 90   TRELEGY ELLIPTA 100-62.5-25 02/18/2021 30   FUROSEMIDE 40 MG TABLET 12/29/2020 90   ACCU-CHEK GUIDE TEST STRIP 02/07/2021 50   KETOCONAZOLE 2% CREAM 12/25/2020 30   ACCU-CHEK SOFTCLIX LANCETS 02/07/2021 50   METOPROLOL TARTRATE 25 MG TAB 01/19/2021  90   OFLOXACIN 0.3% EAR DROPS 02/02/2021 15   KLOR-CON M10 TABLET 12/31/2020 90   SIMVASTATIN 40 MG TABLET 12/29/2020 90   METFORMIN HCL 500 MG TABLET 02/21/2021 90   OXYCODONE HCL (IR) 5 MG TABLET 12/05/2020 5   PREDNISONE 10 MG TABLET 02/28/2021 16   ROPINIROLE HCL 0.5 MG TABLET 02/04/2021 90   Recent Relevant Labs: Lab Results  Component Value Date/Time   HGBA1C 7.4 (H) 10/03/2020 09:42 AM   HGBA1C 10.1 (H) 03/27/2020 08:06 AM   MICROALBUR 2.8 (H) 03/27/2020 08:06 AM   MICROALBUR 0.7 04/02/2019 09:05 AM    Kidney Function Lab Results  Component Value  Date/Time   CREATININE 0.96 12/25/2020 11:38 AM   CREATININE 0.95 12/01/2020 03:35 AM   GFR 58.40 (L) 12/25/2020 11:38 AM   GFRNONAA >60 12/01/2020 03:35 AM   GFRAA >60 11/12/2018 09:54 AM    Current antihyperglycemic regimen:  Farxiga 21m - take 1 tablet by mouth daily. Metformin 5030m- take 1 tablet by mouth twice daily with a meal then increase to 2 tablets by mouth twice a day. Simvastatin 4072m take 1 tablet by mouth at bedtime. What recent interventions/DTPs have been made to improve glycemic control:  None. Have there been any recent hospitalizations or ED visits since last visit with CPP? No Patient denies hypoglycemic symptoms, including None Patient denies hyperglycemic symptoms, including none How often are you checking your blood sugar? once daily and twice daily What are your blood sugars ranging?  Fasting: 105-112 Bedtime: 140-150 During the week, how often does your blood glucose drop below 70? Never Are you checking your feet daily/regularly? Yes. Everyday.   Adherence Review: Is the patient currently on a STATIN medication? Yes Is the patient currently on ACE/ARB medication? No Does the patient have >5 day gap between last estimated fill dates? No  Notes: Spoke with patient and reviewed all medications as prescribed. Patient reports as taking all medications and is still waiting to hear from patient assistance regarding Farxiga application status. I informed patient I was unable to get through phone line. Patient has cereal for breakfast. Soup for lunch. Dinner is usually something light. She drinks water constantly.  Patient has a coke everyday. Patient is on oxygen 24/7. She does do some walking inside for activity. She is able to perform all ADL's and she takes care of her house. Patients daughter Rebecca Mcintyre her daily at home. Patient thanked me for my call.  Care Gaps:  AWV - scheduled on 03/28/21, to be rescheduled. Zoster vaccine - never  done Tetanus/TDAP - overdue since 2019 Covid-19 vaccine booster 4 - overdue since 2/22 Flu vaccine - due  Last A1C - 7.4 on 10/03/20  Star Rating Drugs:  Metformin 500m53mlast filled on 02/21/21 90DS [facility] Simvastatin 40mg61mast filled on 12/29/20 90DS at [facility] Dapagliflozin 10mg 66mst filled on 01/24/21 30DS at [facility]   [place] Cityacist Assistant [phone]

## 2021-03-17 IMAGING — DX PORTABLE CHEST - 1 VIEW
1 series · 1 of 1 positions shown · non-contrast
Comparison: 11/08/2018, 03/11/2017.  CT 11/04/2018.

CLINICAL DATA: Shortness of breath.

EXAM:
PORTABLE CHEST 1 VIEW

[chest ap]
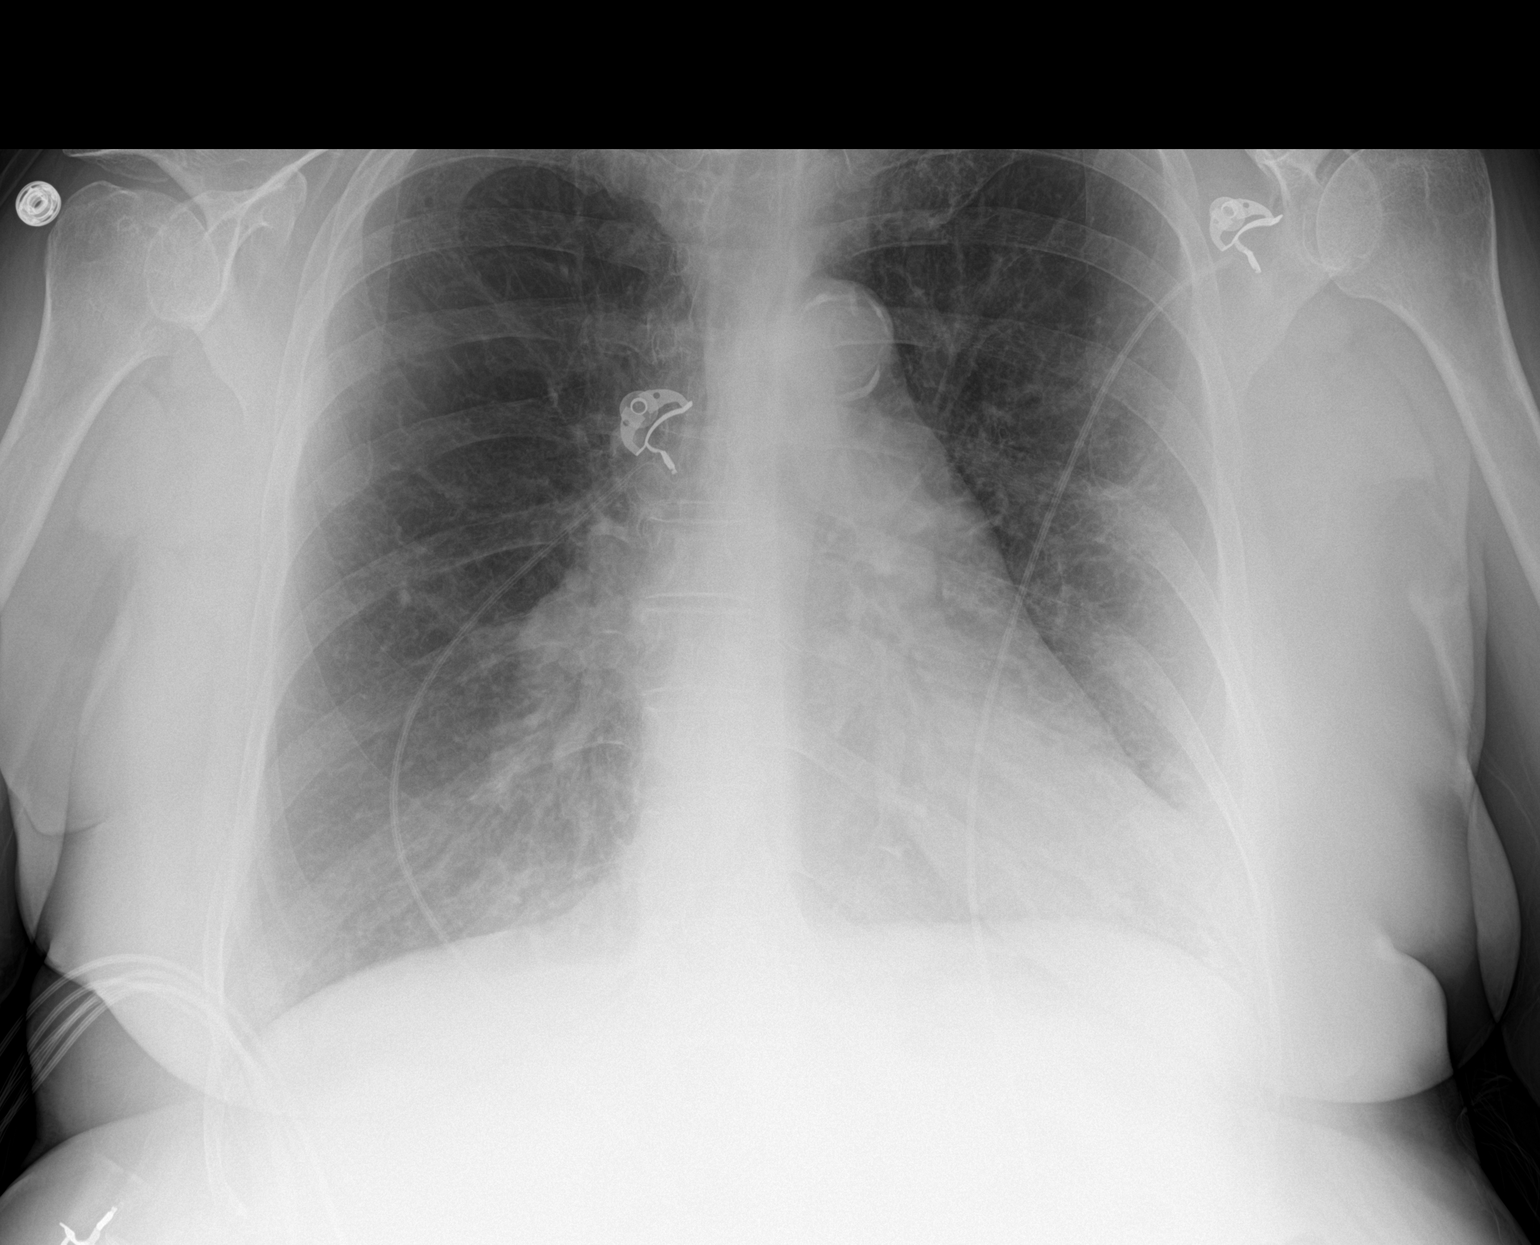

[1 of 1 positions shown; findings below may reference images not displayed]

FINDINGS: Mediastinum and hilar structures are normal. Heart size normal. Mild
bilateral interstitial prominence again noted consistent with
chronic interstitial lung disease. Mild left mid lung field linear
density, most likely subsegmental atelectasis. Follow-up exam
suggested to demonstrate resolution. No pleural effusion or
pneumothorax. No acute bony abnormality.
IMPRESSION: 1. Mild left mid lung field linear density most likely subsegmental
atelectasis. Follow-up exam to demonstrate resolution suggested.

2.  Chronic interstitial lung disease

## 2021-03-18 IMAGING — DX PORTABLE CHEST - 1 VIEW
1 series · 1 of 1 positions shown · non-contrast
Comparison: November 11, 2018

CLINICAL DATA: Shortness of breath

EXAM:
PORTABLE CHEST 1 VIEW

[chest ap]
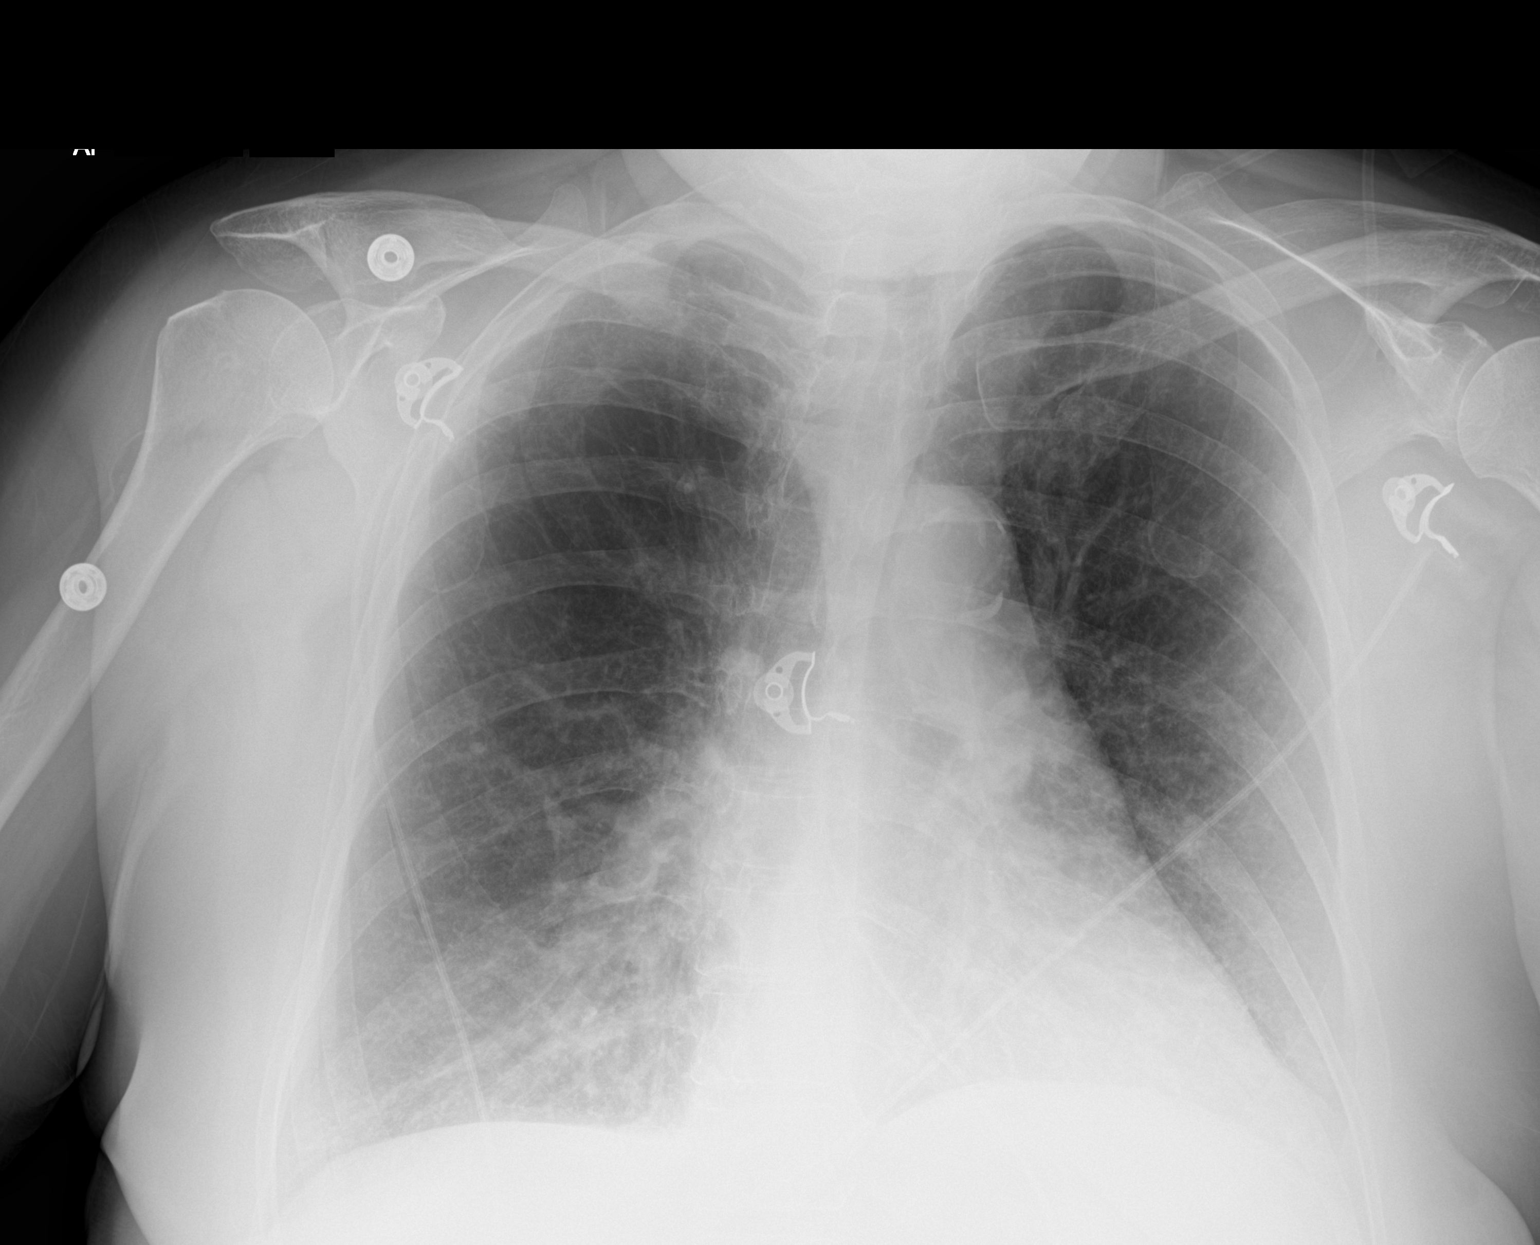

[1 of 1 positions shown; findings below may reference images not displayed]

FINDINGS: Cardiomediastinal silhouette is stable. Mild increasing opacity in
the right base. No other acute abnormalities.
IMPRESSION: Mild increased opacity in the right base may represent atelectasis
versus developing infection/pneumonia. No other interval changes.

## 2021-03-19 DIAGNOSIS — H903 Sensorineural hearing loss, bilateral: Secondary | ICD-10-CM | POA: Diagnosis not present

## 2021-03-19 DIAGNOSIS — H6501 Acute serous otitis media, right ear: Secondary | ICD-10-CM | POA: Diagnosis not present

## 2021-03-20 ENCOUNTER — Other Ambulatory Visit: Payer: Self-pay | Admitting: Pulmonary Disease

## 2021-03-26 ENCOUNTER — Ambulatory Visit: Payer: Medicare Other | Admitting: Neurology

## 2021-03-26 ENCOUNTER — Telehealth: Payer: Self-pay | Admitting: Pharmacist

## 2021-03-26 NOTE — Chronic Care Management (AMB) (Signed)
    Chronic Care Management Pharmacy Assistant   Name: CARRIN VANNOSTRAND  MRN: [id number] DOB: 05/02/1946  Reason for Encounter: Follow up on [place] patient assistance. Spoke with Ronnie at [employer]. Patient was denied, income criteria was not met.    Medications: Outpatient Encounter Medications as of 03/26/2021  Medication Sig   Accu-Chek Softclix Lancets lancets Use as instructed twice a day Diagnosis E11.9   acetaminophen (TYLENOL) 500 MG tablet Take 1,000 mg by mouth every 6 (six) hours as needed for mild pain.   albuterol (PROVENTIL) (2.5 MG/3ML) 0.083% nebulizer solution Take 3 mLs (2.5 mg total) by nebulization every 6 (six) hours as needed for wheezing or shortness of breath.   allopurinol (ZYLOPRIM) 100 MG tablet TAKE 1 TABLET BY MOUTH TWICE A DAY   aspirin 81 MG tablet Take 81 mg by mouth daily.   Blood Glucose Monitoring Suppl (ACCU-CHEK GUIDE) w/Device KIT Use as directed twice a day Diagnosis E11.9   dapagliflozin propanediol (FARXIGA) 10 MG TABS tablet Take 1 tablet (10 mg total) by mouth daily before breakfast.   dapagliflozin propanediol (FARXIGA) 10 MG TABS tablet Take 1 tablet (10 mg total) by mouth daily before breakfast.   furosemide (LASIX) 40 MG tablet Take 1 tablet (40 mg total) by mouth daily.   glucose blood (ACCU-CHEK GUIDE) test strip Use as instructed twice a day Diagnosis E11.9   ketoconazole (NIZORAL) 2 % cream Apply 1 application topically 2 (two) times daily.   metFORMIN (GLUCOPHAGE) 500 MG tablet TAKE 1 TABLET BY MOUTH TWICE A DAY WITH A MEAL INCREASE TO 2 TABLETS TWICE A DAY WITH A MEAL   metoprolol tartrate (LOPRESSOR) 25 MG tablet TAKE 1/2 TABLETS BY MOUTH 2 TIMES DAILY   montelukast (SINGULAIR) 10 MG tablet TAKE 1 TABLET (10 MG TOTAL) AT BEDTIME BY MOUTH.   nitroGLYCERIN (NITROSTAT) 0.4 MG SL tablet PLACE 1 TABLET UNDER THE TONGUE EVERY 5 MINUTES AS NEEDED.   potassium chloride (KLOR-CON) 10 MEQ tablet Take 1 tablet (10 mEq total) by mouth 2 (two) times  daily.   predniSONE (DELTASONE) 10 MG tablet Take 4 tabs by mouth once daily x4 days, then 3 tabs x4 days, 2 tabs x4 days, 1 tab x4 days and stop.   PROAIR HFA 108 (90 Base) MCG/ACT inhaler TAKE 2 PUFFS BY MOUTH EVERY 6 HOURS AS NEEDED FOR WHEEZE OR SHORTNESS OF BREATH   rOPINIRole (REQUIP) 0.5 MG tablet 1 TAB AT BEDTIME FOR RESTLESS LEG   simvastatin (ZOCOR) 40 MG tablet Take 1 tablet (40 mg total) by mouth at bedtime.   TRELEGY ELLIPTA 100-62.5-25 MCG/ACT AEPB TAKE 1 PUFF BY MOUTH EVERY DAY   No facility-administered encounter medications on file as of 03/26/2021.    [place] Pharmacist Assistant [phone]

## 2021-03-27 ENCOUNTER — Ambulatory Visit: Payer: Medicare Other

## 2021-03-28 ENCOUNTER — Ambulatory Visit: Payer: Medicare Other

## 2021-03-28 ENCOUNTER — Other Ambulatory Visit: Payer: Self-pay | Admitting: Internal Medicine

## 2021-03-30 ENCOUNTER — Ambulatory Visit (INDEPENDENT_AMBULATORY_CARE_PROVIDER_SITE_OTHER): Payer: Medicare Other

## 2021-03-30 DIAGNOSIS — Z789 Other specified health status: Secondary | ICD-10-CM | POA: Diagnosis not present

## 2021-03-30 DIAGNOSIS — Z0000 Encounter for general adult medical examination without abnormal findings: Secondary | ICD-10-CM

## 2021-03-30 NOTE — Progress Notes (Signed)
Subjective:   Rebecca Mcintyre is a 75 y.o. female who presents for Medicare Annual (Subsequent) preventive examination.  I connected with Rebecca Mcintyre today by telephone and verified that I am speaking with the correct person using two identifiers. Location patient: home Location provider: work Persons participating in the virtual visit: patient, provider.   I discussed the limitations, risks, security and privacy concerns of performing an evaluation and management service by telephone and the availability of in person appointments. I also discussed with the patient that there may be a patient responsible charge related to this service. The patient expressed understanding and verbally consented to this telephonic visit.    Interactive audio and video telecommunications were attempted between this provider and patient, however failed, due to patient having technical difficulties OR patient did not have access to video capability.  We continued and completed visit with audio only.    Review of Systems           Objective:    Today's Vitals   There is no height or weight on file to calculate BMI.  Advanced Directives 03/30/2021 11/29/2020 03/22/2020 12/13/2018 11/04/2018 12/25/2016 12/11/2016  Does Patient Have a Medical Advance Directive? Yes No Yes Yes No Yes Yes  Type of Paramedic of [place];Living will - [place];Living will [place];Living will - - [place];Living will  Does patient want to make changes to medical advance directive? - - - No - Patient declined - - -  Copy of [place] in Chart? No - copy requested - No - copy requested No - copy requested - - -  Would patient like information on creating a medical advance directive? - No - Patient declined - - No - Patient declined - -    Current Medications (verified) Outpatient Encounter Medications as of 03/30/2021  Medication Sig    Accu-Chek Softclix Lancets lancets Use as instructed twice a day Diagnosis E11.9   acetaminophen (TYLENOL) 500 MG tablet Take 1,000 mg by mouth every 6 (six) hours as needed for mild pain.   albuterol (PROVENTIL) (2.5 MG/3ML) 0.083% nebulizer solution Take 3 mLs (2.5 mg total) by nebulization every 6 (six) hours as needed for wheezing or shortness of breath.   allopurinol (ZYLOPRIM) 100 MG tablet TAKE 1 TABLET BY MOUTH TWICE A DAY   aspirin 81 MG tablet Take 81 mg by mouth daily.   Blood Glucose Monitoring Suppl (ACCU-CHEK GUIDE) w/Device KIT Use as directed twice a day Diagnosis E11.9   dapagliflozin propanediol (FARXIGA) 10 MG TABS tablet Take 1 tablet (10 mg total) by mouth daily before breakfast.   dapagliflozin propanediol (FARXIGA) 10 MG TABS tablet Take 1 tablet (10 mg total) by mouth daily before breakfast.   furosemide (LASIX) 40 MG tablet TAKE 1 TABLET BY MOUTH EVERY DAY   glucose blood (ACCU-CHEK GUIDE) test strip Use as instructed twice a day Diagnosis E11.9   ketoconazole (NIZORAL) 2 % cream Apply 1 application topically 2 (two) times daily.   KLOR-CON M10 10 MEQ tablet TAKE 1 TABLET BY MOUTH 2 TIMES DAILY.   metFORMIN (GLUCOPHAGE) 500 MG tablet TAKE 1 TABLET BY MOUTH TWICE A DAY WITH A MEAL INCREASE TO 2 TABLETS TWICE A DAY WITH A MEAL   metoprolol tartrate (LOPRESSOR) 25 MG tablet TAKE 1/2 TABLETS BY MOUTH 2 TIMES DAILY   montelukast (SINGULAIR) 10 MG tablet TAKE 1 TABLET (10 MG TOTAL) AT BEDTIME BY MOUTH.   nitroGLYCERIN (  NITROSTAT) 0.4 MG SL tablet PLACE 1 TABLET UNDER THE TONGUE EVERY 5 MINUTES AS NEEDED.   PROAIR HFA 108 (90 Base) MCG/ACT inhaler TAKE 2 PUFFS BY MOUTH EVERY 6 HOURS AS NEEDED FOR WHEEZE OR SHORTNESS OF BREATH   rOPINIRole (REQUIP) 0.5 MG tablet 1 TAB AT BEDTIME FOR RESTLESS LEG   simvastatin (ZOCOR) 40 MG tablet Take 1 tablet (40 mg total) by mouth at bedtime.   TRELEGY ELLIPTA 100-62.5-25 MCG/ACT AEPB TAKE 1 PUFF BY MOUTH EVERY DAY   predniSONE (DELTASONE)  10 MG tablet Take 4 tabs by mouth once daily x4 days, then 3 tabs x4 days, 2 tabs x4 days, 1 tab x4 days and stop. (Patient not taking: Reported on 03/30/2021)   No facility-administered encounter medications on file as of 03/30/2021.    Allergies (verified) Atorvastatin   History: Past Medical History:  Diagnosis Date   Acute on chronic respiratory failure with hypoxia and hypercapnia (HCC) 11/05/2018   Allergy    ASCUS on Pap smear    had repeat x 2 normal with [name] Plan 2011   Coronary artery disease    Gout    Hyperlipidemia    Hypertension    Myocardial infarction [facility])    2002 95% proximal LAD stenosis with thrombus followed by 90% stenosis, the first diagonal 80% stenosis, circumflex 30% stenosis, circumflex obtuse marginal subbranch 70% stenosis, PDA 40% stenosis. She had a drug-eluting stent placement with a Pixel stent   Osteopenia    Solitary kidney    Past Surgical History:  Procedure Laterality Date   CARDIAC CATHETERIZATION     CORONARY ANGIOPLASTY     CORONARY ANGIOPLASTY WITH STENT PLACEMENT  2002   HEMORRHOID SURGERY  2011   LAD Stent angioplasty  2002   Right oophorectomy with salpingectomy     benign growth Dr. Joneen Caraway   Family History  Problem Relation Age of Onset   Breast cancer Mother 39   Lung cancer Father    Heart attack Brother        Died age 61 MI   Colon cancer Brother    Heart disease Daughter 36       smoker and overweight    Esophageal cancer Neg Hx    Pancreatic cancer Neg Hx    Rectal cancer Neg Hx    Stomach cancer Neg Hx    Social History   Socioeconomic History   Marital status: Single    Spouse name: Not on file   Number of children: 2   Years of education: Not on file   Highest education level: Not on file  Occupational History   Occupation: semi retired    Fish farm manager: Clinical research associate  Tobacco Use   Smoking status: Former    Packs/day: 1.00    Years: 39.00    Pack years: 39.00    Types: Cigarettes    Quit date:  11/04/2018    Years since quitting: 2.4   Smokeless tobacco: Never  Vaping Use   Vaping Use: Never used  Substance and Sexual Activity   Alcohol use: Yes    Comment: 1-2 per month   Drug use: No   Sexual activity: Not on file  Other Topics Concern   Not on file  Social History Narrative   Lives alone   Left Handed   Drinks 1-2 cups caffeine daily   Social Determinants of Health   Financial Resource Strain: Medium Risk   Difficulty of Paying Living Expenses: Somewhat hard  Food Insecurity: No Food  Insecurity   Worried About Charity fundraiser in the Last Year: Never true   [place] in the Last Year: Never true  Transportation Needs: No Transportation Needs   Lack of Transportation (Medical): No   Lack of Transportation (Non-Medical): No  Physical Activity: Insufficiently Active   Days of Exercise per Week: 5 days   Minutes of Exercise per Session: 20 min  Stress: No Stress Concern Present   Feeling of Stress : Only a little  Social Connections: Moderately Isolated   Frequency of Communication with Friends and Family: Twice a week   Frequency of Social Gatherings with Friends and Family: Twice a week   Attends Religious Services: More than 4 times per year   Active Member of [employer] or Organizations: No   Attends Music therapist: Never   Marital Status: Divorced    Tobacco Counseling Counseling given: Not Answered   Clinical Intake:  Pre-visit preparation completed: Yes  Pain : No/denies pain     Nutritional Risks: None Diabetes: Yes CBG done?: No Did pt. bring in CBG monitor from home?: No  How often do you need to have someone help you when you read instructions, pamphlets, or other written materials from your doctor or pharmacy?: 1 - Never What is the last grade level you completed in school?: College  Diabetic?yes Nutrition Risk Assessment:  Has the patient had any N/V/D within the last 2 months?  No  Does the patient have any  non-healing wounds?  No  Has the patient had any unintentional weight loss or weight gain?  No   Diabetes:  Is the patient diabetic?  Yes  If diabetic, was a CBG obtained today?  No  Did the patient bring in their glucometer from home?  No  How often do you monitor your CBG's? 2 x day   Financial Strains and Diabetes Management:  Are you having any financial strains with the device, your supplies or your medication? Yes .  Does the patient want to be seen by Chronic Care Management for management of their diabetes?  Yes  Would the patient like to be referred to a Nutritionist or for Diabetic Management?  Yes   Diabetic Exams:  Diabetic Eye Exam: Completed 04/2020 Diabetic Foot Exam: Overdue, Pt has been advised about the importance in completing this exam. Pt is scheduled for diabetic foot exam on next office visit .   Interpreter Needed?: No  Information entered by :: L.Brock Larmon,LPN   Activities of Daily Living No flowsheet data found.  Patient Care Team: Panosh, Standley Brooking, MD as PCP - General Minus Breeding, MD as PCP - Cardiology (Cardiology) Paula Compton, MD as Attending Physician (Obstetrics and Gynecology) Irene Shipper, MD as Attending Physician (Gastroenterology) Sharyne Peach, MD as Consulting Physician (Ophthalmology) Minus Breeding, MD as Consulting Physician (Cardiology) Rigoberto Noel, MD as Consulting Physician (Pulmonary Disease) Izora Gala, MD as Consulting Physician (Otolaryngology) Viona Gilmore, [facility] as Pharmacist (Pharmacist)  Indicate any recent Medical Services you may have received from other than [facility] providers in the past year (date may be approximate).     Assessment:   This is a routine wellness examination for [facility].  Hearing/Vision screen Vision Screening - Comments:: Annual eye exams   Dietary issues and exercise activities discussed:     Goals Addressed             This Visit's Progress    Patient Stated   On track  Increase exercise and lose weight        Depression Screen PHQ 2/9 Scores 03/30/2021 03/22/2020 01/28/2019 03/10/2017 04/26/2016 04/27/2015 04/18/2014  PHQ - 2 Score 0 0 0 0 0 0 0  PHQ- 9 Score - - 0 - - - -    Fall Risk Fall Risk  03/30/2021 03/27/2021 03/22/2020 01/28/2019 03/10/2017  Falls in the past year? 0 0 0 0 No  Number falls in past yr: 0 - 0 - -  Injury with Fall? 0 - 0 - -  Risk for fall due to : - - Impaired mobility;Impaired vision - -  Follow up Falls evaluation completed - Falls prevention discussed Falls prevention discussed -    FALL RISK PREVENTION PERTAINING TO THE HOME:  Any Mcintyre in or around the home? No  If so, are there any without handrails? No  Home free of loose throw rugs in walkways, pet beds, electrical cords, etc? Yes  Adequate lighting in your home to reduce risk of falls? Yes   ASSISTIVE DEVICES UTILIZED TO PREVENT FALLS:  Life alert? No  Use of a cane, walker or w/c? No  Grab bars in the bathroom? Yes  Shower chair or bench in shower? Yes  Elevated toilet seat or a handicapped toilet? No   Cognitive Function:  Normal cognitive status assessed by direct observation by this Nurse Health Advisor. No abnormalities found.     6CIT Screen 03/22/2020  What Year? 0 points  What month? 0 points  Count back from 20 0 points  Months in reverse 0 points  Repeat phrase 0 points    Immunizations Immunization History  Administered Date(s) Administered   Fluad Quad(high Dose 65+) 02/11/2019, 02/29/2020   Influenza Split 04/29/2011, 04/08/2012   Influenza Whole 02/16/2008, 03/15/2009, 03/20/2010   Influenza, High Dose Seasonal PF 04/14/2013, 02/14/2015, 02/28/2016, 03/10/2017, 03/25/2018   Influenza,inj,Quad PF,6+ Mos 04/18/2014   Influenza,inj,quad, With Preservative 02/25/2019   Influenza-Unspecified 02/24/2017   PFIZER(Purple Top)SARS-COV-2 Vaccination 06/15/2019, 07/06/2019, 04/13/2020   Pneumococcal Conjugate-13 04/08/2012   Pneumococcal  Polysaccharide-23 04/14/2013   Td 09/14/2007   Zoster, Live 09/14/2007    TDAP status: Due, Education has been provided regarding the importance of this vaccine. Advised may receive this vaccine at local pharmacy or Health Dept. Aware to provide a copy of the vaccination record if obtained from local pharmacy or Health Dept. Verbalized acceptance and understanding.  Flu Vaccine status: Up to date  Pneumococcal vaccine status: Up to date  Covid-19 vaccine status: Completed vaccines  Qualifies for Shingles Vaccine? Yes   Zostavax completed Yes   Shingrix Completed?: No.    Education has been provided regarding the importance of this vaccine. Patient has been advised to call insurance company to determine out of pocket expense if they have not yet received this vaccine. Advised may also receive vaccine at local pharmacy or Health Dept. Verbalized acceptance and understanding.  Screening Tests Health Maintenance  Topic Date Due   Zoster Vaccines- Shingrix (1 of 2) Never done   TETANUS/TDAP  09/13/2017   COVID-19 Vaccine (4 - Booster for Pfizer series) 06/08/2020   INFLUENZA VACCINE  12/25/2020   URINE MICROALBUMIN  03/27/2021   FOOT EXAM  04/03/2021   HEMOGLOBIN A1C  04/05/2021   OPHTHALMOLOGY EXAM  05/08/2021   COLONOSCOPY (Pts 45-30yr Insurance coverage will need to be confirmed)  12/25/2021   MAMMOGRAM  03/08/2022   Pneumonia Vaccine 75 Years old  Completed   DEXA SCAN  Completed   Hepatitis C Screening  Completed   HPV VACCINES  Aged Out    Health Maintenance  Health Maintenance Due  Topic Date Due   Zoster Vaccines- Shingrix (1 of 2) Never done   TETANUS/TDAP  09/13/2017   COVID-19 Vaccine (4 - Booster for Pfizer series) 06/08/2020   INFLUENZA VACCINE  12/25/2020   URINE MICROALBUMIN  03/27/2021    Colorectal cancer screening: Type of screening: Colonoscopy. Completed 12/25/2016. Repeat every 5 years  Mammogram status: Completed 03/08/2021. Repeat every  year  Bone Density status: Completed 04/14/2013. Results reflect: Bone density results: OSTEOPENIA. Repeat every 5 years.  Lung Cancer Screening: (Low Dose CT Chest recommended if Age 38-80 years, 30 pack-year currently smoking OR have quit w/in 15years.) does not qualify.   Lung Cancer Screening Referral: n/a  Additional Screening:  Hepatitis C Screening: does not qualify; Completed 03/10/2017  Vision Screening: Recommended annual ophthalmology exams for early detection of glaucoma and other disorders of the eye. Is the patient up to date with their annual eye exam?  Yes  Who is the provider or what is the name of the office in which the patient attends annual eye exams? Dr. Delman Cheadle  If pt is not established with a provider, would they like to be referred to a provider to establish care? No .   Dental Screening: Recommended annual dental exams for proper oral hygiene  Community Resource Referral / Chronic Care Management: CRR required this visit?  No   CCM required this visit?  No      Plan:     I have personally reviewed and noted the following in the patient's chart:   Medical and social history Use of alcohol, tobacco or illicit drugs  Current medications and supplements including opioid prescriptions.  Functional ability and status Nutritional status Physical activity Advanced directives List of other physicians Hospitalizations, surgeries, and ER visits in previous 12 months Vitals Screenings to include cognitive, depression, and falls Referrals and appointments  In addition, I have reviewed and discussed with patient certain preventive protocols, quality metrics, and best practice recommendations. A written personalized care plan for preventive services as well as general preventive health recommendations were provided to patient.     Randel Pigg, LPN   [birth year]   Nurse Notes: none

## 2021-03-30 NOTE — Patient Instructions (Signed)
Rebecca Mcintyre , Thank you for taking time to come for your Medicare Wellness Visit. I appreciate your ongoing commitment to your health goals. Please review the following plan we discussed and let me know if I can assist you in the future.   Screening recommendations/referrals: Colonoscopy: 01/11/2017  due 2023  Mammogram: 03/08/2021 Bone Density: 04/14/2013 Recommended yearly ophthalmology/optometry visit for glaucoma screening and checkup Recommended yearly dental visit for hygiene and checkup  Vaccinations: Influenza vaccine: completed  Pneumococcal vaccine: completed  Tdap vaccine: due will obtain  Shingles vaccine: will consider     Advanced directives: none   Conditions/risks identified: none   Next appointment: none    Preventive Care 74 Years and Older, Female Preventive care refers to lifestyle choices and visits with your health care provider that can promote health and wellness. What does preventive care include? A yearly physical exam. This is also called an annual well check. Dental exams once or twice a year. Routine eye exams. Ask your health care provider how often you should have your eyes checked. Personal lifestyle choices, including: Daily care of your teeth and gums. Regular physical activity. Eating a healthy diet. Avoiding tobacco and drug use. Limiting alcohol use. Practicing safe sex. Taking low-dose aspirin every day. Taking vitamin and mineral supplements as recommended by your health care provider. What happens during an annual well check? The services and screenings done by your health care provider during your annual well check will depend on your age, overall health, lifestyle risk factors, and family history of disease. Counseling  Your health care provider may ask you questions about your: Alcohol use. Tobacco use. Drug use. Emotional well-being. Home and relationship well-being. Sexual activity. Eating habits. History of falls. Memory  and ability to understand (cognition). Work and work Statistician. Reproductive health. Screening  You may have the following tests or measurements: Height, weight, and BMI. Blood pressure. Lipid and cholesterol levels. These may be checked every 5 years, or more frequently if you are over 47 years old. Skin check. Lung cancer screening. You may have this screening every year starting at age 34 if you have a 30-pack-year history of smoking and currently smoke or have quit within the past 15 years. Fecal occult blood test (FOBT) of the stool. You may have this test every year starting at age 102. Flexible sigmoidoscopy or colonoscopy. You may have a sigmoidoscopy every 5 years or a colonoscopy every 10 years starting at age 32. Hepatitis C blood test. Hepatitis B blood test. Sexually transmitted disease (STD) testing. Diabetes screening. This is done by checking your blood sugar (glucose) after you have not eaten for a while (fasting). You may have this done every 1-3 years. Bone density scan. This is done to screen for osteoporosis. You may have this done starting at age 14. Mammogram. This may be done every 1-2 years. Talk to your health care provider about how often you should have regular mammograms. Talk with your health care provider about your test results, treatment options, and if necessary, the need for more tests. Vaccines  Your health care provider may recommend certain vaccines, such as: Influenza vaccine. This is recommended every year. Tetanus, diphtheria, and acellular pertussis (Tdap, Td) vaccine. You may need a Td booster every 10 years. Zoster vaccine. You may need this after age 49. Pneumococcal 13-valent conjugate (PCV13) vaccine. One dose is recommended after age 31. Pneumococcal polysaccharide (PPSV23) vaccine. One dose is recommended after age 90. Talk to your health care provider about which screenings  and vaccines you need and how often you need them. This  information is not intended to replace advice given to you by your health care provider. Make sure you discuss any questions you have with your health care provider. Document Released: 06/09/2015 Document Revised: 01/31/2016 Document Reviewed: 03/14/2015 Elsevier Interactive Patient Education  2017 [place] Prevention in the Home Falls can cause injuries. They can happen to people of all ages. There are many things you can do to make your home safe and to help prevent falls. What can I do on the outside of my home? Regularly fix the edges of walkways and driveways and fix any cracks. Remove anything that might make you trip as you walk through a door, such as a raised step or threshold. Trim any bushes or trees on the path to your home. Use bright outdoor lighting. Clear any walking paths of anything that might make someone trip, such as rocks or tools. Regularly check to see if handrails are loose or broken. Make sure that both sides of any steps have handrails. Any raised decks and porches should have guardrails on the edges. Have any leaves, snow, or ice cleared regularly. Use sand or salt on walking paths during winter. Clean up any spills in your garage right away. This includes oil or grease spills. What can I do in the bathroom? Use night lights. Install grab bars by the toilet and in the tub and shower. Do not use towel bars as grab bars. Use non-skid mats or decals in the tub or shower. If you need to sit down in the shower, use a plastic, non-slip stool. Keep the floor dry. Clean up any water that spills on the floor as soon as it happens. Remove soap buildup in the tub or shower regularly. Attach bath mats securely with double-sided non-slip rug tape. Do not have throw rugs and other things on the floor that can make you trip. What can I do in the bedroom? Use night lights. Make sure that you have a light by your bed that is easy to reach. Do not use any sheets or  blankets that are too big for your bed. They should not hang down onto the floor. Have a firm chair that has side arms. You can use this for support while you get dressed. Do not have throw rugs and other things on the floor that can make you trip. What can I do in the kitchen? Clean up any spills right away. Avoid walking on wet floors. Keep items that you use a lot in easy-to-reach places. If you need to reach something above you, use a strong step stool that has a grab bar. Keep electrical cords out of the way. Do not use floor polish or wax that makes floors slippery. If you must use wax, use non-skid floor wax. Do not have throw rugs and other things on the floor that can make you trip. What can I do with my stairs? Do not leave any items on the stairs. Make sure that there are handrails on both sides of the stairs and use them. Fix handrails that are broken or loose. Make sure that handrails are as long as the stairways. Check any carpeting to make sure that it is firmly attached to the stairs. Fix any carpet that is loose or worn. Avoid having throw rugs at the top or bottom of the stairs. If you do have throw rugs, attach them to the floor with carpet tape.  Make sure that you have a light switch at the top of the stairs and the bottom of the stairs. If you do not have them, ask someone to add them for you. What else can I do to help prevent falls? Wear shoes that: Do not have high heels. Have rubber bottoms. Are comfortable and fit you well. Are closed at the toe. Do not wear sandals. If you use a stepladder: Make sure that it is fully opened. Do not climb a closed stepladder. Make sure that both sides of the stepladder are locked into place. Ask someone to hold it for you, if possible. Clearly mark and make sure that you can see: Any grab bars or handrails. First and last steps. Where the edge of each step is. Use tools that help you move around (mobility aids) if they are  needed. These include: Canes. Walkers. Scooters. Crutches. Turn on the lights when you go into a dark area. Replace any light bulbs as soon as they burn out. Set up your furniture so you have a clear path. Avoid moving your furniture around. If any of your floors are uneven, fix them. If there are any pets around you, be aware of where they are. Review your medicines with your doctor. Some medicines can make you feel dizzy. This can increase your chance of falling. Ask your doctor what other things that you can do to help prevent falls. This information is not intended to replace advice given to you by your health care provider. Make sure you discuss any questions you have with your health care provider. Document Released: 03/09/2009 Document Revised: 10/19/2015 Document Reviewed: 06/17/2014 Elsevier Interactive Patient Education  2017 [employer].

## 2021-04-02 ENCOUNTER — Telehealth: Payer: Self-pay | Admitting: Pharmacist

## 2021-04-02 NOTE — Telephone Encounter (Signed)
Called [employer] and was told patient's income is too high but reported income on file was very different from patient reported. Requested for patient to drop off income documents (1040 and SSI income) copies to fax off to [employer].  Set aside 2 weeks of samples until able to sort out medication assistance. Patient is aware to pick them up and will drop off income forms at the same time.

## 2021-04-02 NOTE — Telephone Encounter (Signed)
Patient called as she is running out of [place] in 4 days. Made patient aware that a fax was sent from [employer] saying she was denied PAP due to her income.   Discussed with patient about other options for medications including GLP1s (Ozempic and Rybelsus). Educated on the side effects and mechanism of action and patient plans to look into these options further.  Upon reviewing her income information, patient should qualify for patient assistance as her income is < 200% of the [employer].   Will call [employer] to find out more information about her denial or if this can be appealed.

## 2021-04-06 ENCOUNTER — Ambulatory Visit (INDEPENDENT_AMBULATORY_CARE_PROVIDER_SITE_OTHER): Payer: Medicare Other

## 2021-04-06 DIAGNOSIS — E1165 Type 2 diabetes mellitus with hyperglycemia: Secondary | ICD-10-CM

## 2021-04-06 DIAGNOSIS — J449 Chronic obstructive pulmonary disease, unspecified: Secondary | ICD-10-CM

## 2021-04-06 DIAGNOSIS — I509 Heart failure, unspecified: Secondary | ICD-10-CM

## 2021-04-06 DIAGNOSIS — I10 Essential (primary) hypertension: Secondary | ICD-10-CM

## 2021-04-06 NOTE — Patient Instructions (Signed)
Visit Information  Heart Failure Action Plan A heart failure action plan helps you understand what to do when you have symptoms of heart failure. Your action plan is a color-coded plan that lists the symptoms to watch for and indicates what actions to take. If you have symptoms in the red zone, you need medical care right away. If you have symptoms in the yellow zone, you are having problems. If you have symptoms in the green zone, you are doing well. Follow the plan that was created by you and your health care provider. Review your plan each time you visit your health care provider. Red zone These signs and symptoms mean you should get medical help right away: You have trouble breathing when resting. You have a dry cough that is getting worse. You have swelling or pain in your legs or abdomen that is getting worse. You suddenly gain more than 2-3 lb (0.9-1.4 kg) in 24 hours, or more than 5 lb (2.3 kg) in a week. This amount may be more or less depending on your condition. You have trouble staying awake or you feel confused. You have chest pain. You do not have an appetite. You pass out. You have worsening sadness or depression. If you have any of these symptoms, call your local emergency services (911 in the [place] right away. Do not drive yourself to the hospital. Yellow zone These signs and symptoms mean your condition may be getting worse and you should make some changes: You have trouble breathing when you are active, or you need to sleep with your head raised on extra pillows to help you breathe. You have swelling in your legs or abdomen. You gain 2-3 lb (0.9-1.4 kg) in 24 hours, or 5 lb (2.3 kg) in a week. This amount may be more or less depending on your condition. You get tired easily. You have trouble sleeping. You have a dry cough. If you have any of these symptoms: Contact your health care provider within the next day. Your health care provider may adjust your  medicines. Green zone These signs mean you are doing well and can continue what you are doing: You do not have shortness of breath. You have very little swelling or no new swelling. Your weight is stable (no gain or loss). You have a normal activity level. You do not have chest pain or any other new symptoms. Follow these instructions at home: Take over-the-counter and prescription medicines only as told by your health care provider. Weigh yourself daily. Your target weight is __________ lb (__________ kg). Call your health care provider if you gain more than __________ lb (__________ kg) in 24 hours, or more than __________ lb (__________ kg) in a week. Health care provider name: _____________________________________________________ Health care provider phone number: _____________________________________________________ Eat a heart-healthy diet. Work with a diet and nutrition specialist (dietitian) to create an eating plan that is best for you. Keep all follow-up visits. This is important. Where to find more information American Heart Association: www.heart.org Summary A heart failure action plan helps you understand what to do when you have symptoms of heart failure. Follow the action plan that was created by you and your health care provider. Get help right away if you have any symptoms in the red zone. This information is not intended to replace advice given to you by your health care provider. Make sure you discuss any questions you have with your health care provider. Document Revised: 12/27/2019 Document Reviewed: 12/27/2019 Elsevier Patient Education  2022 [employer] Chronic Obstructive Pulmonary Disease Chronic obstructive pulmonary disease (COPD) is a long-term (chronic) lung problem. When you have COPD, it is hard for air to get in and out of your lungs. Usually the condition gets worse over time, and your lungs will never return to normal. There are things you can do to  keep yourself as healthy as possible. What are the causes? Smoking. This is the most common cause. Certain genes passed from parent to child (inherited). What increases the risk? Being exposed to secondhand smoke from cigarettes, pipes, or cigars. Being exposed to chemicals and other irritants, such as fumes and dust in the work environment. Having chronic lung conditions or infections. What are the signs or symptoms? Shortness of breath, especially during physical activity. A long-term cough with a large amount of thick mucus. Sometimes, the cough may not have any mucus (dry cough). Wheezing. Breathing quickly. Skin that looks gray or blue, especially in the fingers, toes, or lips. Feeling tired (fatigue). Weight loss. Chest tightness. Having infections often. Episodes when breathing symptoms become much worse (exacerbations). At the later stages of this disease, you may have swelling in the ankles, feet, or legs. How is this treated? Taking medicines. Quitting smoking, if you smoke. Rehabilitation. This includes steps to make your body work better. It may involve a team of specialists. Doing exercises. Making changes to your diet. Using oxygen. Lung surgery. Lung transplant. Comfort measures (palliative care). Follow these instructions at home: Medicines Take over-the-counter and prescription medicines only as told by your doctor. Talk to your doctor before taking any cough or allergy medicines. You may need to avoid medicines that cause your lungs to be dry. Lifestyle If you smoke, stop smoking. Smoking makes the problem worse. Do not smoke or use any products that contain nicotine or tobacco. If you need help quitting, ask your doctor. Avoid being around things that make your breathing worse. This may include smoke, chemicals, and fumes. Stay active, but remember to rest as well. Learn and use tips on how to manage stress and control your breathing. Make sure you get  enough sleep. Most adults need at least 7 hours of sleep every night. Eat healthy foods. Eat smaller meals more often. Rest before meals. Controlled breathing Learn and use tips on how to control your breathing as told by your doctor. Try: Breathing in (inhaling) through your nose for 1 second. Then, pucker your lips and breath out (exhale) through your lips for 2 seconds. Putting one hand on your belly (abdomen). Breathe in slowly through your nose for 1 second. Your hand on your belly should move out. Pucker your lips and breathe out slowly through your lips. Your hand on your belly should move in as you breathe out.  Controlled coughing Learn and use controlled coughing to clear mucus from your lungs. Follow these steps: Lean your head a little forward. Breathe in deeply. Try to hold your breath for 3 seconds. Keep your mouth slightly open while coughing 2 times. Spit any mucus out into a tissue. Rest and do the steps again 1 or 2 times as needed. General instructions Make sure you get all the shots (vaccines) that your doctor recommends. Ask your doctor about a flu shot and a pneumonia shot. Use oxygen therapy and pulmonary rehabilitation if told by your doctor. If you need home oxygen therapy, ask your doctor if you should buy a tool to measure your oxygen level (oximeter). Make a COPD action plan with your doctor.  This helps you to know what to do if you feel worse than usual. Manage any other conditions you have as told by your doctor. Avoid going outside when it is very hot, cold, or humid. Avoid people who have a sickness you can catch (contagious). Keep all follow-up visits. Contact a doctor if: You cough up more mucus than usual. There is a change in the color or thickness of the mucus. It is harder to breathe than usual. Your breathing is faster than usual. You have trouble sleeping. You need to use your medicines more often than usual. You have trouble doing your normal  activities such as getting dressed or walking around the house. Get help right away if: You have shortness of breath while resting. You have shortness of breath that stops you from: Being able to talk. Doing normal activities. Your chest hurts for longer than 5 minutes. Your skin color is more blue than usual. Your pulse oximeter shows that you have low oxygen for longer than 5 minutes. You have a fever. You feel too tired to breathe normally. These symptoms may represent a serious problem that is an emergency. Do not wait to see if the symptoms will go away. Get medical help right away. Call your local emergency services (911 in the [place] Do not drive yourself to the hospital. Summary Chronic obstructive pulmonary disease (COPD) is a long-term lung problem. The way your lungs work will never return to normal. Usually the condition gets worse over time. There are things you can do to keep yourself as healthy as possible. Take over-the-counter and prescription medicines only as told by your doctor. If you smoke, stop. Smoking makes the problem worse. This information is not intended to replace advice given to you by your health care provider. Make sure you discuss any questions you have with your health care provider. Document Revised: 03/21/2020 Document Reviewed: 03/21/2020 Elsevier Patient Education  2022 [place].  PATIENT GOALS/PLAN OF CARE:  Care Plan : RN Care Manager Plan of Care  Updates made by Dimitri Ped, RN since 04/06/2021 12:00 AM     Problem: Chronic Disease Management and Care Coordination Needs (COPD,DM2,CHF,HTN)   Priority: High     Long-Range Goal: Establish Plan of Care for Chronic Disease Management Needs (HTN   Start Date: 04/06/2021  Expected End Date: 10/03/2021  This Visit's Progress: On track  Priority: High  Note:   Current Barriers:  Care Coordination needs related to Financial constraints related to medication in coverage gap Chronic  Disease Management support and education needs related to CHF, HTN, COPD, and DMII Financial Constraints States that she took her last [place] today.  States she can not afford the [place] when she is in the doughnut hole.  States she gets short of breath when she walks.  States she wears her O2 at all times.  Denies any chest pains or swelling.  States she weights daily with her wt today of 240.  States she checks her CBG twice a day with fasting readings of 112-123 and at bedtime 136-152. RNCM Clinical Goal(s):  Patient will verbalize basic understanding of  CHF, HTN, COPD, and DMII disease process and self health management plan . take all medications exactly as prescribed and will call provider for medication related questions attend all scheduled medical appointments: Dr.Panosh 04/10/21 demonstrate Improved adherence to prescribed treatment plan for CHF, HTN, COPD, and DMII as evidenced by readings within limits, adherence to plan of care work with pharmacist to address  Financial constraints related to [place] related toCHF, HTN, COPD, and DMII through collaboration with Consulting civil engineer, provider, and care team.   Interventions: 1:1 collaboration with primary care provider regarding development and update of comprehensive plan of care as evidenced by provider attestation and co-signature Inter-disciplinary care team collaboration (see longitudinal plan of care) Evaluation of current treatment plan related to  self management and patient's adherence to plan as established by provider  Hypertension Interventions: New goal. Last practice recorded BP readings:  BP Readings from Last 3 Encounters:  01/16/21 126/73  01/01/21 100/60  12/25/20 116/70  Most recent eGFR/CrCl: No results found for: EGFR  No components found for: CRCL  Provided education to patient re: stroke prevention, s/s of heart attack and stroke; Reviewed medications with patient and discussed importance of  compliance; Discussed plans with patient for ongoing care management follow up and provided patient with direct contact information for care management team; Advised patient, providing education and rationale, to monitor blood pressure daily and record, calling PCP for findings outside established parameters;   Diabetes Interventions: New goal. Assessed patient's understanding of A1c goal: <7% Provided education to patient about basic DM disease process; Reviewed medications with patient and discussed importance of medication adherence; Counseled on importance of regular laboratory monitoring as prescribed; Discussed plans with patient for ongoing care management follow up and provided patient with direct contact information for care management team; Advised patient, providing education and rationale, to check cbg twice a day and record, calling provider for findings outside established parameters; Referral made to pharmacy team for assistance with cost of [place] currently engaged with CCM pharmacist; Review of patient status, including review of consultants reports, relevant laboratory and other test results, and medications completed; Communicated to CCM pharmacist of pts need to [place].  Communicated that pt has 2 weeks sample supply at the office to pick up and she also needs pts updated income verification to complete financial application Pt states she will go pick up samples today and bring income information Lab Results  Component Value Date   HGBA1C 7.4 (H) 10/03/2020   COPD Interventions:   New goal. Provided patient with basic written and verbal COPD education on self care/management/and exacerbation prevention; Advised patient to track and manage COPD triggers;  Provided instruction about proper use of medications used for management of COPD including inhalers; Advised patient to self assesses COPD action plan zone and make appointment with provider if in the yellow zone for 48  hours without improvement; Advised patient to engage in light exercise as tolerated 3-5 days a week to aid in the the management of COPD;  Heart Failure Interventions: New goal. Basic overview and discussion of pathophysiology of Heart Failure reviewed; Provided education on low sodium diet; Reviewed Heart Failure Action Plan in depth and provided written copy; Assessed need for readable accurate scales in home; Discussed importance of daily weight and advised patient to weigh and record daily; Reviewed role of diuretics in prevention of fluid overload and management of heart failure; Discussed the importance of keeping all appointments with provider;  Patient Goals/Self-Care Activities: Patient will self administer medications as prescribed Patient will attend all scheduled provider appointments Patient will call pharmacy for medication refills Patient will call provider office for new concerns or questions call office if I gain more than 2 pounds in one day or 5 pounds in one week keep legs up while sitting track weight in diary use salt in moderation watch for swelling in feet, ankles and legs  every day weigh myself daily follow rescue plan if symptoms flare-up eat more whole grains, fruits and vegetables, lean meats and healthy fats check blood sugar at prescribed times: twice daily check feet daily for cuts, sores or redness take the blood sugar meter to all doctor visits fill half of plate with vegetables manage portion size read food labels for fat, fiber, carbohydrates and portion size wash and dry feet carefully every day - identify and remove indoor air pollutants - listen for public air quality announcements every day - follow rescue plan if symptoms flare-up - keep follow-up appointments:  - get at least 7 to 8 hours of sleep at night - do exercises in a comfortable position that makes breathing as easy as possible - check blood pressure 3 times per week - choose a  place to take my blood pressure (home, clinic or office, retail store) - write blood pressure results in a log or diary - keep all doctor appointments - limit salt intake to 2337m/day  Follow Up Plan:  Telephone follow up appointment with care management team member scheduled for:  05/08/21 The patient has been provided with contact information for the care management team and has been advised to call with any health related questions or concerns.          Consent to CCM Services: Ms. TKooswas given information about Chronic Care Management services including:  CCM service includes personalized support from designated clinical staff supervised by her physician, including individualized plan of care and coordination with other care providers 24/7 contact phone numbers for assistance for urgent and routine care needs. Service will only be billed when office clinical staff spend 20 minutes or more in a month to coordinate care. Only one practitioner may furnish and bill the service in a calendar month. The patient may stop CCM services at any time (effective at the end of the month) by phone call to the office staff. The patient will be responsible for cost sharing (co-pay) of up to 20% of the service fee (after annual deductible is met).  Patient agreed to services and verbal consent obtained.   Patient verbalizes understanding of instructions provided today and [place]   Telephone follow up appointment with care management team member scheduled for: 05/08/21 at 9TedrowRN, [facility] CDE Care Management Coordinator [facility] ([phone] Mobile ([phone]

## 2021-04-06 NOTE — Chronic Care Management (AMB) (Signed)
Chronic Care Management   CCM RN Visit Note  04/06/2021 Name: Rebecca Mcintyre MRN: [id number] DOB: September 08, 1945  Subjective: Rebecca Mcintyre is a 75 y.o. year old female who is a primary care patient of Panosh, Standley Brooking, MD. The care management team was consulted for assistance with disease management and care coordination needs.    Engaged with patient by telephone for initial visit in response to provider referral for case management and/or care coordination services.   Consent to Services:  The patient was given the following information about Chronic Care Management services today, agreed to services, and gave verbal consent: 1. CCM service includes personalized support from designated clinical staff supervised by the primary care provider, including individualized plan of care and coordination with other care providers 2. 24/7 contact phone numbers for assistance for urgent and routine care needs. 3. Service will only be billed when office clinical staff spend 20 minutes or more in a month to coordinate care. 4. Only one practitioner may furnish and bill the service in a calendar month. 5.The patient may stop CCM services at any time (effective at the end of the month) by phone call to the office staff. 6. The patient will be responsible for cost sharing (co-pay) of up to 20% of the service fee (after annual deductible is met). Patient agreed to services and consent obtained.  Patient agreed to services and verbal consent obtained.   Assessment: Review of patient past medical history, allergies, medications, health status, including review of consultants reports, laboratory and other test data, was performed as part of comprehensive evaluation and provision of chronic care management services.   SDOH (Social Determinants of Health) assessments and interventions performed:  SDOH Interventions    Flowsheet Row Most Recent Value  SDOH Interventions   Food Insecurity Interventions Intervention Not  Indicated  Financial Strain Interventions --  [working with pharmacist for assistance with [place] Interventions Intervention Not Indicated  Stress Interventions Intervention Not Indicated  Transportation Interventions Intervention Not Indicated        CCM Care Plan  Allergies  Allergen Reactions   Atorvastatin     REACTION: myalgias  and liver    Outpatient Encounter Medications as of 04/06/2021  Medication Sig   Accu-Chek Softclix Lancets lancets Use as instructed twice a day Diagnosis E11.9   acetaminophen (TYLENOL) 500 MG tablet Take 1,000 mg by mouth every 6 (six) hours as needed for mild pain.   albuterol (PROVENTIL) (2.5 MG/3ML) 0.083% nebulizer solution Take 3 mLs (2.5 mg total) by nebulization every 6 (six) hours as needed for wheezing or shortness of breath.   allopurinol (ZYLOPRIM) 100 MG tablet TAKE 1 TABLET BY MOUTH TWICE A DAY   aspirin 81 MG tablet Take 81 mg by mouth daily.   Blood Glucose Monitoring Suppl (ACCU-CHEK GUIDE) w/Device KIT Use as directed twice a day Diagnosis E11.9   dapagliflozin propanediol (FARXIGA) 10 MG TABS tablet Take 1 tablet (10 mg total) by mouth daily before breakfast.   dapagliflozin propanediol (FARXIGA) 10 MG TABS tablet Take 1 tablet (10 mg total) by mouth daily before breakfast.   furosemide (LASIX) 40 MG tablet TAKE 1 TABLET BY MOUTH EVERY DAY   glucose blood (ACCU-CHEK GUIDE) test strip Use as instructed twice a day Diagnosis E11.9   ketoconazole (NIZORAL) 2 % cream Apply 1 application topically 2 (two) times daily.   KLOR-CON M10 10 MEQ tablet TAKE 1 TABLET BY MOUTH 2 TIMES DAILY.   metFORMIN (GLUCOPHAGE) 500 MG tablet  TAKE 1 TABLET BY MOUTH TWICE A DAY WITH A MEAL INCREASE TO 2 TABLETS TWICE A DAY WITH A MEAL   metoprolol tartrate (LOPRESSOR) 25 MG tablet TAKE 1/2 TABLETS BY MOUTH 2 TIMES DAILY   montelukast (SINGULAIR) 10 MG tablet TAKE 1 TABLET (10 MG TOTAL) AT BEDTIME BY MOUTH.   nitroGLYCERIN (NITROSTAT) 0.4 MG SL  tablet PLACE 1 TABLET UNDER THE TONGUE EVERY 5 MINUTES AS NEEDED.   predniSONE (DELTASONE) 10 MG tablet Take 4 tabs by mouth once daily x4 days, then 3 tabs x4 days, 2 tabs x4 days, 1 tab x4 days and stop. (Patient not taking: Reported on 03/30/2021)   PROAIR HFA 108 (90 Base) MCG/ACT inhaler TAKE 2 PUFFS BY MOUTH EVERY 6 HOURS AS NEEDED FOR WHEEZE OR SHORTNESS OF BREATH   rOPINIRole (REQUIP) 0.5 MG tablet 1 TAB AT BEDTIME FOR RESTLESS LEG   simvastatin (ZOCOR) 40 MG tablet Take 1 tablet (40 mg total) by mouth at bedtime.   TRELEGY ELLIPTA 100-62.5-25 MCG/ACT AEPB TAKE 1 PUFF BY MOUTH EVERY DAY   No facility-administered encounter medications on file as of 04/06/2021.    Patient Active Problem List   Diagnosis Date Noted   Elevated troponin 11/28/2020   Anterior communicating artery aneurysm 11/28/2020   Thyroid nodule 11/28/2020   Contusion of right foot    Educated about COVID-19 virus infection 08/04/2019   COPD (chronic obstructive pulmonary disease) ([place]) 12/29/2018   Restless leg syndrome 12/22/2018   Congestive heart failure ([place]) 12/13/2018   Acute on chronic diastolic CHF (congestive heart failure) ([place]) 11/05/2018   Acute CHF (congestive heart failure) ([place]) 11/04/2018   CHF (congestive heart failure) ([place]) 11/04/2018   Diabetes mellitus type II, non insulin dependent ([place]) 11/04/2018   Great toe pain, left 01/01/2018   Myocardial infarction [facility])    Elevated hemoglobin ([place]) 04/24/2014   Visit for preventive health examination 04/24/2014   Pre-diabetes 08/12/2012   Decreased hearing 04/12/2012   Welcome to Medicare preventive visit 04/08/2012   HYPERURICEMIA 03/15/2009   GOUT 12/13/2008   OBESITY 08/23/2008   OSTEOPENIA 09/01/2007   MYOCARDIAL INFARCTION, HX OF 07/14/2007   SOLITARY KIDNEY 07/14/2007   Hyperlipidemia 04/15/2007   Essential hypertension 04/15/2007   CAD (coronary artery disease) 04/15/2007   OTHER ABNORMAL GLUCOSE 04/15/2007   TOBACCO ABUSE, HX OF  04/15/2007    Conditions to be addressed/monitored:CHF, HTN, COPD, and DMII  Care Plan : RN Care Manager Plan of Care  Updates made by Dimitri Ped, RN since 04/06/2021 12:00 AM     Problem: Chronic Disease Management and Care Coordination Needs (COPD,DM2,CHF,HTN)   Priority: High     Long-Range Goal: Establish Plan of Care for Chronic Disease Management Needs (HTN   Start Date: 04/06/2021  Expected End Date: 10/03/2021  This Visit's Progress: On track  Priority: High  Note:   Current Barriers:  Care Coordination needs related to Financial constraints related to medication in coverage gap Chronic Disease Management support and education needs related to CHF, HTN, COPD, and DMII Financial Constraints States that she took her last [place] today.  States she can not afford the [place] when she is in the doughnut hole.  States she gets short of breath when she walks.  States she wears her O2 at all times.  Denies any chest pains or swelling.  States she weights daily with her wt today of 240.  States she checks her CBG twice a day with fasting readings of 112-123 and at bedtime 136-152. RNCM  Clinical Goal(s):  Patient will verbalize basic understanding of  CHF, HTN, COPD, and DMII disease process and self health management plan . take all medications exactly as prescribed and will call provider for medication related questions attend all scheduled medical appointments: Dr.Panosh 04/10/21 demonstrate Improved adherence to prescribed treatment plan for CHF, HTN, COPD, and DMII as evidenced by readings within limits, adherence to plan of care work with pharmacist to address Financial constraints related to [place] related toCHF, HTN, COPD, and DMII through collaboration with Consulting civil engineer, provider, and care team.   Interventions: 1:1 collaboration with primary care provider regarding development and update of comprehensive plan of care as evidenced by provider attestation and  co-signature Inter-disciplinary care team collaboration (see longitudinal plan of care) Evaluation of current treatment plan related to  self management and patient's adherence to plan as established by provider  Hypertension Interventions: New goal. Last practice recorded BP readings:  BP Readings from Last 3 Encounters:  01/16/21 126/73  01/01/21 100/60  12/25/20 116/70  Most recent eGFR/CrCl: No results found for: EGFR  No components found for: CRCL  Provided education to patient re: stroke prevention, s/s of heart attack and stroke; Reviewed medications with patient and discussed importance of compliance; Discussed plans with patient for ongoing care management follow up and provided patient with direct contact information for care management team; Advised patient, providing education and rationale, to monitor blood pressure daily and record, calling PCP for findings outside established parameters;   Diabetes Interventions: New goal. Assessed patient's understanding of A1c goal: <7% Provided education to patient about basic DM disease process; Reviewed medications with patient and discussed importance of medication adherence; Counseled on importance of regular laboratory monitoring as prescribed; Discussed plans with patient for ongoing care management follow up and provided patient with direct contact information for care management team; Advised patient, providing education and rationale, to check cbg twice a day and record, calling provider for findings outside established parameters; Referral made to pharmacy team for assistance with cost of [place] currently engaged with CCM pharmacist; Review of patient status, including review of consultants reports, relevant laboratory and other test results, and medications completed; Communicated to CCM pharmacist of pts need to [place].  Communicated that pt has 2 weeks sample supply at the office to pick up and she also needs pts updated  income verification to complete financial application Pt states she will go pick up samples today and bring income information Lab Results  Component Value Date   HGBA1C 7.4 (H) 10/03/2020   COPD Interventions:   New goal. Provided patient with basic written and verbal COPD education on self care/management/and exacerbation prevention; Advised patient to track and manage COPD triggers;  Provided instruction about proper use of medications used for management of COPD including inhalers; Advised patient to self assesses COPD action plan zone and make appointment with provider if in the yellow zone for 48 hours without improvement; Advised patient to engage in light exercise as tolerated 3-5 days a week to aid in the the management of COPD;  Heart Failure Interventions: New goal. Basic overview and discussion of pathophysiology of Heart Failure reviewed; Provided education on low sodium diet; Reviewed Heart Failure Action Plan in depth and provided written copy; Assessed need for readable accurate scales in home; Discussed importance of daily weight and advised patient to weigh and record daily; Reviewed role of diuretics in prevention of fluid overload and management of heart failure; Discussed the importance of keeping all appointments with provider;  Patient Goals/Self-Care Activities: Patient will self administer medications as prescribed Patient will attend all scheduled provider appointments Patient will call pharmacy for medication refills Patient will call provider office for new concerns or questions call office if I gain more than 2 pounds in one day or 5 pounds in one week keep legs up while sitting track weight in diary use salt in moderation watch for swelling in feet, ankles and legs every day weigh myself daily follow rescue plan if symptoms flare-up eat more whole grains, fruits and vegetables, lean meats and healthy fats check blood sugar at prescribed times: twice  daily check feet daily for cuts, sores or redness take the blood sugar meter to all doctor visits fill half of plate with vegetables manage portion size read food labels for fat, fiber, carbohydrates and portion size wash and dry feet carefully every day - identify and remove indoor air pollutants - listen for public air quality announcements every day - follow rescue plan if symptoms flare-up - keep follow-up appointments:  - get at least 7 to 8 hours of sleep at night - do exercises in a comfortable position that makes breathing as easy as possible - check blood pressure 3 times per week - choose a place to take my blood pressure (home, clinic or office, retail store) - write blood pressure results in a log or diary - keep all doctor appointments - limit salt intake to 237m/day  Follow Up Plan:  Telephone follow up appointment with care management team member scheduled for:  05/08/21 The patient has been provided with contact information for the care management team and has been advised to call with any health related questions or concerns.          Plan:Telephone follow up appointment with care management team member scheduled for:  05/08/21 The patient has been provided with contact information for the care management team and has been advised to call with any health related questions or concerns.  MPeter GarterRN, BJackquline Denmark CDE Care Management Coordinator [facility] [phone] Mobile [phone]

## 2021-04-10 ENCOUNTER — Encounter: Payer: Self-pay | Admitting: Internal Medicine

## 2021-04-10 ENCOUNTER — Ambulatory Visit (INDEPENDENT_AMBULATORY_CARE_PROVIDER_SITE_OTHER): Payer: Medicare Other | Admitting: Internal Medicine

## 2021-04-10 VITALS — BP 114/60 | HR 87 | Temp 96.6°F | Ht 63.0 in | Wt 245.4 lb

## 2021-04-10 DIAGNOSIS — Z9981 Dependence on supplemental oxygen: Secondary | ICD-10-CM

## 2021-04-10 DIAGNOSIS — I671 Cerebral aneurysm, nonruptured: Secondary | ICD-10-CM | POA: Diagnosis not present

## 2021-04-10 DIAGNOSIS — E785 Hyperlipidemia, unspecified: Secondary | ICD-10-CM

## 2021-04-10 DIAGNOSIS — I700 Atherosclerosis of aorta: Secondary | ICD-10-CM | POA: Diagnosis not present

## 2021-04-10 DIAGNOSIS — I2510 Atherosclerotic heart disease of native coronary artery without angina pectoris: Secondary | ICD-10-CM | POA: Diagnosis not present

## 2021-04-10 DIAGNOSIS — I10 Essential (primary) hypertension: Secondary | ICD-10-CM | POA: Diagnosis not present

## 2021-04-10 DIAGNOSIS — Z23 Encounter for immunization: Secondary | ICD-10-CM | POA: Diagnosis not present

## 2021-04-10 DIAGNOSIS — Z0000 Encounter for general adult medical examination without abnormal findings: Secondary | ICD-10-CM | POA: Diagnosis not present

## 2021-04-10 DIAGNOSIS — E1165 Type 2 diabetes mellitus with hyperglycemia: Secondary | ICD-10-CM | POA: Diagnosis not present

## 2021-04-10 DIAGNOSIS — Z79899 Other long term (current) drug therapy: Secondary | ICD-10-CM | POA: Diagnosis not present

## 2021-04-10 LAB — BASIC METABOLIC PANEL
BUN: 24 mg/dL — ABNORMAL HIGH (ref 6–23)
CO2: 37 mEq/L — ABNORMAL HIGH (ref 19–32)
Calcium: 9.8 mg/dL (ref 8.4–10.5)
Chloride: 94 mEq/L — ABNORMAL LOW (ref 96–112)
Creatinine, Ser: 0.9 mg/dL (ref 0.40–1.20)
GFR: 62.97 mL/min (ref >60.00)
Glucose, Bld: 103 mg/dL — ABNORMAL HIGH (ref 70–99)
Potassium: 4 mEq/L (ref 3.5–5.1)
Sodium: 140 mEq/L (ref 135–145)

## 2021-04-10 LAB — LIPID PANEL
Cholesterol: 169 mg/dL (ref 0–200)
HDL: 41.7 mg/dL (ref >39.00)
LDL Cholesterol: 96 mg/dL (ref 0–99)
NonHDL: 127.73
Total CHOL/HDL Ratio: 4
Triglycerides: 158 mg/dL — ABNORMAL HIGH (ref 0.0–149.0)
VLDL: 31.6 mg/dL (ref 0.0–40.0)

## 2021-04-10 LAB — HEMOGLOBIN A1C: Hgb A1c MFr Bld: 7.5 % — ABNORMAL HIGH (ref 4.6–6.5)

## 2021-04-10 NOTE — Patient Instructions (Signed)
Good to see you today  Let try to get thought the year on samples so far  jardiance  or farxiga. . Will notify you  of labs when available.   Take extra  fluid pill  ( 2 x per day)   for 3-5 days to get the fluid  off and then go from there.  Weight daily   Get appt with Dr [name] pulmonary .   Shingles vaccine when convenient .   Avoid  sugar drinks .

## 2021-04-10 NOTE — Progress Notes (Signed)
Chief Complaint  Patient presents with   Annual Exam   Medication Management   Diabetes   Follow-up    HPI: Patient  Rebecca Mcintyre  75 y.o. comes in today for Preventive Health Care visit med check here with her daughter today.  Independent living.  Get vision check near future hearing about the same but check right ear  had a tube didn't last   Breathing alert bit more struggling recently is on home O2 COPD with underlying CHF.  Takes Trelegy Ellipta inhaler which has been helpful but very expensive.  Due for follow-up with Dr. [name].  Diabetes had done well on Farxiga 10 mg but she is now in the donut hole and not eligible for program so the medicine would cost over $120 a month has been using samples in the short run question by the pharmacist if Ozempic Rybelsus would be an alternative.She is using occasional sugar drinks feels that she is going to work on the weight again after the holidays.  Right lower extremity injury is pretty much healed there is lots of skin changes  Sees cardiology about every 6 months next follow-up with Dr. Percival Spanish is in the spring. Weight daily  some fluid collection in legs   reluctant o inc lasix cause of  bathroom needs   Health Maintenance  Topic Date Due   Zoster Vaccines- Shingrix (1 of 2) Never done   TETANUS/TDAP  09/13/2017   COVID-19 Vaccine (4 - Booster for Pfizer series) 06/08/2020   URINE MICROALBUMIN  03/27/2021   FOOT EXAM  04/03/2021   HEMOGLOBIN A1C  04/05/2021   OPHTHALMOLOGY EXAM  05/08/2021   COLONOSCOPY (Pts 45-41yr Insurance coverage will need to be confirmed)  12/25/2021   MAMMOGRAM  03/08/2022   Pneumonia Vaccine 75 Years old  Completed   INFLUENZA VACCINE  Completed   DEXA SCAN  Completed   Hepatitis C Screening  Completed   HPV VACCINES  Aged Out   Health Maintenance Review LIFESTYLE:  Exercise:  limited  Tobacco/ETS: no Alcohol:  ocass Sugar beverages:  water mostly  oj and lemonade . Ocass    Sleep: 8  hours   with napst  Drug use: no HH of  alone  Eye check next week.  Hx of covid   Resp strained from  weather  for weeks    ROS:  See hpi  REST of 12 system review negative except as per HPI   Past Medical History:  Diagnosis Date   Acute on chronic respiratory failure with hypoxia and hypercapnia ([facility] 11/05/2018   Allergy    ASCUS on Pap smear    had repeat x 2 normal with Dr. RMarvel Plan2011   Coronary artery disease    Gout    Hyperlipidemia    Hypertension    Myocardial infarction ([facility]    2002 95% proximal LAD stenosis with thrombus followed by 90% stenosis, the first diagonal 80% stenosis, circumflex 30% stenosis, circumflex obtuse marginal subbranch 70% stenosis, PDA 40% stenosis. She had a drug-eluting stent placement with a Pixel stent   Osteopenia    Solitary kidney     Past Surgical History:  Procedure Laterality Date   CARDIAC CATHETERIZATION     CORONARY ANGIOPLASTY     CORONARY ANGIOPLASTY WITH STENT PLACEMENT  2002   HEMORRHOID SURGERY  2011   LAD Stent angioplasty  2002   Right oophorectomy with salpingectomy     benign growth Dr. RJoneen Caraway   Family History  Problem Relation Age of Onset   Breast cancer Mother 60   Lung cancer Father    Heart attack Brother        Died age 82 MI   Colon cancer Brother    Heart disease Daughter 59       smoker and overweight    Esophageal cancer Neg Hx    Pancreatic cancer Neg Hx    Rectal cancer Neg Hx    Stomach cancer Neg Hx     Social History   Socioeconomic History   Marital status: Single    Spouse name: Not on file   Number of children: 2   Years of education: Not on file   Highest education level: Not on file  Occupational History   Occupation: semi retired    Fish farm manager: Clinical research associate  Tobacco Use   Smoking status: Former    Packs/day: 1.00    Years: 39.00    Pack years: 39.00    Types: Cigarettes    Quit date: 11/04/2018    Years since quitting: 2.4   Smokeless tobacco: Never  Vaping Use    Vaping Use: Never used  Substance and Sexual Activity   Alcohol use: Yes    Comment: 1-2 per month   Drug use: No   Sexual activity: Not on file  Other Topics Concern   Not on file  Social History Narrative   Lives alone   Left Handed   Drinks 1-2 cups caffeine daily   Social Determinants of Health   Financial Resource Strain: Low Risk    Difficulty of Paying Living Expenses: Not very hard  Food Insecurity: No Food Insecurity   Worried About Charity fundraiser in the Last Year: Never true   Ran Out of Food in the Last Year: Never true  Transportation Needs: No Transportation Needs   Lack of Transportation (Medical): No   Lack of Transportation (Non-Medical): No  Physical Activity: Insufficiently Active   Days of Exercise per Week: 5 days   Minutes of Exercise per Session: 20 min  Stress: No Stress Concern Present   Feeling of Stress : Not at all  Social Connections: Moderately Isolated   Frequency of Communication with Friends and Family: Twice a week   Frequency of Social Gatherings with Friends and Family: Twice a week   Attends Religious Services: More than 4 times per year   Active Member of [employer] or Organizations: No   Attends Archivist Meetings: Never   Marital Status: Divorced    Outpatient Medications Prior to Visit  Medication Sig Dispense Refill   Accu-Chek Softclix Lancets lancets Use as instructed twice a day Diagnosis E11.9 100 each 3   acetaminophen (TYLENOL) 500 MG tablet Take 1,000 mg by mouth every 6 (six) hours as needed for mild pain.     albuterol (PROVENTIL) (2.5 MG/3ML) 0.083% nebulizer solution Take 3 mLs (2.5 mg total) by nebulization every 6 (six) hours as needed for wheezing or shortness of breath. 300 mL 11   allopurinol (ZYLOPRIM) 100 MG tablet TAKE 1 TABLET BY MOUTH TWICE A DAY 180 tablet 1   aspirin 81 MG tablet Take 81 mg by mouth daily.     Blood Glucose Monitoring Suppl (ACCU-CHEK GUIDE) w/Device KIT Use as directed twice a day  Diagnosis E11.9 1 kit 2   dapagliflozin propanediol (FARXIGA) 10 MG TABS tablet Take 1 tablet (10 mg total) by mouth daily before breakfast. 28 tablet 0   dapagliflozin propanediol (FARXIGA) 10 MG  TABS tablet Take 1 tablet (10 mg total) by mouth daily before breakfast. 28 tablet 0   furosemide (LASIX) 40 MG tablet TAKE 1 TABLET BY MOUTH EVERY DAY 90 tablet 1   glucose blood (ACCU-CHEK GUIDE) test strip Use as instructed twice a day Diagnosis E11.9 100 each 3   ketoconazole (NIZORAL) 2 % cream Apply 1 application topically 2 (two) times daily. 30 g 0   KLOR-CON M10 10 MEQ tablet TAKE 1 TABLET BY MOUTH 2 TIMES DAILY. 180 tablet 1   metFORMIN (GLUCOPHAGE) 500 MG tablet TAKE 1 TABLET BY MOUTH TWICE A DAY WITH A MEAL INCREASE TO 2 TABLETS TWICE A DAY WITH A MEAL 360 tablet 0   metoprolol tartrate (LOPRESSOR) 25 MG tablet TAKE 1/2 TABLETS BY MOUTH 2 TIMES DAILY 90 tablet 1   montelukast (SINGULAIR) 10 MG tablet TAKE 1 TABLET (10 MG TOTAL) AT BEDTIME BY MOUTH. 90 tablet 0   nitroGLYCERIN (NITROSTAT) 0.4 MG SL tablet PLACE 1 TABLET UNDER THE TONGUE EVERY 5 MINUTES AS NEEDED. 25 tablet 1   predniSONE (DELTASONE) 10 MG tablet Take 4 tabs by mouth once daily x4 days, then 3 tabs x4 days, 2 tabs x4 days, 1 tab x4 days and stop. 40 tablet 0   PROAIR HFA 108 (90 Base) MCG/ACT inhaler TAKE 2 PUFFS BY MOUTH EVERY 6 HOURS AS NEEDED FOR WHEEZE OR SHORTNESS OF BREATH 8.5 each 3   rOPINIRole (REQUIP) 0.5 MG tablet 1 TAB AT BEDTIME FOR RESTLESS LEG 90 tablet 0   simvastatin (ZOCOR) 40 MG tablet Take 1 tablet (40 mg total) by mouth at bedtime. 90 tablet 3   TRELEGY ELLIPTA 100-62.5-25 MCG/ACT AEPB TAKE 1 PUFF BY MOUTH EVERY DAY 60 each 3   No facility-administered medications prior to visit.     EXAM:  BP 114/60 (BP Location: Left Arm, Patient Position: Sitting, Cuff Size: Large)   Pulse 87   Temp (!) 96.6 F (35.9 C) (Temporal)   Ht _0  (1.6 m)   Wt 245 lb 6.4 oz (111.3 kg)   SpO2 92%   BMI 43.47 kg/m    Body mass index is 43.47 kg/m. Wt Readings from Last 3 Encounters:  04/10/21 245 lb 6.4 oz (111.3 kg)  02/20/21 243 lb 8 oz (110.5 kg)  01/16/21 243 lb 8 oz (110.5 kg)    Physical Exam: Vital signs reviewed ZOX:WRUE is a well-developed well-nourished alert cooperative    who appearsr stated age in no acute distress.  On O2 some dec hearing with mask  use  resp effort when laying down for exam  needs Wc to get to  lab  HEENT: normocephalic atraumatic , Eyes: PERRL EOM's full, conjunctiva clear, Nares: paten,t no deformity discharge or tenderness., Ears: no deformity EAC's clear TMs r some ? Fluid bony lm nl lgith reflex ok  no FB  Mouth: masked  NECK: supple without masses, thyromegaly or bruits. CHEST/PULM:  Clear to auscultation and percussion breath sounds  decreased  equal no wheeze , rales or rhonchi. Breast: normal by inspection . No dimpling, discharge, masses, tenderness or discharge . CV: PMI is ? nondisplaced, S1 S2 no gallops, murmurs, rubs. Peripheral pulses are present .[name]  ABDOMEN: Bowel sounds normal nontender Large   No guard or rebound, no obvious masses  no CVA tenderness. No hernia. Extremtities:  No clubbing cyanosis o2+ edema feet   no acute joint swelling or redness no focal atrophy NEURO:  Oriented x3, cranial nerves 3-12 appear to be intact, no  obvious focal weakness,gait within normal limits no abnormal reflexes or asymmetrical SKIN: No acute rashes normal turgor, color, no bruising or petechiae.healed scar right foot leg  dryness no ulcer  PSYCH: Oriented, good eye contact, no obvious depression anxiety, cognition and judgment appear normal. LN: no cervical axillary adenopathy  Lab Results  Component Value Date   WBC 8.5 11/28/2020   HGB 13.6 11/28/2020   HCT 40.0 11/28/2020   PLT 248 11/28/2020   GLUCOSE 123 (H) 12/25/2020   CHOL 155 03/27/2020   TRIG 148.0 03/27/2020   HDL 36.50 (L) 03/27/2020   LDLDIRECT 116.0 02/05/2019   LDLCALC 89 03/27/2020   ALT  25 11/28/2020   AST 28 11/28/2020   NA 141 12/25/2020   K 3.8 12/25/2020   CL 99 12/25/2020   CREATININE 0.96 12/25/2020   BUN 24 (H) 12/25/2020   CO2 31 12/25/2020   TSH 3.354 11/29/2020   INR 1.0 11/28/2020   HGBA1C 7.4 (H) 10/03/2020   MICROALBUR 2.8 (H) 03/27/2020    BP Readings from Last 3 Encounters:  04/10/21 114/60  01/16/21 126/73  01/01/21 100/60    Lab plan reviewed with patient   ASSESSMENT AND PLAN:  Discussed the following assessment and plan:    ICD-10-CM   1. Visit for preventive health examination  Z00.00     2. Type 2 diabetes mellitus with hyperglycemia, without long-term current use of insulin (HCC)  O35.00 Basic metabolic panel    Hemoglobin A1c    Lipid panel    Basic metabolic panel    Hemoglobin A1c    Lipid panel    3. Essential hypertension  X38 Basic metabolic panel    Hemoglobin A1c    Lipid panel    Basic metabolic panel    Hemoglobin A1c    Lipid panel    4. Medication management  H82.993 Basic metabolic panel    Hemoglobin A1c    Lipid panel    Basic metabolic panel    Hemoglobin A1c    Lipid panel    5. Need for immunization against influenza  Z23 Flu Vaccine QUAD High Dose(Fluad)    6. Oxygen dependent  Z99.81     7. Atherosclerosis of aorta (HCC)  I70.0    hx cad Mi on statin therapy    8. Coronary artery disease involving native heart without angina pectoris, unspecified vessel or lesion type  I25.10     9. Hyperlipidemia, unspecified hyperlipidemia type  E78.5     10. Anterior communicating artery aneurysm  I67.1    to follow  every 6-12 mos     11. Severe obesity (BMI >= 40) (HCC)  E66.01     Advise intensification of diuretic over the next 3 to 5 days can take twice daily for diuresis.  We can add more if needed she is reluctant because of the inconvenience difficulty with increased urination. Would prefer she stay on the Farxiga (or even if we had samples of Jardiance to get her through the donut hole )as  opposed to switching over to a GLP-1 such as Ozempic or Rybelsus. She states her sugars have risen after diagnosis of COVID but this could be the inactivity and dietary changes as opposed to the disease itself. Reviewed limiting peer carbohydrates especially in beverages if we have to add medication we can use GLP meds  Cost of medications are indeed a consideration. Uncertain of her change in respiratory status make appointment with Dr. [name] ccontinue Trelegy and do diuresis as  possible. Flu shot today Repeat lipids bmp a1c today and go from there and fu  in  4 mos or so  Return for depending on results 4 mos or so .  Patient Care Team: Linden Mikes, Standley Brooking, MD as PCP - General Minus Breeding, MD as PCP - Cardiology (Cardiology) Paula Compton, MD as Attending Physician (Obstetrics and Gynecology) Irene Shipper, MD as Attending Physician (Gastroenterology) Sharyne Peach, MD as Consulting Physician (Ophthalmology) Minus Breeding, MD as Consulting Physician (Cardiology) Rigoberto Noel, MD as Consulting Physician (Pulmonary Disease) Izora Gala, MD as Consulting Physician (Otolaryngology) Viona Gilmore, [facility] as Pharmacist (Pharmacist) Dimitri Ped, RN as Case Manager Patient Instructions  Good to see you today  Let try to get thought the year on samples so far  jardiance  or farxiga. . Will notify you  of labs when available.   Take extra  fluid pill  ( 2 x per day)   for 3-5 days to get the fluid  off and then go from there.  Weight daily   Get appt with Dr [name] pulmonary .   Shingles vaccine when convenient .   Avoid  sugar drinks .    Standley Brooking. Jonerik Sliker M.D.

## 2021-04-12 ENCOUNTER — Other Ambulatory Visit: Payer: Self-pay | Admitting: *Deleted

## 2021-04-12 MED ORDER — DAPAGLIFLOZIN PROPANEDIOL 10 MG PO TABS
10.0000 mg | ORAL_TABLET | Freq: Every day | ORAL | 0 refills | Status: DC
Start: 2021-04-12 — End: 2021-07-05

## 2021-04-13 ENCOUNTER — Encounter: Payer: Self-pay | Admitting: Internal Medicine

## 2021-04-13 ENCOUNTER — Telehealth: Payer: Self-pay | Admitting: Pharmacist

## 2021-04-13 DIAGNOSIS — J449 Chronic obstructive pulmonary disease, unspecified: Secondary | ICD-10-CM | POA: Diagnosis not present

## 2021-04-13 DIAGNOSIS — I5033 Acute on chronic diastolic (congestive) heart failure: Secondary | ICD-10-CM | POA: Diagnosis not present

## 2021-04-13 NOTE — Progress Notes (Signed)
Chronic Care Management Pharmacy Assistant   Name: Rebecca Mcintyre  MRN: [id number] DOB: 09-Aug-1945  Reason for Encounter: Patient assistance for Farxiga. Patient states she is not comfortable giving out her income information to anyone including the patient assistance program. She states she spoke with [name] about this and they came to an agreement about this issue.  Patient understands what we are trying to do to help her.  Patient is going to pick up the samples at the office, a months supply will take her to the end of this year. Come January she will no longer be in the donut hole, she states if she goes back in the donut hole next year she has already discussed with [name] that they will change her medications around to be affordable without having to give her personal income information to anyone.  Care Gaps:   AWV - scheduled on 03/28/21, to be rescheduled. Zoster vaccine - never done Tetanus/TDAP - overdue since 2019 Covid-19 vaccine booster 4 - overdue since 2/22 Flu vaccine - due  Last A1C - 7.4 on 10/03/20   Star Rating Drugs:   Metformin 532m - last filled on 02/21/21 90DS at [facility] Simvastatin 44m- last filled on 12/29/20 [facility] Dapagliflozin 1085m [phone]

## 2021-04-13 NOTE — Progress Notes (Signed)
Blood results stable  a1c is 7.5  Plan rov in about 4 months

## 2021-04-13 NOTE — Telephone Encounter (Signed)
Noted

## 2021-04-15 ENCOUNTER — Other Ambulatory Visit: Payer: Self-pay | Admitting: Internal Medicine

## 2021-04-25 DIAGNOSIS — J449 Chronic obstructive pulmonary disease, unspecified: Secondary | ICD-10-CM

## 2021-04-25 DIAGNOSIS — I10 Essential (primary) hypertension: Secondary | ICD-10-CM

## 2021-04-25 DIAGNOSIS — E1165 Type 2 diabetes mellitus with hyperglycemia: Secondary | ICD-10-CM | POA: Diagnosis not present

## 2021-04-25 DIAGNOSIS — I509 Heart failure, unspecified: Secondary | ICD-10-CM | POA: Diagnosis not present

## 2021-04-26 ENCOUNTER — Other Ambulatory Visit: Payer: Self-pay | Admitting: *Deleted

## 2021-04-26 MED ORDER — DAPAGLIFLOZIN PROPANEDIOL 10 MG PO TABS: 10.0000 mg | ORAL_TABLET | Freq: Every day | ORAL | 0 refills | Status: DC

## 2021-05-03 ENCOUNTER — Other Ambulatory Visit: Payer: Self-pay | Admitting: Pulmonary Disease

## 2021-05-08 ENCOUNTER — Ambulatory Visit: Payer: Medicare Other

## 2021-05-08 DIAGNOSIS — Z9981 Dependence on supplemental oxygen: Secondary | ICD-10-CM

## 2021-05-08 DIAGNOSIS — E1165 Type 2 diabetes mellitus with hyperglycemia: Secondary | ICD-10-CM

## 2021-05-08 DIAGNOSIS — I10 Essential (primary) hypertension: Secondary | ICD-10-CM

## 2021-05-08 DIAGNOSIS — I5032 Chronic diastolic (congestive) heart failure: Secondary | ICD-10-CM

## 2021-05-08 DIAGNOSIS — J449 Chronic obstructive pulmonary disease, unspecified: Secondary | ICD-10-CM

## 2021-05-08 NOTE — Chronic Care Management (AMB) (Signed)
Chronic Care Management   CCM RN Visit Note  05/08/2021 Name: Rebecca Mcintyre MRN: [id number] DOB: 1946-05-08  Subjective: Rebecca Mcintyre is a 75 y.o. year old female who is a primary care patient of Panosh, Standley Brooking, MD. The care management team was consulted for assistance with disease management and care coordination needs.    Engaged with patient by telephone for follow up visit in response to provider referral for case management and/or care coordination services.   Consent to Services:  The patient was given information about Chronic Care Management services, agreed to services, and gave verbal consent prior to initiation of services.  Please see initial visit note for detailed documentation.   Patient agreed to services and verbal consent obtained.   Assessment: Review of patient past medical history, allergies, medications, health status, including review of consultants reports, laboratory and other test data, was performed as part of comprehensive evaluation and provision of chronic care management services.   SDOH (Social Determinants of Health) assessments and interventions performed:    CCM Care Plan  Allergies  Allergen Reactions   Atorvastatin     REACTION: myalgias  and liver    Outpatient Encounter Medications as of 05/08/2021  Medication Sig   Accu-Chek Softclix Lancets lancets Use as instructed twice a day Diagnosis E11.9   acetaminophen (TYLENOL) 500 MG tablet Take 1,000 mg by mouth every 6 (six) hours as needed for mild pain.   albuterol (PROVENTIL) (2.5 MG/3ML) 0.083% nebulizer solution Take 3 mLs (2.5 mg total) by nebulization every 6 (six) hours as needed for wheezing or shortness of breath.   allopurinol (ZYLOPRIM) 100 MG tablet TAKE 1 TABLET BY MOUTH TWICE A DAY   aspirin 81 MG tablet Take 81 mg by mouth daily.   Blood Glucose Monitoring Suppl (ACCU-CHEK GUIDE) w/Device KIT Use as directed twice a day Diagnosis E11.9   dapagliflozin propanediol (FARXIGA) 10  MG TABS tablet Take 1 tablet (10 mg total) by mouth daily before breakfast.   dapagliflozin propanediol (FARXIGA) 10 MG TABS tablet Take 1 tablet (10 mg total) by mouth daily before breakfast.   furosemide (LASIX) 40 MG tablet TAKE 1 TABLET BY MOUTH EVERY DAY   glucose blood (ACCU-CHEK GUIDE) test strip Use as instructed twice a day Diagnosis E11.9   ketoconazole (NIZORAL) 2 % cream Apply 1 application topically 2 (two) times daily.   KLOR-CON M10 10 MEQ tablet TAKE 1 TABLET BY MOUTH 2 TIMES DAILY.   metFORMIN (GLUCOPHAGE) 500 MG tablet TAKE 1 TABLET BY MOUTH TWICE A DAY WITH A MEAL INCREASE TO 2 TABLETS TWICE A DAY WITH A MEAL   metoprolol tartrate (LOPRESSOR) 25 MG tablet TAKE 1/2 TABLETS BY MOUTH 2 TIMES DAILY   montelukast (SINGULAIR) 10 MG tablet TAKE 1 TABLET (10 MG TOTAL) AT BEDTIME BY MOUTH.   nitroGLYCERIN (NITROSTAT) 0.4 MG SL tablet PLACE 1 TABLET UNDER THE TONGUE EVERY 5 MINUTES AS NEEDED.   predniSONE (DELTASONE) 10 MG tablet Take 4 tabs by mouth once daily x4 days, then 3 tabs x4 days, 2 tabs x4 days, 1 tab x4 days and stop.   PROAIR HFA 108 (90 Base) MCG/ACT inhaler TAKE 2 PUFFS BY MOUTH EVERY 6 HOURS AS NEEDED FOR WHEEZE OR SHORTNESS OF BREATH   rOPINIRole (REQUIP) 0.5 MG tablet 1 TAB AT BEDTIME FOR RESTLESS LEG   simvastatin (ZOCOR) 40 MG tablet Take 1 tablet (40 mg total) by mouth at bedtime.   TRELEGY ELLIPTA 100-62.5-25 MCG/ACT AEPB TAKE 1 PUFF BY MOUTH  EVERY DAY   No facility-administered encounter medications on file as of 05/08/2021.    Patient Active Problem List   Diagnosis Date Noted   Elevated troponin 11/28/2020   Anterior communicating artery aneurysm 11/28/2020   Thyroid nodule 11/28/2020   Contusion of right foot    Educated about COVID-19 virus infection 08/04/2019   COPD (chronic obstructive pulmonary disease) ([place]) 12/29/2018   Restless leg syndrome 12/22/2018   Congestive heart failure ([place]) 12/13/2018   Acute on chronic diastolic CHF (congestive heart  failure) ([place]) 11/05/2018   Acute CHF (congestive heart failure) ([place]) 11/04/2018   CHF (congestive heart failure) ([place]) 11/04/2018   Diabetes mellitus type II, non insulin dependent ([place]) 11/04/2018   Great toe pain, left 01/01/2018   Myocardial infarction [facility])    Elevated hemoglobin ([place]) 04/24/2014   Visit for preventive health examination 04/24/2014   Pre-diabetes 08/12/2012   Decreased hearing 04/12/2012   Welcome to Medicare preventive visit 04/08/2012   HYPERURICEMIA 03/15/2009   GOUT 12/13/2008   OBESITY 08/23/2008   OSTEOPENIA 09/01/2007   MYOCARDIAL INFARCTION, HX OF 07/14/2007   SOLITARY KIDNEY 07/14/2007   Hyperlipidemia 04/15/2007   Essential hypertension 04/15/2007   CAD (coronary artery disease) 04/15/2007   OTHER ABNORMAL GLUCOSE 04/15/2007   TOBACCO ABUSE, HX OF 04/15/2007    Conditions to be addressed/monitored:CHF, HTN, COPD, and DMII  Care Plan : RN Care Manager Plan of Care  Updates made by Dimitri Ped, RN since 05/08/2021 12:00 AM     Problem: Chronic Disease Management and Care Coordination Needs (COPD,DM2,CHF,HTN)   Priority: High     Long-Range Goal: Establish Plan of Care for Chronic Disease Management Needs (COPD,DM2,CHF,HTN)   Start Date: 04/06/2021  Expected End Date: 10/03/2021  Recent Progress: On track  Priority: High  Note:   Current Barriers:  Care Coordination needs related to Financial constraints related to medication in coverage gap Chronic Disease Management support and education needs related to CHF, HTN, COPD, and DMII Financial Constraints  States she gets short of breath when she walks.  States she wears her O2 at all times.  Denies any chest pains or swelling.  States she weights daily and her weight stays around 240 most of the time.  States she will take a second Lasix if her weight goes up or if she has swelling in her legs as she has been directed to to do.  States she checks her CBG twice a day with fasting readings  of 97-125 and at bedtime 140-152.  States she has not been drinking anything with sugar in it and she will work on losing weight after the holidays.  States she has enough samples of Farxiga to last her until the new year.   RNCM Clinical Goal(s):  Patient will verbalize basic understanding of  CHF, HTN, COPD, and DMII disease process and self health management plan as evidenced by voiced adherence to plan of care take all medications exactly as prescribed and will call provider for medication related questions as evidenced by dispense report and pt verbalization attend all scheduled medical appointments: CCM PharmD 05/23/21, Neurology 07/16/21 as evidenced by CCM PharmD 05/23/21, Neurology 07/16/21 demonstrate Improved adherence to prescribed treatment plan for CHF, HTN, COPD, and DMII as evidenced by readings within limits, adherence to plan of care work with pharmacist to address Financial constraints related to [place] related toCHF, HTN, COPD, and DMII as evidenced by review or EMR and patient or pharmacist report through collaboration with Consulting civil engineer, provider, and  care team.   Interventions: 1:1 collaboration with primary care provider regarding development and update of comprehensive plan of care as evidenced by provider attestation and co-signature Inter-disciplinary care team collaboration (see longitudinal plan of care) Evaluation of current treatment plan related to  self management and patient's adherence to plan as established by provider  Hypertension Interventions:Goal on track:  Yes.Long Term Goal Last practice recorded BP readings:  BP Readings from Last 3 Encounters:  01/16/21 126/73  01/01/21 100/60  12/25/20 116/70  Most recent eGFR/CrCl: No results found for: EGFR  No components found for: CRCL  Provided education to patient re: stroke prevention, s/s of heart attack and stroke Reviewed medications with patient and discussed importance of compliance Discussed plans  with patient for ongoing care management follow up and provided patient with direct contact information for care management team Advised patient, providing education and rationale, to monitor blood pressure daily and record, calling PCP for findings outside established parameters Reviewed to follow a low sodium diet and to avoid salted chips  Diabetes Interventions:Goal on track:  Yes.Long Term Goal Assessed patient's understanding of A1c goal: <7% Provided education to patient about basic DM disease process Reviewed medications with patient and discussed importance of medication adherence Counseled on importance of regular laboratory monitoring as prescribed Discussed plans with patient for ongoing care management follow up and provided patient with direct contact information for care management team Advised patient, providing education and rationale, to check cbg twice a day and record, calling provider for findings outside established parameters Review of patient status, including review of consultants reports, relevant laboratory and other test results, and medications completed Reviewed fasting blood sugar goals of 80-130 and less than 180 1 1/2-2 hours after meals. Reviewed signs and symptoms of hypoglycemia and actions to take  Lab Results  Component Value Date   HGBA1C 7.4 (H) 10/03/2020   COPD Interventions:  Goal on track:  Yes.Long Term Goal New goal. Provided patient with basic written and verbal COPD education on self care/management/and exacerbation prevention Provided instruction about proper use of medications used for management of COPD including inhalers Advised patient to self assesses COPD action plan zone and make appointment with provider if in the yellow zone for 48 hours without improvement Advised patient to engage in light exercise as tolerated 3-5 days a week to aid in the the management of COPD Discussed the importance of adequate rest and management of fatigue with  COPD Reviewed to call pulmonology to schedule follow up appointment in January  Heart Failure Interventions:Goal on track:  Yes.Long Term Goal New goal. Provided education on low sodium diet Discussed importance of daily weight and advised patient to weigh and record daily Reviewed role of diuretics in prevention of fluid overload and management of heart failure; Discussed the importance of keeping all appointments with provider Provided patient with education about the role of exercise in the management of heart failure Reinforced heart failure zones and when to take extra Lasix and when to contact her doctor  Patient Goals/Self-Care Activities: Take all medications as prescribed Attend all scheduled provider appointments Call pharmacy for medication refills 3-7 days in advance of running out of medications Perform all self care activities independently  Call provider office for new concerns or questions  call office if I gain more than 2 pounds in one day or 5 pounds in one week keep legs up while sitting watch for swelling in feet, ankles and legs every day weigh myself daily follow rescue plan if  symptoms flare-up eat more whole grains, fruits and vegetables, lean meats and healthy fats keep appointment with eye doctor check blood sugar at prescribed times: twice daily and when you have symptoms of low or high blood sugar check feet daily for cuts, sores or redness drink 6 to 8 glasses of water each day fill half of plate with vegetables manage portion size switch to sugar-free drinks identify and remove indoor air pollutants do breathing exercises every day follow rescue plan if symptoms flare-up get at least 7 to 8 hours of sleep at night check blood pressure daily take blood pressure log to all doctor appointments eat more whole grains, fruits and vegetables, lean meats and healthy fats limit salt intake to 2382m/day  Follow Up Plan:  Telephone follow up appointment  with care management team member scheduled for:  07/17/21 The patient has been provided with contact information for the care management team and has been advised to call with any health related questions or concerns.          Plan:Telephone follow up appointment with care management team member scheduled for:  07/17/21 The patient has been provided with contact information for the care management team and has been advised to call with any health related questions or concerns.  MPeter GarterRN, BJackquline Denmark CDE Care Management Coordinator [facility] ([phone] Mobile ([phone]

## 2021-05-08 NOTE — Patient Instructions (Signed)
Visit Information  Thank you for taking time to visit with me today. Please don't hesitate to contact me if I can be of assistance to you before our next scheduled telephone appointment.  Following are the goals we discussed today:  Take all medications as prescribed Attend all scheduled provider appointments Call pharmacy for medication refills 3-7 days in advance of running out of medications Perform all self care activities independently  Call provider office for new concerns or questions  call office if I gain more than 2 pounds in one day or 5 pounds in one week keep legs up while sitting watch for swelling in feet, ankles and legs every day weigh myself daily follow rescue plan if symptoms flare-up eat more whole grains, fruits and vegetables, lean meats and healthy fats keep appointment with eye doctor check blood sugar at prescribed times: twice daily and when you have symptoms of low or high blood sugar check feet daily for cuts, sores or redness drink 6 to 8 glasses of water each day fill half of plate with vegetables manage portion size switch to sugar-free drinks identify and remove indoor air pollutants do breathing exercises every day follow rescue plan if symptoms flare-up get at least 7 to 8 hours of sleep at night check blood pressure daily take blood pressure log to all doctor appointments eat more whole grains, fruits and vegetables, lean meats and healthy fats limit salt intake to 2361m/day  Our next appointment is by telephone on 07/17/21 at 10 AM  Please call the care guide team at [phone] you need to cancel or reschedule your appointment.   If you are experiencing a Mental Health or BLorainor need someone to talk to, please call the Suicide and Crisis Lifeline: 988 call 1-800-273-TALK (toll free, 24 hour hotline) go to [facility] [address] [place]([phone] call 911   Patient  verbalizes understanding of instructions provided today and agrees to view in MPink  MPeter GarterRN, BJackquline Denmark CDE Care Management Coordinator [facility] ([phone] Mobile ([phone]

## 2021-05-10 ENCOUNTER — Encounter: Payer: Self-pay | Admitting: Internal Medicine

## 2021-05-10 DIAGNOSIS — E119 Type 2 diabetes mellitus without complications: Secondary | ICD-10-CM | POA: Diagnosis not present

## 2021-05-10 DIAGNOSIS — H52202 Unspecified astigmatism, left eye: Secondary | ICD-10-CM | POA: Diagnosis not present

## 2021-05-10 DIAGNOSIS — H40023 Open angle with borderline findings, high risk, bilateral: Secondary | ICD-10-CM | POA: Diagnosis not present

## 2021-05-10 LAB — HM DIABETES EYE EXAM

## 2021-05-13 DIAGNOSIS — I5033 Acute on chronic diastolic (congestive) heart failure: Secondary | ICD-10-CM | POA: Diagnosis not present

## 2021-05-13 DIAGNOSIS — J449 Chronic obstructive pulmonary disease, unspecified: Secondary | ICD-10-CM | POA: Diagnosis not present

## 2021-05-22 ENCOUNTER — Telehealth: Payer: Medicare Other | Admitting: Nurse Practitioner

## 2021-05-22 ENCOUNTER — Ambulatory Visit (INDEPENDENT_AMBULATORY_CARE_PROVIDER_SITE_OTHER): Payer: Medicare Other

## 2021-05-22 ENCOUNTER — Other Ambulatory Visit: Payer: Self-pay

## 2021-05-22 ENCOUNTER — Ambulatory Visit (INDEPENDENT_AMBULATORY_CARE_PROVIDER_SITE_OTHER): Payer: Medicare Other | Admitting: Internal Medicine

## 2021-05-22 ENCOUNTER — Encounter: Payer: Self-pay | Admitting: Internal Medicine

## 2021-05-22 ENCOUNTER — Telehealth: Payer: Self-pay | Admitting: Pharmacist

## 2021-05-22 VITALS — BP 108/50 | HR 88 | Temp 98.5°F | Ht 63.0 in | Wt 246.8 lb

## 2021-05-22 DIAGNOSIS — L539 Erythematous condition, unspecified: Secondary | ICD-10-CM

## 2021-05-22 DIAGNOSIS — I5032 Chronic diastolic (congestive) heart failure: Secondary | ICD-10-CM

## 2021-05-22 DIAGNOSIS — R0600 Dyspnea, unspecified: Secondary | ICD-10-CM

## 2021-05-22 DIAGNOSIS — L989 Disorder of the skin and subcutaneous tissue, unspecified: Secondary | ICD-10-CM

## 2021-05-22 DIAGNOSIS — Z9981 Dependence on supplemental oxygen: Secondary | ICD-10-CM

## 2021-05-22 DIAGNOSIS — J449 Chronic obstructive pulmonary disease, unspecified: Secondary | ICD-10-CM | POA: Diagnosis not present

## 2021-05-22 DIAGNOSIS — R21 Rash and other nonspecific skin eruption: Secondary | ICD-10-CM

## 2021-05-22 MED ORDER — DOXYCYCLINE HYCLATE 100 MG PO TABS: 100.0000 mg | ORAL_TABLET | Freq: Two times a day (BID) | ORAL | 0 refills | Status: DC

## 2021-05-22 MED ORDER — VALACYCLOVIR HCL 1 G PO TABS: 1000.0000 mg | ORAL_TABLET | Freq: Three times a day (TID) | ORAL | 0 refills | Status: DC

## 2021-05-22 NOTE — Chronic Care Management (AMB) (Addendum)
° ° °  Chronic Care Management Pharmacy Assistant   Name: TAKYRA CANTRALL  MRN: [id number] DOB: 08-24-1945  05/23/2021 APPOINTMENT REMINDER   Called [name], No answer, left message of appointment on 05/23/2021 at 10:30, notified this appointment has been changed from an in office appointment to a telephone visit with Jeni Salles, Pharm D.  Patient was also notified that [name] is aware of her appointment today with [name] and if she would like to reschedule her appointment with [name] to another date,  to return my call and we can schedule this for another day.  Notified to have all medications, supplements, blood pressure and/or blood sugar logs available during appointment.  Care Gaps: AWV - completed 03/30/2021 Tetanus/TDAP - overdue since 2019 Malb - overdue Foot exam - overdue Last A1C - 7.5 on 04/10/21 Last BP - 108/50 on 05/22/2021  Star Rating Drug: Metformin 536m - last filled on 02/21/21 [facility]DS at [facility] Simvastatin 456m- last filled on 03/29/2021 [facility] at [facility] Dapagliflozin 1052m last filled on 01/24/21 30DS at [facility] verified with [facility] Any gaps in medications fill history? Yes   JanJoffrearmacist Assistant [phone]

## 2021-05-22 NOTE — Patient Instructions (Addendum)
Treating your arm redness and soreness  as a skin bacterial infection . With antibiotic  but not sure  if this is the correct diagnosis .      Not typical of shingles   but if blistering  and more burning sensation we can add    anti viral medication  we would use for shingles .    Chest x ray today  no change    which is reassuring.   Let us know  how doing in  5-7 days or   if worse  Can send in pictures.

## 2021-05-22 NOTE — Progress Notes (Signed)
Chief Complaint  Patient presents with   Rash    Rask under right arm and possible COPD flare up    HPI: Rebecca Mcintyre 75 y.o. come in for SDA with daughter.  Right posterior upper arm has tender redness with some warmth for 2 days uncertain if bigger.  No fever injury heat cold fall or obvious cause. No specific neuralgia sensitivity or adenopathy.  She also is not sure if she could be getting an early respiratory flare since the weather got cold her breathing is been much better when it was not cold.  Mild drainage may be.  No increase in pedal edema.  Pulse ox runs 88,89.  He is on O2.  Her next appointment with Dr. [name] is in February.  She is on Trelegy.  Does have albuterol with seems to work better than controller inhaler as reported.  ROS: See pertinent positives and negatives per HPI. States blood pressure is low toda Past Medical History:  Diagnosis Date   Acute on chronic respiratory failure with hypoxia and hypercapnia (HCC) 11/05/2018   Allergy    ASCUS on Pap smear    had repeat x 2 normal with [name] Plan 2011   Coronary artery disease    Gout    Hyperlipidemia    Hypertension    Myocardial infarction [facility])    2002 95% proximal LAD stenosis with thrombus followed by 90% stenosis, the first diagonal 80% stenosis, circumflex 30% stenosis, circumflex obtuse marginal subbranch 70% stenosis, PDA 40% stenosis. She had a drug-eluting stent placement with a Pixel stent   Osteopenia    Solitary kidney     Family History  Problem Relation Age of Onset   Breast cancer Mother 4   Lung cancer Father    Heart attack Brother        Died age 2 MI   Colon cancer Brother    Heart disease Daughter 63       smoker and overweight    Esophageal cancer Neg Hx    Pancreatic cancer Neg Hx    Rectal cancer Neg Hx    Stomach cancer Neg Hx     Social History   Socioeconomic History   Marital status: Single    Spouse name: Not on file   Number of children: 2   Years of  education: Not on file   Highest education level: Not on file  Occupational History   Occupation: semi retired    Fish farm manager: Clinical research associate  Tobacco Use   Smoking status: Former    Packs/day: 1.00    Years: 39.00    Pack years: 39.00    Types: Cigarettes    Quit date: 11/04/2018    Years since quitting: 2.5   Smokeless tobacco: Never  Vaping Use   Vaping Use: Never used  Substance and Sexual Activity   Alcohol use: Yes    Comment: 1-2 per month   Drug use: No   Sexual activity: Not on file  Other Topics Concern   Not on file  Social History Narrative   Lives alone   Left Handed   Drinks 1-2 cups caffeine daily   Social Determinants of Health   Financial Resource Strain: Low Risk    Difficulty of Paying Living Expenses: Not very hard  Food Insecurity: No Food Insecurity   Worried About Running Out of Food in the Last Year: Never true   Ran Out of Food in the Last Year: Never true  Transportation Needs: No  Transportation Needs   Lack of Transportation (Medical): No   Lack of Transportation (Non-Medical): No  Physical Activity: Insufficiently Active   Days of Exercise per Week: 5 days   Minutes of Exercise per Session: 20 min  Stress: No Stress Concern Present   Feeling of Stress : Not at all  Social Connections: Moderately Isolated   Frequency of Communication with Friends and Family: Twice a week   Frequency of Social Gatherings with Friends and Family: Twice a week   Attends Religious Services: More than 4 times per year   Active Member of [employer] or Organizations: No   Attends Archivist Meetings: Never   Marital Status: Divorced    Outpatient Medications Prior to Visit  Medication Sig Dispense Refill   Accu-Chek Softclix Lancets lancets Use as instructed twice a day Diagnosis E11.9 100 each 3   acetaminophen (TYLENOL) 500 MG tablet Take 1,000 mg by mouth every 6 (six) hours as needed for mild pain.     albuterol (PROVENTIL) (2.5 MG/3ML) 0.083% nebulizer  solution Take 3 mLs (2.5 mg total) by nebulization every 6 (six) hours as needed for wheezing or shortness of breath. 300 mL 11   allopurinol (ZYLOPRIM) 100 MG tablet TAKE 1 TABLET BY MOUTH TWICE A DAY 180 tablet 1   aspirin 81 MG tablet Take 81 mg by mouth daily.     Blood Glucose Monitoring Suppl (ACCU-CHEK GUIDE) w/Device KIT Use as directed twice a day Diagnosis E11.9 1 kit 2   dapagliflozin propanediol (FARXIGA) 10 MG TABS tablet Take 1 tablet (10 mg total) by mouth daily before breakfast. 14 tablet 0   dapagliflozin propanediol (FARXIGA) 10 MG TABS tablet Take 1 tablet (10 mg total) by mouth daily before breakfast. 28 tablet 0   furosemide (LASIX) 40 MG tablet TAKE 1 TABLET BY MOUTH EVERY DAY 90 tablet 1   glucose blood (ACCU-CHEK GUIDE) test strip Use as instructed twice a day Diagnosis E11.9 100 each 3   ketoconazole (NIZORAL) 2 % cream Apply 1 application topically 2 (two) times daily. 30 g 0   KLOR-CON M10 10 MEQ tablet TAKE 1 TABLET BY MOUTH 2 TIMES DAILY. 180 tablet 1   metFORMIN (GLUCOPHAGE) 500 MG tablet TAKE 1 TABLET BY MOUTH TWICE A DAY WITH A MEAL INCREASE TO 2 TABLETS TWICE A DAY WITH A MEAL 360 tablet 0   metoprolol tartrate (LOPRESSOR) 25 MG tablet TAKE 1/2 TABLETS BY MOUTH 2 TIMES DAILY 90 tablet 1   montelukast (SINGULAIR) 10 MG tablet TAKE 1 TABLET (10 MG TOTAL) AT BEDTIME BY MOUTH. 90 tablet 0   nitroGLYCERIN (NITROSTAT) 0.4 MG SL tablet PLACE 1 TABLET UNDER THE TONGUE EVERY 5 MINUTES AS NEEDED. 25 tablet 1   predniSONE (DELTASONE) 10 MG tablet Take 4 tabs by mouth once daily x4 days, then 3 tabs x4 days, 2 tabs x4 days, 1 tab x4 days and stop. 40 tablet 0   PROAIR HFA 108 (90 Base) MCG/ACT inhaler TAKE 2 PUFFS BY MOUTH EVERY 6 HOURS AS NEEDED FOR WHEEZE OR SHORTNESS OF BREATH 8.5 each 3   rOPINIRole (REQUIP) 0.5 MG tablet 1 TAB AT BEDTIME FOR RESTLESS LEG 90 tablet 0   simvastatin (ZOCOR) 40 MG tablet Take 1 tablet (40 mg total) by mouth at bedtime. 90 tablet 3   TRELEGY  ELLIPTA 100-62.5-25 MCG/ACT AEPB TAKE 1 PUFF BY MOUTH EVERY DAY 60 each 3   No facility-administered medications prior to visit.     EXAM:  BP (!) 108/50 (  BP Location: Left Arm, Patient Position: Sitting, Cuff Size: Normal)    Pulse 88    Temp 98.5 F (36.9 C) (Oral)    Ht 5' 3" (1.6 m)    Wt 246 lb 12.8 oz (111.9 kg)    SpO2 (!) 89%    BMI 43.72 kg/m   Body mass index is 43.72 kg/m.  GENERAL: vitals reviewed and listed above, alert, oriented, appears well hydrated and in no acute distress HEENT: atraumatic, conjunctiva  clear, no obvious abnormalities on inspection of external nose and ears OP : masked off and on  NECK: no obvious masses on inspection palpation  LUNGS: clear to auscultation bilaterally, no wheezes, rales or rhonchi, g decreased breath sounds throughout but equal CV: HRRR, no clubbing cyanosis +1 peripheral edema nl cap refill  MS: moves all extremities without noticeable focal  abnormality Right upper arm with blotchy red roundish along arm without adenopathy.  No vesicle there is some induration distally but no abscess.  A small dry spot on the skin but no other break in the skin her right elbow area is a dry patch with picking (according to patient she is picked that her whole life but that is not red.) On O2 ambulatory but needs help wheelchair coming back from the lab PSYCH: pleasant and cooperative, no obvious depression or anxiety cognition appears intact. Lab Results  Component Value Date   WBC 8.5 11/28/2020   HGB 13.6 11/28/2020   HCT 40.0 11/28/2020   PLT 248 11/28/2020   GLUCOSE 103 (H) 04/10/2021   CHOL 169 04/10/2021   TRIG 158.0 (H) 04/10/2021   HDL 41.70 04/10/2021   LDLDIRECT 116.0 02/05/2019   LDLCALC 96 04/10/2021   ALT 25 11/28/2020   AST 28 11/28/2020   NA 140 04/10/2021   K 4.0 04/10/2021   CL 94 (L) 04/10/2021   CREATININE 0.90 04/10/2021   BUN 24 (H) 04/10/2021   CO2 37 (H) 04/10/2021   TSH 3.354 11/29/2020   INR 1.0 11/28/2020    HGBA1C 7.5 (H) 04/10/2021   MICROALBUR 2.8 (H) 03/27/2020   BP Readings from Last 3 Encounters:  05/22/21 (!) 108/50  04/10/21 114/60  01/16/21 126/73  Chest x-ray shows no change from baseline no new opacities no effusion or increased fluid  ASSESSMENT AND PLAN:  Discussed the following assessment and plan:  Redness of skin  Skin sore  Dyspnea, unspecified type - Plan: DG Chest 2 View  Chronic diastolic congestive heart failure (HCC) - Plan: DG Chest 2 View  Chronic obstructive pulmonary disease, unspecified COPD type ([place]) - Plan: DG Chest 2 View  Oxygen dependent Typical uncertain cause of her tender painful skin rashes not typical shingles nor typical cellulitis but would treat for bacterial skin infection based on presentation and exam.  At this time would hesitate to place on steroid without more findings of a COPD exacerbation Add doxycycline 100 mg twice daily for 10 days local care prescription for Valtrex given in case begins to look more like shingles.  Daughter will take pictures send it in on MyChart follow-up 5 to 7 days depending. Watch blood pressure hydration patient states her diabetes in fairly good control.  Wilder Glade is too expensive. -Patient advised to return or notify health care team  if  new concerns arise.  Patient Instructions  Treating your arm redness and soreness  as a skin bacterial infection . With antibiotic  but not sure  if this is the correct diagnosis .  Not typical of shingles   but if blistering  and more burning sensation we can add    anti viral medication  we would use for shingles .    Chest x ray today  no change    which is reassuring.   Let us know  how doing in  5-7 days or   if worse  Can send in pictures.       Standley Brooking. Leigha Olberding M.D.
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
## 2021-05-23 ENCOUNTER — Ambulatory Visit (INDEPENDENT_AMBULATORY_CARE_PROVIDER_SITE_OTHER): Payer: Medicare Other | Admitting: Pharmacist

## 2021-05-23 DIAGNOSIS — I10 Essential (primary) hypertension: Secondary | ICD-10-CM

## 2021-05-23 DIAGNOSIS — E1165 Type 2 diabetes mellitus with hyperglycemia: Secondary | ICD-10-CM

## 2021-05-23 NOTE — Patient Instructions (Signed)
Hi Khalilah  It was great to catch up with you again! Keep up the good work with working on dietary changes to lower your A1c and I will let you know what [name] says about your cholesterol medication.  Please reach out to me if you have any questions or need anything!  Best, Maddie  Jeni Salles, PharmD, [facility]   Visit Information   Goals Addressed   None    Patient Care Plan: [facility]CM Pharmacy Care Plan     Problem Identified: Problem: Hypertension, Hyperlipidemia, Diabetes, Heart Failure, Coronary Artery Disease, COPD, Osteopenia, and Gout      Long-Range Goal: Patient-Specific Goal   Start Date: 02/07/2021  Expected End Date: 02/07/2022  Recent Progress: On track  Priority: High  Note:   Current Barriers:  Unable to independently monitor therapeutic efficacy Unable to achieve control of diabetes  Unable to maintain control of cholesterol  Pharmacist Clinical Goal(s):  Patient will achieve control of diabetes as evidenced by A1c maintain control of cholesterol as evidenced by next lipid panel  through collaboration with PharmD and provider.   Interventions: 1:1 collaboration with Panosh, Standley Brooking, MD regarding development and update of comprehensive plan of care as evidenced by provider attestation and co-signature Inter-disciplinary care team collaboration (see longitudinal plan of care) Comprehensive medication review performed; medication list updated in electronic medical record  Hypertension (BP goal <130/80) -Controlled -Current treatment: Furosemide 40 mg 1 tablet daily Metoprolol 25 mg 0.5 tablet by mouth twice daily -Medications previously tried: n/a  -Current home readings: does not check consistently -Current dietary habits: did not discuss -Current exercise habits: limited with shortness of breath -Denies hypotensive/hypertensive symptoms -Educated on BP goals and benefits of medications  for prevention of heart attack, stroke and kidney damage; Importance of home blood pressure monitoring; Proper BP monitoring technique; -Counseled to monitor BP at home weekly, document, and provide log at future appointments -Counseled on diet and exercise extensively Recommended to continue current medication  Hyperlipidemia: (LDL goal < 70) -Uncontrolled -Current treatment: Simvastatin 40 mg 1 tablet daily at bedtime -Medications previously tried: atorvastatin (myalgias), Crestor (cost when name brand) -Current dietary patterns: did not discuss -Current exercise habits: limited with shortness of breath -Educated on Cholesterol goals;  Importance of limiting foods high in cholesterol; Exercise goal of 150 minutes per week; -Counseled on diet and exercise extensively Recommended switching simvastatin to rosuvastatin 20 mg.   Diabetes (A1c goal <7%) -Uncontrolled -Current medications: Farxiga 10 mg 1 tablet daily Metformin 500 mg 2 tablets by mouth twice daily with meals -Medications previously tried: n/a  -Current home glucose readings fasting glucose: 126, 90s post prandial glucose: does not check Before bedtime: 148 -Denies hypoglycemic/hyperglycemic symptoms -Current meal patterns:  breakfast: cottage cheese, banana or yogurt and fruit  or yogurt and blueberries lunch: biggest meal; baked chicken, red slaw, collards; manicotti and a salad; lots of beans; lots of salmon dinner: soup or PB crackers snacks: not often; couple of cookies yesterday; slushies drinks: majority water, diet coke; hot tea with lemon; orange juice, 2 times a week with coffee -Current exercise: uses an app that keeps up with her walking; walks two times a day (6 minutes) and some simple exercises -Educated on A1c and blood sugar goals; Exercise goal of 150 minutes per week; Carbohydrate counting and/or plate method -Counseled to check feet daily and get yearly eye exams -Counseled on diet and  exercise extensively Recommended to continue current medication Assessed patient finances.  Set aside 1 week of samples for patient to take until 2023.  CAD (Goal: prevent heart events) -Controlled -Current treatment  Nitrostat 0.4 mg SL 1 tablet under tongue every 5 mins as needed  Aspirin 81 mg 1 tablet daily Simvastatin 40 mg 1 tablet daily at bedtime -Medications previously tried: none  -Recommended to continue current medication   Heart Failure (Goal: manage symptoms and prevent exacerbations) -Controlled -Last ejection fraction: 60-65% (Date: 11/2020) -HF type: Systolic -NYHA Class: II (slight limitation of activity) -AHA HF Stage: C (Heart disease and symptoms present) -Current treatment: Furosemide 40 mg 1 tablet daily as needed Metoprolol 25 mg 0.5 tablet twice daily Farxiga 10 mg 1 tablet daily Klor-con 20 mEq 1 tablet daily as needed with furosemide -Medications previously tried: n/a  -Current home BP/HR readings: refer to above -Current dietary habits: did not discuss -Current exercise habits: refer to above -Educated on Importance of weighing daily; if you gain more than 3 pounds in one day or 5 pounds in one week, call cardiologist. Importance of blood pressure control -Counseled on diet and exercise extensively Recommended to continue current medication  COPD (Goal: control symptoms and prevent exacerbations) -Controlled -Current treatment  Montelukast 10 mg 1 tablet at bedtime Albuterol HFA 2 puffs into the lungs every 6 hrs as needed for wheezing or shortness of breath Trelegy 100-62.5-25 mcg/inh 1 puff daily -Medications previously tried: Anoro  -Gold Grade: Gold 2 (FEV1 50-79%) -Current COPD Classification:  B (high sx, <2 exacerbations/yr) -MMRC/CAT score: 3 -Pulmonary function testing: 2020 -Exacerbations requiring treatment in last 6 months: none -Patient reports consistent use of maintenance inhaler -Frequency of rescue inhaler use: almost every  day -Counseled on Benefits of consistent maintenance inhaler use When to use rescue inhaler Differences between maintenance and rescue inhalers -Counseled on diet and exercise extensively Recommended to continue current medication Patient is in a COPD study.  Osteopenia (Goal prevent fractures) -Not ideally controlled -Last DEXA Scan: could not locate   T-Score femoral neck: n/a  T-Score total hip: n/a  T-Score lumbar spine: n/a  T-Score forearm radius: n/a  10-year probability of major osteoporotic fracture: n/a  10-year probability of hip fracture: n/a -Patient is not a candidate for pharmacologic treatment -Current treatment  No medications -Medications previously tried: none  -Recommend weight-bearing and muscle strengthening exercises for building and maintaining bone density. - Reassess at follow up.  Gout (Goal: uric acid <6 and prevent flare ups) -Not ideally controlled -Current treatment  Allopurinol 100 mg 1 tablet twice daily -Medications previously tried: none -Recommended repeat uric acid level.  Restless legs syndrome (Goal: minimize symptoms) -Controlled -Current treatment  Ropinirole 0.5 mg start 0.5 tablet at bedtime for 2 nights, then increase to 1 tablet at bedtime -Medications previously tried: none  -Recommended to continue current medication   Health Maintenance -Vaccine gaps: shingrix, tetanus -Current therapy:  APAP 500 mg 2 tablets every 6 hrs as needed for mild pain Hydrocortisone 2.5% cream twice daily Ketoconazole 2% cream twice daily -Educated on Cost vs benefit of each product must be carefully weighed by individual consumer -Patient is satisfied with current therapy and denies issues -Recommended to continue current medication  Patient Goals/Self-Care Activities Patient will:  - take medications as prescribed check glucose daily, document, and provide at future appointments check blood pressure weekly, document, and provide at future  appointments target a minimum of 150 minutes of moderate intensity exercise weekly  Follow Up Plan: Telephone follow up appointment with care management team member scheduled for:  4 months     Patient Care Plan: RN Care Manager Plan of Care     Problem Identified: Chronic Disease Management and Care Coordination Needs (COPD,DM2,CHF,HTN)   Priority: High     Long-Range Goal: Establish Plan of Care for Chronic Disease Management Needs (COPD,DM2,CHF,HTN)   Start Date: 04/06/2021  Expected End Date: 10/03/2021  Recent Progress: On track  Priority: High  Note:   Current Barriers:  Care Coordination needs related to Financial constraints related to medication in coverage gap Chronic Disease Management support and education needs related to CHF, HTN, COPD, and DMII Financial Constraints  States she gets short of breath when she walks.  States she wears her O2 at all times.  Denies any chest pains or swelling.  States she weights daily and her weight stays around 240 most of the time.  States she will take a second Lasix if her weight goes up or if she has swelling in her legs as she has been directed to to do.  States she checks her CBG twice a day with fasting readings of 97-125 and at bedtime 140-152.  States she has not been drinking anything with sugar in it and she will work on losing weight after the holidays.  States she has enough samples of Farxiga to last her until the new year.   RNCM Clinical Goal(s):  Patient will verbalize basic understanding of  CHF, HTN, COPD, and DMII disease process and self health management plan as evidenced by voiced adherence to plan of care take all medications exactly as prescribed and will call provider for medication related questions as evidenced by dispense report and pt verbalization attend all scheduled medical appointments: CCM PharmD 05/23/21, Neurology 07/16/21 as evidenced by CCM PharmD 05/23/21, Neurology 07/16/21 demonstrate Improved adherence  to prescribed treatment plan for CHF, HTN, COPD, and DMII as evidenced by readings within limits, adherence to plan of care work with pharmacist to address Financial constraints related to [place] related toCHF, HTN, COPD, and DMII as evidenced by review or EMR and patient or pharmacist report through collaboration with Consulting civil engineer, provider, and care team.   Interventions: 1:1 collaboration with primary care provider regarding development and update of comprehensive plan of care as evidenced by provider attestation and co-signature Inter-disciplinary care team collaboration (see longitudinal plan of care) Evaluation of current treatment plan related to  self management and patient's adherence to plan as established by provider  Hypertension Interventions:Goal on track:  Yes.Long Term Goal Last practice recorded BP readings:  BP Readings from Last 3 Encounters:  01/16/21 126/73  01/01/21 100/60  12/25/20 116/70  Most recent eGFR/CrCl: No results found for: EGFR  No components found for: CRCL  Provided education to patient re: stroke prevention, s/s of heart attack and stroke Reviewed medications with patient and discussed importance of compliance Discussed plans with patient for ongoing care management follow up and provided patient with direct contact information for care management team Advised patient, providing education and rationale, to monitor blood pressure daily and record, calling PCP for findings outside established parameters Reviewed to follow a low sodium diet and to avoid salted chips  Diabetes Interventions:Goal on track:  Yes.Long Term Goal Assessed patient's understanding of A1c goal: <7% Provided education to patient about basic DM disease process Reviewed medications with patient and discussed importance of medication adherence Counseled on importance of regular laboratory monitoring as prescribed Discussed plans with patient for ongoing care management follow up  and provided patient with direct contact information  for care management team Advised patient, providing education and rationale, to check cbg twice a day and record, calling provider for findings outside established parameters Review of patient status, including review of consultants reports, relevant laboratory and other test results, and medications completed Reviewed fasting blood sugar goals of 80-130 and less than 180 1 1/2-2 hours after meals. Reviewed signs and symptoms of hypoglycemia and actions to take  Lab Results  Component Value Date   HGBA1C 7.4 (H) 10/03/2020   COPD Interventions:  Goal on track:  Yes.Long Term Goal New goal. Provided patient with basic written and verbal COPD education on self care/management/and exacerbation prevention Provided instruction about proper use of medications used for management of COPD including inhalers Advised patient to self assesses COPD action plan zone and make appointment with provider if in the yellow zone for 48 hours without improvement Advised patient to engage in light exercise as tolerated 3-5 days a week to aid in the the management of COPD Discussed the importance of adequate rest and management of fatigue with COPD Reviewed to call pulmonology to schedule follow up appointment in January  Heart Failure Interventions:Goal on track:  Yes.Long Term Goal New goal. Provided education on low sodium diet Discussed importance of daily weight and advised patient to weigh and record daily Reviewed role of diuretics in prevention of fluid overload and management of heart failure; Discussed the importance of keeping all appointments with provider Provided patient with education about the role of exercise in the management of heart failure Reinforced heart failure zones and when to take extra Lasix and when to contact her doctor  Patient Goals/Self-Care Activities: Take all medications as prescribed Attend all scheduled provider  appointments Call pharmacy for medication refills 3-7 days in advance of running out of medications Perform all self care activities independently  Call provider office for new concerns or questions  call office if I gain more than 2 pounds in one day or 5 pounds in one week keep legs up while sitting watch for swelling in feet, ankles and legs every day weigh myself daily follow rescue plan if symptoms flare-up eat more whole grains, fruits and vegetables, lean meats and healthy fats keep appointment with eye doctor check blood sugar at prescribed times: twice daily and when you have symptoms of low or high blood sugar check feet daily for cuts, sores or redness drink 6 to 8 glasses of water each day fill half of plate with vegetables manage portion size switch to sugar-free drinks identify and remove indoor air pollutants do breathing exercises every day follow rescue plan if symptoms flare-up get at least 7 to 8 hours of sleep at night check blood pressure daily take blood pressure log to all doctor appointments eat more whole grains, fruits and vegetables, lean meats and healthy fats limit salt intake to 2359m/day  Follow Up Plan:  Telephone follow up appointment with care management team member scheduled for:  07/17/21 The patient has been provided with contact information for the care management team and has been advised to call with any health related questions or concerns.           Patient verbalizes understanding of instructions provided today and agrees to view in [facility]  The pharmacy team will reach out to the patient again over the next 30 days.   MViona Gilmore [facility]

## 2021-05-23 NOTE — Progress Notes (Signed)
Chronic Care Management Pharmacy Note  05/23/2021 Name:  AJANEE BUREN MRN:  [id number] DOB:  02/11/1946  Summary: LDL not at goal < 70 A1c not at goal < 7%  Recommendations/Changes made from today's visit: -Recommend switching simvastatin to rosuvastatin 20 mg -Recommend repeat uric acid level and DEXA -Recommend annual low dose CT scan  Plan: Follow up in 4 months after PCP visit and next A1c   Subjective: FOSTER SONNIER is an 75 y.o. year old female who is a primary patient of Panosh, Standley Brooking, MD.  The CCM team was consulted for assistance with disease management and care coordination needs.    Engaged with patient by telephone for follow up visit in response to provider referral for pharmacy case management and/or care coordination services.   Consent to Services:  The patient was given information about Chronic Care Management services, agreed to services, and gave verbal consent prior to initiation of services.  Please see initial visit note for detailed documentation.   Patient Care Team: Panosh, Standley Brooking, MD as PCP - General Minus Breeding, MD as PCP - Cardiology (Cardiology) Paula Compton, MD as Attending Physician (Obstetrics and Gynecology) Irene Shipper, MD as Attending Physician (Gastroenterology) Sharyne Peach, MD as Consulting Physician (Ophthalmology) Minus Breeding, MD as Consulting Physician (Cardiology) Rigoberto Noel, MD as Consulting Physician (Pulmonary Disease) Izora Gala, MD as Consulting Physician (Otolaryngology) Viona Gilmore, [facility] as Pharmacist (Pharmacist) Dimitri Ped, RN as Case Manager  Recent office visits: 05/22/21 Shanon Ace, MD: Patient presented for rash. Prescribed doxycycline and valacyclovir if needed.  04/10/21 Shanon Ace, MD: Patient presented for annual exam. No med changes.  03/30/21 Randel Pigg, LPN: Patient presented for AWV.  02/20/21 [name] (PCP) - seen for respiratory tract infection due to  COVID-19 virus. Patient started on Nirmatrelvir- ritonavir 10 x 133m & 10 x 1020m2 tablets 2 times daily. Return in 2 days or as needed.   12/25/20 WaShanon AceMD: Patient presented for DM follow up. Plan to repeat potassium. Prescribed ketoconazole.   12/05/20 WaShanon AceMD: Patient presented for hospital follow up.   Recent consult visits: 02/02/21 MeEbbie LatusDO (ENT): Patient presented for blood in ear canal. Prescribed ofloxacin 0.3% otic solution.   01/16/21 PrAntony ContrasMD (neurology): Patient presented for initial visit for cerebral aneurysm. Recommended repeat imaging in 6 months.  01/01/21 JaMinus BreedingMD (cardiology): Patient presented for SOB and CAD follow up. No medication changes.  09/26/20 RoBeckie SaltsMD (Otolaryngology): patient presented for follow-up  08/07/20 HoMinus BreedingMD (Cardiology): patient presented for CAD follow-up.  Hospital visits: Medication Reconciliation was completed by comparing discharge summary, patients EMR and Pharmacy list, and upon discussion with patient.   Admitted to the hospital on 07.05.2022 due to Acute encephalopathy. Discharge date was 07.08.2022. [facility] -started the following medication  Oxycodone 5 MG Take 1 tablet (5 mg total) by mouth every 6 (six)hours as needed for severe pain.   Medication Changes at Hospital Discharge: -Changed none   Medications Discontinued at Hospital Discharge: -Stopped none   Medications that remain the same after Hospital Discharge:??  -All other medications will remain the same.   [facility]   Objective:  Lab Results  Component Value Date   CREATININE 0.90 04/10/2021   BUN 24 (H) 04/10/2021   GFR 62.97  04/10/2021   GFRNONAA >60 12/01/2020   GFRAA >60 11/12/2018   NA 140 04/10/2021   K 4.0 04/10/2021    CALCIUM 9.8 04/10/2021   CO2 37 (H) 04/10/2021   GLUCOSE 103 (H) 04/10/2021    Lab Results  Component Value Date/Time   HGBA1C 7.5 (H) 04/10/2021 10:25 AM   HGBA1C 7.4 (H) 10/03/2020 09:42 AM   GFR 62.97 04/10/2021 10:25 AM   GFR 58.40 (L) 12/25/2020 11:38 AM   MICROALBUR 2.8 (H) 03/27/2020 08:06 AM   MICROALBUR 0.7 04/02/2019 09:05 AM    Last diabetic Eye exam:  Lab Results  Component Value Date/Time   HMDIABEYEEXA No Retinopathy 05/10/2021 12:00 AM    Last diabetic Foot exam: No results found for: HMDIABFOOTEX   Lab Results  Component Value Date   CHOL 169 04/10/2021   HDL 41.70 04/10/2021   LDLCALC 96 04/10/2021   LDLDIRECT 116.0 02/05/2019   TRIG 158.0 (H) 04/10/2021   CHOLHDL 4 04/10/2021    Hepatic Function Latest Ref Rng & Units 11/28/2020 10/03/2020 03/27/2020  Total Protein 6.5 - 8.1 g/dL 6.3(L) 6.9 6.5  Albumin 3.5 - 5.0 g/dL 3.1(L) 4.0 3.8  AST 15 - 41 U/L 28 25 46(H)  ALT 0 - 44 U/L 25 22 34  Alk Phosphatase 38 - 126 U/L 67 80 95  Total Bilirubin 0.3 - 1.2 mg/dL 0.7 0.5 0.6  Bilirubin, Direct 0.0 - 0.3 mg/dL - 0.1 0.2    Lab Results  Component Value Date/Time   TSH 3.354 11/29/2020 12:03 AM   TSH 3.37 10/03/2020 09:42 AM   TSH 3.46 03/27/2020 08:06 AM   FREET4 0.81 10/03/2020 09:42 AM    CBC Latest Ref Rng & Units 11/28/2020 11/28/2020 10/03/2020  WBC 4.0 - 10.5 K/uL - 8.5 7.2  Hemoglobin 12.0 - 15.0 g/dL 13.6 12.1 14.6  Hematocrit 36.0 - 46.0 % 40.0 40.6 44.4  Platelets 150 - 400 K/uL - 248 218.0    No results found for: VD25OH  Clinical ASCVD: Yes  The ASCVD Risk score (Arnett DK, et al., 2019) failed to calculate for the following reasons:   The patient has a prior MI or stroke diagnosis    Depression screen [facility] 2/9 05/22/2021 04/06/2021 03/30/2021  Decreased Interest 0 0 0  Down, Depressed, Hopeless 0 0 0  PHQ - 2 Score 0 0 0  Altered sleeping 0 - -  Tired, decreased energy 0 - -  Change in appetite 0 - -  Feeling bad or failure about  yourself  0 - -  Trouble concentrating 0 - -  Moving slowly or fidgety/restless 0 - -  Suicidal thoughts 0 - -  PHQ-9 Score 0 - -  Difficult doing work/chores - - -  Some recent data might be hidden      Social History   Tobacco Use  Smoking Status Former   Packs/day: 1.00   Years: 39.00   Pack years: 39.00   Types: Cigarettes   Quit date: 11/04/2018   Years since quitting: 2.5  Smokeless Tobacco Never   BP Readings from Last 3 Encounters:  05/22/21 (!) 108/50  04/10/21 114/60  01/16/21 126/73   Pulse Readings from Last 3 Encounters:  05/22/21 88  04/10/21 87  02/20/21 78   Wt Readings from Last 3 Encounters:  05/22/21 246 lb 12.8 oz (111.9 kg)  04/10/21 245 lb 6.4 oz (111.3 kg)  02/20/21 243 lb 8 oz (110.5 kg)   BMI Readings from Last 3 Encounters:  05/22/21 43.72  kg/m  04/10/21 43.47 kg/m  02/20/21 43.13 kg/m    Assessment/Interventions: Review of patient past medical history, allergies, medications, health status, including review of consultants reports, laboratory and other test data, was performed as part of comprehensive evaluation and provision of chronic care management services.   SDOH:  (Social Determinants of Health) assessments and interventions performed: Yes   SDOH Screenings   Alcohol Screen: Low Risk    Last Alcohol Screening Score (AUDIT): 1  Depression (PHQ2-9): Low Risk    PHQ-2 Score: 0  Financial Resource Strain: Low Risk    Difficulty of Paying Living Expenses: Not very hard  Food Insecurity: No Food Insecurity   Worried About Charity fundraiser in the Last Year: Never true   Ran Out of Food in the Last Year: Never true  Housing: Low Risk    Last Housing Risk Score: 0  Physical Activity: Insufficiently Active   Days of Exercise per Week: 5 days   Minutes of Exercise per Session: 20 min  Social Connections: Moderately Isolated   Frequency of Communication with Friends and Family: Twice a week   Frequency of Social Gatherings  with Friends and Family: Twice a week   Attends Religious Services: More than 4 times per year   Active Member of [employer] or Organizations: No   Attends Music therapist: Never   Marital Status: Divorced  Stress: No Stress Concern Present   Feeling of Stress : Not at all  Tobacco Use: Medium Risk   Smoking Tobacco Use: Former   Smokeless Tobacco Use: Never   Passive Exposure: Not on [employer] consultant Needs: No Transportation Needs   Lack of Transportation (Medical): No   Lack of Transportation (Non-Medical): No    CCM Care Plan  Allergies  Allergen Reactions   Atorvastatin     REACTION: myalgias  and liver    Medications Reviewed Today     Reviewed by Burnis Medin, MD (Physician) on 05/22/21 at [place] Status: <None>   Medication Order Taking? Sig Documenting Provider Last Dose Status Informant  Accu-Chek Softclix Lancets lancets [id number] Yes Use as instructed twice a day Diagnosis E11.9 Panosh, Standley Brooking, MD Taking Active   acetaminophen (TYLENOL) 500 MG tablet [id number] Yes Take 1,000 mg by mouth every 6 (six) hours as needed for mild pain. [provider] Taking Active Child  albuterol (PROVENTIL) (2.5 MG/3ML) 0.083% nebulizer solution [id number] Yes Take 3 mLs (2.5 mg total) by nebulization every 6 (six) hours as needed for wheezing or shortness of breath. Parrett, Fonnie Mu, NP Taking Active Child  allopurinol (ZYLOPRIM) 100 MG tablet [id number] Yes TAKE 1 TABLET BY MOUTH TWICE A DAY Panosh, Standley Brooking, MD Taking Active   aspirin 81 MG tablet [id number] Yes Take 81 mg by mouth daily. [provider] Taking Active Child  Blood Glucose Monitoring Suppl (ACCU-CHEK GUIDE) w/Device KIT [id number] Yes Use as directed twice a day Diagnosis E11.9 Panosh, Standley Brooking, MD Taking Active   dapagliflozin propanediol (FARXIGA) 10 MG TABS tablet [id number] Yes Take 1 tablet (10 mg total) by mouth daily before breakfast. Panosh, Standley Brooking, MD Taking Active    dapagliflozin propanediol (FARXIGA) 10 MG TABS tablet [id number] Yes Take 1 tablet (10 mg total) by mouth daily before breakfast. Panosh, Standley Brooking, MD Taking Active   furosemide (LASIX) 40 MG tablet [id number] Yes TAKE 1 TABLET BY MOUTH EVERY DAY Panosh, Standley Brooking, MD Taking Active   glucose blood (ACCU-CHEK GUIDE) test strip [id number]  Yes Use as instructed twice a day Diagnosis E11.9 Panosh, Standley Brooking, MD Taking Active   ketoconazole (NIZORAL) 2 % cream [id number] Yes Apply 1 application topically 2 (two) times daily. Panosh, Standley Brooking, MD Taking Active   KLOR-CON M10 10 MEQ tablet [id number] Yes TAKE 1 TABLET BY MOUTH 2 TIMES DAILY. Panosh, Standley Brooking, MD Taking Active   metFORMIN (GLUCOPHAGE) 500 MG tablet [id number] Yes TAKE 1 TABLET BY MOUTH TWICE A DAY WITH A MEAL INCREASE TO 2 TABLETS TWICE A DAY WITH A MEAL Panosh, Standley Brooking, MD Taking Active   metoprolol tartrate (LOPRESSOR) 25 MG tablet [id number] Yes TAKE 1/2 TABLETS BY MOUTH 2 TIMES DAILY Panosh, Standley Brooking, MD Taking Active   montelukast (SINGULAIR) 10 MG tablet [id number] Yes TAKE 1 TABLET (10 MG TOTAL) AT BEDTIME BY MOUTH. Panosh, Standley Brooking, MD Taking Active Child  nitroGLYCERIN (NITROSTAT) 0.4 MG SL tablet [id number] Yes PLACE 1 TABLET UNDER THE TONGUE EVERY 5 MINUTES AS NEEDED. Panosh, Standley Brooking, MD Taking Active Child  predniSONE (DELTASONE) 10 MG tablet [id number] Yes Take 4 tabs by mouth once daily x4 days, then 3 tabs x4 days, 2 tabs x4 days, 1 tab x4 days and stop. Rigoberto Noel, MD Taking Active   PROAIR HFA 108 [phone] Base) MCG/ACT inhaler [id number] Yes TAKE 2 PUFFS BY MOUTH EVERY 6 HOURS AS NEEDED FOR WHEEZE OR SHORTNESS OF Willia Craze, MD Taking Active Child  rOPINIRole (REQUIP) 0.5 MG tablet [id number] Yes 1 TAB AT BEDTIME FOR RESTLESS LEG Rigoberto Noel, MD Taking Active   simvastatin (ZOCOR) 40 MG tablet [id number] Yes Take 1 tablet (40 mg total) by mouth at bedtime. Panosh, Standley Brooking, MD Taking Active Child  TRELEGY ELLIPTA 100-62.5-25 MCG/ACT  AEPB [id number] Yes TAKE 1 PUFF BY MOUTH EVERY DAY Rigoberto Noel, MD Taking Active             Patient Active Problem List   Diagnosis Date Noted   Elevated troponin 11/28/2020   Anterior communicating artery aneurysm 11/28/2020   Thyroid nodule 11/28/2020   Contusion of right foot    Educated about COVID-19 virus infection 08/04/2019   COPD (chronic obstructive pulmonary disease) ([place]) 12/29/2018   Restless leg syndrome 12/22/2018   Congestive heart failure ([place]) 12/13/2018   Acute on chronic diastolic CHF (congestive heart failure) ([place]) 11/05/2018   Acute CHF (congestive heart failure) ([place]) 11/04/2018   CHF (congestive heart failure) ([place]) 11/04/2018   Diabetes mellitus type II, non insulin dependent ([place]) 11/04/2018   Great toe pain, left 01/01/2018   Myocardial infarction [facility])    Elevated hemoglobin ([place]) 04/24/2014   Visit for preventive health examination 04/24/2014   Pre-diabetes 08/12/2012   Decreased hearing 04/12/2012   Welcome to Medicare preventive visit 04/08/2012   HYPERURICEMIA 03/15/2009   GOUT 12/13/2008   OBESITY 08/23/2008   OSTEOPENIA 09/01/2007   MYOCARDIAL INFARCTION, HX OF 07/14/2007   SOLITARY KIDNEY 07/14/2007   Hyperlipidemia 04/15/2007   Essential hypertension 04/15/2007   CAD (coronary artery disease) 04/15/2007   OTHER ABNORMAL GLUCOSE 04/15/2007   TOBACCO ABUSE, HX OF 04/15/2007    Immunization History  Administered Date(s) Administered   Fluad Quad(high Dose 65+) 02/11/2019, 02/29/2020, 04/10/2021   Influenza Split 04/29/2011, 04/08/2012   Influenza Whole 02/16/2008, 03/15/2009, 03/20/2010   Influenza, High Dose Seasonal PF 04/14/2013, 02/14/2015, 02/28/2016, 03/10/2017, 03/25/2018   Influenza,inj,Quad PF,6+ Mos 04/18/2014   Influenza,inj,quad, With Preservative 02/25/2019   Influenza-Unspecified 02/24/2017   PFIZER(Purple Top)SARS-COV-2 Vaccination 06/15/2019, 07/06/2019, 04/13/2020  Pneumococcal Conjugate-13 04/08/2012    Pneumococcal Polysaccharide-23 04/14/2013   Td 09/14/2007   Unspecified SARS-COV-2 Vaccination 04/25/2021   Zoster, Live 09/14/2007   Patient reports she is working on losing weight and has been making changes to her diet. She is now eating the biggest meal of the day at lunch and then eating a light dinner. She is also planning on going back to protein shakes in the morning and at dinner as this helped with weight loss in the past.  Conditions to be addressed/monitored:  Hypertension, Hyperlipidemia, Diabetes, Heart Failure, Coronary Artery Disease, COPD, Osteopenia, and Gout  Conditions addressed this visit: Diabetes, hyperlipidemia  Care Plan : CCM Pharmacy Care Plan  Updates made by Viona Gilmore, [place] since 05/23/2021 12:00 AM     Problem: Problem: Hypertension, Hyperlipidemia, Diabetes, Heart Failure, Coronary Artery Disease, COPD, Osteopenia, and Gout      Long-Range Goal: Patient-Specific Goal   Start Date: 02/07/2021  Expected End Date: 02/07/2022  Recent Progress: On track  Priority: High  Note:   Current Barriers:  Unable to independently monitor therapeutic efficacy Unable to achieve control of diabetes  Unable to maintain control of cholesterol  Pharmacist Clinical Goal(s):  Patient will achieve control of diabetes as evidenced by A1c maintain control of cholesterol as evidenced by next lipid panel  through collaboration with PharmD and provider.   Interventions: 1:1 collaboration with Panosh, Standley Brooking, MD regarding development and update of comprehensive plan of care as evidenced by provider attestation and co-signature Inter-disciplinary care team collaboration (see longitudinal plan of care) Comprehensive medication review performed; medication list updated in electronic medical record  Hypertension (BP goal <130/80) -Controlled -Current treatment: Furosemide 40 mg 1 tablet daily Metoprolol 25 mg 0.5 tablet by mouth twice daily -Medications previously  tried: n/a  -Current home readings: does not check consistently -Current dietary habits: did not discuss -Current exercise habits: limited with shortness of breath -Denies hypotensive/hypertensive symptoms -Educated on BP goals and benefits of medications for prevention of heart attack, stroke and kidney damage; Importance of home blood pressure monitoring; Proper BP monitoring technique; -Counseled to monitor BP at home weekly, document, and provide log at future appointments -Counseled on diet and exercise extensively Recommended to continue current medication  Hyperlipidemia: (LDL goal < 70) -Uncontrolled -Current treatment: Simvastatin 40 mg 1 tablet daily at bedtime -Medications previously tried: atorvastatin (myalgias), Crestor (cost when name brand) -Current dietary patterns: did not discuss -Current exercise habits: limited with shortness of breath -Educated on Cholesterol goals;  Importance of limiting foods high in cholesterol; Exercise goal of 150 minutes per week; -Counseled on diet and exercise extensively Recommended switching simvastatin to rosuvastatin 20 mg.   Diabetes (A1c goal <7%) -Uncontrolled -Current medications: Farxiga 10 mg 1 tablet daily Metformin 500 mg 2 tablets by mouth twice daily with meals -Medications previously tried: n/a  -Current home glucose readings fasting glucose: 126, 90s post prandial glucose: does not check Before bedtime: 148 -Denies hypoglycemic/hyperglycemic symptoms -Current meal patterns:  breakfast: cottage cheese, banana or yogurt and fruit  or yogurt and blueberries lunch: biggest meal; baked chicken, red slaw, collards; manicotti and a salad; lots of beans; lots of salmon dinner: soup or PB crackers snacks: not often; couple of cookies yesterday; slushies drinks: majority water, diet coke; hot tea with lemon; orange juice, 2 times a week with coffee -Current exercise: uses an app that keeps up with her walking; walks two  times a day (6 minutes) and some simple exercises -Educated on A1c and  blood sugar goals; Exercise goal of 150 minutes per week; Carbohydrate counting and/or plate method -Counseled to check feet daily and get yearly eye exams -Counseled on diet and exercise extensively Recommended to continue current medication Assessed patient finances. Set aside 1 week of samples for patient to take until 2023.  CAD (Goal: prevent heart events) -Controlled -Current treatment  Nitrostat 0.4 mg SL 1 tablet under tongue every 5 mins as needed  Aspirin 81 mg 1 tablet daily Simvastatin 40 mg 1 tablet daily at bedtime -Medications previously tried: none  -Recommended to continue current medication   Heart Failure (Goal: manage symptoms and prevent exacerbations) -Controlled -Last ejection fraction: 60-65% (Date: 11/2020) -HF type: Systolic -NYHA Class: II (slight limitation of activity) -AHA HF Stage: C (Heart disease and symptoms present) -Current treatment: Furosemide 40 mg 1 tablet daily as needed Metoprolol 25 mg 0.5 tablet twice daily Farxiga 10 mg 1 tablet daily Klor-con 20 mEq 1 tablet daily as needed with furosemide -Medications previously tried: n/a  -Current home BP/HR readings: refer to above -Current dietary habits: did not discuss -Current exercise habits: refer to above -Educated on Importance of weighing daily; if you gain more than 3 pounds in one day or 5 pounds in one week, call cardiologist. Importance of blood pressure control -Counseled on diet and exercise extensively Recommended to continue current medication  COPD (Goal: control symptoms and prevent exacerbations) -Controlled -Current treatment  Montelukast 10 mg 1 tablet at bedtime Albuterol HFA 2 puffs into the lungs every 6 hrs as needed for wheezing or shortness of breath Trelegy 100-62.5-25 mcg/inh 1 puff daily -Medications previously tried: Anoro  -Gold Grade: Gold 2 (FEV1 50-79%) -Current COPD Classification:   B (high sx, <2 exacerbations/yr) -MMRC/CAT score: 3 -Pulmonary function testing: 2020 -Exacerbations requiring treatment in last 6 months: none -Patient reports consistent use of maintenance inhaler -Frequency of rescue inhaler use: almost every day -Counseled on Benefits of consistent maintenance inhaler use When to use rescue inhaler Differences between maintenance and rescue inhalers -Counseled on diet and exercise extensively Recommended to continue current medication Patient is in a COPD study.  Osteopenia (Goal prevent fractures) -Not ideally controlled -Last DEXA Scan: could not locate   T-Score femoral neck: n/a  T-Score total hip: n/a  T-Score lumbar spine: n/a  T-Score forearm radius: n/a  10-year probability of major osteoporotic fracture: n/a  10-year probability of hip fracture: n/a -Patient is not a candidate for pharmacologic treatment -Current treatment  No medications -Medications previously tried: none  -Recommend weight-bearing and muscle strengthening exercises for building and maintaining bone density. - Reassess at follow up.  Gout (Goal: uric acid <6 and prevent flare ups) -Not ideally controlled -Current treatment  Allopurinol 100 mg 1 tablet twice daily -Medications previously tried: none -Recommended repeat uric acid level.  Restless legs syndrome (Goal: minimize symptoms) -Controlled -Current treatment  Ropinirole 0.5 mg start 0.5 tablet at bedtime for 2 nights, then increase to 1 tablet at bedtime -Medications previously tried: none  -Recommended to continue current medication   Health Maintenance -Vaccine gaps: shingrix, tetanus -Current therapy:  APAP 500 mg 2 tablets every 6 hrs as needed for mild pain Hydrocortisone 2.5% cream twice daily Ketoconazole 2% cream twice daily -Educated on Cost vs benefit of each product must be carefully weighed by individual consumer -Patient is satisfied with current therapy and denies  issues -Recommended to continue current medication  Patient Goals/Self-Care Activities Patient will:  - take medications as prescribed check glucose daily, document, and provide  at future appointments check blood pressure weekly, document, and provide at future appointments target a minimum of 150 minutes of moderate intensity exercise weekly  Follow Up Plan: Telephone follow up appointment with care management team member scheduled for: 4 months      Medication Assistance: Application for Farxiga  medication assistance program. in process.  Anticipated assistance start date 03/25/21.  See plan of care for additional detail.  Compliance/Adherence/Medication fill history: Care Gaps: Tetanus, foot exam, microalbumin Last A1C - 7.5 on 04/10/21 Last BP - 108/50 on 05/22/2021  Star-Rating Drugs: Metformin 567m - last filled on 02/21/21 [facility] Simvastatin 41m- last filled on 03/29/2021 [facility] Dapagliflozin 1053m last filled on [facility]/24/21 [id number] [id number]: [phone]: [phone] pill box? Yes Pt endorses 100% compliance  We discussed: Current pharmacy is preferred with insurance plan and patient is satisfied with pharmacy services Patient decided to: Continue current medication management strategy  Care Plan and Follow Up Patient Decision:  Patient agrees to Care Plan and Follow-up.  Plan: Telephone follow up appointment with care management team member scheduled for:  4 months  MadeJeni SallesarmD, BCACOneidarmacist [place] Park[place]ras[place]oll[place]-[phone]

## 2021-05-24 ENCOUNTER — Other Ambulatory Visit: Payer: Self-pay | Admitting: Internal Medicine

## 2021-05-26 ENCOUNTER — Other Ambulatory Visit: Payer: Self-pay | Admitting: Pulmonary Disease

## 2021-05-26 DIAGNOSIS — I10 Essential (primary) hypertension: Secondary | ICD-10-CM

## 2021-05-26 DIAGNOSIS — E1165 Type 2 diabetes mellitus with hyperglycemia: Secondary | ICD-10-CM

## 2021-05-28 ENCOUNTER — Other Ambulatory Visit: Payer: Self-pay | Admitting: Pulmonary Disease

## 2021-05-30 ENCOUNTER — Other Ambulatory Visit: Payer: Self-pay

## 2021-05-30 MED ORDER — DAPAGLIFLOZIN PROPANEDIOL 5 MG PO TABS: 5.0000 mg | ORAL_TABLET | Freq: Every day | ORAL | 0 refills | Status: DC

## 2021-06-08 DIAGNOSIS — H6501 Acute serous otitis media, right ear: Secondary | ICD-10-CM | POA: Diagnosis not present

## 2021-06-13 DIAGNOSIS — I5033 Acute on chronic diastolic (congestive) heart failure: Secondary | ICD-10-CM | POA: Diagnosis not present

## 2021-06-13 DIAGNOSIS — J449 Chronic obstructive pulmonary disease, unspecified: Secondary | ICD-10-CM | POA: Diagnosis not present

## 2021-06-25 ENCOUNTER — Other Ambulatory Visit: Payer: Self-pay | Admitting: Internal Medicine

## 2021-06-27 ENCOUNTER — Ambulatory Visit: Payer: Medicare Other | Admitting: Pulmonary Disease

## 2021-06-28 ENCOUNTER — Inpatient Hospital Stay: Admission: RE | Admit: 2021-06-28 | Payer: Medicare Other | Source: Ambulatory Visit

## 2021-06-29 ENCOUNTER — Ambulatory Visit
Admission: RE | Admit: 2021-06-29 | Discharge: 2021-06-29 | Disposition: A | Discharge status: Home / Self Care | Payer: Medicare Other | Source: Ambulatory Visit | Attending: Neurology | Admitting: Neurology

## 2021-06-29 ENCOUNTER — Other Ambulatory Visit: Payer: Self-pay

## 2021-06-29 ENCOUNTER — Other Ambulatory Visit: Payer: Self-pay | Admitting: Neurology

## 2021-06-29 DIAGNOSIS — I671 Cerebral aneurysm, nonruptured: Secondary | ICD-10-CM | POA: Diagnosis not present

## 2021-06-29 MED ORDER — IOPAMIDOL (ISOVUE-370) INJECTION 76%
75.0000 mL | Freq: Once | INTRAVENOUS | Status: AC | PRN
  Administered 2021-06-29: 75 mL via INTRAVENOUS

## 2021-07-04 NOTE — Progress Notes (Signed)
Cardiology Office Note   Date:  07/05/2021   ID:  Mehlani, Blankenburg 10/16/1945, MRN [id number]  PCP:  Burnis Medin, MD  Cardiologist:   Minus Breeding, MD   Chief Complaint  Patient presents with   Coronary Artery Disease       History of Present Illness: Rebecca Mcintyre is a 76 y.o. female who presents for evaluation of CAD. I saw her in 2014.  She has a history of an anterior MI in 2002.   She had DES to the LAD.    She was in the hospital after syncopal episode in June 2020.    She had acute hypoxic respiratory failure.  She had acute on chronic diastolic dysfunction.  She had acute cor pulmonale.  She did require some diuresis.  She had an echocardiogram which demonstrated well-preserved left ventricular function with some suggestion of diastolic dysfunction.  There was elevated pulmonary pressures noted.  She had moderate RV dysfunction.  She did have diuresis of 10.2 L.  She was sent home on 40 mg of Lasix.  She was also sent home on home O2 and pulmonary meds as listed.  She did have a follow-up echo.  Her pulmonary pressures seem to be reduced and RV function improved.  In July 2022 she was hospitalized with acute on chronic respiratory failure with acute on chronic diastolic HF.  She had acute encephalopathy thought to be related to narcotics.  During that hospitalization she had heart failure with preserved ejection fraction require diuresis.  She had an elevated troponin which was felt to be demand ischemia.  Echo demonstrated no new wall motion abnormalities.   She said she felt well yesterday.  She actually was so well that she did a lot today she is feeling more short of breath.  When she gets short of breath she does increase her Lasix from 40 to 80 mg a day.  She has not done that yet today because she was coming here.  She has some chronic lower extremity swelling.  She is on chronic oxygen.  She is not having new PND or orthopnea.  She is not having new cough fevers or  chills.  She is not having any chest discomfort.  She does not feel her heart racing or skipping.   Past Medical History:  Diagnosis Date   Acute on chronic respiratory failure with hypoxia and hypercapnia (HCC) 11/05/2018   Allergy    ASCUS on Pap smear    had repeat x 2 normal with [name] Plan 2011   Coronary artery disease    Gout    Hyperlipidemia    Hypertension    Myocardial infarction [facility])    2002 95% proximal LAD stenosis with thrombus followed by 90% stenosis, the first diagonal 80% stenosis, circumflex 30% stenosis, circumflex obtuse marginal subbranch 70% stenosis, PDA 40% stenosis. She had a drug-eluting stent placement with a Pixel stent   Osteopenia    Solitary kidney     Past Surgical History:  Procedure Laterality Date   CARDIAC CATHETERIZATION     CORONARY ANGIOPLASTY     CORONARY ANGIOPLASTY WITH STENT PLACEMENT  2002   HEMORRHOID SURGERY  2011   LAD Stent angioplasty  2002   Right oophorectomy with salpingectomy     benign growth Dr. Joneen Caraway     Current Outpatient Medications  Medication Sig Dispense Refill   Accu-Chek Softclix Lancets lancets Use as instructed twice a day Diagnosis E11.9 100 each  3   acetaminophen (TYLENOL) 500 MG tablet Take 1,000 mg by mouth every 6 (six) hours as needed for mild pain.     albuterol (PROVENTIL) (2.5 MG/3ML) 0.083% nebulizer solution Take 3 mLs (2.5 mg total) by nebulization every 6 (six) hours as needed for wheezing or shortness of breath. 300 mL 11   allopurinol (ZYLOPRIM) 100 MG tablet TAKE 1 TABLET BY MOUTH TWICE A DAY 180 tablet 1   aspirin 81 MG tablet Take 81 mg by mouth daily.     Blood Glucose Monitoring Suppl (ACCU-CHEK GUIDE) w/Device KIT Use as directed twice a day Diagnosis E11.9 1 kit 2   dapagliflozin propanediol (FARXIGA) 5 MG TABS tablet Take 1 tablet (5 mg total) by mouth daily before breakfast. 14 tablet 0   furosemide (LASIX) 40 MG tablet TAKE 1 TABLET BY MOUTH EVERY DAY 90 tablet 1   glucose blood  (ACCU-CHEK GUIDE) test strip Use as instructed twice a day Diagnosis E11.9 100 each 3   ketoconazole (NIZORAL) 2 % cream Apply 1 application topically 2 (two) times daily. 30 g 0   KLOR-CON M10 10 MEQ tablet TAKE 1 TABLET BY MOUTH 2 TIMES DAILY. 180 tablet 1   metFORMIN (GLUCOPHAGE) 500 MG tablet TAKE 1 TABLET BY MOUTH TWICE A DAY WITH A MEAL INCREASE TO 2 TABLETS TWICE A DAY WITH A MEAL 360 tablet 0   metoprolol tartrate (LOPRESSOR) 25 MG tablet TAKE 1/2 TABLETS BY MOUTH 2 TIMES DAILY 90 tablet 1   montelukast (SINGULAIR) 10 MG tablet TAKE 1 TABLET (10 MG TOTAL) AT BEDTIME BY MOUTH. 90 tablet 0   nitroGLYCERIN (NITROSTAT) 0.4 MG SL tablet PLACE 1 TABLET UNDER THE TONGUE EVERY 5 MINUTES AS NEEDED. 25 tablet 1   PROAIR HFA 108 (90 Base) MCG/ACT inhaler TAKE 2 PUFFS BY MOUTH EVERY 6 HOURS AS NEEDED FOR WHEEZE OR SHORTNESS OF BREATH 8.5 each 3   rOPINIRole (REQUIP) 0.5 MG tablet TAKE 1 TABLET AT BEDTIME FOR RESTLESS LEG 90 tablet 0   simvastatin (ZOCOR) 40 MG tablet TAKE 1 TABLET BY MOUTH EVERYDAY AT BEDTIME 90 tablet 3   TRELEGY ELLIPTA 100-62.5-25 MCG/ACT AEPB TAKE 1 PUFF BY MOUTH EVERY DAY 60 each 3   No current facility-administered medications for this visit.    Allergies:   Atorvastatin    ROS:  Please see the history of present illness.   Otherwise, review of systems are positive for none.   All other systems are reviewed and negative.    PHYSICAL EXAM: VS:  BP 108/62    Pulse 87    Ht 5' 3" (1.6 m)    Wt 242 lb (109.8 kg)    SpO2 91%    BMI 42.87 kg/m  , BMI Body mass index is 42.87 kg/m. GENERAL:  Well appearing NECK:  No jugular venous distention, waveform within normal limits, carotid upstroke brisk and symmetric, no bruits, no thyromegaly LUNGS: Decreased breath sounds throughout without crackles CHEST:  Unremarkable HEART:  PMI not displaced or sustained,S1 and S2 within normal limits, no S3, no S4, no clicks, no rubs, no murmurs ABD:  Flat, positive bowel sounds normal in  frequency in pitch, no bruits, no rebound, no guarding, no midline pulsatile mass, no hepatomegaly, no splenomegaly EXT:  2 plus pulses throughout, mild bilateral lower extremity edema, no cyanosis no clubbing, chronic venous stasis changes   EKG:  EKG is ordered today. Sinus rhythm, rate 85,  axis within normal limits, intervals within normal limits, poor anterior R wave  progression, no acute ST-T wave changes.   Recent Labs: 11/28/2020: ALT 25; B Natriuretic Peptide 578.9; Hemoglobin 13.6; Platelets 248 11/29/2020: TSH 3.354 12/25/2020: Magnesium 1.9; Pro B Natriuretic peptide (BNP) 285.0 04/10/2021: BUN 24; Creatinine, Ser 0.90; Potassium 4.0; Sodium 140    Lipid Panel    Component Value Date/Time   CHOL 169 04/10/2021 1025   TRIG 158.0 (H) 04/10/2021 1025   HDL 41.70 04/10/2021 1025   CHOLHDL 4 04/10/2021 1025   VLDL 31.6 04/10/2021 1025   LDLCALC 96 04/10/2021 1025   LDLDIRECT 116.0 02/05/2019 1105      Wt Readings from Last 3 Encounters:  07/05/21 242 lb (109.8 kg)  05/22/21 246 lb 12.8 oz (111.9 kg)  04/10/21 245 lb 6.4 oz (111.3 kg)      Other studies Reviewed: Additional studies/ records that were reviewed today include: Labs Review of the above records demonstrates:  Please see elsewhere in the note.     ASSESSMENT AND PLAN:  CAD: The patient has no new sypmtoms.  No further cardiovascular testing is indicated.  We will continue with aggressive risk reduction and meds as listed.  DYSLIPIDEMIA:     LDL was 96.  She has been intolerant of other medications.  I will continue her on the simvastatin.  HTN:  The blood pressure is at target.  No change in therapy.   DIABETES:  Her hemoglobin A1c was down to 7.5 from a peak of 10.1.   CHRONIC DIASTOLIC DYSFUNCTION:   We talked about as needed dosing of her Lasix and she is actually to take a little more for couple days.  She is working much more on salt.  We talked about fluid restriction and she seems to handle this.  I  do not think her issues are predominantly volume but this does play a role.  She is seeing her pulmonologist soon.   Current medicines are reviewed at length with the patient today.  The patient does not have concerns regarding medicines.  The following changes have been made: None  Labs/ tests ordered today include:      Orders Placed This Encounter  Procedures   Basic Metabolic Panel (BMET)   EKG 12-Lead    Disposition:   FU with me or APP in 4 months.   Signed, Minus Breeding, MD  07/05/2021 11:23 AM    [facility]

## 2021-07-05 ENCOUNTER — Other Ambulatory Visit: Payer: Self-pay

## 2021-07-05 ENCOUNTER — Encounter: Payer: Self-pay | Admitting: Cardiology

## 2021-07-05 ENCOUNTER — Encounter: Payer: Self-pay | Admitting: Neurology

## 2021-07-05 ENCOUNTER — Ambulatory Visit: Payer: Medicare Other | Admitting: Cardiology

## 2021-07-05 VITALS — BP 108/62 | HR 87 | Ht 63.0 in | Wt 242.0 lb

## 2021-07-05 DIAGNOSIS — I10 Essential (primary) hypertension: Secondary | ICD-10-CM | POA: Diagnosis not present

## 2021-07-05 DIAGNOSIS — E118 Type 2 diabetes mellitus with unspecified complications: Secondary | ICD-10-CM

## 2021-07-05 DIAGNOSIS — E785 Hyperlipidemia, unspecified: Secondary | ICD-10-CM | POA: Diagnosis not present

## 2021-07-05 DIAGNOSIS — I5032 Chronic diastolic (congestive) heart failure: Secondary | ICD-10-CM

## 2021-07-05 DIAGNOSIS — I2510 Atherosclerotic heart disease of native coronary artery without angina pectoris: Secondary | ICD-10-CM | POA: Diagnosis not present

## 2021-07-05 LAB — BASIC METABOLIC PANEL
BUN/Creatinine Ratio: 22 (ref 12–28)
BUN: 18 mg/dL (ref 8–27)
CO2: 32 mmol/L — ABNORMAL HIGH (ref 20–29)
Calcium: 10 mg/dL (ref 8.7–10.3)
Chloride: 93 mmol/L — ABNORMAL LOW (ref 96–106)
Creatinine, Ser: 0.83 mg/dL (ref 0.57–1.00)
Glucose: 113 mg/dL — ABNORMAL HIGH (ref 70–99)
Potassium: 4.8 mmol/L (ref 3.5–5.2)
Sodium: 141 mmol/L (ref 134–144)
eGFR: 74 mL/min/{1.73_m2} (ref >59)

## 2021-07-05 NOTE — Patient Instructions (Signed)
Follow-Up: At [facility], you and your health needs are our priority.  As part of our continuing mission to provide you with exceptional heart care, we have created designated Provider Care Teams.  These Care Teams include your primary Cardiologist (physician) and Advanced Practice Providers (APPs -  Physician Assistants and Nurse Practitioners) who all work together to provide you with the care you need, when you need it.  We recommend signing up for the patient portal called "MyChart".  Sign up information is provided on this After Visit Summary.  MyChart is used to connect with patients for Virtual Visits (Telemedicine).  Patients are able to view lab/test results, encounter notes, upcoming appointments, etc.  Non-urgent messages can be sent to your provider as well.   To learn more about what you can do with MyChart, go to [email or web address].    Your next appointment:   6 month(s)  The format for your next appointment:   In Person  Provider:   Minus Breeding, MD

## 2021-07-06 ENCOUNTER — Ambulatory Visit: Payer: Medicare Other | Admitting: Cardiology

## 2021-07-11 ENCOUNTER — Other Ambulatory Visit: Payer: Self-pay

## 2021-07-11 ENCOUNTER — Encounter: Payer: Self-pay | Admitting: Pulmonary Disease

## 2021-07-11 ENCOUNTER — Ambulatory Visit: Payer: Medicare Other | Admitting: Pulmonary Disease

## 2021-07-11 DIAGNOSIS — J432 Centrilobular emphysema: Secondary | ICD-10-CM | POA: Diagnosis not present

## 2021-07-11 DIAGNOSIS — I5032 Chronic diastolic (congestive) heart failure: Secondary | ICD-10-CM | POA: Diagnosis not present

## 2021-07-11 DIAGNOSIS — J9611 Chronic respiratory failure with hypoxia: Secondary | ICD-10-CM

## 2021-07-11 MED ORDER — BREZTRI AEROSPHERE 160-9-4.8 MCG/ACT IN AERO: 2.0000 | INHALATION_SPRAY | Freq: Two times a day (BID) | RESPIRATORY_TRACT | 0 refills | Status: DC

## 2021-07-11 NOTE — Patient Instructions (Signed)
X sample of Breztri - 2 puffs twice daily Call for Rx if this works  X [employer] M/W/F - goal weight should be between 235-240 lbs

## 2021-07-11 NOTE — Assessment & Plan Note (Signed)
I provided her with samples of Breztri which she will try to use instead of Trelegy and call us back for prescription of this works better. She will continue to use albuterol on an as-needed basis

## 2021-07-11 NOTE — Assessment & Plan Note (Signed)
We discussed weight-based dosing of Lasix, goal dry weight should be 235 to 240 pounds
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
## 2021-07-11 NOTE — Assessment & Plan Note (Signed)
Previously noted to require up to 5 L on exertion, hence have been unable to get her POC. She is compliant with oxygen Would benefit from a rehab program, she is hesitant to go to the center-based program

## 2021-07-13 ENCOUNTER — Other Ambulatory Visit: Payer: Self-pay | Admitting: Internal Medicine

## 2021-07-14 DIAGNOSIS — I5033 Acute on chronic diastolic (congestive) heart failure: Secondary | ICD-10-CM | POA: Diagnosis not present

## 2021-07-14 DIAGNOSIS — J449 Chronic obstructive pulmonary disease, unspecified: Secondary | ICD-10-CM | POA: Diagnosis not present

## 2021-07-15 ENCOUNTER — Telehealth: Payer: Self-pay | Admitting: Neurology

## 2021-07-15 NOTE — Telephone Encounter (Signed)
Dr. Leonie Man has called out sick for tomorrow, so I sent the patient a mychart message asking her to call the office tomorrow to get rescheduled.

## 2021-07-16 ENCOUNTER — Ambulatory Visit: Payer: Medicare Other | Admitting: Neurology

## 2021-07-17 ENCOUNTER — Ambulatory Visit (INDEPENDENT_AMBULATORY_CARE_PROVIDER_SITE_OTHER): Payer: Medicare Other

## 2021-07-17 DIAGNOSIS — E785 Hyperlipidemia, unspecified: Secondary | ICD-10-CM

## 2021-07-17 DIAGNOSIS — I5032 Chronic diastolic (congestive) heart failure: Secondary | ICD-10-CM

## 2021-07-17 DIAGNOSIS — J449 Chronic obstructive pulmonary disease, unspecified: Secondary | ICD-10-CM

## 2021-07-17 DIAGNOSIS — E1165 Type 2 diabetes mellitus with hyperglycemia: Secondary | ICD-10-CM

## 2021-07-17 DIAGNOSIS — I10 Essential (primary) hypertension: Secondary | ICD-10-CM

## 2021-07-17 NOTE — Chronic Care Management (AMB) (Signed)
Chronic Care Management   CCM RN Visit Note  07/17/2021 Name: Rebecca Mcintyre MRN: [id number] DOB: [birth year]  Subjective: Rebecca Mcintyre is a 76 y.o. year old female who is a primary care patient of Panosh, Standley Brooking, MD. The care management team was consulted for assistance with disease management and care coordination needs.    Engaged with patient by telephone for follow up visit in response to provider referral for case management and/or care coordination services.   Consent to Services:  The patient was given information about Chronic Care Management services, agreed to services, and gave verbal consent prior to initiation of services.  Please see initial visit note for detailed documentation.   Patient agreed to services and verbal consent obtained.   Assessment: Review of patient past medical history, allergies, medications, health status, including review of consultants reports, laboratory and other test data, was performed as part of comprehensive evaluation and provision of chronic care management services.   SDOH (Social Determinants of Health) assessments and interventions performed:    CCM Care Plan  Allergies  Allergen Reactions   Atorvastatin     REACTION: myalgias  and liver    Outpatient Encounter Medications as of 07/17/2021  Medication Sig   Accu-Chek Softclix Lancets lancets Use as instructed twice a day Diagnosis E11.9   acetaminophen (TYLENOL) 500 MG tablet Take 1,000 mg by mouth every 6 (six) hours as needed for mild pain.   albuterol (PROVENTIL) (2.5 MG/3ML) 0.083% nebulizer solution Take 3 mLs (2.5 mg total) by nebulization every 6 (six) hours as needed for wheezing or shortness of breath.   allopurinol (ZYLOPRIM) 100 MG tablet TAKE 1 TABLET BY MOUTH TWICE A DAY   aspirin 81 MG tablet Take 81 mg by mouth daily.   Blood Glucose Monitoring Suppl (ACCU-CHEK GUIDE) w/Device KIT Use as directed twice a day Diagnosis E11.9   Budeson-Glycopyrrol-Formoterol (BREZTRI  AEROSPHERE) 160-9-4.8 MCG/ACT AERO Inhale 2 puffs into the lungs in the morning and at bedtime.   dapagliflozin propanediol (FARXIGA) 5 MG TABS tablet Take 1 tablet (5 mg total) by mouth daily before breakfast.   furosemide (LASIX) 40 MG tablet TAKE 1 TABLET BY MOUTH EVERY DAY   glucose blood (ACCU-CHEK GUIDE) test strip Use as instructed twice a day Diagnosis E11.9   ketoconazole (NIZORAL) 2 % cream Apply 1 application topically 2 (two) times daily.   KLOR-CON M10 10 MEQ tablet TAKE 1 TABLET BY MOUTH 2 TIMES DAILY.   metFORMIN (GLUCOPHAGE) 500 MG tablet TAKE 1 TABLET BY MOUTH TWICE A DAY WITH A MEAL INCREASE TO 2 TABLETS TWICE A DAY WITH A MEAL   metoprolol tartrate (LOPRESSOR) 25 MG tablet TAKE 1/2 TABLETS BY MOUTH 2 TIMES DAILY   montelukast (SINGULAIR) 10 MG tablet TAKE 1 TABLET (10 MG TOTAL) AT BEDTIME BY MOUTH.   nitroGLYCERIN (NITROSTAT) 0.4 MG SL tablet PLACE 1 TABLET UNDER THE TONGUE EVERY 5 MINUTES AS NEEDED.   PROAIR HFA 108 (90 Base) MCG/ACT inhaler TAKE 2 PUFFS BY MOUTH EVERY 6 HOURS AS NEEDED FOR WHEEZE OR SHORTNESS OF BREATH   rOPINIRole (REQUIP) 0.5 MG tablet TAKE 1 TABLET AT BEDTIME FOR RESTLESS LEG   simvastatin (ZOCOR) 40 MG tablet TAKE 1 TABLET BY MOUTH EVERYDAY AT BEDTIME   TRELEGY ELLIPTA 100-62.5-25 MCG/ACT AEPB TAKE 1 PUFF BY MOUTH EVERY DAY   No facility-administered encounter medications on file as of 07/17/2021.    Patient Active Problem List   Diagnosis Date Noted   Chronic respiratory failure with hypoxia (  [place]) 07/11/2021   Elevated troponin 11/28/2020   Anterior communicating artery aneurysm 11/28/2020   Thyroid nodule 11/28/2020   Contusion of right foot    Educated about COVID-19 virus infection 08/04/2019   COPD (chronic obstructive pulmonary disease) ([place]) 12/29/2018   Restless leg syndrome 12/22/2018   Congestive heart failure ([place]) 12/13/2018   Acute on chronic diastolic CHF (congestive heart failure) ([place]) 11/05/2018   Acute CHF (congestive heart  failure) ([place]) 11/04/2018   CHF (congestive heart failure) ([place]) 11/04/2018   Diabetes mellitus type II, non insulin dependent ([place]) 11/04/2018   Great toe pain, left 01/01/2018   Myocardial infarction [facility])    Elevated hemoglobin ([place]) 04/24/2014   Visit for preventive health examination 04/24/2014   Pre-diabetes 08/12/2012   Decreased hearing 04/12/2012   Welcome to Medicare preventive visit 04/08/2012   HYPERURICEMIA 03/15/2009   GOUT 12/13/2008   OBESITY 08/23/2008   OSTEOPENIA 09/01/2007   MYOCARDIAL INFARCTION, HX OF 07/14/2007   SOLITARY KIDNEY 07/14/2007   Hyperlipidemia 04/15/2007   Essential hypertension 04/15/2007   CAD (coronary artery disease) 04/15/2007   OTHER ABNORMAL GLUCOSE 04/15/2007   TOBACCO ABUSE, HX OF 04/15/2007    Conditions to be addressed/monitored:CHF, HTN, HLD, COPD, and DMII  Care Plan : RN Care Manager Plan of Care  Updates made by Dimitri Ped, RN since 07/17/2021 12:00 AM     Problem: Chronic Disease Management and Care Coordination Needs (COPD,DM2,CHF,HTN)   Priority: High     Long-Range Goal: Establish Plan of Care for Chronic Disease Management Needs (COPD,DM2,CHF,HTN)   Start Date: 04/06/2021  Expected End Date: 10/03/2021  Recent Progress: On track  Priority: High  Note:   Current Barriers:  Care Coordination needs related to Financial constraints related to medication in coverage gap Chronic Disease Management support and education needs related to CHF, HTN, HLD, COPD, and DMII  Financial Constraints   States she is going to start an at home pulmonary rehab through an app from [facility] that Dr. [name] has arranged for her. States she gets short of breath when she walks.  States she wears her O2 at all times.  Denies any chest pains or swelling.  States she weights daily and her weight stays around 240 most of the time.  States she will take a second Lasix if her weight goes up or if she has swelling in her legs as she has been  directed to to do.  States she checks her CBG twice a day with fasting readings of 94-125 and at bedtime 142-184. Denies any low readings. States she is trying to eat less CHO and more protein. States she has been following a low sodium diet more closely.  States she has not been drinking anything with sugar.  States the rash on her arm has healed. RNCM Clinical Goal(s):  Patient will verbalize basic understanding of  CHF, HTN, HLD, COPD, and DMII disease process and self health management plan as evidenced by voiced adherence to plan of care take all medications exactly as prescribed and will call provider for medication related questions as evidenced by dispense report and pt verbalization attend all scheduled medical appointments:  Neurology 08/15/21 as evidenced by medical records demonstrate Improved adherence to prescribed treatment plan for CHF, HTN, HLD, COPD, and DMII as evidenced by readings within limits, adherence to plan of care work with pharmacist to address Financial constraints related to [place] related toCHF, HTN, HLD, COPD, and DMII as evidenced by review or EMR and patient or pharmacist  report through collaboration with RN Care manager, provider, and care team.   Interventions: 1:1 collaboration with primary care provider regarding development and update of comprehensive plan of care as evidenced by provider attestation and co-signature Inter-disciplinary care team collaboration (see longitudinal plan of care) Evaluation of current treatment plan related to  self management and patient's adherence to plan as established by provider   Heart Failure Interventions:  (Status:  Goal on track:  Yes.) Long Term Goal Basic overview and discussion of pathophysiology of Heart Failure reviewed Provided education on low sodium diet Reviewed Heart Failure Action Plan in depth and provided written copy Discussed importance of daily weight and advised patient to weigh and record  daily Reviewed role of diuretics in prevention of fluid overload and management of heart failure; Reviewed dry weight goals of 235-240 per provider and use of extra Lasix as needed  COPD Interventions:  (Status:  Goal on track:  Yes.) Long Term Goal Advised patient to track and manage COPD triggers Provided instruction about proper use of medications used for management of COPD including inhalers Advised patient to self assesses COPD action plan zone and make appointment with provider if in the yellow zone for 48 hours without improvement Advised patient to engage in light exercise as tolerated 3-5 days a week to aid in the the management of COPD Provided education about and advised patient to utilize infection prevention strategies to reduce risk of respiratory infection Discussed the importance of adequate rest and management of fatigue with COPD Reviewed to start virtual pulmonary rehab to help increase her strength and energy. Reviewed to start Breztri inhaler when she has uses up her current Treligy inhaler    Diabetes Interventions:  (Status:  Goal on track:  Yes.) Long Term Goal Assessed patient's understanding of A1c goal: <7% Provided education to patient about basic DM disease process Reviewed medications with patient and discussed importance of medication adherence Counseled on importance of regular laboratory monitoring as prescribed Discussed plans with patient for ongoing care management follow up and provided patient with direct contact information for care management team Advised patient, providing education and rationale, to check cbg twice a day and record, calling provider for findings outside established parameters Referral made to pharmacy team for assistance with cost of [place] with CCM PharmD, currently out of coverage gap Review of patient status, including review of consultants reports, relevant laboratory and other test results, and medications  completed Reviewed fasting blood sugar goals of 80-130 and less than 180 1 1/2-2 hours after meals . Reinforced to follow a low carbohydrate low salt diet and to watch portion sizes Lab Results  Component Value Date   HGBA1C 7.5 (H) 04/10/2021   Hyperlipidemia Interventions:  (Status:  New goal.) Long Term Goal Medication review performed; medication list updated in electronic medical record.  Provider established cholesterol goals reviewed Counseled on importance of regular laboratory monitoring as prescribed Reviewed role and benefits of statin for ASCVD risk reduction Reviewed importance of limiting foods high in cholesterol  Hypertension Interventions:  (Status:  Goal on track:  Yes.) Long Term Goal Last practice recorded BP readings:  BP Readings from Last 3 Encounters:  07/11/21 132/66  07/05/21 108/62  05/22/21 (!) 108/50  Most recent eGFR/CrCl:  Lab Results  Component Value Date   EGFR 74 07/05/2021    No components found for: CRCL  Evaluation of current treatment plan related to hypertension self management and patient's adherence to plan as established by provider Provided education to patient  re: stroke prevention, s/s of heart attack and stroke Discussed plans with patient for ongoing care management follow up and provided patient with direct contact information for care management team Advised patient, providing education and rationale, to monitor blood pressure daily and record, calling PCP for findings outside established parameters Provided education on prescribed diet low sodium low CHO  Discussed complications of poorly controlled blood pressure such as heart disease, stroke, circulatory complications, vision complications, kidney impairment, sexual dysfunction    Health Maintenance Interventions:  (Status:  New goal.) Short Term Goal Historical Immunization for documented 1st Shingrix shot Patient provided CDC guideline information about Shingles vaccine     Reviewed to get 2nd Shingrix shot at least 2 months after first vaccine    Patient Goals/Self-Care Activities: Take all medications as prescribed Attend all scheduled provider appointments Call pharmacy for medication refills 3-7 days in advance of running out of medications Perform all self care activities independently  Call provider office for new concerns or questions  call office if I gain more than 2 pounds in one day or 5 pounds in one week keep legs up while sitting watch for swelling in feet, ankles and legs every day weigh myself daily follow rescue plan if symptoms flare-up eat more whole grains, fruits and vegetables, lean meats and healthy fats track symptoms and what helps feel better or worse dress right for the weather, hot or cold keep appointment with eye doctor check blood sugar at prescribed times: twice daily and when you have symptoms of low or high blood sugar check feet daily for cuts, sores or redness fill half of plate with vegetables manage portion size switch to sugar-free drinks keep feet up while sitting wash and dry feet carefully every day identify and remove indoor air pollutants limit outdoor activity during cold weather do breathing exercises every day attend pulmonary rehabilitation follow rescue plan if symptoms flare-up don't eat or exercise right before bedtime get at least 7 to 8 hours of sleep at night check blood pressure daily choose a place to take my blood pressure (home, clinic or office, retail store) take blood pressure log to all doctor appointments call doctor for signs and symptoms of high blood pressure eat more whole grains, fruits and vegetables, lean meats and healthy fats limit salt intake to 2366m/day call for medicine refill 2 or 3 days before it runs out take all medications exactly as prescribed call doctor with any symptoms you believe are related to your medicine  Follow Up Plan:  Telephone follow up appointment  with care management team member scheduled for:  09/11/21 The patient has been provided with contact information for the care management team and has been advised to call with any health related questions or concerns.          Plan:Telephone follow up appointment with care management team member scheduled for:  09/11/21 The patient has been provided with contact information for the care management team and has been advised to call with any health related questions or concerns.  MPeter GarterRN, BJackquline [place] CDE Care Management Coordinator [facility] ([phone]

## 2021-07-17 NOTE — Patient Instructions (Signed)
Visit Information  Thank you for taking time to visit with me today. Please don't hesitate to contact me if I can be of assistance to you before our next scheduled telephone appointment.  Following are the goals we discussed today:  Take all medications as prescribed Attend all scheduled provider appointments Call pharmacy for medication refills 3-7 days in advance of running out of medications Perform all self care activities independently  Call provider office for new concerns or questions  call office if I gain more than 2 pounds in one day or 5 pounds in one week keep legs up while sitting watch for swelling in feet, ankles and legs every day weigh myself daily follow rescue plan if symptoms flare-up eat more whole grains, fruits and vegetables, lean meats and healthy fats track symptoms and what helps feel better or worse dress right for the weather, hot or cold keep appointment with eye doctor check blood sugar at prescribed times: twice daily and when you have symptoms of low or high blood sugar check feet daily for cuts, sores or redness fill half of plate with vegetables manage portion size switch to sugar-free drinks keep feet up while sitting wash and dry feet carefully every day identify and remove indoor air pollutants limit outdoor activity during cold weather do breathing exercises every day attend pulmonary rehabilitation follow rescue plan if symptoms flare-up don't eat or exercise right before bedtime get at least 7 to 8 hours of sleep at night check blood pressure daily choose a place to take my blood pressure (home, clinic or office, retail store) take blood pressure log to all doctor appointments call doctor for signs and symptoms of high blood pressure eat more whole grains, fruits and vegetables, lean meats and healthy fats limit salt intake to 2325m/day call for medicine refill 2 or 3 days before it runs out take all medications exactly as  prescribed call doctor with any symptoms you believe are related to your medicine Our next appointment is by telephone on 09/11/21 at 10 AM  Please call the care guide team at [phone] you need to cancel or reschedule your appointment.   If you are experiencing a Mental Health or [place] need someone to talk to, please call the Suicide and Crisis Lifeline: 988 call the [place] Suicide Prevention Lifeline: 1305-300-0601or TTY: 1564-560-7963TTY (701 170 1177 to talk to a trained counselor call 1-800-273-TALK (toll free, 24 hour hotline) go to [facility] [address] [phone] call 911   Patient verbalizes understanding of instructions and care plan provided today and agrees to view in [facility] Active MyChart status confirmed with patient.   MPeter GarterRN, BJackquline [place] CDE Care Management Coordinator [facility] [phone]

## 2021-07-23 ENCOUNTER — Other Ambulatory Visit: Payer: Medicare Other

## 2021-07-24 DIAGNOSIS — J449 Chronic obstructive pulmonary disease, unspecified: Secondary | ICD-10-CM

## 2021-07-24 DIAGNOSIS — I5032 Chronic diastolic (congestive) heart failure: Secondary | ICD-10-CM

## 2021-07-24 DIAGNOSIS — E1165 Type 2 diabetes mellitus with hyperglycemia: Secondary | ICD-10-CM

## 2021-07-24 DIAGNOSIS — I10 Essential (primary) hypertension: Secondary | ICD-10-CM

## 2021-07-24 DIAGNOSIS — E785 Hyperlipidemia, unspecified: Secondary | ICD-10-CM

## 2021-08-03 ENCOUNTER — Encounter: Payer: Self-pay | Admitting: Pulmonary Disease

## 2021-08-03 ENCOUNTER — Other Ambulatory Visit: Payer: Self-pay

## 2021-08-03 MED ORDER — BREZTRI AEROSPHERE 160-9-4.8 MCG/ACT IN AERO: 2.0000 | INHALATION_SPRAY | Freq: Two times a day (BID) | RESPIRATORY_TRACT | 5 refills | Status: DC

## 2021-08-03 NOTE — Telephone Encounter (Signed)
Received the following message from patient:  ? ?"At my last visit you gave me a sample of Breztri to try. I think I want to continue using it if you will send over a prescription to [facility] on [address].  Thanks so much" ? ?Dr. [name], please advise if you are ok with [place] ordering the [facility]. Thanks!  ?

## 2021-08-11 DIAGNOSIS — J449 Chronic obstructive pulmonary disease, unspecified: Secondary | ICD-10-CM | POA: Diagnosis not present

## 2021-08-11 DIAGNOSIS — I5033 Acute on chronic diastolic (congestive) heart failure: Secondary | ICD-10-CM | POA: Diagnosis not present

## 2021-08-15 ENCOUNTER — Encounter: Payer: Self-pay | Admitting: Neurology

## 2021-08-15 ENCOUNTER — Ambulatory Visit: Payer: Medicare Other | Admitting: Neurology

## 2021-08-15 VITALS — BP 110/64 | HR 93

## 2021-08-15 DIAGNOSIS — I671 Cerebral aneurysm, nonruptured: Secondary | ICD-10-CM
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
## 2021-08-15 NOTE — Patient Instructions (Signed)
I had a long discussion with the patient and her daughter regarding her asymptomatic small anterior communicating artery aneurysm and discussed the risk of rupture, need for surveillance and the risk benefit of treatment and answered questions.  Follow-up CT angiogram done in February 2023 shows stable appearance of the aneurysm and at the present time given lack of any significant high risk features patient prefers to have conservative follow-up.  I offered referral for endovascular aneurysm treatment but patient refused.  She will return for follow-up in a year and will check follow-up surveillance CT angiogram at that time. ?

## 2021-08-21 ENCOUNTER — Other Ambulatory Visit: Payer: Self-pay | Admitting: Internal Medicine

## 2021-08-22 ENCOUNTER — Other Ambulatory Visit: Payer: Self-pay | Admitting: Pulmonary Disease

## 2021-08-22 ENCOUNTER — Other Ambulatory Visit: Payer: Self-pay | Admitting: Internal Medicine

## 2021-08-22 NOTE — Telephone Encounter (Signed)
Dr. [name], please advise on refill request. ?

## 2021-08-25 ENCOUNTER — Other Ambulatory Visit: Payer: Self-pay | Admitting: Pulmonary Disease

## 2021-09-06 ENCOUNTER — Encounter: Payer: Self-pay | Admitting: Pulmonary Disease

## 2021-09-11 ENCOUNTER — Ambulatory Visit (INDEPENDENT_AMBULATORY_CARE_PROVIDER_SITE_OTHER): Payer: Medicare Other

## 2021-09-11 ENCOUNTER — Other Ambulatory Visit: Payer: Self-pay | Admitting: Internal Medicine

## 2021-09-11 DIAGNOSIS — E785 Hyperlipidemia, unspecified: Secondary | ICD-10-CM

## 2021-09-11 DIAGNOSIS — I5032 Chronic diastolic (congestive) heart failure: Secondary | ICD-10-CM

## 2021-09-11 DIAGNOSIS — J449 Chronic obstructive pulmonary disease, unspecified: Secondary | ICD-10-CM | POA: Diagnosis not present

## 2021-09-11 DIAGNOSIS — I10 Essential (primary) hypertension: Secondary | ICD-10-CM

## 2021-09-11 DIAGNOSIS — E1165 Type 2 diabetes mellitus with hyperglycemia: Secondary | ICD-10-CM

## 2021-09-11 DIAGNOSIS — I5033 Acute on chronic diastolic (congestive) heart failure: Secondary | ICD-10-CM | POA: Diagnosis not present

## 2021-09-11 NOTE — Patient Instructions (Signed)
Visit Information ? ?Thank you for taking time to visit with me today. Please don't hesitate to contact me if I can be of assistance to you before our next scheduled telephone appointment. ? ?Following are the goals we discussed today:  ?Take all medications as prescribed ?Attend all scheduled provider appointments ?Call pharmacy for medication refills 3-7 days in advance of running out of medications ?Perform all self care activities independently  ?Call provider office for new concerns or questions  ?call office if I gain more than 2 pounds in one day or 5 pounds in one week ?keep legs up while sitting ?watch for swelling in feet, ankles and legs every day ?weigh myself daily ?follow rescue plan if symptoms flare-up ?eat more whole grains, fruits and vegetables, lean meats and healthy fats ?track symptoms and what helps feel better or worse ?dress right for the weather, hot or cold ?keep appointment with eye doctor ?check blood sugar at prescribed times: twice daily and when you have symptoms of low or high blood sugar ?check feet daily for cuts, sores or redness ?fill half of plate with vegetables ?manage portion size ?switch to sugar-free drinks ?keep feet up while sitting ?wash and dry feet carefully every day ?identify and remove indoor air pollutants ?limit outdoor activity during cold weather ?do breathing exercises every day ?attend pulmonary rehabilitation ?follow rescue plan if symptoms flare-up ?don't eat or exercise right before bedtime ?get at least 7 to 8 hours of sleep at night ?check blood pressure daily ?choose a place to take my blood pressure (home, clinic or office, retail store) ?take blood pressure log to all doctor appointments ?call doctor for signs and symptoms of high blood pressure ?eat more whole grains, fruits and vegetables, lean meats and healthy fats ?limit salt intake to 2334m/day ?call for medicine refill 2 or 3 days before it runs out ?take all medications exactly as  prescribed ?call doctor with any symptoms you believe are related to your medicine ? ?Our next appointment is by telephone on 10/30/21 at 10:45 AM ? ?Please call the care guide team at [phone] you need to cancel or reschedule your appointment.  ? ?If you are experiencing a Mental Health or [place] need someone to talk to, please call the Suicide and Crisis Lifeline: 988 ?call the [employer] Suicide Prevention Lifeline: 1773-192-8430or TTY: 1760-212-4028TTY ([phone] to talk to a trained counselor ?call 1-800-273-TALK (toll free, 24 hour hotline) ?go to [facility] [address] [place]([phone] ?call 911  ? ?Patient verbalizes understanding of instructions and care plan provided today and agrees to view in [facility] Active MyChart status confirmed with patient.   ? ?MPeter GarterRN, BSN,CCM, CDE ?Care Management Coordinator ?[facility] ?[phone]  ?

## 2021-09-11 NOTE — Chronic Care Management (AMB) (Signed)
?Chronic Care Management  ? ?CCM RN Visit Note ? ?09/11/2021 ?Name: Rebecca Mcintyre MRN: [id number] DOB: April 06, 1946 ? ?Subjective: ?Rebecca Mcintyre is a 76 y.o. year old female who is a primary care patient of Panosh, Standley Brooking, MD. The care management team was consulted for assistance with disease management and care coordination needs.   ? ?Engaged with patient by telephone for follow up visit in response to provider referral for case management and/or care coordination services.  ? ?Consent to Services:  ?The patient was given information about Chronic Care Management services, agreed to services, and gave verbal consent prior to initiation of services.  Please see initial visit note for detailed documentation.  ? ?Patient agreed to services and verbal consent obtained.  ? ?Assessment: Review of patient past medical history, allergies, medications, health status, including review of consultants reports, laboratory and other test data, was performed as part of comprehensive evaluation and provision of chronic care management services.  ? ?SDOH (Social Determinants of Health) assessments and interventions performed:   ? ?CCM Care Plan ? ?Allergies  ?Allergen Reactions  ? Atorvastatin   ?  REACTION: myalgias  and liver  ? ? ?Outpatient Encounter Medications as of 09/11/2021  ?Medication Sig  ? Accu-Chek Softclix Lancets lancets Use as instructed twice a day Diagnosis E11.9  ? acetaminophen (TYLENOL) 500 MG tablet Take 1,000 mg by mouth every 6 (six) hours as needed for mild pain.  ? albuterol (PROVENTIL) (2.5 MG/3ML) 0.083% nebulizer solution Take 3 mLs (2.5 mg total) by nebulization every 6 (six) hours as needed for wheezing or shortness of breath.  ? albuterol (VENTOLIN HFA) 108 (90 Base) MCG/ACT inhaler USE 2 PUFFS EVERY 6 HOURS AS NEEDED FOR WHEEZE OR SHORTNESS OF BREATH  ? allopurinol (ZYLOPRIM) 100 MG tablet TAKE 1 TABLET BY MOUTH TWICE A DAY  ? aspirin 81 MG tablet Take 81 mg by mouth daily.  ? Blood Glucose  Monitoring Suppl (ACCU-CHEK GUIDE) w/Device KIT Use as directed twice a day Diagnosis E11.9  ? Budeson-Glycopyrrol-Formoterol (BREZTRI AEROSPHERE) 160-9-4.8 MCG/ACT AERO Inhale 2 puffs into the lungs in the morning and at bedtime.  ? Budeson-Glycopyrrol-Formoterol (BREZTRI AEROSPHERE) 160-9-4.8 MCG/ACT AERO Inhale 2 puffs into the lungs in the morning and at bedtime.  ? dapagliflozin propanediol (FARXIGA) 5 MG TABS tablet Take 1 tablet (5 mg total) by mouth daily before breakfast.  ? furosemide (LASIX) 40 MG tablet TAKE 1 TABLET BY MOUTH EVERY DAY  ? glucose blood (ACCU-CHEK GUIDE) test strip USE AS DIRECTED TWICE A DAY  ? ketoconazole (NIZORAL) 2 % cream Apply 1 application topically 2 (two) times daily.  ? KLOR-CON M10 10 MEQ tablet TAKE 1 TABLET BY MOUTH 2 TIMES DAILY.  ? metFORMIN (GLUCOPHAGE) 500 MG tablet TAKE 1 TABLET BY MOUTH TWICE A DAY WITH A MEAL INCREASE TO 2 TABLETS TWICE A DAY WITH A MEAL  ? metoprolol tartrate (LOPRESSOR) 25 MG tablet TAKE 1/2 TABLETS BY MOUTH 2 TIMES DAILY  ? montelukast (SINGULAIR) 10 MG tablet TAKE 1 TABLET (10 MG TOTAL) AT BEDTIME BY MOUTH.  ? nitroGLYCERIN (NITROSTAT) 0.4 MG SL tablet PLACE 1 TABLET UNDER THE TONGUE EVERY 5 MINUTES AS NEEDED.  ? rOPINIRole (REQUIP) 0.5 MG tablet TAKE 1 TABLET AT BEDTIME FOR RESTLESS LEG  ? simvastatin (ZOCOR) 40 MG tablet TAKE 1 TABLET BY MOUTH EVERYDAY AT BEDTIME  ? ?No facility-administered encounter medications on file as of 09/11/2021.  ? ? ?Patient Active Problem List  ? Diagnosis Date Noted  ? Chronic respiratory  failure with hypoxia ([place]) 07/11/2021  ? Elevated troponin 11/28/2020  ? Anterior communicating artery aneurysm 11/28/2020  ? Thyroid nodule 11/28/2020  ? Contusion of right foot   ? Educated about COVID-19 virus infection 08/04/2019  ? COPD (chronic obstructive pulmonary disease) ([place]) 12/29/2018  ? Restless leg syndrome 12/22/2018  ? Congestive heart failure ([place]) 12/13/2018  ? Acute on chronic diastolic CHF (congestive heart  failure) ([place]) 11/05/2018  ? Acute CHF (congestive heart failure) ([place]) 11/04/2018  ? CHF (congestive heart failure) ([place]) 11/04/2018  ? Diabetes mellitus type II, non insulin dependent ([place]) 11/04/2018  ? Great toe pain, left 01/01/2018  ? Myocardial infarction [facility])   ? Elevated hemoglobin ([place]) 04/24/2014  ? Visit for preventive health examination 04/24/2014  ? Pre-diabetes 08/12/2012  ? Decreased hearing 04/12/2012  ? Welcome to Medicare preventive visit 04/08/2012  ? HYPERURICEMIA 03/15/2009  ? GOUT 12/13/2008  ? OBESITY 08/23/2008  ? OSTEOPENIA 09/01/2007  ? MYOCARDIAL INFARCTION, HX OF 07/14/2007  ? SOLITARY KIDNEY 07/14/2007  ? Hyperlipidemia 04/15/2007  ? Essential hypertension 04/15/2007  ? CAD (coronary artery disease) 04/15/2007  ? OTHER ABNORMAL GLUCOSE 04/15/2007  ? TOBACCO ABUSE, HX OF 04/15/2007  ? ? ?Conditions to be addressed/monitored:CHF, HTN, HLD, COPD, and DMII ? ?Care Plan : RN Care Manager Plan of Care  ?Updates made by Dimitri Ped, RN since 09/11/2021 12:00 AM  ?  ? ?Problem: Chronic Disease Management and Care Coordination Needs (COPD,DM2,CHF,HTN)   ?Priority: High  ?  ? ?Long-Range Goal: Establish Plan of Care for Chronic Disease Management Needs (COPD,DM2,CHF,HTN)   ?Start Date: 04/06/2021  ?Expected End Date: 09/11/2022  ?Recent Progress: On track  ?Priority: High  ?Note:   ?Current Barriers:  ?Care Coordination needs related to Financial constraints related to medication in coverage gap ?Chronic Disease Management support and education needs related to CHF, HTN, HLD, COPD, and DMII  ?Financial Constraints  ? States she is doing the home pulmonary rehab through an app from [facility] that Dr. [name] has arranged for her.  States she is walking twice a day for 5 minutes and is riding her exercise bike.  States he is doing breathing exercises. States she gets short of breath when she walks.  States she wears her O2 at all times.  Denies any chest pains or swelling.  States she weights  daily and her weight stays around 240-244 most of the time.  States she will take a second Lasix if her weight goes up or if she has swelling in her legs as she has been directed to to do.  States she checks her CBG twice a day with fasting readings of 90-125 and at bedtime 130-142. Denies any low readings. States she is still trying to eat less CHO and more protein. States she has been following a low sodium diet more closely.  States she has not been drinking anything with sugar.   ?RNCM Clinical Goal(s):  ?Patient will verbalize basic understanding of  CHF, HTN, HLD, COPD, and DMII disease process and self health management plan as evidenced by voiced adherence to plan of care ?take all medications exactly as prescribed and will call provider for medication related questions as evidenced by dispense report and pt verbalization ?attend all scheduled medical appointments:  Neurology 08/26/22 as evidenced by medical records ?demonstrate Improved adherence to prescribed treatment plan for CHF, HTN, HLD, COPD, and DMII as evidenced by readings within limits, adherence to plan of care ?work with pharmacist to address Financial constraints related to  Farxiga related toCHF, HTN, HLD, COPD, and DMII as evidenced by review or EMR and patient or pharmacist report through collaboration with RN Care manager, provider, and care team.  ? ?Interventions: ?1:1 collaboration with primary care provider regarding development and update of comprehensive plan of care as evidenced by provider attestation and co-signature ?Inter-disciplinary care team collaboration (see longitudinal plan of care) ?Evaluation of current treatment plan related to  self management and patient's adherence to plan as established by provider ? ? ?Heart Failure Interventions:  (Status:  Goal on track:  Yes.) Long Term Goal ?Basic overview and discussion of pathophysiology of Heart Failure reviewed ?Provided education on low sodium diet ?Reviewed Heart Failure  Action Plan in depth and provided written copy ?Discussed importance of daily weight and advised patient to weigh and record daily ?Reviewed role of diuretics in prevention of fluid overload and management of heart

## 2021-09-20 ENCOUNTER — Encounter: Payer: Self-pay | Admitting: Pulmonary Disease

## 2021-09-21 NOTE — Telephone Encounter (Signed)
Ronney Asters, do you still have the pt's assistance forms for Breztri by chance? ?

## 2021-09-23 ENCOUNTER — Other Ambulatory Visit: Payer: Self-pay | Admitting: Internal Medicine

## 2021-09-23 DIAGNOSIS — I5032 Chronic diastolic (congestive) heart failure: Secondary | ICD-10-CM | POA: Diagnosis not present

## 2021-09-23 DIAGNOSIS — J449 Chronic obstructive pulmonary disease, unspecified: Secondary | ICD-10-CM | POA: Diagnosis not present

## 2021-09-23 DIAGNOSIS — E1165 Type 2 diabetes mellitus with hyperglycemia: Secondary | ICD-10-CM

## 2021-09-23 DIAGNOSIS — E785 Hyperlipidemia, unspecified: Secondary | ICD-10-CM | POA: Diagnosis not present

## 2021-09-23 DIAGNOSIS — I10 Essential (primary) hypertension: Secondary | ICD-10-CM | POA: Diagnosis not present

## 2021-09-25 ENCOUNTER — Encounter: Payer: Self-pay | Admitting: Pulmonary Disease

## 2021-09-25 ENCOUNTER — Other Ambulatory Visit: Payer: Self-pay

## 2021-09-25 MED ORDER — BREZTRI AEROSPHERE 160-9-4.8 MCG/ACT IN AERO: 2.0000 | INHALATION_SPRAY | Freq: Two times a day (BID) | RESPIRATORY_TRACT | 11 refills | Status: DC

## 2021-09-25 NOTE — Progress Notes (Signed)
Mychart message received from pt about Breztri pt assistance application. ? ?Meagan, can you please print out Breztri AZ&ME application and fill out and have Dr. [name] sign the prescriber form. The prescriber portion should be the only part that needs to be taken care of with the application as it seems like pt has already taken care of her portion. ?

## 2021-10-01 ENCOUNTER — Encounter: Payer: Self-pay | Admitting: Pulmonary Disease

## 2021-10-02 ENCOUNTER — Telehealth: Payer: Self-pay | Admitting: Pharmacist

## 2021-10-02 NOTE — Chronic Care Management (AMB) (Signed)
Chronic Care Management Pharmacy Assistant   Name: Rebecca Mcintyre  MRN: [id number] DOB: Sep 26, 1945  Reason for Encounter: Disease State / Hypertension Assessment Call   Conditions to be addressed/monitored: HTN  Recent office visits:  None  Recent consult visits:  08/15/2021 Antony Contras MD (neurology) - Patient was seen for brain aneurysm. Discontinued Trelegy Ellipta. Follow up in 1 year.   07/11/2021 [name] (pulmonary) - Patient was seen for Centrilobular emphysema and additional issues. Started Breztri 160-9-4.8 mcg/act 2 puffs twice daily. Follow up in 4 months.   07/05/2021 Minus Breeding MD (cardiology) - Patient was seen for Chronic diastolic HF and additional issues. Decreased Dapagliflozin to 5 mg daily. Discontinued Doxycycline, Prednisone and Valacyclovir. Follow up in 6 months.   06/08/2021 Izora Gala MD (ENT) - Patient was seen for Presbycusis of both ears and an additional issue. Started Floxin 0.3% otic solution Place 5 drops into the right ear 2 times daily for 3 days. No follow up noted.   06/05/2021 [place] (audiology) - Patient was seen for Sensorineural hearing loss, bilateral, and was here to pick up her replacement left Oticon More 3-R miniRITE hearing aid. No medication changes. No follow up noted.   Hospital visits:  None  Medications: Outpatient Encounter Medications as of 10/02/2021  Medication Sig   Accu-Chek Softclix Lancets lancets Use as instructed twice a day Diagnosis E11.9   acetaminophen (TYLENOL) 500 MG tablet Take 1,000 mg by mouth every 6 (six) hours as needed for mild pain.   albuterol (PROVENTIL) (2.5 MG/3ML) 0.083% nebulizer solution Take 3 mLs (2.5 mg total) by nebulization every 6 (six) hours as needed for wheezing or shortness of breath.   albuterol (VENTOLIN HFA) 108 (90 Base) MCG/ACT inhaler USE 2 PUFFS EVERY 6 HOURS AS NEEDED FOR WHEEZE OR SHORTNESS OF BREATH   allopurinol (ZYLOPRIM) 100 MG tablet TAKE 1 TABLET  BY MOUTH TWICE A DAY   aspirin 81 MG tablet Take 81 mg by mouth daily.   Blood Glucose Monitoring Suppl (ACCU-CHEK GUIDE) w/Device KIT Use as directed twice a day Diagnosis E11.9   Budeson-Glycopyrrol-Formoterol (BREZTRI AEROSPHERE) 160-9-4.8 MCG/ACT AERO Inhale 2 puffs into the lungs in the morning and at bedtime.   Budeson-Glycopyrrol-Formoterol (BREZTRI AEROSPHERE) 160-9-4.8 MCG/ACT AERO Inhale 2 puffs into the lungs in the morning and at bedtime.   dapagliflozin propanediol (FARXIGA) 5 MG TABS tablet Take 1 tablet (5 mg total) by mouth daily before breakfast.   furosemide (LASIX) 40 MG tablet TAKE 1 TABLET BY MOUTH EVERY DAY   glucose blood (ACCU-CHEK GUIDE) test strip USE AS DIRECTED TWICE A DAY   ketoconazole (NIZORAL) 2 % cream Apply 1 application topically 2 (two) times daily.   KLOR-CON M10 10 MEQ tablet TAKE 1 TABLET BY MOUTH TWICE A DAY   metFORMIN (GLUCOPHAGE) 500 MG tablet TAKE 1 TABLET BY MOUTH TWICE A DAY WITH A MEAL INCREASE TO 2 TABLETS TWICE A DAY WITH A MEAL   metoprolol tartrate (LOPRESSOR) 25 MG tablet TAKE 1/2 TABLETS BY MOUTH 2 TIMES DAILY   montelukast (SINGULAIR) 10 MG tablet TAKE 1 TABLET (10 MG TOTAL) AT BEDTIME BY MOUTH.   nitroGLYCERIN (NITROSTAT) 0.4 MG SL tablet PLACE 1 TABLET UNDER THE TONGUE EVERY 5 MINUTES AS NEEDED.   rOPINIRole (REQUIP) 0.5 MG tablet TAKE 1 TABLET AT BEDTIME FOR RESTLESS LEG   simvastatin (ZOCOR) 40 MG tablet TAKE 1 TABLET BY MOUTH EVERYDAY AT BEDTIME   No facility-administered encounter medications on file as of 10/02/2021.  Fill History: ALBUTEROL HFA 90 MCG INHALER 09/26/2021 25   ALLOPURINOL 100 MG TABLET 07/13/2021 90   BREZTRI AEROSPHERE INHALER 08/06/2021 30   FARXIGA 10 MG TABLET 09/20/2021 30   FUROSEMIDE 40 MG TABLET 09/25/2021 90   METOPROLOL TARTRATE 25 MG TAB 09/20/2021 90   KLOR-CON M10 TABLET 09/25/2021 30   SIMVASTATIN 40 MG TABLET 09/24/2021 90   METFORMIN HCL 500 MG TABLET 08/22/2021 90   ROPINIROLE HCL 0.5 MG  TABLET 08/22/2021 90   Reviewed chart prior to disease state call. Spoke with patient regarding BP  Recent Office Vitals: BP Readings from Last 3 Encounters:  08/15/21 110/64  07/11/21 132/66  07/05/21 108/62   Pulse Readings from Last 3 Encounters:  08/15/21 93  07/11/21 92  07/05/21 87    Wt Readings from Last 3 Encounters:  07/11/21 241 lb 12.8 oz (109.7 kg)  07/05/21 242 lb (109.8 kg)  05/22/21 246 lb 12.8 oz (111.9 kg)     Kidney Function Lab Results  Component Value Date/Time   CREATININE 0.83 07/05/2021 11:07 AM   CREATININE 0.90 04/10/2021 10:25 AM   GFR 62.97 04/10/2021 10:25 AM   GFRNONAA >60 12/01/2020 03:35 AM   GFRAA >60 11/12/2018 09:54 AM       Latest Ref Rng & Units 07/05/2021   11:07 AM 04/10/2021   10:25 AM 12/25/2020   11:38 AM  BMP  Glucose 70 - 99 mg/dL 113   103   123    BUN 8 - 27 mg/dL _0 Creatinine 0.57 - 1.00 mg/dL 0.83   0.90   0.96    BUN/Creat Ratio 12 - 28 22      Sodium 134 - 144 mmol/L 141   140   141    Potassium 3.5 - 5.2 mmol/L 4.8   4.0   3.8    Chloride 96 - 106 mmol/L 93   94   99    CO2 20 - 29 mmol/L 32   37   31    Calcium 8.7 - 10.3 mg/dL 10.0   9.8   9.6      Current antihypertensive regimen:  Furosemide 40 mg 1 tablet daily Metoprolol 25 mg 0.5 tablet by mouth twice daily  How often are you checking your Blood Pressure?   Current home BP readings:   What recent interventions/DTPs have been made by any provider to improve Blood Pressure control since last CPP Visit: No recent interventions.   Any recent hospitalizations or ED visits since last visit with CPP? No recent hospital visits.   What diet changes have been made to improve Blood Pressure Control?  Patient follows Breakfast - patient will have Lunch - patient will have Dinner - patient will have  What exercise is being done to improve your Blood Pressure Control?    Adherence Review: Is the patient currently on ACE/ARB medication?  Yes Does the patient have >5 day gap between last estimated fill dates? No  Unable to reach patient after several attempts  Care Gaps: AWV - completed 03/30/2021 Tetanus/TDAP - overdue since 2019 Malb - overdue Foot exam - overdue Last A1C - 7.5 on 04/10/21 Last BP - 108/50 on 05/22/2021   Star Rating Drug: Metformin 562m - last filled on 08/22/2021 [facility] Simvastatin 457m- last filled on 09/24/2021 [facility] Dapagliflozin 1060m [id number]

## 2021-10-02 NOTE — Telephone Encounter (Signed)
She reports that she feels short of breath with exertion, and nasal congestion but denies any other symptoms at this time such as fever, body aches,.  ? ?She has taken OTC for congestion and she is not sure that it is working. She wants to know what you recommend. She is taking her inhalers as prescribed.  ?

## 2021-10-03 ENCOUNTER — Ambulatory Visit: Payer: Medicare Other | Admitting: Pulmonary Disease

## 2021-10-03 ENCOUNTER — Encounter: Payer: Self-pay | Admitting: Pulmonary Disease

## 2021-10-03 VITALS — BP 130/60 | HR 92 | Ht 63.0 in | Wt 240.8 lb

## 2021-10-03 DIAGNOSIS — J441 Chronic obstructive pulmonary disease with (acute) exacerbation: Secondary | ICD-10-CM

## 2021-10-03 DIAGNOSIS — I5032 Chronic diastolic (congestive) heart failure: Secondary | ICD-10-CM

## 2021-10-03 DIAGNOSIS — J432 Centrilobular emphysema: Secondary | ICD-10-CM

## 2021-10-03 LAB — COMPREHENSIVE METABOLIC PANEL
ALT: 17 U/L (ref 0–35)
AST: 21 U/L (ref 0–37)
Albumin: 4.3 g/dL (ref 3.5–5.2)
Alkaline Phosphatase: 80 U/L (ref 39–117)
BUN: 29 mg/dL — ABNORMAL HIGH (ref 6–23)
CO2: 41 mEq/L — ABNORMAL HIGH (ref 19–32)
Calcium: 10 mg/dL (ref 8.4–10.5)
Chloride: 91 mEq/L — ABNORMAL LOW (ref 96–112)
Creatinine, Ser: 1.04 mg/dL (ref 0.40–1.20)
GFR: 52.76 mL/min — ABNORMAL LOW (ref >60.00)
Glucose, Bld: 106 mg/dL — ABNORMAL HIGH (ref 70–99)
Potassium: 4.2 mEq/L (ref 3.5–5.1)
Sodium: 141 mEq/L (ref 135–145)
Total Bilirubin: 0.5 mg/dL (ref 0.2–1.2)
Total Protein: 7.8 g/dL (ref 6.0–8.3)

## 2021-10-03 LAB — CBC WITH DIFFERENTIAL/PLATELET
Basophils Absolute: 0.1 10*3/uL (ref 0.0–0.1)
Basophils Relative: 0.6 % (ref 0.0–3.0)
Eosinophils Absolute: 0.2 10*3/uL (ref 0.0–0.7)
Eosinophils Relative: 2.9 % (ref 0.0–5.0)
HCT: 47.2 % — ABNORMAL HIGH (ref 36.0–46.0)
Hemoglobin: 15.3 g/dL — ABNORMAL HIGH (ref 12.0–15.0)
Lymphocytes Relative: 36 % (ref 12.0–46.0)
Lymphs Abs: 2.9 10*3/uL (ref 0.7–4.0)
MCHC: 32.4 g/dL (ref 30.0–36.0)
MCV: 93.1 fl (ref 78.0–100.0)
Monocytes Absolute: 0.8 10*3/uL (ref 0.1–1.0)
Monocytes Relative: 10.4 % (ref 3.0–12.0)
Neutro Abs: 4.1 10*3/uL (ref 1.4–7.7)
Neutrophils Relative %: 50.1 % (ref 43.0–77.0)
Platelets: 222 10*3/uL (ref 150.0–400.0)
RBC: 5.07 Mil/uL (ref 3.87–5.11)
RDW: 15 % (ref 11.5–15.5)
WBC: 8.1 10*3/uL (ref 4.0–10.5)

## 2021-10-03 LAB — MAGNESIUM: Magnesium: 2 mg/dL (ref 1.5–2.5)

## 2021-10-03 MED ORDER — BREZTRI AEROSPHERE 160-9-4.8 MCG/ACT IN AERO
2.0000 | INHALATION_SPRAY | Freq: Two times a day (BID) | RESPIRATORY_TRACT | 0 refills | Status: DC
Start: 2021-10-03 — End: 2022-01-20

## 2021-10-03 MED ORDER — SPACER/AERO-HOLD CHAMBER BAGS MISC: 0 refills | Status: DC

## 2021-10-03 MED ORDER — METHYLPREDNISOLONE ACETATE 80 MG/ML IJ SUSP
80.0000 mg | Freq: Once | INTRAMUSCULAR | Status: AC
  Administered 2021-10-03: 80 mg via INTRAMUSCULAR

## 2021-10-03 MED ORDER — PREDNISONE 10 MG PO TABS: ORAL_TABLET | ORAL | 0 refills | Status: DC

## 2021-10-03 NOTE — Patient Instructions (Addendum)
?  X blood work today - CBC/diff, CMET, magnesium ? ?X Sample of Breztri ?X Spacer x 1 ? ?X solumedrol IM 80 mg ?Prednisone 10 mg tabs  Take 2 tabs daily with food x 5ds, then 1 tab daily with food x 5ds then STOP ? ? ? ?

## 2021-10-03 NOTE — Telephone Encounter (Signed)
Called and spoke with patient to see if she can come in today to see Dr. [name]. She said yes. Patient has been scheduled. Nothing further needed at this time. ?Next Appt ?With Pulmonology Davonna Belling V. [name], MD) ?10/03/2021 at 10:45 AM ? ?

## 2021-10-03 NOTE — Assessment & Plan Note (Signed)
We will treat as exacerbation, noninfective.  Not able to pin down the cause of this flare.  May simply be related to the fact that she has not used her Breztri in the last 2 weeks.  Her application is pending, she is in the donut hole we will provide her with a sample today. ?If her application is not  approved, we will  revert back to Symbicort and Spiriva for the rest of the year. ?Solu-Medrol 80 mg IM will be given today and short course of prednisone provided. ? ?

## 2021-10-03 NOTE — Assessment & Plan Note (Signed)
She does not appear to be in overt heart failure today. ?In fact she may be overdiuresis and we will check labs to ensure that she is not hypokalemic or hypomagnesemic ?

## 2021-10-03 NOTE — Progress Notes (Signed)
? ?  Subjective:  ? ? Patient ID: Rebecca Mcintyre, female    DOB: 1945/11/10, 76 y.o.   MRN: [id number] ? ?HPI ? ?76 yo ex-smoker for follow-up of COPD , chronic hypoxic respiratory failure and pulmonary hypertension ?Identical twin of Rebecca Mcintyre ,my patient who passed away ?  ?PMH - CAD, diastolic HF,  dyslipidemia, obesity  ?- hospitalized 10/2018 for acute on chronic hypercarbic respiratory failure ?-slow to resolve COPD exacerbation 01/2020 ?- hospitalized 11/2020  for right lower extremity injury, course complicated by acute diastolic heart failure and encephalopathy due to narcotics elevated troponin  ?- ?Chief Complaint  ?Patient presents with  ? Acute Visit  ?  DOE since last Thursday   ? ?She called complaining of feeling weak and increased dyspnea on exertion for 1 week. ?Denies URI symptoms, cough or sputum production, reports intermittent wheezing. ?Turns out that she ran out of [place] and has not been taking this for the last 3 weeks has only been using albuterol as needed. ?She has been compliant with Lasix and in fact has doubled up on 3 of the last 6 days, weight is steady at 240 pounds ?Accompanied by her daughter [name] who provides more history. ?It [birth year] today and activities are planned through the week ? ?Significant tests/ events reviewed ? ?ABG 02/2020 7.4 2/44/53/87% ? ?PFTs 12/29/2018 ?FVC 2.31 (82), FEV1 1.20 (56), ratio 52, no bronchodilatory response, DLCO 51 % ?  ?Imaging ?11/04/18 CTA Chest- No PE; cardiomegaly and emphysematous changes of the lung  ?  ?NPSG 12/13/18  no obstructive sleep apnea, min O2 89%. Periodic limb movements did occur during sleep ? ?Review of Systems ?neg for any significant sore throat, dysphagia, itching, sneezing, nasal congestion or excess/ purulent secretions, fever, chills, sweats, unintended wt loss, pleuritic or exertional cp, hempoptysis, orthopnea pnd or change in chronic leg swelling. Also denies presyncope, palpitations, heartburn, abdominal pain,  nausea, vomiting, diarrhea or change in bowel or urinary habits, dysuria,hematuria, rash, arthralgias, visual complaints, headache, numbness weakness or ataxia. ? ?   ?Objective:  ? Physical Exam ? ? ?Gen. Pleasant, obese, in no distress, normal affect ?ENT - no pallor,icterus, no post nasal drip, class 2-3 airway ?Neck: No JVD, no thyromegaly, no carotid bruits ?Lungs: no use of accessory muscles, no dullness to percussion, decreased without rales or rhonchi  ?Cardiovascular: Rhythm regular, heart sounds  normal, no murmurs or gallops, no peripheral edema ?Abdomen: soft and non-tender, no hepatosplenomegaly, BS normal. ?Musculoskeletal: No deformities, no cyanosis or clubbing ?Neuro:  alert, non focal, no tremors ? ? ? ? ?   ?Assessment & Plan:  ? ? ?

## 2021-10-11 DIAGNOSIS — J449 Chronic obstructive pulmonary disease, unspecified: Secondary | ICD-10-CM | POA: Diagnosis not present

## 2021-10-11 DIAGNOSIS — I5033 Acute on chronic diastolic (congestive) heart failure: Secondary | ICD-10-CM | POA: Diagnosis not present

## 2021-10-24 ENCOUNTER — Other Ambulatory Visit: Payer: Self-pay | Admitting: Internal Medicine

## 2021-10-26 DIAGNOSIS — Z85828 Personal history of other malignant neoplasm of skin: Secondary | ICD-10-CM | POA: Diagnosis not present

## 2021-10-26 DIAGNOSIS — C44319 Basal cell carcinoma of skin of other parts of face: Secondary | ICD-10-CM | POA: Diagnosis not present

## 2021-10-26 DIAGNOSIS — D235 Other benign neoplasm of skin of trunk: Secondary | ICD-10-CM | POA: Diagnosis not present

## 2021-10-26 DIAGNOSIS — L820 Inflamed seborrheic keratosis: Secondary | ICD-10-CM | POA: Diagnosis not present

## 2021-10-26 DIAGNOSIS — D1801 Hemangioma of skin and subcutaneous tissue: Secondary | ICD-10-CM | POA: Diagnosis not present

## 2021-10-26 DIAGNOSIS — L280 Lichen simplex chronicus: Secondary | ICD-10-CM | POA: Diagnosis not present

## 2021-10-26 DIAGNOSIS — L918 Other hypertrophic disorders of the skin: Secondary | ICD-10-CM | POA: Diagnosis not present

## 2021-10-26 DIAGNOSIS — L821 Other seborrheic keratosis: Secondary | ICD-10-CM | POA: Diagnosis not present

## 2021-10-26 DIAGNOSIS — L723 Sebaceous cyst: Secondary | ICD-10-CM | POA: Diagnosis not present

## 2021-10-26 DIAGNOSIS — L814 Other melanin hyperpigmentation: Secondary | ICD-10-CM | POA: Diagnosis not present

## 2021-10-30 ENCOUNTER — Ambulatory Visit (INDEPENDENT_AMBULATORY_CARE_PROVIDER_SITE_OTHER): Payer: Medicare Other

## 2021-10-30 DIAGNOSIS — I10 Essential (primary) hypertension: Secondary | ICD-10-CM

## 2021-10-30 DIAGNOSIS — E1165 Type 2 diabetes mellitus with hyperglycemia: Secondary | ICD-10-CM

## 2021-10-30 DIAGNOSIS — J449 Chronic obstructive pulmonary disease, unspecified: Secondary | ICD-10-CM

## 2021-10-30 DIAGNOSIS — I5032 Chronic diastolic (congestive) heart failure: Secondary | ICD-10-CM

## 2021-10-30 NOTE — Chronic Care Management (AMB) (Signed)
Chronic Care Management   CCM RN Visit Note  10/30/2021 Name: Rebecca Mcintyre MRN: [id number] DOB: 04/22/1946  Subjective: Rebecca Mcintyre is a 76 y.o. year old female who is a primary care patient of Panosh, Standley Brooking, MD. The care management team was consulted for assistance with disease management and care coordination needs.    Engaged with patient by telephone for follow up visit in response to provider referral for case management and/or care coordination services.   Consent to Services:  The patient was given information about Chronic Care Management services, agreed to services, and gave verbal consent prior to initiation of services.  Please see initial visit note for detailed documentation.   Patient agreed to services and verbal consent obtained.   Assessment: Review of patient past medical history, allergies, medications, health status, including review of consultants reports, laboratory and other test data, was performed as part of comprehensive evaluation and provision of chronic care management services.   SDOH (Social Determinants of Health) assessments and interventions performed:    CCM Care Plan  Allergies  Allergen Reactions   Atorvastatin     REACTION: myalgias  and liver    Outpatient Encounter Medications as of 10/30/2021  Medication Sig   Accu-Chek Softclix Lancets lancets Use as instructed twice a day Diagnosis E11.9   acetaminophen (TYLENOL) 500 MG tablet Take 1,000 mg by mouth every 6 (six) hours as needed for mild pain. (Patient not taking: Reported on 10/03/2021)   albuterol (PROVENTIL) (2.5 MG/3ML) 0.083% nebulizer solution Take 3 mLs (2.5 mg total) by nebulization every 6 (six) hours as needed for wheezing or shortness of breath.   albuterol (VENTOLIN HFA) 108 (90 Base) MCG/ACT inhaler USE 2 PUFFS EVERY 6 HOURS AS NEEDED FOR WHEEZE OR SHORTNESS OF BREATH   allopurinol (ZYLOPRIM) 100 MG tablet TAKE 1 TABLET BY MOUTH TWICE A DAY   aspirin 81 MG tablet Take 81 mg  by mouth daily.   Blood Glucose Monitoring Suppl (ACCU-CHEK GUIDE) w/Device KIT Use as directed twice a day Diagnosis E11.9   Budeson-Glycopyrrol-Formoterol (BREZTRI AEROSPHERE) 160-9-4.8 MCG/ACT AERO Inhale 2 puffs into the lungs in the morning and at bedtime. (Patient not taking: Reported on 10/03/2021)   Budeson-Glycopyrrol-Formoterol (BREZTRI AEROSPHERE) 160-9-4.8 MCG/ACT AERO Inhale 2 puffs into the lungs in the morning and at bedtime. (Patient not taking: Reported on 10/03/2021)   Budeson-Glycopyrrol-Formoterol (BREZTRI AEROSPHERE) 160-9-4.8 MCG/ACT AERO Inhale 2 puffs into the lungs in the morning and at bedtime.   dapagliflozin propanediol (FARXIGA) 5 MG TABS tablet Take 1 tablet (5 mg total) by mouth daily before breakfast.   furosemide (LASIX) 40 MG tablet TAKE 1 TABLET BY MOUTH EVERY DAY   glucose blood (ACCU-CHEK GUIDE) test strip USE AS DIRECTED TWICE A DAY   ketoconazole (NIZORAL) 2 % cream Apply 1 application topically 2 (two) times daily.   KLOR-CON M10 10 MEQ tablet TAKE 1 TABLET BY MOUTH TWICE A DAY   metFORMIN (GLUCOPHAGE) 500 MG tablet TAKE 1 TABLET BY MOUTH TWICE A DAY WITH A MEAL INCREASE TO 2 TABLETS TWICE A DAY WITH A MEAL   metoprolol tartrate (LOPRESSOR) 25 MG tablet TAKE 1/2 TABLETS BY MOUTH 2 TIMES DAILY   nitroGLYCERIN (NITROSTAT) 0.4 MG SL tablet PLACE 1 TABLET UNDER THE TONGUE EVERY 5 MINUTES AS NEEDED. (Patient not taking: Reported on 10/03/2021)   predniSONE (DELTASONE) 10 MG tablet take 2 tabs daily with food x 5ds, then 1 tab daily with food x 5ds then STOP   rOPINIRole (REQUIP) 0.5  MG tablet TAKE 1 TABLET AT BEDTIME FOR RESTLESS LEG   simvastatin (ZOCOR) 40 MG tablet TAKE 1 TABLET BY MOUTH EVERYDAY AT BEDTIME   Spacer/Aero-Hold Chamber Bags MISC Please show patient how to use   No facility-administered encounter medications on file as of 10/30/2021.    Patient Active Problem List   Diagnosis Date Noted   Chronic respiratory failure with hypoxia ([place]) 07/11/2021    Elevated troponin 11/28/2020   Anterior communicating artery aneurysm 11/28/2020   Thyroid nodule 11/28/2020   Contusion of right foot    Educated about COVID-19 virus infection 08/04/2019   COPD (chronic obstructive pulmonary disease) ([place]) 12/29/2018   Restless leg syndrome 12/22/2018   Congestive heart failure ([place]) 12/13/2018   Acute on chronic diastolic CHF (congestive heart failure) ([place]) 11/05/2018   Acute CHF (congestive heart failure) ([place]) 11/04/2018   CHF (congestive heart failure) ([place]) 11/04/2018   Diabetes mellitus type II, non insulin dependent ([place]) 11/04/2018   Great toe pain, left 01/01/2018   Myocardial infarction [facility])    Elevated hemoglobin ([place]) 04/24/2014   Visit for preventive health examination 04/24/2014   Pre-diabetes 08/12/2012   Decreased hearing 04/12/2012   Welcome to Medicare preventive visit 04/08/2012   HYPERURICEMIA 03/15/2009   GOUT 12/13/2008   OBESITY 08/23/2008   OSTEOPENIA 09/01/2007   MYOCARDIAL INFARCTION, HX OF 07/14/2007   SOLITARY KIDNEY 07/14/2007   Hyperlipidemia 04/15/2007   Essential hypertension 04/15/2007   CAD (coronary artery disease) 04/15/2007   OTHER ABNORMAL GLUCOSE 04/15/2007   TOBACCO ABUSE, HX OF 04/15/2007    Conditions to be addressed/monitored:CHF, HTN, COPD, and DMII  Care Plan : RN Care Manager Plan of Care  Updates made by Dimitri Ped, RN since 10/30/2021 12:00 AM     Problem: Chronic Disease Management and Care Coordination Needs (COPD,DM2,CHF,HTN)   Priority: High     Long-Range Goal: Establish Plan of Care for Chronic Disease Management Needs (COPD,DM2,CHF,HTN)   Start Date: 04/06/2021  Expected End Date: 09/11/2022  Recent Progress: On track  Priority: High  Note:   Current Barriers:  Care Coordination needs related to Financial constraints related to medication in coverage gap Chronic Disease Management support and education needs related to CHF, HTN, HLD, COPD, and DMII  Financial  Constraints   States she is in the doughnut hole now and she can not afford her [place] anymore.  States she did not get approved for assistance for Bretri and she will go back on Symbicort and Spiriva.States she is still doing the home pulmonary rehab through an app from [facility] that Dr. [name] has arranged for her.  States she is walking twice a day for 6 minutes and is riding her exercise bike.  States she is doing breathing exercises and exercises with resistance bands. States she gets short of breath when she walks.  States she wears her O2 at all times.  Denies any chest pains or swelling.  States she weights daily and her weight stays around 237-240 most of the time.  States she will take a second Lasix if her weight goes up or if she has swelling in her legs as she has been directed to to do.  States she checks her CBG twice a day with fasting readings of 90-125 and at bedtime 150-180. Denies any low readings. States she is still trying to eat less CHO and more protein. States she has been following a low sodium diet more closely.  States she has not been drinking anything with sugar.  RNCM Clinical Goal(s):  Patient will verbalize basic understanding of  CHF, HTN, HLD, COPD, and DMII disease process and self health management plan as evidenced by voiced adherence to plan of care take all medications exactly as prescribed and will call provider for medication related questions as evidenced by dispense report and pt verbalization attend all scheduled medical appointments:  Pulmonary 01/23/22, Neurology 08/26/22 as evidenced by medical records demonstrate Improved adherence to prescribed treatment plan for CHF, HTN, HLD, COPD, and DMII as evidenced by readings within limits, adherence to plan of care work with pharmacist to address Financial constraints related to [place] related toCHF, HTN, HLD, COPD, and DMII as evidenced by review or EMR and patient or pharmacist report through collaboration with Midwife, provider, and care team.   Interventions: 1:1 collaboration with primary care provider regarding development and update of comprehensive plan of care as evidenced by provider attestation and co-signature Inter-disciplinary care team collaboration (see longitudinal plan of care) Evaluation of current treatment plan related to  self management and patient's adherence to plan as established by provider Communicated to CCM PharmD that pt is in coverage gap and can not afford Farxiga   Heart Failure Interventions:  (Status:  Goal on track:  Yes.) Long Term Goal Basic overview and discussion of pathophysiology of Heart Failure reviewed Provided education on low sodium diet Reviewed Heart Failure Action Plan in depth and provided written copy Discussed importance of daily weight and advised patient to weigh and record daily Reviewed role of diuretics in prevention of fluid overload and management of heart failure; Discussed the importance of keeping all appointments with provider Reinforced to continue to follow a low sodium diet. Reinforced dry weight goals of 235-240 per provider and use of extra Lasix as needed  COPD Interventions:  (Status:  Goal on track:  Yes.) Long Term Goal Advised patient to track and manage COPD triggers Provided instruction about proper use of medications used for management of COPD including inhalers Advised patient to self assesses COPD action plan zone and make appointment with provider if in the yellow zone for 48 hours without improvement Advised patient to engage in light exercise as tolerated 3-5 days a week to aid in the the management of COPD Provided education about and advised patient to utilize infection prevention strategies to reduce risk of respiratory infection Discussed the importance of adequate rest and management of fatigue with COPD Reviewed s/sx to call provider. Reinforce to continue virtual pulmonary rehab to help increase her  strength and energy. Reviewed to contact Dr. [name] about which inhalers he wants her to take   Diabetes Interventions:  (Status:  Goal on track:  Yes.) Long Term Goal Assessed patient's understanding of A1c goal: <7% Provided education to patient about basic DM disease process Reviewed medications with patient and discussed importance of medication adherence Counseled on importance of regular laboratory monitoring as prescribed Discussed plans with patient for ongoing care management follow up and provided patient with direct contact information for care management team Advised patient, providing education and rationale, to check cbg twice a day and record, calling provider for findings outside established parameters Referral made to pharmacy team for assistance with cost of [place] with CCM PharmD Review of patient status, including review of consultants reports, relevant laboratory and other test results, and medications completed Communicated to CCM PharmD about pt being in coverage gap and cost of [place].Reinforced  fasting blood sugar goals of 80-130 and less than 180 1 1/2-2 hours after  meals . Reinforced to follow a low carbohydrate low salt diet and to watch portion sizes Lab Results  Component Value Date   HGBA1C 7.5 (H) 04/10/2021   Hyperlipidemia Interventions:  (Status:  Condition stable.  Not addressed this visit.) Long Term Goal Medication review performed; medication list updated in electronic medical record.  Counseled on importance of regular laboratory monitoring as prescribed Reviewed role and benefits of statin for ASCVD risk reduction Reviewed importance of limiting foods high in cholesterol  Hypertension Interventions:  (Status:  Goal on track:  Yes.) Long Term Goal Last practice recorded BP readings:  BP Readings from Last 3 Encounters:  10/03/21 130/60  08/15/21 110/64  07/11/21 132/66  Most recent eGFR/CrCl:  Lab Results  Component Value Date   EGFR 74  07/05/2021    No components found for: CRCL  Evaluation of current treatment plan related to hypertension self management and patient's adherence to plan as established by provider Provided education to patient re: stroke prevention, s/s of heart attack and stroke Discussed plans with patient for ongoing care management follow up and provided patient with direct contact information for care management team Advised patient, providing education and rationale, to monitor blood pressure daily and record, calling PCP for findings outside established parameters Provided education on prescribed diet low sodium low CHO  Discussed complications of poorly controlled blood pressure such as heart disease, stroke, circulatory complications, vision complications, kidney impairment, sexual dysfunction Reinforced to continue increasing exercise with pulmonary rehab as tolerated     Health Maintenance Interventions:  (Status:  Goal Met.) Short Term Goal Historical Immunization for documented 1st Shingrix shot Patient provided CDC guideline information about Shingles vaccine    Received  2nd Shingrix shot at least 2 months after first vaccine    Patient Goals/Self-Care Activities: Take all medications as prescribed Attend all scheduled provider appointments Call pharmacy for medication refills 3-7 days in advance of running out of medications Perform all self care activities independently  Call provider office for new concerns or questions  call office if I gain more than 2 pounds in one day or 5 pounds in one week keep legs up while sitting watch for swelling in feet, ankles and legs every day weigh myself daily follow rescue plan if symptoms flare-up eat more whole grains, fruits and vegetables, lean meats and healthy fats track symptoms and what helps feel better or worse dress right for the weather, hot or cold keep appointment with eye doctor check blood sugar at prescribed times: twice daily and  when you have symptoms of low or high blood sugar check feet daily for cuts, sores or redness fill half of plate with vegetables manage portion size switch to sugar-free drinks keep feet up while sitting wash and dry feet carefully every day identify and remove indoor air pollutants limit outdoor activity during cold weather do breathing exercises every day attend pulmonary rehabilitation follow rescue plan if symptoms flare-up don't eat or exercise right before bedtime get at least 7 to 8 hours of sleep at night check blood pressure daily choose a place to take my blood pressure (home, clinic or office, retail store) take blood pressure log to all doctor appointments call doctor for signs and symptoms of high blood pressure eat more whole grains, fruits and vegetables, lean meats and healthy fats limit salt intake to 2369m/day call for medicine refill 2 or 3 days before it runs out take all medications exactly as prescribed call doctor with any symptoms you believe are  related to your medicine  Follow Up Plan:  Telephone follow up appointment with care management team member scheduled for:  01/01/22 The patient has been provided with contact information for the care management team and has been advised to call with any health related questions or concerns.          Plan:Telephone follow up appointment with care management team member scheduled for:  01/01/22 The patient has been provided with contact information for the care management team and has been advised to call with any health related questions or concerns.  Peter Garter RN, Jackquline [place], CDE Care Management Coordinator [facility] [phone]

## 2021-10-30 NOTE — Patient Instructions (Signed)
Visit Information  Thank you for taking time to visit with me today. Please don't hesitate to contact me if I can be of assistance to you before our next scheduled telephone appointment.  Following are the goals we discussed today:  Take all medications as prescribed Attend all scheduled provider appointments Call pharmacy for medication refills 3-7 days in advance of running out of medications Perform all self care activities independently  Call provider office for new concerns or questions  call office if I gain more than 2 pounds in one day or 5 pounds in one week keep legs up while sitting watch for swelling in feet, ankles and legs every day weigh myself daily follow rescue plan if symptoms flare-up eat more whole grains, fruits and vegetables, lean meats and healthy fats track symptoms and what helps feel better or worse dress right for the weather, hot or cold keep appointment with eye doctor check blood sugar at prescribed times: twice daily and when you have symptoms of low or high blood sugar check feet daily for cuts, sores or redness fill half of plate with vegetables manage portion size switch to sugar-free drinks keep feet up while sitting wash and dry feet carefully every day identify and remove indoor air pollutants limit outdoor activity during cold weather do breathing exercises every day attend pulmonary rehabilitation follow rescue plan if symptoms flare-up don't eat or exercise right before bedtime get at least 7 to 8 hours of sleep at night check blood pressure daily choose a place to take my blood pressure (home, clinic or office, retail store) take blood pressure log to all doctor appointments call doctor for signs and symptoms of high blood pressure eat more whole grains, fruits and vegetables, lean meats and healthy fats limit salt intake to 232m/day call for medicine refill 2 or 3 days before it runs out take all medications exactly as  prescribed call doctor with any symptoms you believe are related to your medicine  Our next appointment is by telephone on 01/01/22 at 10 AM  Please call the care guide team at [phone] you need to cancel or reschedule your appointment.   If you are experiencing a Mental Health or [place] need someone to talk to, please call the Suicide and Crisis Lifeline: 988 call the [place] Suicide Prevention Lifeline: 17091125908or TTY: 1646-852-8865TTY ((541)082-8969 to talk to a trained counselor call 1-800-273-TALK (toll free, 24 hour hotline) go to [facility] [address] [place]([phone] call 911   Patient verbalizes understanding of instructions and care plan provided today and agrees to view in [place] [facility] MyChart status and patient understanding of how to access instructions and care plan via MyChart confirmed with patient.     MPeter GarterRN, BJackquline [place] CDE Care Management Coordinator [facility] ([phone]

## 2021-11-01 ENCOUNTER — Encounter: Payer: Self-pay | Admitting: Internal Medicine

## 2021-11-01 ENCOUNTER — Encounter: Payer: Self-pay | Admitting: Pulmonary Disease

## 2021-11-07 ENCOUNTER — Other Ambulatory Visit: Payer: Self-pay | Admitting: Internal Medicine

## 2021-11-07 ENCOUNTER — Other Ambulatory Visit: Payer: Self-pay | Admitting: Pulmonary Disease

## 2021-11-14 ENCOUNTER — Ambulatory Visit: Payer: Medicare Other | Admitting: Adult Health

## 2021-11-15 NOTE — Telephone Encounter (Signed)
You May need to call her or daughter.  thanks

## 2021-11-16 ENCOUNTER — Other Ambulatory Visit: Payer: Self-pay | Admitting: Internal Medicine

## 2021-11-23 ENCOUNTER — Other Ambulatory Visit: Payer: Self-pay

## 2021-11-23 ENCOUNTER — Telehealth: Payer: Self-pay | Admitting: Pharmacist

## 2021-11-23 DIAGNOSIS — Z7984 Long term (current) use of oral hypoglycemic drugs: Secondary | ICD-10-CM | POA: Diagnosis not present

## 2021-11-23 DIAGNOSIS — E1159 Type 2 diabetes mellitus with other circulatory complications: Secondary | ICD-10-CM

## 2021-11-23 DIAGNOSIS — J449 Chronic obstructive pulmonary disease, unspecified: Secondary | ICD-10-CM

## 2021-11-23 DIAGNOSIS — E785 Hyperlipidemia, unspecified: Secondary | ICD-10-CM | POA: Diagnosis not present

## 2021-11-23 DIAGNOSIS — I110 Hypertensive heart disease with heart failure: Secondary | ICD-10-CM | POA: Diagnosis not present

## 2021-11-23 DIAGNOSIS — I509 Heart failure, unspecified: Secondary | ICD-10-CM

## 2021-11-23 MED ORDER — DAPAGLIFLOZIN PROPANEDIOL 5 MG PO TABS: 5.0000 mg | ORAL_TABLET | Freq: Every day | ORAL | 3 refills | Status: DC

## 2021-11-23 NOTE — Telephone Encounter (Signed)
Rx was sent to the pharmacy

## 2021-11-23 NOTE — Telephone Encounter (Signed)
Submitted patient assistance appeal to [employer] for Wilder Glade and Judithann Sauger with proof of income documents. Will follow up in 1-2 weeks to check on status of appeal.  Routing to [facility] to get a new prescription sent into [facility] pharmacy for [place].

## 2021-11-28 ENCOUNTER — Ambulatory Visit: Payer: Self-pay

## 2021-11-28 DIAGNOSIS — J449 Chronic obstructive pulmonary disease, unspecified: Secondary | ICD-10-CM

## 2021-11-28 DIAGNOSIS — E1165 Type 2 diabetes mellitus with hyperglycemia: Secondary | ICD-10-CM

## 2021-11-28 NOTE — Chronic Care Management (AMB) (Signed)
Chronic Care Management   CCM RN Visit Note  11/28/2021 Name: Rebecca Mcintyre MRN: [id number] DOB: 03-20-1946  Subjective: Rebecca Mcintyre is a 76 y.o. year old female who is a primary care patient of Panosh, Standley Brooking, MD. The care management team was consulted for assistance with disease management and care coordination needs.    Engaged with patient by telephone for  RNCM case closed goals met  in response to provider referral for case management and/or care coordination services.   Consent to Services:  The patient was given information about Chronic Care Management services, agreed to services, and gave verbal consent prior to initiation of services.  Please see initial visit note for detailed documentation.   Patient agreed to services and verbal consent obtained.   Assessment: Review of patient past medical history, allergies, medications, health status, including review of consultants reports, laboratory and other test data, was performed as part of comprehensive evaluation and provision of chronic care management services.   SDOH (Social Determinants of Health) assessments and interventions performed:    CCM Care Plan  Allergies  Allergen Reactions   Atorvastatin     REACTION: myalgias  and liver    Outpatient Encounter Medications as of 11/28/2021  Medication Sig   Accu-Chek Softclix Lancets lancets Use as instructed twice a day Diagnosis E11.9   acetaminophen (TYLENOL) 500 MG tablet Take 1,000 mg by mouth every 6 (six) hours as needed for mild pain. (Patient not taking: Reported on 10/03/2021)   albuterol (PROVENTIL) (2.5 MG/3ML) 0.083% nebulizer solution Take 3 mLs (2.5 mg total) by nebulization every 6 (six) hours as needed for wheezing or shortness of breath.   albuterol (VENTOLIN HFA) 108 (90 Base) MCG/ACT inhaler USE 2 PUFFS EVERY 6 HOURS AS NEEDED FOR WHEEZE OR SHORTNESS OF BREATH   allopurinol (ZYLOPRIM) 100 MG tablet TAKE 1 TABLET BY MOUTH TWICE A DAY   aspirin 81 MG  tablet Take 81 mg by mouth daily.   Blood Glucose Monitoring Suppl (ACCU-CHEK GUIDE) w/Device KIT Use as directed twice a day Diagnosis E11.9   Budeson-Glycopyrrol-Formoterol (BREZTRI AEROSPHERE) 160-9-4.8 MCG/ACT AERO Inhale 2 puffs into the lungs in the morning and at bedtime. (Patient not taking: Reported on 10/03/2021)   Budeson-Glycopyrrol-Formoterol (BREZTRI AEROSPHERE) 160-9-4.8 MCG/ACT AERO Inhale 2 puffs into the lungs in the morning and at bedtime. (Patient not taking: Reported on 10/03/2021)   Budeson-Glycopyrrol-Formoterol (BREZTRI AEROSPHERE) 160-9-4.8 MCG/ACT AERO Inhale 2 puffs into the lungs in the morning and at bedtime.   dapagliflozin propanediol (FARXIGA) 5 MG TABS tablet Take 1 tablet (5 mg total) by mouth daily before breakfast.   furosemide (LASIX) 40 MG tablet TAKE 1 TABLET BY MOUTH EVERY DAY   glucose blood (ACCU-CHEK GUIDE) test strip USE AS DIRECTED TWICE A DAY   ketoconazole (NIZORAL) 2 % cream Apply 1 application topically 2 (two) times daily.   KLOR-CON M10 10 MEQ tablet TAKE 1 TABLET BY MOUTH TWICE A DAY   metFORMIN (GLUCOPHAGE) 500 MG tablet TAKE 1 TABLET BY MOUTH TWICE A DAY WITH A MEAL INCREASE TO 2 TABLETS TWICE A DAY WITH A MEAL   metoprolol tartrate (LOPRESSOR) 25 MG tablet TAKE 1/2 TABLETS BY MOUTH 2 TIMES DAILY   nitroGLYCERIN (NITROSTAT) 0.4 MG SL tablet PLACE 1 TABLET UNDER THE TONGUE EVERY 5 MINUTES AS NEEDED. (Patient not taking: Reported on 10/03/2021)   predniSONE (DELTASONE) 10 MG tablet take 2 tabs daily with food x 5ds, then 1 tab daily with food x 5ds then STOP  rOPINIRole (REQUIP) 0.5 MG tablet TAKE 1 TABLET AT BEDTIME FOR RESTLESS LEG   simvastatin (ZOCOR) 40 MG tablet TAKE 1 TABLET BY MOUTH EVERYDAY AT BEDTIME   Spacer/Aero-Hold Chamber Bags MISC Please show patient how to use   No facility-administered encounter medications on file as of 11/28/2021.    Patient Active Problem List   Diagnosis Date Noted   Chronic respiratory failure with hypoxia  ([place]) 07/11/2021   Elevated troponin 11/28/2020   Anterior communicating artery aneurysm 11/28/2020   Thyroid nodule 11/28/2020   Contusion of right foot    Educated about COVID-19 virus infection 08/04/2019   COPD (chronic obstructive pulmonary disease) ([place]) 12/29/2018   Restless leg syndrome 12/22/2018   Congestive heart failure ([place]) 12/13/2018   Acute on chronic diastolic CHF (congestive heart failure) ([place]) 11/05/2018   Acute CHF (congestive heart failure) ([place]) 11/04/2018   CHF (congestive heart failure) ([place]) 11/04/2018   Diabetes mellitus type II, non insulin dependent ([place]) 11/04/2018   Great toe pain, left 01/01/2018   Myocardial infarction [facility])    Elevated hemoglobin ([place]) 04/24/2014   Visit for preventive health examination 04/24/2014   Pre-diabetes 08/12/2012   Decreased hearing 04/12/2012   Welcome to Medicare preventive visit 04/08/2012   HYPERURICEMIA 03/15/2009   GOUT 12/13/2008   OBESITY 08/23/2008   OSTEOPENIA 09/01/2007   MYOCARDIAL INFARCTION, HX OF 07/14/2007   SOLITARY KIDNEY 07/14/2007   Hyperlipidemia 04/15/2007   Essential hypertension 04/15/2007   CAD (coronary artery disease) 04/15/2007   OTHER ABNORMAL GLUCOSE 04/15/2007   TOBACCO ABUSE, HX OF 04/15/2007    Conditions to be addressed/monitored:COPD and DMII  Care Plan : RN Care Manager Plan of Care  Updates made by Dimitri Ped, RN since 11/28/2021 12:00 AM  Completed 11/28/2021   Problem: Chronic Disease Management and Care Coordination Needs (COPD,DM2,CHF,HTN) Resolved 11/28/2021  Priority: High     Long-Range Goal: Establish Plan of Care for Chronic Disease Management Needs (COPD,DM2,CHF,HTN) Completed 11/28/2021  Start Date: 04/06/2021  Expected End Date: 09/11/2022  Recent Progress: On track  Priority: High  Note:   RNCM case closed goals met Current Barriers:  Care Coordination needs related to Financial constraints related to medication in coverage gap Chronic Disease Management  support and education needs related to CHF, HTN, HLD, COPD, and DMII  Financial Constraints   States she is in the doughnut hole now and she can not afford her [place] anymore.  States she did not get approved for assistance for Bretri and she will go back on Symbicort and Spiriva.States she is still doing the home pulmonary rehab through an app from [facility] that Dr. [name] has arranged for her.  States she is walking twice a day for 6 minutes and is riding her exercise bike.  States she is doing breathing exercises and exercises with resistance bands. States she gets short of breath when she walks.  States she wears her O2 at all times.  Denies any chest pains or swelling.  States she weights daily and her weight stays around 237-240 most of the time.  States she will take a second Lasix if her weight goes up or if she has swelling in her legs as she has been directed to to do.  States she checks her CBG twice a day with fasting readings of 90-125 and at bedtime 150-180. Denies any low readings. States she is still trying to eat less CHO and more protein. States she has been following a low sodium diet more closely.  States she has not been drinking anything with sugar.   RNCM Clinical Goal(s):  Patient will verbalize basic understanding of  CHF, HTN, HLD, COPD, and DMII disease process and self health management plan as evidenced by voiced adherence to plan of care take all medications exactly as prescribed and will call provider for medication related questions as evidenced by dispense report and pt verbalization attend all scheduled medical appointments:  Pulmonary 01/23/22, Neurology 08/26/22 as evidenced by medical records demonstrate Improved adherence to prescribed treatment plan for CHF, HTN, HLD, COPD, and DMII as evidenced by readings within limits, adherence to plan of care work with pharmacist to address Financial constraints related to [place] related toCHF, HTN, HLD, COPD, and DMII as evidenced  by review or EMR and patient or pharmacist report through collaboration with Consulting civil engineer, provider, and care team.   Interventions: 1:1 collaboration with primary care provider regarding development and update of comprehensive plan of care as evidenced by provider attestation and co-signature Inter-disciplinary care team collaboration (see longitudinal plan of care) Evaluation of current treatment plan related to  self management and patient's adherence to plan as established by provider Communicated to CCM PharmD that pt is in coverage gap and can not afford Farxiga-approved for assistance   Heart Failure Interventions:  (Status:  Goal Met.) Long Term Goal Basic overview and discussion of pathophysiology of Heart Failure reviewed Provided education on low sodium diet Reviewed Heart Failure Action Plan in depth and provided written copy Discussed importance of daily weight and advised patient to weigh and record daily Reviewed role of diuretics in prevention of fluid overload and management of heart failure; Discussed the importance of keeping all appointments with provider Reinforced to continue to follow a low sodium diet. Reinforced dry weight goals of 235-240 per provider and use of extra Lasix as needed  COPD Interventions:  (Status:  Goal Met.) Long Term Goal Advised patient to track and manage COPD triggers Provided instruction about proper use of medications used for management of COPD including inhalers Advised patient to self assesses COPD action plan zone and make appointment with provider if in the yellow zone for 48 hours without improvement Advised patient to engage in light exercise as tolerated 3-5 days a week to aid in the the management of COPD Provided education about and advised patient to utilize infection prevention strategies to reduce risk of respiratory infection Discussed the importance of adequate rest and management of fatigue with COPD Reviewed s/sx to call  provider. Reinforce to continue virtual pulmonary rehab to help increase her strength and energy. Reviewed to contact Dr. [name] about which inhalers he wants her to take   Diabetes Interventions:  (Status:  Goal Met.) Long Term Goal Assessed patient's understanding of A1c goal: <7% Provided education to patient about basic DM disease process Reviewed medications with patient and discussed importance of medication adherence Counseled on importance of regular laboratory monitoring as prescribed Discussed plans with patient for ongoing care management follow up and provided patient with direct contact information for care management team Advised patient, providing education and rationale, to check cbg twice a day and record, calling provider for findings outside established parameters Referral made to pharmacy team for assistance with cost of [place] with CCM PharmD Review of patient status, including review of consultants reports, relevant laboratory and other test results, and medications completed Communicated to CCM PharmD about pt being in coverage gap and cost of [place].Reinforced  fasting blood sugar goals of 80-130 and less than 180  1 1/2-2 hours after meals . Reinforced to follow a low carbohydrate low salt diet and to watch portion sizes Lab Results  Component Value Date   HGBA1C 7.5 (H) 04/10/2021   Hyperlipidemia Interventions:  (Status:  Goal Met.) Long Term Goal Medication review performed; medication list updated in electronic medical record.  Counseled on importance of regular laboratory monitoring as prescribed Reviewed role and benefits of statin for ASCVD risk reduction Reviewed importance of limiting foods high in cholesterol  Hypertension Interventions:  (Status:  Goal Met.) Long Term Goal Last practice recorded BP readings:  BP Readings from Last 3 Encounters:  10/03/21 130/60  08/15/21 110/64  07/11/21 132/66  Most recent eGFR/CrCl:  Lab Results  Component  Value Date   EGFR 74 07/05/2021    No components found for: CRCL  Evaluation of current treatment plan related to hypertension self management and patient's adherence to plan as established by provider Provided education to patient re: stroke prevention, s/s of heart attack and stroke Discussed plans with patient for ongoing care management follow up and provided patient with direct contact information for care management team Advised patient, providing education and rationale, to monitor blood pressure daily and record, calling PCP for findings outside established parameters Provided education on prescribed diet low sodium low CHO  Discussed complications of poorly controlled blood pressure such as heart disease, stroke, circulatory complications, vision complications, kidney impairment, sexual dysfunction Reinforced to continue increasing exercise with pulmonary rehab as tolerated     Health Maintenance Interventions:  (Status:  Goal Met.) Short Term Goal Historical Immunization for documented 1st Shingrix shot Patient provided CDC guideline information about Shingles vaccine    Received  2nd Shingrix shot at least 2 months after first vaccine    Patient Goals/Self-Care Activities: Take all medications as prescribed Attend all scheduled provider appointments Call pharmacy for medication refills 3-7 days in advance of running out of medications Perform all self care activities independently  Call provider office for new concerns or questions  call office if I gain more than 2 pounds in one day or 5 pounds in one week keep legs up while sitting watch for swelling in feet, ankles and legs every day weigh myself daily follow rescue plan if symptoms flare-up eat more whole grains, fruits and vegetables, lean meats and healthy fats track symptoms and what helps feel better or worse dress right for the weather, hot or cold keep appointment with eye doctor check blood sugar at prescribed  times: twice daily and when you have symptoms of low or high blood sugar check feet daily for cuts, sores or redness fill half of plate with vegetables manage portion size switch to sugar-free drinks keep feet up while sitting wash and dry feet carefully every day identify and remove indoor air pollutants limit outdoor activity during cold weather do breathing exercises every day attend pulmonary rehabilitation follow rescue plan if symptoms flare-up don't eat or exercise right before bedtime get at least 7 to 8 hours of sleep at night check blood pressure daily choose a place to take my blood pressure (home, clinic or office, retail store) take blood pressure log to all doctor appointments call doctor for signs and symptoms of high blood pressure eat more whole grains, fruits and vegetables, lean meats and healthy fats limit salt intake to 2345m/day call for medicine refill 2 or 3 days before it runs out take all medications exactly as prescribed call doctor with any symptoms you believe are related to your medicine  Follow Up Plan:  The patient has been provided with contact information for the care management team and has been advised to call with any health related questions or concerns.  No further follow up required: RNCM case closed goals met          Plan:The patient has been provided with contact information for the care management team and has been advised to call with any health related questions or concerns.  No further follow up required: RNCM case closed goals met Peter Garter RN, [facility], CDE Care Management Coordinator [facility] [phone]

## 2021-11-28 NOTE — Patient Instructions (Signed)
Visit Information RNCM case closed goals met Thank you for allowing me to share the care management and care coordination services that are available to you as part of your health plan and services through your primary care provider and medical home. Please reach out to me at [phone] if the care management/care coordination team may be of assistance to you in the future.   Peter Garter RN, Jackquline Denmark, CDE Care Management Coordinator [facility] [phone]

## 2021-11-29 ENCOUNTER — Encounter: Payer: Self-pay | Admitting: Pulmonary Disease

## 2021-11-29 NOTE — Chronic Care Management (AMB) (Signed)
Spoke with Nita at [facility], both Wilder Glade and Judithann Sauger were approved today through 05/26/2022, both medications are at the pharmacy pending processing and shipment.  Both medications will ship to the patients home. Shipment will take 10-14 business days.

## 2021-12-05 DIAGNOSIS — C44319 Basal cell carcinoma of skin of other parts of face: Secondary | ICD-10-CM | POA: Diagnosis not present

## 2021-12-06 ENCOUNTER — Telehealth: Payer: Self-pay | Admitting: Pharmacist

## 2021-12-06 NOTE — Chronic Care Management (AMB) (Signed)
Chronic Care Management Pharmacy Assistant   Name: Rebecca Mcintyre  MRN: [id number] DOB: 07-13-1945  Reason for Encounter: Disease State / Hypertension Assessment Call   Conditions to be addressed/monitored: HTN  Recent office visits:  None  Recent consult visits:  10/03/2021 Rebecca Mead MD (pulmonary) - Patient was seen for Centrilobular emphysema and additional issues. Started Prednisone taper and Breztri 160-9-4.8 2 puffs twice daily. Discontinued Singulair.   Hospital visits:  None  Medications: Outpatient Encounter Medications as of 12/06/2021  Medication Sig   Accu-Chek Softclix Lancets lancets Use as instructed twice a day Diagnosis E11.9   acetaminophen (TYLENOL) 500 MG tablet Take 1,000 mg by mouth every 6 (six) hours as needed for mild pain. (Patient not taking: Reported on 10/03/2021)   albuterol (PROVENTIL) (2.5 MG/3ML) 0.083% nebulizer solution Take 3 mLs (2.5 mg total) by nebulization every 6 (six) hours as needed for wheezing or shortness of breath.   albuterol (VENTOLIN HFA) 108 (90 Base) MCG/ACT inhaler USE 2 PUFFS EVERY 6 HOURS AS NEEDED FOR WHEEZE OR SHORTNESS OF BREATH   allopurinol (ZYLOPRIM) 100 MG tablet TAKE 1 TABLET BY MOUTH TWICE A DAY   aspirin 81 MG tablet Take 81 mg by mouth daily.   Blood Glucose Monitoring Suppl (ACCU-CHEK GUIDE) w/Device KIT Use as directed twice a day Diagnosis E11.9   Budeson-Glycopyrrol-Formoterol (BREZTRI AEROSPHERE) 160-9-4.8 MCG/ACT AERO Inhale 2 puffs into the lungs in the morning and at bedtime. (Patient not taking: Reported on 10/03/2021)   Budeson-Glycopyrrol-Formoterol (BREZTRI AEROSPHERE) 160-9-4.8 MCG/ACT AERO Inhale 2 puffs into the lungs in the morning and at bedtime. (Patient not taking: Reported on 10/03/2021)   Budeson-Glycopyrrol-Formoterol (BREZTRI AEROSPHERE) 160-9-4.8 MCG/ACT AERO Inhale 2 puffs into the lungs in the morning and at bedtime.   dapagliflozin propanediol (FARXIGA) 5 MG TABS tablet Take 1 tablet (5 mg  total) by mouth daily before breakfast.   furosemide (LASIX) 40 MG tablet TAKE 1 TABLET BY MOUTH EVERY DAY   glucose blood (ACCU-CHEK GUIDE) test strip USE AS DIRECTED TWICE A DAY   ketoconazole (NIZORAL) 2 % cream Apply 1 application topically 2 (two) times daily.   KLOR-CON M10 10 MEQ tablet TAKE 1 TABLET BY MOUTH TWICE A DAY   metFORMIN (GLUCOPHAGE) 500 MG tablet TAKE 1 TABLET BY MOUTH TWICE A DAY WITH A MEAL INCREASE TO 2 TABLETS TWICE A DAY WITH A MEAL   metoprolol tartrate (LOPRESSOR) 25 MG tablet TAKE 1/2 TABLETS BY MOUTH 2 TIMES DAILY   nitroGLYCERIN (NITROSTAT) 0.4 MG SL tablet PLACE 1 TABLET UNDER THE TONGUE EVERY 5 MINUTES AS NEEDED. (Patient not taking: Reported on 10/03/2021)   predniSONE (DELTASONE) 10 MG tablet take 2 tabs daily with food x 5ds, then 1 tab daily with food x 5ds then STOP   rOPINIRole (REQUIP) 0.5 MG tablet TAKE 1 TABLET AT BEDTIME FOR RESTLESS LEG   simvastatin (ZOCOR) 40 MG tablet TAKE 1 TABLET BY MOUTH EVERYDAY AT BEDTIME   Spacer/Aero-Hold Chamber Bags MISC Please show patient how to use   No facility-administered encounter medications on file as of 12/06/2021.  Fill History: ALBUTEROL HFA 90 MCG INHALER 11/19/2021 25   ALLOPURINOL 100 MG TABLET 10/09/2021 90   BREZTRI AEROSPHERE INHALER 11/08/2021 30   FUROSEMIDE 40 MG TABLET 09/25/2021 90   METFORMIN HCL 500 MG TABLET 11/19/2021 90   METOPROLOL TARTRATE 25 MG TAB 09/20/2021 90   KLOR-CON M10 TABLET 10/24/2021 90   ROPINIROLE HCL 0.5 MG TABLET 11/09/2021 90   SIMVASTATIN 40 MG TABLET  09/24/2021 90   Reviewed chart prior to disease state call. Spoke with patient regarding BP  Recent Office Vitals: BP Readings from Last 3 Encounters:  10/03/21 130/60  08/15/21 110/64  07/11/21 132/66   Pulse Readings from Last 3 Encounters:  10/03/21 92  08/15/21 93  07/11/21 92    Wt Readings from Last 3 Encounters:  10/03/21 240 lb 12.8 oz (109.2 kg)  07/11/21 241 lb 12.8 oz (109.7 kg)  07/05/21 242  lb (109.8 kg)     Kidney Function Lab Results  Component Value Date/Time   CREATININE 1.04 10/03/2021 11:26 AM   CREATININE 0.83 07/05/2021 11:07 AM   GFR 52.76 (L) 10/03/2021 11:26 AM   GFRNONAA >60 12/01/2020 03:35 AM   GFRAA >60 11/12/2018 09:54 AM       Latest Ref Rng & Units 10/03/2021   11:26 AM 07/05/2021   11:07 AM 04/10/2021   10:25 AM  BMP  Glucose 70 - 99 mg/dL 106  113  103   BUN 6 - 23 mg/dL _0 Creatinine 0.40 - 1.20 mg/dL 1.04  0.83  0.90   BUN/Creat Ratio 12 - 28  22    Sodium 135 - 145 mEq/L 141  141  140   Potassium 3.5 - 5.1 mEq/L 4.2  4.8  4.0   Chloride 96 - 112 mEq/L 91  93  94   CO2 19 - 32 mEq/L 41  32  37   Calcium 8.4 - 10.5 mg/dL 10.0  10.0  9.8     Current antihypertensive regimen:  Metoprolol 25 mg 1/2 tablet twice daily  How often are you checking your Blood Pressure? Patient is checking blood pressures about 2-4 times per month.  Current home BP readings: She states yesterdays blood pressure was 134/75 however, it is normally around 115/70.  What recent interventions/DTPs have been made by any provider to improve Blood Pressure control since last CPP Visit: No recent interventions  Any recent hospitalizations or ED visits since last visit with CPP? No recent hospital visits  What diet changes have been made to improve Blood Pressure Control?  Patient follows low sodium and low sugar Breakfast - patient will have cereal with rasins or banana Lunch - patient will have a salad of some sort Dinner - patient will have a healthy choice dinner  What exercise is being done to improve your Blood Pressure Control?  Patient will walk around the house and do some housework, cooks her own meals and does her own laundry laundry  Adherence Review: Is the patient currently on ACE/ARB medication? No Does the patient have >5 day gap between last estimated fill dates? No  Care Gaps: AWV - completed 03/30/2021 Last BP - 130/60 on  10/03/2021 Last A1C - 7.5 on 04/10/2022 Tdap - overdue Malb - overdue Foot exam - overdue Covid booster - overdue HGA1C - overdue  Star Rating Drugs: Metformin 552m - last filled 11/19/2021 90 [facility] at [facility] Simvastatin 479m- last filled 09/24/2021 90 [facility] at [facility] Dapagliflozin 1077m filled with PAP  JanBrulearmacist Assistant [phone]

## 2021-12-24 ENCOUNTER — Other Ambulatory Visit: Payer: Self-pay | Admitting: *Deleted

## 2021-12-24 ENCOUNTER — Encounter: Payer: Self-pay | Admitting: *Deleted

## 2021-12-24 DIAGNOSIS — Z85828 Personal history of other malignant neoplasm of skin: Secondary | ICD-10-CM | POA: Diagnosis not present

## 2021-12-24 DIAGNOSIS — L720 Epidermal cyst: Secondary | ICD-10-CM | POA: Diagnosis not present

## 2021-12-24 NOTE — Patient Outreach (Signed)
  Care Coordination   Initial Visit Note   12/24/2021 Name: Rebecca Mcintyre MRN: [id number] DOB: [birth year]  Rebecca Mcintyre is a 76 y.o. year old female who sees Panosh, Standley Brooking, MD for primary care. I spoke with  [name] by phone today  What matters to the patients health and wellness today?  No needs presented at this time.   Goals Addressed               This Visit's Progress     No nothing to address at this time (pt-stated)        Care Coordination Interventions: Reviewed medications with patient and discussed purpose of all medications Reviewed scheduled/upcoming provider appointments including Screening for signs and symptoms of depression related to chronic disease state  Assessed social determinant of health barriers All appointments discussed along with providers and dates  Pt reports all medical conditions are currently controlled and managed well with no acute needs and on home O2 continuously for her COPD. Pt currently involved with provider's pharmacy for any medication needs.  No GAPS in CARE noted with review and pt has access to her [facility]          SDOH assessments and interventions completed:   Yes SDOH Interventions Today    Flowsheet Row Most Recent Value  SDOH Interventions   Food Insecurity Interventions Intervention Not Indicated  Housing Interventions Intervention Not Indicated  Transportation Interventions Intervention Not Indicated       Care Coordination Interventions Activated:  Yes Care Coordination Interventions:  Yes, provided  Follow up plan: No further intervention required.  Encounter Outcome:  Pt. Visit Completed  Raina Mina, RN Care Management Coordinator [place] Office [phone]

## 2021-12-27 ENCOUNTER — Other Ambulatory Visit: Payer: Self-pay | Admitting: Internal Medicine

## 2022-01-01 ENCOUNTER — Telehealth: Payer: Medicare Other

## 2022-01-06 ENCOUNTER — Other Ambulatory Visit: Payer: Self-pay | Admitting: Internal Medicine

## 2022-01-11 ENCOUNTER — Encounter: Payer: Self-pay | Admitting: Pulmonary Disease

## 2022-01-11 MED ORDER — PREDNISONE 10 MG PO TABS: ORAL_TABLET | ORAL | 0 refills | Status: AC

## 2022-01-14 ENCOUNTER — Ambulatory Visit (INDEPENDENT_AMBULATORY_CARE_PROVIDER_SITE_OTHER): Payer: Medicare Other

## 2022-01-14 ENCOUNTER — Encounter: Payer: Self-pay | Admitting: Pulmonary Disease

## 2022-01-14 ENCOUNTER — Other Ambulatory Visit: Payer: Self-pay

## 2022-01-14 ENCOUNTER — Ambulatory Visit: Payer: Medicare Other | Admitting: Pulmonary Disease

## 2022-01-14 VITALS — BP 140/60 | HR 81 | Wt 244.0 lb

## 2022-01-14 DIAGNOSIS — J441 Chronic obstructive pulmonary disease with (acute) exacerbation: Secondary | ICD-10-CM

## 2022-01-14 DIAGNOSIS — J449 Chronic obstructive pulmonary disease, unspecified: Secondary | ICD-10-CM | POA: Diagnosis not present

## 2022-01-14 DIAGNOSIS — R5383 Other fatigue: Secondary | ICD-10-CM

## 2022-01-14 NOTE — Patient Instructions (Addendum)
Fatigue Unlikely this is a COPD exacerbation. No respiratory symptoms. CXR with no new infiltrates CONTINUE Breztri as directed STOP prednisone ORDER CBC with diff and BMET  Keep follow-up with Dr. [name] at the end of the month

## 2022-01-14 NOTE — Progress Notes (Signed)
Subjective:   PATIENT ID: Rebecca Mcintyre GENDER: female DOB: 04-27-1946, MRN: [id number]   HPI  Chief Complaint  Patient presents with   Acute Visit    Weak after coming back from the beach    Reason for Visit: Acute visit  Ms. Rebecca Mcintyre is a 76 year old female with COPD, chronic respiratory failure and pulmonary hypertension who presents for acute visit. Her daughter present for clinic visit.  She reports feeling weak starting 5-7 days ago. More fatigued. Denies confusion or altered mental status. Denies respiratory symptoms. No coughing, wheezing, sputum production or shortness of breath. Has been compliant with her inhalers and started some leftover prednisone that she had at home for weakness.  PMH - CAD, diastolic HF,  dyslipidemia, obesity  - hospitalized 10/2018 for acute on chronic hypercarbic respiratory failure -slow to resolve COPD exacerbation 01/2020 - hospitalized 11/2020  for right lower extremity injury, course complicated by acute diastolic heart failure and encephalopathy due to narcotics elevated troponin   Social History: Quit 3 years ago.   I have personally reviewed patient's past medical/family/social history, allergies, current medications.  Past Medical History:  Diagnosis Date   Acute on chronic respiratory failure with hypoxia and hypercapnia (HCC) 11/05/2018   Allergy    ASCUS on Pap smear    had repeat x 2 normal with [name] Plan 2011   Coronary artery disease    Gout    Hyperlipidemia    Hypertension    Myocardial infarction [employer])    2002 95% proximal LAD stenosis with thrombus followed by 90% stenosis, the first diagonal 80% stenosis, circumflex 30% stenosis, circumflex obtuse marginal subbranch 70% stenosis, PDA 40% stenosis. She had a drug-eluting stent placement with a Pixel stent   Osteopenia    Solitary kidney      Family History  Problem Relation Age of Onset   Breast cancer Mother 63   Lung cancer Father    Heart attack  Brother        Died age 33 MI   Colon cancer Brother    Heart disease Daughter 98       smoker and overweight    Esophageal cancer Neg Hx    Pancreatic cancer Neg Hx    Rectal cancer Neg Hx    Stomach cancer Neg Hx      Social History   Occupational History   Occupation: semi retired    Fish farm manager: Clinical research associate  Tobacco Use   Smoking status: Former    Packs/day: 1.00    Years: 39.00    Total pack years: 39.00    Types: Cigarettes    Quit date: 11/04/2018    Years since quitting: 3.1   Smokeless tobacco: Never  Vaping Use   Vaping Use: Never used  Substance and Sexual Activity   Alcohol use: Yes    Comment: 1-2 per month   Drug use: No   Sexual activity: Not on file    Allergies  Allergen Reactions   Atorvastatin     REACTION: myalgias  and liver     Outpatient Medications Prior to Visit  Medication Sig Dispense Refill   Accu-Chek Softclix Lancets lancets Use as instructed twice a day Diagnosis E11.9 100 each 3   albuterol (PROVENTIL) (2.5 MG/3ML) 0.083% nebulizer solution Take 3 mLs (2.5 mg total) by nebulization every 6 (six) hours as needed for wheezing or shortness of breath. 300 mL 11   albuterol (VENTOLIN HFA) 108 (90 Base) MCG/ACT inhaler USE 2  PUFFS EVERY 6 HOURS AS NEEDED FOR WHEEZE OR SHORTNESS OF BREATH 8.5 each 5   allopurinol (ZYLOPRIM) 100 MG tablet TAKE 1 TABLET BY MOUTH TWICE A DAY 180 tablet 1   aspirin 81 MG tablet Take 81 mg by mouth daily.     Blood Glucose Monitoring Suppl (ACCU-CHEK GUIDE) w/Device KIT Use as directed twice a day Diagnosis E11.9 1 kit 2   Budeson-Glycopyrrol-Formoterol (BREZTRI AEROSPHERE) 160-9-4.8 MCG/ACT AERO Inhale 2 puffs into the lungs in the morning and at bedtime. 10.7 g 5   Budeson-Glycopyrrol-Formoterol (BREZTRI AEROSPHERE) 160-9-4.8 MCG/ACT AERO Inhale 2 puffs into the lungs in the morning and at bedtime. 10.7 g 11   Budeson-Glycopyrrol-Formoterol (BREZTRI AEROSPHERE) 160-9-4.8 MCG/ACT AERO Inhale 2 puffs into the lungs in  the morning and at bedtime. 5.9 g 0   dapagliflozin propanediol (FARXIGA) 5 MG TABS tablet Take 1 tablet (5 mg total) by mouth daily before breakfast. 90 tablet 3   furosemide (LASIX) 40 MG tablet TAKE 1 TABLET BY MOUTH EVERY DAY 90 tablet 1   glucose blood (ACCU-CHEK GUIDE) test strip USE AS DIRECTED TWICE A DAY 100 strip 3   ketoconazole (NIZORAL) 2 % cream Apply 1 application topically 2 (two) times daily. 30 g 0   KLOR-CON M10 10 MEQ tablet TAKE 1 TABLET BY MOUTH TWICE A DAY 180 tablet 1   metFORMIN (GLUCOPHAGE) 500 MG tablet TAKE 1 TABLET BY MOUTH TWICE A DAY WITH A MEAL INCREASE TO 2 TABLETS TWICE A DAY WITH A MEAL 360 tablet 0   metoprolol tartrate (LOPRESSOR) 25 MG tablet TAKE 1/2 TABLETS BY MOUTH 2 TIMES DAILY 90 tablet 1   nitroGLYCERIN (NITROSTAT) 0.4 MG SL tablet PLACE 1 TABLET UNDER THE TONGUE EVERY 5 MINUTES AS NEEDED. 25 tablet 1   predniSONE (DELTASONE) 10 MG tablet Take 4 tablets (40 mg total) by mouth daily with breakfast for 2 days, THEN 3 tablets (30 mg total) daily with breakfast for 2 days, THEN 2 tablets (20 mg total) daily with breakfast for 2 days, THEN 1 tablet (10 mg total) daily with breakfast for 2 days. 20 tablet 0   rOPINIRole (REQUIP) 0.5 MG tablet TAKE 1 TABLET AT BEDTIME FOR RESTLESS LEG 90 tablet 0   simvastatin (ZOCOR) 40 MG tablet TAKE 1 TABLET BY MOUTH EVERYDAY AT BEDTIME 90 tablet 3   Spacer/Aero-Hold Chamber Bags MISC Please show patient how to use 1 each 0   acetaminophen (TYLENOL) 500 MG tablet Take 1,000 mg by mouth every 6 (six) hours as needed for mild pain. (Patient not taking: Reported on 01/14/2022)     predniSONE (DELTASONE) 10 MG tablet take 2 tabs daily with food x 5ds, then 1 tab daily with food x 5ds then STOP (Patient not taking: Reported on 01/14/2022) 20 tablet 0   No facility-administered medications prior to visit.    Review of Systems  Constitutional:  Positive for malaise/fatigue. Negative for chills, diaphoresis, fever and weight loss.   HENT:  Negative for congestion.   Respiratory:  Negative for cough, hemoptysis, sputum production, shortness of breath and wheezing.   Cardiovascular:  Negative for chest pain, palpitations and leg swelling.     Objective:   Vitals:   01/14/22 1445  BP: (!) 140/60  Pulse: 81  SpO2: 90%  Weight: 244 lb (110.7 kg)   SpO2: 90 % (3L) O2 Device: None (Room air)  Physical Exam: General: Elderly, chronically ill-appearing, no acute distress HENT: [place], AT Eyes: EOMI, no scleral icterus Respiratory: Clear to auscultation  bilaterally.  No crackles, wheezing or rales Cardiovascular: RRR, -M/R/G, no JVD Extremities:-Edema,-tenderness Neuro: AAO x4, CNII-XII grossly intact Psych: Normal mood, normal affect   Data Reviewed:  Imaging: CXR 01/14/22 - no new infiltrate. Chronic interstitial changes  Sleep 12/12/21 - Normal AHI   Assessment & Plan:   Discussion:  Fatigue Unlikely this is a COPD exacerbation. No respiratory symptoms. CXR with no new infiltrates CONTINUE Breztri as directed STOP prednisone ORDER CBC with diff and BMET  Health Maintenance Immunization History  Administered Date(s) Administered   Fluad Quad(high Dose 65+) 02/11/2019, 02/29/2020, 04/10/2021   Influenza Split 04/29/2011, 04/08/2012   Influenza Whole 02/16/2008, 03/15/2009, 03/20/2010   Influenza, High Dose Seasonal PF 04/14/2013, 02/14/2015, 02/28/2016, 03/10/2017, 03/25/2018   Influenza,inj,Quad PF,6+ Mos 04/18/2014   Influenza,inj,quad, With Preservative 02/25/2019   Influenza-Unspecified 02/24/2017   PFIZER(Purple Top)SARS-COV-2 Vaccination 06/15/2019, 07/06/2019, 04/13/2020   Pneumococcal Conjugate-13 04/08/2012   Pneumococcal Polysaccharide-23 04/14/2013   Td 09/14/2007   Unspecified SARS-COV-2 Vaccination 04/25/2021   Zoster Recombinat (Shingrix) 06/30/2021, 10/12/2021   Zoster, Live 09/14/2007    Orders Placed This Encounter  Procedures   Basic Metabolic Panel (BMET)    Standing  Status:   Future    Number of Occurrences:   1    Standing Expiration Date:   01/14/2023   CBC w/Diff    Standing Status:   Future    Number of Occurrences:   1    Standing Expiration Date:   01/14/2023  No orders of the defined types were placed in this encounter.   No follow-ups on file. Follow-up with Dr. [name]  I have spent a total time of35-minutes on the day of the appointment reviewing prior documentation, coordinating care and discussing medical diagnosis and plan with the patient/family. Imaging, labs and tests included in this note have been reviewed and interpreted independently by me.  [place], MD [facility] 01/14/2022 3:00 PM  Office Number [phone]

## 2022-01-15 LAB — BASIC METABOLIC PANEL
BUN: 33 mg/dL — ABNORMAL HIGH (ref 6–23)
CO2: 37 mEq/L — ABNORMAL HIGH (ref 19–32)
Calcium: 9.4 mg/dL (ref 8.4–10.5)
Chloride: 93 mEq/L — ABNORMAL LOW (ref 96–112)
Creatinine, Ser: 1 mg/dL (ref 0.40–1.20)
GFR: 55.2 mL/min — ABNORMAL LOW (ref >60.00)
Glucose, Bld: 121 mg/dL — ABNORMAL HIGH (ref 70–99)
Potassium: 3.9 mEq/L (ref 3.5–5.1)
Sodium: 139 mEq/L (ref 135–145)

## 2022-01-15 LAB — CBC WITH DIFFERENTIAL/PLATELET
Basophils Absolute: 0.1 10*3/uL (ref 0.0–0.1)
Basophils Relative: 0.6 % (ref 0.0–3.0)
Eosinophils Absolute: 0.1 10*3/uL (ref 0.0–0.7)
Eosinophils Relative: 1.6 % (ref 0.0–5.0)
HCT: 42.9 % (ref 36.0–46.0)
Hemoglobin: 13.6 g/dL (ref 12.0–15.0)
Lymphocytes Relative: 29 % (ref 12.0–46.0)
Lymphs Abs: 2.6 10*3/uL (ref 0.7–4.0)
MCHC: 31.8 g/dL (ref 30.0–36.0)
MCV: 95.2 fl (ref 78.0–100.0)
Monocytes Absolute: 0.9 10*3/uL (ref 0.1–1.0)
Monocytes Relative: 10.5 % (ref 3.0–12.0)
Neutro Abs: 5.3 10*3/uL (ref 1.4–7.7)
Neutrophils Relative %: 58.3 % (ref 43.0–77.0)
Platelets: 241 10*3/uL (ref 150.0–400.0)
RBC: 4.5 Mil/uL (ref 3.87–5.11)
RDW: 14.9 % (ref 11.5–15.5)
WBC: 9.1 10*3/uL (ref 4.0–10.5)

## 2022-01-23 ENCOUNTER — Ambulatory Visit: Payer: Medicare Other | Admitting: Pulmonary Disease

## 2022-01-23 ENCOUNTER — Encounter: Payer: Self-pay | Admitting: Pulmonary Disease

## 2022-01-23 ENCOUNTER — Encounter: Payer: Self-pay | Admitting: Cardiology

## 2022-01-23 VITALS — BP 126/76 | HR 83 | Temp 98.0°F | Ht 63.0 in | Wt 241.2 lb

## 2022-01-23 DIAGNOSIS — I5032 Chronic diastolic (congestive) heart failure: Secondary | ICD-10-CM | POA: Diagnosis not present

## 2022-01-23 DIAGNOSIS — R7981 Abnormal blood-gas level: Secondary | ICD-10-CM

## 2022-01-23 DIAGNOSIS — E785 Hyperlipidemia, unspecified: Secondary | ICD-10-CM

## 2022-01-23 DIAGNOSIS — J9611 Chronic respiratory failure with hypoxia: Secondary | ICD-10-CM

## 2022-01-23 DIAGNOSIS — J432 Centrilobular emphysema: Secondary | ICD-10-CM

## 2022-01-23 NOTE — Assessment & Plan Note (Addendum)
She does not seem to be having an exacerbation.  No bronchospasm on exam and symptoms are not typical. She is compliant with Breztri and refills will be provided We will refer her to interventional pulmonology for Zephyr valve evaluation at her request

## 2022-01-23 NOTE — Assessment & Plan Note (Addendum)
If her symptoms persist I would check an ABG for hypercarbia We will put her in touch with [employer] to discuss portable concentrator   The patient continues to exhibit signs of hypercapnea associated with chronic respiratory failure secondary to severe COPD .  Interruption or failure to provide NIV would quickly lead to exacerbation of the patient's condition, hospital readmission, and likely harm the patient.  Continued use is preferred.  The use of the NIV will treat the patient's PCO2 levels and can reduce the risk of exacerbations and future hospitalizations when used at night and during the day.  Bilevel/RAD therapy with and without a rate would be ineffective as the patient requires a volume targeted mode.  Ventilation is required to decrease the work of breathing and improve pulmonary status.  Interruption of ventilator support would lead to a decline of health status.  Patient is able to protect their airways and clear secretions on their own.

## 2022-01-23 NOTE — Addendum Note (Signed)
Addended by: Gavin Potters R on: 01/23/2022 11:39 AM   Modules accepted: Orders

## 2022-01-23 NOTE — Patient Instructions (Signed)
X referral to [employer] rep for evaluation of POC.  X referral to [facility] or [facility] for Zephyr valve

## 2022-01-23 NOTE — Progress Notes (Signed)
   Subjective:    Patient ID: Rebecca Mcintyre, female    DOB: 10/17/1945, 76 y.o.   MRN: [id number]  HPI  76 yo ex-smoker for follow-up of COPD , chronic hypoxic respiratory failure and pulmonary hypertension Identical twin of Treina Arscott ,my patient who passed away   PMH - CAD, diastolic HF,  dyslipidemia, obesity  - hospitalized 10/2018 for acute on chronic hypercarbic respiratory failure -slow to resolve COPD exacerbation 01/2020 - hospitalized 11/2020  for right lower extremity injury, course complicated by acute diastolic heart failure and encephalopathy due to narcotics elevated troponin   Last office visit 09/2021 -treated for exacerbation, noncompliance with Judithann Sauger, course of prednisone She saw my partner on 8/21 for generalized weakness, felt not to be a COPD exacerbation .  Prednisone taper that had been sent in earlier was stopped.  Labs appear to be normal, BUN/creatinine was 33/1.0 Chest x-ray showed chronic interstitial prominence, no effusions or edema She arrives in a wheelchair today accompanied by her daughter who corroborates history. She again complains of generalized weakness since her return from the beach vacation.  She had more energy then.  She denies body aches, fevers.  She COVID tested negative at home. She is compliant with Breztri and Lasix. Weight is stable She would like evaluation for Zephyr valve for emphysema She also requests POC for continuous oxygen, she uses 3 L continuous while exertion and sometimes 2 L continuous at rest. She reports increased sleepiness in the daytime, saturations have been running 90 to 92%   Significant tests/ events reviewed ABG 02/2020 7.4 2/44/53/87%   PFTs 12/29/2018 FVC 2.31 (82), FEV1 1.20 (56), ratio 52, no bronchodilatory response, DLCO 51 %   10/2018 CTA Chest- No PE; cardiomegaly, emphysema     NPSG 12/13/18  no obstructive sleep apnea, min O2 89%. Periodic limb movements did occur during sleep   Review of  Systems neg for any significant sore throat, dysphagia, itching, sneezing, nasal congestion or excess/ purulent secretions, fever, chills, sweats, unintended wt loss, pleuritic or exertional cp, hempoptysis, orthopnea pnd or change in chronic leg swelling. Also denies presyncope, palpitations, heartburn, abdominal pain, nausea, vomiting, diarrhea or change in bowel or urinary habits, dysuria,hematuria, rash, arthralgias, visual complaints, headache, numbness weakness or ataxia.     Objective:   Physical Exam  Gen. Pleasant, obese, in no distress ENT - no lesions, no post nasal drip Neck: No JVD, no thyromegaly, no carotid bruits Lungs: no use of accessory muscles, no dullness to percussion, decreased without rales or rhonchi  Cardiovascular: Rhythm regular, heart sounds  normal, no murmurs or gallops, 1+ peripheral edema Musculoskeletal: No deformities, no cyanosis or clubbing , no tremors Stasis changes in both legs       Assessment & Plan:

## 2022-01-23 NOTE — Assessment & Plan Note (Signed)
Weight appears stable, no evidence of fluid overload Continue Lasix.

## 2022-01-24 NOTE — Telephone Encounter (Signed)
Dr [name] placed ABG order himself on 8/30.  This order is placed differently than is normally put in.  Orders are normally placed as PFT and within order it is indicated it is for ABG.  We call [facility] to get them to schedule these.  I have called [facility] and left vm letting them know how RA placed the order.  I don't know if they can schedule it from the way he put it in?  In vm I asked them to call our office if there is an issue.  I will route message back to triage for message to be closed and to see if corrected order needs to go ahead and be placed.

## 2022-01-24 NOTE — Telephone Encounter (Signed)
PCC's, pt is requesting the ABG appt. Will respiratory call them? Thanks.

## 2022-01-25 NOTE — Telephone Encounter (Signed)
New order placed for PFT/ABG.

## 2022-01-29 DIAGNOSIS — E785 Hyperlipidemia, unspecified: Secondary | ICD-10-CM | POA: Diagnosis not present

## 2022-01-29 NOTE — Telephone Encounter (Signed)
Will forward back to PCCs to get scheduled.

## 2022-01-29 NOTE — Telephone Encounter (Signed)
I have called [facility] again this morning about scheduling ABG and I sent a staff message to Doris Cheadle that does the scheduling.  These have to be done at [facility] by the [facility] Dept.  Can not be done at [facility].  Will route back to triage to make pt aware thru [facility].

## 2022-01-30 LAB — LIPID PANEL
Chol/HDL Ratio: 3.9 ratio (ref 0.0–4.4)
Cholesterol, Total: 167 mg/dL (ref 100–199)
HDL: 43 mg/dL (ref >39)
LDL Chol Calc (NIH): 99 mg/dL (ref 0–99)
Triglycerides: 139 mg/dL (ref 0–149)
VLDL Cholesterol Cal: 25 mg/dL (ref 5–40)

## 2022-01-30 NOTE — Progress Notes (Signed)
Cardiology Office Note   Date:  01/31/2022   ID:  Rebecca Mcintyre, DOB [birth year], MRN [id number]  PCP:  Burnis Medin, MD  Cardiologist:   Minus Breeding, MD   Chief Complaint  Patient presents with   Fatigue   Weakness   Shortness of Breath     History of Present Illness: Rebecca Mcintyre is a 76 y.o. female who presents for evaluation of CAD. I saw her in 2014.  She has a history of an anterior MI in 2002.   She had DES to the LAD.    She was in the hospital after syncopal episode in June 2020.    She had acute hypoxic respiratory failure.  She had acute on chronic diastolic dysfunction.  She had acute cor pulmonale.  She did require some diuresis.  She had an echocardiogram which demonstrated well-preserved left ventricular function with some suggestion of diastolic dysfunction.  There was elevated pulmonary pressures noted.  She had moderate RV dysfunction.  She did have diuresis of 10.2 L.  She was sent home on 40 mg of Lasix.  She was also sent home on home O2 and pulmonary meds as listed.  She did have a follow-up echo.  Her pulmonary pressures seem to be reduced and RV function improved.  In July 2022 she was hospitalized with acute on chronic respiratory failure with acute on chronic diastolic HF.  She had acute encephalopathy thought to be related to narcotics.  During that hospitalization she had heart failure with preserved ejection fraction require diuresis.  She had an elevated troponin which was felt to be demand ischemia.  Echo demonstrated no new wall motion abnormalities.   She returns for follow up.  She had been doing well.  She went on vacation at the beach and she actually had a good time and was able to go up and down stairs.  She did have a skin lesion removed and had a drainage from that while she was on vacation.  It seemed to have healed but then it drained again today.  This was on her forehead.  She has not had any fevers or chills.  However, since getting back from  the beach she has been overwhelmingly fatigued and depressed.  She had increasing shortness of breath.  She had leg weakness and actually fell because of this.  She is not describing new PND or orthopnea.  She is not describing new palpitations, presyncope or syncope.  She has not had any new chest pressure, neck or arm discomfort.  She is chronically on O2.  She has had a bit of a headache.  I did review notes from Dr. [name].  He did a chest x-ray and did not find any acute abnormalities.  She was briefly treated with steroids which she was not thought to be having a flare of her chronic lung disease.  An ABG has been ordered has not yet been done.   Past Medical History:  Diagnosis Date   Acute on chronic respiratory failure with hypoxia and hypercapnia (HCC) 11/05/2018   Allergy    ASCUS on Pap smear    had repeat x 2 normal with [name] Plan 2011   Coronary artery disease    Gout    Hyperlipidemia    Hypertension    Myocardial infarction [facility])    2002 95% proximal LAD stenosis with thrombus followed by 90% stenosis, the first diagonal 80% stenosis, circumflex 30% stenosis, circumflex obtuse marginal subbranch 70%  stenosis, PDA 40% stenosis. She had a drug-eluting stent placement with a Pixel stent   Osteopenia    Solitary kidney     Past Surgical History:  Procedure Laterality Date   CARDIAC CATHETERIZATION     CORONARY ANGIOPLASTY     CORONARY ANGIOPLASTY WITH STENT PLACEMENT  2002   HEMORRHOID SURGERY  2011   LAD Stent angioplasty  2002   Right oophorectomy with salpingectomy     benign growth Dr. Joneen Caraway     Current Outpatient Medications  Medication Sig Dispense Refill   Accu-Chek Softclix Lancets lancets Use as instructed twice a day Diagnosis E11.9 100 each 3   acetaminophen (TYLENOL) 500 MG tablet Take 1,000 mg by mouth every 6 (six) hours as needed for mild pain.     albuterol (PROVENTIL) (2.5 MG/3ML) 0.083% nebulizer solution Take 3 mLs (2.5 mg total) by nebulization  every 6 (six) hours as needed for wheezing or shortness of breath. 300 mL 11   albuterol (VENTOLIN HFA) 108 (90 Base) MCG/ACT inhaler USE 2 PUFFS EVERY 6 HOURS AS NEEDED FOR WHEEZE OR SHORTNESS OF BREATH 8.5 each 5   allopurinol (ZYLOPRIM) 100 MG tablet TAKE 1 TABLET BY MOUTH TWICE A DAY 180 tablet 1   aspirin 81 MG tablet Take 81 mg by mouth daily.     Blood Glucose Monitoring Suppl (ACCU-CHEK GUIDE) w/Device KIT Use as directed twice a day Diagnosis E11.9 1 kit 2   Budeson-Glycopyrrol-Formoterol (BREZTRI AEROSPHERE) 160-9-4.8 MCG/ACT AERO Inhale 2 puffs into the lungs in the morning and at bedtime. 10.7 g 5   Budeson-Glycopyrrol-Formoterol (BREZTRI AEROSPHERE) 160-9-4.8 MCG/ACT AERO Inhale 2 puffs into the lungs in the morning and at bedtime. 10.7 g 11   dapagliflozin propanediol (FARXIGA) 5 MG TABS tablet Take 1 tablet (5 mg total) by mouth daily before breakfast. 90 tablet 3   furosemide (LASIX) 40 MG tablet TAKE 1 TABLET BY MOUTH EVERY DAY 90 tablet 1   glucose blood (ACCU-CHEK GUIDE) test strip USE AS DIRECTED TWICE A DAY 100 strip 3   ketoconazole (NIZORAL) 2 % cream Apply 1 application topically 2 (two) times daily. 30 g 0   KLOR-CON M10 10 MEQ tablet TAKE 1 TABLET BY MOUTH TWICE A DAY 180 tablet 1   metFORMIN (GLUCOPHAGE) 500 MG tablet TAKE 1 TABLET BY MOUTH TWICE A DAY WITH A MEAL INCREASE TO 2 TABLETS TWICE A DAY WITH A MEAL 360 tablet 0   metoprolol tartrate (LOPRESSOR) 25 MG tablet TAKE 1/2 TABLETS BY MOUTH 2 TIMES DAILY 90 tablet 1   nitroGLYCERIN (NITROSTAT) 0.4 MG SL tablet PLACE 1 TABLET UNDER THE TONGUE EVERY 5 MINUTES AS NEEDED. 25 tablet 1   rOPINIRole (REQUIP) 0.5 MG tablet TAKE 1 TABLET AT BEDTIME FOR RESTLESS LEG 90 tablet 0   simvastatin (ZOCOR) 40 MG tablet TAKE 1 TABLET BY MOUTH EVERYDAY AT BEDTIME 90 tablet 3   Spacer/Aero-Hold Chamber Bags MISC Please show patient how to use 1 each 0   No current facility-administered medications for this visit.    Allergies:    Atorvastatin    ROS:  Please see the history of present illness.   Otherwise, review of systems are positive for none.   All other systems are reviewed and negative.    PHYSICAL EXAM: VS:  BP 101/61   Pulse 83   Ht _0  (1.6 m)   Wt 239 lb 9.6 oz (108.7 kg)   SpO2 91%   BMI 42.44 kg/m  , BMI Body mass index  is 42.44 kg/m. GENERAL: Somewhat frail appearing GEN:  No distress NECK:  No jugular venous distention at 90 degrees, waveform within normal limits, carotid upstroke brisk and symmetric, no bruits, no thyromegaly LYMPHATICS:  No cervical adenopathy LUNGS:  Clear to auscultation bilaterally BACK:  No CVA tenderness CHEST:  Unremarkable HEART:  S1 and S2 within normal limits, no S3, no S4, no clicks, no rubs, no murmurs ABD:  Positive bowel sounds normal in frequency in pitch, no bruits, no rebound, no guarding, unable to assess midline mass or bruit with the patient seated. EXT:  2 plus pulses throughout, trace to mild bilateral leg edema, no cyanosis no clubbing SKIN:  No rashes no nodules NEURO:  Cranial nerves II through XII grossly intact, motor grossly intact throughout PSYCH:  Cognitively intact, oriented to person place and time   EKG:  EKG is ordered today. Sinus rhythm, rate 83,  axis within normal limits, intervals within normal limits, poor anterior R wave progression, no acute ST-T wave changes.   Recent Labs: 10/03/2021: ALT 17; Magnesium 2.0 01/14/2022: BUN 33; Creatinine, Ser 1.00; Hemoglobin 13.6; Platelets 241.0; Potassium 3.9; Sodium 139    Lipid Panel    Component Value Date/Time   CHOL 167 01/29/2022 0914   TRIG 139 01/29/2022 0914   HDL 43 01/29/2022 0914   CHOLHDL 3.9 01/29/2022 0914   CHOLHDL 4 04/10/2021 1025   VLDL 31.6 04/10/2021 1025   LDLCALC 99 01/29/2022 0914   LDLDIRECT 116.0 02/05/2019 1105      Wt Readings from Last 3 Encounters:  01/31/22 239 lb 9.6 oz (108.7 kg)  01/23/22 241 lb 3.2 oz (109.4 kg)  01/14/22 244 lb (110.7 kg)       Other studies Reviewed: Additional studies/ records that were reviewed today include: Pulmonary records Review of the above records demonstrates:  Please see elsewhere in the note.     ASSESSMENT AND PLAN:  CAD: I am not strongly suspecting ischemic heart disease.  She will continue with risk reduction.  DYSLIPIDEMIA:     LDL was 99.  HDL was 43.    She has been intolerant of multiple medications.  No change in therapy.    HTN:  The blood pressure is running low.  She is going to keep a blood pressure diary.   DIABETES:  Her hemoglobin A1c was 7.5. I will defer to her    CHRONIC DIASTOLIC DYSFUNCTION:   There is no edema on chest x-ray.  She seems to be euvolemic.  No change in therapy.  PERICARDIAL EFFUSION: Last year after she had trauma she had an echocardiogram and there was a small pericardial effusion.  She is more short of breath.  I will follow-up with an echocardiogram.    FATIGUE: I will check a TSH.  She has not been anemic.  I sent a note to Dr. [name] about seeing if we could expedite the ABG.    Current medicines are reviewed at length with the patient today.  The patient does not have concerns regarding medicines.  The following changes have been made: None  Labs/ tests ordered today include:      Orders Placed This Encounter  Procedures   TSH   EKG 12-Lead   ECHOCARDIOGRAM COMPLETE    Disposition:   FU with me in about 4 months.   Signed, Minus Breeding, MD  01/31/2022 12:00 PM    [place]

## 2022-01-31 ENCOUNTER — Encounter: Payer: Self-pay | Admitting: Cardiology

## 2022-01-31 ENCOUNTER — Ambulatory Visit: Payer: Medicare Other | Attending: Cardiology | Admitting: Cardiology

## 2022-01-31 VITALS — BP 101/61 | HR 83 | Ht 63.0 in | Wt 239.6 lb

## 2022-01-31 DIAGNOSIS — I5032 Chronic diastolic (congestive) heart failure: Secondary | ICD-10-CM | POA: Diagnosis not present

## 2022-01-31 DIAGNOSIS — I10 Essential (primary) hypertension: Secondary | ICD-10-CM

## 2022-01-31 DIAGNOSIS — R5383 Other fatigue: Secondary | ICD-10-CM

## 2022-01-31 DIAGNOSIS — I2510 Atherosclerotic heart disease of native coronary artery without angina pectoris: Secondary | ICD-10-CM

## 2022-01-31 DIAGNOSIS — E118 Type 2 diabetes mellitus with unspecified complications: Secondary | ICD-10-CM

## 2022-01-31 NOTE — Patient Instructions (Signed)
Medication Instructions:  The current medical regimen is effective;  continue present plan and medications.  *If you need a refill on your cardiac medications before your next appointment, please call your pharmacy*   Lab Work: TSH today   If you have labs (blood work) drawn today and your tests are completely normal, you will receive your results only by: [place] (if you have MyChart) OR A paper copy in the mail If you have any lab test that is abnormal or we need to change your treatment, we will call you to review the results.   Testing/Procedures: Echocardiogram - Your physician has requested that you have an echocardiogram. Echocardiography is a painless test that uses sound waves to create images of your heart. It provides your doctor with information about the size and shape of your heart and how well your heart's chambers and valves are working. This procedure takes approximately one hour. There are no restrictions for this procedure.  '   Follow-Up: At [facility], you and your health needs are our priority.  As part of our continuing mission to provide you with exceptional heart care, we have created designated Provider Care Teams.  These Care Teams include your primary Cardiologist (physician) and Advanced Practice Providers (APPs -  Physician Assistants and Nurse Practitioners) who all work together to provide you with the care you need, when you need it.  We recommend signing up for the patient portal called "MyChart".  Sign up information is provided on this After Visit Summary.  MyChart is used to connect with patients for Virtual Visits (Telemedicine).  Patients are able to view lab/test results, encounter notes, upcoming appointments, etc.  Non-urgent messages can be sent to your provider as well.   To learn more about what you can do with MyChart, go to [email or web address].    Your next appointment:   4 month(s)  The format for your next  appointment:   In Person  Provider:   Minus Breeding, MD

## 2022-02-01 LAB — TSH: TSH: 1.63 u[IU]/mL (ref 0.450–4.500)

## 2022-02-01 NOTE — Telephone Encounter (Signed)
PCC's, is there an update on this? Has respiratory called back yet? Thanks.

## 2022-02-04 NOTE — Telephone Encounter (Signed)
I have left a second vm with [facility] to schedule ABG and sent a staff message to Doris Cheadle in [facility] today to schedule.

## 2022-02-04 NOTE — Telephone Encounter (Signed)
Patient's daughter called to check on status- spoke with Judeen Hammans at our office who has not heard an update regarding scheduling. Judeen Hammans will reach out again to [facility].  Patient's daughter wanted to make sure Dr. [name] is aware of the delay in scheduling.

## 2022-02-06 ENCOUNTER — Other Ambulatory Visit: Payer: Self-pay

## 2022-02-06 ENCOUNTER — Telehealth: Payer: Self-pay

## 2022-02-06 ENCOUNTER — Ambulatory Visit (HOSPITAL_COMMUNITY)
Admission: RE | Admit: 2022-02-06 | Discharge: 2022-02-06 | Disposition: A | Discharge status: Home / Self Care | Payer: Medicare Other | Source: Ambulatory Visit | Attending: Pulmonary Disease | Admitting: Pulmonary Disease

## 2022-02-06 DIAGNOSIS — J441 Chronic obstructive pulmonary disease with (acute) exacerbation: Secondary | ICD-10-CM

## 2022-02-06 DIAGNOSIS — J9612 Chronic respiratory failure with hypercapnia: Secondary | ICD-10-CM | POA: Insufficient documentation

## 2022-02-06 DIAGNOSIS — J9611 Chronic respiratory failure with hypoxia: Secondary | ICD-10-CM

## 2022-02-06 LAB — BLOOD GAS, ARTERIAL
Acid-Base Excess: 15.1 mmol/L — ABNORMAL HIGH (ref 0.0–2.0)
Bicarbonate: 41.5 mmol/L — ABNORMAL HIGH (ref 20.0–28.0)
Drawn by: 24910
O2 Saturation: 94.3 %
Patient temperature: 37
pCO2 arterial: 57 mmHg — ABNORMAL HIGH (ref 32–48)
pH, Arterial: 7.47 — ABNORMAL HIGH (ref 7.35–7.45)
pO2, Arterial: 65 mmHg — ABNORMAL LOW (ref 83–108)

## 2022-02-06 NOTE — Progress Notes (Signed)
Patient in today for ABG on 3 LPM [place].  Results in computer

## 2022-02-10 ENCOUNTER — Other Ambulatory Visit: Payer: Self-pay | Admitting: Pulmonary Disease

## 2022-02-14 ENCOUNTER — Telehealth: Payer: Self-pay | Admitting: Pulmonary Disease

## 2022-02-14 NOTE — Telephone Encounter (Signed)
I called Melissa and left her a message to call back.

## 2022-02-14 NOTE — Telephone Encounter (Signed)
Melissa w/[employer] called to inform the nurse that they are working on the NIV order and stated that they need the Risk of Harm statement in a note. Please call to discuss further at [phone]

## 2022-02-15 ENCOUNTER — Ambulatory Visit (HOSPITAL_COMMUNITY): Payer: Medicare Other | Attending: Cardiology

## 2022-02-15 DIAGNOSIS — I5032 Chronic diastolic (congestive) heart failure: Secondary | ICD-10-CM | POA: Diagnosis not present

## 2022-02-15 NOTE — Telephone Encounter (Signed)
Spoke with Melissa- Dr [name]- this dot phrase I copied below needs to be added to your last visit please and also state why they need NIV. Thanks.     Patient's chronic respiratory failure due to - is life threatening.  Previous ABG's have documented high PCO2 and spirometry reveals severe obstructive ventilatory defect.  Patient would benefit from non-invasive ventilation.  Without this therapy, the patient is at high risk of ending up with worsening symptoms, worsened respiratory failure, need for ER visits and/or recurrent hospitalizations.  Bilevel device unable to adequately support patient's nocturnal ventilation needs.  Patient would benefit from NIV therapy with set tidal volumes and pressure.

## 2022-02-16 ENCOUNTER — Other Ambulatory Visit: Payer: Self-pay | Admitting: Internal Medicine

## 2022-02-18 NOTE — Telephone Encounter (Signed)
Community msg to [employer] to let them know Rebecca Mcintyre has updated note

## 2022-02-19 LAB — ECHOCARDIOGRAM COMPLETE
Area-P 1/2: 3.91 cm2
S' Lateral: 1.8 cm

## 2022-02-26 DIAGNOSIS — J9612 Chronic respiratory failure with hypercapnia: Secondary | ICD-10-CM | POA: Diagnosis not present

## 2022-02-26 DIAGNOSIS — J9611 Chronic respiratory failure with hypoxia: Secondary | ICD-10-CM | POA: Diagnosis not present

## 2022-02-26 DIAGNOSIS — J449 Chronic obstructive pulmonary disease, unspecified: Secondary | ICD-10-CM | POA: Diagnosis not present

## 2022-03-09 ENCOUNTER — Encounter: Payer: Self-pay | Admitting: Pulmonary Disease

## 2022-03-11 ENCOUNTER — Other Ambulatory Visit: Payer: Self-pay | Admitting: Internal Medicine

## 2022-03-11 ENCOUNTER — Telehealth: Payer: Self-pay | Admitting: Pharmacist

## 2022-03-11 NOTE — Progress Notes (Unsigned)
 Chronic Care Management Pharmacy Note  03/12/2022 Name:  Rebecca Mcintyre MRN:  [id number] DOB:  06/08/1945  Summary: LDL not at goal < 55 A1c not at goal < 7%  Recommendations/Changes made from today's visit: -Recommend switching simvastatin to rosuvastatin 20 mg for further LDL lowering -Recommend repeat uric acid level and DEXA -Recommend annual low dose CT scan  Plan: Follow up after PCP visit   Subjective: Rebecca Mcintyre is an 76 y.o. year old female who is a primary patient of Panosh, Wanda K, MD.  The CCM team was consulted for assistance with disease management and care coordination needs.    Engaged with patient by telephone for follow up visit in response to provider referral for pharmacy case management and/or care coordination services.   Consent to Services:  The patient was given information about Chronic Care Management services, agreed to services, and gave verbal consent prior to initiation of services.  Please see initial visit note for detailed documentation.   Patient Care Team: Panosh, Wanda K, MD as PCP - General Hochrein, James, MD as PCP - Cardiology (Cardiology) Richardson, Kathy, MD as Attending Physician (Obstetrics and Gynecology) Perry, John N, MD as Attending Physician (Gastroenterology) Gould, Sigmund, MD as Consulting Physician (Ophthalmology) Hochrein, James, MD as Consulting Physician (Cardiology) Alva, Rakesh V, MD as Consulting Physician (Pulmonary Disease) Rosen, Jefry, MD as Consulting Physician (Otolaryngology) Pryor, Madeline G, RPH as Pharmacist (Pharmacist)  Recent office visits: 05/22/21 Wanda Panosh, MD: Patient presented for rash. Prescribed doxycycline and valacyclovir if needed.  04/10/21 Wanda Panosh, MD: Patient presented for annual exam. No med changes.  03/30/21 Laura Viers, LPN: Patient presented for AWV.  02/20/21 Wanda Panosh MD (PCP) - seen for respiratory tract infection due to COVID-19 virus. Patient started on  Nirmatrelvir- ritonavir 10 x 150mg & 10 x 100mg 2 tablets 2 times daily. Return in 2 days or as needed.   12/25/20 Wanda Panosh, MD: Patient presented for DM follow up. Plan to repeat potassium. Prescribed ketoconazole.   12/05/20 Wanda Panosh, MD: Patient presented for hospital follow up.   Recent consult visits: 01/31/22 James Hochrein, MD (cardiology): Patient presented for SOB and CAD follow up. No medication changes. Follow up in 4 months.  01/14/22 Chi Ellison, MD (pulmonary): Patient presented for acute visit for weakness. Stopped prednisone and ordered lab work.  01/23/22 Rakesh Alva, MD (pulmonary): Patient presented for COPD follow up. Referred to [facility] for Zephry valve evaluation. Follow up in 3 months.  10/03/2021 Rakesh Alva MD (pulmonary) - Patient was seen for Centrilobular emphysema and additional issues. Started Prednisone taper and Breztri 160-9-4.8 2 puffs twice daily. Discontinued Singulair.  08/15/2021 Pramod Sethi MD (neurology) - Patient was seen for brain aneurysm. Discontinued Trelegy Ellipta. Follow up in 1 year.    02/02/21 Meghan Skotnicki, DO (ENT): Patient presented for blood in ear canal. Prescribed ofloxacin 0.3% otic solution.   01/16/21 Pramod Sethi, MD (neurology): Patient presented for initial visit for cerebral aneurysm. Recommended repeat imaging in 6 months.  Hospital visits: None in previous 6 months   Objective:  Lab Results  Component Value Date   CREATININE 1.00 01/14/2022   BUN 33 (H) 01/14/2022   GFR 55.20 (L) 01/14/2022   GFRNONAA >60 12/01/2020   GFRAA >60 11/12/2018   NA 139 01/14/2022   K 3.9 01/14/2022   CALCIUM 9.4 01/14/2022   CO2 37 (H) 01/14/2022   GLUCOSE 121 (H) 01/14/2022    Lab Results  Component Value Date/Time   HGBA1C 7.5 (  H) 04/10/2021 10:25 AM   HGBA1C 7.4 (H) 10/03/2020 09:42 AM   GFR 55.20 (L) 01/14/2022 03:22 PM   GFR 52.76 (L) 10/03/2021 11:26 AM   MICROALBUR 2.8 (H) 03/27/2020 08:06 AM   MICROALBUR 0.7 04/02/2019  09:05 AM    Last diabetic Eye exam:  Lab Results  Component Value Date/Time   HMDIABEYEEXA No Retinopathy 05/10/2021 12:00 AM    Last diabetic Foot exam: No results found for: "HMDIABFOOTEX"   Lab Results  Component Value Date   CHOL 167 01/29/2022   HDL 43 01/29/2022   LDLCALC 99 01/29/2022   LDLDIRECT 116.0 02/05/2019   TRIG 139 01/29/2022   CHOLHDL 3.9 01/29/2022       Latest Ref Rng & Units 10/03/2021   11:26 AM 11/28/2020    3:44 PM 10/03/2020    9:42 AM  Hepatic Function  Total Protein 6.0 - 8.3 g/dL 7.8  6.3  6.9   Albumin 3.5 - 5.2 g/dL 4.3  3.1  4.0   AST 0 - 37 U/L 21  28  25   ALT 0 - 35 U/L 17  25  22   Alk Phosphatase 39 - 117 U/L 80  67  80   Total Bilirubin 0.2 - 1.2 mg/dL 0.5  0.7  0.5   Bilirubin, Direct 0.0 - 0.3 mg/dL   0.1     Lab Results  Component Value Date/Time   TSH 1.630 01/31/2022 12:33 PM   TSH 3.354 11/29/2020 12:03 AM   TSH 3.37 10/03/2020 09:42 AM   FREET4 0.81 10/03/2020 09:42 AM       Latest Ref Rng & Units 01/14/2022    3:22 PM 10/03/2021   11:26 AM 11/28/2020    4:18 PM  CBC  WBC 4.0 - 10.5 K/uL 9.1  8.1    Hemoglobin 12.0 - 15.0 g/dL 13.6  15.3  13.6   Hematocrit 36.0 - 46.0 % 42.9  47.2  40.0   Platelets 150.0 - 400.0 K/uL 241.0  222.0      No results found for: "VD25OH"  Clinical ASCVD: Yes  The ASCVD Risk score (Arnett DK, et al., 2019) failed to calculate for the following reasons:   The patient has a prior MI or stroke diagnosis       12/24/2021    2:40 PM 05/22/2021    2:38 PM 04/06/2021   10:21 AM  Depression screen PHQ 2/9  Decreased Interest 0 0 0  Down, Depressed, Hopeless 0 0 0  PHQ - 2 Score 0 0 0  Altered sleeping  0   Tired, decreased energy  0   Change in appetite  0   Feeling bad or failure about yourself   0   Trouble concentrating  0   Moving slowly or fidgety/restless  0   Suicidal thoughts  0   PHQ-9 Score  0       Social History   Tobacco Use  Smoking Status Former   Packs/day: 1.00    Years: 39.00   Total pack years: 39.00   Types: Cigarettes   Quit date: 11/04/2018   Years since quitting: 3.3  Smokeless Tobacco Never   BP Readings from Last 3 Encounters:  01/31/22 101/61  01/23/22 126/76  01/14/22 (!) 140/60   Pulse Readings from Last 3 Encounters:  01/31/22 83  01/23/22 83  01/14/22 81   Wt Readings from Last 3 Encounters:  01/31/22 239 lb 9.6 oz (108.7 kg)  01/23/22 241 lb 3.2 oz (109.4 kg)    01/14/22 244 lb (110.7 kg)   BMI Readings from Last 3 Encounters:  01/31/22 42.44 kg/m  01/23/22 42.73 kg/m  01/14/22 43.22 kg/m    Assessment/Interventions: Review of patient past medical history, allergies, medications, health status, including review of consultants reports, laboratory and other test data, was performed as part of comprehensive evaluation and provision of chronic care management services.   SDOH:  (Social Determinants of Health) assessments and interventions performed: Yes  SDOH Interventions    Flowsheet Row Chronic Care Management from 03/12/2022 in [place] at [place] Patient Outreach Telephone from 12/24/2021 in [place] Management from 04/06/2021 in [place] [facility] [place] from 03/30/2021 in [place] at [place] Management from 02/07/2021 in [place] at [place] Management from 11/10/2019 in [place] at [place] Interventions -- Intervention Not Indicated Intervention Not Indicated Intervention Not Indicated -- --  Housing Interventions -- Intervention Not Indicated Intervention Not Indicated Intervention Not Indicated -- --  Transportation Interventions Intervention Not Indicated Intervention Not Indicated Intervention Not Indicated Intervention Not Indicated -- Intervention Not Indicated  Financial Strain Interventions -- -- --  Actuary with  pharmacist for assistance with Farxiga] [id number] Referral Other (Comment)  [working on Farxiga patient assistance] --  Physical Activity Interventions -- -- -- Intervention Not Indicated -- --  Stress Interventions -- -- Intervention Not Indicated Intervention Not Indicated -- Intervention Not Indicated  Social Connections Interventions -- -- -- Intervention Not Indicated -- --       SDOH Screenings   Food Insecurity: No Food Insecurity (12/24/2021)  Housing: Low Risk  (12/24/2021)  Transportation Needs: No Transportation Needs (03/12/2022)  Alcohol Screen: Low Risk  (03/30/2021)  Depression (PHQ2-9): Low Risk  (12/24/2021)  Financial Resource Strain: Low Risk  (04/06/2021)  Recent Concern: Financial Resource Strain - Medium Risk (03/30/2021)  Physical Activity: Insufficiently Active (03/30/2021)  Social Connections: Moderately Isolated (03/30/2021)  Stress: No Stress Concern Present (04/06/2021)  Tobacco Use: Medium Risk (01/31/2022)    CCM Care Plan  Allergies  Allergen Reactions   Atorvastatin     REACTION: myalgias  and liver    Medications Reviewed Today     Reviewed by Viona Gilmore, [facility] (Pharmacist) on 03/12/22 at 50  Med List Status: <None>   Medication Order Taking? Sig Documenting Provider Last Dose Status Informant  ACCU-CHEK GUIDE test strip [id number]  USE AS DIRECTED TWICE A DAY Panosh, Standley Brooking, MD  Active   Accu-Chek Softclix Lancets lancets [id number] No Use as instructed twice a day Diagnosis E11.9 Panosh, Standley Brooking, MD Taking Active   acetaminophen (TYLENOL) 500 MG tablet [id number] No Take 1,000 mg by mouth every 6 (six) hours as needed for mild pain. [provider] Taking Active Child  albuterol (PROVENTIL) (2.5 MG/3ML) 0.083% nebulizer solution [id number] No Take 3 mLs (2.5 mg total) by nebulization every 6 (six) hours as needed for wheezing or shortness of breath. Parrett, Fonnie Mu, NP Taking Active Child  albuterol (VENTOLIN HFA) 108 (90 Base) MCG/ACT  inhaler [id number] No USE 2 PUFFS EVERY 6 HOURS AS NEEDED FOR WHEEZE OR SHORTNESS OF Willia Craze, MD Taking Active   allopurinol (ZYLOPRIM) 100 MG tablet [id number] No TAKE 1 TABLET BY MOUTH TWICE A DAY Panosh, Standley Brooking, MD Taking Active   aspirin 81 MG tablet [id number] No Take 81 mg by mouth daily. [provider] Taking Active Child  Blood Glucose Monitoring Suppl (ACCU-CHEK GUIDE) w/Device KIT [id number] No Use as directed twice a day Diagnosis E11.9 Panosh, Wanda K, MD Taking Active   Budeson-Glycopyrrol-Formoterol (BREZTRI AEROSPHERE) 160-9-4.8 MCG/ACT AERO [id number] No Inhale 2 puffs into the lungs in the morning and at bedtime. Alva, Rakesh V, MD Taking Active   Budeson-Glycopyrrol-Formoterol (BREZTRI AEROSPHERE) 160-9-4.8 MCG/ACT AERO [id number] No Inhale 2 puffs into the lungs in the morning and at bedtime. Alva, Rakesh V, MD Taking Active   dapagliflozin propanediol (FARXIGA) 5 MG TABS tablet [id number] No Take 1 tablet (5 mg total) by mouth daily before breakfast. Panosh, Wanda K, MD Taking Active   furosemide (LASIX) 40 MG tablet [id number] No TAKE 1 TABLET BY MOUTH EVERY DAY Panosh, Wanda K, MD Taking Active   ketoconazole (NIZORAL) 2 % cream [id number] No Apply 1 application topically 2 (two) times daily. Panosh, Wanda K, MD Taking Active   KLOR-CON M10 10 MEQ tablet [id number] No TAKE 1 TABLET BY MOUTH TWICE A DAY Panosh, Wanda K, MD Taking Active   metFORMIN (GLUCOPHAGE) 500 MG tablet [id number]  TAKE 1 TABLET BY MOUTH TWICE A DAY WITH A MEAL INCREASE TO 2 TABLETS TWICE A DAY WITH A MEAL Panosh, Wanda K, MD  Active   metoprolol tartrate (LOPRESSOR) 25 MG tablet [id number] No TAKE 1/2 TABLETS BY MOUTH 2 TIMES DAILY Panosh, Wanda K, MD Taking Active   nitroGLYCERIN (NITROSTAT) 0.4 MG SL tablet [id number] No PLACE 1 TABLET UNDER THE TONGUE EVERY 5 MINUTES AS NEEDED. Panosh, Wanda K, MD Taking Active Child  rOPINIRole (REQUIP) 0.5 MG tablet [id number]  TAKE 1 TABLET AT BEDTIME FOR  RESTLESS LEG Alva, Rakesh V, MD  Active   simvastatin (ZOCOR) 40 MG tablet [id number] No TAKE 1 TABLET BY MOUTH EVERYDAY AT BEDTIME Panosh, Wanda K, MD Taking Active   Spacer/Aero-Hold Chamber Bags MISC [id number] No Please show patient how to use Alva, Rakesh V, MD Taking Active             Patient Active Problem List   Diagnosis Date Noted   Chronic respiratory failure with hypoxia (HCC) 07/11/2021   Elevated troponin 11/28/2020   Anterior communicating artery aneurysm 11/28/2020   Thyroid nodule 11/28/2020   Contusion of right foot    Educated about COVID-19 virus infection 08/04/2019   COPD (chronic obstructive pulmonary disease) (HCC) 12/29/2018   Restless leg syndrome 12/22/2018   Congestive heart failure (HCC) 12/13/2018   Acute on chronic diastolic CHF (congestive heart failure) (HCC) 11/05/2018   Acute CHF (congestive heart failure) (HCC) 11/04/2018   CHF (congestive heart failure) (HCC) 11/04/2018   Diabetes mellitus type II, non insulin dependent (HCC) 11/04/2018   Great toe pain, left 01/01/2018   Myocardial infarction (HCC)    Elevated hemoglobin (HCC) 04/24/2014   Visit for preventive health examination 04/24/2014   Pre-diabetes 08/12/2012   Decreased hearing 04/12/2012   Welcome to Medicare preventive visit 04/08/2012   HYPERURICEMIA 03/15/2009   GOUT 12/13/2008   OBESITY 08/23/2008   OSTEOPENIA 09/01/2007   MYOCARDIAL INFARCTION, HX OF 07/14/2007   SOLITARY KIDNEY 07/14/2007   Hyperlipidemia 04/15/2007   Essential hypertension 04/15/2007   CAD (coronary artery disease) 04/15/2007   OTHER ABNORMAL GLUCOSE 04/15/2007   TOBACCO ABUSE, HX OF 04/15/2007    Immunization History  Administered Date(s) Administered   Fluad Quad(high Dose 65+) 02/11/2019, 02/29/2020, 04/10/2021   Influenza Split 04/29/2011, 04/08/2012   Influenza Whole 02/16/2008, 03/15/2009, 03/20/2010   Influenza, High Dose Seasonal PF 04/14/2013, 02/14/2015, 02/28/2016, 03/10/2017, 03/25/2018      Influenza,inj,Quad PF,6+ Mos 04/18/2014   Influenza,inj,quad, With Preservative 02/25/2019   Influenza-Unspecified 02/24/2017   PFIZER(Purple Top)SARS-COV-2 Vaccination 06/15/2019, 07/06/2019, 04/13/2020   Pneumococcal Conjugate-13 04/08/2012   Pneumococcal Polysaccharide-23 04/14/2013   Td 09/14/2007   Unspecified SARS-COV-2 Vaccination 04/25/2021   Zoster Recombinat (Shingrix) 06/30/2021, 10/12/2021   Zoster, Live 09/14/2007   Patient is on a CPAP machine now and was having problems with expelling CO2. She is sleeping much better now and has noticed an improvement with daytime fatigue. She is not napping during the day. She is trying to get in bed by 10:30pm at night. Patient has a visit with [facility] in January for a Zephyr valve evaluation.  Patient is trying to eat better and closely watch what she is eating and eat better overall.   Conditions to be addressed/monitored:  Hypertension, Hyperlipidemia, Diabetes, Heart Failure, Coronary Artery Disease, COPD, Osteopenia, and Gout  Conditions addressed this visit: Diabetes, hyperlipidemia  Care Plan : CCM Pharmacy Care Plan  Updates made by Viona Gilmore, [place] since 03/12/2022 12:00 AM     Problem: Problem: Hypertension, Hyperlipidemia, Diabetes, Heart Failure, Coronary Artery Disease, COPD, Osteopenia, and Gout      Long-Range Goal: Patient-Specific Goal   Start Date: 02/07/2021  Expected End Date: 02/07/2022  Recent Progress: On track  Priority: High  Note:   Current Barriers:  Unable to independently monitor therapeutic efficacy Unable to achieve control of diabetes  Unable to maintain control of cholesterol  Pharmacist Clinical Goal(s):  Patient will achieve control of diabetes as evidenced by A1c maintain control of cholesterol as evidenced by next lipid panel  through collaboration with PharmD and provider.   Interventions: 1:1 collaboration with Panosh, Standley Brooking, MD regarding development and update of comprehensive  plan of care as evidenced by provider attestation and co-signature Inter-disciplinary care team collaboration (see longitudinal plan of care) Comprehensive medication review performed; medication list updated in electronic medical record  Hypertension (BP goal <130/80) -Controlled -Current treatment: Furosemide 40 mg 1 tablet daily - Appropriate, Effective, Safe, Accessible Metoprolol 25 mg 0.5 tablet by mouth twice daily - Appropriate, Effective, Safe, Accessible -Medications previously tried: n/a  -Current home readings: 105/70; checking every day; uses an arm cuff; highest is at the doctor's office -Current dietary habits: did not discuss -Current exercise habits: limited with shortness of breath -Denies hypotensive/hypertensive symptoms -Educated on BP goals and benefits of medications for prevention of heart attack, stroke and kidney damage; Importance of home blood pressure monitoring; Proper BP monitoring technique; -Counseled to monitor BP at home weekly, document, and provide log at future appointments -Counseled on diet and exercise extensively Recommended to continue current medication  Hyperlipidemia: (LDL goal < 55) -Uncontrolled -Current treatment: Simvastatin 40 mg 1 tablet daily at bedtime - Appropriate, Query effective, Safe, Accessible -Medications previously tried: atorvastatin (myalgias), Crestor (cost when name brand) -Current dietary patterns: did not discuss -Current exercise habits: limited with shortness of breath -Educated on Cholesterol goals;  Importance of limiting foods high in cholesterol; Exercise goal of 150 minutes per week; -Counseled on diet and exercise extensively Recommended switching simvastatin to rosuvastatin 20 mg.   Diabetes (A1c goal <7%) -Uncontrolled -Current medications: Farxiga 10 mg 1 tablet daily - Appropriate, Query effective, Safe, Accessible Metformin 500 mg 2 tablets by mouth twice daily with meals - Appropriate, Query  effective, Safe, Accessible -Medications previously tried: n/a  -Current home glucose readings fasting glucose: 116, 92 (lowest) post prandial glucose: does not check Before bedtime: 161 (after eating certain foods) -Denies hypoglycemic/hyperglycemic  symptoms -Current meal patterns:  breakfast: cottage cheese, banana or yogurt and fruit  or yogurt and blueberries lunch: biggest meal; baked chicken, red slaw, collards; manicotti and a salad; lots of beans; lots of salmon dinner: soup or PB crackers snacks: not often; couple of cookies yesterday; slushies drinks: majority water, diet coke; hot tea with lemon; orange juice, 2 times a week with coffee -Current exercise: uses an app that keeps up with her walking; walks two times a day (6 minutes) and some simple exercises -Educated on A1c and blood sugar goals; Exercise goal of 150 minutes per week; Carbohydrate counting and/or plate method -Counseled to check feet daily and get yearly eye exams -Counseled on diet and exercise extensively Recommended to continue current medication  CAD (Goal: prevent heart events) -Controlled -Current treatment  Nitrostat 0.4 mg SL 1 tablet under tongue every 5 mins as needed  - Appropriate, Effective, Safe, Accessible Aspirin 81 mg 1 tablet daily - Appropriate, Effective, Safe, Accessible Simvastatin 40 mg 1 tablet daily at bedtime - Appropriate, Effective, Safe, Accessible -Medications previously tried: none  -Recommended to continue current medication   Heart Failure (Goal: manage symptoms and prevent exacerbations) -Controlled -Last ejection fraction: 60-65% (Date: 11/2020) -HF type: Systolic -NYHA Class: II (slight limitation of activity) -AHA HF Stage: C (Heart disease and symptoms present) -Current treatment: Furosemide 40 mg 1 tablet daily as needed - Appropriate, Effective, Safe, Accessible Metoprolol 25 mg 0.5 tablet twice daily - Appropriate, Effective, Safe, Accessible Farxiga 10 mg 1  tablet daily  -Appropriate, Effective, Safe, Accessible Klor-con 20 mEq 1 tablet daily as needed with furosemide - Appropriate, Effective, Safe, Accessible -Medications previously tried: n/a  -Current home BP/HR readings: refer to above -Current dietary habits: did not discuss -Current exercise habits: refer to above -Educated on Importance of weighing daily; if you gain more than 3 pounds in one day or 5 pounds in one week, call cardiologist. Importance of blood pressure control -Counseled on diet and exercise extensively Recommended to continue current medication  COPD (Goal: control symptoms and prevent exacerbations) -Controlled -Current treatment  Albuterol HFA 2 puffs into the lungs every 6 hrs as needed for wheezing or shortness of breath - Appropriate, Effective, Safe, Accessible Breztri 2 puffs twice daily - Appropriate, Effective, Safe, Accessible -Medications previously tried: Anoro , Trelegy (cost), montelukast -Gold Grade: Gold 2 (FEV1 50-79%) -Current COPD Classification:  B (high sx, <2 exacerbations/yr) -MMRC/CAT score: 3 -Pulmonary function testing: 2020 -Exacerbations requiring treatment in last 6 months: none -Patient reports consistent use of maintenance inhaler -Frequency of rescue inhaler use: almost every day -Counseled on Benefits of consistent maintenance inhaler use When to use rescue inhaler Differences between maintenance and rescue inhalers -Counseled on diet and exercise extensively Recommended to continue current medication Patient is in a COPD study.  Osteopenia (Goal prevent fractures) -Not ideally controlled -Last DEXA Scan: could not locate   T-Score femoral neck: n/a  T-Score total hip: n/a  T-Score lumbar spine: n/a  T-Score forearm radius: n/a  10-year probability of major osteoporotic fracture: n/a  10-year probability of hip fracture: n/a -Patient is not a candidate for pharmacologic treatment -Current treatment  No  medications -Medications previously tried: none  -Recommend weight-bearing and muscle strengthening exercises for building and maintaining bone density. -Recommended repeat DEXA.  Gout (Goal: uric acid <6 and prevent flare ups) -Not ideally controlled -Current treatment  Allopurinol 100 mg 1 tablet twice daily - Appropriate, Query effective, Safe, Accessible -Medications previously tried: none -Recommended repeat uric acid   level.  Restless legs syndrome (Goal: minimize symptoms) -Controlled -Current treatment  Ropinirole 0.5 mg start 0.5 tablet at bedtime for 2 nights, then increase to 1 tablet at bedtime - Appropriate, Effective, Safe, Accessible -Medications previously tried: none  -Recommended to continue current medication   Health Maintenance -Vaccine gaps: shingrix, tetanus, influenza, COVID booster -Current therapy:  APAP 500 mg 2 tablets every 6 hrs as needed for mild pain Hydrocortisone 2.5% cream twice daily Ketoconazole 2% cream twice daily -Educated on Cost vs benefit of each product must be carefully weighed by individual consumer -Patient is satisfied with current therapy and denies issues -Recommended to continue current medication  Patient Goals/Self-Care Activities Patient will:  - take medications as prescribed check glucose daily, document, and provide at future appointments check blood pressure weekly, document, and provide at future appointments target a minimum of 150 minutes of moderate intensity exercise weekly  Follow Up Plan: The care management team will reach out to the patient again over the next 30 days.         Medication Assistance:  Breztri and Farxiga obtained through [place] medication assistance program.  Enrollment ends 05/26/22  Compliance/Adherence/Medication fill history: Care Gaps: Tetanus, foot exam, microalbumin, A1c, COVID booster, influenza, colonoscopy, mammogram Last BP - 101/61 on 01/31/2022 Last A1C - 7.5 on  04/10/2022 [facility]: Dapagliflozin 5 mg - filled through PAP Metformin 500mg - last filled [facility]/22/2022 [facility] Simvastatin 40mg - last filled 12/21/2021 [facility]  Patient's preferred pharmacy is:  [id number] - [place], [place] - [address]  [place] [address] Phone: [phone] Fax: [phone]  [facility] - [place], [place] - [address] [place] [address] Phone: [phone] Fax: [phone]   Uses pill box? Yes Pt endorses 100% compliance  We discussed: Current pharmacy is preferred with insurance plan and patient is satisfied with pharmacy services Patient decided to: Continue current medication management strategy  Care Plan and Follow Up Patient Decision:  Patient agrees to Care Plan and Follow-up.  Plan: Telephone follow up appointment with care management team member scheduled for:  4 months  Madeline Pryor, PharmD, BCACP Clinical Pharmacist [facility] [place] [phone]   

## 2022-03-11 NOTE — Chronic Care Management (AMB) (Signed)
    Chronic Care Management Pharmacy Assistant   Name: NERY KALISZ  MRN: [id number] DOB: 07/22/1945  03/12/2022 APPOINTMENT REMINDER  Called [name], No answer, left message of appointment on 03/12/2022 at 10:30 via telephone visit with Jeni Salles, Pharm D. Notified to have all medications, supplements, blood pressure and/or blood sugar logs available during appointment and to return call if need to reschedule.  Care Gaps: AWV - 03/11/22 message sent to Ramond Craver Last BP - 101/61 on 01/31/2022 Last A1C - 7.5 on 04/10/2021 Tdap - overdue Urine ACR - overdue Foot exam - overdue Covid - overdue HGA1C - overdue Flu - due Colonoscopy - overdue Mammogram - overdue  Star Rating Drug: Dapagliflozin 5 mg - filled through PAP Metformin 533m - last filled 02/22/2022 [facility] Simvastatin 428m- last filled 12/21/2021 [facility]  Any gaps in medications fill history? No  JaGennie AlmaMThe Neuromedical Center Rehabilitation HospitalClCatering [phone]

## 2022-03-12 ENCOUNTER — Other Ambulatory Visit: Payer: Self-pay

## 2022-03-12 ENCOUNTER — Ambulatory Visit (INDEPENDENT_AMBULATORY_CARE_PROVIDER_SITE_OTHER): Payer: Medicare Other | Admitting: Pharmacist

## 2022-03-12 ENCOUNTER — Encounter: Payer: Self-pay | Admitting: Internal Medicine

## 2022-03-12 DIAGNOSIS — E2839 Other primary ovarian failure: Secondary | ICD-10-CM

## 2022-03-12 DIAGNOSIS — I10 Essential (primary) hypertension: Secondary | ICD-10-CM

## 2022-03-12 DIAGNOSIS — E1165 Type 2 diabetes mellitus with hyperglycemia: Secondary | ICD-10-CM

## 2022-03-12 MED ORDER — DAPAGLIFLOZIN PROPANEDIOL 5 MG PO TABS: 5.0000 mg | ORAL_TABLET | Freq: Every day | ORAL | 3 refills | Status: DC

## 2022-03-12 NOTE — Patient Instructions (Signed)
Hi Rebecca Mcintyre,  It was great to catch up with you again! Please let me know if you need a refill on the [place] sent to the patient assistance pharmacy.  Please reach out to me if you have any questions or need anything !  Best, Maddie  Jeni Salles, PharmD, [facility]  Visit Information   Goals Addressed   None    Patient Care Plan: CCM Pharmacy Care Plan     Problem Identified: Problem: Hypertension, Hyperlipidemia, Diabetes, Heart Failure, Coronary Artery Disease, COPD, Osteopenia, and Gout      Long-Range Goal: Patient-Specific Goal   Start Date: 02/07/2021  Expected End Date: 02/07/2022  Recent Progress: On track  Priority: High  Note:   Current Barriers:  Unable to independently monitor therapeutic efficacy Unable to achieve control of diabetes  Unable to maintain control of cholesterol  Pharmacist Clinical Goal(s):  Patient will achieve control of diabetes as evidenced by A1c maintain control of cholesterol as evidenced by next lipid panel  through collaboration with PharmD and provider.   Interventions: 1:1 collaboration with Panosh, Standley Brooking, MD regarding development and update of comprehensive plan of care as evidenced by provider attestation and co-signature Inter-disciplinary care team collaboration (see longitudinal plan of care) Comprehensive medication review performed; medication list updated in electronic medical record  Hypertension (BP goal <130/80) -Controlled -Current treatment: Furosemide 40 mg 1 tablet daily - Appropriate, Effective, Safe, Accessible Metoprolol 25 mg 0.5 tablet by mouth twice daily - Appropriate, Effective, Safe, Accessible -Medications previously tried: n/a  -Current home readings: 105/70; checking every day; uses an arm cuff; highest is at the doctor's office -Current dietary habits: did not discuss -Current exercise habits: limited with shortness of breath -Denies  hypotensive/hypertensive symptoms -Educated on BP goals and benefits of medications for prevention of heart attack, stroke and kidney damage; Importance of home blood pressure monitoring; Proper BP monitoring technique; -Counseled to monitor BP at home weekly, document, and provide log at future appointments -Counseled on diet and exercise extensively Recommended to continue current medication  Hyperlipidemia: (LDL goal < 55) -Uncontrolled -Current treatment: Simvastatin 40 mg 1 tablet daily at bedtime - Appropriate, Query effective, Safe, Accessible -Medications previously tried: atorvastatin (myalgias), Crestor (cost when name brand) -Current dietary patterns: did not discuss -Current exercise habits: limited with shortness of breath -Educated on Cholesterol goals;  Importance of limiting foods high in cholesterol; Exercise goal of 150 minutes per week; -Counseled on diet and exercise extensively Recommended switching simvastatin to rosuvastatin 20 mg.   Diabetes (A1c goal <7%) -Uncontrolled -Current medications: Farxiga 10 mg 1 tablet daily - Appropriate, Query effective, Safe, Accessible Metformin 500 mg 2 tablets by mouth twice daily with meals - Appropriate, Query effective, Safe, Accessible -Medications previously tried: n/a  -Current home glucose readings fasting glucose: 116, 92 (lowest) post prandial glucose: does not check Before bedtime: 482 (after eating certain foods) -Denies hypoglycemic/hyperglycemic symptoms -Current meal patterns:  breakfast: cottage cheese, banana or yogurt and fruit  or yogurt and blueberries lunch: biggest meal; baked chicken, red slaw, collards; manicotti and a salad; lots of beans; lots of salmon dinner: soup or PB crackers snacks: not often; couple of cookies yesterday; slushies drinks: majority water, diet coke; hot tea with lemon; orange juice, 2 times a week with coffee -Current exercise: uses an app that keeps up with her walking;  walks two times a day (6 minutes) and some simple exercises -Educated on A1c and blood sugar goals; Exercise goal of  150 minutes per week; Carbohydrate counting and/or plate method -Counseled to check feet daily and get yearly eye exams -Counseled on diet and exercise extensively Recommended to continue current medication  CAD (Goal: prevent heart events) -Controlled -Current treatment  Nitrostat 0.4 mg SL 1 tablet under tongue every 5 mins as needed  - Appropriate, Effective, Safe, Accessible Aspirin 81 mg 1 tablet daily - Appropriate, Effective, Safe, Accessible Simvastatin 40 mg 1 tablet daily at bedtime - Appropriate, Effective, Safe, Accessible -Medications previously tried: none  -Recommended to continue current medication   Heart Failure (Goal: manage symptoms and prevent exacerbations) -Controlled -Last ejection fraction: 60-65% (Date: 11/2020) -HF type: Systolic -NYHA Class: II (slight limitation of activity) -AHA HF Stage: C (Heart disease and symptoms present) -Current treatment: Furosemide 40 mg 1 tablet daily as needed - Appropriate, Effective, Safe, Accessible Metoprolol 25 mg 0.5 tablet twice daily - Appropriate, Effective, Safe, Accessible Farxiga 10 mg 1 tablet daily  -Appropriate, Effective, Safe, Accessible Klor-con 20 mEq 1 tablet daily as needed with furosemide - Appropriate, Effective, Safe, Accessible -Medications previously tried: n/a  -Current home BP/HR readings: refer to above -Current dietary habits: did not discuss -Current exercise habits: refer to above -Educated on Importance of weighing daily; if you gain more than 3 pounds in one day or 5 pounds in one week, call cardiologist. Importance of blood pressure control -Counseled on diet and exercise extensively Recommended to continue current medication  COPD (Goal: control symptoms and prevent exacerbations) -Controlled -Current treatment  Albuterol HFA 2 puffs into the lungs every 6 hrs as needed  for wheezing or shortness of breath - Appropriate, Effective, Safe, Accessible Breztri 2 puffs twice daily - Appropriate, Effective, Safe, Accessible -Medications previously tried: [name] , Trelegy (cost), montelukast -Gold Grade: Gold 2 (FEV1 50-79%) -Current COPD Classification:  B (high sx, <2 exacerbations/yr) -MMRC/CAT score: 3 -Pulmonary function testing: 2020 -Exacerbations requiring treatment in last 6 months: none -Patient reports consistent use of maintenance inhaler -Frequency of rescue inhaler use: almost every day -Counseled on Benefits of consistent maintenance inhaler use When to use rescue inhaler Differences between maintenance and rescue inhalers -Counseled on diet and exercise extensively Recommended to continue current medication Patient is in a COPD study.  Osteopenia (Goal prevent fractures) -Not ideally controlled -Last DEXA Scan: could not locate   T-Score femoral neck: n/a  T-Score total hip: n/a  T-Score lumbar spine: n/a  T-Score forearm radius: n/a  10-year probability of major osteoporotic fracture: n/a  10-year probability of hip fracture: n/a -Patient is not a candidate for pharmacologic treatment -Current treatment  No medications -Medications previously tried: none  -Recommend weight-bearing and muscle strengthening exercises for building and maintaining bone density. -Recommended repeat DEXA.  Gout (Goal: uric acid <6 and prevent flare ups) -Not ideally controlled -Current treatment  Allopurinol 100 mg 1 tablet twice daily - Appropriate, Query effective, Safe, Accessible -Medications previously tried: none -Recommended repeat uric acid level.  Restless legs syndrome (Goal: minimize symptoms) -Controlled -Current treatment  Ropinirole 0.5 mg start 0.5 tablet at bedtime for 2 nights, then increase to 1 tablet at bedtime - Appropriate, Effective, Safe, Accessible -Medications previously tried: none  -Recommended to continue current  medication   Health Maintenance -Vaccine gaps: shingrix, tetanus, influenza, COVID booster -Current therapy:  APAP 500 mg 2 tablets every 6 hrs as needed for mild pain Hydrocortisone 2.5% cream twice daily Ketoconazole 2% cream twice daily -Educated on Cost vs benefit of each product must be carefully weighed by individual consumer -Patient  is satisfied with current therapy and denies issues -Recommended to continue current medication  Patient Goals/Self-Care Activities Patient will:  - take medications as prescribed check glucose daily, document, and provide at future appointments check blood pressure weekly, document, and provide at future appointments target a minimum of 150 minutes of moderate intensity exercise weekly  Follow Up Plan: The care management team will reach out to the patient again over the next 30 days.       Patient Care Plan: RN Care Manager Plan of Care  Completed 11/28/2021   Problem Identified: Chronic Disease Management and Care Coordination Needs (COPD,DM2,CHF,HTN) Resolved 11/28/2021  Priority: High     Long-Range Goal: Establish Plan of Care for Chronic Disease Management Needs (COPD,DM2,CHF,HTN) Completed 11/28/2021  Start Date: 04/06/2021  Expected End Date: 09/11/2022  Recent Progress: On track  Priority: High  Note:   RNCM case closed goals met Current Barriers:  Care Coordination needs related to Financial constraints related to medication in coverage gap Chronic Disease Management support and education needs related to CHF, HTN, HLD, COPD, and DMII  Financial Constraints   States she is in the doughnut hole now and she can not afford her [place] anymore.  States she did not get approved for assistance for Bretri and she will go back on Symbicort and Spiriva.States she is still doing the home pulmonary rehab through an app from [facility] that Dr. [name] has arranged for her.  States she is walking twice a day for 6 minutes and is riding her exercise  bike.  States she is doing breathing exercises and exercises with resistance bands. States she gets short of breath when she walks.  States she wears her O2 at all times.  Denies any chest pains or swelling.  States she weights daily and her weight stays around 237-240 most of the time.  States she will take a second Lasix if her weight goes up or if she has swelling in her legs as she has been directed to to do.  States she checks her CBG twice a day with fasting readings of 90-125 and at bedtime 150-180. Denies any low readings. States she is still trying to eat less CHO and more protein. States she has been following a low sodium diet more closely.  States she has not been drinking anything with sugar.   RNCM Clinical Goal(s):  Patient will verbalize basic understanding of  CHF, HTN, HLD, COPD, and DMII disease process and self health management plan as evidenced by voiced adherence to plan of care take all medications exactly as prescribed and will call provider for medication related questions as evidenced by dispense report and pt verbalization attend all scheduled medical appointments:  Pulmonary 01/23/22, Neurology 08/26/22 as evidenced by medical records demonstrate Improved adherence to prescribed treatment plan for CHF, HTN, HLD, COPD, and DMII as evidenced by readings within limits, adherence to plan of care work with pharmacist to address Financial constraints related to [place] related toCHF, HTN, HLD, COPD, and DMII as evidenced by review or EMR and patient or pharmacist report through collaboration with Consulting civil engineer, provider, and care team.   Interventions: 1:1 collaboration with primary care provider regarding development and update of comprehensive plan of care as evidenced by provider attestation and co-signature Inter-disciplinary care team collaboration (see longitudinal plan of care) Evaluation of current treatment plan related to  self management and patient's adherence to plan as  established by provider Communicated to CCM PharmD that pt is in coverage gap  and can not afford Farxiga-approved for assistance   Heart Failure Interventions:  (Status:  Goal Met.) Long Term Goal Basic overview and discussion of pathophysiology of Heart Failure reviewed Provided education on low sodium diet Reviewed Heart Failure Action Plan in depth and provided written copy Discussed importance of daily weight and advised patient to weigh and record daily Reviewed role of diuretics in prevention of fluid overload and management of heart failure; Discussed the importance of keeping all appointments with provider Reinforced to continue to follow a low sodium diet. Reinforced dry weight goals of 235-240 per provider and use of extra Lasix as needed  COPD Interventions:  (Status:  Goal Met.) Long Term Goal Advised patient to track and manage COPD triggers Provided instruction about proper use of medications used for management of COPD including inhalers Advised patient to self assesses COPD action plan zone and make appointment with provider if in the yellow zone for 48 hours without improvement Advised patient to engage in light exercise as tolerated 3-5 days a week to aid in the the management of COPD Provided education about and advised patient to utilize infection prevention strategies to reduce risk of respiratory infection Discussed the importance of adequate rest and management of fatigue with COPD Reviewed s/sx to call provider. Reinforce to continue virtual pulmonary rehab to help increase her strength and energy. Reviewed to contact Dr. [name] about which inhalers he wants her to take   Diabetes Interventions:  (Status:  Goal Met.) Long Term Goal Assessed patient's understanding of A1c goal: <7% Provided education to patient about basic DM disease process Reviewed medications with patient and discussed importance of medication adherence Counseled on importance of regular laboratory  monitoring as prescribed Discussed plans with patient for ongoing care management follow up and provided patient with direct contact information for care management team Advised patient, providing education and rationale, to check cbg twice a day and record, calling provider for findings outside established parameters Referral made to pharmacy team for assistance with cost of [place] with CCM PharmD Review of patient status, including review of consultants reports, relevant laboratory and other test results, and medications completed Communicated to CCM PharmD about pt being in coverage gap and cost of [place].Reinforced  fasting blood sugar goals of 80-130 and less than 180 1 1/2-2 hours after meals . Reinforced to follow a low carbohydrate low salt diet and to watch portion sizes Lab Results  Component Value Date   HGBA1C 7.5 (H) 04/10/2021   Hyperlipidemia Interventions:  (Status:  Goal Met.) Long Term Goal Medication review performed; medication list updated in electronic medical record.  Counseled on importance of regular laboratory monitoring as prescribed Reviewed role and benefits of statin for ASCVD risk reduction Reviewed importance of limiting foods high in cholesterol  Hypertension Interventions:  (Status:  Goal Met.) Long Term Goal Last practice recorded BP readings:  BP Readings from Last 3 Encounters:  10/03/21 130/60  08/15/21 110/64  07/11/21 132/66  Most recent eGFR/CrCl:  Lab Results  Component Value Date   EGFR 74 07/05/2021    No components found for: CRCL  Evaluation of current treatment plan related to hypertension self management and patient's adherence to plan as established by provider Provided education to patient re: stroke prevention, s/s of heart attack and stroke Discussed plans with patient for ongoing care management follow up and provided patient with direct contact information for care management team Advised patient, providing education and  rationale, to monitor blood pressure daily and record, calling PCP  for findings outside established parameters Provided education on prescribed diet low sodium low CHO  Discussed complications of poorly controlled blood pressure such as heart disease, stroke, circulatory complications, vision complications, kidney impairment, sexual dysfunction Reinforced to continue increasing exercise with pulmonary rehab as tolerated     Health Maintenance Interventions:  (Status:  Goal Met.) Short Term Goal Historical Immunization for documented 1st Shingrix shot Patient provided CDC guideline information about Shingles vaccine    Received  2nd Shingrix shot at least 2 months after first vaccine    Patient Goals/Self-Care Activities: Take all medications as prescribed Attend all scheduled provider appointments Call pharmacy for medication refills 3-7 days in advance of running out of medications Perform all self care activities independently  Call provider office for new concerns or questions  call office if I gain more than 2 pounds in one day or 5 pounds in one week keep legs up while sitting watch for swelling in feet, ankles and legs every day weigh myself daily follow rescue plan if symptoms flare-up eat more whole grains, fruits and vegetables, lean meats and healthy fats track symptoms and what helps feel better or worse dress right for the weather, hot or cold keep appointment with eye doctor check blood sugar at prescribed times: twice daily and when you have symptoms of low or high blood sugar check feet daily for cuts, sores or redness fill half of plate with vegetables manage portion size switch to sugar-free drinks keep feet up while sitting wash and dry feet carefully every day identify and remove indoor air pollutants limit outdoor activity during cold weather do breathing exercises every day attend pulmonary rehabilitation follow rescue plan if symptoms flare-up don't eat or  exercise right before bedtime get at least 7 to 8 hours of sleep at night check blood pressure daily choose a place to take my blood pressure (home, clinic or office, retail store) take blood pressure log to all doctor appointments call doctor for signs and symptoms of high blood pressure eat more whole grains, fruits and vegetables, lean meats and healthy fats limit salt intake to 2367m/day call for medicine refill 2 or 3 days before it runs out take all medications exactly as prescribed call doctor with any symptoms you believe are related to your medicine  Follow Up Plan:  The patient has been provided with contact information for the care management team and has been advised to call with any health related questions or concerns.  No further follow up required: RNCM case closed goals met           Patient verbalizes understanding of instructions and care plan provided today and agrees to view in [facility] [place] [facility] My[facility] status and patient understanding of how to access instructions and care plan via MyChart confirmed with patient.    The pharmacy team will reach out to the patient again over the next 30 days.   MViona Gilmore RMemorial Hermann First Colony Hospital

## 2022-03-14 ENCOUNTER — Other Ambulatory Visit: Payer: Self-pay | Admitting: Pulmonary Disease

## 2022-03-14 DIAGNOSIS — Z1231 Encounter for screening mammogram for malignant neoplasm of breast: Secondary | ICD-10-CM | POA: Diagnosis not present

## 2022-03-14 LAB — HM MAMMOGRAPHY

## 2022-03-15 ENCOUNTER — Telehealth: Payer: Self-pay | Admitting: *Deleted

## 2022-03-15 DIAGNOSIS — E785 Hyperlipidemia, unspecified: Secondary | ICD-10-CM

## 2022-03-15 MED ORDER — ROSUVASTATIN CALCIUM 20 MG PO TABS: 20.0000 mg | ORAL_TABLET | Freq: Every day | ORAL | 3 refills | Status: DC

## 2022-03-15 NOTE — Telephone Encounter (Signed)
pt aware of results  New script sent to the pharmacy  Lab orders mailed to the pt

## 2022-03-15 NOTE — Telephone Encounter (Signed)
-----  Message from Minus Breeding, MD sent at 03/15/2022 11:30 AM EDT ----- Please stop the Zocor and change to Crestor 20 mg PO daily and repeat lipid in 3 months.  Thanks.  ----- Message ----- From: Burnis Medin, MD Sent: 03/15/2022  11:16 AM EDT To: Minus Breeding, MD; Rigoberto Noel, MD; #  So ok with  ordering dexa scan  I would opine for the cardiology team and the pulmonary team opine on your other advice since  they are managing her coronary disease and chronic lung disease.   We can address at the visit .otherwise . Renold Don can you order dexa scan  as requested  dx  estrogen deficiency. Thanks WP     ----- Message ----- From: Viona Gilmore, [facility] Sent: 03/12/2022  11:15 AM EDT To: Burnis Medin, MD  Hi,  You will be seeing her in 1 month for her CPE but prior to that can you possibly put in an order for a repeat DEXA for her (to do at [facility]) and her annual low dose CT scan? She only quit smoking back in 2020.  For your visit with her, can we switch her simvastatin to rosuvastatin 20 mg? Her LDL goal is < 55 with her CAD and DM and she is pretty high risk for CV events. She is also overdue for a urine microalbumin which somehow has not been completed since 2021.  Please let me know! Maddie

## 2022-03-21 ENCOUNTER — Encounter: Payer: Self-pay | Admitting: Pulmonary Disease

## 2022-03-21 ENCOUNTER — Ambulatory Visit: Payer: Medicare Other | Admitting: Pulmonary Disease

## 2022-03-21 VITALS — BP 128/66 | HR 84 | Temp 98.4°F | Ht 63.0 in | Wt 235.6 lb

## 2022-03-21 DIAGNOSIS — J432 Centrilobular emphysema: Secondary | ICD-10-CM

## 2022-03-21 DIAGNOSIS — J9611 Chronic respiratory failure with hypoxia: Secondary | ICD-10-CM

## 2022-03-21 DIAGNOSIS — Z23 Encounter for immunization: Secondary | ICD-10-CM

## 2022-03-21 DIAGNOSIS — J9612 Chronic respiratory failure with hypercapnia: Secondary | ICD-10-CM

## 2022-03-21 NOTE — Patient Instructions (Signed)
  X referral to [facility] for POC  Congratulations on using your BiPAP machine.  You are settling well

## 2022-03-21 NOTE — Progress Notes (Signed)
   Subjective:    Patient ID: Rebecca Mcintyre, female    DOB: 1945/07/20, 76 y.o.   MRN: [id number]  HPI  76 yo ex-smoker for follow-up of COPD , chronic hypoxic respiratory failure and pulmonary hypertension Identical twin of Timiya Howells ,my patient who passed away   PMH - CAD, diastolic HF,  dyslipidemia, obesity  - hospitalized 10/2018 for acute on chronic hypercarbic respiratory failure -slow to resolve COPD exacerbation 01/2020 - hospitalized 11/2020  for right lower extremity injury, course complicated by acute diastolic heart failure and encephalopathy due to narcotics elevated troponin   Last OV 12/2021 >>refer her to interventional pulmonology for Zephyr valve evaluation , she has appointment at [facility] for January referral to [facility] rep for evaluation of POC -this has not happened yet ABG >> NIV , she was started on BiPAP and eventually after a lot of back-and-forth to DME Arrives accompanied by her daughter today, she is in a wheelchair. She is trying to get used to F20 fullface mask check Eliquis POC.  Breathing is at baseline, pedal edema is increasing   Significant tests/ events reviewed  ABG 01/2022 7.4 7/57/65 on 3 L oxygen ABG 02/2020 7.4 2/44/53/87%   PFTs 12/29/2018 FVC 2.31 (82), FEV1 1.20 (56), ratio 52, no bronchodilatory response, DLCO 51 %   10/2018 CTA Chest- No PE; cardiomegaly, emphysema       NPSG 12/13/18  no obstructive sleep apnea, min O2 89%. Periodic limb movements did occur during sleep   Review of Systems neg for any significant sore throat, dysphagia, itching, sneezing, nasal congestion or excess/ purulent secretions, fever, chills, sweats, unintended wt loss, pleuritic or exertional cp, hempoptysis, orthopnea pnd or change in chronic leg swelling. Also denies presyncope, palpitations, heartburn, abdominal pain, nausea, vomiting, diarrhea or change in bowel or urinary habits, dysuria,hematuria, rash, arthralgias, visual complaints, headache, numbness weakness  or ataxia.     Objective:   Physical Exam  Gen. Pleasant, obese, in no distress ENT - no lesions, no post nasal drip Neck: No JVD, no thyromegaly, no carotid bruits Lungs: no use of accessory muscles, no dullness to percussion, decreased without rales or rhonchi  Cardiovascular: Rhythm regular, heart sounds  normal, no murmurs or gallops, 1+ peripheral edema Musculoskeletal: No deformities, no cyanosis or clubbing , no tremors       Assessment & Plan:

## 2022-03-23 ENCOUNTER — Other Ambulatory Visit: Payer: Self-pay | Admitting: Internal Medicine

## 2022-03-25 NOTE — Assessment & Plan Note (Signed)
She was added on nocturnal BiPAP for persistent hypercarbia.  Her headaches have improved and she has more energy in the daytime.  She is very compliant, I reviewed download on her phone.  BiPAP is certainly helping with her symptoms and hopefully will help to decrease hospitalization or ED visits.  Compliance was again emphasized She again request portable concentrator, we will contact [employer] directly to see if she will qualify

## 2022-03-25 NOTE — Assessment & Plan Note (Signed)
Continue Breztri. Use albuterol for rescue. We discussed COPD action plan and signs and symptoms of COPD exacerbation. We discussed vaccinations

## 2022-03-26 ENCOUNTER — Encounter (HOSPITAL_BASED_OUTPATIENT_CLINIC_OR_DEPARTMENT_OTHER): Payer: Self-pay | Admitting: Pulmonary Disease

## 2022-03-26 DIAGNOSIS — E785 Hyperlipidemia, unspecified: Secondary | ICD-10-CM | POA: Diagnosis not present

## 2022-03-26 DIAGNOSIS — I10 Essential (primary) hypertension: Secondary | ICD-10-CM | POA: Diagnosis not present

## 2022-03-26 DIAGNOSIS — E1165 Type 2 diabetes mellitus with hyperglycemia: Secondary | ICD-10-CM | POA: Diagnosis not present

## 2022-03-26 DIAGNOSIS — Z794 Long term (current) use of insulin: Secondary | ICD-10-CM | POA: Diagnosis not present

## 2022-03-27 NOTE — Addendum Note (Signed)
Addended byEncarnacion Slates on: 03/27/2022 02:47 PM   Modules accepted: Orders

## 2022-03-29 DIAGNOSIS — J9612 Chronic respiratory failure with hypercapnia: Secondary | ICD-10-CM | POA: Diagnosis not present

## 2022-03-29 DIAGNOSIS — J449 Chronic obstructive pulmonary disease, unspecified: Secondary | ICD-10-CM | POA: Diagnosis not present

## 2022-03-29 DIAGNOSIS — J9611 Chronic respiratory failure with hypoxia: Secondary | ICD-10-CM | POA: Diagnosis not present

## 2022-03-29 NOTE — Telephone Encounter (Signed)
Next OV 04/15/22

## 2022-04-02 ENCOUNTER — Other Ambulatory Visit: Payer: Self-pay

## 2022-04-02 DIAGNOSIS — J438 Other emphysema: Secondary | ICD-10-CM

## 2022-04-03 ENCOUNTER — Ambulatory Visit (INDEPENDENT_AMBULATORY_CARE_PROVIDER_SITE_OTHER): Payer: Medicare Other

## 2022-04-03 VITALS — Ht 63.0 in | Wt 235.0 lb

## 2022-04-03 DIAGNOSIS — J9612 Chronic respiratory failure with hypercapnia: Secondary | ICD-10-CM | POA: Diagnosis not present

## 2022-04-03 DIAGNOSIS — J449 Chronic obstructive pulmonary disease, unspecified: Secondary | ICD-10-CM | POA: Diagnosis not present

## 2022-04-03 DIAGNOSIS — Z0000 Encounter for general adult medical examination without abnormal findings: Secondary | ICD-10-CM

## 2022-04-03 NOTE — Progress Notes (Signed)
Subjective:   Rebecca Mcintyre is a 76 y.o. female who presents for Medicare Annual (Subsequent) preventive examination.  Review of Systems    Virtual Visit via Telephone Note  I connected with  Rebecca Mcintyre on 04/03/22 at  8:15 AM EST by telephone and verified that I am speaking with the correct person using two identifiers.  Location: Patient: Home Provider: Office Persons participating in the virtual visit: patient/Nurse Health Advisor   I discussed the limitations, risks, security and privacy concerns of performing an evaluation and management service by telephone and the availability of in person appointments. The patient expressed understanding and agreed to proceed.  Interactive audio and video telecommunications were attempted between this nurse and patient, however failed, due to patient having technical difficulties OR patient did not have access to video capability.  We continued and completed visit with audio only.  Some vital signs may be absent or patient reported.   Criselda Peaches, LPN  Cardiac Risk Factors include: advanced age (>71mn, >>42women);diabetes mellitus;hypertension     Objective:    Today's Vitals   04/03/22 0822  Weight: 235 lb (106.6 kg)  Height: 5' 3" (1.6 m)   Body mass index is 41.63 kg/m.     04/03/2022    8:31 AM 04/06/2021   10:22 AM 03/30/2021    3:42 PM 11/29/2020    6:10 AM 03/22/2020   10:33 AM 12/13/2018    8:19 PM 11/04/2018   10:24 AM  Advanced Directives  Does Patient Have a Medical Advance Directive? Yes Yes Yes No Yes Yes No  Type of AParamedicof [place]ac[place]Living will HDaviessLiving will [place]Living will  [place]Living will [place]Living will   Does patient want to make changes to medical advance directive?  No - Patient declined    No - Patient declined   Copy of H[place]eficin Chart? No - copy  requested No - copy requested No - copy requested  No - copy requested No - copy requested   Would patient like information on creating a medical advance directive?    No - Patient declined   No - Patient declined    Current Medications (verified) Outpatient Encounter Medications as of 04/03/2022  Medication Sig   ACCU-CHEK GUIDE test strip USE AS DIRECTED TWICE A DAY   Accu-Chek Softclix Lancets lancets Use as instructed twice a day Diagnosis E11.9   acetaminophen (TYLENOL) 500 MG tablet Take 1,000 mg by mouth every 6 (six) hours as needed for mild pain.   albuterol (PROVENTIL) (2.5 MG/3ML) 0.083% nebulizer solution Take 3 mLs (2.5 mg total) by nebulization every 6 (six) hours as needed for wheezing or shortness of breath.   albuterol (VENTOLIN HFA) 108 (90 Base) MCG/ACT inhaler USE 2 PUFFS EVERY 6 HOURS AS NEEDED FOR WHEEZE OR SHORTNESS OF BREATH   allopurinol (ZYLOPRIM) 100 MG tablet TAKE 1 TABLET BY MOUTH TWICE A DAY   aspirin 81 MG tablet Take 81 mg by mouth daily.   Blood Glucose Monitoring Suppl (ACCU-CHEK GUIDE) w/Device KIT Use as directed twice a day Diagnosis E11.9   Budeson-Glycopyrrol-Formoterol (BREZTRI AEROSPHERE) 160-9-4.8 MCG/ACT AERO Inhale 2 puffs into the lungs in the morning and at bedtime.   Budeson-Glycopyrrol-Formoterol (BREZTRI AEROSPHERE) 160-9-4.8 MCG/ACT AERO Inhale 2 puffs into the lungs in the morning and at bedtime.   dapagliflozin propanediol (FARXIGA) 5 MG TABS tablet Take 1 tablet (5 mg total)  by mouth daily before breakfast.   furosemide (LASIX) 40 MG tablet TAKE 1 TABLET BY MOUTH EVERY DAY   ketoconazole (NIZORAL) 2 % cream Apply 1 application topically 2 (two) times daily.   KLOR-CON M10 10 MEQ tablet TAKE 1 TABLET BY MOUTH TWICE A DAY   metFORMIN (GLUCOPHAGE) 500 MG tablet TAKE 1 TABLET BY MOUTH TWICE A DAY WITH A MEAL INCREASE TO 2 TABLETS TWICE A DAY WITH A MEAL   metoprolol tartrate (LOPRESSOR) 25 MG tablet TAKE 1/2 TABLETS BY MOUTH 2 TIMES DAILY    nitroGLYCERIN (NITROSTAT) 0.4 MG SL tablet PLACE 1 TABLET UNDER THE TONGUE EVERY 5 MINUTES AS NEEDED.   rOPINIRole (REQUIP) 0.5 MG tablet TAKE 1 TABLET AT BEDTIME FOR RESTLESS LEG   rosuvastatin (CRESTOR) 20 MG tablet Take 1 tablet (20 mg total) by mouth daily.   Spacer/Aero-Hold Chamber Bags MISC Please show patient how to use   No facility-administered encounter medications on file as of 04/03/2022.    Allergies (verified) Atorvastatin   History: Past Medical History:  Diagnosis Date   Acute on chronic respiratory failure with hypoxia and hypercapnia (HCC) 11/05/2018   Allergy    ASCUS on Pap smear    had repeat x 2 normal with [name] Plan 2011   Coronary artery disease    Gout    Hyperlipidemia    Hypertension    Myocardial infarction [facility])    2002 95% proximal LAD stenosis with thrombus followed by 90% stenosis, the first diagonal 80% stenosis, circumflex 30% stenosis, circumflex obtuse marginal subbranch 70% stenosis, PDA 40% stenosis. She had a drug-eluting stent placement with a Pixel stent   Osteopenia    Solitary kidney    Past Surgical History:  Procedure Laterality Date   CARDIAC CATHETERIZATION     CORONARY ANGIOPLASTY     CORONARY ANGIOPLASTY WITH STENT PLACEMENT  2002   HEMORRHOID SURGERY  2011   LAD Stent angioplasty  2002   Right oophorectomy with salpingectomy     benign growth Dr. Joneen Caraway   Family History  Problem Relation Age of Onset   Breast cancer Mother 68   Lung cancer Father    Heart attack Brother        Died age 57 MI   Colon cancer Brother    Heart disease Daughter 9       smoker and overweight    Esophageal cancer Neg Hx    Pancreatic cancer Neg Hx    Rectal cancer Neg Hx    Stomach cancer Neg Hx    Social History   Socioeconomic History   Marital status: Single    Spouse name: Not on file   Number of children: 2   Years of education: Not on file   Highest education level: Not on file  Occupational History   Occupation: semi  retired    Fish farm manager: Clinical research associate  Tobacco Use   Smoking status: Former    Packs/day: 1.00    Years: 39.00    Total pack years: 39.00    Types: Cigarettes    Quit date: 11/04/2018    Years since quitting: 3.4   Smokeless tobacco: Never  Vaping Use   Vaping Use: Never used  Substance and Sexual Activity   Alcohol use: Yes    Comment: 1-2 per month   Drug use: No   Sexual activity: Not on file  Other Topics Concern   Not on file  Social History Narrative   Lives alone   Left Handed  Drinks 1-2 cups caffeine daily   Social Determinants of Health   Financial Resource Strain: Low Risk  (04/03/2022)   Overall Financial Resource Strain (CARDIA)    Difficulty of Paying Living Expenses: Not hard at all  Food Insecurity: No Food Insecurity (04/03/2022)   Hunger Vital Sign    Worried About Running Out of Food in the Last Year: Never true    Ran Out of Food in the Last Year: Never true  Transportation Needs: No Transportation Needs (04/03/2022)   PRAPARE - Hydrologist (Medical): No    Lack of Transportation (Non-Medical): No  Physical Activity: Insufficiently Active (04/03/2022)   Exercise Vital Sign    Days of Exercise per Week: 5 days    Minutes of Exercise per Session: 20 min  Stress: No Stress Concern Present (04/03/2022)   [place]    Feeling of Stress : Not at all  Social Connections: Socially Isolated (04/03/2022)   Social Connection and Isolation Panel [NHANES]    Frequency of Communication with Friends and Family: More than three times a week    Frequency of Social Gatherings with Friends and Family: More than three times a week    Attends Religious Services: Never    Marine scientist or Organizations: No    Attends Music therapist: Never    Marital Status: Divorced    Tobacco Counseling Counseling given: Not Answered   Clinical Intake: Nutrition Risk  Assessment:  Has the patient had any N/V/D within the last 2 months?  No  Does the patient have any non-healing wounds?  No  Has the patient had any unintentional weight loss or weight gain?  No   Diabetes:  Is the patient diabetic?  Yes  If diabetic, was a CBG obtained today?  Yes  CBG 112 Taken by patient Did the patient bring in their glucometer from home?  No  How often do you monitor your CBG's? 2 X Daily.   Financial Strains and Diabetes Management:  Are you having any financial strains with the device, your supplies or your medication? No .  Does the patient want to be seen by Chronic Care Management for management of their diabetes?  No  Would the patient like to be referred to a Nutritionist or for Diabetic Management?  No   Diabetic Exams:  Diabetic Eye Exam: Completed No. Overdue for diabetic eye exam. Pt has been advised about the importance in completing this exam. A referral has been placed today. Message sent to referral coordinator for scheduling purposes. Advised pt to expect a call from office referred to regarding appt.  Diabetic Foot Exam: Completed No. Pt has been advised about the importance in completing this exam. Pt is scheduled for diabetic foot exam on Followed by PCP.   Pre-visit preparation completed: Yes  Pain : No/denies pain     BMI - recorded: 41.63 Nutritional Status: BMI > 30  Obese Nutritional Risks: None  How often do you need to have someone help you when you read instructions, pamphlets, or other written materials from your doctor or pharmacy?: 1 - Never  Diabetic?  Yes  Interpreter Needed?: No  Information entered by :: Rolene Arbour LPN   Activities of Daily Living    04/03/2022    8:29 AM 04/02/2022   11:02 AM  In your present state of health, do you have any difficulty performing the following activities:  Hearing?  1 1  Comment Wears hearing aids   Vision? 0 0  Difficulty concentrating or making decisions? 0 0  Walking or  climbing stairs? 0 0  Dressing or bathing? 0 0  Doing errands, shopping? 0 0  Preparing Food and eating ? N N  Using the Toilet? N N  In the past six months, have you accidently leaked urine? N N  Do you have problems with loss of bowel control? N N  Managing your Medications? N N  Managing your Finances? N N  Housekeeping or managing your Housekeeping? N N    Patient Care Team: Panosh, Standley Brooking, MD as PCP - General Minus Breeding, MD as PCP - Cardiology (Cardiology) Paula Compton, MD as Attending Physician (Obstetrics and Gynecology) Irene Shipper, MD as Attending Physician (Gastroenterology) Sharyne Peach, MD as Consulting Physician (Ophthalmology) Minus Breeding, MD as Consulting Physician (Cardiology) Rigoberto Noel, MD as Consulting Physician (Pulmonary Disease) Izora Gala, MD as Consulting Physician (Otolaryngology) Viona Gilmore, [facility] as Pharmacist (Pharmacist)  Indicate any recent Medical Services you may have received from other than [facility] providers in the past year (date may be approximate).     Assessment:   This is a routine wellness examination for Rebecca Mcintyre.  Hearing/Vision screen Hearing Screening - Comments:: Wears hearing aids Vision Screening - Comments::  up to date with routine eye exams with Dr Delman Cheadle    Dietary issues and exercise activities discussed: Exercise limited by: None identified   Goals Addressed               This Visit's Progress     Patient Stated (pt-stated)        Increase exercise and lose weight .       Depression Screen    04/03/2022    8:28 AM 12/24/2021    2:40 PM 05/22/2021    2:38 PM 04/06/2021   10:21 AM 03/30/2021    3:39 PM 03/22/2020   10:30 AM 01/28/2019    3:02 PM  PHQ 2/9 Scores  PHQ - 2 Score 0 0 0 0 0 0 0  PHQ- 9 Score   0    0    Fall Risk    04/03/2022    8:30 AM 04/02/2022   11:02 AM 05/22/2021    2:38 PM 04/06/2021   10:20 AM 03/30/2021    3:43 PM  Fall Risk   Falls in the past year? 1 0 0 0 0   Number falls in past yr: 0 0  0 0  Injury with Fall? 0 0  0 0  Risk for fall due to : No Fall Risks   Impaired mobility   Follow up Falls prevention discussed   Education provided;Falls prevention discussed Falls evaluation completed    FALL RISK PREVENTION PERTAINING TO THE HOME:  Any stairs in or around the home? Yes  If so, are there any without handrails? No  Home free of loose throw rugs in walkways, pet beds, electrical cords, etc? Yes  Adequate lighting in your home to reduce risk of falls? Yes   ASSISTIVE DEVICES UTILIZED TO PREVENT FALLS:  Life alert? No  Use of a cane, walker or w/c? No  Grab bars in the bathroom? Yes  Shower chair or bench in shower? Yes  Elevated toilet seat or a handicapped toilet? Yes   TIMED UP AND GO:  Was the test performed? No . Audio Visit  Cognitive Function:        04/03/2022  8:31 AM 03/22/2020   10:38 AM  6CIT Screen  What Year? 0 points 0 points  What month? 0 points 0 points  What time? 0 points   Count back from 20 0 points 0 points  Months in reverse 0 points 0 points  Repeat phrase 0 points 0 points  Total Score 0 points     Immunizations Immunization History  Administered Date(s) Administered   Fluad Quad(high Dose 65+) 02/11/2019, 02/29/2020, 04/10/2021, 03/21/2022   Influenza Split 04/29/2011, 04/08/2012   Influenza Whole 02/16/2008, 03/15/2009, 03/20/2010   Influenza, High Dose Seasonal PF 04/14/2013, 02/14/2015, 02/28/2016, 03/10/2017, 03/25/2018   Influenza,inj,Quad PF,6+ Mos 04/18/2014   Influenza,inj,quad, With Preservative 02/25/2019   Influenza-Unspecified 02/24/2017   PFIZER(Purple Top)SARS-COV-2 Vaccination 06/15/2019, 07/06/2019, 04/13/2020   Pneumococcal Conjugate-13 04/08/2012   Pneumococcal Polysaccharide-23 04/14/2013   Td 09/14/2007   Unspecified SARS-COV-2 Vaccination 04/25/2021   Zoster Recombinat (Shingrix) 06/30/2021, 10/12/2021   Zoster, Live 09/14/2007    TDAP status: Due, Education  has been provided regarding the importance of this vaccine. Advised may receive this vaccine at local pharmacy or Health Dept. Aware to provide a copy of the vaccination record if obtained from local pharmacy or Health Dept. Verbalized acceptance and understanding.  Flu Vaccine status: Up to date  Pneumococcal vaccine status: Up to date  Covid-19 vaccine status: Completed vaccines  Qualifies for Shingles Vaccine? Yes   Zostavax completed Yes   Shingrix Completed?: Yes  Screening Tests Health Maintenance  Topic Date Due   Lung Cancer Screening  11/04/2019   FOOT EXAM  04/03/2021   HEMOGLOBIN A1C  10/08/2021   Diabetic kidney evaluation - Urine ACR  04/04/2022 (Originally 03/27/2021)   COVID-19 Vaccine (5 - Pfizer risk series) 04/19/2022 (Originally 06/20/2021)   TETANUS/TDAP  04/04/2023 (Originally 09/13/2017)   OPHTHALMOLOGY EXAM  05/10/2022   Diabetic kidney evaluation - GFR measurement  01/15/2023   Medicare Annual Wellness (AWV)  04/04/2023   Pneumonia Vaccine 4+ Years old  Completed   INFLUENZA VACCINE  Completed   DEXA SCAN  Completed   Hepatitis C Screening  Completed   Zoster Vaccines- Shingrix  Completed   HPV VACCINES  Aged Out   MAMMOGRAM  Discontinued   COLONOSCOPY (Pts 45-40yr Insurance coverage will need to be confirmed)  Discontinued    Health Maintenance  Health Maintenance Due  Topic Date Due   Lung Cancer Screening  11/04/2019   FOOT EXAM  04/03/2021   HEMOGLOBIN A1C  10/08/2021    Colorectal cancer screening: No longer required.   Mammogram status: No longer required due to Age.  Bone Density status: Ordered 03/27/22. Pt provided with contact info and advised to call to schedule appt.  Lung Cancer Screening: (Low Dose CT Chest recommended if Age 552-80years, 30 pack-year currently smoking OR have quit w/in 15years. Does qualify. Patient Deferred    Additional Screening:  Hepatitis C Screening: does qualify; Completed 03/10/17  Vision Screening:  Recommended annual ophthalmology exams for early detection of glaucoma and other disorders of the eye. Is the patient up to date with their annual eye exam?  Yes  Who is the provider or what is the name of the office in which the patient attends annual eye exams? Dr GDelman CheadleIf pt is not established with a provider, would they like to be referred to a provider to establish care? No .   Dental Screening: Recommended annual dental exams for proper oral hygiene  Community Resource Referral / Chronic Care Management:  CRR required this visit?  No   CCM required this visit?  No      Plan:     I have personally reviewed and noted the following in the patient's chart:   Medical and social history Use of alcohol, tobacco or illicit drugs  Current medications and supplements including opioid prescriptions. Patient is not currently taking opioid prescriptions. Functional ability and status Nutritional status Physical activity Advanced directives List of other physicians Hospitalizations, surgeries, and ER visits in previous 12 months Vitals Screenings to include cognitive, depression, and falls Referrals and appointments  In addition, I have reviewed and discussed with patient certain preventive protocols, quality metrics, and best practice recommendations. A written personalized care plan for preventive services as well as general preventive health recommendations were provided to patient.     Criselda Peaches, LPN   [id number]   Nurse Notes: Patient due Diabetic kidney evaluation-Urine ACR and Hemoglobin A1C

## 2022-04-03 NOTE — Patient Instructions (Addendum)
Rebecca Mcintyre , Thank you for taking time to come for your Medicare Wellness Visit. I appreciate your ongoing commitment to your health goals. Please review the following plan we discussed and let me know if I can assist you in the future.   These are the goals we discussed:  Goals       No nothing to address at this time (pt-stated)      Care Coordination Interventions: Reviewed medications with patient and discussed purpose of all medications Reviewed scheduled/upcoming provider appointments including Screening for signs and symptoms of depression related to chronic disease state  Assessed social determinant of health barriers All appointments discussed along with providers and dates  Pt reports all medical conditions are currently controlled and managed well with no acute needs and on home O2 continuously for her COPD. Pt currently involved with provider's pharmacy for any medication needs.  No GAPS in CARE noted with review and pt has access to her [facility]        Patient Stated (pt-stated)      Increase exercise and lose weight .        This is a list of the screening recommended for you and due dates:  Health Maintenance  Topic Date Due   Screening for Lung Cancer  11/04/2019   Complete foot exam   04/03/2021   Hemoglobin A1C  10/08/2021   Yearly kidney health urinalysis for diabetes  04/04/2022*   COVID-19 Vaccine (5 - Pfizer risk series) 04/19/2022*   Tetanus Vaccine  04/04/2023*   Eye exam for diabetics  05/10/2022   Yearly kidney function blood test for diabetes  01/15/2023   Medicare Annual Wellness Visit  04/04/2023   Pneumonia Vaccine  Completed   Flu Shot  Completed   DEXA scan (bone density measurement)  Completed   Hepatitis C Screening: USPSTF Recommendation to screen - Ages 35-79 yo.  Completed   Zoster (Shingles) Vaccine  Completed   HPV Vaccine  Aged Out   Mammogram  Discontinued   Colon Cancer Screening  Discontinued  *Topic was postponed. The date shown  is not the original due date.    Advanced directives: Please bring a copy of your health care power of attorney and living will to the office to be added to your chart at your convenience.   Conditions/risks identified: None  Next appointment: Follow up in one year for your annual wellness visit     Preventive Care 65 Years and Older, Female Preventive care refers to lifestyle choices and visits with your health care provider that can promote health and wellness. What does preventive care include? A yearly physical exam. This is also called an annual well check. Dental exams once or twice a year. Routine eye exams. Ask your health care provider how often you should have your eyes checked. Personal lifestyle choices, including: Daily care of your teeth and gums. Regular physical activity. Eating a healthy diet. Avoiding tobacco and drug use. Limiting alcohol use. Practicing safe sex. Taking low-dose aspirin every day. Taking vitamin and mineral supplements as recommended by your health care provider. What happens during an annual well check? The services and screenings done by your health care provider during your annual well check will depend on your age, overall health, lifestyle risk factors, and family history of disease. Counseling  Your health care provider may ask you questions about your: Alcohol use. Tobacco use. Drug use. Emotional well-being. Home and relationship well-being. Sexual activity. Eating habits. History of falls. Memory and  ability to understand (cognition). Work and work Statistician. Reproductive health. Screening  You may have the following tests or measurements: Height, weight, and BMI. Blood pressure. Lipid and cholesterol levels. These may be checked every 5 years, or more frequently if you are over 34 years old. Skin check. Lung cancer screening. You may have this screening every year starting at age 87 if you have a 30-pack-year history of  smoking and currently smoke or have quit within the past 15 years. Fecal occult blood test (FOBT) of the stool. You may have this test every year starting at age 35. Flexible sigmoidoscopy or colonoscopy. You may have a sigmoidoscopy every 5 years or a colonoscopy every 10 years starting at age 86. Hepatitis C blood test. Hepatitis B blood test. Sexually transmitted disease (STD) testing. Diabetes screening. This is done by checking your blood sugar (glucose) after you have not eaten for a while (fasting). You may have this done every 1-3 years. Bone density scan. This is done to screen for osteoporosis. You may have this done starting at age 59. Mammogram. This may be done every 1-2 years. Talk to your health care provider about how often you should have regular mammograms. Talk with your health care provider about your test results, treatment options, and if necessary, the need for more tests. Vaccines  Your health care provider may recommend certain vaccines, such as: Influenza vaccine. This is recommended every year. Tetanus, diphtheria, and acellular pertussis (Tdap, Td) vaccine. You may need a Td booster every 10 years. Zoster vaccine. You may need this after age 61. Pneumococcal 13-valent conjugate (PCV13) vaccine. One dose is recommended after age 58. Pneumococcal polysaccharide (PPSV23) vaccine. One dose is recommended after age 66. Talk to your health care provider about which screenings and vaccines you need and how often you need them. This information is not intended to replace advice given to you by your health care provider. Make sure you discuss any questions you have with your health care provider. Document Released: 06/09/2015 Document Revised: 01/31/2016 Document Reviewed: 03/14/2015 Elsevier Interactive Patient Education  2017 [place] Prevention in the Home Falls can cause injuries. They can happen to people of all ages. There are many things you can do to make  your home safe and to help prevent falls. What can I do on the outside of my home? Regularly fix the edges of walkways and driveways and fix any cracks. Remove anything that might make you trip as you walk through a door, such as a raised step or threshold. Trim any bushes or trees on the path to your home. Use bright outdoor lighting. Clear any walking paths of anything that might make someone trip, such as rocks or tools. Regularly check to see if handrails are loose or broken. Make sure that both sides of any steps have handrails. Any raised decks and porches should have guardrails on the edges. Have any leaves, snow, or ice cleared regularly. Use sand or salt on walking paths during winter. Clean up any spills in your garage right away. This includes oil or grease spills. What can I do in the bathroom? Use night lights. Install grab bars by the toilet and in the tub and shower. Do not use towel bars as grab bars. Use non-skid mats or decals in the tub or shower. If you need to sit down in the shower, use a plastic, non-slip stool. Keep the floor dry. Clean up any water that spills on the floor as soon  as it happens. Remove soap buildup in the tub or shower regularly. Attach bath mats securely with double-sided non-slip rug tape. Do not have throw rugs and other things on the floor that can make you trip. What can I do in the bedroom? Use night lights. Make sure that you have a light by your bed that is easy to reach. Do not use any sheets or blankets that are too big for your bed. They should not hang down onto the floor. Have a firm chair that has side arms. You can use this for support while you get dressed. Do not have throw rugs and other things on the floor that can make you trip. What can I do in the kitchen? Clean up any spills right away. Avoid walking on wet floors. Keep items that you use a lot in easy-to-reach places. If you need to reach something above you, use a  strong step stool that has a grab bar. Keep electrical cords out of the way. Do not use floor polish or wax that makes floors slippery. If you must use wax, use non-skid floor wax. Do not have throw rugs and other things on the floor that can make you trip. What can I do with my stairs? Do not leave any items on the stairs. Make sure that there are handrails on both sides of the stairs and use them. Fix handrails that are broken or loose. Make sure that handrails are as long as the stairways. Check any carpeting to make sure that it is firmly attached to the stairs. Fix any carpet that is loose or worn. Avoid having throw rugs at the top or bottom of the stairs. If you do have throw rugs, attach them to the floor with carpet tape. Make sure that you have a light switch at the top of the stairs and the bottom of the stairs. If you do not have them, ask someone to add them for you. What else can I do to help prevent falls? Wear shoes that: Do not have high heels. Have rubber bottoms. Are comfortable and fit you well. Are closed at the toe. Do not wear sandals. If you use a stepladder: Make sure that it is fully opened. Do not climb a closed stepladder. Make sure that both sides of the stepladder are locked into place. Ask someone to hold it for you, if possible. Clearly mark and make sure that you can see: Any grab bars or handrails. First and last steps. Where the edge of each step is. Use tools that help you move around (mobility aids) if they are needed. These include: Canes. Walkers. Scooters. Crutches. Turn on the lights when you go into a dark area. Replace any light bulbs as soon as they burn out. Set up your furniture so you have a clear path. Avoid moving your furniture around. If any of your floors are uneven, fix them. If there are any pets around you, be aware of where they are. Review your medicines with your doctor. Some medicines can make you feel dizzy. This can  increase your chance of falling. Ask your doctor what other things that you can do to help prevent falls. This information is not intended to replace advice given to you by your health care provider. Make sure you discuss any questions you have with your health care provider. Document Released: 03/09/2009 Document Revised: 10/19/2015 Document Reviewed: 06/17/2014 Elsevier Interactive Patient Education  2017 [employer].

## 2022-04-08 ENCOUNTER — Encounter: Payer: Self-pay | Admitting: Internal Medicine

## 2022-04-08 NOTE — Progress Notes (Signed)
[place]

## 2022-04-14 NOTE — Progress Notes (Unsigned)
No chief complaint on file.   HPI: Patient  Rebecca Mcintyre  76 y.o. comes in today for Preventive Health Care visit   PULM: copd  CAD DM  metfomrin   HLD crestor  Health Maintenance  Topic Date Due   Lung Cancer Screening  11/04/2019   Diabetic kidney evaluation - Urine ACR  03/27/2021   FOOT EXAM  04/03/2021   HEMOGLOBIN A1C  10/08/2021   COVID-19 Vaccine (5 - Pfizer risk series) 04/19/2022 (Originally 06/20/2021)   OPHTHALMOLOGY EXAM  05/10/2022   Diabetic kidney evaluation - GFR measurement  01/15/2023   Medicare Annual Wellness (AWV)  04/04/2023   Pneumonia Vaccine 27+ Years old  Completed   INFLUENZA VACCINE  Completed   DEXA SCAN  Completed   Hepatitis C Screening  Completed   Zoster Vaccines- Shingrix  Completed   HPV VACCINES  Aged Out   MAMMOGRAM  Discontinued   COLONOSCOPY (Pts 45-77yr Insurance coverage will need to be confirmed)  Discontinued   Health Maintenance Review LIFESTYLE:  Exercise:   Tobacco/ETS: Alcohol:  Sugar beverages: Sleep: Drug use: no HH of  Work:    ROS:  GEN/ HEENT: No fever, significant weight changes sweats headaches vision problems hearing changes, CV/ PULM; No chest pain shortness of breath cough, syncope,edema  change in exercise tolerance. GI /GU: No adominal pain, vomiting, change in bowel habits. No blood in the stool. No significant GU symptoms. SKIN/HEME: ,no acute skin rashes suspicious lesions or bleeding. No lymphadenopathy, nodules, masses.  NEURO/ PSYCH:  No neurologic signs such as weakness numbness. No depression anxiety. IMM/ Allergy: No unusual infections.  Allergy .   REST of 12 system review negative except as per HPI   Past Medical History:  Diagnosis Date   Acute on chronic respiratory failure with hypoxia and hypercapnia (HCC) 11/05/2018   Allergy    ASCUS on Pap smear    had repeat x 2 normal with Dr. RMarvel Plan2011   Coronary artery disease    Gout    Hyperlipidemia    Hypertension    Myocardial  infarction ([facility]    2002 95% proximal LAD stenosis with thrombus followed by 90% stenosis, the first diagonal 80% stenosis, circumflex 30% stenosis, circumflex obtuse marginal subbranch 70% stenosis, PDA 40% stenosis. She had a drug-eluting stent placement with a Pixel stent   Osteopenia    Solitary kidney     Past Surgical History:  Procedure Laterality Date   CARDIAC CATHETERIZATION     CORONARY ANGIOPLASTY     CORONARY ANGIOPLASTY WITH STENT PLACEMENT  2002   HEMORRHOID SURGERY  2011   LAD Stent angioplasty  2002   Right oophorectomy with salpingectomy     benign growth Dr. RJoneen Caraway   Family History  Problem Relation Age of Onset   Breast cancer Mother 470  Lung cancer Father    Heart attack Brother        Died age [id number]   Colon cancer Brother    Heart disease Daughter 377      smoker and overweight    Esophageal cancer Neg Hx    Pancreatic cancer Neg Hx    Rectal cancer Neg Hx    Stomach cancer Neg Hx     Social History   Socioeconomic History   Marital status: Single    Spouse name: Not on file   Number of children: 2   Years of education: Not on file   Highest education level: Not on  file  Occupational History   Occupation: semi retired    Fish farm manager: Clinical research associate  Tobacco Use   Smoking status: Former    Packs/day: 1.00    Years: 39.00    Total pack years: 39.00    Types: Cigarettes    Quit date: 11/04/2018    Years since quitting: 3.4   Smokeless tobacco: Never  Vaping Use   Vaping Use: Never used  Substance and Sexual Activity   Alcohol use: Yes    Comment: 1-2 per month   Drug use: No   Sexual activity: Not on file  Other Topics Concern   Not on file  Social History Narrative   Lives alone   Left Handed   Drinks 1-2 cups caffeine daily   Social Determinants of Health   Financial Resource Strain: Low Risk  (04/03/2022)   Overall Financial Resource Strain (CARDIA)    Difficulty of Paying Living Expenses: Not hard at all  Food Insecurity: No Food  Insecurity (04/03/2022)   Hunger Vital Sign    Worried About Running Out of Food in the Last Year: Never true    Ran Out of Food in the Last Year: Never true  Transportation Needs: No Transportation Needs (04/03/2022)   PRAPARE - Hydrologist (Medical): No    Lack of Transportation (Non-Medical): No  Physical Activity: Insufficiently Active (04/03/2022)   Exercise Vital Sign    Days of Exercise per Week: 5 days    Minutes of Exercise per Session: 20 min  Stress: No Stress Concern Present (04/03/2022)   [name]    Feeling of Stress : Not at all  Social Connections: Socially Isolated (04/03/2022)   Social Connection and Isolation Panel [NHANES]    Frequency of Communication with Friends and Family: More than three times a week    Frequency of Social Gatherings with Friends and Family: More than three times a week    Attends Religious Services: Never    Marine scientist or Organizations: No    Attends Music therapist: Never    Marital Status: Divorced    Outpatient Medications Prior to Visit  Medication Sig Dispense Refill   ACCU-CHEK GUIDE test strip USE AS DIRECTED TWICE A DAY 100 strip 3   Accu-Chek Softclix Lancets lancets Use as instructed twice a day Diagnosis E11.9 100 each 3   acetaminophen (TYLENOL) 500 MG tablet Take 1,000 mg by mouth every 6 (six) hours as needed for mild pain.     albuterol (PROVENTIL) (2.5 MG/3ML) 0.083% nebulizer solution Take 3 mLs (2.5 mg total) by nebulization every 6 (six) hours as needed for wheezing or shortness of breath. 300 mL 11   albuterol (VENTOLIN HFA) 108 (90 Base) MCG/ACT inhaler USE 2 PUFFS EVERY 6 HOURS AS NEEDED FOR WHEEZE OR SHORTNESS OF BREATH 8.5 each 5   allopurinol (ZYLOPRIM) 100 MG tablet TAKE 1 TABLET BY MOUTH TWICE A DAY 180 tablet 1   aspirin 81 MG tablet Take 81 mg by mouth daily.     Blood Glucose Monitoring Suppl  (ACCU-CHEK GUIDE) w/Device KIT Use as directed twice a day Diagnosis E11.9 1 kit 2   Budeson-Glycopyrrol-Formoterol (BREZTRI AEROSPHERE) 160-9-4.8 MCG/ACT AERO Inhale 2 puffs into the lungs in the morning and at bedtime. 10.7 g 5   Budeson-Glycopyrrol-Formoterol (BREZTRI AEROSPHERE) 160-9-4.8 MCG/ACT AERO Inhale 2 puffs into the lungs in the morning and at bedtime. 10.7 g 11   dapagliflozin propanediol (  FARXIGA) 5 MG TABS tablet Take 1 tablet (5 mg total) by mouth daily before breakfast. 90 tablet 3   furosemide (LASIX) 40 MG tablet TAKE 1 TABLET BY MOUTH EVERY DAY 90 tablet 0   ketoconazole (NIZORAL) 2 % cream Apply 1 application topically 2 (two) times daily. 30 g 0   KLOR-CON M10 10 MEQ tablet TAKE 1 TABLET BY MOUTH TWICE A DAY 180 tablet 1   metFORMIN (GLUCOPHAGE) 500 MG tablet TAKE 1 TABLET BY MOUTH TWICE A DAY WITH A MEAL INCREASE TO 2 TABLETS TWICE A DAY WITH A MEAL 360 tablet 0   metoprolol tartrate (LOPRESSOR) 25 MG tablet TAKE 1/2 TABLETS BY MOUTH 2 TIMES DAILY 90 tablet 0   nitroGLYCERIN (NITROSTAT) 0.4 MG SL tablet PLACE 1 TABLET UNDER THE TONGUE EVERY 5 MINUTES AS NEEDED. 25 tablet 1   rOPINIRole (REQUIP) 0.5 MG tablet TAKE 1 TABLET AT BEDTIME FOR RESTLESS LEG 90 tablet 0   rosuvastatin (CRESTOR) 20 MG tablet Take 1 tablet (20 mg total) by mouth daily. 90 tablet 3   Spacer/Aero-Hold Chamber Bags MISC Please show patient how to use 1 each 0   No facility-administered medications prior to visit.     EXAM:  There were no vitals taken for this visit.  There is no height or weight on file to calculate BMI. Wt Readings from Last 3 Encounters:  04/03/22 235 lb (106.6 kg)  03/21/22 235 lb 9.6 oz (106.9 kg)  01/31/22 239 lb 9.6 oz (108.7 kg)    Physical Exam: Vital signs reviewed ZOX:WRUE is a well-developed well-nourished alert cooperative    who appearsr stated age in no acute distress.  HEENT: normocephalic atraumatic , Eyes: PERRL EOM's full, conjunctiva clear, Nares: paten,t  no deformity discharge or tenderness., Ears: no deformity EAC's clear TMs with normal landmarks. Mouth: clear OP, no lesions, edema.  Moist mucous membranes. Dentition in adequate repair. NECK: supple without masses, thyromegaly or bruits. CHEST/PULM:  Clear to auscultation and percussion breath sounds equal no wheeze , rales or rhonchi. No chest wall deformities or tenderness. Breast: normal by inspection . No dimpling, discharge, masses, tenderness or discharge . CV: PMI is nondisplaced, S1 S2 no gallops, murmurs, rubs. Peripheral pulses are full without delay.No JVD .  ABDOMEN: Bowel sounds normal nontender  No guard or rebound, no hepato splenomegal no CVA tenderness.  No hernia. Extremtities:  No clubbing cyanosis or edema, no acute joint swelling or redness no focal atrophy NEURO:  Oriented x3, cranial nerves 3-12 appear to be intact, no obvious focal weakness,gait within normal limits no abnormal reflexes or asymmetrical SKIN: No acute rashes normal turgor, color, no bruising or petechiae. PSYCH: Oriented, good eye contact, no obvious depression anxiety, cognition and judgment appear normal. LN: no cervical axillaryadenopathy  Lab Results  Component Value Date   WBC 9.1 01/14/2022   HGB 13.6 01/14/2022   HCT 42.9 01/14/2022   PLT 241.0 01/14/2022   GLUCOSE 121 (H) 01/14/2022   CHOL 167 01/29/2022   TRIG 139 01/29/2022   HDL 43 01/29/2022   LDLDIRECT 116.0 02/05/2019   LDLCALC 99 01/29/2022   ALT 17 10/03/2021   AST 21 10/03/2021   NA 139 01/14/2022   K 3.9 01/14/2022   CL 93 (L) 01/14/2022   CREATININE 1.00 01/14/2022   BUN 33 (H) 01/14/2022   CO2 37 (H) 01/14/2022   TSH 1.630 01/31/2022   INR 1.0 11/28/2020   HGBA1C 7.5 (H) 04/10/2021   MICROALBUR 2.8 (H) 03/27/2020  BP Readings from Last 3 Encounters:  03/21/22 128/66  01/31/22 101/61  01/23/22 126/76    Lab results reviewed with patient   ASSESSMENT AND PLAN:  Discussed the following assessment and  plan:    ICD-10-CM   1. Visit for preventive health examination  Z00.00     2. Essential hypertension  I10     3. Chronic obstructive pulmonary disease, unspecified COPD type ([place])  J44.9     4. Hyperlipidemia, unspecified hyperlipidemia type  E78.5     5. Type 2 diabetes mellitus with hyperglycemia, without long-term current use of insulin (HCC)  E11.65     6. Medication management  Z79.899     7. Oxygen dependent  Z99.81     8. Chronic diastolic congestive heart failure (HCC)  I50.32     Overdue  A1c ? No follow-ups on file.  Patient Care Team: Zidane Renner, Standley Brooking, MD as PCP - General Minus Breeding, MD as PCP - Cardiology (Cardiology) Paula Compton, MD as Attending Physician (Obstetrics and Gynecology) Irene Shipper, MD as Attending Physician (Gastroenterology) Sharyne Peach, MD as Consulting Physician (Ophthalmology) Minus Breeding, MD as Consulting Physician (Cardiology) Rigoberto Noel, MD as Consulting Physician (Pulmonary Disease) Izora Gala, MD as Consulting Physician (Otolaryngology) Viona Gilmore, [facility] as Pharmacist (Pharmacist) There are no Patient Instructions on file for this visit.  Standley Brooking. Delona Clasby M.D.

## 2022-04-15 ENCOUNTER — Encounter: Payer: Self-pay | Admitting: Internal Medicine

## 2022-04-15 ENCOUNTER — Ambulatory Visit (INDEPENDENT_AMBULATORY_CARE_PROVIDER_SITE_OTHER): Payer: Medicare Other | Admitting: Internal Medicine

## 2022-04-15 VITALS — BP 124/70 | HR 83 | Temp 97.7°F | Ht 63.0 in | Wt 232.6 lb

## 2022-04-15 DIAGNOSIS — J449 Chronic obstructive pulmonary disease, unspecified: Secondary | ICD-10-CM | POA: Diagnosis not present

## 2022-04-15 DIAGNOSIS — Z0000 Encounter for general adult medical examination without abnormal findings: Secondary | ICD-10-CM

## 2022-04-15 DIAGNOSIS — E785 Hyperlipidemia, unspecified: Secondary | ICD-10-CM

## 2022-04-15 DIAGNOSIS — I10 Essential (primary) hypertension: Secondary | ICD-10-CM | POA: Diagnosis not present

## 2022-04-15 DIAGNOSIS — E1165 Type 2 diabetes mellitus with hyperglycemia: Secondary | ICD-10-CM

## 2022-04-15 DIAGNOSIS — Z79899 Other long term (current) drug therapy: Secondary | ICD-10-CM | POA: Diagnosis not present

## 2022-04-15 DIAGNOSIS — I5032 Chronic diastolic (congestive) heart failure: Secondary | ICD-10-CM | POA: Diagnosis not present

## 2022-04-15 DIAGNOSIS — Z9981 Dependence on supplemental oxygen: Secondary | ICD-10-CM | POA: Diagnosis not present

## 2022-04-15 LAB — POCT GLYCOSYLATED HEMOGLOBIN (HGB A1C): Hemoglobin A1C: 6.4 % — AB (ref 4.0–5.6)

## 2022-04-15 NOTE — Patient Instructions (Addendum)
  Ok to  delay dexa scan until next year . Tay on metformin  Wilder Glade is a good medicine for heart failure kidney and diabets  but if cannot afford  may be ok to be off but  reasess.   But hour a 1c is good   6.4 today .

## 2022-04-20 ENCOUNTER — Other Ambulatory Visit: Payer: Self-pay | Admitting: Internal Medicine

## 2022-04-22 ENCOUNTER — Telehealth: Payer: Self-pay | Admitting: Pharmacist

## 2022-04-22 NOTE — Chronic Care Management (AMB) (Signed)
Chronic Care Management Pharmacy Assistant   Name: Rebecca Mcintyre  MRN: [id number] DOB: [birth year]  Reason for Encounter: Follow up with patient assistance for Farxiga.  Spoke with Hailey at [facility], she states patient was approved on 11/29/2021 through 05/26/22.  She states the last shipment was done on 11/29/2021. She states she has manually pushed through the refill and it will take 10-14 days to process and be delivered to the patient.  Hailey states the patient is set up on auto fill and unfortunately this doesn't always work, she advised in the future if any of the patients are set up on auto refill and they are running low on medication the patient needs to call [employer] so they can manually send the refill request for shipment.  Left message for patient to be expecting a delivery of Farxiga in 10 - 14 days.   [place] Pharmacist Assistant [phone]

## 2022-04-28 DIAGNOSIS — J449 Chronic obstructive pulmonary disease, unspecified: Secondary | ICD-10-CM | POA: Diagnosis not present

## 2022-04-28 DIAGNOSIS — J9611 Chronic respiratory failure with hypoxia: Secondary | ICD-10-CM | POA: Diagnosis not present

## 2022-04-28 DIAGNOSIS — J9612 Chronic respiratory failure with hypercapnia: Secondary | ICD-10-CM | POA: Diagnosis not present

## 2022-04-30 ENCOUNTER — Telehealth: Payer: Self-pay | Admitting: Pulmonary Disease

## 2022-04-30 ENCOUNTER — Encounter (HOSPITAL_BASED_OUTPATIENT_CLINIC_OR_DEPARTMENT_OTHER): Payer: Self-pay | Admitting: Pulmonary Disease

## 2022-04-30 MED ORDER — PREDNISONE 10 MG PO TABS: 10.0000 mg | ORAL_TABLET | Freq: Every day | ORAL | 0 refills | Status: DC

## 2022-04-30 MED ORDER — AZITHROMYCIN 250 MG PO TABS: 250.0000 mg | ORAL_TABLET | ORAL | 0 refills | Status: DC

## 2022-04-30 MED ORDER — PREDNISONE 10 MG PO TABS: 20.0000 mg | ORAL_TABLET | Freq: Every day | ORAL | 0 refills | Status: DC

## 2022-04-30 NOTE — Telephone Encounter (Signed)
I called and spoke with the pt's daughter  She states pt has developed increased SOB, wheezing and cough with yellow sputum over the past 2 days  She denies fevers, aches  She states she is congested in her chest and head  She is taking mucinex  She is taking breztri  She states that Nicole Kindred, "Dr Bari Mantis partner" told her not use use nebulizer anymore I offered appt and she asked that we call something in instead  Please advise, thanks!  Allergies  Allergen Reactions   Atorvastatin     REACTION: myalgias  and liver

## 2022-04-30 NOTE — Telephone Encounter (Signed)
Rigoberto Noel, MD  to Me     04/30/22  4:33 PM  Zpak Prednisone 10 mg tabs  Take 2 tabs daily with food x 5ds, then 1 tab daily with food x 5ds then STOP  While it is okay not to use nebulizer when she is in a stable state , since she is having a flareup, it is okay to use albuterol nebulizer as needed every 6 hours   I called and spoke with the pt's daughter and notified of recs  Rxs were sent  Nothing further needed

## 2022-05-02 NOTE — Telephone Encounter (Signed)
Talked with daughter per DPR and she stated that her mother would like to start  CPAP therapy. Order placed and nothing further needed.

## 2022-05-06 DIAGNOSIS — R0683 Snoring: Secondary | ICD-10-CM | POA: Diagnosis not present

## 2022-05-06 DIAGNOSIS — G4730 Sleep apnea, unspecified: Secondary | ICD-10-CM | POA: Diagnosis not present

## 2022-05-10 ENCOUNTER — Other Ambulatory Visit: Payer: Self-pay | Admitting: Pulmonary Disease

## 2022-05-13 ENCOUNTER — Encounter (HOSPITAL_BASED_OUTPATIENT_CLINIC_OR_DEPARTMENT_OTHER): Payer: Self-pay | Admitting: Pulmonary Disease

## 2022-05-14 ENCOUNTER — Ambulatory Visit (INDEPENDENT_AMBULATORY_CARE_PROVIDER_SITE_OTHER): Payer: Medicare Other | Admitting: Pulmonary Disease

## 2022-05-14 ENCOUNTER — Encounter (HOSPITAL_BASED_OUTPATIENT_CLINIC_OR_DEPARTMENT_OTHER): Payer: Self-pay | Admitting: Pulmonary Disease

## 2022-05-14 VITALS — BP 110/60 | HR 78 | Ht 63.0 in | Wt 235.5 lb

## 2022-05-14 DIAGNOSIS — J441 Chronic obstructive pulmonary disease with (acute) exacerbation: Secondary | ICD-10-CM

## 2022-05-14 MED ORDER — PREDNISONE 10 MG PO TABS: ORAL_TABLET | ORAL | 0 refills | Status: AC

## 2022-05-14 MED ORDER — AMOXICILLIN-POT CLAVULANATE 875-125 MG PO TABS: 1.0000 | ORAL_TABLET | Freq: Two times a day (BID) | ORAL | 0 refills | Status: DC

## 2022-05-14 NOTE — Patient Instructions (Signed)
  COPD exacerbation --START prednisone taper as directed --START augmentin as directed --Goal SpO2 >88%. Wear continuous until improved --OK to use a nebulizer while ill --If symptoms worsening on treatment then would recommend evaluation in the ED  Keep follow-up with Dr. [name]

## 2022-05-14 NOTE — Progress Notes (Unsigned)
   Subjective:    Patient ID: Rebecca Mcintyre, female    DOB: 1945-11-25, 76 y.o.   MRN: [id number]  HPI  76 yo ex-smoker for acute visit. Wheelchair bound. Followed by Dr. [name] for COPD, chronic hypoxemic respiratory failure and pulmonary hypertension. Identical twin of Rebecca Mcintyre ,my patient who passed away   PMH - CAD, diastolic HF,  dyslipidemia, obesity  - hospitalized 10/2018 for acute on chronic hypercarbic respiratory failure -slow to resolve COPD exacerbation 01/2020 - hospitalized 11/2020  for right lower extremity injury, course complicated by acute diastolic heart failure and encephalopathy due to narcotics elevated troponin   On OV 12/2021 >>refer her to interventional pulmonology for [place] valve evaluation , she has appointment at [facility] for January  Today she is having a flare up with worsening shortness of breath, wheezing, productive cough with yellow sputum, rhinorrhea, ear congestion, fatigue. Presents with her daughter. Has been using nebulizer and spirometer. Taking mucinex. Last exacerbation requiring prednisone two weeks ago which had partial improvement but now worsened. She is compliant with BiPAP and feels it has improved her energy and sleep quality.  Significant tests/ events reviewed  ABG 01/2022 7.4 7/57/65 on 3 L oxygen ABG 02/2020 7.4 2/44/53/87%   PFTs 12/29/2018 FVC 2.31 (82), FEV1 1.20 (56), ratio 52, no bronchodilatory response, DLCO 51 %   10/2018 CTA Chest- No PE; cardiomegaly, emphysema       NPSG 12/13/18  no obstructive sleep apnea, min O2 89%. Periodic limb movements did occur during sleep   Review of Systems  Constitutional:  Negative for chills, diaphoresis and fever.  HENT:  Positive for congestion.   Respiratory:  Positive for cough, shortness of breath and wheezing.   Cardiovascular:  Negative for chest pain, palpitations and leg swelling.       Objective:   Physical Exam  Physical Exam: General: Chronically-appearing, no acute  distress HENT: [place], AT Eyes: EOMI, no scleral icterus Respiratory: Diminished to auscultation bilaterally.  No crackles, wheezing or rales Cardiovascular: RRR, -M/R/G, no JVD Extremities:-Edema,-tenderness Neuro: AAO x4, CNII-XII grossly intact Psych: Normal mood, normal affect       Assessment & Plan:   COPD exacerbation --START prednisone taper as directed --START augmentin as directed --Goal SpO2 >88%. Wear continuous until improved --OK to use a nebulizer while ill --If symptoms worsening on treatment then would recommend evaluation in the ED  I have spent a total time of 30-minutes on the day of the appointment including chart review, data review, collecting history, coordinating care and discussing medical diagnosis and plan with the patient/family. Past medical history, allergies, medications were reviewed. Pertinent imaging, labs and tests included in this note have been reviewed and interpreted independently by me.  Rebecca Mcintyre, M.D. [facility] Pulmonary/Critical Care Medicine 05/15/2022 10:20 AM

## 2022-05-16 NOTE — Progress Notes (Signed)
Spoke with Tiffany at [place].  Patient's assistance for Minus Liberty and Judithann Sauger is on hold pending correction of household size.  [employer] have her household listed as 2 so there is a discrepancy with household size and income.  The office will need to send a letter of attestation stating to the best of the providers knowledge the patient is only a household of one, requesting reconsideration for patient assistance.  Each letter will need to include the patients ID with [employer] which is [id number].   The letter for Wilder Glade will need to include the name of the medication and the case # [id number]. The letter for Judithann Sauger will need to include the name of the medication and the case # [id number].  Each letter will need to be faxed to [employer] at [phone].

## 2022-05-21 ENCOUNTER — Other Ambulatory Visit: Payer: Self-pay | Admitting: Internal Medicine

## 2022-05-24 ENCOUNTER — Encounter (HOSPITAL_BASED_OUTPATIENT_CLINIC_OR_DEPARTMENT_OTHER): Payer: Self-pay | Admitting: Pulmonary Disease

## 2022-05-25 ENCOUNTER — Emergency Department (HOSPITAL_COMMUNITY): Payer: Medicare Other

## 2022-05-25 ENCOUNTER — Encounter (HOSPITAL_COMMUNITY): Payer: Self-pay | Admitting: Internal Medicine

## 2022-05-25 ENCOUNTER — Other Ambulatory Visit: Payer: Self-pay

## 2022-05-25 ENCOUNTER — Inpatient Hospital Stay (HOSPITAL_COMMUNITY)
Admission: EM | Admit: 2022-05-25 | Discharge: 2022-05-30 | DRG: 190 | Disposition: A | Discharge status: Home Health | Payer: Medicare Other | Attending: Internal Medicine | Admitting: Internal Medicine

## 2022-05-25 DIAGNOSIS — Z7982 Long term (current) use of aspirin: Secondary | ICD-10-CM

## 2022-05-25 DIAGNOSIS — I252 Old myocardial infarction: Secondary | ICD-10-CM

## 2022-05-25 DIAGNOSIS — I110 Hypertensive heart disease with heart failure: Secondary | ICD-10-CM | POA: Diagnosis present

## 2022-05-25 DIAGNOSIS — J441 Chronic obstructive pulmonary disease with (acute) exacerbation: Principal | ICD-10-CM | POA: Diagnosis present

## 2022-05-25 DIAGNOSIS — Z6836 Body mass index (BMI) 36.0-36.9, adult: Secondary | ICD-10-CM | POA: Diagnosis not present

## 2022-05-25 DIAGNOSIS — K219 Gastro-esophageal reflux disease without esophagitis: Secondary | ICD-10-CM | POA: Diagnosis present

## 2022-05-25 DIAGNOSIS — I10 Essential (primary) hypertension: Secondary | ICD-10-CM | POA: Diagnosis not present

## 2022-05-25 DIAGNOSIS — D72829 Elevated white blood cell count, unspecified: Secondary | ICD-10-CM | POA: Diagnosis not present

## 2022-05-25 DIAGNOSIS — J9621 Acute and chronic respiratory failure with hypoxia: Secondary | ICD-10-CM | POA: Diagnosis present

## 2022-05-25 DIAGNOSIS — Z6841 Body Mass Index (BMI) 40.0 and over, adult: Secondary | ICD-10-CM

## 2022-05-25 DIAGNOSIS — I2510 Atherosclerotic heart disease of native coronary artery without angina pectoris: Secondary | ICD-10-CM | POA: Diagnosis present

## 2022-05-25 DIAGNOSIS — Z87891 Personal history of nicotine dependence: Secondary | ICD-10-CM | POA: Diagnosis not present

## 2022-05-25 DIAGNOSIS — G2581 Restless legs syndrome: Secondary | ICD-10-CM | POA: Diagnosis present

## 2022-05-25 DIAGNOSIS — Z955 Presence of coronary angioplasty implant and graft: Secondary | ICD-10-CM

## 2022-05-25 DIAGNOSIS — E785 Hyperlipidemia, unspecified: Secondary | ICD-10-CM | POA: Diagnosis present

## 2022-05-25 DIAGNOSIS — R0602 Shortness of breath: Secondary | ICD-10-CM | POA: Diagnosis present

## 2022-05-25 DIAGNOSIS — Q600 Renal agenesis, unilateral: Secondary | ICD-10-CM

## 2022-05-25 DIAGNOSIS — E669 Obesity, unspecified: Secondary | ICD-10-CM | POA: Diagnosis present

## 2022-05-25 DIAGNOSIS — Z7951 Long term (current) use of inhaled steroids: Secondary | ICD-10-CM

## 2022-05-25 DIAGNOSIS — I5032 Chronic diastolic (congestive) heart failure: Secondary | ICD-10-CM | POA: Diagnosis present

## 2022-05-25 DIAGNOSIS — R6889 Other general symptoms and signs: Secondary | ICD-10-CM | POA: Diagnosis not present

## 2022-05-25 DIAGNOSIS — M8580 Other specified disorders of bone density and structure, unspecified site: Secondary | ICD-10-CM | POA: Diagnosis present

## 2022-05-25 DIAGNOSIS — J9612 Chronic respiratory failure with hypercapnia: Secondary | ICD-10-CM | POA: Diagnosis not present

## 2022-05-25 DIAGNOSIS — J439 Emphysema, unspecified: Secondary | ICD-10-CM | POA: Diagnosis not present

## 2022-05-25 DIAGNOSIS — E876 Hypokalemia: Secondary | ICD-10-CM | POA: Diagnosis present

## 2022-05-25 DIAGNOSIS — R000 Tachycardia, unspecified: Secondary | ICD-10-CM | POA: Diagnosis present

## 2022-05-25 DIAGNOSIS — Z79899 Other long term (current) drug therapy: Secondary | ICD-10-CM

## 2022-05-25 DIAGNOSIS — N179 Acute kidney failure, unspecified: Secondary | ICD-10-CM | POA: Diagnosis present

## 2022-05-25 DIAGNOSIS — J432 Centrilobular emphysema: Secondary | ICD-10-CM | POA: Diagnosis not present

## 2022-05-25 DIAGNOSIS — I509 Heart failure, unspecified: Secondary | ICD-10-CM

## 2022-05-25 DIAGNOSIS — Z8249 Family history of ischemic heart disease and other diseases of the circulatory system: Secondary | ICD-10-CM | POA: Diagnosis not present

## 2022-05-25 DIAGNOSIS — I700 Atherosclerosis of aorta: Secondary | ICD-10-CM | POA: Diagnosis not present

## 2022-05-25 DIAGNOSIS — J9622 Acute and chronic respiratory failure with hypercapnia: Secondary | ICD-10-CM | POA: Diagnosis present

## 2022-05-25 DIAGNOSIS — Z7984 Long term (current) use of oral hypoglycemic drugs: Secondary | ICD-10-CM

## 2022-05-25 DIAGNOSIS — Z90721 Acquired absence of ovaries, unilateral: Secondary | ICD-10-CM

## 2022-05-25 DIAGNOSIS — Z20822 Contact with and (suspected) exposure to covid-19: Secondary | ICD-10-CM | POA: Diagnosis present

## 2022-05-25 DIAGNOSIS — M109 Gout, unspecified: Secondary | ICD-10-CM | POA: Diagnosis present

## 2022-05-25 DIAGNOSIS — E6609 Other obesity due to excess calories: Secondary | ICD-10-CM | POA: Diagnosis not present

## 2022-05-25 DIAGNOSIS — T380X5A Adverse effect of glucocorticoids and synthetic analogues, initial encounter: Secondary | ICD-10-CM | POA: Diagnosis present

## 2022-05-25 DIAGNOSIS — R0600 Dyspnea, unspecified: Secondary | ICD-10-CM | POA: Diagnosis present

## 2022-05-25 DIAGNOSIS — Z9079 Acquired absence of other genital organ(s): Secondary | ICD-10-CM

## 2022-05-25 DIAGNOSIS — J449 Chronic obstructive pulmonary disease, unspecified: Secondary | ICD-10-CM | POA: Diagnosis not present

## 2022-05-25 DIAGNOSIS — E119 Type 2 diabetes mellitus without complications: Secondary | ICD-10-CM

## 2022-05-25 DIAGNOSIS — Z7952 Long term (current) use of systemic steroids: Secondary | ICD-10-CM

## 2022-05-25 DIAGNOSIS — Z888 Allergy status to other drugs, medicaments and biological substances status: Secondary | ICD-10-CM

## 2022-05-25 DIAGNOSIS — E1165 Type 2 diabetes mellitus with hyperglycemia: Secondary | ICD-10-CM | POA: Diagnosis present

## 2022-05-25 DIAGNOSIS — Z743 Need for continuous supervision: Secondary | ICD-10-CM | POA: Diagnosis not present

## 2022-05-25 DIAGNOSIS — R0902 Hypoxemia: Secondary | ICD-10-CM | POA: Diagnosis not present

## 2022-05-25 DIAGNOSIS — J9611 Chronic respiratory failure with hypoxia: Secondary | ICD-10-CM | POA: Diagnosis not present

## 2022-05-25 LAB — COMPREHENSIVE METABOLIC PANEL
ALT: 28 U/L (ref 0–44)
AST: 30 U/L (ref 15–41)
Albumin: 3.8 g/dL (ref 3.5–5.0)
Alkaline Phosphatase: 75 U/L (ref 38–126)
Anion gap: 14 (ref 5–15)
BUN: 23 mg/dL (ref 8–23)
CO2: 26 mmol/L (ref 22–32)
Calcium: 9.7 mg/dL (ref 8.9–10.3)
Chloride: 100 mmol/L (ref 98–111)
Creatinine, Ser: 1.36 mg/dL — ABNORMAL HIGH (ref 0.44–1.00)
GFR, Estimated: 41 mL/min — ABNORMAL LOW (ref >60)
Glucose, Bld: 139 mg/dL — ABNORMAL HIGH (ref 70–99)
Potassium: 3.8 mmol/L (ref 3.5–5.1)
Sodium: 140 mmol/L (ref 135–145)
Total Bilirubin: 0.6 mg/dL (ref 0.3–1.2)
Total Protein: 7.4 g/dL (ref 6.5–8.1)

## 2022-05-25 LAB — CBC WITH DIFFERENTIAL/PLATELET
Abs Immature Granulocytes: 0.1 10*3/uL — ABNORMAL HIGH (ref 0.00–0.07)
Basophils Absolute: 0.1 10*3/uL (ref 0.0–0.1)
Basophils Relative: 1 %
Eosinophils Absolute: 0.2 10*3/uL (ref 0.0–0.5)
Eosinophils Relative: 2 %
HCT: 48 % — ABNORMAL HIGH (ref 36.0–46.0)
Hemoglobin: 14.9 g/dL (ref 12.0–15.0)
Immature Granulocytes: 1 %
Lymphocytes Relative: 27 %
Lymphs Abs: 2.6 10*3/uL (ref 0.7–4.0)
MCH: 29.6 pg (ref 26.0–34.0)
MCHC: 31 g/dL (ref 30.0–36.0)
MCV: 95.4 fL (ref 80.0–100.0)
Monocytes Absolute: 1 10*3/uL (ref 0.1–1.0)
Monocytes Relative: 10 %
Neutro Abs: 5.8 10*3/uL (ref 1.7–7.7)
Neutrophils Relative %: 59 %
Platelets: 193 10*3/uL (ref 150–400)
RBC: 5.03 MIL/uL (ref 3.87–5.11)
RDW: 14.6 % (ref 11.5–15.5)
WBC: 9.8 10*3/uL (ref 4.0–10.5)
nRBC: 0 % (ref 0.0–0.2)

## 2022-05-25 LAB — TROPONIN I (HIGH SENSITIVITY)
Troponin I (High Sensitivity): 8 ng/L (ref <18)
Troponin I (High Sensitivity): 6 ng/L (ref <18)

## 2022-05-25 LAB — RESP PANEL BY RT-PCR (RSV, FLU A&B, COVID)  RVPGX2
Influenza A by PCR: NEGATIVE
Influenza B by PCR: NEGATIVE
Resp Syncytial Virus by PCR: NEGATIVE
SARS Coronavirus 2 by RT PCR: NEGATIVE

## 2022-05-25 LAB — BRAIN NATRIURETIC PEPTIDE: B Natriuretic Peptide: 67.5 pg/mL (ref 0.0–100.0)

## 2022-05-25 MED ORDER — INSULIN ASPART 100 UNIT/ML IJ SOLN
0.0000 [IU] | Freq: Three times a day (TID) | INTRAMUSCULAR | Status: DC
  Administered 2022-05-26: 15 [IU] via SUBCUTANEOUS
  Administered 2022-05-26: 8 [IU] via SUBCUTANEOUS
  Administered 2022-05-26: 15 [IU] via SUBCUTANEOUS
  Administered 2022-05-27: 8 [IU] via SUBCUTANEOUS
  Administered 2022-05-27: 15 [IU] via SUBCUTANEOUS
  Administered 2022-05-27: 3 [IU] via SUBCUTANEOUS

## 2022-05-25 MED ORDER — ALLOPURINOL 100 MG PO TABS
100.0000 mg | ORAL_TABLET | Freq: Two times a day (BID) | ORAL | Status: DC
  Administered 2022-05-25 – 2022-05-30 (×10): 100 mg via ORAL
  Filled 2022-05-25 (×10): qty 1

## 2022-05-25 MED ORDER — IPRATROPIUM BROMIDE 0.02 % IN SOLN
0.5000 mg | Freq: Three times a day (TID) | RESPIRATORY_TRACT | Status: DC
  Administered 2022-05-26 – 2022-05-27 (×5): 0.5 mg via RESPIRATORY_TRACT
  Filled 2022-05-25 (×5): qty 2.5

## 2022-05-25 MED ORDER — METOPROLOL TARTRATE 12.5 MG HALF TABLET
12.5000 mg | ORAL_TABLET | Freq: Two times a day (BID) | ORAL | Status: DC
  Administered 2022-05-25 – 2022-05-29 (×9): 12.5 mg via ORAL
  Filled 2022-05-25 (×9): qty 1

## 2022-05-25 MED ORDER — BUDESON-GLYCOPYRROL-FORMOTEROL 160-9-4.8 MCG/ACT IN AERO: 2.0000 | INHALATION_SPRAY | Freq: Two times a day (BID) | RESPIRATORY_TRACT | Status: DC

## 2022-05-25 MED ORDER — ROPINIROLE HCL 1 MG PO TABS
0.5000 mg | ORAL_TABLET | Freq: Every day | ORAL | Status: DC
  Administered 2022-05-25 – 2022-05-29 (×5): 0.5 mg via ORAL
  Filled 2022-05-25 (×5): qty 1

## 2022-05-25 MED ORDER — ASPIRIN 81 MG PO TBEC
81.0000 mg | DELAYED_RELEASE_TABLET | Freq: Every day | ORAL | Status: DC
  Administered 2022-05-25 – 2022-05-30 (×6): 81 mg via ORAL
  Filled 2022-05-25 (×6): qty 1

## 2022-05-25 MED ORDER — ACETAMINOPHEN 650 MG RE SUPP: 650.0000 mg | Freq: Four times a day (QID) | RECTAL | Status: DC | PRN

## 2022-05-25 MED ORDER — IPRATROPIUM-ALBUTEROL 0.5-2.5 (3) MG/3ML IN SOLN
3.0000 mL | Freq: Once | RESPIRATORY_TRACT | Status: AC
  Administered 2022-05-25: 3 mL via RESPIRATORY_TRACT
  Filled 2022-05-25: qty 3

## 2022-05-25 MED ORDER — METHYLPREDNISOLONE SODIUM SUCC 125 MG IJ SOLR
125.0000 mg | Freq: Once | INTRAMUSCULAR | Status: AC
  Administered 2022-05-25: 125 mg via INTRAVENOUS
  Filled 2022-05-25: qty 2

## 2022-05-25 MED ORDER — ROSUVASTATIN CALCIUM 20 MG PO TABS
20.0000 mg | ORAL_TABLET | Freq: Every day | ORAL | Status: DC
  Administered 2022-05-26 – 2022-05-30 (×5): 20 mg via ORAL
  Filled 2022-05-25 (×5): qty 1

## 2022-05-25 MED ORDER — MOMETASONE FURO-FORMOTEROL FUM 200-5 MCG/ACT IN AERO
2.0000 | INHALATION_SPRAY | Freq: Two times a day (BID) | RESPIRATORY_TRACT | Status: DC
  Administered 2022-05-26 – 2022-05-27 (×3): 2 via RESPIRATORY_TRACT
  Filled 2022-05-25: qty 8.8

## 2022-05-25 MED ORDER — ENOXAPARIN SODIUM 40 MG/0.4ML IJ SOSY
40.0000 mg | PREFILLED_SYRINGE | INTRAMUSCULAR | Status: DC
  Administered 2022-05-25 – 2022-05-29 (×5): 40 mg via SUBCUTANEOUS
  Filled 2022-05-25 (×5): qty 0.4

## 2022-05-25 MED ORDER — SODIUM CHLORIDE 0.9% FLUSH
3.0000 mL | Freq: Two times a day (BID) | INTRAVENOUS | Status: DC
  Administered 2022-05-25 – 2022-05-29 (×7): 3 mL via INTRAVENOUS

## 2022-05-25 MED ORDER — UMECLIDINIUM BROMIDE 62.5 MCG/ACT IN AEPB: 1.0000 | INHALATION_SPRAY | Freq: Every day | RESPIRATORY_TRACT | Status: DC

## 2022-05-25 MED ORDER — ALBUTEROL SULFATE (2.5 MG/3ML) 0.083% IN NEBU: 2.5000 mg | INHALATION_SOLUTION | RESPIRATORY_TRACT | Status: DC | PRN

## 2022-05-25 MED ORDER — IPRATROPIUM BROMIDE 0.02 % IN SOLN
0.5000 mg | Freq: Four times a day (QID) | RESPIRATORY_TRACT | Status: DC
  Administered 2022-05-25 (×2): 0.5 mg via RESPIRATORY_TRACT
  Filled 2022-05-25 (×2): qty 2.5

## 2022-05-25 MED ORDER — POLYETHYLENE GLYCOL 3350 17 G PO PACK: 17.0000 g | PACK | Freq: Every day | ORAL | Status: DC | PRN

## 2022-05-25 MED ORDER — PREDNISONE 20 MG PO TABS
40.0000 mg | ORAL_TABLET | Freq: Every day | ORAL | Status: AC
  Administered 2022-05-26 – 2022-05-30 (×5): 40 mg via ORAL
  Filled 2022-05-25 (×5): qty 2

## 2022-05-25 MED ORDER — FUROSEMIDE 40 MG PO TABS: 40.0000 mg | ORAL_TABLET | Freq: Every day | ORAL | Status: DC

## 2022-05-25 MED ORDER — ASPIRIN 81 MG PO TABS: 81.0000 mg | ORAL_TABLET | Freq: Every day | ORAL | Status: DC

## 2022-05-25 MED ORDER — ACETAMINOPHEN 325 MG PO TABS
650.0000 mg | ORAL_TABLET | Freq: Four times a day (QID) | ORAL | Status: DC | PRN
  Administered 2022-05-26 – 2022-05-27 (×3): 650 mg via ORAL
  Filled 2022-05-25 (×4): qty 2

## 2022-05-25 NOTE — Progress Notes (Signed)
Pt wears bipap QHS with 3L oxygen bleed in at home.

## 2022-05-25 NOTE — H&P (Signed)
History and Physical   Rebecca Mcintyre LEX:[id number] DOB: 03/24/1946 DOA: 05/25/2022  PCP: Rigoberto Noel, MD   Patient coming from: Home  Chief Complaint: Shortness of breath  HPI: Rebecca Mcintyre is a 76 y.o. female with medical history significant of RLS, obesity, CAD, hyperlipidemia, gout, COPD, hypertension, diabetes, CHF presenting shortness of breath.  Patient has had ongoing shortness of breath and was seen in the pulmonology clinic on 12/19 for COPD exacerbation.  At that time she was started on prednisone taper and Augmentin.  She had also been seen in previous to this.  She remained stable on treatment but did not improve and worsened when stopping treatment.  She has continue to have shortness of breath requiring 4 L, increased from baseline 3 L.  She has failed outpatient therapy.  She denies fevers, chills, chest pain, abdominal pain, constipation, diarrhea, nausea, vomiting.  ED Course: Vital signs in the ED significant for blood pressure in the 449Q to 759 systolic, requiring 3 to 4 L to maintain saturations.  Lab workup included CMP with creatinine elevated to 1.31 from baseline of 1, glucose 139.  CBC within normal limits.  Troponin normal with repeat pending.  BNP normal.  Respiratory panel for flu COVID and RSV negative.  Chest x-ray with increased interstitial markings representing atypical pneumonia versus edema.  Patient received Solu-Medrol and DuoNeb  in the ED.  Review of Systems: As per HPI otherwise all other systems reviewed and are negative.  Past Medical History:  Diagnosis Date   Acute on chronic respiratory failure with hypoxia and hypercapnia (HCC) 11/05/2018   Allergy    ASCUS on Pap smear    had repeat x 2 normal with [name] Plan 2011   Coronary artery disease    Gout    Hyperlipidemia    Hypertension    Myocardial infarction [facility])    2002 95% proximal LAD stenosis with thrombus followed by 90% stenosis, the first diagonal 80% stenosis, circumflex 30%  stenosis, circumflex obtuse marginal subbranch 70% stenosis, PDA 40% stenosis. She had a drug-eluting stent placement with a Pixel stent   Osteopenia    Solitary kidney     Past Surgical History:  Procedure Laterality Date   CARDIAC CATHETERIZATION     CORONARY ANGIOPLASTY     CORONARY ANGIOPLASTY WITH STENT PLACEMENT  2002   HEMORRHOID SURGERY  2011   LAD Stent angioplasty  2002   Right oophorectomy with salpingectomy     benign growth Dr. Joneen Caraway    Social History  reports that she quit smoking about 3 years ago. Her smoking use included cigarettes. She has a 39.00 pack-year smoking history. She has never used smokeless tobacco. She reports current alcohol use. She reports that she does not use drugs.  Allergies  Allergen Reactions   Atorvastatin     REACTION: myalgias  and liver    Family History  Problem Relation Age of Onset   Breast cancer Mother 56   Lung cancer Father    Heart attack Brother        Died age 10 MI   Colon cancer Brother    Heart disease Daughter 59       smoker and overweight    Esophageal cancer Neg Hx    Pancreatic cancer Neg Hx    Rectal cancer Neg Hx    Stomach cancer Neg Hx   Reviewed on admission  Prior to Admission medications   Medication Sig Start Date End Date Taking? Authorizing Provider  ACCU-CHEK GUIDE test strip USE AS DIRECTED TWICE A DAY 03/11/22   Panosh, Standley Brooking, MD  Accu-Chek Softclix Lancets lancets Use as instructed twice a day Diagnosis E11.9 02/07/21   Panosh, Standley Brooking, MD  acetaminophen (TYLENOL) 500 MG tablet Take 1,000 mg by mouth every 6 (six) hours as needed for mild pain.    [provider]  albuterol (PROVENTIL) (2.5 MG/3ML) 0.083% nebulizer solution Take 3 mLs (2.5 mg total) by nebulization every 6 (six) hours as needed for wheezing or shortness of breath. 03/02/20   Parrett, Fonnie Mu, NP  albuterol (VENTOLIN HFA) 108 (90 Base) MCG/ACT inhaler USE 2 PUFFS EVERY 6 HOURS AS NEEDED FOR WHEEZE OR SHORTNESS OF BREATH  03/14/22   Rigoberto Noel, MD  allopurinol (ZYLOPRIM) 100 MG tablet TAKE 1 TABLET BY MOUTH TWICE A DAY 01/07/22   Panosh, Standley Brooking, MD  amoxicillin-clavulanate (AUGMENTIN) 875-125 MG tablet Take 1 tablet by mouth 2 (two) times daily. 05/14/22   Margaretha Seeds, MD  aspirin 81 MG tablet Take 81 mg by mouth daily.    [provider]  azithromycin (ZITHROMAX) 250 MG tablet Take 1 tablet (250 mg total) by mouth as directed. 04/30/22   Rigoberto Noel, MD  Blood Glucose Monitoring Suppl (ACCU-CHEK GUIDE) w/Device KIT Use as directed twice a day Diagnosis E11.9 02/07/21   Panosh, Standley Brooking, MD  Budeson-Glycopyrrol-Formoterol (BREZTRI AEROSPHERE) 160-9-4.8 MCG/ACT AERO Inhale 2 puffs into the lungs in the morning and at bedtime. 08/03/21   Rigoberto Noel, MD  Budeson-Glycopyrrol-Formoterol (BREZTRI AEROSPHERE) 160-9-4.8 MCG/ACT AERO Inhale 2 puffs into the lungs in the morning and at bedtime. 09/25/21   Rigoberto Noel, MD  dapagliflozin propanediol (FARXIGA) 5 MG TABS tablet Take 1 tablet (5 mg total) by mouth daily before breakfast. 03/12/22   Panosh, Standley Brooking, MD  furosemide (LASIX) 40 MG tablet TAKE 1 TABLET BY MOUTH EVERY DAY 03/29/22   Panosh, Standley Brooking, MD  ketoconazole (NIZORAL) 2 % cream Apply 1 application topically 2 (two) times daily. 12/25/20   Panosh, Standley Brooking, MD  metFORMIN (GLUCOPHAGE) 500 MG tablet TAKE 1 TABLET BY MOUTH TWICE A DAY WITH A MEAL INCREASE TO 2 TABLETS TWICE A DAY WITH A MEAL 05/21/22   Panosh, Standley Brooking, MD  metoprolol tartrate (LOPRESSOR) 25 MG tablet TAKE 1/2 TABLETS BY MOUTH 2 TIMES DAILY 03/29/22   Panosh, Standley Brooking, MD  nitroGLYCERIN (NITROSTAT) 0.4 MG SL tablet PLACE 1 TABLET UNDER THE TONGUE EVERY 5 MINUTES AS NEEDED. 04/26/20   Panosh, Standley Brooking, MD  potassium chloride (KLOR-CON M10) 10 MEQ tablet TAKE 1 TABLET BY MOUTH TWICE A DAY 04/22/22   Panosh, Standley Brooking, MD  predniSONE (DELTASONE) 10 MG tablet Take 1 tablet (10 mg total) by mouth daily with breakfast. 04/30/22   Rigoberto Noel,  MD  predniSONE (DELTASONE) 10 MG tablet Take 2 tablets (20 mg total) by mouth daily with breakfast. 04/30/22   Rigoberto Noel, MD  rOPINIRole (REQUIP) 0.5 MG tablet TAKE 1 TABLET AT BEDTIME FOR RESTLESS LEG 05/14/22   Rigoberto Noel, MD  rosuvastatin (CRESTOR) 20 MG tablet Take 1 tablet (20 mg total) by mouth daily. 03/15/22   Minus Breeding, MD  Spacer/Aero-Hold Chamber Bags MISC Please show patient how to use 10/03/21   Rigoberto Noel, MD    Physical Exam: Vitals:   05/25/22 1313 05/25/22 1316 05/25/22 1317 05/25/22 1455  BP:  120/68  (!) 128/58  Pulse:  90  86  Resp:  11  18  Temp:  98.5 F (36.9 C)    TempSrc:  Oral    SpO2: 93% 92%  91%  Weight:   106.6 kg   Height:   _0  (1.6 m)     Physical Exam Constitutional:      General: She is not in acute distress.    Appearance: Normal appearance.  HENT:     Head: Normocephalic and atraumatic.     Mouth/Throat:     Mouth: Mucous membranes are moist.     Pharynx: Oropharynx is clear.  Eyes:     Extraocular Movements: Extraocular movements intact.     Pupils: Pupils are equal, round, and reactive to light.  Cardiovascular:     Rate and Rhythm: Normal rate and regular rhythm.     Pulses: Normal pulses.     Heart sounds: Normal heart sounds.  Pulmonary:     Effort: Pulmonary effort is normal. No respiratory distress.     Breath sounds: Rales (Trace basilar) present.  Abdominal:     General: Bowel sounds are normal. There is no distension.     Palpations: Abdomen is soft.     Tenderness: There is no abdominal tenderness.  Musculoskeletal:        General: No swelling or deformity.  Skin:    General: Skin is warm and dry.  Neurological:     General: No focal deficit present.     Mental Status: Mental status is at baseline.    Labs on Admission: I have personally reviewed following labs and imaging studies  CBC: Recent Labs  Lab 05/25/22 1230  WBC 9.8  NEUTROABS 5.8  HGB 14.9  HCT 48.0*  MCV 95.4  PLT 193     Basic Metabolic Panel: Recent Labs  Lab 05/25/22 1230  NA 140  K 3.8  CL 100  CO2 26  GLUCOSE 139*  BUN 23  CREATININE 1.36*  CALCIUM 9.7    GFR: Estimated Creatinine Clearance: 41.8 mL/min (A) (by C-G formula based on SCr of 1.36 mg/dL (H)).  Liver Function Tests: Recent Labs  Lab 05/25/22 1230  AST 30  ALT 28  ALKPHOS 75  BILITOT 0.6  PROT 7.4  ALBUMIN 3.8    Urine analysis:    Component Value Date/Time   COLORURINE YELLOW 11/28/2020 1905   APPEARANCEUR HAZY (A) 11/28/2020 1905   LABSPEC >1.046 (H) 11/28/2020 1905   PHURINE 5.0 11/28/2020 1905   GLUCOSEU >=500 (A) 11/28/2020 1905   GLUCOSEU NEGATIVE 03/13/2010 0826   HGBUR NEGATIVE 11/28/2020 1905   HGBUR negative 03/08/2009 0802   BILIRUBINUR NEGATIVE 11/28/2020 1905   BILIRUBINUR negative 04/14/2013 1057   BILIRUBINUR n 03/31/2012 1124   KETONESUR NEGATIVE 11/28/2020 1905   PROTEINUR NEGATIVE 11/28/2020 1905   UROBILINOGEN 0.2 04/14/2013 1057   UROBILINOGEN 1.0 03/13/2010 0826   NITRITE NEGATIVE 11/28/2020 1905   LEUKOCYTESUR SMALL (A) 11/28/2020 1905    Radiological Exams on Admission: DG Chest Port 1 View  Result Date: 05/25/2022 CLINICAL DATA:  Shortness of breath EXAM: PORTABLE CHEST 1 VIEW COMPARISON:  01/14/2022 FINDINGS: Stable cardiomediastinal contours. Aortic atherosclerosis. Increased perihilar and bibasilar interstitial markings. No pleural effusion or pneumothorax. IMPRESSION: Increased perihilar and bibasilar interstitial markings, which may reflect edema or atypical/viral infection. Electronically Signed   By: Davina Poke D.O.   On: 05/25/2022 14:44    EKG: Not performed in the emergency department  Assessment/Plan Active Problems:   Hyperlipidemia   GOUT   OBESITY   Essential hypertension  CAD (coronary artery disease)   CHF (congestive heart failure) (HCC)   Diabetes mellitus type II, non insulin dependent (HCC)   Congestive heart failure (HCC)   Restless leg  syndrome   COPD (chronic obstructive pulmonary disease) (HCC)   Acute chronic respiratory failure with hypoxia COPD exacerbation > Known history of COPD recently treated for exacerbation outpatient in the pulmonology clinic.  With Augmentin, azithromycin, prednisone taper. > Has continued to require increased oxygen 4 L at home from her baseline of 2 L. > Chest x-ray with possible edema versus atypical infection.  Flu COVID RSV negative on initial screen.  No leukocytosis.  Possibility of alternative of respiratory virus causing this exacerbation so we will check full respiratory viral panel. - Monitor on telemetry - Continue supplemental oxygen wean as tolerated - Daily prednisone - Scheduled Atrovent - As needed albuterol - Continue home Breztri - Check RVP -Continue home BiPAP nightly  AKI > Creatinine elevated to 1.36 from baseline of around 1-1.1. > Holding off on additional fluids for now given she has trace rales on exam (likely secondary to pneumonia given normal BNP).  > Will continue to trend for now and consider holding home Lasix if this worsen/does not improve overnight. > Of note has congenital solitary kidney. - Trend renal function and electrolytes  Gout - Continue allopurinol  CAD Hyperlipidemia - Continue home aspirin, metoprolol, rosuvastatin  CHF > Last echo was in September with EF 60-65%, due to DD, normal RV function. - Continue home Lasix - Continue home metoprolol  RLS - Continue home ropinirole  Diabetes - SSI  Hypertension - Continue home Lasix, metoprolol  Obesity - Noted  DVT prophylaxis: Lovenox Code Status:   Full, discussed with patient Family Communication:  Updated at bedside Disposition Plan:   Patient is from:  Home  Anticipated DC to:  Home  Anticipated DC date:  3 days  Anticipated DC barriers: None  Consults called:  None Admission status:  Observation, telemetry  Severity of Illness: The appropriate patient status for  this patient is OBSERVATION. Observation status is judged to be reasonable and necessary in order to provide the required intensity of service to ensure the patient's safety. The patient's presenting symptoms, physical exam findings, and initial radiographic and laboratory data in the context of their medical condition is felt to place them at decreased risk for further clinical deterioration. Furthermore, it is anticipated that the patient will be medically stable for discharge from the hospital within 2 midnights of admission.    Marcelyn Bruins MD [facility]  How to contact the [facility] Attending or Consulting provider [place] or covering provider during after hours [place], for this patient?   Check the care team in [facility] and look for a) attending/consulting [facility] provider listed and b) the [facility] team listed Log into [email or web address] and use [place]'s universal password to access. If you do not have the password, please contact the hospital operator. Locate the [facility] provider you are looking for under [facility] and page to a number that you can be directly reached. If you still have difficulty reaching the provider, please page the [facility] (Director on Call) for the Hospitalists listed on amion for assistance.  05/25/2022, 3:50 PM

## 2022-05-25 NOTE — ED Triage Notes (Signed)
Pt here via [facility] from home for sob.  Hx of  COPD and CHF.  Just finished prednisone tx 3 days prior and noticed symptoms worsening.  Normally on 2L [place] but increased by ems to 4L.

## 2022-05-25 NOTE — ED Notes (Signed)
Provider at bedside at this time. Monitor is on and cycling

## 2022-05-25 NOTE — ED Notes (Signed)
Per Benjamine Mola from [facility], the order for the pathogen panel will be added onto the covid swab that ws sent previously

## 2022-05-25 NOTE — ED Provider Notes (Signed)
[facility] EMERGENCY DEPARTMENT Provider Note   CSN: [id number] Arrival date & time: 05/25/22  1301     History  Chief Complaint  Patient presents with   Shortness of Breath    Rebecca Mcintyre is a 76 y.o. female.  76 year old female with prior medical history as detailed below presents for evaluation.  Patient with longstanding history of COPD.  Patient reports that she just finished a steroid taper proximately 3 to 4 days ago.  She reports that her symptoms have worsened over the last 48 hours.  She reports increased shortness of breath especially with exertion.  She normally uses 2 L nasal cannula O2 at baseline.  She has had to increase her O2 requirement up to 4 L to stay comfortable at home.  The history is provided by the patient and medical records.       Home Medications Prior to Admission medications   Medication Sig Start Date End Date Taking? Authorizing Provider  ACCU-CHEK GUIDE test strip USE AS DIRECTED TWICE A DAY 03/11/22   Panosh, Standley Brooking, MD  Accu-Chek Softclix Lancets lancets Use as instructed twice a day Diagnosis E11.9 02/07/21   Panosh, Standley Brooking, MD  acetaminophen (TYLENOL) 500 MG tablet Take 1,000 mg by mouth every 6 (six) hours as needed for mild pain.    [provider]  albuterol (PROVENTIL) (2.5 MG/3ML) 0.083% nebulizer solution Take 3 mLs (2.5 mg total) by nebulization every 6 (six) hours as needed for wheezing or shortness of breath. 03/02/20   Parrett, Fonnie Mu, NP  albuterol (VENTOLIN HFA) 108 (90 Base) MCG/ACT inhaler USE 2 PUFFS EVERY 6 HOURS AS NEEDED FOR WHEEZE OR SHORTNESS OF BREATH 03/14/22   Rigoberto Noel, MD  allopurinol (ZYLOPRIM) 100 MG tablet TAKE 1 TABLET BY MOUTH TWICE A DAY 01/07/22   Panosh, Standley Brooking, MD  amoxicillin-clavulanate (AUGMENTIN) 875-125 MG tablet Take 1 tablet by mouth 2 (two) times daily. 05/14/22   Margaretha Seeds, MD  aspirin 81 MG tablet Take 81 mg by mouth daily.    [provider]   azithromycin (ZITHROMAX) 250 MG tablet Take 1 tablet (250 mg total) by mouth as directed. 04/30/22   Rigoberto Noel, MD  Blood Glucose Monitoring Suppl (ACCU-CHEK GUIDE) w/Device KIT Use as directed twice a day Diagnosis E11.9 02/07/21   Panosh, Standley Brooking, MD  Budeson-Glycopyrrol-Formoterol (BREZTRI AEROSPHERE) 160-9-4.8 MCG/ACT AERO Inhale 2 puffs into the lungs in the morning and at bedtime. 08/03/21   Rigoberto Noel, MD  Budeson-Glycopyrrol-Formoterol (BREZTRI AEROSPHERE) 160-9-4.8 MCG/ACT AERO Inhale 2 puffs into the lungs in the morning and at bedtime. 09/25/21   Rigoberto Noel, MD  dapagliflozin propanediol (FARXIGA) 5 MG TABS tablet Take 1 tablet (5 mg total) by mouth daily before breakfast. 03/12/22   Panosh, Standley Brooking, MD  furosemide (LASIX) 40 MG tablet TAKE 1 TABLET BY MOUTH EVERY DAY 03/29/22   Panosh, Standley Brooking, MD  ketoconazole (NIZORAL) 2 % cream Apply 1 application topically 2 (two) times daily. 12/25/20   Panosh, Standley Brooking, MD  metFORMIN (GLUCOPHAGE) 500 MG tablet TAKE 1 TABLET BY MOUTH TWICE A DAY WITH A MEAL INCREASE TO 2 TABLETS TWICE A DAY WITH A MEAL 05/21/22   Panosh, Standley Brooking, MD  metoprolol tartrate (LOPRESSOR) 25 MG tablet TAKE 1/2 TABLETS BY MOUTH 2 TIMES DAILY 03/29/22   Panosh, Standley Brooking, MD  nitroGLYCERIN (NITROSTAT) 0.4 MG SL tablet PLACE 1 TABLET UNDER THE TONGUE EVERY 5 MINUTES AS NEEDED. 04/26/20  Panosh, Standley Brooking, MD  potassium chloride (KLOR-CON M10) 10 MEQ tablet TAKE 1 TABLET BY MOUTH TWICE A DAY 04/22/22   Panosh, Standley Brooking, MD  predniSONE (DELTASONE) 10 MG tablet Take 1 tablet (10 mg total) by mouth daily with breakfast. 04/30/22   Rigoberto Noel, MD  predniSONE (DELTASONE) 10 MG tablet Take 2 tablets (20 mg total) by mouth daily with breakfast. 04/30/22   Rigoberto Noel, MD  rOPINIRole (REQUIP) 0.5 MG tablet TAKE 1 TABLET AT BEDTIME FOR RESTLESS LEG 05/14/22   Rigoberto Noel, MD  rosuvastatin (CRESTOR) 20 MG tablet Take 1 tablet (20 mg total) by mouth daily. 03/15/22   Minus Breeding, MD  Spacer/Aero-Hold Chamber Bags MISC Please show patient how to use 10/03/21   Rigoberto Noel, MD      Allergies    Atorvastatin    Review of Systems   Review of Systems  All other systems reviewed and are negative.   Physical Exam Updated Vital Signs BP 120/68 (BP Location: Right Arm)   Pulse 90   Temp 98.5 F (36.9 C) (Oral)   Resp 11   Ht 5' 3" (1.6 m)   Wt 106.6 kg   SpO2 92%   BMI 41.63 kg/m  Physical Exam Vitals and nursing note reviewed.  Constitutional:      General: She is not in acute distress.    Appearance: Normal appearance. She is well-developed.  HENT:     Head: Normocephalic and atraumatic.  Eyes:     Conjunctiva/sclera: Conjunctivae normal.     Pupils: Pupils are equal, round, and reactive to light.  Cardiovascular:     Rate and Rhythm: Normal rate and regular rhythm.     Heart sounds: Normal heart sounds.  Pulmonary:     Effort: Pulmonary effort is normal. No respiratory distress.     Breath sounds: Examination of the right-upper field reveals wheezing. Examination of the left-upper field reveals wheezing. Examination of the right-middle field reveals wheezing. Examination of the left-middle field reveals wheezing. Examination of the right-lower field reveals wheezing. Examination of the left-lower field reveals wheezing. Wheezing present.  Abdominal:     General: There is no distension.     Palpations: Abdomen is soft.     Tenderness: There is no abdominal tenderness.  Musculoskeletal:        General: No deformity. Normal range of motion.     Cervical back: Normal range of motion and neck supple.  Skin:    General: Skin is warm and dry.  Neurological:     General: No focal deficit present.     Mental Status: She is alert and oriented to person, place, and time.     ED Results / Procedures / Treatments   Labs (all labs ordered are listed, but only abnormal results are displayed) Labs Reviewed  CBC WITH DIFFERENTIAL/PLATELET -  Abnormal; Notable for the following components:      Result Value   HCT 48.0 (*)    Abs Immature Granulocytes 0.10 (*)    All other components within normal limits  RESP PANEL BY RT-PCR (RSV, FLU A&B, COVID)  [id number]  BRAIN NATRIURETIC PEPTIDE  COMPREHENSIVE METABOLIC PANEL  TROPONIN I (HIGH SENSITIVITY)    EKG EKG Interpretation  Date/Time:  Saturday May 25 2022 13:11:45 EST Ventricular Rate:  91 PR Interval:  193 QRS Duration: 106 QT Interval:  391 QTC Calculation: 482 R Axis:   88 Text Interpretation: Sinus rhythm Borderline right axis deviation Low voltage,  precordial leads Nonspecific T abnrm, anterolateral leads Confirmed by Dene Gentry [phone]) on 05/25/2022 1:13:17 PM  Radiology No results found.  Procedures Procedures    Medications Ordered in ED Medications  ipratropium-albuterol (DUONEB) 0.5-2.5 (3) MG/3ML nebulizer solution 3 mL (3 mLs Nebulization Given 05/25/22 1326)  methylPREDNISolone sodium succinate (SOLU-MEDROL) 125 mg/2 mL injection 125 mg (125 mg Intravenous Given 05/25/22 1336)    ED Course/ Medical Decision Making/ A&P                           Medical Decision Making Amount and/or Complexity of Data Reviewed Labs: ordered. Radiology: ordered.  Risk Prescription drug management. Decision regarding hospitalization.    Medical Screen Complete  This patient presented to the ED with complaint of shortness of breath.  This complaint involves an extensive number of treatment options. The initial differential diagnosis includes, but is not limited to, likely COPD exacerbation, metabolic abnormality, etc.  This presentation is: Acute, Chronic, Self-Limited, Previously Undiagnosed, Uncertain Prognosis, Complicated, Systemic Symptoms, and Threat to Life/Bodily Function  Patient with lungs and history of COPD.  Patient presents now with complaint of recurrent shortness of breath most notably with exertion.  Patient is on 2 L nasal cannula  at baseline.  She has had to increase her O2 to 4 L to be comfortable.  Patient reports several rounds of steroids over the last several weeks.  Her last course of steroids stopped this past Wednesday.  She immediately started to feel recurrent shortness of breath again.  Workup is suggestive of likely COPD exacerbation.  Patient would likely benefit from admission.  Case discussed with oncoming EDP Dr. Betsey Holiday.  Hospitalist service paged.  Additional history obtained: External records from outside sources obtained and reviewed including prior ED visits and prior Inpatient records.    Lab Tests:  I ordered and personally interpreted labs.  The pertinent results include: CBC, CMP, COVID, flu, troponin, BNP   Imaging Studies ordered:  I ordered imaging studies including chest x-ray I independently visualized and interpreted obtained imaging which showed NAD I agree with the radiologist interpretation.   Cardiac Monitoring:  The patient was maintained on a cardiac monitor.  I personally viewed and interpreted the cardiac monitor which showed an underlying rhythm of: NSR   Medicines ordered:  I ordered medication including Solu-Medrol for suspected COPD exacerbation Reevaluation of the patient after these medicines showed that the patient: improved    Problem List / ED Course:  Shortness of breath, suspected COPD exacerbation   Reevaluation:  After the interventions noted above, I reevaluated the patient and found that they have: improved  Disposition:  After consideration of the diagnostic results and the patients response to treatment, I feel that the patent would benefit from admission.          Final Clinical Impression(s) / ED Diagnoses Final diagnoses:  COPD exacerbation ([place])    Rx / DC Orders ED Discharge Orders     None         Valarie Merino, MD 05/25/22 [phone]

## 2022-05-26 DIAGNOSIS — I5032 Chronic diastolic (congestive) heart failure: Secondary | ICD-10-CM | POA: Diagnosis not present

## 2022-05-26 DIAGNOSIS — E6609 Other obesity due to excess calories: Secondary | ICD-10-CM

## 2022-05-26 DIAGNOSIS — I2510 Atherosclerotic heart disease of native coronary artery without angina pectoris: Secondary | ICD-10-CM | POA: Diagnosis not present

## 2022-05-26 DIAGNOSIS — J432 Centrilobular emphysema: Secondary | ICD-10-CM

## 2022-05-26 DIAGNOSIS — J9621 Acute and chronic respiratory failure with hypoxia: Secondary | ICD-10-CM | POA: Diagnosis not present

## 2022-05-26 DIAGNOSIS — Z6836 Body mass index (BMI) 36.0-36.9, adult: Secondary | ICD-10-CM

## 2022-05-26 LAB — CBC
HCT: 41 % (ref 36.0–46.0)
HCT: 43 % (ref 36.0–46.0)
Hemoglobin: 13.8 g/dL (ref 12.0–15.0)
Hemoglobin: 14.3 g/dL (ref 12.0–15.0)
MCH: 30.6 pg (ref 26.0–34.0)
MCH: 30.2 pg (ref 26.0–34.0)
MCHC: 33.7 g/dL (ref 30.0–36.0)
MCHC: 33.3 g/dL (ref 30.0–36.0)
MCV: 90.9 fL (ref 80.0–100.0)
MCV: 90.7 fL (ref 80.0–100.0)
Platelets: 177 10*3/uL (ref 150–400)
Platelets: 213 10*3/uL (ref 150–400)
RBC: 4.51 MIL/uL (ref 3.87–5.11)
RBC: 4.74 MIL/uL (ref 3.87–5.11)
RDW: 14.1 % (ref 11.5–15.5)
RDW: 14 % (ref 11.5–15.5)
WBC: 9.9 10*3/uL (ref 4.0–10.5)
WBC: 15.2 10*3/uL — ABNORMAL HIGH (ref 4.0–10.5)
nRBC: 0 % (ref 0.0–0.2)
nRBC: 0 % (ref 0.0–0.2)

## 2022-05-26 LAB — BASIC METABOLIC PANEL
Anion gap: 13 (ref 5–15)
BUN: 30 mg/dL — ABNORMAL HIGH (ref 8–23)
CO2: 28 mmol/L (ref 22–32)
Calcium: 9.7 mg/dL (ref 8.9–10.3)
Chloride: 94 mmol/L — ABNORMAL LOW (ref 98–111)
Creatinine, Ser: 1.52 mg/dL — ABNORMAL HIGH (ref 0.44–1.00)
GFR, Estimated: 36 mL/min — ABNORMAL LOW (ref >60)
Glucose, Bld: 362 mg/dL — ABNORMAL HIGH (ref 70–99)
Potassium: 4.4 mmol/L (ref 3.5–5.1)
Sodium: 135 mmol/L (ref 135–145)

## 2022-05-26 LAB — RESPIRATORY PANEL BY PCR

## 2022-05-26 LAB — GLUCOSE, CAPILLARY
Glucose-Capillary: 299 mg/dL — ABNORMAL HIGH (ref 70–99)
Glucose-Capillary: 314 mg/dL — ABNORMAL HIGH (ref 70–99)
Glucose-Capillary: 361 mg/dL — ABNORMAL HIGH (ref 70–99)
Glucose-Capillary: 362 mg/dL — ABNORMAL HIGH (ref 70–99)
Glucose-Capillary: 407 mg/dL — ABNORMAL HIGH (ref 70–99)

## 2022-05-26 NOTE — Progress Notes (Signed)
Pt has  a CPAP set up and ready at bedside, pt stated she will place on herself when ready.

## 2022-05-26 NOTE — Progress Notes (Signed)
PROGRESS NOTE    HAJER DWYER  ERD:[id number] DOB: [birth year] DOA: 05/25/2022 PCP: Rigoberto Noel, MD    Brief Narrative:  Rebecca Mcintyre is a 76 y.o. female with medical history significant of restless leg syndrome, obesity, CAD, hyperlipidemia, gout, COPD, hypertension, diabetes and congestive heart failure presented to hospital with shortness of breath.  Patient was seen in the pulmonary clinic on 05/14/2022 for COPD exacerbation and was started on prednisone taper and Augmentin.  After completion of the treatment she again worsened so she decided to come to the hospital.  She was needing 4 L of oxygen from her baseline 3 L.  She was mildly tachycardic in the ED with elevation of creatinine at 1.3 from baseline 1.0.   Respiratory panel for flu COVID and RSV negative.  Chest x-ray with increased interstitial markings representing atypical pneumonia versus edema.  Patient received Solu-Medrol and DuoNeb  in the ED and was considered for admission to the hospital for further evaluation and treatment.  Assessment and plan  Acute on chronic respiratory failure with hypoxia COPD exacerbation Known COPD with recent treatment by pulmonary as outpatient with Augmentin and azithromycin prednisone with increased oxygen demand at 4 L from her baseline 3 L.   Respiratory viral panel pending.  Continue nebulizers prednisone, BiPAP at nighttime.  Chest x-ray showed increased perihilar and bibasilar interstitial markings.  Patient is negative for RSV, flu and COVID.  No leukocytosis, fever.  Patient is still symptomatic with cough shortness of breath dyspnea but feels little better than yesterday.  Will continue to follow the patient and continue current level of care.  Patient follows up with Dr. [name] pulmonary as outpatient.  Spoke with the patient's daughter on the phone   AKI Creatinine elevated to 1.36 from baseline of around 1-1.1.  Creatinine today at 1.5.  Off with home Lasix.  Will continue to monitor  closely.  Off IV fluids.   Gout On allopurinol   CAD Hyperlipidemia -No acute issues.  Continue home aspirin, metoprolol, rosuvastatin   Chronic diastolic heart failure CHF > Last echo was in September with EF 60-65%, due to DD, normal RV function.  Continue to hold Lasix from home.  Continue metoprolol.   Restless leg syndrome Continue ropinirole   Diabetes Continue sliding scale insulin, Accu-Cheks., diabetic diet.   Hypertension - Continue home metoprolol.  Lasix on hold.  Will resume when renal function improving.   Obesity Body mass index is 41.63 kg/m. Benefit from weight loss as outpatient.     DVT prophylaxis: enoxaparin (LOVENOX) injection 40 mg Start: 05/25/22 1600   Code Status:     Code Status: Full Code  Disposition: Likely home in 1 to 2 days  Status is: Observation  The patient will require care spanning > 2 midnights and should be moved to inpatient because: Acute on chronic respiratory failure, need for BiPAP, COPD exacerbation,   Family Communication:  Spoke with the patient's daughter on the phone and updated her about the clinical condition of the patient.  Consultants:  None  Procedures:  None  Antimicrobials:  None  Anti-infectives (From admission, onward)    None       Subjective: Today, patient was seen and examined at bedside.  Still complains of cough shortness of breath especially on exertion with productive sputum which is clear.  Denies any fever or chills.  Was able to get out of the bed today.  Does not feel completely improved.  On 4 L of oxygen.  Usually has 3 L at home.  Objective: Vitals:   05/25/22 1859 05/25/22 2055 05/26/22 0517 05/26/22 0856  BP: (!) 146/61 135/62 132/70 123/67  Pulse: 99 95 77 92  Resp: _0 Temp: 97.9 F (36.6 C) 98.3 F (36.8 C) (!) 97.5 F (36.4 C) 97.9 F (36.6 C)  TempSrc: Oral Oral Oral Oral  SpO2: 93% 93% 92% 94%  Weight:      Height:        Intake/Output Summary (Last 24  hours) at 05/26/2022 1358 Last data filed at 05/26/2022 0600 Gross per 24 hour  Intake 720 ml  Output 1400 ml  Net -680 ml   Filed Weights   05/25/22 1317  Weight: 106.6 kg    Physical Examination: Body mass index is 41.63 kg/m.   General: Obese built, not in obvious distress, on nasal cannula oxygen at 4 L/min. HENT:   No scleral pallor or icterus noted. Oral mucosa is moist.  Chest:    Diminished breath sounds bilaterally.  Coarse breath sounds noted. CVS: S1 &S2 heard. No murmur.  Regular rate and rhythm. Abdomen: Soft, nontender, nondistended.  Bowel sounds are heard.   Extremities: No cyanosis, clubbing or edema.  Peripheral pulses are palpable. Psych: Alert, awake and oriented, normal mood CNS:  No cranial nerve deficits.  Power equal in all extremities.   Skin: Warm and dry.  No rashes noted.  Data Reviewed:   CBC: Recent Labs  Lab 05/25/22 1230 05/26/22 0324  WBC 9.8 9.9  NEUTROABS 5.8  --   HGB 14.9 13.8  HCT 48.0* 41.0  MCV 95.4 90.9  PLT 193 638    Basic Metabolic Panel: Recent Labs  Lab 05/25/22 1230 05/26/22 0324  NA 140 135  K 3.8 4.4  CL 100 94*  CO2 26 28  GLUCOSE 139* 362*  BUN 23 30*  CREATININE 1.36* 1.52*  CALCIUM 9.7 9.7    Liver Function Tests: Recent Labs  Lab 05/25/22 1230  AST 30  ALT 28  ALKPHOS 75  BILITOT 0.6  PROT 7.4  ALBUMIN 3.8     Radiology Studies: DG Chest Port 1 View  Result Date: 05/25/2022 CLINICAL DATA:  Shortness of breath EXAM: PORTABLE CHEST 1 VIEW COMPARISON:  01/14/2022 FINDINGS: Stable cardiomediastinal contours. Aortic atherosclerosis. Increased perihilar and bibasilar interstitial markings. No pleural effusion or pneumothorax. IMPRESSION: Increased perihilar and bibasilar interstitial markings, which may reflect edema or atypical/viral infection. Electronically Signed   By: Davina Poke D.O.   On: 05/25/2022 14:44      LOS: 0 days    Flora Lipps, MD [facility] Available via  Epic secure chat 7am-7pm After these hours, please refer to coverage provider listed on [email or web address] 05/26/2022, 1:58 PM

## 2022-05-26 NOTE — Plan of Care (Signed)
  Problem: Education: Goal: Knowledge of disease or condition will improve Outcome: Not Progressing Goal: Knowledge of the prescribed therapeutic regimen will improve Outcome: Not Progressing Goal: Individualized Educational Video(s) Outcome: Not Progressing   Problem: Activity: Goal: Ability to tolerate increased activity will improve Outcome: Not Progressing Goal: Will verbalize the importance of balancing activity with adequate rest periods Outcome: Not Progressing   Problem: Respiratory: Goal: Ability to maintain a clear airway will improve Outcome: Not Progressing Goal: Levels of oxygenation will improve Outcome: Not Progressing Goal: Ability to maintain adequate ventilation will improve Outcome: Not Progressing   Problem: Education: Goal: Ability to describe self-care measures that may prevent or decrease complications (Diabetes Survival Skills Education) will improve Outcome: Not Progressing Goal: Individualized Educational Video(s) Outcome: Not Progressing   Problem: Coping: Goal: Ability to adjust to condition or change in health will improve Outcome: Not Progressing   Problem: Fluid Volume: Goal: Ability to maintain a balanced intake and output will improve Outcome: Not Progressing   Problem: Health Behavior/Discharge Planning: Goal: Ability to identify and utilize available resources and services will improve Outcome: Not Progressing Goal: Ability to manage health-related needs will improve Outcome: Not Progressing   Problem: Metabolic: Goal: Ability to maintain appropriate glucose levels will improve Outcome: Not Progressing   Problem: Nutritional: Goal: Maintenance of adequate nutrition will improve Outcome: Not Progressing Goal: Progress toward achieving an optimal weight will improve Outcome: Not Progressing   Problem: Skin Integrity: Goal: Risk for impaired skin integrity will decrease Outcome: Not Progressing   Problem: Tissue Perfusion: Goal:  Adequacy of tissue perfusion will improve Outcome: Not Progressing   Problem: Education: Goal: Knowledge of General Education information will improve Description: Including pain rating scale, medication(s)/side effects and non-pharmacologic comfort measures Outcome: Not Progressing   Problem: Health Behavior/Discharge Planning: Goal: Ability to manage health-related needs will improve Outcome: Not Progressing   Problem: Clinical Measurements: Goal: Ability to maintain clinical measurements within normal limits will improve Outcome: Not Progressing Goal: Will remain free from infection Outcome: Not Progressing Goal: Diagnostic test results will improve Outcome: Not Progressing Goal: Respiratory complications will improve Outcome: Not Progressing Goal: Cardiovascular complication will be avoided Outcome: Not Progressing   Problem: Activity: Goal: Risk for activity intolerance will decrease Outcome: Not Progressing   Problem: Nutrition: Goal: Adequate nutrition will be maintained Outcome: Not Progressing   Problem: Coping: Goal: Level of anxiety will decrease Outcome: Not Progressing   Problem: Elimination: Goal: Will not experience complications related to bowel motility Outcome: Not Progressing Goal: Will not experience complications related to urinary retention Outcome: Not Progressing   Problem: Pain Managment: Goal: General experience of comfort will improve Outcome: Not Progressing   Problem: Safety: Goal: Ability to remain free from injury will improve Outcome: Not Progressing   Problem: Skin Integrity: Goal: Risk for impaired skin integrity will decrease Outcome: Not Progressing

## 2022-05-26 NOTE — Care Management Obs Status (Signed)
[place] NOTIFICATION   Patient Details  Name: CARIME DINKEL MRN: [id number] Date of Birth: 16-Sep-1945   Medicare Observation Status Notification Given:  Yes    Bartholomew Crews, RN 05/26/2022, 4:39 PM

## 2022-05-26 NOTE — Progress Notes (Deleted)
Synopsis: Referred for recent AECOPD by Panosh, Standley Brooking, MD  Subjective:   PATIENT ID: Rebecca Mcintyre GENDER: female DOB: Jul 09, 1945, MRN: [id number]  No chief complaint on file.  76yF wheelchair bound smoker COPD referred for bLVRS to [facility], chronic hypoxic hypercapnic respiratory failure on 3L O2 and NIV, CAD, diastolic CHF, obesity  Treated for AECOPD at ov with Loanne Drilling earlier in December with augmentin, pred taper  To ED 12/30  Otherwise pertinent review of systems is negative.  Past Medical History:  Diagnosis Date   Acute on chronic respiratory failure with hypoxia and hypercapnia (HCC) 11/05/2018   Allergy    ASCUS on Pap smear    had repeat x 2 normal with [name] Plan 2011   Coronary artery disease    Gout    Hyperlipidemia    Hypertension    Myocardial infarction [facility])    2002 95% proximal LAD stenosis with thrombus followed by 90% stenosis, the first diagonal 80% stenosis, circumflex 30% stenosis, circumflex obtuse marginal subbranch 70% stenosis, PDA 40% stenosis. She had a drug-eluting stent placement with a Pixel stent   Osteopenia    Solitary kidney      Family History  Problem Relation Age of Onset   Breast cancer Mother 56   Lung cancer Father    Heart attack Brother        Died age 53 MI   Colon cancer Brother    Heart disease Daughter 70       smoker and overweight    Esophageal cancer Neg Hx    Pancreatic cancer Neg Hx    Rectal cancer Neg Hx    Stomach cancer Neg Hx      Past Surgical History:  Procedure Laterality Date   CARDIAC CATHETERIZATION     CORONARY ANGIOPLASTY     CORONARY ANGIOPLASTY WITH STENT PLACEMENT  2002   HEMORRHOID SURGERY  2011   LAD Stent angioplasty  2002   Right oophorectomy with salpingectomy     benign growth Dr. Joneen Caraway    Social History   Socioeconomic History   Marital status: Single    Spouse name: Not on file   Number of children: 2   Years of education: Not on file   Highest education level: Not on  file  Occupational History   Occupation: semi retired    Fish farm manager: Clinical research associate  Tobacco Use   Smoking status: Former    Packs/day: 1.00    Years: 39.00    Total pack years: 39.00    Types: Cigarettes    Quit date: 11/04/2018    Years since quitting: 3.5   Smokeless tobacco: Never  Vaping Use   Vaping Use: Never used  Substance and Sexual Activity   Alcohol use: Yes    Comment: 1-2 per month   Drug use: No   Sexual activity: Not on file  Other Topics Concern   Not on file  Social History Narrative   Lives alone   Left Handed   Drinks 1-2 cups caffeine daily   Social Determinants of Health   Financial Resource Strain: Low Risk  (04/03/2022)   Overall Financial Resource Strain (CARDIA)    Difficulty of Paying Living Expenses: Not hard at all  Food Insecurity: No Food Insecurity (05/25/2022)   Hunger Vital Sign    Worried About Running Out of Food in the Last Year: Never true    Ran Out of Food in the Last Year: Never true  Transportation Needs: No Transportation Needs (  05/25/2022)   PRAPARE - Hydrologist (Medical): No    Lack of Transportation (Non-Medical): No  Physical Activity: Insufficiently Active (04/03/2022)   Exercise Vital Sign    Days of Exercise per Week: 5 days    Minutes of Exercise per Session: 20 min  Stress: No Stress Concern Present (04/03/2022)   [place]    Feeling of Stress : Not at all  Social Connections: Socially Isolated (04/03/2022)   Social Connection and Isolation Panel [NHANES]    Frequency of Communication with Friends and Family: More than three times a week    Frequency of Social Gatherings with Friends and Family: More than three times a week    Attends Religious Services: Never    Marine scientist or Organizations: No    Attends Archivist Meetings: Never    Marital Status: Divorced  Human resources officer Violence: Not At Risk  (05/25/2022)   Humiliation, Afraid, Rape, and Kick questionnaire    Fear of Current or Ex-Partner: No    Emotionally Abused: No    Physically Abused: No    Sexually Abused: No     Allergies  Allergen Reactions   Atorvastatin     REACTION: myalgias  and liver     Facility-Administered Medications Prior to Visit  Medication Dose Route Frequency Provider Last Rate Last Admin   acetaminophen (TYLENOL) tablet 650 mg  650 mg Oral Q6H PRN Marcelyn Bruins, MD   650 mg at 05/26/22 0025   Or   acetaminophen (TYLENOL) suppository 650 mg  650 mg Rectal Q6H PRN Marcelyn Bruins, MD       albuterol (PROVENTIL) (2.5 MG/3ML) 0.083% nebulizer solution 2.5 mg  2.5 mg Nebulization Q2H PRN Marcelyn Bruins, MD       allopurinol (ZYLOPRIM) tablet 100 mg  100 mg Oral BID Marcelyn Bruins, MD   100 mg at 05/26/22 0867   aspirin EC tablet 81 mg  81 mg Oral Daily Ventura Sellers, RPH   81 mg at 05/26/22 0903   enoxaparin (LOVENOX) injection 40 mg  40 mg Subcutaneous Q24H Marcelyn Bruins, MD   40 mg at 05/26/22 1622   insulin aspart (novoLOG) injection 0-15 Units  0-15 Units Subcutaneous TID WC Marcelyn Bruins, MD   8 Units at 05/26/22 1622   ipratropium (ATROVENT) nebulizer solution 0.5 mg  0.5 mg Nebulization TID Marcelyn Bruins, MD   0.5 mg at 05/26/22 1400   metoprolol tartrate (LOPRESSOR) tablet 12.5 mg  12.5 mg Oral BID Marcelyn Bruins, MD   12.5 mg at 05/26/22 6195   mometasone-formoterol (DULERA) 200-5 MCG/ACT inhaler 2 puff  2 puff Inhalation BID Ventura Sellers, RPH   2 puff at 05/26/22 0902   polyethylene glycol (MIRALAX / GLYCOLAX) packet 17 g  17 g Oral Daily PRN Marcelyn Bruins, MD       predniSONE (DELTASONE) tablet 40 mg  40 mg Oral Q breakfast Marcelyn Bruins, MD   40 mg at 05/26/22 0902   rOPINIRole (REQUIP) tablet 0.5 mg  0.5 mg Oral QHS Marcelyn Bruins, MD   0.5 mg at 05/25/22 2113   rosuvastatin (CRESTOR) tablet 20 mg  20 mg Oral Daily Marcelyn Bruins, MD   20 mg at 05/26/22 0902   sodium chloride flush (NS) 0.9 % injection 3 mL  3 mL Intravenous Q12H Marcelyn Bruins, MD  3 mL at 05/26/22 1000   Outpatient Medications Prior to Visit  Medication Sig Dispense Refill   ACCU-CHEK GUIDE test strip USE AS DIRECTED TWICE A DAY 100 strip 3   Accu-Chek Softclix Lancets lancets Use as instructed twice a day Diagnosis E11.9 100 each 3   acetaminophen (TYLENOL) 500 MG tablet Take 1,000 mg by mouth every 6 (six) hours as needed for mild pain.     albuterol (PROVENTIL) (2.5 MG/3ML) 0.083% nebulizer solution Take 3 mLs (2.5 mg total) by nebulization every 6 (six) hours as needed for wheezing or shortness of breath. 300 mL 11   albuterol (VENTOLIN HFA) 108 (90 Base) MCG/ACT inhaler USE 2 PUFFS EVERY 6 HOURS AS NEEDED FOR WHEEZE OR SHORTNESS OF BREATH (Patient taking differently: Inhale 2 puffs into the lungs every 6 (six) hours as needed for wheezing or shortness of breath.) 8.5 each 5   allopurinol (ZYLOPRIM) 100 MG tablet TAKE 1 TABLET BY MOUTH TWICE A DAY (Patient taking differently: Take 200 mg by mouth at bedtime.) 180 tablet 1   amoxicillin-clavulanate (AUGMENTIN) 875-125 MG tablet Take 1 tablet by mouth 2 (two) times daily. (Patient taking differently: Take 1 tablet by mouth 2 (two) times daily. Start date 05/14/22) 14 tablet 0   aspirin 81 MG tablet Take 81 mg by mouth daily.     azithromycin (ZITHROMAX) 250 MG tablet Take 1 tablet (250 mg total) by mouth as directed. (Patient not taking: Reported on 05/25/2022) 6 tablet 0   Blood Glucose Monitoring Suppl (ACCU-CHEK GUIDE) w/Device KIT Use as directed twice a day Diagnosis E11.9 1 kit 2   Budeson-Glycopyrrol-Formoterol (BREZTRI AEROSPHERE) 160-9-4.8 MCG/ACT AERO Inhale 2 puffs into the lungs in the morning and at bedtime. (Patient not taking: Reported on 05/25/2022) 10.7 g 5   Budeson-Glycopyrrol-Formoterol (BREZTRI AEROSPHERE) 160-9-4.8 MCG/ACT AERO Inhale 2 puffs into the lungs in the  morning and at bedtime. 10.7 g 11   calcium carbonate (TUMS EX) 750 MG chewable tablet Chew 750 mg by mouth daily as needed for heartburn.     dapagliflozin propanediol (FARXIGA) 10 MG TABS tablet Take 10 mg by mouth at bedtime.     dapagliflozin propanediol (FARXIGA) 5 MG TABS tablet Take 1 tablet (5 mg total) by mouth daily before breakfast. (Patient not taking: Reported on 05/25/2022) 90 tablet 3   furosemide (LASIX) 40 MG tablet TAKE 1 TABLET BY MOUTH EVERY DAY (Patient taking differently: Take 40 mg by mouth daily.) 90 tablet 0   ketoconazole (NIZORAL) 2 % cream Apply 1 application topically 2 (two) times daily. (Patient not taking: Reported on 05/25/2022) 30 g 0   metFORMIN (GLUCOPHAGE) 500 MG tablet TAKE 1 TABLET BY MOUTH TWICE A DAY WITH A MEAL INCREASE TO 2 TABLETS TWICE A DAY WITH A MEAL (Patient taking differently: Take 1,000 mg by mouth 2 (two) times daily with a meal.) 360 tablet 0   metoprolol tartrate (LOPRESSOR) 25 MG tablet TAKE 1/2 TABLETS BY MOUTH 2 TIMES DAILY (Patient taking differently: Take 25 mg by mouth daily. TAKE 1/2 TABLETS BY MOUTH 2 TIMES DAILY) 90 tablet 0   nitroGLYCERIN (NITROSTAT) 0.4 MG SL tablet PLACE 1 TABLET UNDER THE TONGUE EVERY 5 MINUTES AS NEEDED. (Patient taking differently: Place 0.4 mg under the tongue every 5 (five) minutes as needed for chest pain.) 25 tablet 1   Polyethylene Glycol 400 (VISINE DRY EYE RELIEF OP) Place 1 drop into both eyes daily as needed (dry eyes).     potassium chloride (KLOR-CON M10) 10 MEQ  tablet TAKE 1 TABLET BY MOUTH TWICE A DAY (Patient taking differently: Take 20 mEq by mouth at bedtime.) 180 tablet 0   predniSONE (DELTASONE) 10 MG tablet Take 1 tablet (10 mg total) by mouth daily with breakfast. (Patient not taking: Reported on 05/25/2022) 10 tablet 0   predniSONE (DELTASONE) 10 MG tablet Take 2 tablets (20 mg total) by mouth daily with breakfast. (Patient not taking: Reported on 05/25/2022) 10 tablet 0   rOPINIRole (REQUIP) 0.5  MG tablet TAKE 1 TABLET AT BEDTIME FOR RESTLESS LEG (Patient taking differently: Take 0.5 mg by mouth at bedtime. TAKE 1 TABLET AT BEDTIME FOR RESTLESS LEG) 90 tablet 0   rosuvastatin (CRESTOR) 20 MG tablet Take 1 tablet (20 mg total) by mouth daily. (Patient taking differently: Take 20 mg by mouth at bedtime.) 90 tablet 3   Spacer/Aero-Hold Chamber Bags MISC Please show patient how to use 1 each 0       Objective:   Physical Exam:  General appearance: 76 y.o., female, NAD, conversant  Eyes: anicteric sclerae; PERRL, tracking appropriately HENT: NCAT; MMM Neck: Trachea midline; no lymphadenopathy, no JVD Lungs: CTAB, no crackles, no wheeze, with normal respiratory effort CV: RRR, no murmur  Abdomen: Soft, non-tender; non-distended, BS present  Extremities: No peripheral edema, warm Skin: Normal turgor and texture; no rash Psych: Appropriate affect Neuro: Alert and oriented to person and place, no focal deficit     There were no vitals filed for this visit.   on *** LPM *** RA BMI Readings from Last 3 Encounters:  05/25/22 41.63 kg/m  05/14/22 41.71 kg/m  04/15/22 41.20 kg/m   Wt Readings from Last 3 Encounters:  05/25/22 235 lb (106.6 kg)  05/14/22 235 lb 7.2 oz (106.8 kg)  04/15/22 232 lb 9.6 oz (105.5 kg)     CBC    Component Value Date/Time   WBC 15.2 (H) 05/26/2022 1707   RBC 4.74 05/26/2022 1707   HGB 14.3 05/26/2022 1707   HCT 43.0 05/26/2022 1707   PLT 213 05/26/2022 1707   MCV 90.7 05/26/2022 1707   MCH 30.2 05/26/2022 1707   MCHC 33.3 05/26/2022 1707   RDW 14.0 05/26/2022 1707   LYMPHSABS 2.6 05/25/2022 1230   MONOABS 1.0 05/25/2022 1230   EOSABS 0.2 05/25/2022 1230   BASOSABS 0.1 05/25/2022 1230    ***  Chest Imaging: ***  Pulmonary Functions Testing Results:    Latest Ref Rng & Units 12/29/2018   11:55 AM  PFT Results  FVC-Pre L 2.28  P  FVC-Predicted Pre % 81  P  FVC-Post L 2.31  P  FVC-Predicted Post % 82  P  Pre FEV1/FVC % % 51  P   Post FEV1/FCV % % 52  P  FEV1-Pre L 1.17  P  FEV1-Predicted Pre % 55  P  FEV1-Post L 1.20  P  DLCO uncorrected ml/min/mmHg 9.62  P  DLCO UNC% % 51  P  DLVA Predicted % 67  P  TLC L 5.11  P  TLC % Predicted % 104  P  RV % Predicted % 128  P    P Preliminary result      Echocardiogram 02/15/22:    1. Left ventricular ejection fraction, by estimation, is 60 to 65%. The  left ventricle has normal function. The left ventricle has no regional  wall motion abnormalities. Left ventricular diastolic parameters are  consistent with Grade II diastolic  dysfunction (pseudonormalization).   2. Right ventricular systolic function is normal. The  right ventricular  size is normal. There is normal pulmonary artery systolic pressure. The  estimated right ventricular systolic pressure is 45.1 mmHg.   3. Left atrial size was mild to moderately dilated.   4. Right atrial size was mildly dilated.   5. The mitral valve is grossly normal. Trivial mitral valve  regurgitation. No evidence of mitral stenosis. Moderate mitral annular  calcification.   6. The aortic valve is tricuspid. There is mild calcification of the  aortic valve. There is mild thickening of the aortic valve. Aortic valve  regurgitation is not visualized. No aortic stenosis is present.   7. The inferior vena cava is normal in size with greater than 50%  respiratory variability, suggesting right atrial pressure of 3 mmHg.   Heart Catheterization: ***    Assessment & Plan:    Plan:      Maryjane Hurter, MD [facility] 05/26/2022 6:14 PM

## 2022-05-26 NOTE — Hospital Course (Signed)
Rebecca Mcintyre is a 76 y.o. female with medical history significant of restless leg syndrome, obesity, CAD, hyperlipidemia, gout, COPD, hypertension, diabetes and congestive heart failure presented to hospital with shortness of breath.  Patient was seen in the pulmonary clinic on 05/14/2022 for COPD exacerbation and was started on prednisone taper and Augmentin.  After completion of the treatment she again worsened so she decided to come to the hospital.  She was needing 4 L of oxygen from her baseline 2 L.  She was mildly tachycardic in the ED with elevation of creatinine at 1.3 from baseline 1.0.   Respiratory panel for flu COVID and RSV negative.  Chest x-ray with increased interstitial markings representing atypical pneumonia versus edema.  Patient received Solu-Medrol and DuoNeb  in the ED and was considered for admission to the hospital for further evaluation and treatment.   Acute chronic respiratory failure with hypoxia COPD exacerbation Known COPD with recent treatment by pulmonary as outpatient with Augmentin and azithromycin prednisone with increased oxygen demand at 4 L from her baseline 2 L.   Respiratory viral panel pending.  Continue nebulizers prednisone BiPAP at nighttime.  Chest x-ray showed increased perihilar and bibasilar interstitial markings.  Patient is negative for RSV, flu and COVID.  No leukocytosis, fever.    AKI > Creatinine elevated to 1.36 from baseline of around 1-1.1.  Creatinine today at 1.5.  Off with home Lasix.  Will continue to monitor closely.   Gout On allopurinol   CAD Hyperlipidemia -No acute issues.  Continue home aspirin, metoprolol, rosuvastatin   Chronic diastolic heart failure CHF > Last echo was in September with EF 60-65%, due to DD, normal RV function.  Blood with Lasix from home.  Continue metoprolol.   RLS Continue ropinirole   Diabetes - SSI   Hypertension - Continue home Lasix, metoprolol   Obesity - Noted

## 2022-05-27 ENCOUNTER — Encounter (HOSPITAL_BASED_OUTPATIENT_CLINIC_OR_DEPARTMENT_OTHER): Payer: Self-pay | Admitting: Pulmonary Disease

## 2022-05-27 DIAGNOSIS — Z87891 Personal history of nicotine dependence: Secondary | ICD-10-CM | POA: Diagnosis not present

## 2022-05-27 DIAGNOSIS — J9622 Acute and chronic respiratory failure with hypercapnia: Secondary | ICD-10-CM | POA: Diagnosis present

## 2022-05-27 DIAGNOSIS — Z955 Presence of coronary angioplasty implant and graft: Secondary | ICD-10-CM | POA: Diagnosis not present

## 2022-05-27 DIAGNOSIS — Z6841 Body Mass Index (BMI) 40.0 and over, adult: Secondary | ICD-10-CM | POA: Diagnosis not present

## 2022-05-27 DIAGNOSIS — I110 Hypertensive heart disease with heart failure: Secondary | ICD-10-CM | POA: Diagnosis present

## 2022-05-27 DIAGNOSIS — R0600 Dyspnea, unspecified: Secondary | ICD-10-CM | POA: Diagnosis present

## 2022-05-27 DIAGNOSIS — R0602 Shortness of breath: Secondary | ICD-10-CM | POA: Diagnosis present

## 2022-05-27 DIAGNOSIS — M109 Gout, unspecified: Secondary | ICD-10-CM | POA: Diagnosis present

## 2022-05-27 DIAGNOSIS — I2510 Atherosclerotic heart disease of native coronary artery without angina pectoris: Secondary | ICD-10-CM | POA: Diagnosis present

## 2022-05-27 DIAGNOSIS — K219 Gastro-esophageal reflux disease without esophagitis: Secondary | ICD-10-CM | POA: Diagnosis present

## 2022-05-27 DIAGNOSIS — E876 Hypokalemia: Secondary | ICD-10-CM | POA: Diagnosis present

## 2022-05-27 DIAGNOSIS — J432 Centrilobular emphysema: Secondary | ICD-10-CM | POA: Diagnosis not present

## 2022-05-27 DIAGNOSIS — T380X5A Adverse effect of glucocorticoids and synthetic analogues, initial encounter: Secondary | ICD-10-CM | POA: Diagnosis present

## 2022-05-27 DIAGNOSIS — Z20822 Contact with and (suspected) exposure to covid-19: Secondary | ICD-10-CM | POA: Diagnosis present

## 2022-05-27 DIAGNOSIS — J449 Chronic obstructive pulmonary disease, unspecified: Secondary | ICD-10-CM | POA: Diagnosis not present

## 2022-05-27 DIAGNOSIS — E785 Hyperlipidemia, unspecified: Secondary | ICD-10-CM | POA: Diagnosis present

## 2022-05-27 DIAGNOSIS — J9611 Chronic respiratory failure with hypoxia: Secondary | ICD-10-CM | POA: Diagnosis not present

## 2022-05-27 DIAGNOSIS — J9612 Chronic respiratory failure with hypercapnia: Secondary | ICD-10-CM | POA: Diagnosis not present

## 2022-05-27 DIAGNOSIS — I252 Old myocardial infarction: Secondary | ICD-10-CM | POA: Diagnosis not present

## 2022-05-27 DIAGNOSIS — N179 Acute kidney failure, unspecified: Secondary | ICD-10-CM | POA: Diagnosis present

## 2022-05-27 DIAGNOSIS — E1165 Type 2 diabetes mellitus with hyperglycemia: Secondary | ICD-10-CM | POA: Diagnosis present

## 2022-05-27 DIAGNOSIS — Z8249 Family history of ischemic heart disease and other diseases of the circulatory system: Secondary | ICD-10-CM | POA: Diagnosis not present

## 2022-05-27 DIAGNOSIS — M8580 Other specified disorders of bone density and structure, unspecified site: Secondary | ICD-10-CM | POA: Diagnosis present

## 2022-05-27 DIAGNOSIS — G2581 Restless legs syndrome: Secondary | ICD-10-CM | POA: Diagnosis present

## 2022-05-27 DIAGNOSIS — Q600 Renal agenesis, unilateral: Secondary | ICD-10-CM | POA: Diagnosis not present

## 2022-05-27 DIAGNOSIS — D72829 Elevated white blood cell count, unspecified: Secondary | ICD-10-CM | POA: Diagnosis not present

## 2022-05-27 DIAGNOSIS — J9621 Acute and chronic respiratory failure with hypoxia: Secondary | ICD-10-CM | POA: Diagnosis present

## 2022-05-27 DIAGNOSIS — J441 Chronic obstructive pulmonary disease with (acute) exacerbation: Secondary | ICD-10-CM | POA: Diagnosis present

## 2022-05-27 DIAGNOSIS — I5032 Chronic diastolic (congestive) heart failure: Secondary | ICD-10-CM | POA: Diagnosis present

## 2022-05-27 LAB — GLUCOSE, CAPILLARY
Glucose-Capillary: 160 mg/dL — ABNORMAL HIGH (ref 70–99)
Glucose-Capillary: 287 mg/dL — ABNORMAL HIGH (ref 70–99)
Glucose-Capillary: 406 mg/dL — ABNORMAL HIGH (ref 70–99)
Glucose-Capillary: 421 mg/dL — ABNORMAL HIGH (ref 70–99)
Glucose-Capillary: 345 mg/dL — ABNORMAL HIGH (ref 70–99)

## 2022-05-27 LAB — CBC
HCT: 41 % (ref 36.0–46.0)
HCT: 43.1 % (ref 36.0–46.0)
Hemoglobin: 13.6 g/dL (ref 12.0–15.0)
Hemoglobin: 14 g/dL (ref 12.0–15.0)
MCH: 30.4 pg (ref 26.0–34.0)
MCH: 29.7 pg (ref 26.0–34.0)
MCHC: 33.2 g/dL (ref 30.0–36.0)
MCHC: 32.5 g/dL (ref 30.0–36.0)
MCV: 91.5 fL (ref 80.0–100.0)
MCV: 91.3 fL (ref 80.0–100.0)
Platelets: 176 10*3/uL (ref 150–400)
Platelets: 220 10*3/uL (ref 150–400)
RBC: 4.48 MIL/uL (ref 3.87–5.11)
RBC: 4.72 MIL/uL (ref 3.87–5.11)
RDW: 13.9 % (ref 11.5–15.5)
RDW: 14.2 % (ref 11.5–15.5)
WBC: 12.3 10*3/uL — ABNORMAL HIGH (ref 4.0–10.5)
WBC: 11.6 10*3/uL — ABNORMAL HIGH (ref 4.0–10.5)
nRBC: 0 % (ref 0.0–0.2)
nRBC: 0 % (ref 0.0–0.2)

## 2022-05-27 LAB — BASIC METABOLIC PANEL
Anion gap: 7 (ref 5–15)
BUN: 34 mg/dL — ABNORMAL HIGH (ref 8–23)
CO2: 31 mmol/L (ref 22–32)
Calcium: 9.2 mg/dL (ref 8.9–10.3)
Chloride: 99 mmol/L (ref 98–111)
Creatinine, Ser: 1.28 mg/dL — ABNORMAL HIGH (ref 0.44–1.00)
GFR, Estimated: 44 mL/min — ABNORMAL LOW (ref >60)
Glucose, Bld: 208 mg/dL — ABNORMAL HIGH (ref 70–99)
Potassium: 4 mmol/L (ref 3.5–5.1)
Sodium: 137 mmol/L (ref 135–145)

## 2022-05-27 LAB — PROCALCITONIN: Procalcitonin: <0.1 ng/mL

## 2022-05-27 LAB — D-DIMER, QUANTITATIVE: D-Dimer, Quant: <0.27 ug/mL-FEU (ref 0.00–0.50)

## 2022-05-27 MED ORDER — REVEFENACIN 175 MCG/3ML IN SOLN
175.0000 ug | Freq: Every day | RESPIRATORY_TRACT | Status: DC
  Administered 2022-05-27 – 2022-05-30 (×4): 175 ug via RESPIRATORY_TRACT
  Filled 2022-05-27 (×4): qty 3

## 2022-05-27 MED ORDER — INSULIN ASPART 100 UNIT/ML IJ SOLN
15.0000 [IU] | INTRAMUSCULAR | Status: AC
  Administered 2022-05-27: 15 [IU] via SUBCUTANEOUS

## 2022-05-27 MED ORDER — BUDESONIDE 0.5 MG/2ML IN SUSP
0.5000 mg | Freq: Two times a day (BID) | RESPIRATORY_TRACT | Status: DC
  Administered 2022-05-27 – 2022-05-30 (×6): 0.5 mg via RESPIRATORY_TRACT
  Filled 2022-05-27 (×6): qty 2

## 2022-05-27 MED ORDER — ARFORMOTEROL TARTRATE 15 MCG/2ML IN NEBU
15.0000 ug | INHALATION_SOLUTION | Freq: Two times a day (BID) | RESPIRATORY_TRACT | Status: DC
  Administered 2022-05-27 – 2022-05-30 (×6): 15 ug via RESPIRATORY_TRACT
  Filled 2022-05-27 (×6): qty 2

## 2022-05-27 MED ORDER — FUROSEMIDE 10 MG/ML IJ SOLN
40.0000 mg | Freq: Once | INTRAMUSCULAR | Status: AC
  Administered 2022-05-27: 40 mg via INTRAVENOUS
  Filled 2022-05-27: qty 4

## 2022-05-27 MED ORDER — INSULIN ASPART 100 UNIT/ML IJ SOLN
0.0000 [IU] | Freq: Every day | INTRAMUSCULAR | Status: DC
  Administered 2022-05-27: 5 [IU] via SUBCUTANEOUS
  Administered 2022-05-28: 2 [IU] via SUBCUTANEOUS
  Administered 2022-05-29: 4 [IU] via SUBCUTANEOUS

## 2022-05-27 MED ORDER — INSULIN ASPART 100 UNIT/ML IJ SOLN
0.0000 [IU] | Freq: Three times a day (TID) | INTRAMUSCULAR | Status: DC
  Administered 2022-05-28: 11 [IU] via SUBCUTANEOUS
  Administered 2022-05-28 – 2022-05-29 (×2): 20 [IU] via SUBCUTANEOUS
  Administered 2022-05-29: 3 [IU] via SUBCUTANEOUS
  Administered 2022-05-29: 7 [IU] via SUBCUTANEOUS
  Administered 2022-05-30: 3 [IU] via SUBCUTANEOUS

## 2022-05-27 MED ORDER — GUAIFENESIN ER 600 MG PO TB12
1200.0000 mg | ORAL_TABLET | Freq: Two times a day (BID) | ORAL | Status: DC
  Administered 2022-05-27 – 2022-05-30 (×6): 1200 mg via ORAL
  Filled 2022-05-27 (×6): qty 2

## 2022-05-27 MED ORDER — PANTOPRAZOLE SODIUM 40 MG PO TBEC
40.0000 mg | DELAYED_RELEASE_TABLET | Freq: Every day | ORAL | Status: DC
  Administered 2022-05-27 – 2022-05-29 (×3): 40 mg via ORAL
  Filled 2022-05-27 (×3): qty 1

## 2022-05-27 MED ORDER — FUROSEMIDE 10 MG/ML IJ SOLN: 40.0000 mg | Freq: Once | INTRAMUSCULAR | Status: DC

## 2022-05-27 NOTE — Progress Notes (Signed)
O2 weaned to 2 L via N/C, no distress noted

## 2022-05-27 NOTE — Plan of Care (Signed)

## 2022-05-27 NOTE — Progress Notes (Signed)
  Transition of Care [facility]) Screening Note   Patient Details  Name: SEDONA WENK Date of Birth: 24-Sep-1945   [facility]) CM/SW Contact:    Cyndi Bender, RN Phone Number: 05/27/2022, 8:47 AM    Transition of Care [facility] has reviewed patient and no TOC needs have been identified at this time. We will continue to monitor patient advancement through interdisciplinary progression rounds. If new patient transition needs arise, please place a TOC consult.

## 2022-05-27 NOTE — Progress Notes (Signed)
Attending:   Seen and examined independently and assessment and plan formulated together with Kennieth Rad.  Subjective: 77 y/o female well known to the pulmonary office as she follows with Dr. [name] for many years presented here with worsening dyspnea, mucus production and mild hypoxemia over the last 3-4 weeks.  She has been seen by our office and has been treated with prednisone and two rounds of antibiotics. Prior to this had been dealing with severe COPD as best as possible with pulmonary rehab, NIMV at night, 3L O2 at home and [place].  Says that the mucus production (initially yellow, now clear) is a new problem for her.  Weight has been stable, no new leg swelling. No changes in her living environment, no new pets, etc.  Here she has been treated with steroids, bronchodilators, she requested a consult due to ongoing dyspnea.   Objective: Vitals:   05/26/22 2044 05/27/22 0521 05/27/22 1038 05/27/22 1617  BP: 128/65 (!) 112/49 115/69 131/67  Pulse: 79 73 81   Resp: _0 Temp: (!) 97.3 F (36.3 C) (!) 97.4 F (36.3 C) 97.7 F (36.5 C) 98.8 F (37.1 C)  TempSrc: Oral Oral Oral Oral  SpO2: 92% 96% 90% 94%  Weight:      Height:          Intake/Output Summary (Last 24 hours) at 05/27/2022 1618 Last data filed at 05/27/2022 1400 Gross per 24 hour  Intake 680 ml  Output 800 ml  Net -120 ml    General:  Chronically ill appearing resting comfortably in chair HENT: NCAT OP clear PULM: Diminished, poor air movement, normal effort CV: RRR, no mgr GI: BS+, soft, nontender MSK: normal bulk and tone Neuro: awake, alert, no distress, MAEW    CBC    Component Value Date/Time   WBC 11.6 (H) 05/27/2022 1416   RBC 4.72 05/27/2022 1416   HGB 14.0 05/27/2022 1416   HCT 43.1 05/27/2022 1416   PLT 220 05/27/2022 1416   MCV 91.3 05/27/2022 1416   MCH 29.7 05/27/2022 1416   MCHC 32.5 05/27/2022 1416   RDW 14.2 05/27/2022 1416   LYMPHSABS 2.6 05/25/2022 1230   MONOABS 1.0  05/25/2022 1230   EOSABS 0.2 05/25/2022 1230   BASOSABS 0.1 05/25/2022 1230    BMET    Component Value Date/Time   NA 137 05/27/2022 0346   NA 141 07/05/2021 1107   K 4.0 05/27/2022 0346   CL 99 05/27/2022 0346   CO2 31 05/27/2022 0346   GLUCOSE 208 (H) 05/27/2022 0346   GLUCOSE 121 (H) 03/25/2006 0850   BUN 34 (H) 05/27/2022 0346   BUN 18 07/05/2021 1107   CREATININE 1.28 (H) 05/27/2022 0346   CALCIUM 9.2 05/27/2022 0346   GFRNONAA 44 (L) 05/27/2022 0346   GFRAA >60 11/12/2018 0954    CXR images reviewed, no clear infiltrate  Impression/Plan: Acute on chronic respiratory failure with hypoxemia COPD exacerbation, prolonged Physical deconditioning AKI Gout  Discussion: Unclear what's lead to her decline in recent weeks.  Need to assess for infectious, inflammatory causes.  Check for PE.    Plan: Change to brovana/yupelri/pulmicort to see if this makes her feel any better Sputum culture for bacterial, fungal, afb Check d-dimer: if positive CT angiogram chest, if negative HRCT chest Add flutter Continue PT efforts Continue NIMV at night  Will follow  My cc time n/a minutes  Roselie Awkward, MD [facility] Pager: [phone] Cell: [phone] After 7pm: [phone]

## 2022-05-27 NOTE — Progress Notes (Signed)
PROGRESS NOTE    Rebecca Mcintyre  RCV:[id number] DOB: June 29, 1945 DOA: 05/25/2022 PCP: Rigoberto Noel, MD    Brief Narrative:  Rebecca Mcintyre is a 77 y.o. female with medical history significant of restless leg syndrome, obesity, CAD, hyperlipidemia, gout, COPD, hypertension, diabetes and congestive heart failure presented to hospital with shortness of breath.  Patient was seen in the pulmonary clinic on 05/14/2022 for COPD exacerbation and was started on prednisone taper and Augmentin.  After completion of the treatment she again worsened so she decided to come to the hospital.  She was needing 4 L of oxygen from her baseline 3 L.  She was mildly tachycardic in the ED with elevation of creatinine at 1.3 from baseline 1.0.   Respiratory panel for flu COVID and RSV negative.  Chest x-ray with increased interstitial markings representing atypical pneumonia versus edema.  Patient received Solu-Medrol and DuoNeb  in the ED and was considered for admission to the hospital for further evaluation and treatment.  Assessment and plan  Acute on chronic respiratory failure with hypoxia COPD exacerbation Known COPD with recent treatment by pulmonary as outpatient with Augmentin and azithromycin, prednisone with increased oxygen demand at 4 L from her baseline 3 L.   Respiratory viral panel negative.  Continue nebulizers prednisone.  Initially required BiPAP at night.  Chest x-ray showed increased perihilar and bibasilar interstitial markings.  Patient is negative for RSV, flu and COVID.  No leukocytosis, fever.  Patient still complains of being symptomatic especially on minimal exertion feels dyspneic and not much improved.   Patient follows up with Dr. [name] pulmonary as outpatient.  Spoke with the patient's daughter on the phone again today.  Requesting a pulmonary evaluation in-house.  Will consult pulmonary due to persistent symptoms and failed outpatient treatment.   AKI Creatinine today at 1.2.  Baseline  around 1-1.1.  Will give 1 dose of IV Lasix today.  Intake and output charting Daily weights.  Check levels in AM.    Gout On allopurinol   CAD/Hyperlipidemia -No acute issues.  Continue home aspirin, metoprolol, rosuvastatin   Chronic diastolic heart failure CHF > Last 2 D echo was in September, 2023 with EF 60-65%, due to DD, normal RV function.  Continue to hold Lasix from home.  Continue metoprolol.   Restless leg syndrome Continue ropinirole   Diabetes Continue sliding scale insulin, Accu-Cheks., diabetic diet.   Hypertension - Continue home metoprolol.  Lasix on hold.  Will resume when renal function improving.   Obesity Body mass index is 41.63 kg/m. Benefit from weight loss as outpatient.     DVT prophylaxis: enoxaparin (LOVENOX) injection 40 mg Start: 05/25/22 1600   Code Status:     Code Status: Full Code  Disposition: Likely home in 1 to 2 days  Status is: Observation  The patient will require care spanning > 2 midnights and should be moved to inpatient because: Acute on chronic respiratory failure, COPD exacerbation, pending clinical improvement   Family Communication:  Spoke with the patient's daughter on the phone and updated her about the clinical condition of the patient again today.  Consultants:  Pulmonary.  Procedures:  None  Antimicrobials:  None  Anti-infectives (From admission, onward)    None       Subjective: Today, patient was seen and examined at bedside.  Still complains of shortness of breath even on minimal exertion with some cough.  Was able to ambulate with physical therapy.  Since daughter on the phone.  Noted to have elevated leukocytosis.  No new fever.  Family requesting pulmonary evaluation.  Objective: Vitals:   05/26/22 1621 05/26/22 2044 05/27/22 0521 05/27/22 1038  BP: (!) 120/54 128/65 (!) 112/49 115/69  Pulse: 81 79 73 81  Resp: (!) _0 Temp: 98.5 F (36.9 C) (!) 97.3 F (36.3 C) (!) 97.4 F (36.3  C) 97.7 F (36.5 C)  TempSrc: Oral Oral Oral Oral  SpO2: 94% 92% 96% 90%  Weight:      Height:        Intake/Output Summary (Last 24 hours) at 05/27/2022 1345 Last data filed at 05/27/2022 0600 Gross per 24 hour  Intake 920 ml  Output 0 ml  Net 920 ml    Filed Weights   05/25/22 1317  Weight: 106.6 kg    Physical Examination: Body mass index is 41.63 kg/m.   General: Alert awake and Communicative, on nasal cannula oxygen at 4 L/min, not in obvious distress HENT:   No scleral pallor or icterus noted. Oral mucosa is moist.  Chest:   Diminished breath sounds bilaterally, coarse breath is noted. CVS: S1 &S2 heard. No murmur.  Regular rate and rhythm. Abdomen: Soft, nontender, nondistended.  Bowel sounds are heard.   Extremities: No cyanosis, clubbing or edema.  Peripheral pulses are palpable. Psych: Alert, awake and oriented, normal mood CNS:  No cranial nerve deficits.  Power equal in all extremities.   Skin: Warm and dry.  No rashes noted.  Data Reviewed:   CBC: Recent Labs  Lab 05/25/22 1230 05/26/22 0324 05/26/22 1707 05/27/22 0346  WBC 9.8 9.9 15.2* 12.3*  NEUTROABS 5.8  --   --   --   HGB 14.9 13.8 14.3 13.6  HCT 48.0* 41.0 43.0 41.0  MCV 95.4 90.9 90.7 91.5  PLT 193 177 213 176     Basic Metabolic Panel: Recent Labs  Lab 05/25/22 1230 05/26/22 0324 05/27/22 0346  NA 140 135 137  K 3.8 4.4 4.0  CL 100 94* 99  CO2 _1 GLUCOSE 139* 362* 208*  BUN 23 30* 34*  CREATININE 1.36* 1.52* 1.28*  CALCIUM 9.7 9.7 9.2     Liver Function Tests: Recent Labs  Lab 05/25/22 1230  AST 30  ALT 28  ALKPHOS 75  BILITOT 0.6  PROT 7.4  ALBUMIN 3.8      Radiology Studies: DG Chest Port 1 View  Result Date: 05/25/2022 CLINICAL DATA:  Shortness of breath EXAM: PORTABLE CHEST 1 VIEW COMPARISON:  01/14/2022 FINDINGS: Stable cardiomediastinal contours. Aortic atherosclerosis. Increased perihilar and bibasilar interstitial markings. No pleural effusion or  pneumothorax. IMPRESSION: Increased perihilar and bibasilar interstitial markings, which may reflect edema or atypical/viral infection. Electronically Signed   By: Davina Poke D.O.   On: 05/25/2022 14:44      LOS: 0 days    Flora Lipps, MD [facility] Available via Epic secure chat 7am-7pm After these hours, please refer to coverage provider listed on [email or web address] 05/27/2022, 1:45 PM

## 2022-05-27 NOTE — Evaluation (Signed)
Physical Therapy Evaluation Patient Details Name: Rebecca Mcintyre MRN: [id number] DOB: [birth year] Today's Date: 05/27/2022  History of Present Illness  Patient is a 77 y/o female who presents on 12/30 with SOB. Found to have acute on chronic respiratory failure with hypoxia and COPD exacerbation. CXR- edema vs atypical infection. Recent visit to pulmonary clinic on 12/19 for COPD exacerbation and placed on steroids with no improvement. PMH includes COPD, CHF, CAD, DM, MI, respiratory failure on 3L 02 baseline.  Clinical Impression  Patient presents with dyspnea at rest, worsened with exertion, decreased cardiorespiratory endurance, decreased activity tolerance and impaired mobility s/p above. Pt lives at home with her spouse and reports being independent for ADLs/IADLs at baseline. Pt wears 3-4L at baseline. Today, pt tolerated transfers and ambulation with supervision for safety. Sp02 dropped to 77% on 4L/min 02 [place], took a few mins to recover with seated rest break to >90%, 4/4 DOE.  Education re: energy conservation techniques, short bouts of activity with longer rest break, increasing activity while in the hospital (walking back and forth in room) and using IS. Likely pt will progress well once breathing improves. Will follow acutely to maximize independence and mobility prior to return home.     Recommendations for follow up therapy are one component of a multi-disciplinary discharge planning process, led by the attending physician.  Recommendations may be updated based on patient status, additional functional criteria and insurance authorization.  Follow Up Recommendations No PT follow up      Assistance Recommended at Discharge PRN  Patient can return home with the following  Assistance with cooking/housework;Help with stairs or ramp for entrance;A little help with bathing/dressing/bathroom    Equipment Recommendations None recommended by PT  Recommendations for Other Services        Functional Status Assessment Patient has had a recent decline in their functional status and demonstrates the ability to make significant improvements in function in a reasonable and predictable amount of time.     Precautions / Restrictions Precautions Precautions: Fall;Other (comment) Precaution Comments: watch 02 Restrictions Weight Bearing Restrictions: No      Mobility  Bed Mobility               General bed mobility comments: Up in chair upon PT arrival.    Transfers Overall transfer level: Needs assistance Equipment used: None Transfers: Sit to/from Stand Sit to Stand: Supervision           General transfer comment: Supervision for safety. Stood from [name].    Ambulation/Gait Ambulation/Gait assistance: Supervision Gait Distance (Feet): 80 Feet Assistive device: None Gait Pattern/deviations: Step-through pattern, Decreased step length - left, Decreased step length - right, Wide base of support Gait velocity: decreased Gait velocity interpretation: <1.31 ft/sec, indicative of household ambulator   General Gait Details: Slow, mostly steady gait holding onto rail for support, a few standing rest breaks. 3-4/4 DOE. Sp02 dropped to 77% on 4L/min 02 [place]. Took a few mins to recover.  Stairs            Wheelchair Mobility    Modified Rankin (Stroke Patients Only)       Balance Overall balance assessment: Mild deficits observed, not formally tested                                           Pertinent Vitals/Pain Pain Assessment Pain Assessment: No/denies pain  Home Living Family/patient expects to be discharged to:: Private residence Living Arrangements: Spouse/significant other Available Help at Discharge: Family;Available PRN/intermittently Type of Home: House Home Access: Stairs to enter Entrance Stairs-Rails: Left;Right;Can reach both Entrance Stairs-Number of Steps: 3   Home Layout: One level Home Equipment:  Conservation officer, nature (2 wheels);Shower seat - built in;Grab bars - tub/shower      Prior Function Prior Level of Function : Independent/Modified Independent             Mobility Comments: Independent, drives. Cooks/cleans. Wears 3-4L 02 at baseline. ADLs Comments: independent     Hand Dominance   Dominant Hand: Left    Extremity/Trunk Assessment   Upper Extremity Assessment Upper Extremity Assessment: Defer to OT evaluation    Lower Extremity Assessment Lower Extremity Assessment: Generalized weakness    Cervical / Trunk Assessment Cervical / Trunk Assessment: Normal  Communication   Communication: HOH  Cognition Arousal/Alertness: Awake/alert Behavior During Therapy: WFL for tasks assessed/performed Overall Cognitive Status: Within Functional Limits for tasks assessed                                          General Comments General comments (skin integrity, edema, etc.): Sp02 dropped to 77% on 4L/,min 02 [place], took a few mins to recover to >90%, 4/4 DOE.    Exercises     Assessment/Plan    PT Assessment Patient needs continued PT services  PT Problem List Decreased strength;Decreased mobility;Decreased activity tolerance;Cardiopulmonary status limiting activity       PT Treatment Interventions Therapeutic activities;Therapeutic exercise;Patient/family education;Gait training    PT Goals (Current goals can be found in the Care Plan section)  Acute Rehab PT Goals Patient Stated Goal: to be able to breathe and go home PT Goal Formulation: With patient Time For Goal Achievement: 06/10/22 Potential to Achieve Goals: Good    Frequency Min 3X/week     Co-evaluation               AM-PAC PT "6 Clicks" Mobility  Outcome Measure Help needed turning from your back to your side while in a flat bed without using bedrails?: None Help needed moving from lying on your back to sitting on the side of a flat bed without using bedrails?: None Help  needed moving to and from a bed to a chair (including a wheelchair)?: A Little Help needed standing up from a chair using your arms (e.g., wheelchair or bedside chair)?: A Little Help needed to walk in hospital room?: A Little Help needed climbing 3-5 steps with a railing? : A Little 6 Click Score: 20    End of Session Equipment Utilized During Treatment: Oxygen Activity Tolerance: Treatment limited secondary to medical complications (Comment) (drop in Sp02\) Patient left: in chair;with call bell/phone within reach Nurse Communication: Mobility status PT Visit Diagnosis: Muscle weakness (generalized) (M62.81);Difficulty in walking, not elsewhere classified (R26.2);Other (comment) (DOE)    Time: 4734-0370 PT Time Calculation (min) (ACUTE ONLY): 24 min   Charges:   PT Evaluation $PT Eval Moderate Complexity: 1 Mod PT Treatments $Therapeutic Exercise: 8-22 mins        Marisa Severin, PT, DPT Acute Rehabilitation Services Secure chat preferred Office [place] 05/27/2022, 10:44 AM

## 2022-05-27 NOTE — Progress Notes (Signed)
Patient current [id number]. This RN notified CN[place]ada Crane. MD place new orders.

## 2022-05-27 NOTE — Consult Note (Signed)
NAME:  GERMANY DODGEN, MRN:  [id number], DOB:  09/05/1945, LOS: 0 ADMISSION DATE:  05/25/2022, CONSULTATION DATE:  05/27/22 REFERRING MD:  Dr. Louanne Belton, CHIEF COMPLAINT:  SOB   History of Present Illness:   70 yoF with PMH significant for former smoker, chronic hypoxic and hypercarbic respiratory failure on 3-4L HOT and nocturnal NIV, HFpEF, cor pulmonale/ PH (resolved on most recent echo 01/2022), CAD, HTN, and obesity who was admitted to [facility] on 12/30 after failed out patient treatment of AECOPD.   Patient is followed in our clinic by Dr. [name].  Most recent visit tolerating nocturnal NIV well, maintained home breztri, and has referral to [facility] interventional pulmonology in June but was able to get an earlier appointment at [facility] for [facility] valve evaluation that's scheduled for 1/3.  She lives alone and independently, and does home pulmonary rehab.  States she never has issues with cough or sputum production but developed both with productive yellowish sputum at the beginning of December.  No issues with wheezing, or  LE swelling or pain.  She is very compliant with her daily lasix.  Some occasional acid reflux symptoms.  She was treated with azithromycin and steroid course 12/5 with minimal improvement, and then again treated 12/19 with augment and another steroid taper.  Reports ongoing cough and productive phlegm but now is clear.  No fever or chills.   She was admitted for further workup with negative respiratory viral panel, neg COVID, Flu, or RSV.  CXR showed increased perihilar and bibasilar interstitial markings, normal CBC w/diff since with development of leukocytosis, decreasing now, BNP 67, neg troponin hs, and slight AKI thefore her lasix was held.  Continued on steroids and inhalers without significant improvement therefore pulmonary consulted for further recommendations.  Remains on her baseline 3-4L [place].   Noted by PT to have exertional desaturations down to 77% on 4L [place] on ambulation today.  Lasix  resumed today given improving sCr.   Pertinent  Medical History  Former smoker, chronic hypoxic and hypercarbic respiratory failure on 3-4L [place] HOT, COPD, cor pulmonale, HFpEF, CAD, MI, HTN, HLD, DM, restless leg syndrome, gout, obesity   PFTs 12/29/2018>  FVC 2.31 (82), FEV1 1.20 (56), ratio 52, no bronchodilatory response, DLCO 51 %  Significant Hospital Events: Including procedures, antibiotic start and stop dates in addition to other pertinent events     Interim History / Subjective:   Objective   Blood pressure 115/69, pulse 81, temperature 97.7 F (36.5 C), temperature source Oral, resp. rate 17, height _0  (1.6 m), weight 106.6 kg, SpO2 90 %.        Intake/Output Summary (Last 24 hours) at 05/27/2022 1447 Last data filed at 05/27/2022 1400 Gross per 24 hour  Intake 680 ml  Output 800 ml  Net -120 ml   Filed Weights   05/25/22 1317  Weight: 106.6 kg   Examination: General:  Very pleasant older female sitting in bedside recliner in NAD HEENT: MM pink/moist Neuro: Aox 4, MAE CV: rr, distant but no obvious murmur PULM:  non labored, congested cough but np during my exam, diminished throughout, no wheeze, faint bibasilar rales, able to pull 700-1L on IS consistently  GI: obese, soft, bs+  Extremities: warm/dry, no LE edema  Skin: no rashes  Resolved Hospital Problem list    Assessment & Plan:   AECOPD Chronic hypoxic and hypercarbic respiratory failure  - agree with lasix as renally tolerated given AKI - check PCT now and trend, send sputum for  cx and AFB - leukocytosis may be related to steroids.  Remains afebrile.  monitor - check d-dimer, low probability of PE but has been more sedentary since December w/ exacerbation - cont prednisone 40 mg daily  - change inhaler to yulperi, brovanna, and pulmicort with prn albuterol - aggressive pulm hygiene, IS, add flutter and guaifenesin, and continue with PT.  Exertional desaturations not uncommon given prolonged illness and  underlying lung function - add PPI - plans for HRCT chest 05/28/22 for better evaluation of lungs - continue supplemental O2 for sat goal 88-94%, currently on her baseline 3-4 L.  Adding humidity to [place].  - cont nocturnal BiPAP - already has scheduled appt w/ Dr. [name] 1/9 at [place] per primary team.  PCCM will continue to follow.    Best Practice (right click and "Reselect all SmartList Selections" daily)  Per primary team.   Labs   CBC: Recent Labs  Lab 05/25/22 1230 05/26/22 0324 05/26/22 1707 05/27/22 0346 05/27/22 1416  WBC 9.8 9.9 15.2* 12.3* 11.6*  NEUTROABS 5.8  --   --   --   --   HGB 14.9 13.8 14.3 13.6 14.0  HCT 48.0* 41.0 43.0 41.0 43.1  MCV 95.4 90.9 90.7 91.5 91.3  PLT 193 177 213 176 161    Basic Metabolic Panel: Recent Labs  Lab 05/25/22 1230 05/26/22 0324 05/27/22 0346  NA 140 135 137  K 3.8 4.4 4.0  CL 100 94* 99  CO2 _0 GLUCOSE 139* 362* 208*  BUN 23 30* 34*  CREATININE 1.36* 1.52* 1.28*  CALCIUM 9.7 9.7 9.2   GFR: Estimated Creatinine Clearance: 44.4 mL/min (A) (by C-G formula based on SCr of 1.28 mg/dL (H)). Recent Labs  Lab 05/26/22 0324 05/26/22 1707 05/27/22 0346 05/27/22 1416  WBC 9.9 15.2* 12.3* 11.6*    Liver Function Tests: Recent Labs  Lab 05/25/22 1230  AST 30  ALT 28  ALKPHOS 75  BILITOT 0.6  PROT 7.4  ALBUMIN 3.8   No results for input(s): "LIPASE", "AMYLASE" in the last 168 hours. No results for input(s): "AMMONIA" in the last 168 hours.  ABG    Component Value Date/Time   PHART 7.47 (H) 02/06/2022 1130   PCO2ART 57 (H) 02/06/2022 1130   PO2ART 65 (L) 02/06/2022 1130   HCO3 41.5 (H) 02/06/2022 1130   TCO2 33 (H) 11/28/2020 1618   O2SAT 94.3 02/06/2022 1130     Coagulation Profile: No results for input(s): "INR", "PROTIME" in the last 168 hours.  Cardiac Enzymes: No results for input(s): "CKTOTAL", "CKMB", "CKMBINDEX", "TROPONINI" in the last 168 hours.  HbA1C: Hemoglobin A1C   Date/Time Value Ref Range Status  04/15/2022 12:18 PM 6.4 (A) 4.0 - 5.6 % Final   Hgb A1c MFr Bld  Date/Time Value Ref Range Status  04/10/2021 10:25 AM 7.5 (H) 4.6 - 6.5 % Final    Comment:    Glycemic Control Guidelines for People with Diabetes:Non Diabetic:  <6%Goal of Therapy: <7%Additional Action Suggested:  >8%   10/03/2020 09:42 AM 7.4 (H) 4.6 - 6.5 % Final    Comment:    Glycemic Control Guidelines for People with Diabetes:Non Diabetic:  <6%Goal of Therapy: <7%Additional Action Suggested:  >8%     CBG: Recent Labs  Lab 05/26/22 1153 05/26/22 1556 05/26/22 2051 05/27/22 0850 05/27/22 1130  GLUCAP 362* 299* 314* 160* 287*    Review of Systems:   Review of Systems  Constitutional:  Positive for malaise/fatigue. Negative for  chills and fever.  HENT:  Negative for congestion and sore throat.   Respiratory:  Positive for cough, sputum production and shortness of breath. Negative for hemoptysis and wheezing.   Cardiovascular:  Negative for chest pain and leg swelling.  Gastrointestinal:  Positive for heartburn. Negative for nausea and vomiting.  Neurological:  Negative for focal weakness.   Past Medical History:  She,  has a past medical history of Acute on chronic respiratory failure with hypoxia and hypercapnia ([place]) (11/05/2018), Allergy, ASCUS on Pap smear, Coronary artery disease, Gout, Hyperlipidemia, Hypertension, Myocardial infarction ([place]), Osteopenia, and Solitary kidney.   Surgical History:   Past Surgical History:  Procedure Laterality Date   CARDIAC CATHETERIZATION     CORONARY ANGIOPLASTY     CORONARY ANGIOPLASTY WITH STENT PLACEMENT  2002   HEMORRHOID SURGERY  2011   LAD Stent angioplasty  2002   Right oophorectomy with salpingectomy     benign growth Dr. Joneen Caraway     Social History:   reports that she quit smoking about 3 years ago. Her smoking use included cigarettes. She has a 39.00 pack-year smoking history. She has never used smokeless tobacco.  She reports current alcohol use. She reports that she does not use drugs.   Family History:  Her family history includes Breast cancer (age of onset: 72) in her mother; Colon cancer in her brother; Heart attack in her brother; Heart disease (age of onset: 75) in her daughter; Lung cancer in her father. There is no history of Esophageal cancer, Pancreatic cancer, Rectal cancer, or Stomach cancer.   Allergies Allergies  Allergen Reactions   Atorvastatin     REACTION: myalgias  and liver     Home Medications  Prior to Admission medications   Medication Sig Start Date End Date Taking? Authorizing Provider  acetaminophen (TYLENOL) 500 MG tablet Take 1,000 mg by mouth every 6 (six) hours as needed for mild pain.   Yes [provider]  albuterol (PROVENTIL) (2.5 MG/3ML) 0.083% nebulizer solution Take 3 mLs (2.5 mg total) by nebulization every 6 (six) hours as needed for wheezing or shortness of breath. 03/02/20  Yes Parrett, Tammy S, NP  albuterol (VENTOLIN HFA) 108 (90 Base) MCG/ACT inhaler USE 2 PUFFS EVERY 6 HOURS AS NEEDED FOR WHEEZE OR SHORTNESS OF BREATH Patient taking differently: Inhale 2 puffs into the lungs every 6 (six) hours as needed for wheezing or shortness of breath. 03/14/22  Yes Rigoberto Noel, MD  allopurinol (ZYLOPRIM) 100 MG tablet TAKE 1 TABLET BY MOUTH TWICE A DAY Patient taking differently: Take 200 mg by mouth at bedtime. 01/07/22  Yes Panosh, Standley Brooking, MD  amoxicillin-clavulanate (AUGMENTIN) 875-125 MG tablet Take 1 tablet by mouth 2 (two) times daily. Patient taking differently: Take 1 tablet by mouth 2 (two) times daily. Start date 05/14/22 05/14/22  Yes Margaretha Seeds, MD  aspirin 81 MG tablet Take 81 mg by mouth daily.   Yes [provider]  Budeson-Glycopyrrol-Formoterol (BREZTRI AEROSPHERE) 160-9-4.8 MCG/ACT AERO Inhale 2 puffs into the lungs in the morning and at bedtime. 09/25/21  Yes Rigoberto Noel, MD  calcium carbonate (TUMS EX) 750 MG chewable  tablet Chew 750 mg by mouth daily as needed for heartburn.   Yes [provider]  dapagliflozin propanediol (FARXIGA) 10 MG TABS tablet Take 10 mg by mouth at bedtime.   Yes [provider]  furosemide (LASIX) 40 MG tablet TAKE 1 TABLET BY MOUTH EVERY DAY Patient taking differently: Take 40 mg by mouth  daily. 03/29/22  Yes Panosh, Standley Brooking, MD  metFORMIN (GLUCOPHAGE) 500 MG tablet TAKE 1 TABLET BY MOUTH TWICE A DAY WITH A MEAL INCREASE TO 2 TABLETS TWICE A DAY WITH A MEAL Patient taking differently: Take 1,000 mg by mouth 2 (two) times daily with a meal. 05/21/22  Yes Panosh, Standley Brooking, MD  metoprolol tartrate (LOPRESSOR) 25 MG tablet TAKE 1/2 TABLETS BY MOUTH 2 TIMES DAILY Patient taking differently: Take 25 mg by mouth daily. TAKE 1/2 TABLETS BY MOUTH 2 TIMES DAILY 03/29/22  Yes Panosh, Standley Brooking, MD  nitroGLYCERIN (NITROSTAT) 0.4 MG SL tablet PLACE 1 TABLET UNDER THE TONGUE EVERY 5 MINUTES AS NEEDED. Patient taking differently: Place 0.4 mg under the tongue every 5 (five) minutes as needed for chest pain. 04/26/20  Yes Panosh, Standley Brooking, MD  Polyethylene Glycol 400 (VISINE DRY EYE RELIEF OP) Place 1 drop into both eyes daily as needed (dry eyes).   Yes [provider]  potassium chloride (KLOR-CON M10) 10 MEQ tablet TAKE 1 TABLET BY MOUTH TWICE A DAY Patient taking differently: Take 20 mEq by mouth at bedtime. 04/22/22  Yes Panosh, Standley Brooking, MD  rOPINIRole (REQUIP) 0.5 MG tablet TAKE 1 TABLET AT BEDTIME FOR RESTLESS LEG Patient taking differently: Take 0.5 mg by mouth at bedtime. TAKE 1 TABLET AT BEDTIME FOR RESTLESS LEG 05/14/22  Yes Rigoberto Noel, MD  rosuvastatin (CRESTOR) 20 MG tablet Take 1 tablet (20 mg total) by mouth daily. Patient taking differently: Take 20 mg by mouth at bedtime. 03/15/22  Yes Minus Breeding, MD  ACCU-CHEK GUIDE test strip USE AS DIRECTED TWICE A DAY 03/11/22   Panosh, Standley Brooking, MD  Accu-Chek Softclix Lancets lancets Use as instructed twice a day  Diagnosis E11.9 02/07/21   Panosh, Standley Brooking, MD  azithromycin (ZITHROMAX) 250 MG tablet Take 1 tablet (250 mg total) by mouth as directed. Patient not taking: Reported on 05/25/2022 04/30/22   Rigoberto Noel, MD  Blood Glucose Monitoring Suppl (ACCU-CHEK GUIDE) w/Device KIT Use as directed twice a day Diagnosis E11.9 02/07/21   Panosh, Standley Brooking, MD  Budeson-Glycopyrrol-Formoterol (BREZTRI AEROSPHERE) 160-9-4.8 MCG/ACT AERO Inhale 2 puffs into the lungs in the morning and at bedtime. Patient not taking: Reported on 05/25/2022 08/03/21   Rigoberto Noel, MD  dapagliflozin propanediol (FARXIGA) 5 MG TABS tablet Take 1 tablet (5 mg total) by mouth daily before breakfast. Patient not taking: Reported on 05/25/2022 03/12/22   Panosh, Standley Brooking, MD  ketoconazole (NIZORAL) 2 % cream Apply 1 application topically 2 (two) times daily. Patient not taking: Reported on 05/25/2022 12/25/20   Panosh, Standley Brooking, MD  predniSONE (DELTASONE) 10 MG tablet Take 1 tablet (10 mg total) by mouth daily with breakfast. Patient not taking: Reported on 05/25/2022 04/30/22   Rigoberto Noel, MD  predniSONE (DELTASONE) 10 MG tablet Take 2 tablets (20 mg total) by mouth daily with breakfast. Patient not taking: Reported on 05/25/2022 04/30/22   Rigoberto Noel, MD  Spacer/Aero-Hold Chamber Bags MISC Please show patient how to use 10/03/21   Rigoberto Noel, MD     Critical care time: n/a      F[facility] 05/27/2022, 4:41 PM  See Amion for pager If no response to pager, please call PCCM consult pager After 7:00 pm call Elink

## 2022-05-28 ENCOUNTER — Ambulatory Visit: Payer: Medicare Other | Admitting: Student

## 2022-05-28 ENCOUNTER — Inpatient Hospital Stay (HOSPITAL_COMMUNITY): Payer: Medicare Other

## 2022-05-28 DIAGNOSIS — I2510 Atherosclerotic heart disease of native coronary artery without angina pectoris: Secondary | ICD-10-CM | POA: Diagnosis not present

## 2022-05-28 DIAGNOSIS — E876 Hypokalemia: Secondary | ICD-10-CM | POA: Insufficient documentation

## 2022-05-28 DIAGNOSIS — J432 Centrilobular emphysema: Secondary | ICD-10-CM | POA: Diagnosis not present

## 2022-05-28 DIAGNOSIS — I5032 Chronic diastolic (congestive) heart failure: Secondary | ICD-10-CM | POA: Diagnosis not present

## 2022-05-28 DIAGNOSIS — J9621 Acute and chronic respiratory failure with hypoxia: Secondary | ICD-10-CM | POA: Diagnosis not present

## 2022-05-28 LAB — BASIC METABOLIC PANEL
Anion gap: 12 (ref 5–15)
BUN: 36 mg/dL — ABNORMAL HIGH (ref 8–23)
CO2: 30 mmol/L (ref 22–32)
Calcium: 8.8 mg/dL — ABNORMAL LOW (ref 8.9–10.3)
Chloride: 95 mmol/L — ABNORMAL LOW (ref 98–111)
Creatinine, Ser: 1.21 mg/dL — ABNORMAL HIGH (ref 0.44–1.00)
GFR, Estimated: 47 mL/min — ABNORMAL LOW (ref >60)
Glucose, Bld: 117 mg/dL — ABNORMAL HIGH (ref 70–99)
Potassium: 3.4 mmol/L — ABNORMAL LOW (ref 3.5–5.1)
Sodium: 137 mmol/L (ref 135–145)

## 2022-05-28 LAB — GLUCOSE, CAPILLARY
Glucose-Capillary: 119 mg/dL — ABNORMAL HIGH (ref 70–99)
Glucose-Capillary: 260 mg/dL — ABNORMAL HIGH (ref 70–99)
Glucose-Capillary: 378 mg/dL — ABNORMAL HIGH (ref 70–99)
Glucose-Capillary: 244 mg/dL — ABNORMAL HIGH (ref 70–99)

## 2022-05-28 LAB — CBC
HCT: 41.4 % (ref 36.0–46.0)
Hemoglobin: 13.9 g/dL (ref 12.0–15.0)
MCH: 30.4 pg (ref 26.0–34.0)
MCHC: 33.6 g/dL (ref 30.0–36.0)
MCV: 90.6 fL (ref 80.0–100.0)
Platelets: 169 10*3/uL (ref 150–400)
RBC: 4.57 MIL/uL (ref 3.87–5.11)
RDW: 14.2 % (ref 11.5–15.5)
WBC: 11.7 10*3/uL — ABNORMAL HIGH (ref 4.0–10.5)
nRBC: 0 % (ref 0.0–0.2)

## 2022-05-28 LAB — MAGNESIUM: Magnesium: 2.7 mg/dL — ABNORMAL HIGH (ref 1.7–2.4)

## 2022-05-28 LAB — PROCALCITONIN: Procalcitonin: <0.1 ng/mL

## 2022-05-28 MED ORDER — POTASSIUM CHLORIDE CRYS ER 20 MEQ PO TBCR
40.0000 meq | EXTENDED_RELEASE_TABLET | Freq: Once | ORAL | Status: AC
  Administered 2022-05-28: 40 meq via ORAL
  Filled 2022-05-28: qty 2

## 2022-05-28 MED ORDER — FUROSEMIDE 40 MG PO TABS
40.0000 mg | ORAL_TABLET | Freq: Every day | ORAL | Status: DC
  Administered 2022-05-28 – 2022-05-30 (×3): 40 mg via ORAL
  Filled 2022-05-28 (×3): qty 1

## 2022-05-28 NOTE — Progress Notes (Signed)
NAME:  Rebecca Mcintyre, MRN:  [id number], DOB:  [birth year], LOS: 1 ADMISSION DATE:  05/25/2022, CONSULTATION DATE:  05/27/22 REFERRING MD:  Dr. Louanne Belton, CHIEF COMPLAINT:  SOB   History of Present Illness:   45 yoF with PMH significant for former smoker, chronic hypoxic and hypercarbic respiratory failure on 3-4L HOT and nocturnal NIV, HFpEF, cor pulmonale/ PH (resolved on most recent echo 01/2022), CAD, HTN, and obesity who was admitted to [facility] on 12/30 after failed out patient treatment of AECOPD.   Patient is followed in our clinic by Dr. [name].  Most recent visit tolerating nocturnal NIV well, maintained home breztri, and has referral to [facility] interventional pulmonology in June but was able to get an earlier appointment at [facility] for [facility] valve evaluation that's scheduled for 1/3.  She lives alone and independently, and does home pulmonary rehab.  States she never has issues with cough or sputum production but developed both with productive yellowish sputum at the beginning of December.  No issues with wheezing, or  LE swelling or pain.  She is very compliant with her daily lasix.  Some occasional acid reflux symptoms.  She was treated with azithromycin and steroid course 12/5 with minimal improvement, and then again treated 12/19 with augment and another steroid taper.  Reports ongoing cough and productive phlegm but now is clear.  No fever or chills.   She was admitted for further workup with negative respiratory viral panel, neg COVID, Flu, or RSV.  CXR showed increased perihilar and bibasilar interstitial markings, normal CBC w/diff since with development of leukocytosis, decreasing now, BNP 67, neg troponin hs, and slight AKI thefore her lasix was held.  Continued on steroids and inhalers without significant improvement therefore pulmonary consulted for further recommendations.  Remains on her baseline 3-4L [place].   Noted by PT to have exertional desaturations down to 77% on 4L [place] on ambulation today.  Lasix  resumed today given improving sCr.   Pertinent  Medical History  Former smoker, chronic hypoxic and hypercarbic respiratory failure on 3-4L [place] HOT, COPD, cor pulmonale, HFpEF, CAD, MI, HTN, HLD, DM, restless leg syndrome, gout, obesity   PFTs 12/29/2018>  FVC 2.31 (82), FEV1 1.20 (56), ratio 52, no bronchodilatory response, DLCO 51 %  Significant Hospital Events: Including procedures, antibiotic start and stop dates in addition to other pertinent events     Interim History / Subjective:  No significant improvement in symptoms Objective   Blood pressure (!) 130/57, pulse 87, temperature 97.6 F (36.4 C), temperature source Oral, resp. rate 18, height _0  (1.6 m), weight 106.6 kg, SpO2 95 %.    FiO2 (%):  [28 %] 28 %   Intake/Output Summary (Last 24 hours) at 05/28/2022 0931 Last data filed at 05/28/2022 0745 Gross per 24 hour  Intake 0 ml  Output 1500 ml  Net -1500 ml    Filed Weights   05/25/22 1317  Weight: 106.6 kg   Examination: General: Obese female no acute distress HEENT: MM pink/moist no JVD is appreciated Neuro: Grossly intact without focal defect CV: Heart sounds are distant PULM: Diminished in the bases   GI: soft, bsx4 active  GU: Extremities: warm/dry, voids 1+ edema  Skin: no rashes or lesions   Resolved Hospital Problem list    Assessment & Plan:   AECOPD Chronic hypoxic and hypercarbic respiratory failure  Diuresis as tolerated Follow culture data Schedule for CTA rule out PE Continue steroids Continue bronchodilators Continue O2 Continue nocturnal BiPAP Keep scheduled appointment  with Dr. Halford Chessman 06/04/2022 at 0 945     Remainder per primary team.  PCCM will continue to follow.    Best Practice (right click and "Reselect all SmartList Selections" daily)  Per primary team.   Labs   CBC: Recent Labs  Lab 05/25/22 1230 05/26/22 0324 05/26/22 1707 05/27/22 0346 05/27/22 1416  WBC 9.8 9.9 15.2* 12.3* 11.6*  NEUTROABS 5.8  --   --   --    --   HGB 14.9 13.8 14.3 13.6 14.0  HCT 48.0* 41.0 43.0 41.0 43.1  MCV 95.4 90.9 90.7 91.5 91.3  PLT 193 177 213 176 220     Basic Metabolic Panel: Recent Labs  Lab 05/25/22 1230 05/26/22 0324 05/27/22 0346 05/28/22 0531  NA 140 135 137 137  K 3.8 4.4 4.0 3.4*  CL 100 94* 99 95*  CO2 _0 GLUCOSE 139* 362* 208* 117*  BUN 23 30* 34* 36*  CREATININE 1.36* 1.52* 1.28* 1.21*  CALCIUM 9.7 9.7 9.2 8.8*  MG  --   --   --  2.7*    GFR: Estimated Creatinine Clearance: 47 mL/min (A) (by C-G formula based on SCr of 1.21 mg/dL (H)). Recent Labs  Lab 05/26/22 0324 05/26/22 1707 05/27/22 0346 05/27/22 1416 05/27/22 1710 05/28/22 0531  PROCALCITON  --   --   --   --  <0.10 <0.10  WBC 9.9 15.2* 12.3* 11.6*  --   --      Liver Function Tests: Recent Labs  Lab 05/25/22 1230  AST 30  ALT 28  ALKPHOS 75  BILITOT 0.6  PROT 7.4  ALBUMIN 3.8    No results for input(s): "LIPASE", "AMYLASE" in the last 168 hours. No results for input(s): "AMMONIA" in the last 168 hours.  ABG    Component Value Date/Time   PHART 7.47 (H) 02/06/2022 1130   PCO2ART 57 (H) 02/06/2022 1130   PO2ART 65 (L) 02/06/2022 1130   HCO3 41.5 (H) 02/06/2022 1130   TCO2 33 (H) 11/28/2020 1618   O2SAT 94.3 02/06/2022 1130     Coagulation Profile: No results for input(s): "INR", "PROTIME" in the last 168 hours.  Cardiac Enzymes: No results for input(s): "CKTOTAL", "CKMB", "CKMBINDEX", "TROPONINI" in the last 168 hours.  HbA1C: Hemoglobin A1C  Date/Time Value Ref Range Status  04/15/2022 12:18 PM 6.4 (A) 4.0 - 5.6 % Final   Hgb A1c MFr Bld  Date/Time Value Ref Range Status  04/10/2021 10:25 AM 7.5 (H) 4.6 - 6.5 % Final    Comment:    Glycemic Control Guidelines for People with Diabetes:Non Diabetic:  <6%Goal of Therapy: <7%Additional Action Suggested:  >8%   10/03/2020 09:42 AM 7.4 (H) 4.6 - 6.5 % Final    Comment:    Glycemic Control Guidelines for People with Diabetes:Non Diabetic:   <6%Goal of Therapy: <7%Additional Action Suggested:  >8%     CBG: Recent Labs  Lab 05/27/22 1130 05/27/22 1620 05/27/22 2118 05/27/22 2319 05/28/22 [place] Nica Friske ACNP Acute Care Nurse Practitioner [place] Please consult [place] 05/28/2022, 9:31 AM

## 2022-05-28 NOTE — Inpatient Diabetes Management (Signed)
Inpatient Diabetes Program Recommendations  AACE/ADA: New Consensus Statement on Inpatient Glycemic Control (2015)  Target Ranges:  Prepandial:   less than 140 mg/dL      Peak postprandial:   less than 180 mg/dL (1-2 hours)      Critically ill patients:  140 - 180 mg/dL   Lab Results  Component Value Date   GLUCAP 119 (H) 05/28/2022   HGBA1C 6.4 (A) 04/15/2022    Review of Glycemic Control  Latest Reference Range & Units 05/27/22 08:50 05/27/22 11:30 05/27/22 16:20 05/27/22 21:18 05/27/22 23:19 05/28/22 07:35  Glucose-Capillary 70 - 99 mg/dL 160 (H) 287 (H) 406 (H) 421 (H) 345 (H) 119 (H)   Diabetes history: DM 2 Outpatient Diabetes medications: Metformin 1000 mg bid (farxiga in the past) Current orders for Inpatient glycemic control:  Novolog 0-20 units tid + hs  PO Prednisone 40 mg Daily A1c 6.4% on 11/20  Inpatient Diabetes Program Recommendations:    Note: Glucose trends increase significantly after steroid dose and PO intake.  -  Add Novolog 5 units tid meal coverage if eating >50% of meals  Thanks,  Tama Headings RN, MSN, BC-ADM Inpatient Diabetes Coordinator Team Pager [phone] (8a-5p)

## 2022-05-28 NOTE — Progress Notes (Signed)
PROGRESS NOTE    SHAYNE DEERMAN  [id number] DOB: 02-11-1946 DOA: 05/25/2022 PCP: Rigoberto Noel, MD    Brief Narrative:  Rebecca Mcintyre is a 77 y.o. female with medical history significant of restless leg syndrome, obesity, CAD, hyperlipidemia, gout, COPD, hypertension, diabetes mellitus and congestive heart failure presented to the hospital with shortness of breath.  Patient was seen in the pulmonary clinic on 05/14/2022 for COPD exacerbation and was started on prednisone taper and Augmentin.  After completion of the treatment, she again worsened so she decided to come to the hospital.  She was needing 4 L of oxygen from her baseline 3 L.  She was mildly tachycardic in the ED with elevation of creatinine at 1.3 from baseline 1.0.   Respiratory panel for flu COVID and RSV negative.  Chest x-ray with increased interstitial markings representing atypical pneumonia versus edema.  Patient received Solu-Medrol and DuoNeb  in the ED and was considered for admission to the hospital for further evaluation and treatment.  During hospitalization, patient continued to have dyspnea on exertion with hypoxia on ambulation so pulmonary was consulted.  Of note patient was following up with pulmonary as outpatient.  At this time workup in progress.  Assessment and plan  Acute on chronic respiratory failure with hypoxia COPD exacerbation Known.  COPD with recent treatment by pulmonary as outpatient with Augmentin and azithromycin, prednisone with increased oxygen demand at 4 L from her baseline 3 L.   Respiratory viral panel negative.   Initially required BiPAP at night.  Chest x-ray showed increased perihilar and bibasilar interstitial markings.  Patient is negative for RSV, flu and COVID.  No leukocytosis, fever.  Patient still complains of being symptomatic especially on minimal exertion, feels dyspneic and and was severely hypoxic on ambulation.  She however feels little improved overall.  On oral diuretics at  home which has been resumed today.  Received 1 dose of IV Lasix..   Patient follows up with Dr. [name] pulmonary as outpatient.  Pulmonary was consulted due to unexplained hypoxia.  D-dimer negative so HRCT was done which did not show any fibrotic changes.  Continue nocturnal BiPAP.  Will follow pulmonary recommendations at this time.   AKI Creatinine today at 1.2.  Will continue to monitor while on oral diuretic.  Hypokalemia.  Potassium of 3.4 today.  Will replace orally.  Check levels in AM.  Gout On allopurinol continue   CAD/Hyperlipidemia -No acute issues.  Continue home aspirin, metoprolol, rosuvastatin   Chronic diastolic heart failure CHF > Last 2 D echo was in September, 2023 with EF 60-65%, due to DD, normal RV function.  Resume oral Lasix, continue metoprolol.   Restless leg syndrome, Continue ropinirole   Diabetes mellitus type II. Continue sliding scale insulin, Accu-Cheks., diabetic diet.   Hypertension - Continue home metoprolol.  Lasix on hold.  Will resume when renal function improving.   Morbid obesity Body mass index is 41.63 kg/m.  Would benefit from weight loss as outpatient.     DVT prophylaxis: enoxaparin (LOVENOX) injection 40 mg Start: 05/25/22 1600   Code Status:     Code Status: Full Code  Disposition: Likely home in 1 to 2 days, when okay with pulmonary.  Status is: Inpatient  The patient is  inpatient because: Acute on chronic respiratory failure, COPD exacerbation, pending clinical improvement, pulmonary workup.   Family Communication:  Spoke with the patient's daughter on the phone and updated her about the clinical condition of the patient on  05/27/2022  Consultants:  Pulmonary.  Procedures:  None  Antimicrobials:  None  Anti-infectives (From admission, onward)    None       Subjective: Today, patient was seen and examined at bedside.  Feels overall better but still shortness of breath especially on exertion.  Mild cough.  No  fever or chills.    Objective: Vitals:   05/27/22 2144 05/28/22 0554 05/28/22 0847 05/28/22 1059  BP:  129/68 (!) 130/57 (!) 115/59  Pulse:  60 87 78  Resp:  _0 Temp:  (!) 97.5 F (36.4 C) 97.6 F (36.4 C) 98.6 F (37 C)  TempSrc:   Oral Oral  SpO2: 98% 98% 95% 91%  Weight:      Height:        Intake/Output Summary (Last 24 hours) at 05/28/2022 1123 Last data filed at 05/28/2022 0956 Gross per 24 hour  Intake 240 ml  Output 1900 ml  Net -1660 ml    Filed Weights   05/25/22 1317  Weight: 106.6 kg    Physical Examination: Body mass index is 41.63 kg/m.   General: Alert awake and Communicative, on nasal cannula oxygen, not in obvious distress, obese built.   HENT:   No scleral pallor or icterus noted. Oral mucosa is moist.  Chest: Diminished breath sounds bilaterally. CVS: S1 &S2 heard. No murmur.  Regular rate and rhythm. Abdomen: Soft, nontender, nondistended.  Bowel sounds are heard.   Extremities: No cyanosis, clubbing trace edema..  Peripheral pulses are palpable. Psych: Alert, awake and oriented, normal mood CNS:  No cranial nerve deficits.  Power equal in all extremities.   Skin: Warm and dry.  No rashes noted.  Data Reviewed:   CBC: Recent Labs  Lab 05/25/22 1230 05/26/22 0324 05/26/22 1707 05/27/22 0346 05/27/22 1416  WBC 9.8 9.9 15.2* 12.3* 11.6*  NEUTROABS 5.8  --   --   --   --   HGB 14.9 13.8 14.3 13.6 14.0  HCT 48.0* 41.0 43.0 41.0 43.1  MCV 95.4 90.9 90.7 91.5 91.3  PLT 193 177 213 176 220     Basic Metabolic Panel: Recent Labs  Lab 05/25/22 1230 05/26/22 0324 05/27/22 0346 05/28/22 0531  NA 140 135 137 137  K 3.8 4.4 4.0 3.4*  CL 100 94* 99 95*  CO2 _1 GLUCOSE 139* 362* 208* 117*  BUN 23 30* 34* 36*  CREATININE 1.36* 1.52* 1.28* 1.21*  CALCIUM 9.7 9.7 9.2 8.8*  MG  --   --   --  2.7*     Liver Function Tests: Recent Labs  Lab 05/25/22 1230  AST 30  ALT 28  ALKPHOS 75  BILITOT 0.6  PROT 7.4  ALBUMIN  3.8      Radiology Studies: CT Chest High Resolution  Result Date: 05/28/2022 CLINICAL DATA:  Shortness of breath, unexplained hypoxia EXAM: CT CHEST WITHOUT CONTRAST TECHNIQUE: Multidetector CT imaging of the chest was performed following the standard protocol without intravenous contrast. High resolution imaging of the lungs, as well as inspiratory and expiratory imaging, was performed. RADIATION DOSE REDUCTION: This exam was performed according to the departmental dose-optimization program which includes automated exposure control, adjustment of the mA and/or kV according to patient size and/or use of iterative reconstruction technique. COMPARISON:  None Available. FINDINGS: Cardiovascular: Aortic atherosclerosis. Normal heart size. Three-vessel coronary artery calcifications. Enlargement of the main pulmonary artery measuring up to 3.9 cm in caliber. No pericardial effusion. Mediastinum/Nodes: No enlarged mediastinal, hilar,  or axillary lymph nodes. Thyroid gland, trachea, and esophagus demonstrate no significant findings. Lungs/Pleura: Severe emphysema. No evidence of fibrotic interstitial lung disease. Mild bland appearing dependent bibasilar scarring and or partial atelectasis. No significant air trapping on expiratory phase imaging. No pleural effusion or pneumothorax. Upper Abdomen: No acute abnormality. Coarse contour liver in the included upper abdomen. Musculoskeletal: No chest wall abnormality. No acute osseous findings. IMPRESSION: 1. No evidence of fibrotic interstitial lung disease. 2. Severe emphysema. 3. Enlargement of the main pulmonary artery, as can be seen in pulmonary hypertension. 4. Coronary artery disease. 5. Coarse contour liver in the included upper abdomen, suggestive of cirrhosis. Correlate with biochemical findings. Aortic Atherosclerosis (ICD10-I70.0) and Emphysema (ICD10-J43.9). Electronically Signed   By: Delanna Ahmadi M.D.   On: 05/28/2022 10:10      LOS: 1 day     Flora Lipps, MD [facility] Available via Epic secure chat 7am-7pm After these hours, please refer to coverage provider listed on [email or web address] 05/28/2022, 11:23 AM

## 2022-05-28 NOTE — Plan of Care (Signed)

## 2022-05-29 DIAGNOSIS — J9621 Acute and chronic respiratory failure with hypoxia: Secondary | ICD-10-CM | POA: Diagnosis not present

## 2022-05-29 LAB — BASIC METABOLIC PANEL
Anion gap: 10 (ref 5–15)
BUN: 36 mg/dL — ABNORMAL HIGH (ref 8–23)
CO2: 33 mmol/L — ABNORMAL HIGH (ref 22–32)
Calcium: 8.9 mg/dL (ref 8.9–10.3)
Chloride: 93 mmol/L — ABNORMAL LOW (ref 98–111)
Creatinine, Ser: 1.4 mg/dL — ABNORMAL HIGH (ref 0.44–1.00)
GFR, Estimated: 39 mL/min — ABNORMAL LOW (ref >60)
Glucose, Bld: 161 mg/dL — ABNORMAL HIGH (ref 70–99)
Potassium: 3.7 mmol/L (ref 3.5–5.1)
Sodium: 136 mmol/L (ref 135–145)

## 2022-05-29 LAB — GLUCOSE, CAPILLARY
Glucose-Capillary: 141 mg/dL — ABNORMAL HIGH (ref 70–99)
Glucose-Capillary: 210 mg/dL — ABNORMAL HIGH (ref 70–99)
Glucose-Capillary: 410 mg/dL — ABNORMAL HIGH (ref 70–99)
Glucose-Capillary: 313 mg/dL — ABNORMAL HIGH (ref 70–99)

## 2022-05-29 LAB — EXPECTORATED SPUTUM ASSESSMENT W GRAM STAIN, RFLX TO RESP C

## 2022-05-29 LAB — GLUCOSE, RANDOM: Glucose, Bld: 430 mg/dL — ABNORMAL HIGH (ref 70–99)

## 2022-05-29 LAB — MAGNESIUM: Magnesium: 2.8 mg/dL — ABNORMAL HIGH (ref 1.7–2.4)

## 2022-05-29 NOTE — Progress Notes (Signed)
PROGRESS NOTE    Rebecca Mcintyre  PQD:[id number] DOB: 11-Sep-1945 DOA: 05/25/2022 PCP: Rigoberto Noel, MD    Brief Narrative:  Rebecca Mcintyre is a 77 y.o. female with medical history significant of restless leg syndrome, obesity, CAD, hyperlipidemia, gout, COPD, hypertension, diabetes mellitus and congestive heart failure presented to the hospital with shortness of breath.  Patient was seen in the pulmonary clinic on 05/14/2022 for COPD exacerbation and was started on prednisone taper and Augmentin.  After completion of the treatment, she again worsened so she decided to come to the hospital.  She was needing 4 L of oxygen from her baseline 3 L.  She was mildly tachycardic in the ED with elevation of creatinine at 1.3 from baseline 1.0.   Respiratory panel for flu COVID and RSV negative.  Chest x-ray with increased interstitial markings representing atypical pneumonia versus edema.  Patient received Solu-Medrol and DuoNeb  in the ED and was considered for admission to the hospital for further evaluation and treatment.  During hospitalization, patient continued to have dyspnea on exertion with hypoxia on ambulation so pulmonary was consulted.  Of note patient was following up with pulmonary as outpatient.   Assessment and plan  Acute on chronic respiratory failure with hypoxia COPD exacerbation Known  COPD with recent treatment by pulmonary as outpatient with Augmentin and azithromycin, prednisone without supplemental oxygen.[name]   Respiratory viral panel negative.   Initially required BiPAP at night.  Chest x-ray showed increased perihilar and bibasilar interstitial markings.  Patient is negative for RSV, flu and COVID.  No leukocytosis, fever.     Patient follows up with Dr. [name] pulmonary as outpatient.  Pulmonary was consulted due to unexplained hypoxia and persistent symptoms on exertion..  D-dimer negative so HRCT was done which did not show any fibrotic changes.  Pulmonary has recommended  continuation of Brovana, Yupelri and Pulmicort nebulizer on discharge and did discontinue Breztri.  Does have a follow-up appointment on 06/04/2022 with Dr. [name] pulmonary.  AKI Creatinine today at 1.4.  Will continue to monitor while on oral diuretic.  Hypokalemia.  Potassium of 3.7 today.  Continue to replenish while on diuretic.  Gout On allopurinol continue   CAD/Hyperlipidemia -No acute issues.  Continue home aspirin, metoprolol, rosuvastatin   Chronic diastolic heart failure CHF Last 2 D echo was in September, 2023 with EF 60-65%, due to DD, normal RV function.  Continue oral Lasix, continue metoprolol.   Restless leg syndrome, Continue ropinirole   Diabetes mellitus type II. Continue sliding scale insulin, Accu-Cheks., diabetic diet.   Hypertension - Continue home metoprolol.  Blood pressure seems stable at this time.   Morbid obesity Body mass index is 41.71 kg/m.  Would benefit from weight loss as outpatient.     DVT prophylaxis: enoxaparin (LOVENOX) injection 40 mg Start: 05/25/22 1600   Code Status:     Code Status: Full Code  Disposition: Likely home on 05/30/2022.  Status is: Inpatient  The patient is  inpatient because: Acute on chronic respiratory failure, COPD exacerbation, pending clinical improvement   Family Communication:  Spoke with the patient's daughter on the phone and updated her about the clinical condition of the patient on 05/27/2022  Consultants:  Pulmonary.  Procedures:  None  Antimicrobials:  None  Anti-infectives (From admission, onward)    None       Subjective: Today, patient was seen and examined at bedside.  Little better with dyspnea on exertion.  Mild cough.  No nausea vomiting fever  chills or rigor.  Objective: Vitals:   05/29/22 0500 05/29/22 0625 05/29/22 0915 05/29/22 0925  BP:  127/60  123/68  Pulse:  73  77  Resp:  18  18  Temp:  (!) 97.5 F (36.4 C)  97.9 F (36.6 C)  TempSrc:  Oral  Oral  SpO2:  95% 96%  97%  Weight: 106.8 kg     Height:        Intake/Output Summary (Last 24 hours) at 05/29/2022 1408 Last data filed at 05/29/2022 1300 Gross per 24 hour  Intake 720 ml  Output 3100 ml  Net -2380 ml    Filed Weights   05/25/22 1317 05/29/22 0500  Weight: 106.6 kg 106.8 kg    Physical Examination: Body mass index is 41.71 kg/m.   General: Alert awake and Communicative, not in obvious distress, on nasal cannula oxygen, obese built.   HENT:   No scleral pallor or icterus noted. Oral mucosa is moist.  Chest: Diminished breath sounds bilaterally.  No obvious crackles or wheezes. CVS: S1 &S2 heard. No murmur.  Regular rate and rhythm. Abdomen: Soft, nontender, nondistended.  Bowel sounds are heard.   Extremities: No cyanosis, clubbing with trace edema..  Peripheral pulses are palpable. Psych: Alert, awake and oriented, normal mood CNS:  No cranial nerve deficits.  Power equal in all extremities.   Skin: Warm and dry.  No rashes noted.  Data Reviewed:   CBC: Recent Labs  Lab 05/25/22 1230 05/26/22 0324 05/26/22 1707 05/27/22 0346 05/27/22 1416 05/28/22 0531  WBC 9.8 9.9 15.2* 12.3* 11.6* 11.7*  NEUTROABS 5.8  --   --   --   --   --   HGB 14.9 13.8 14.3 13.6 14.0 13.9  HCT 48.0* 41.0 43.0 41.0 43.1 41.4  MCV 95.4 90.9 90.7 91.5 91.3 90.6  PLT 193 177 213 176 220 169     Basic Metabolic Panel: Recent Labs  Lab 05/25/22 1230 05/26/22 0324 05/27/22 0346 05/28/22 0531 05/29/22 0429  NA 140 135 137 137 136  K 3.8 4.4 4.0 3.4* 3.7  CL 100 94* 99 95* 93*  CO2 _0 33*  GLUCOSE 139* 362* 208* 117* 161*  BUN 23 30* 34* 36* 36*  CREATININE 1.36* 1.52* 1.28* 1.21* 1.40*  CALCIUM 9.7 9.7 9.2 8.8* 8.9  MG  --   --   --  2.7* 2.8*     Liver Function Tests: Recent Labs  Lab 05/25/22 1230  AST 30  ALT 28  ALKPHOS 75  BILITOT 0.6  PROT 7.4  ALBUMIN 3.8      Radiology Studies: CT Chest High Resolution  Result Date: 05/28/2022 CLINICAL DATA:  Shortness of  breath, unexplained hypoxia EXAM: CT CHEST WITHOUT CONTRAST TECHNIQUE: Multidetector CT imaging of the chest was performed following the standard protocol without intravenous contrast. High resolution imaging of the lungs, as well as inspiratory and expiratory imaging, was performed. RADIATION DOSE REDUCTION: This exam was performed according to the departmental dose-optimization program which includes automated exposure control, adjustment of the mA and/or kV according to patient size and/or use of iterative reconstruction technique. COMPARISON:  None Available. FINDINGS: Cardiovascular: Aortic atherosclerosis. Normal heart size. Three-vessel coronary artery calcifications. Enlargement of the main pulmonary artery measuring up to 3.9 cm in caliber. No pericardial effusion. Mediastinum/Nodes: No enlarged mediastinal, hilar, or axillary lymph nodes. Thyroid gland, trachea, and esophagus demonstrate no significant findings. Lungs/Pleura: Severe emphysema. No evidence of fibrotic interstitial lung disease. Mild bland appearing dependent bibasilar  scarring and or partial atelectasis. No significant air trapping on expiratory phase imaging. No pleural effusion or pneumothorax. Upper Abdomen: No acute abnormality. Coarse contour liver in the included upper abdomen. Musculoskeletal: No chest wall abnormality. No acute osseous findings. IMPRESSION: 1. No evidence of fibrotic interstitial lung disease. 2. Severe emphysema. 3. Enlargement of the main pulmonary artery, as can be seen in pulmonary hypertension. 4. Coronary artery disease. 5. Coarse contour liver in the included upper abdomen, suggestive of cirrhosis. Correlate with biochemical findings. Aortic Atherosclerosis (ICD10-I70.0) and Emphysema (ICD10-J43.9). Electronically Signed   By: Delanna Ahmadi M.D.   On: 05/28/2022 10:10      LOS: 2 days    Flora Lipps, MD [facility] Available via Epic secure chat 7am-7pm After these hours, please refer to  coverage provider listed on [email or web address] 05/29/2022, 2:08 PM

## 2022-05-29 NOTE — Progress Notes (Signed)
Physical Therapy Treatment Patient Details Name: Rebecca Mcintyre MRN: [id number] DOB: 07-10-1945 Today's Date: 05/29/2022   History of Present Illness Patient is a 77 y/o female who presents on 12/30 with SOB. Found to have acute on chronic respiratory failure with hypoxia and COPD exacerbation. CXR- edema vs atypical infection. Recent visit to pulmonary clinic on 12/19 for COPD exacerbation and placed on steroids with no improvement. PMH includes COPD, CHF, CAD, DM, MI, respiratory failure on 3L 02 baseline.    PT Comments    Continuing work on functional mobility and activity tolerance;  Session focused on progressive amb with close monitor of O2 sats;  O2sats dropped to 85% observed lowest -- quite an improvement from dropping to 77% last PT session; pt hopes to get home soon  Recommendations for follow up therapy are one component of a multi-disciplinary discharge planning process, led by the attending physician.  Recommendations may be updated based on patient status, additional functional criteria and insurance authorization.  Follow Up Recommendations  Home health PT     Assistance Recommended at Discharge PRN  Patient can return home with the following Assistance with cooking/housework;Help with stairs or ramp for entrance;A little help with bathing/dressing/bathroom   Equipment Recommendations  None recommended by PT    Recommendations for Other Services       Precautions / Restrictions Precautions Precautions: Fall;Other (comment) Precaution Comments: watch 02     Mobility  Bed Mobility               General bed mobility comments: Up in chair upon PT arrival.    Transfers Overall transfer level: Needs assistance Equipment used: None Transfers: Sit to/from Stand Sit to Stand: Supervision           General transfer comment: Supervision for safety. Stood from [name].    Ambulation/Gait Ambulation/Gait assistance: Supervision Gait Distance (Feet): 120  Feet Assistive device: None (occasional vitals machine kpush an duse of hallway rail) Gait Pattern/deviations: Step-through pattern, Decreased step length - left, Decreased step length - right, Wide base of support Gait velocity: decreased     General Gait Details: Slow, mostly steady gait holding onto rail for support, a few standing rest breaks. 3-4/4 DOE. Sp02 dropped to 85% on 4L/min 02 [name]. titrated up to 6 for walk back to room. Took a few mins to recover. Ended session on 3 L supplemental O2   Stairs             Wheelchair Mobility    Modified Rankin (Stroke Patients Only)       Balance Overall balance assessment: Mild deficits observed, not formally tested                                          Cognition Arousal/Alertness: Awake/alert Behavior During Therapy: WFL for tasks assessed/performed Overall Cognitive Status: Within Functional Limits for tasks assessed                                          Exercises      General Comments General comments (skin integrity, edema, etc.): O2sats dropped to 85% observed lowest -- quite an improvement from dropping to 77% last PT ession      Pertinent Vitals/Pain Pain Assessment Pain Assessment: No/denies pain  Home Living                          Prior Function            PT Goals (current goals can now be found in the care plan section) Acute Rehab PT Goals Patient Stated Goal: to be able to breathe and go home PT Goal Formulation: With patient Time For Goal Achievement: 06/10/22 Potential to Achieve Goals: Good Progress towards PT goals: Progressing toward goals    Frequency    Min 3X/week      PT Plan Discharge plan needs to be updated    Co-evaluation              AM-PAC PT "6 Clicks" Mobility   Outcome Measure  Help needed turning from your back to your side while in a flat bed without using bedrails?: None Help needed moving from  lying on your back to sitting on the side of a flat bed without using bedrails?: None Help needed moving to and from a bed to a chair (including a wheelchair)?: A Little Help needed standing up from a chair using your arms (e.g., wheelchair or bedside chair)?: A Little Help needed to walk in hospital room?: A Little Help needed climbing 3-5 steps with a railing? : A Little 6 Click Score: 20    End of Session Equipment Utilized During Treatment: Oxygen Activity Tolerance: Patient tolerated treatment well Patient left: in chair;with call bell/phone within reach Nurse Communication: Mobility status PT Visit Diagnosis: Muscle weakness (generalized) (M62.81);Difficulty in walking, not elsewhere classified (R26.2);Other (comment) (DOE)     Time: 3329-5188 PT Time Calculation (min) (ACUTE ONLY): 20 min  Charges:  $Gait Training: 8-22 mins                     Roney Marion, [place] Office [phone]    Colletta [place] 05/29/2022, 3:54 PM

## 2022-05-29 NOTE — Inpatient Diabetes Management (Signed)
Inpatient Diabetes Program Recommendations  AACE/ADA: New Consensus Statement on Inpatient Glycemic Control (2015)  Target Ranges:  Prepandial:   less than 140 mg/dL      Peak postprandial:   less than 180 mg/dL (1-2 hours)      Critically ill patients:  140 - 180 mg/dL   Lab Results  Component Value Date   GLUCAP 141 (H) 05/29/2022   HGBA1C 6.4 (A) 04/15/2022    Review of Glycemic Control   Latest Reference Range & Units 05/28/22 07:35 05/28/22 11:37 05/28/22 16:07 05/28/22 21:03 05/29/22 07:21  Glucose-Capillary 70 - 99 mg/dL 119 (H) 260 (H) 378 (H) 244 (H) 141 (H)   Diabetes history: DM 2 Outpatient Diabetes medications: Metformin 1000 mg bid (farxiga in the past) Current orders for Inpatient glycemic control:  Novolog 0-20 units tid + hs  PO Prednisone 40 mg Daily A1c 6.4% on 11/20  Inpatient Diabetes Program Recommendations:    Note: Glucose trends increase significantly after steroid dose and PO intake. If steroid dose remains the same consider the following...  -  Add Novolog 5 units tid meal coverage if eating >50% of meals  Thanks,  Tama Headings RN, MSN, BC-ADM Inpatient Diabetes Coordinator Team Pager [phone] (8a-5p)

## 2022-05-29 NOTE — Progress Notes (Signed)
NAME:  Rebecca Mcintyre, MRN:  [id number], DOB:  08/12/1945, LOS: 2 ADMISSION DATE:  05/25/2022, CONSULTATION DATE:  05/27/22 REFERRING MD:  Dr. Louanne Belton, CHIEF COMPLAINT:  SOB   History of Present Illness:   38 yoF with PMH significant for former smoker, chronic hypoxic and hypercarbic respiratory failure on 3-4L HOT and nocturnal NIV, HFpEF, cor pulmonale/ PH (resolved on most recent echo 01/2022), CAD, HTN, and obesity who was admitted to [facility] on 12/30 after failed out patient treatment of AECOPD.   Patient is followed in our clinic by Dr. [name].  Most recent visit tolerating nocturnal NIV well, maintained home breztri, and has referral to [facility] interventional pulmonology in June but was able to get an earlier appointment at [facility] for [facility] valve evaluation that's scheduled for 1/3.  She lives alone and independently, and does home pulmonary rehab.  States she never has issues with cough or sputum production but developed both with productive yellowish sputum at the beginning of December.  No issues with wheezing, or  LE swelling or pain.  She is very compliant with her daily lasix.  Some occasional acid reflux symptoms.  She was treated with azithromycin and steroid course 12/5 with minimal improvement, and then again treated 12/19 with augment and another steroid taper.  Reports ongoing cough and productive phlegm but now is clear.  No fever or chills.   She was admitted for further workup with negative respiratory viral panel, neg COVID, Flu, or RSV.  CXR showed increased perihilar and bibasilar interstitial markings, normal CBC w/diff since with development of leukocytosis, decreasing now, BNP 67, neg troponin hs, and slight AKI thefore her lasix was held.  Continued on steroids and inhalers without significant improvement therefore pulmonary consulted for further recommendations.  Remains on her baseline 3-4L [place].   Noted by PT to have exertional desaturations down to 77% on 4L [place] on ambulation today.  Lasix  resumed today given improving sCr.   Pertinent  Medical History  Former smoker, chronic hypoxic and hypercarbic respiratory failure on 3-4L [place] HOT, COPD, cor pulmonale, HFpEF, CAD, MI, HTN, HLD, DM, restless leg syndrome, gout, obesity   PFTs 12/29/2018>  FVC 2.31 (82), FEV1 1.20 (56), ratio 52, no bronchodilatory response, DLCO 51 %  Significant Hospital Events: Including procedures, antibiotic start and stop dates in addition to other pertinent events     Interim History / Subjective:  No acute distress at rest Objective   Blood pressure 127/60, pulse 73, temperature (!) 97.5 F (36.4 C), temperature source Oral, resp. rate 18, height _0  (1.6 m), weight 106.8 kg, SpO2 96 %.    FiO2 (%):  [28 %] 28 %   Intake/Output Summary (Last 24 hours) at 05/29/2022 0924 Last data filed at 05/29/2022 8676 Gross per 24 hour  Intake 480 ml  Output 2800 ml  Net -2320 ml   Filed Weights   05/25/22 1317 05/29/22 0500  Weight: 106.6 kg 106.8 kg   Examination: General: Morbid obese female no acute distress HEENT: MM pink/moist no JVD Neuro: Grossly intact CV: Heart sounds are regular PULM: Without wheeze Currently on O2 at 3 L which is her normal GI: soft, bsx4 active  GU: Voids Extremities: warm/dry, 1+ edema  Skin: no rashes or lesions    Intake/Output Summary (Last 24 hours) at 05/29/2022 0925 Last data filed at 05/29/2022 7209 Gross per 24 hour  Intake 480 ml  Output 2800 ml  Net -2320 ml    Resolved Hospital Problem list  Assessment & Plan:   AECOPD Chronic hypoxic and hypercarbic respiratory failure  Diuresis has been effective with the -2300 cc / 24 hours Continue to follow culture data when available CT of the chest was negative for PE Continue steroids Continue nebulized bronchodilators Continue O2 Keep scheduled appointment with Dr. [name] 06/04/2022 at 0940 hrs.       Remainder per primary team.  PCCM will continue to follow.    Best Practice (right click and  "Reselect all SmartList Selections" daily)  Per primary team.   Labs   CBC: Recent Labs  Lab 05/25/22 1230 05/26/22 0324 05/26/22 1707 05/27/22 0346 05/27/22 1416 05/28/22 0531  WBC 9.8 9.9 15.2* 12.3* 11.6* 11.7*  NEUTROABS 5.8  --   --   --   --   --   HGB 14.9 13.8 14.3 13.6 14.0 13.9  HCT 48.0* 41.0 43.0 41.0 43.1 41.4  MCV 95.4 90.9 90.7 91.5 91.3 90.6  PLT 193 177 213 176 220 875    Basic Metabolic Panel: Recent Labs  Lab 05/25/22 1230 05/26/22 0324 05/27/22 0346 05/28/22 0531 05/29/22 0429  NA 140 135 137 137 136  K 3.8 4.4 4.0 3.4* 3.7  CL 100 94* 99 95* 93*  CO2 _0 33*  GLUCOSE 139* 362* 208* 117* 161*  BUN 23 30* 34* 36* 36*  CREATININE 1.36* 1.52* 1.28* 1.21* 1.40*  CALCIUM 9.7 9.7 9.2 8.8* 8.9  MG  --   --   --  2.7* 2.8*   GFR: Estimated Creatinine Clearance: 40.7 mL/min (A) (by C-G formula based on SCr of 1.4 mg/dL (H)). Recent Labs  Lab 05/26/22 1707 05/27/22 0346 05/27/22 1416 05/27/22 1710 05/28/22 0531  PROCALCITON  --   --   --  <0.10 <0.10  WBC 15.2* 12.3* 11.6*  --  11.7*    Liver Function Tests: Recent Labs  Lab 05/25/22 1230  AST 30  ALT 28  ALKPHOS 75  BILITOT 0.6  PROT 7.4  ALBUMIN 3.8   No results for input(s): "LIPASE", "AMYLASE" in the last 168 hours. No results for input(s): "AMMONIA" in the last 168 hours.  ABG    Component Value Date/Time   PHART 7.47 (H) 02/06/2022 1130   PCO2ART 57 (H) 02/06/2022 1130   PO2ART 65 (L) 02/06/2022 1130   HCO3 41.5 (H) 02/06/2022 1130   TCO2 33 (H) 11/28/2020 1618   O2SAT 94.3 02/06/2022 1130     Coagulation Profile: No results for input(s): "INR", "PROTIME" in the last 168 hours.  Cardiac Enzymes: No results for input(s): "CKTOTAL", "CKMB", "CKMBINDEX", "TROPONINI" in the last 168 hours.  HbA1C: Hemoglobin A1C  Date/Time Value Ref Range Status  04/15/2022 12:18 PM 6.4 (A) 4.0 - 5.6 % Final   Hgb A1c MFr Bld  Date/Time Value Ref Range Status  04/10/2021  10:25 AM 7.5 (H) 4.6 - 6.5 % Final    Comment:    Glycemic Control Guidelines for People with Diabetes:Non Diabetic:  <6%Goal of Therapy: <7%Additional Action Suggested:  >8%   10/03/2020 09:42 AM 7.4 (H) 4.6 - 6.5 % Final    Comment:    Glycemic Control Guidelines for People with Diabetes:Non Diabetic:  <6%Goal of Therapy: <7%Additional Action Suggested:  >8%     CBG: Recent Labs  Lab 05/28/22 0735 05/28/22 1137 05/28/22 1607 05/28/22 2103 05/29/22 0721  GLUCAP 119* 260* 378* 244* [place] Halvor Behrend ACNP Acute Care Nurse Practitioner [place] Please consult Amion 05/29/2022,  9:24 AM

## 2022-05-30 DIAGNOSIS — J441 Chronic obstructive pulmonary disease with (acute) exacerbation: Secondary | ICD-10-CM | POA: Diagnosis not present

## 2022-05-30 DIAGNOSIS — J432 Centrilobular emphysema: Secondary | ICD-10-CM | POA: Diagnosis not present

## 2022-05-30 DIAGNOSIS — I5032 Chronic diastolic (congestive) heart failure: Secondary | ICD-10-CM | POA: Diagnosis not present

## 2022-05-30 DIAGNOSIS — J9621 Acute and chronic respiratory failure with hypoxia: Secondary | ICD-10-CM | POA: Diagnosis not present

## 2022-05-30 LAB — BASIC METABOLIC PANEL
Anion gap: 12 (ref 5–15)
BUN: 33 mg/dL — ABNORMAL HIGH (ref 8–23)
CO2: 32 mmol/L (ref 22–32)
Calcium: 9 mg/dL (ref 8.9–10.3)
Chloride: 90 mmol/L — ABNORMAL LOW (ref 98–111)
Creatinine, Ser: 1.35 mg/dL — ABNORMAL HIGH (ref 0.44–1.00)
GFR, Estimated: 41 mL/min — ABNORMAL LOW (ref >60)
Glucose, Bld: 130 mg/dL — ABNORMAL HIGH (ref 70–99)
Potassium: 3.2 mmol/L — ABNORMAL LOW (ref 3.5–5.1)
Sodium: 134 mmol/L — ABNORMAL LOW (ref 135–145)

## 2022-05-30 LAB — ACID FAST SMEAR (AFB, MYCOBACTERIA): Acid Fast Smear: NEGATIVE

## 2022-05-30 LAB — GLUCOSE, CAPILLARY: Glucose-Capillary: 124 mg/dL — ABNORMAL HIGH (ref 70–99)

## 2022-05-30 MED ORDER — METFORMIN HCL 1000 MG PO TABS: 1000.0000 mg | ORAL_TABLET | Freq: Two times a day (BID) | ORAL | 3 refills | Status: DC

## 2022-05-30 MED ORDER — PREDNISONE 10 MG PO TABS: ORAL_TABLET | ORAL | 0 refills | Status: DC

## 2022-05-30 MED ORDER — GUAIFENESIN ER 600 MG PO TB12: 1200.0000 mg | ORAL_TABLET | Freq: Two times a day (BID) | ORAL | 0 refills | Status: AC

## 2022-05-30 MED ORDER — ALBUTEROL SULFATE (2.5 MG/3ML) 0.083% IN NEBU: 2.5000 mg | INHALATION_SOLUTION | Freq: Four times a day (QID) | RESPIRATORY_TRACT | 11 refills | Status: AC | PRN

## 2022-05-30 MED ORDER — INSULIN ASPART 100 UNIT/ML IJ SOLN
4.0000 [IU] | Freq: Three times a day (TID) | INTRAMUSCULAR | Status: DC
  Administered 2022-05-30: 4 [IU] via SUBCUTANEOUS

## 2022-05-30 MED ORDER — BUDESONIDE 0.5 MG/2ML IN SUSP: 0.5000 mg | Freq: Two times a day (BID) | RESPIRATORY_TRACT | 12 refills | Status: DC

## 2022-05-30 MED ORDER — ARFORMOTEROL TARTRATE 15 MCG/2ML IN NEBU: 15.0000 ug | INHALATION_SOLUTION | Freq: Two times a day (BID) | RESPIRATORY_TRACT | 11 refills | Status: DC

## 2022-05-30 MED ORDER — METOPROLOL TARTRATE 25 MG PO TABS: 25.0000 mg | ORAL_TABLET | Freq: Every day | ORAL | Status: DC

## 2022-05-30 MED ORDER — PANTOPRAZOLE SODIUM 40 MG PO TBEC: 40.0000 mg | DELAYED_RELEASE_TABLET | Freq: Every day | ORAL | 3 refills | Status: DC

## 2022-05-30 MED ORDER — REVEFENACIN 175 MCG/3ML IN SOLN: 175.0000 ug | Freq: Every day | RESPIRATORY_TRACT | 6 refills | Status: DC

## 2022-05-30 NOTE — TOC Transition Note (Signed)
Transition of Care [facility]) - CM/SW Discharge Note   Patient Details  Name: Rebecca Mcintyre MRN: [id number] Date of Birth: 1946-04-13  Transition of Care [facility]) CM/SW Contact:  Tom-Johnson, Renea Ee, RN Phone Number: 05/30/2022, 11:52 AM   Clinical Narrative:     Patient is scheduled for discharge today. Home health info on AVS. Patient receives home O2 supplies from [employer]. Daughter to transport at discharge. No further TOC needs noted.       Final next level of care: Home w Home Health Services Barriers to Discharge: Barriers Resolved   Patient Goals and CMS Choice CMS Medicare.gov Compare Post Acute Care list provided to:: Patient Choice offered to / list presented to : Patient, Adult Children (Daughter, [name])  Discharge Placement                  Patient to be transferred to facility by: Daughter      Discharge Plan and Services Additional resources added to the After Visit Summary for                  DME Arranged: N/A DME Agency: NA       HH Arranged: PT HH Agency: [facility] Date [place] Agency Contacted: 05/30/22 Time [place]: 1134 Representative spoke with at [place]: [place] ([place]) Interventions [place]: No Food Insecurity (05/25/2022)  Housing: Low Risk  (05/25/2022)  Transportation Needs: No Transportation Needs (05/25/2022)  Utilities: Not At Risk (05/25/2022)  Alcohol Screen: Low Risk  (04/03/2022)  Depression (PHQ2-9): Low Risk  (04/15/2022)  Financial Resource Strain: Low Risk  (04/03/2022)  Physical Activity: Insufficiently Active (04/03/2022)  Social Connections: Socially Isolated (04/03/2022)  Stress: No Stress Concern Present (04/03/2022)  Tobacco Use: Medium Risk (05/25/2022)     Readmission Risk Interventions     No data to display

## 2022-05-30 NOTE — Discharge Summary (Signed)
Physician Discharge Summary   Patient: Rebecca Mcintyre MRN: [id number] DOB: 09-13-1945  Admit date:     05/25/2022  Discharge date: 05/30/22  Discharge Physician: Estill Cotta   PCP: Rigoberto Noel, MD   Recommendations at discharge:   Nebulizers including Maretta Bees, Pulmicort, Brovana ordered.  Breztri discontinued Outpatient follow-up with pulmonology on 1/9 scheduled  Discharge Diagnoses:    Acute on chronic respiratory failure with hypoxia ([place])   Acute COPD exacerbation  Acute kidney injury   Diabetes mellitus type 2, NIDDM, poorly controlled   Hyperlipidemia   GOUT   OBESITY   Essential hypertension   CAD (coronary artery disease) Chronic diastolic CHF (congestive heart failure) (HCC)   Restless leg syndrome   Hypokalemia  Hospital Course:  Rebecca Mcintyre is a 77 y.o. female with medical history significant of restless leg syndrome, obesity, CAD, hyperlipidemia, gout, COPD, hypertension, diabetes mellitus and congestive heart failure presented to the hospital with shortness of breath.  Patient was seen in the pulmonary clinic on 05/14/2022 for COPD exacerbation and was started on prednisone taper and Augmentin.  After completion of the treatment, she again worsened so she decided to come to the hospital.  She was needing 4 L of oxygen from her baseline 3 L.  She was mildly tachycardic in the ED with elevation of creatinine at 1.3 from baseline 1.0.   Respiratory panel for flu COVID and RSV negative.  Chest x-ray with increased interstitial markings representing atypical pneumonia versus edema.  Patient received Solu-Medrol and DuoNeb  in the ED and was considered for admission to the hospital for further evaluation and treatment.   During hospitalization, patient continued to have dyspnea on exertion with hypoxia on ambulation so pulmonary was consulted.    Assessment and Plan:  Acute on chronic respiratory failure with hypoxia COPD exacerbation -Known history of COPD with  recent treatment by pulmonology as outpatient with Augmentin, Zithromax, prednisone -At baseline on O2, 2 to 3 L.,  Initially required BiPAP at night - Respiratory virus panel negative.  RSV flu and COVID-negative.  Chest x-ray showed increased perihilar and bibasilar interstitial markings. -Pulmonology was consulted due to persistent hypoxia, worse on exertion. -D-dimer negative, HRCT showed no acute fibrotic changes. -Pulmonology recommended to continue Brovana, Yupelri and Pulmicort nebulizer on discharge and to discontinue Breztri.   -She has follow-up appointment on 06/04/2022 with pulmonology, Dr. [name]    AKI Creatinine improved to 1.3 upon discharge     Hypokalemia.  -Continue potassium replacement at home   Gout On allopurinol continue   CAD/Hyperlipidemia -No acute issues.  Continue home aspirin, metoprolol, rosuvastatin   Chronic diastolic heart failure CHF Last 2 D echo was in September, 2023 with EF 60-65%, due to DD, normal RV function.  -Stable, continue Lasix, metoprolol    Restless leg syndrome, Continue ropinirole   Diabetes mellitus type II, uncontrolled with hyperglycemia likely worsened due to steroids -Patient was placed on sliding scale insulin while inpatient. -Metformin increased to 1000 mg twice daily Hemoglobin A1c 6.4 on 04/15/2022  Hypertension -Continue metoprolol   Morbid obesity Body mass index is 41.71 kg/m.  Would benefit from weight loss as outpatient.      Pain control - [employer] Controlled Substance Reporting System database was reviewed. and patient was instructed, not to drive, operate heavy machinery, perform activities at heights, swimming or participation in water activities or provide baby-sitting services while on Pain, Sleep and Anxiety Medications; until their outpatient Physician has advised to do so again.  Also recommended to not to take more than prescribed Pain, Sleep and Anxiety Medications.  Consultants:  Pulmonology Procedures performed: None Disposition: Home Diet recommendation:  Discharge Diet Orders (From admission, onward)     Start     Ordered   05/30/22 0000  Diet - low sodium heart healthy        05/30/22 1047           Carb modified diet DISCHARGE MEDICATION: Allergies as of 05/30/2022       Reactions   Atorvastatin    REACTION: myalgias  and liver        Medication List     STOP taking these medications    amoxicillin-clavulanate 875-125 MG tablet Commonly known as: AUGMENTIN   azithromycin 250 MG tablet Commonly known as: ZITHROMAX   Breztri Aerosphere 160-9-4.8 MCG/ACT Aero Generic drug: Budeson-Glycopyrrol-Formoterol   dapagliflozin propanediol 10 MG Tabs tablet Commonly known as: FARXIGA   dapagliflozin propanediol 5 MG Tabs tablet Commonly known as: Farxiga   ketoconazole 2 % cream Commonly known as: NIZORAL       TAKE these medications    Accu-Chek Guide test strip Generic drug: glucose blood USE AS DIRECTED TWICE A DAY   Accu-Chek Guide w/Device Kit Use as directed twice a day Diagnosis E11.9   Accu-Chek Softclix Lancets lancets Use as instructed twice a day Diagnosis E11.9   acetaminophen 500 MG tablet Commonly known as: TYLENOL Take 1,000 mg by mouth every 6 (six) hours as needed for mild pain.   albuterol 108 (90 Base) MCG/ACT inhaler Commonly known as: VENTOLIN HFA USE 2 PUFFS EVERY 6 HOURS AS NEEDED FOR WHEEZE OR SHORTNESS OF BREATH What changed: See the new instructions.   albuterol (2.5 MG/3ML) 0.083% nebulizer solution Commonly known as: PROVENTIL Take 3 mLs (2.5 mg total) by nebulization every 6 (six) hours as needed for wheezing or shortness of breath. What changed: Another medication with the same name was changed. Make sure you understand how and when to take each.   allopurinol 100 MG tablet Commonly known as: ZYLOPRIM TAKE 1 TABLET BY MOUTH TWICE A DAY What changed:  how much to take when to take this    arformoterol 15 MCG/2ML Nebu Commonly known as: BROVANA Take 2 mLs (15 mcg total) by nebulization 2 (two) times daily.   aspirin 81 MG tablet Take 81 mg by mouth daily.   budesonide 0.5 MG/2ML nebulizer solution Commonly known as: PULMICORT Take 2 mLs (0.5 mg total) by nebulization 2 (two) times daily.   calcium carbonate 750 MG chewable tablet Commonly known as: TUMS EX Chew 750 mg by mouth daily as needed for heartburn.   furosemide 40 MG tablet Commonly known as: LASIX TAKE 1 TABLET BY MOUTH EVERY DAY   guaiFENesin 600 MG 12 hr tablet Commonly known as: MUCINEX Take 2 tablets (1,200 mg total) by mouth 2 (two) times daily.   Klor-Con M10 10 MEQ tablet Generic drug: potassium chloride TAKE 1 TABLET BY MOUTH TWICE A DAY What changed:  how much to take when to take this   metFORMIN 1000 MG tablet Commonly known as: GLUCOPHAGE Take 1 tablet (1,000 mg total) by mouth 2 (two) times daily with a meal. What changed:  medication strength See the new instructions.   metoprolol tartrate 25 MG tablet Commonly known as: LOPRESSOR Take 1 tablet (25 mg total) by mouth daily. TAKE 1/2 TABLETS BY MOUTH 2 TIMES DAILY What changed: See the new instructions.   nitroGLYCERIN 0.4 MG SL tablet   Commonly known as: NITROSTAT PLACE 1 TABLET UNDER THE TONGUE EVERY 5 MINUTES AS NEEDED. What changed: See the new instructions.   pantoprazole 40 MG tablet Commonly known as: PROTONIX Take 1 tablet (40 mg total) by mouth daily.   predniSONE 10 MG tablet Commonly known as: DELTASONE Prednisone dosing: Take  Prednisone 40mg (4 tabs) x 2 days, then taper to 30mg (3 tabs) x 3 days, then 20mg (2 tabs) x 3days, then 10mg (1 tab) x 3days, then OFF. What changed:  how much to take how to take this when to take this additional instructions Another medication with the same name was removed. Continue taking this medication, and follow the directions you see here.   revefenacin 175 MCG/3ML  nebulizer solution Commonly known as: YUPELRI Take 3 mLs (175 mcg total) by nebulization daily. Start taking on: May 31, 2022   rOPINIRole 0.5 MG tablet Commonly known as: REQUIP TAKE 1 TABLET AT BEDTIME FOR RESTLESS LEG What changed: See the new instructions.   rosuvastatin 20 MG tablet Commonly known as: CRESTOR Take 1 tablet (20 mg total) by mouth daily. What changed: when to take this   Spacer/Aero-Hold Chamber Bags Misc Please show patient how to use   VISINE DRY EYE RELIEF OP Place 1 drop into both eyes daily as needed (dry eyes).        Follow-up Information     Alva, Rakesh V, MD Follow up on 06/04/2022.   Specialty: Pulmonary Disease Why: at 9:45 AM Contact information: [address] [place] [address] [phone]         [facility] Follow up.   Specialty: Home Health Services Why: Someone will call you to schedule first home visit. If you have not received a call after two days of discharging home, call their number listed. If no one comes to assess, call Case Manager at [phone]. Contact information: [address] [place] [address] [phone]                Discharge Exam: Filed Weights   05/25/22 1317 05/29/22 0500 05/30/22 0500  Weight: 106.6 kg 106.8 kg 106.8 kg   S: Feeling a lot better and wants to go home.  No fevers or chills, no wheezing.  BP (!) 101/54   Pulse 79   Temp 97.8 F (36.6 C) (Oral)   Resp 16   Ht 5' 3" (1.6 m)   Wt 106.8 kg   SpO2 91%   BMI 41.71 kg/m   Physical Exam General: Alert and oriented x 3, NAD Cardiovascular: S1 S2 clear, RRR.  Respiratory: Diminished breath sound at the bases, no wheezing. Gastrointestinal: Soft, nontender, nondistended, NBS Ext: no pedal edema bilaterally Neuro: no new deficits Psych: Normal affect    Condition at discharge: fair  The results of significant diagnostics from this hospitalization (including  imaging, microbiology, ancillary and laboratory) are listed below for reference.   Imaging Studies: CT Chest High Resolution  Result Date: 05/28/2022 CLINICAL DATA:  Shortness of breath, unexplained hypoxia EXAM: CT CHEST WITHOUT CONTRAST TECHNIQUE: Multidetector CT imaging of the chest was performed following the standard protocol without intravenous contrast. High resolution imaging of the lungs, as well as inspiratory and expiratory imaging, was performed. RADIATION DOSE REDUCTION: This exam was performed according to the departmental dose-optimization program which includes automated exposure control, adjustment of the mA and/or kV according to patient size and/or use of iterative reconstruction technique. COMPARISON:  None Available.   FINDINGS: Cardiovascular: Aortic atherosclerosis. Normal heart size. Three-vessel coronary artery calcifications. Enlargement of the main pulmonary artery measuring up to 3.9 cm in caliber. No pericardial effusion. Mediastinum/Nodes: No enlarged mediastinal, hilar, or axillary lymph nodes. Thyroid gland, trachea, and esophagus demonstrate no significant findings. Lungs/Pleura: Severe emphysema. No evidence of fibrotic interstitial lung disease. Mild bland appearing dependent bibasilar scarring and or partial atelectasis. No significant air trapping on expiratory phase imaging. No pleural effusion or pneumothorax. Upper Abdomen: No acute abnormality. Coarse contour liver in the included upper abdomen. Musculoskeletal: No chest wall abnormality. No acute osseous findings. IMPRESSION: 1. No evidence of fibrotic interstitial lung disease. 2. Severe emphysema. 3. Enlargement of the main pulmonary artery, as can be seen in pulmonary hypertension. 4. Coronary artery disease. 5. Coarse contour liver in the included upper abdomen, suggestive of cirrhosis. Correlate with biochemical findings. Aortic Atherosclerosis (ICD10-I70.0) and Emphysema (ICD10-J43.9). Electronically Signed   By:  Alex D Bibbey M.D.   On: 05/28/2022 10:10   DG Chest Port 1 View  Result Date: 05/25/2022 CLINICAL DATA:  Shortness of breath EXAM: PORTABLE CHEST 1 VIEW COMPARISON:  01/14/2022 FINDINGS: Stable cardiomediastinal contours. Aortic atherosclerosis. Increased perihilar and bibasilar interstitial markings. No pleural effusion or pneumothorax. IMPRESSION: Increased perihilar and bibasilar interstitial markings, which may reflect edema or atypical/viral infection. Electronically Signed   By: Nicholas  Plundo D.O.   On: 05/25/2022 14:44    Microbiology: Results for orders placed or performed during the hospital encounter of 05/25/22  Resp panel by RT-PCR (RSV, Flu A&B, Covid) Anterior Nasal Swab     Status: None   Collection Time: 05/25/22  1:50 PM   Specimen: Anterior Nasal Swab  Result Value Ref Range Status   SARS Coronavirus 2 by RT PCR NEGATIVE NEGATIVE Final    Comment: (NOTE) SARS-CoV-2 target nucleic acids are NOT DETECTED.  The SARS-CoV-2 RNA is generally detectable in upper respiratory specimens during the acute phase of infection. The lowest concentration of SARS-CoV-2 viral copies this assay can detect is 138 copies/mL. A negative result does not preclude SARS-Cov-2 infection and should not be used as the sole basis for treatment or other patient management decisions. A negative result may occur with  improper specimen collection/handling, submission of specimen other than nasopharyngeal swab, presence of viral mutation(s) within the areas targeted by this assay, and inadequate number of viral copies(<138 copies/mL). A negative result must be combined with clinical observations, patient history, and epidemiological information. The expected result is Negative.  Fact Sheet for Patients:  https://www.fda.gov/media/152166/download  Fact Sheet for Healthcare Providers:  https://www.fda.gov/media/152162/download  This test is no t yet approved or cleared by the [place] FDA  and  has been authorized for detection and/or diagnosis of SARS-CoV-2 by FDA under an Emergency Use Authorization (EUA). This EUA will remain  in effect (meaning this test can be used) for the duration of the COVID-19 declaration under Section 564(b)(1) of the Act, 21 U.S.C.section 360bbb-3(b)(1), unless the authorization is terminated  or revoked sooner.       Influenza A by PCR NEGATIVE NEGATIVE Final   Influenza B by PCR NEGATIVE NEGATIVE Final    Comment: (NOTE) The Xpert Xpress SARS-CoV-2/FLU/RSV plus assay is intended as an aid in the diagnosis of influenza from Nasopharyngeal swab specimens and should not be used as a sole basis for treatment. Nasal washings and aspirates are unacceptable for Xpert Xpress SARS-CoV-2/FLU/RSV testing.  Fact Sheet for Patients: https://www.fda.gov/media/152166/download  Fact Sheet for Healthcare Providers: https://www.fda.gov/media/152162/download  This test is not yet approved   or cleared by the [place] FDA and has been authorized for detection and/or diagnosis of SARS-CoV-2 by FDA under an Emergency Use Authorization (EUA). This EUA will remain in effect (meaning this test can be used) for the duration of the COVID-19 declaration under Section 564(b)(1) of the Act, 21 U.S.C. section 360bbb-3(b)(1), unless the authorization is terminated or revoked.     Resp Syncytial Virus by PCR NEGATIVE NEGATIVE Final    Comment: (NOTE) Fact Sheet for Patients: https://www.fda.gov/media/152166/download  Fact Sheet for Healthcare Providers: https://www.fda.gov/media/152162/download  This test is not yet approved or cleared by the [place] FDA and has been authorized for detection and/or diagnosis of SARS-CoV-2 by FDA under an Emergency Use Authorization (EUA). This EUA will remain in effect (meaning this test can be used) for the duration of the COVID-19 declaration under Section 564(b)(1) of the Act, 21 U.S.C. section 360bbb-3(b)(1),  unless the authorization is terminated or revoked.  Performed at [facility], [address] [place], [place] [address]   Respiratory (~20 pathogens) panel by PCR     Status: None   Collection Time: 05/25/22  1:50 PM   Specimen: Nasopharyngeal Swab; Respiratory  Result Value Ref Range Status   Adenovirus NOT DETECTED NOT DETECTED Final   Coronavirus 229E NOT DETECTED NOT DETECTED Final    Comment: (NOTE) The Coronavirus on the Respiratory Panel, DOES NOT test for the novel  Coronavirus (2019 nCoV)    Coronavirus HKU1 NOT DETECTED NOT DETECTED Final   Coronavirus NL63 NOT DETECTED NOT DETECTED Final   Coronavirus OC43 NOT DETECTED NOT DETECTED Final   Metapneumovirus NOT DETECTED NOT DETECTED Final   Rhinovirus / Enterovirus NOT DETECTED NOT DETECTED Final   Influenza A NOT DETECTED NOT DETECTED Final   Influenza B NOT DETECTED NOT DETECTED Final   Parainfluenza Virus 1 NOT DETECTED NOT DETECTED Final   Parainfluenza Virus 2 NOT DETECTED NOT DETECTED Final   Parainfluenza Virus 3 NOT DETECTED NOT DETECTED Final   Parainfluenza Virus 4 NOT DETECTED NOT DETECTED Final   Respiratory Syncytial Virus NOT DETECTED NOT DETECTED Final   Bordetella pertussis NOT DETECTED NOT DETECTED Final   Bordetella Parapertussis NOT DETECTED NOT DETECTED Final   Chlamydophila pneumoniae NOT DETECTED NOT DETECTED Final   Mycoplasma pneumoniae NOT DETECTED NOT DETECTED Final    Comment: Performed at [facility], [address] [place], [place] [address]  Expectorated Sputum Assessment w Gram Stain, Rflx to Resp Cult     Status: None   Collection Time: 05/27/22  4:03 PM   Specimen: Expectorated Sputum  Result Value Ref Range Status   Specimen Description EXPECTORATED SPUTUM  Final   Special Requests NONE  Final   Sputum evaluation   Final    Sputum specimen not acceptable for testing.  Please recollect.    C/ D. RANKS, RN 05/28/21 1042 A. LAFRANCE Performed at [facility],  [address] [place], [place] [address]    Report Status 05/29/2022 FINAL  Final  Acid Fast Smear (AFB)     Status: None   Collection Time: 05/27/22  4:08 PM   Specimen: Sputum  Result Value Ref Range Status   AFB Specimen Processing Concentration  Final   Acid Fast Smear Negative  Final    Comment: (NOTE) Performed At: [facility] [address] [place], [place] [id number] Nagendra Sanjai MD Ph:[phone]    Source (AFB) EXPECTORATED SPUTUM  Final    Comment: Performed at [facility],   [address] [place], [place] [address]    Labs: CBC: Recent Labs  Lab 05/25/22 1230 05/26/22 0324 05/26/22 1707 05/27/22 0346 05/27/22 1416 05/28/22 0531  WBC 9.8 9.9 15.2* 12.3* 11.6* 11.7*  NEUTROABS 5.8  --   --   --   --   --   HGB 14.9 13.8 14.3 13.6 14.0 13.9  HCT 48.0* 41.0 43.0 41.0 43.1 41.4  MCV 95.4 90.9 90.7 91.5 91.3 90.6  PLT 193 177 213 176 220 169   Basic Metabolic Panel: Recent Labs  Lab 05/26/22 0324 05/27/22 0346 05/28/22 0531 05/29/22 0429 05/29/22 1729 05/30/22 0706  NA 135 137 137 136  --  134*  K 4.4 4.0 3.4* 3.7  --  3.2*  CL 94* 99 95* 93*  --  90*  CO2 28 31 30 33*  --  32  GLUCOSE 362* 208* 117* 161* 430* 130*  BUN 30* 34* 36* 36*  --  33*  CREATININE 1.52* 1.28* 1.21* 1.40*  --  1.35*  CALCIUM 9.7 9.2 8.8* 8.9  --  9.0  MG  --   --  2.7* 2.8*  --   --    Liver Function Tests: Recent Labs  Lab 05/25/22 1230  AST 30  ALT 28  ALKPHOS 75  BILITOT 0.6  PROT 7.4  ALBUMIN 3.8   CBG: Recent Labs  Lab 05/29/22 0721 05/29/22 1134 05/29/22 1649 05/29/22 2045 05/30/22 0714  GLUCAP 141* 210* 410* 313* 124*    Discharge time spent: greater than 30 minutes.  Signed: Ripudeep Rai, MD [facility] 05/30/2022 

## 2022-05-30 NOTE — Care Management Important Message (Signed)
Important Message  Patient Details  Name: Rebecca Mcintyre MRN: [id number] Date of Birth: 1946/03/17   Medicare Important Message Given:  Yes Patient left prior to IM delivery will mail copy to the patient home address.     Jennel Mara 05/30/2022, 2:24 PM

## 2022-05-31 ENCOUNTER — Telehealth: Payer: Self-pay

## 2022-05-31 DIAGNOSIS — H6121 Impacted cerumen, right ear: Secondary | ICD-10-CM | POA: Diagnosis not present

## 2022-05-31 NOTE — Patient Outreach (Signed)
  Care Coordination TOC Note Transition Care Management Follow-up Telephone Call Date of discharge and from where: 05/30/22-[facility]  Dx: "COPD" How have you been since you were released from the hospital? Incoming call form daughter returning RN CM call. Patient with daughter in the car leaving ear appt. Patient voices "doing okay." SOB has improved and gotten somewhat better.  Any questions or concerns? Yes-Daughter and patient wanting to know why patient was taken off [place]. RN CM unable to locate reason in limited view of notes from inpatient stay. Daughter states she already plans to follow up with MD and Pharmacist at PCP office and will contact them to discuss as she feels like patient needs to be on it with taking steroids. Blood sugar this am was 141.  Items Reviewed: Did the pt receive and understand the discharge instructions provided? Yes  Medications obtained and verified?  Daughter and patient currently in car leaving ear doctor appt Other? Yes  Any new allergies since your discharge? No  Dietary orders reviewed? Yes Do you have support at home? Yes -daughter  Home Care and Equipment/Supplies: Were home health services ordered? yes If so, what is the name of the agency? [facility]  Has the agency set up a time to come to the patient's home? Yes-Daughter states he got a call from an non 336 number stating that they were [facility] agency. Daughter was concerned because number ws not lcoal an d wondering if they re legit. Discussed that they may be calling from non 336 area code number but to call back. Daughter will follow up. Were any new equipment or medical supplies ordered?  No What is the name of the medical supply agency? N/A Were you able to get the supplies/equipment? not applicable Do you have any questions related to the use of the equipment or supplies? No  Functional Questionnaire: (I = Independent and D = Dependent) ADLs: A  Bathing/Dressing- A  Meal Prep-  A  Eating- I  Maintaining continence- I  Transferring/Ambulation- I  Managing Meds- A  Follow up appointments reviewed:  PCP Hospital f/u appt confirmed? No  . [facility] f/u appt confirmed? Yes  Scheduled to see Dr. [name] on 06/04/22 @ 9:45am and Dr. Percival Spanish on 06/04/22-1:20pm Are transportation arrangements needed? No  If their condition worsens, is the pt aware to call PCP or go to the Emergency Dept.? Yes Was the patient provided with contact information for the PCP's office or ED? Yes Was to pt encouraged to call back with questions or concerns? Yes  SDOH assessments and interventions completed:   Yes SDOH Interventions Today    Flowsheet Row Most Recent Value  SDOH Interventions   Food Insecurity Interventions Intervention Not Indicated  Transportation Interventions Intervention Not Indicated       Care Coordination Interventions:  Education provided    Encounter Outcome:  Pt. Visit Completed    Enzo Montgomery, RN,BSN,CCM [facility] Management Telephonic Care Management Coordinator Direct Phone: [phone] Toll Free: [phone] Fax: [phone]

## 2022-05-31 NOTE — Patient Outreach (Signed)
  Care Coordination TOC Note Transition Care Management Unsuccessful Follow-up Telephone Call  Date of discharge and from where:  05/30/22-[facility]   Attempts:  2nd Attempt  Reason for unsuccessful TCM follow-up call:  RN CM received voicemail message from daughter-[name]. Return call placed.Left voice message.     Enzo Montgomery, RN,BSN,CCM [facility] Telephonic Care Management Coordinator Direct Phone: [phone] Toll Free: [phone] Fax: [phone]

## 2022-05-31 NOTE — Patient Outreach (Signed)
  Care Coordination TOC Note Transition Care Management Unsuccessful Follow-up Telephone Call  Date of discharge and from where:  05/30/22-[facility]  Attempts:  1st Attempt  Reason for unsuccessful TCM follow-up call:  Left voice message    [facility] Telephonic Care Management Coordinator Direct Phone: [phone] Toll Free: [phone] Fax: [phone]

## 2022-06-01 NOTE — Progress Notes (Unsigned)
Cardiology Office Note   Date:  06/04/2022   ID:  Rebecca Mcintyre 23-Sep-1945, MRN [id number]  PCP:  Rigoberto Noel, MD  Cardiologist:   Minus Breeding, MD   Chief Complaint  Patient presents with   Coronary Artery Disease     History of Present Illness: Rebecca Mcintyre is a 77 y.o. female who presents for evaluation of CAD. I saw her in 2014.  She has a history of an anterior MI in 2002.   She had DES to the LAD.    She was in the hospital after syncopal episode in June 2020.    She had acute hypoxic respiratory failure.  She had acute on chronic diastolic dysfunction.  She had acute cor pulmonale.  She did require some diuresis.  She had an echocardiogram which demonstrated well-preserved left ventricular function with some suggestion of diastolic dysfunction.  There was elevated pulmonary pressures noted.  She had moderate RV dysfunction.  She did have diuresis of 10.2 L.  She was sent home on 40 mg of Lasix.  She was also sent home on home O2 and pulmonary meds as listed.  She did have a follow-up echo.  Her pulmonary pressures seem to be reduced and RV function improved.  In July 2022 she was hospitalized with acute on chronic respiratory failure with acute on chronic diastolic HF.  She had acute encephalopathy thought to be related to narcotics.  During that hospitalization she had heart failure with preserved ejection fraction require diuresis.  She had an elevated troponin which was felt to be demand ischemia.  Echo demonstrated no new wall motion abnormalities.   She returns for follow up.  She was in the hospital in early January with exacerbation of her chronic lung disease.  She saw her pulmonologist a little bit ago.  She has had adjustments to her pulmonary meds and is on a steroid taper.  She is actually going to [facility] to be evaluated for Zephyr valve.  She is not using BiPAP.  She thinks does feel better with BiPAP.  It makes her less fatigued.  She is having less daytime  somnolence although she still is easily fatigable it is not as bad.  She is not having any new cough fevers or chills.  She is not having any new chest pressure, neck or arm discomfort.  I reviewed hospital records for this visit.  There were no acute cardiac issues.  Past Medical History:  Diagnosis Date   Acute on chronic respiratory failure with hypoxia and hypercapnia (HCC) 11/05/2018   Allergy    ASCUS on Pap smear    had repeat x 2 normal with [name] Plan 2011   Coronary artery disease    Gout    Hyperlipidemia    Hypertension    Myocardial infarction [facility])    2002 95% proximal LAD stenosis with thrombus followed by 90% stenosis, the first diagonal 80% stenosis, circumflex 30% stenosis, circumflex obtuse marginal subbranch 70% stenosis, PDA 40% stenosis. She had a drug-eluting stent placement with a Pixel stent   Osteopenia    Solitary kidney     Past Surgical History:  Procedure Laterality Date   CARDIAC CATHETERIZATION     CORONARY ANGIOPLASTY     CORONARY ANGIOPLASTY WITH STENT PLACEMENT  2002   HEMORRHOID SURGERY  2011   LAD Stent angioplasty  2002   Right oophorectomy with salpingectomy     benign growth Dr. Joneen Caraway     Current Outpatient  Medications  Medication Sig Dispense Refill   ACCU-CHEK GUIDE test strip USE AS DIRECTED TWICE A DAY 100 strip 3   Accu-Chek Softclix Lancets lancets Use as instructed twice a day Diagnosis E11.9 100 each 3   acetaminophen (TYLENOL) 500 MG tablet Take 1,000 mg by mouth every 6 (six) hours as needed for mild pain.     albuterol (PROVENTIL) (2.5 MG/3ML) 0.083% nebulizer solution Take 3 mLs (2.5 mg total) by nebulization every 6 (six) hours as needed for wheezing or shortness of breath. 300 mL 11   albuterol (VENTOLIN HFA) 108 (90 Base) MCG/ACT inhaler USE 2 PUFFS EVERY 6 HOURS AS NEEDED FOR WHEEZE OR SHORTNESS OF BREATH (Patient taking differently: Inhale 2 puffs into the lungs every 6 (six) hours as needed for wheezing or shortness  of breath.) 8.5 each 5   allopurinol (ZYLOPRIM) 100 MG tablet TAKE 1 TABLET BY MOUTH TWICE A DAY (Patient taking differently: Take 200 mg by mouth at bedtime.) 180 tablet 1   arformoterol (BROVANA) 15 MCG/2ML NEBU Take 2 mLs (15 mcg total) by nebulization 2 (two) times daily. 120 mL 11   aspirin 81 MG tablet Take 81 mg by mouth daily.     Blood Glucose Monitoring Suppl (ACCU-CHEK GUIDE) w/Device KIT Use as directed twice a day Diagnosis E11.9 1 kit 2   budesonide (PULMICORT) 0.5 MG/2ML nebulizer solution Take 2 mLs (0.5 mg total) by nebulization 2 (two) times daily. 120 mL 12   calcium carbonate (TUMS EX) 750 MG chewable tablet Chew 750 mg by mouth daily as needed for heartburn.     dapagliflozin propanediol (FARXIGA) 10 MG TABS tablet Take 10 mg by mouth daily.     furosemide (LASIX) 40 MG tablet TAKE 1 TABLET BY MOUTH EVERY DAY (Patient taking differently: Take 40 mg by mouth daily.) 90 tablet 0   guaiFENesin (MUCINEX) 600 MG 12 hr tablet Take 2 tablets (1,200 mg total) by mouth 2 (two) times daily. 60 tablet 0   metFORMIN (GLUCOPHAGE) 1000 MG tablet Take 1 tablet (1,000 mg total) by mouth 2 (two) times daily with a meal. 60 tablet 3   metoprolol tartrate (LOPRESSOR) 25 MG tablet Take 1 tablet (25 mg total) by mouth daily. TAKE 1/2 TABLETS BY MOUTH 2 TIMES DAILY     nitroGLYCERIN (NITROSTAT) 0.4 MG SL tablet PLACE 1 TABLET UNDER THE TONGUE EVERY 5 MINUTES AS NEEDED. (Patient taking differently: Place 0.4 mg under the tongue every 5 (five) minutes as needed for chest pain.) 25 tablet 1   pantoprazole (PROTONIX) 40 MG tablet Take 1 tablet (40 mg total) by mouth daily. 30 tablet 3   Polyethylene Glycol 400 (VISINE DRY EYE RELIEF OP) Place 1 drop into both eyes daily as needed (dry eyes).     potassium chloride (KLOR-CON M10) 10 MEQ tablet TAKE 1 TABLET BY MOUTH TWICE A DAY (Patient taking differently: Take 20 mEq by mouth at bedtime.) 180 tablet 0   predniSONE (DELTASONE) 10 MG tablet Prednisone  dosing: Take  Prednisone 62m (4 tabs) x 2 days, then taper to 398m(3 tabs) x 3 days, then 2029m2 tabs) x 3days, then 53m73m tab) x 3days, then OFF. 26 tablet 0   revefenacin (YUPELRI) 175 MCG/3ML nebulizer solution Take 3 mLs (175 mcg total) by nebulization daily. 90 mL 6   rOPINIRole (REQUIP) 0.5 MG tablet TAKE 1 TABLET AT BEDTIME FOR RESTLESS LEG (Patient taking differently: Take 0.5 mg by mouth at bedtime. TAKE 1 TABLET AT BEDTIME FOR RESTLESS  LEG) 90 tablet 0   rosuvastatin (CRESTOR) 20 MG tablet Take 1 tablet (20 mg total) by mouth daily. (Patient taking differently: Take 20 mg by mouth at bedtime.) 90 tablet 3   Spacer/Aero-Hold Chamber Bags MISC Please show patient how to use 1 each 0   No current facility-administered medications for this visit.    Allergies:   Atorvastatin    ROS:  Please see the history of present illness.   Otherwise, review of systems are positive for none.   All other systems are reviewed and negative.    PHYSICAL EXAM: VS:  BP 126/62   Pulse 82   Ht _0  (1.6 m)   Wt 232 lb (105.2 kg)   SpO2 95%   BMI 41.10 kg/m  , BMI Body mass index is 41.1 kg/m. GEN:  No distress NECK:  No jugular venous distention at 90 degrees, waveform within normal limits, carotid upstroke brisk and symmetric, no bruits, no thyromegaly LYMPHATICS:  No cervical adenopathy LUNGS:  Clear to auscultation bilaterally BACK:  No CVA tenderness CHEST:  Unremarkable HEART:  S1 and S2 within normal limits, no S3, no S4, no clicks, no rubs, no murmurs ABD:  Positive bowel sounds normal in frequency in pitch, no bruits, no rebound, no guarding, unable to assess midline mass or bruit with the patient seated. EXT:  2 plus pulses throughout, mild bilateral leg edema, no cyanosis no clubbing SKIN:  No rashes no nodules   EKG:  EKG is not ordered today.  Recent Labs: 01/31/2022: TSH 1.630 05/25/2022: ALT 28; B Natriuretic Peptide 67.5 05/28/2022: Hemoglobin 13.9; Platelets 169 05/29/2022:  Magnesium 2.8 05/30/2022: BUN 33; Creatinine, Ser 1.35; Potassium 3.2; Sodium 134    Lipid Panel    Component Value Date/Time   CHOL 167 01/29/2022 0914   TRIG 139 01/29/2022 0914   HDL 43 01/29/2022 0914   CHOLHDL 3.9 01/29/2022 0914   CHOLHDL 4 04/10/2021 1025   VLDL 31.6 04/10/2021 1025   LDLCALC 99 01/29/2022 0914   LDLDIRECT 116.0 02/05/2019 1105      Wt Readings from Last 3 Encounters:  06/04/22 232 lb (105.2 kg)  06/04/22 232 lb (105.2 kg)  05/30/22 235 lb 7.2 oz (106.8 kg)      Other studies Reviewed: Additional studies/ records that were reviewed today include: Hospital records Review of the above records demonstrates:  Please see elsewhere in the note.     ASSESSMENT AND PLAN:  CAD:   The patient has no new sypmtoms.  No further cardiovascular testing is indicated.  We will continue with aggressive risk reduction and meds as listed.  DYSLIPIDEMIA:     LDL was not at target and I had increased her statin.  She actually had labs drawn this morning fasting and I will wait for these results.   HTN:  The blood pressure is at target.  No change in therapy.  DIABETES:  Her hemoglobin A1c was 6.4.  No change in therapy.     CHRONIC DIASTOLIC DYSFUNCTION:   She did not have volume overload during her recent hospitalization.  She has a little lower extremity swelling but otherwise seems to be euvolemic.  No change in therapy.  Current medicines are reviewed at length with the patient today.  The patient does not have concerns regarding medicines.  The following changes have been made: None  Labs/ tests ordered today include:  None  No orders of the defined types were placed in this encounter.   Disposition:  FU with me in 6 months.   Signed, Minus Breeding, MD  06/04/2022 1:46 PM    [facility]

## 2022-06-02 ENCOUNTER — Encounter: Payer: Self-pay | Admitting: Cardiology

## 2022-06-03 DIAGNOSIS — Z7982 Long term (current) use of aspirin: Secondary | ICD-10-CM | POA: Diagnosis not present

## 2022-06-03 DIAGNOSIS — J441 Chronic obstructive pulmonary disease with (acute) exacerbation: Secondary | ICD-10-CM | POA: Diagnosis not present

## 2022-06-03 DIAGNOSIS — E785 Hyperlipidemia, unspecified: Secondary | ICD-10-CM | POA: Diagnosis not present

## 2022-06-03 DIAGNOSIS — I252 Old myocardial infarction: Secondary | ICD-10-CM | POA: Diagnosis not present

## 2022-06-03 DIAGNOSIS — I2510 Atherosclerotic heart disease of native coronary artery without angina pectoris: Secondary | ICD-10-CM | POA: Diagnosis not present

## 2022-06-03 DIAGNOSIS — G2581 Restless legs syndrome: Secondary | ICD-10-CM | POA: Diagnosis not present

## 2022-06-03 DIAGNOSIS — I110 Hypertensive heart disease with heart failure: Secondary | ICD-10-CM | POA: Diagnosis not present

## 2022-06-03 DIAGNOSIS — Z7984 Long term (current) use of oral hypoglycemic drugs: Secondary | ICD-10-CM | POA: Diagnosis not present

## 2022-06-03 DIAGNOSIS — Z7952 Long term (current) use of systemic steroids: Secondary | ICD-10-CM | POA: Diagnosis not present

## 2022-06-03 DIAGNOSIS — E119 Type 2 diabetes mellitus without complications: Secondary | ICD-10-CM | POA: Diagnosis not present

## 2022-06-03 DIAGNOSIS — I509 Heart failure, unspecified: Secondary | ICD-10-CM | POA: Diagnosis not present

## 2022-06-03 DIAGNOSIS — Z9981 Dependence on supplemental oxygen: Secondary | ICD-10-CM | POA: Diagnosis not present

## 2022-06-03 DIAGNOSIS — Z9181 History of falling: Secondary | ICD-10-CM | POA: Diagnosis not present

## 2022-06-04 ENCOUNTER — Ambulatory Visit (INDEPENDENT_AMBULATORY_CARE_PROVIDER_SITE_OTHER): Payer: Medicare Other | Admitting: Pulmonary Disease

## 2022-06-04 ENCOUNTER — Encounter (HOSPITAL_BASED_OUTPATIENT_CLINIC_OR_DEPARTMENT_OTHER): Payer: Self-pay | Admitting: Pulmonary Disease

## 2022-06-04 ENCOUNTER — Encounter: Payer: Self-pay | Admitting: Cardiology

## 2022-06-04 ENCOUNTER — Other Ambulatory Visit (HOSPITAL_BASED_OUTPATIENT_CLINIC_OR_DEPARTMENT_OTHER): Payer: Self-pay

## 2022-06-04 ENCOUNTER — Ambulatory Visit: Payer: Medicare Other | Attending: Cardiology | Admitting: Cardiology

## 2022-06-04 VITALS — BP 126/62 | HR 82 | Ht 63.0 in | Wt 232.0 lb

## 2022-06-04 VITALS — BP 126/68 | HR 90 | Temp 98.1°F | Ht 63.0 in | Wt 232.0 lb

## 2022-06-04 DIAGNOSIS — J9611 Chronic respiratory failure with hypoxia: Secondary | ICD-10-CM

## 2022-06-04 DIAGNOSIS — I5032 Chronic diastolic (congestive) heart failure: Secondary | ICD-10-CM | POA: Diagnosis not present

## 2022-06-04 DIAGNOSIS — I10 Essential (primary) hypertension: Secondary | ICD-10-CM | POA: Diagnosis not present

## 2022-06-04 DIAGNOSIS — I2510 Atherosclerotic heart disease of native coronary artery without angina pectoris: Secondary | ICD-10-CM

## 2022-06-04 DIAGNOSIS — E118 Type 2 diabetes mellitus with unspecified complications: Secondary | ICD-10-CM | POA: Diagnosis not present

## 2022-06-04 DIAGNOSIS — J432 Centrilobular emphysema: Secondary | ICD-10-CM

## 2022-06-04 DIAGNOSIS — E785 Hyperlipidemia, unspecified: Secondary | ICD-10-CM | POA: Diagnosis not present

## 2022-06-04 MED ORDER — REVEFENACIN 175 MCG/3ML IN SOLN: 175.0000 ug | Freq: Every day | RESPIRATORY_TRACT | 6 refills | Status: DC

## 2022-06-04 NOTE — Assessment & Plan Note (Addendum)
Clarified nebulizer medications, budesonide plus Brovana can be taken together twice daily. Yupelri prescription will be sent to specialty pharmacy, this is to be taken once daily. Use albuterol for rescue.  She will continue prednisone taper for recent COPD exacerbation, expect sugars to improve as prednisone is tapered off  She has been referred to interventional pulmonology at [facility] for evaluation for Zephyr valve.  She recently had high-resolution CT chest done at the hospital and that should suffice

## 2022-06-04 NOTE — Assessment & Plan Note (Addendum)
Continue 3 L of oxygen at all times Continue BiPAP during sleep for hypercarbia

## 2022-06-04 NOTE — Patient Instructions (Signed)
Medication Instructions:  Your physician recommends that you continue on your current medications as directed. Please refer to the Current Medication list given to you today.  *If you need a refill on your cardiac medications before your next appointment, please call your pharmacy*   Lab Work: NONE ordered at this time of appointment   If you have labs (blood work) drawn today and your tests are completely normal, you will receive your results only by: [place] (if you have MyChart) OR A paper copy in the mail If you have any lab test that is abnormal or we need to change your treatment, we will call you to review the results.   Testing/Procedures: NONE ordered at this time of appointment   Follow-Up: At [facility], you and your health needs are our priority.  As part of our continuing mission to provide you with exceptional heart care, we have created designated Provider Care Teams.  These Care Teams include your primary Cardiologist (physician) and Advanced Practice Providers (APPs -  Physician Assistants and Nurse Practitioners) who all work together to provide you with the care you need, when you need it.   Your next appointment:   6 month(s)  The format for your next appointment:   In Person  Provider:   Minus Breeding, MD     Other Instructions  Important Information About Sugar

## 2022-06-04 NOTE — Patient Instructions (Addendum)
  CLARIFICATION : Budesonide + brovana (Arformoterol ) twice daily - can be mixed  Yupelri once daily  Albuterol only as needed for rescue  X send Rx yupelri to specialty pharmacy

## 2022-06-04 NOTE — Progress Notes (Signed)
Subjective:    Patient ID: Rebecca Mcintyre, female    DOB: Jan 09, 1946, 77 y.o.   MRN: [id number]  HPI  77 yo ex-smoker for follow-up of COPD , chronic hypoxic respiratory failure and pulmonary hypertension Identical twin of Rebecca Mcintyre ,my patient who passed away   PMH - CAD, diastolic HF,  dyslipidemia, obesity  - hospitalized 10/2018 for acute on chronic hypercarbic respiratory failure -slow to resolve COPD exacerbation 01/2020 - hospitalized 11/2020  for right lower extremity injury, course complicated by acute diastolic heart failure and encephalopathy due to narcotics elevated troponin    Last OV 12/2021 >>refer her to interventional pulmonology for Zephyr valve evaluation , she has appointment at [facility] for January referral to [employer] rep for evaluation of POC -this has not happened yet ABG >> NIV , she was started on BiPAP and eventually after a lot of back-and-forth to DME   Chief Complaint  Patient presents with   Follow-up    Pt states that she was in the hospital for 5 days and her breathing did not improve too much. Still very SOB.    Her shortness of breath has not improved after her prescription from dr [name] and then a sick visit to dr Loanne Drilling on 12/19 and another round of augmentin & steroids. We went to the er on 12/30 & was admitted until 1/4 . She needed 4 L of oxygen from her baseline 3 L.  She was mildly tachycardic in the ED with elevation of creatinine at 1.3 from baseline 1.0.   Respiratory panel for flu COVID and RSV negative.  Chest x-ray with increased interstitial markings representing atypical pneumonia versus edema.  Pulmonology was consulted due to persistent hypoxia, worse on exertion. -D-dimer negative, HRCT showed no acute fibrotic changes. -Pulmonology recommended to continue Brovana, Yupelri and Pulmicort nebulizer on discharge and to discontinue Breztri.    She arrives in a wheelchair, on oxygen, accompanied by her daughter who corroborates history.  She is  confused about nebulizer medications that she was discharged on, she is down to 30 mg of prednisone.  Breathing is still poor She is back on her home BiPAP machine and loves this and is sleeping much better.  Several questions today that need clarification. Reviewed sputum testing from the hospital, AFB negative  Weight is at baseline 232 pounds  Significant tests/ events reviewed  ABG 01/2022 7.4 7/57/65 on 3 L oxygen ABG 02/2020 7.4 2/44/53/87%   PFTs 12/29/2018 FVC 2.31 (82), FEV1 1.20 (56), ratio 52, no bronchodilatory response, DLCO 51 %   10/2018 CTA Chest- No PE; cardiomegaly, emphysema       NPSG 12/13/18  no obstructive sleep apnea, min O2 89%. Periodic limb movements did occur during sleep  Review of Systems neg for any significant sore throat, dysphagia, itching, sneezing, nasal congestion or excess/ purulent secretions, fever, chills, sweats, unintended wt loss, pleuritic or exertional cp, hempoptysis, orthopnea pnd or change in chronic leg swelling. Also denies presyncope, palpitations, heartburn, abdominal pain, nausea, vomiting, diarrhea or change in bowel or urinary habits, dysuria,hematuria, rash, arthralgias, visual complaints, headache, numbness weakness or ataxia.     Objective:   Physical Exam  Gen. Pleasant, obese, in no distress, in WC on oxygen ENT - no lesions, no post nasal drip Neck: No JVD, no thyromegaly, no carotid bruits Lungs: no use of accessory muscles, no dullness to percussion, decreased without rales or rhonchi  Cardiovascular: Rhythm regular, heart sounds  normal, no murmurs or gallops, no peripheral edema  Musculoskeletal: No deformities, no cyanosis or clubbing , no tremors       Assessment & Plan:

## 2022-06-05 LAB — LIPID PANEL
Chol/HDL Ratio: 2.9 ratio (ref 0.0–4.4)
Cholesterol, Total: 172 mg/dL (ref 100–199)
HDL: 59 mg/dL (ref >39)
LDL Chol Calc (NIH): 76 mg/dL (ref 0–99)
Triglycerides: 229 mg/dL — ABNORMAL HIGH (ref 0–149)
VLDL Cholesterol Cal: 37 mg/dL (ref 5–40)

## 2022-06-10 ENCOUNTER — Encounter (HOSPITAL_BASED_OUTPATIENT_CLINIC_OR_DEPARTMENT_OTHER): Payer: Self-pay | Admitting: Pulmonary Disease

## 2022-06-10 NOTE — Telephone Encounter (Signed)
Mychart message sent by pt: Rebecca Mcintyre  P [facility] Clinical Pool (supporting Rigoberto Noel, MD)3 hours ago (10:49 AM)    This past Saturday afternoon my oxygen level started dropping into the 70's. I increased the oxygen and got it up to the mid 80's. The same thing all day Sunday and I was sleepy.  Today I'm using 4.5 and my highest oxygen level is 88. When I walk and move around it drops back into the 70's. Nothing hurts, no signs of a cold and my congestion has greatly improved. I'm using the nebulizer two times a day. What should I do?    Beth, please advise on this for pt.

## 2022-06-10 NOTE — Telephone Encounter (Signed)
Does she have any increased shortness of breath? Ensure reading is accurate, would check O2 level on someone else who does not have hx lung issue and make sure they are getting normal readings.   If symptomatic and reading is accurate needs ED evaluation as this is a change and concerning

## 2022-06-12 ENCOUNTER — Telehealth: Payer: Self-pay | Admitting: Internal Medicine

## 2022-06-12 DIAGNOSIS — Z9981 Dependence on supplemental oxygen: Secondary | ICD-10-CM | POA: Diagnosis not present

## 2022-06-12 DIAGNOSIS — I252 Old myocardial infarction: Secondary | ICD-10-CM | POA: Diagnosis not present

## 2022-06-12 DIAGNOSIS — Z7984 Long term (current) use of oral hypoglycemic drugs: Secondary | ICD-10-CM | POA: Diagnosis not present

## 2022-06-12 DIAGNOSIS — J441 Chronic obstructive pulmonary disease with (acute) exacerbation: Secondary | ICD-10-CM | POA: Diagnosis not present

## 2022-06-12 DIAGNOSIS — Z7952 Long term (current) use of systemic steroids: Secondary | ICD-10-CM | POA: Diagnosis not present

## 2022-06-12 DIAGNOSIS — E119 Type 2 diabetes mellitus without complications: Secondary | ICD-10-CM | POA: Diagnosis not present

## 2022-06-12 DIAGNOSIS — Z9181 History of falling: Secondary | ICD-10-CM | POA: Diagnosis not present

## 2022-06-12 DIAGNOSIS — I110 Hypertensive heart disease with heart failure: Secondary | ICD-10-CM | POA: Diagnosis not present

## 2022-06-12 DIAGNOSIS — I509 Heart failure, unspecified: Secondary | ICD-10-CM | POA: Diagnosis not present

## 2022-06-12 DIAGNOSIS — E785 Hyperlipidemia, unspecified: Secondary | ICD-10-CM | POA: Diagnosis not present

## 2022-06-12 DIAGNOSIS — G2581 Restless legs syndrome: Secondary | ICD-10-CM | POA: Diagnosis not present

## 2022-06-12 DIAGNOSIS — Z7982 Long term (current) use of aspirin: Secondary | ICD-10-CM | POA: Diagnosis not present

## 2022-06-12 DIAGNOSIS — I2510 Atherosclerotic heart disease of native coronary artery without angina pectoris: Secondary | ICD-10-CM | POA: Diagnosis not present

## 2022-06-12 NOTE — Telephone Encounter (Signed)
Rebecca Mcintyre with [employer] [phone] [place] to leave a detailed message on this line  FYI: Pt just released from hospital  Pt now home 84 when walking to bathroom After 5 minutes, it went back up to 90 Teetering between 91-93 - if she talks, it drops again  PT order already in, but she would like to add Verbal Order for [place] informed MD is OOO until 1st or 2nd week of February.

## 2022-06-13 ENCOUNTER — Ambulatory Visit: Payer: Medicare Other | Admitting: Nurse Practitioner

## 2022-06-14 NOTE — Telephone Encounter (Signed)
Vitals mentioned is Oxygen Saturation levels.    Requesting verbal order for Nursing.

## 2022-06-14 NOTE — Telephone Encounter (Signed)
Called Margarita Grizzle verbal okay given for nursing.

## 2022-06-18 ENCOUNTER — Encounter: Payer: Self-pay | Admitting: Cardiology

## 2022-06-18 ENCOUNTER — Encounter (HOSPITAL_BASED_OUTPATIENT_CLINIC_OR_DEPARTMENT_OTHER): Payer: Self-pay | Admitting: Pulmonary Disease

## 2022-06-18 ENCOUNTER — Encounter: Payer: Self-pay | Admitting: Internal Medicine

## 2022-06-18 DIAGNOSIS — J449 Chronic obstructive pulmonary disease, unspecified: Secondary | ICD-10-CM | POA: Diagnosis not present

## 2022-06-18 DIAGNOSIS — J432 Centrilobular emphysema: Secondary | ICD-10-CM

## 2022-06-18 DIAGNOSIS — J9611 Chronic respiratory failure with hypoxia: Secondary | ICD-10-CM | POA: Diagnosis not present

## 2022-06-18 DIAGNOSIS — E119 Type 2 diabetes mellitus without complications: Secondary | ICD-10-CM | POA: Diagnosis not present

## 2022-06-18 DIAGNOSIS — R0600 Dyspnea, unspecified: Secondary | ICD-10-CM | POA: Diagnosis not present

## 2022-06-18 DIAGNOSIS — Z48813 Encounter for surgical aftercare following surgery on the respiratory system: Secondary | ICD-10-CM | POA: Diagnosis not present

## 2022-06-18 DIAGNOSIS — I5032 Chronic diastolic (congestive) heart failure: Secondary | ICD-10-CM | POA: Diagnosis not present

## 2022-06-19 NOTE — Telephone Encounter (Signed)
[name] sent another message checking to see if you are able to view the pulmonary function test that pt had done at [facility]. Dr. [name], please advise.

## 2022-06-19 NOTE — Telephone Encounter (Signed)
Mychart message sent by pt's daughter: Rebecca Mcintyre (proxy for [name])  P [facility]-Pulm Clinical Pool (supporting Rigoberto Noel, MD)21 hours ago (3:19 PM)    Hello, we had a busy day learning about the zephyr valves in [place]. She currently is not a candidate but we learned a lot. After her PFT we need a new prescription for a larger oxygen concentrator at home. At rest currently she needs 4 liters but while walking she requires 6 liters. We also need a new referral to pulmonary rehab in [place]. Let me know what you need if anything from [place] to get this ordered. Can you access her pft from the [facility] chart at [facility]? Thanks so much Dr [name]!  [name] & [name]     Dr. [name], please advise.

## 2022-06-20 NOTE — Telephone Encounter (Signed)
Spoke to patient's daughter, [name], and scheduled her for PharmD appt tomorrow. Patient's echo is in [place] under "Other Results" tab - echo was done on 06/18/22. [name] wanted me to thank you for all you do.

## 2022-06-21 ENCOUNTER — Ambulatory Visit: Payer: Medicare Other | Attending: Cardiovascular Disease | Admitting: Pharmacist

## 2022-06-21 VITALS — Ht 63.0 in | Wt 237.0 lb

## 2022-06-21 DIAGNOSIS — E119 Type 2 diabetes mellitus without complications: Secondary | ICD-10-CM | POA: Diagnosis not present

## 2022-06-21 DIAGNOSIS — I2510 Atherosclerotic heart disease of native coronary artery without angina pectoris: Secondary | ICD-10-CM | POA: Diagnosis not present

## 2022-06-21 NOTE — Patient Instructions (Addendum)
It was nice meeting you two today  We would like to start you on a new medication called Mounjaro  which you would inject once per week  I will complete the prior authorization for you and contact you when it is approved.    Please reach out to me at the end of the month and we will discuss increasing your dose  Concentrate on vegetables, lean proteins, and whole grains  Please let me know if you have any questions  Karren Cobble, PharmD, [place], [place], [place], [place], [place], [address] Phone: [phone], Fax: [phone]

## 2022-06-23 ENCOUNTER — Telehealth: Payer: Self-pay | Admitting: Pharmacist

## 2022-06-23 ENCOUNTER — Encounter: Payer: Self-pay | Admitting: Pharmacist

## 2022-06-23 ENCOUNTER — Other Ambulatory Visit: Payer: Self-pay | Admitting: Internal Medicine

## 2022-06-23 DIAGNOSIS — E119 Type 2 diabetes mellitus without complications: Secondary | ICD-10-CM

## 2022-06-23 MED ORDER — MOUNJARO 2.5 MG/0.5ML ~~LOC~~ SOAJ: 2.5000 mg | SUBCUTANEOUS | 0 refills | Status: DC

## 2022-06-23 NOTE — Telephone Encounter (Signed)
PA for [facility] approved through 05/27/23

## 2022-06-23 NOTE — Progress Notes (Signed)
Patient ID: Rebecca Mcintyre                 DOB: 1946-01-12                    MRN: [id number]     HPI: Rebecca Mcintyre is a 77 y.o. female patient referred to pharmacy clinic by Dr Percival Spanish to initiate weight loss therapy with GLP1-RA. PMH is significant for obesity, HTN, CAD, CHF, COPD on O2, T2DM (last A1c 6.4%) and HLD. Most recent BMI 41.99 kg/m2.  Patient presents today with daughter Rebecca Mcintyre. Uses wheelchair and on oxygen. Struggles breathing with minimal exertion.  Seen earlier this week by by [facility] pulmonology for persistant dyspnea. Further pulmonary tests pending however physicians recommended weight loss to assist with breathing.  Patient T2DM currently controlled on metformin and Farxiga. A1c had previously been as high as 10.1% although patient believes this may be due to frequent steroid use. Reports FBG levels are controlled.  Labs: Lab Results  Component Value Date   HGBA1C 6.4 (A) 04/15/2022    Wt Readings from Last 1 Encounters:  06/04/22 232 lb (105.2 kg)    BP Readings from Last 1 Encounters:  06/04/22 126/62   Pulse Readings from Last 1 Encounters:  06/04/22 82       Component Value Date/Time   CHOL 172 06/04/2022 1052   TRIG 229 (H) 06/04/2022 1052   HDL 59 06/04/2022 1052   CHOLHDL 2.9 06/04/2022 1052   CHOLHDL 4 04/10/2021 1025   VLDL 31.6 04/10/2021 1025   LDLCALC 76 06/04/2022 1052   LDLDIRECT 116.0 02/05/2019 1105    Past Medical History:  Diagnosis Date   Acute on chronic respiratory failure with hypoxia and hypercapnia (HCC) 11/05/2018   Allergy    ASCUS on Pap smear    had repeat x 2 normal with [name] Plan 2011   Coronary artery disease    Gout    Hyperlipidemia    Hypertension    Myocardial infarction [facility])    2002 95% proximal LAD stenosis with thrombus followed by 90% stenosis, the first diagonal 80% stenosis, circumflex 30% stenosis, circumflex obtuse marginal subbranch 70% stenosis, PDA 40% stenosis. She had a drug-eluting stent  placement with a Pixel stent   Osteopenia    Solitary kidney     Current Outpatient Medications on File Prior to Visit  Medication Sig Dispense Refill   ACCU-CHEK GUIDE test strip USE AS DIRECTED TWICE A DAY 100 strip 3   Accu-Chek Softclix Lancets lancets Use as instructed twice a day Diagnosis E11.9 100 each 3   acetaminophen (TYLENOL) 500 MG tablet Take 1,000 mg by mouth every 6 (six) hours as needed for mild pain.     albuterol (PROVENTIL) (2.5 MG/3ML) 0.083% nebulizer solution Take 3 mLs (2.5 mg total) by nebulization every 6 (six) hours as needed for wheezing or shortness of breath. 300 mL 11   albuterol (VENTOLIN HFA) 108 (90 Base) MCG/ACT inhaler USE 2 PUFFS EVERY 6 HOURS AS NEEDED FOR WHEEZE OR SHORTNESS OF BREATH (Patient taking differently: Inhale 2 puffs into the lungs every 6 (six) hours as needed for wheezing or shortness of breath.) 8.5 each 5   allopurinol (ZYLOPRIM) 100 MG tablet TAKE 1 TABLET BY MOUTH TWICE A DAY (Patient taking differently: Take 200 mg by mouth at bedtime.) 180 tablet 1   arformoterol (BROVANA) 15 MCG/2ML NEBU Take 2 mLs (15 mcg total) by nebulization 2 (two) times daily. 120 mL 11  aspirin 81 MG tablet Take 81 mg by mouth daily.     Blood Glucose Monitoring Suppl (ACCU-CHEK GUIDE) w/Device KIT Use as directed twice a day Diagnosis E11.9 1 kit 2   budesonide (PULMICORT) 0.5 MG/2ML nebulizer solution Take 2 mLs (0.5 mg total) by nebulization 2 (two) times daily. 120 mL 12   calcium carbonate (TUMS EX) 750 MG chewable tablet Chew 750 mg by mouth daily as needed for heartburn.     dapagliflozin propanediol (FARXIGA) 10 MG TABS tablet Take 10 mg by mouth daily.     furosemide (LASIX) 40 MG tablet TAKE 1 TABLET BY MOUTH EVERY DAY (Patient taking differently: Take 40 mg by mouth daily.) 90 tablet 0   guaiFENesin (MUCINEX) 600 MG 12 hr tablet Take 2 tablets (1,200 mg total) by mouth 2 (two) times daily. 60 tablet 0   metFORMIN (GLUCOPHAGE) 1000 MG tablet Take 1  tablet (1,000 mg total) by mouth 2 (two) times daily with a meal. 60 tablet 3   metoprolol tartrate (LOPRESSOR) 25 MG tablet Take 1 tablet (25 mg total) by mouth daily. TAKE 1/2 TABLETS BY MOUTH 2 TIMES DAILY     nitroGLYCERIN (NITROSTAT) 0.4 MG SL tablet PLACE 1 TABLET UNDER THE TONGUE EVERY 5 MINUTES AS NEEDED. (Patient taking differently: Place 0.4 mg under the tongue every 5 (five) minutes as needed for chest pain.) 25 tablet 1   pantoprazole (PROTONIX) 40 MG tablet Take 1 tablet (40 mg total) by mouth daily. 30 tablet 3   Polyethylene Glycol 400 (VISINE DRY EYE RELIEF OP) Place 1 drop into both eyes daily as needed (dry eyes).     potassium chloride (KLOR-CON M10) 10 MEQ tablet TAKE 1 TABLET BY MOUTH TWICE A DAY (Patient taking differently: Take 20 mEq by mouth at bedtime.) 180 tablet 0   predniSONE (DELTASONE) 10 MG tablet Prednisone dosing: Take  Prednisone '40mg'$  (4 tabs) x 2 days, then taper to '30mg'$  (3 tabs) x 3 days, then '20mg'$  (2 tabs) x 3days, then '10mg'$  (1 tab) x 3days, then OFF. 26 tablet 0   revefenacin (YUPELRI) 175 MCG/3ML nebulizer solution Take 3 mLs (175 mcg total) by nebulization daily. 90 mL 6   rOPINIRole (REQUIP) 0.5 MG tablet TAKE 1 TABLET AT BEDTIME FOR RESTLESS LEG (Patient taking differently: Take 0.5 mg by mouth at bedtime. TAKE 1 TABLET AT BEDTIME FOR RESTLESS LEG) 90 tablet 0   rosuvastatin (CRESTOR) 20 MG tablet Take 1 tablet (20 mg total) by mouth daily. (Patient taking differently: Take 20 mg by mouth at bedtime.) 90 tablet 3   Spacer/Aero-Hold Chamber Bags MISC Please show patient how to use 1 each 0   No current facility-administered medications on file prior to visit.    Allergies  Allergen Reactions   Atorvastatin     REACTION: myalgias  and liver     Assessment/Plan:  1. Weight lossT2DM - Patient last A1c 6.4 which is at goal of <7.0% however care team is recommending GLP1a therapy due to obesity and to help breathing. Is candidate for semaglutide or  tirzepatide due to T2DM diagnosis.  Discussed pathophysiology of weight gain with patient in depth and importance of diet changes since patient unable to exercise. Recommended increasing vegetables, lean proteins, healhy fats, and whole grains.    Used demo pens of Mounjaro and Ozempic explainde differences in administration to see which patient was more comfortable with. Patient believes she will be able to use Mounjaro easier due to single pen dosing. Educated on mechanism of  action, storage, site selection, administration and possible adverse effects.  Confirmed patient has no personal or family history of medullary thyroid carcinoma (MTC) or Multiple Endocrine Neoplasia syndrome type 2 (MEN 2). Injection technique reviewed at today's visit.  Advised patient on common side effects including nausea, diarrhea, dyspepsia, decreased appetite, and fatigue. Counseled patient on reducing meal size and how to titrate medication to minimize side effects. Counseled patient to call if intolerable side effects or if experiencing dehydration, abdominal pain, or dizziness.   Will complete PA and contact patient when approved. Will change to Ozempic if plan prefers and patient understands.  Start Mounjaro 2.'5mg'$  weekly Follow up in 1 month  Karren Cobble, PharmD, [facility], [place], [place], [place], [place], [address] Phone: [phone], Fax: [phone]

## 2022-06-23 NOTE — Telephone Encounter (Signed)
PA for [facility] submitted. Key: [id number]

## 2022-06-23 NOTE — Addendum Note (Signed)
Addended by: Rollen Sox on: 06/23/2022 10:19 AM   Modules accepted: Orders

## 2022-06-24 DIAGNOSIS — Z9181 History of falling: Secondary | ICD-10-CM | POA: Diagnosis not present

## 2022-06-24 DIAGNOSIS — I110 Hypertensive heart disease with heart failure: Secondary | ICD-10-CM | POA: Diagnosis not present

## 2022-06-24 DIAGNOSIS — E119 Type 2 diabetes mellitus without complications: Secondary | ICD-10-CM | POA: Diagnosis not present

## 2022-06-24 DIAGNOSIS — Z7982 Long term (current) use of aspirin: Secondary | ICD-10-CM | POA: Diagnosis not present

## 2022-06-24 DIAGNOSIS — E785 Hyperlipidemia, unspecified: Secondary | ICD-10-CM | POA: Diagnosis not present

## 2022-06-24 DIAGNOSIS — Z7952 Long term (current) use of systemic steroids: Secondary | ICD-10-CM | POA: Diagnosis not present

## 2022-06-24 DIAGNOSIS — Z9981 Dependence on supplemental oxygen: Secondary | ICD-10-CM | POA: Diagnosis not present

## 2022-06-24 DIAGNOSIS — G2581 Restless legs syndrome: Secondary | ICD-10-CM | POA: Diagnosis not present

## 2022-06-24 DIAGNOSIS — J441 Chronic obstructive pulmonary disease with (acute) exacerbation: Secondary | ICD-10-CM | POA: Diagnosis not present

## 2022-06-24 DIAGNOSIS — I2510 Atherosclerotic heart disease of native coronary artery without angina pectoris: Secondary | ICD-10-CM | POA: Diagnosis not present

## 2022-06-24 DIAGNOSIS — I252 Old myocardial infarction: Secondary | ICD-10-CM | POA: Diagnosis not present

## 2022-06-24 DIAGNOSIS — Z7984 Long term (current) use of oral hypoglycemic drugs: Secondary | ICD-10-CM | POA: Diagnosis not present

## 2022-06-24 DIAGNOSIS — I509 Heart failure, unspecified: Secondary | ICD-10-CM | POA: Diagnosis not present

## 2022-06-27 ENCOUNTER — Telehealth (HOSPITAL_BASED_OUTPATIENT_CLINIC_OR_DEPARTMENT_OTHER): Payer: Self-pay

## 2022-06-27 DIAGNOSIS — J9611 Chronic respiratory failure with hypoxia: Secondary | ICD-10-CM

## 2022-06-27 DIAGNOSIS — J441 Chronic obstructive pulmonary disease with (acute) exacerbation: Secondary | ICD-10-CM

## 2022-06-27 NOTE — Telephone Encounter (Signed)
Sent in order for Oxygen to [employer]. The order states 4 Liters at rest and 6 Liters at exertion. Noting further needed.

## 2022-06-28 DIAGNOSIS — J9611 Chronic respiratory failure with hypoxia: Secondary | ICD-10-CM | POA: Diagnosis not present

## 2022-06-29 DIAGNOSIS — J449 Chronic obstructive pulmonary disease, unspecified: Secondary | ICD-10-CM | POA: Diagnosis not present

## 2022-06-29 DIAGNOSIS — J9612 Chronic respiratory failure with hypercapnia: Secondary | ICD-10-CM | POA: Diagnosis not present

## 2022-06-29 DIAGNOSIS — J9611 Chronic respiratory failure with hypoxia: Secondary | ICD-10-CM | POA: Diagnosis not present

## 2022-07-07 ENCOUNTER — Other Ambulatory Visit: Payer: Self-pay | Admitting: Internal Medicine

## 2022-07-10 ENCOUNTER — Telehealth: Payer: Self-pay | Admitting: Internal Medicine

## 2022-07-10 NOTE — Telephone Encounter (Signed)
Checking on progress of plan of care that has been faxed previously. Order [id number]

## 2022-07-11 LAB — ACID FAST CULTURE WITH REFLEXED SENSITIVITIES (MYCOBACTERIA): Acid Fast Culture: NEGATIVE

## 2022-07-12 ENCOUNTER — Encounter: Payer: Self-pay | Admitting: *Deleted

## 2022-07-12 ENCOUNTER — Ambulatory Visit: Payer: Medicare Other

## 2022-07-12 NOTE — Progress Notes (Signed)
Care Management & Coordination Services Pharmacy Note  07/12/2022 Name:  Rebecca Mcintyre MRN:  [id number] DOB:  05/15/1946  Summary: -Pt due for urine microalbumin screen at next OV (pt declined to sch at this time) -A1C at goal <7.0 and BP at goal <130/80 -Breathing well-controlled with Judithann Sauger  Recommendations/Changes made from today's visit: -Continue medication therapy -Continue twice daily BG monitoring -Farxiga at pharmacy and not through PAP for 2024, using [facility] copay card -Encouraged scheduling routine f/u appt with PCP  Follow up plan: Pharmacist visit in 3 months   Subjective: Rebecca Mcintyre is an 77 y.o. year old female who is a primary patient of Panosh, Standley Brooking, MD.  The care coordination team was consulted for assistance with disease management and care coordination needs.    Engaged with patient by telephone for follow up visit.  Recent office visits: None  Hospital visits: None in previous 6 months   Objective:  Lab Results  Component Value Date   CREATININE 1.35 (H) 05/30/2022   BUN 33 (H) 05/30/2022   GFR 55.20 (L) 01/14/2022   EGFR 74 07/05/2021   GFRNONAA 41 (L) 05/30/2022   GFRAA >60 11/12/2018   NA 134 (L) 05/30/2022   K 3.2 (L) 05/30/2022   CALCIUM 9.0 05/30/2022   CO2 32 05/30/2022   GLUCOSE 130 (H) 05/30/2022    Lab Results  Component Value Date/Time   HGBA1C 6.4 (A) 04/15/2022 12:18 PM   HGBA1C 7.5 (H) 04/10/2021 10:25 AM   HGBA1C 7.4 (H) 10/03/2020 09:42 AM   GFR 55.20 (L) 01/14/2022 03:22 PM   GFR 52.76 (L) 10/03/2021 11:26 AM   MICROALBUR 2.8 (H) 03/27/2020 08:06 AM   MICROALBUR 0.7 04/02/2019 09:05 AM    Last diabetic Eye exam:  Lab Results  Component Value Date/Time   HMDIABEYEEXA No Retinopathy 05/10/2021 12:00 AM    Last diabetic Foot exam: No results found for: "HMDIABFOOTEX"   Lab Results  Component Value Date   CHOL 172 06/04/2022   HDL 59 06/04/2022   LDLCALC 76 06/04/2022   LDLDIRECT 116.0 02/05/2019    TRIG 229 (H) 06/04/2022   CHOLHDL 2.9 06/04/2022       Latest Ref Rng & Units 05/25/2022   12:30 PM 10/03/2021   11:26 AM 11/28/2020    3:44 PM  Hepatic Function  Total Protein 6.5 - 8.1 g/dL 7.4  7.8  6.3   Albumin 3.5 - 5.0 g/dL 3.8  4.3  3.1   AST 15 - 41 U/L 30  21  28   $ ALT 0 - 44 U/L 28  17  25   $ Alk Phosphatase 38 - 126 U/L 75  80  67   Total Bilirubin 0.3 - 1.2 mg/dL 0.6  0.5  0.7     Lab Results  Component Value Date/Time   TSH 1.630 01/31/2022 12:33 PM   TSH 3.354 11/29/2020 12:03 AM   TSH 3.37 10/03/2020 09:42 AM   FREET4 0.81 10/03/2020 09:42 AM       Latest Ref Rng & Units 05/28/2022    5:31 AM 05/27/2022    2:16 PM 05/27/2022    3:46 AM  CBC  WBC 4.0 - 10.5 K/uL 11.7  11.6  12.3   Hemoglobin 12.0 - 15.0 g/dL 13.9  14.0  13.6   Hematocrit 36.0 - 46.0 % 41.4  43.1  41.0   Platelets 150 - 400 K/uL 169  220  176     Lab Results  Component Value Date/Time  [id number] 11/29/2020 12:03 AM   VITAMINB12 232 10/03/2020 09:42 AM    Clinical ASCVD: Yes  The ASCVD Risk score (Arnett DK, et al., 2019) failed to calculate for the following reasons:   The patient has a prior MI or stroke diagnosis       04/15/2022   11:27 AM 04/03/2022    8:28 AM 12/24/2021    2:40 PM  Depression screen PHQ 2/9  Decreased Interest 0 0 0  Down, Depressed, Hopeless 0 0 0  PHQ - 2 Score 0 0 0  Altered sleeping 0    Tired, decreased energy 1    Change in appetite 0    Feeling bad or failure about yourself  0    Trouble concentrating 0    Moving slowly or fidgety/restless 0    Suicidal thoughts 0    PHQ-9 Score 1    Difficult doing work/chores Somewhat difficult       Social History   Tobacco Use  Smoking Status Former   Packs/day: 1.00   Years: 39.00   Total pack years: 39.00   Types: Cigarettes   Quit date: 11/04/2018   Years since quitting: 3.6  Smokeless Tobacco Never   BP Readings from Last 3 Encounters:  06/04/22 126/62  06/04/22 126/68  05/30/22 (!) 101/54    Pulse Readings from Last 3 Encounters:  06/04/22 82  06/04/22 90  05/30/22 79   Wt Readings from Last 3 Encounters:  06/21/22 237 lb (107.5 kg)  06/04/22 232 lb (105.2 kg)  06/04/22 232 lb (105.2 kg)   BMI Readings from Last 3 Encounters:  06/21/22 41.98 kg/m  06/04/22 41.10 kg/m  06/04/22 41.10 kg/m    Allergies  Allergen Reactions   Atorvastatin     REACTION: myalgias  and liver    Medications Reviewed Today     Reviewed by Rollen Sox, [facility] (Pharmacist) on 06/23/22 at 1014  Med List Status: <None>   Medication Order Taking? Sig Documenting Provider Last Dose Status Informant  ACCU-CHEK GUIDE test strip [id number] No USE AS DIRECTED TWICE A DAY Panosh, Standley Brooking, MD Taking Active Self, Child  Accu-Chek Softclix Lancets lancets [id number] No Use as instructed twice a day Diagnosis E11.9 Panosh, Standley Brooking, MD Taking Active Self, Child  acetaminophen (TYLENOL) 500 MG tablet [id number] No Take 1,000 mg by mouth every 6 (six) hours as needed for mild pain. [provider] Taking Active Child, Self  albuterol (PROVENTIL) (2.5 MG/3ML) 0.083% nebulizer solution [id number] No Take 3 mLs (2.5 mg total) by nebulization every 6 (six) hours as needed for wheezing or shortness of breath. Rai, Vernelle Emerald, MD Taking Active   albuterol (VENTOLIN HFA) 108 (90 Base) MCG/ACT inhaler [id number] No USE 2 PUFFS EVERY 6 HOURS AS NEEDED FOR WHEEZE OR SHORTNESS OF BREATH  Patient taking differently: Inhale 2 puffs into the lungs every 6 (six) hours as needed for wheezing or shortness of breath.   Rigoberto Noel, MD Taking Active Self, Child  allopurinol (ZYLOPRIM) 100 MG tablet [id number] No TAKE 1 TABLET BY MOUTH TWICE A DAY  Patient taking differently: Take 200 mg by mouth at bedtime.   Panosh, Standley Brooking, MD Taking Active Self, Child  arformoterol (BROVANA) 15 MCG/2ML NEBU [id number] No Take 2 mLs (15 mcg total) by nebulization 2 (two) times daily. Mendel Corning, MD Taking Active    aspirin 81 MG tablet [id number] No Take 81 mg by mouth daily. [provider] Taking Active Child, Self  Blood Glucose  Monitoring Suppl (ACCU-CHEK GUIDE) w/Device KIT [id number] No Use as directed twice a day Diagnosis E11.9 Panosh, Standley Brooking, MD Taking Active Self, Child  budesonide (PULMICORT) 0.5 MG/2ML nebulizer solution [id number] No Take 2 mLs (0.5 mg total) by nebulization 2 (two) times daily. Rai, Vernelle Emerald, MD Taking Active   calcium carbonate (TUMS EX) 750 MG chewable tablet [id number] No Chew 750 mg by mouth daily as needed for heartburn. [provider] Taking Active Self, Child  dapagliflozin propanediol (FARXIGA) 10 MG TABS tablet [id number] No Take 10 mg by mouth daily. [provider] Taking Active   furosemide (LASIX) 40 MG tablet [id number] No TAKE 1 TABLET BY MOUTH EVERY DAY  Patient taking differently: Take 40 mg by mouth daily.   Panosh, Standley Brooking, MD Taking Active Self, Child  guaiFENesin (MUCINEX) 600 MG 12 hr tablet [id number] No Take 2 tablets (1,200 mg total) by mouth 2 (two) times daily. Rai, Vernelle Emerald, MD Taking Active   metFORMIN (GLUCOPHAGE) 1000 MG tablet [id number] No Take 1 tablet (1,000 mg total) by mouth 2 (two) times daily with a meal. Rai, Ripudeep K, MD Taking Active   metoprolol tartrate (LOPRESSOR) 25 MG tablet [id number] No Take 1 tablet (25 mg total) by mouth daily. TAKE 1/2 TABLETS BY MOUTH 2 TIMES DAILY Rai, Ripudeep K, MD Taking Active   nitroGLYCERIN (NITROSTAT) 0.4 MG SL tablet [id number] No PLACE 1 TABLET UNDER THE TONGUE EVERY 5 MINUTES AS NEEDED.  Patient taking differently: Place 0.4 mg under the tongue every 5 (five) minutes as needed for chest pain.   Panosh, Standley Brooking, MD Taking Active Child, Self  pantoprazole (PROTONIX) 40 MG tablet [id number] No Take 1 tablet (40 mg total) by mouth daily. Rai, Vernelle Emerald, MD Taking Active   Polyethylene Glycol 400 (VISINE DRY EYE RELIEF OP) [id number] No Place 1 drop into both eyes daily as needed  (dry eyes). [provider] Taking Active Self, Child  potassium chloride (KLOR-CON M10) 10 MEQ tablet [id number] No TAKE 1 TABLET BY MOUTH TWICE A DAY  Patient taking differently: Take 20 mEq by mouth at bedtime.   Panosh, Standley Brooking, MD Taking Active Self, Child  predniSONE (DELTASONE) 10 MG tablet [id number] No Prednisone dosing: Take  Prednisone 35m (4 tabs) x 2 days, then taper to 354m(3 tabs) x 3 days, then 2031m2 tabs) x 3days, then 1m25m tab) x 3days, then OFF. Rai, RipuVernelle Emerald Taking Active   revefenacin (YUPSaint ALPhonsus Eagle Health Plz-Er5 MCG/3ML nebulizer solution [id number] 3 mLs (175 mcg total) by nebulization daily. AlvaRigoberto Noel Taking Active   rOPINIRole (REQUIP) 0.5 MG tablet [id number] 1 TABLET AT BEDTIME FOR RESTLESS LEG  Patient taking differently: Take 0.5 mg by mouth at bedtime. TAKE 1 TABLET AT BEDTIME FOR RESTLESS LEG   AlvaRigoberto Noel Taking Active Self, Child  rosuvastatin (CRESTOR) 20 MG tablet [id number] 1 tablet (20 mg total) by mouth daily.  Patient taking differently: Take 20 mg by mouth at bedtime.   HochMinus Breeding Taking Active Self, Child  Spacer/Aero-Hold Chamber Bags [id number] show patient how to use AlvaRigoberto Noel Taking Active Self, Child            SDOH:  (Social Determinants of Health) assessments and interventions performed: Yes SDOH Interventions    Flowsheet Row Telephone from [facility] from 05/[facility]/2022 in [facility]  Clinical Support from 04/03/2022 in [place] [facility] [place] Management from 03/12/2022 in [place] [facility] [place] Patient Outreach Telephone from 12/24/2021 in [place] Management from 04/06/2021 in [place] [facility] [place] Interventions Intervention Not Indicated -- Intervention Not Indicated -- Intervention Not Indicated Intervention Not Indicated  Housing Interventions -- -- Intervention Not Indicated -- Intervention Not Indicated Intervention Not Indicated  Transportation Interventions Intervention Not Indicated Intervention Not Indicated, Inpatient TOC, Patient Resources (Friends/Family) Intervention Not Indicated Intervention Not Indicated Intervention Not Indicated Intervention Not Indicated  Utilities Interventions -- -- Intervention Not Indicated -- -- --  Alcohol Usage Interventions -- -- Intervention Not Indicated (Score <7) -- -- --  Financial Strain Interventions -- -- Intervention Not Indicated -- -- Maricela Bo with pharmacist for assistance with Farxiga]  Physical Activity Interventions -- -- Intervention Not Indicated -- -- --  Stress Interventions -- -- Intervention Not Indicated -- -- Intervention Not Indicated  Social Connections Interventions -- -- Intervention Not Indicated -- -- --       Medication Assistance:  Breztri approved through [employer] and [place] through [employer] for 2024.  Medication Access: Within the past 30 days, how often has patient missed a dose of medication? None Is a pillbox or other method used to improve adherence? Yes  Factors that may affect medication adherence? financial need Are meds synced by current pharmacy? No  Are meds delivered by current pharmacy? No  Does patient experience delays in picking up medications due to transportation concerns? No   [facility] Reviewed: Is patient disadvantaged to use [facility]?: Yes  Current Rx insurance plan: [facility] Name and [facility] of [id number] [address]Phone: [phone]: [address]Phone: [phone] Fax: [phone] [facility] services reviewed with patient today?: No  Patient requests to transfer care to [facility]?: No  Reason patient declined to change pharmacies: Not mentioned at this visit  Compliance/Adherence/Medication fill history: Care Gaps: Urine microalbumin past due Diabetic eye exam Vaccines: COVID, Tdap  Star-Rating Drugs: Farxiga 12m- gets on PAP Metformin 100065mDC 98% Rosuvastatin 81m59mC 100%   Assessment/Plan   Diabetes (A1c goal <7%) -Controlled -Current medications: Farxiga 10mg60mab daily Appropriate, Effective, Safe, Accessible Metformin 1000mg 77mAppropriate, Effective, Safe, Accessible Mounjaro 2.5mg in7mskin each week Appropriate, Effective, Safe, Accessible -Medications previously tried: None  -Current home glucose readings fasting glucose: 94 this morning post prandial glucose: 118 last night before bed -Denies hypoglycemic/hyperglycemic symptoms -Current meal patterns: Not discussed -Current exercise: Not discussed -Educated on A1c and blood sugar goals; Complications of diabetes including kidney damage, retinal damage, and cardiovascular disease; Benefits of weight loss; Prevention and management of hypoglycemic episodes; -Counseled to check feet daily and get yearly eye exams -Recommended to continue current medication Assessed patient finances. Denied [place] PAP for 2024, approved for [employer]  COPD (Goal: control symptoms and prevent exacerbations) -Controlled -Current treatment  Breztri 2 puffs twice daily Appropriate, Effective, Safe, Accessible -Medications previously tried: Trelegy, Anoro, Orma Render Grade: Gold 2 (FEV1 50-79%) -Current COPD Classification:  A (low sx, 0-1 moderate exacerbations, no hospitalizations) -Exacerbations requiring treatment in last 6 months: None -  Patient reports consistent use of maintenance inhaler -Frequency of rescue inhaler use: less than once  weekly -Counseled on Proper inhaler technique; Differences between maintenance and rescue inhalers -Recommended to continue current medication Assessed patient finances. Gets breztri through [employer] for [place] Pharmacist [phone]

## 2022-07-15 ENCOUNTER — Other Ambulatory Visit: Payer: Self-pay | Admitting: Family

## 2022-07-15 DIAGNOSIS — Z9981 Dependence on supplemental oxygen: Secondary | ICD-10-CM

## 2022-07-15 DIAGNOSIS — Z7982 Long term (current) use of aspirin: Secondary | ICD-10-CM

## 2022-07-15 DIAGNOSIS — E785 Hyperlipidemia, unspecified: Secondary | ICD-10-CM

## 2022-07-15 DIAGNOSIS — I509 Heart failure, unspecified: Secondary | ICD-10-CM

## 2022-07-15 DIAGNOSIS — J441 Chronic obstructive pulmonary disease with (acute) exacerbation: Secondary | ICD-10-CM | POA: Diagnosis not present

## 2022-07-15 DIAGNOSIS — G2581 Restless legs syndrome: Secondary | ICD-10-CM

## 2022-07-15 DIAGNOSIS — I252 Old myocardial infarction: Secondary | ICD-10-CM

## 2022-07-15 DIAGNOSIS — Z7984 Long term (current) use of oral hypoglycemic drugs: Secondary | ICD-10-CM

## 2022-07-15 DIAGNOSIS — Z7952 Long term (current) use of systemic steroids: Secondary | ICD-10-CM

## 2022-07-15 DIAGNOSIS — E669 Obesity, unspecified: Secondary | ICD-10-CM

## 2022-07-15 DIAGNOSIS — Z9181 History of falling: Secondary | ICD-10-CM

## 2022-07-15 DIAGNOSIS — E119 Type 2 diabetes mellitus without complications: Secondary | ICD-10-CM | POA: Diagnosis not present

## 2022-07-15 DIAGNOSIS — I2510 Atherosclerotic heart disease of native coronary artery without angina pectoris: Secondary | ICD-10-CM

## 2022-07-15 DIAGNOSIS — I110 Hypertensive heart disease with heart failure: Secondary | ICD-10-CM | POA: Diagnosis not present

## 2022-07-15 MED ORDER — DAPAGLIFLOZIN PROPANEDIOL 10 MG PO TABS: 10.0000 mg | ORAL_TABLET | Freq: Every day | ORAL | 3 refills | Status: DC

## 2022-07-16 NOTE — Telephone Encounter (Signed)
Certification for home health completed and signed last visit 11 23 . Followed by pulmonary team also

## 2022-07-17 NOTE — Telephone Encounter (Signed)
Form was faxed yesterday. Received OK confirmation.

## 2022-07-18 MED ORDER — MOUNJARO 5 MG/0.5ML ~~LOC~~ SOAJ: 5.0000 mg | SUBCUTANEOUS | 0 refills | Status: DC

## 2022-07-18 NOTE — Addendum Note (Signed)
Addended by: Rollen Sox on: 07/18/2022 01:44 PM   Modules accepted: Orders

## 2022-07-21 ENCOUNTER — Other Ambulatory Visit: Payer: Self-pay | Admitting: Cardiology

## 2022-07-21 DIAGNOSIS — E119 Type 2 diabetes mellitus without complications: Secondary | ICD-10-CM

## 2022-07-22 ENCOUNTER — Telehealth (HOSPITAL_COMMUNITY): Payer: Self-pay

## 2022-07-22 NOTE — Telephone Encounter (Signed)
Called patient to see if she was interested in participating in the Pulmonary Rehab Program. Patient stated yes. Patient will come in for orientation on 07/24/22 @ 10:30AM and will attend the 10:15AM exercise class. Went over insurance, patient verbalized understanding.

## 2022-07-22 NOTE — Telephone Encounter (Signed)
Pt insurance is active and benefits verified through [facility] Medicare. Co-pay $15.00, DED $0.00/$0.00 met, out of pocket $3,600.00/$988.45 met, co-insurance 0%. No pre-authorization required. Nataye/[employer] Medicare, 07/22/22 @ 11:29AM, [id number]

## 2022-07-23 ENCOUNTER — Telehealth (HOSPITAL_COMMUNITY): Payer: Self-pay

## 2022-07-23 NOTE — Telephone Encounter (Signed)
Pt called to confirm appointment for orientation to [place] tomorrow. Pt states she will be there.

## 2022-07-24 ENCOUNTER — Encounter (HOSPITAL_COMMUNITY)
Admission: RE | Admit: 2022-07-24 | Discharge: 2022-07-24 | Disposition: A | Discharge status: Home / Self Care | Payer: Medicare Other | Source: Ambulatory Visit | Attending: Pulmonary Disease | Admitting: Pulmonary Disease

## 2022-07-24 ENCOUNTER — Encounter (HOSPITAL_COMMUNITY): Payer: Self-pay

## 2022-07-24 VITALS — BP 102/56 | HR 90 | Ht 63.0 in | Wt 227.3 lb

## 2022-07-24 DIAGNOSIS — J449 Chronic obstructive pulmonary disease, unspecified: Secondary | ICD-10-CM | POA: Insufficient documentation

## 2022-07-24 NOTE — Progress Notes (Signed)
Rebecca Mcintyre 77 y.o. female  Initial Psychosocial Assessment  Patient's psychosocial assessment reveals that the pt lives alone. Pt is currently retired. Pt hobbies include watching tv, being on social media, reading and she enjoys the company of her 2 cats. Pt reports her stress level is low. Pt does not exhibit signs of depression, PHQ-9 and PHQ 2-9 scores were 0/0. Pt shows good  coping skills with positive outlook . We will continue to monitor and evaluate if any psychosocial needs arise.   Goal(s): Improved coping skills Help patient work toward returning to meaningful activities that improve patient's QOL and are attainable with patient's lung disease   07/24/2022 2:00 PM

## 2022-07-24 NOTE — Progress Notes (Signed)
Pulmonary Individual Treatment Plan  Patient Details  Name: Rebecca Mcintyre MRN: [id number] Date of Birth: 08/12/1945 Referring Provider:   April Manson Pulmonary Rehab Walk Test from 01/28/2019 in [facility]  Referring Provider Dr. [name]       Initial Encounter Date:  Flowsheet Row Pulmonary Rehab Walk Test from 01/28/2019 in [facility]  Date 01/28/19       Visit Diagnosis: Stage 2 moderate COPD by GOLD classification ([place])  Patient's Home Medications on Admission:   Current Outpatient Medications:    ACCU-CHEK GUIDE test strip, USE AS DIRECTED TWICE A DAY, Disp: 100 strip, Rfl: 3   Accu-Chek Softclix Lancets lancets, Use as instructed twice a day Diagnosis E11.9, Disp: 100 each, Rfl: 3   acetaminophen (TYLENOL) 500 MG tablet, Take 1,000 mg by mouth every 6 (six) hours as needed for mild pain., Disp: , Rfl:    albuterol (PROVENTIL) (2.5 MG/3ML) 0.083% nebulizer solution, Take 3 mLs (2.5 mg total) by nebulization every 6 (six) hours as needed for wheezing or shortness of breath., Disp: 300 mL, Rfl: 11   albuterol (VENTOLIN HFA) 108 (90 Base) MCG/ACT inhaler, USE 2 PUFFS EVERY 6 HOURS AS NEEDED FOR WHEEZE OR SHORTNESS OF BREATH (Patient taking differently: Inhale 2 puffs into the lungs every 6 (six) hours as needed for wheezing or shortness of breath.), Disp: 8.5 each, Rfl: 5   allopurinol (ZYLOPRIM) 100 MG tablet, TAKE 1 TABLET BY MOUTH TWICE A DAY, Disp: 180 tablet, Rfl: 1   arformoterol (BROVANA) 15 MCG/2ML NEBU, Take 2 mLs (15 mcg total) by nebulization 2 (two) times daily., Disp: 120 mL, Rfl: 11   aspirin 81 MG tablet, Take 81 mg by mouth daily., Disp: , Rfl:    Blood Glucose Monitoring Suppl (ACCU-CHEK GUIDE) w/Device KIT, Use as directed twice a day Diagnosis E11.9, Disp: 1 kit, Rfl: 2   budesonide (PULMICORT) 0.5 MG/2ML nebulizer solution, Take 2 mLs (0.5 mg total)  by nebulization 2 (two) times daily., Disp: 120 mL, Rfl: 12   calcium carbonate (TUMS EX) 750 MG chewable tablet, Chew 750 mg by mouth daily as needed for heartburn., Disp: , Rfl:    dapagliflozin propanediol (FARXIGA) 10 MG TABS tablet, Take 1 tablet (10 mg total) by mouth daily., Disp: 30 tablet, Rfl: 3   furosemide (LASIX) 40 MG tablet, TAKE 1 TABLET BY MOUTH EVERY DAY, Disp: 90 tablet, Rfl: 0   guaiFENesin (MUCINEX) 600 MG 12 hr tablet, Take 2 tablets (1,200 mg total) by mouth 2 (two) times daily., Disp: 60 tablet, Rfl: 0   metFORMIN (GLUCOPHAGE) 1000 MG tablet, Take 1 tablet (1,000 mg total) by mouth 2 (two) times daily with a meal., Disp: 60 tablet, Rfl: 3   metoprolol tartrate (LOPRESSOR) 25 MG tablet, TAKE 1/2 TABLETS BY MOUTH 2 TIMES DAILY, Disp: 90 tablet, Rfl: 0   nitroGLYCERIN (NITROSTAT) 0.4 MG SL tablet, PLACE 1 TABLET UNDER THE TONGUE EVERY 5 MINUTES AS NEEDED. (Patient taking differently: Place 0.4 mg under the tongue every 5 (five) minutes as needed for chest pain.), Disp: 25 tablet, Rfl: 1   pantoprazole (PROTONIX) 40 MG tablet, Take 1 tablet (40 mg total) by mouth daily., Disp: 30 tablet, Rfl: 3   Polyethylene Glycol 400 (VISINE DRY EYE RELIEF OP), Place 1 drop into both eyes daily as needed (dry eyes)., Disp: , Rfl:    potassium chloride (KLOR-CON M10) 10 MEQ tablet, TAKE  1 TABLET BY MOUTH TWICE A DAY (Patient taking differently: Take 20 mEq by mouth at bedtime.), Disp: 180 tablet, Rfl: 0   predniSONE (DELTASONE) 10 MG tablet, Prednisone dosing: Take  Prednisone '40mg'$  (4 tabs) x 2 days, then taper to '30mg'$  (3 tabs) x 3 days, then '20mg'$  (2 tabs) x 3days, then '10mg'$  (1 tab) x 3days, then OFF., Disp: 26 tablet, Rfl: 0   revefenacin (YUPELRI) 175 MCG/3ML nebulizer solution, Take 3 mLs (175 mcg total) by nebulization daily., Disp: 90 mL, Rfl: 6   rOPINIRole (REQUIP) 0.5 MG tablet, TAKE 1 TABLET AT BEDTIME FOR RESTLESS LEG (Patient taking differently: Take 0.5 mg by mouth at bedtime. TAKE 1  TABLET AT BEDTIME FOR RESTLESS LEG), Disp: 90 tablet, Rfl: 0   rosuvastatin (CRESTOR) 20 MG tablet, Take 1 tablet (20 mg total) by mouth daily. (Patient taking differently: Take 20 mg by mouth at bedtime.), Disp: 90 tablet, Rfl: 3   Spacer/Aero-Hold Chamber Bags MISC, Please show patient how to use, Disp: 1 each, Rfl: 0   tirzepatide (MOUNJARO) 2.5 MG/0.5ML Pen, INJECT 2.5 MG SUBCUTANEOUSLY WEEKLY, Disp: 0.5 mL, Rfl: 1   tirzepatide (MOUNJARO) 5 MG/0.5ML Pen, Inject 5 mg into the skin once a week. (Patient not taking: Reported on 07/24/2022), Disp: 2 mL, Rfl: 0  Past Medical History: Past Medical History:  Diagnosis Date   Acute on chronic respiratory failure with hypoxia and hypercapnia ([place]) 11/05/2018   Allergy    ASCUS on Pap smear    had repeat x 2 normal with [name] Plan 2011   Coronary artery disease    Gout    Hyperlipidemia    Hypertension    Myocardial infarction [facility])    2002 95% proximal LAD stenosis with thrombus followed by 90% stenosis, the first diagonal 80% stenosis, circumflex 30% stenosis, circumflex obtuse marginal subbranch 70% stenosis, PDA 40% stenosis. She had a drug-eluting stent placement with a Pixel stent   Osteopenia    Solitary kidney     Tobacco Use: Social History   Tobacco Use  Smoking Status Former   Packs/day: 1.00   Years: 39.00   Total pack years: 39.00   Types: Cigarettes   Quit date: 11/04/2018   Years since quitting: 3.7  Smokeless Tobacco Never    Labs: Review Flowsheet  More data exists      Latest Ref Rng & Units 04/10/2021 01/29/2022 02/06/2022 04/15/2022 06/04/2022  Labs for ITP Cardiac and Pulmonary Rehab  Cholestrol 100 - 199 mg/dL 169  167  - - 172   LDL (calc) 0 - 99 mg/dL 96  99  - - 76   HDL-C >39 mg/dL 41.70  43  - - 59   Trlycerides 0 - 149 mg/dL 158.0  139  - - 229   Hemoglobin A1c 4.0 - 5.6 % 7.5  - - 6.4  -  PH, Arterial 7.35 - 7.45 - - 7.47  - -  PCO2 arterial 32 - 48 mmHg - - 57  - -  Bicarbonate 20.0 - 28.0 mmol/L  - - 41.5  - -  O2 Saturation % - - 94.3  - -    Capillary Blood Glucose: Lab Results  Component Value Date   GLUCAP 124 (H) 05/30/2022   GLUCAP 313 (H) 05/29/2022   GLUCAP 410 (H) 05/29/2022   GLUCAP 210 (H) 05/29/2022   GLUCAP 141 (H) 05/29/2022    POCT Glucose     Row Name 07/24/22 1044  POCT Blood Glucose   Pre-Exercise 98 mg/dL                Pulmonary Assessment Scores:  Pulmonary Assessment Scores     Row Name 07/24/22 1055         ADL UCSD   SOB Score total 81       CAT Score   CAT Score 18       mMRC Score   mMRC Score 4             UCSD: Self-administered rating of dyspnea associated with activities of daily living (ADLs) 6-point scale (0 = "not at all" to 5 = "maximal or unable to do because of breathlessness")  Scoring Scores range from 0 to 120.  Minimally important difference is 5 units  CAT: CAT can identify the health impairment of COPD patients and is better correlated with disease progression.  CAT has a scoring range of zero to 40. The CAT score is classified into four groups of low (less than 10), medium (10 - 20), high (21-30) and very high (31-40) based on the impact level of disease on health status. A CAT score over 10 suggests significant symptoms.  A worsening CAT score could be explained by an exacerbation, poor medication adherence, poor inhaler technique, or progression of COPD or comorbid conditions.  CAT MCID is 2 points  mMRC: mMRC (Modified Medical Research Council) Dyspnea Scale is used to assess the degree of baseline functional disability in patients of respiratory disease due to dyspnea. No minimal important difference is established. A decrease in score of 1 point or greater is considered a positive change.   Pulmonary Function Assessment:  Pulmonary Function Assessment - 07/24/22 1055       Breath   Bilateral Breath Sounds Decreased;Expiratory;Wheezes    Shortness of Breath Yes;Fear of Shortness of  Breath;Panic with Shortness of Breath;Limiting activity             Exercise Target Goals: Exercise Program Goal: Individual exercise prescription set using results from initial 6 min walk test and THRR while considering  patient's activity barriers and safety.   Exercise Prescription Goal: Initial exercise prescription builds to 30-45 minutes a day of aerobic activity, 2-3 days per week.  Home exercise guidelines will be given to patient during program as part of exercise prescription that the participant will acknowledge.  Activity Barriers & Risk Stratification:  Activity Barriers & Cardiac Risk Stratification - 07/24/22 1232       Activity Barriers & Cardiac Risk Stratification   Activity Barriers Deconditioning;Muscular Weakness;Shortness of Breath;Assistive Device;Other (comment)    Comments balance issues             6 Minute Walk:  6 Minute Walk     Row Name 07/24/22 1220         6 Minute Walk   Distance 720 feet     Walk Time 6 minutes     # of Rest Breaks 0     MPH 1.36     METS 1.06     RPE 13     Perceived Dyspnea  2     VO2 Peak 3.72     Resting HR 95 bpm     Resting BP 102/56     Resting Oxygen Saturation  93 %     Exercise Oxygen Saturation  during 6 min walk 87 %     Max Ex. HR 109 bpm     Max Ex. BP 142/70  2 Minute Post BP 128/68       Interval HR   1 Minute HR 101     2 Minute HR 108     3 Minute HR 105     4 Minute HR 103     5 Minute HR 108     6 Minute HR 109     2 Minute Post HR 100     Interval Heart Rate? Yes       Interval Oxygen   Interval Oxygen? Yes     Baseline Oxygen Saturation % 93 %     1 Minute Oxygen Saturation % 94 %     1 Minute Liters of Oxygen 6 L     2 Minute Oxygen Saturation % 88 %  18mn 31sec, 87%     2 Minute Liters of Oxygen 6 L  247m 31sec, increased to 8L     3 Minute Oxygen Saturation % 91 %     3 Minute Liters of Oxygen 8 L     4 Minute Oxygen Saturation % 90 %     4 Minute Liters of Oxygen 8  L     5 Minute Oxygen Saturation % 90 %     5 Minute Liters of Oxygen 8 L     6 Minute Oxygen Saturation % 89 %     6 Minute Liters of Oxygen 8 L     2 Minute Post Oxygen Saturation % 97 %     2 Minute Post Liters of Oxygen 8 L              Oxygen Initial Assessment:  Oxygen Initial Assessment - 07/24/22 1053       Home Oxygen   Home Oxygen Device Home Concentrator;E-Tanks    Sleep Oxygen Prescription Continuous    Liters per minute 4    Home Exercise Oxygen Prescription Continuous    Liters per minute 6    Home Resting Oxygen Prescription Continuous    Liters per minute 4    Compliance with Home Oxygen Use Yes      Initial 6 min Walk   Oxygen Used Continuous             Oxygen Re-Evaluation:   Oxygen Discharge (Final Oxygen Re-Evaluation):   Initial Exercise Prescription:  Initial Exercise Prescription - 07/24/22 1200       Oxygen   Oxygen Continuous    Liters 8    Maintain Oxygen Saturation 88% or higher      NuStep   Level 1    SPM 50    Minutes 15      Track   Minutes 15      Prescription Details   Frequency (times per week) 2    Duration Progress to 30 minutes of continuous aerobic without signs/symptoms of physical distress      Intensity   THRR 40-80% of Max Heartrate 58-116    Ratings of Perceived Exertion 11-13    Perceived Dyspnea 0-4      Progression   Progression Continue to progress workloads to maintain intensity without signs/symptoms of physical distress.      Resistance Training   Training Prescription Yes    Weight red bands    Reps 10-15             Perform Capillary Blood Glucose checks as needed.  Exercise Prescription Changes:   Exercise Comments:   Exercise Goals and Review:   Exercise Goals  [place] Name 07/24/22 1234             Exercise Goals   Increase Physical Activity Yes       Intervention Develop an individualized exercise prescription for aerobic and resistive training based on  initial evaluation findings, risk stratification, comorbidities and participant's personal goals.;Provide advice, education, support and counseling about physical activity/exercise needs.       Expected Outcomes Long Term: Add in home exercise to make exercise part of routine and to increase amount of physical activity.;Short Term: Attend rehab on a regular basis to increase amount of physical activity.;Long Term: Exercising regularly at least 3-5 days a week.       Increase Strength and Stamina Yes       Intervention Provide advice, education, support and counseling about physical activity/exercise needs.;Develop an individualized exercise prescription for aerobic and resistive training based on initial evaluation findings, risk stratification, comorbidities and participant's personal goals.       Expected Outcomes Short Term: Increase workloads from initial exercise prescription for resistance, speed, and METs.;Short Term: Perform resistance training exercises routinely during rehab and add in resistance training at home;Long Term: Improve cardiorespiratory fitness, muscular endurance and strength as measured by increased METs and functional capacity (6MWT)       Able to understand and use rate of perceived exertion (RPE) scale Yes       Intervention Provide education and explanation on how to use RPE scale       Expected Outcomes Short Term: Able to use RPE daily in rehab to express subjective intensity level;Long Term:  Able to use RPE to guide intensity level when exercising independently       Able to understand and use Dyspnea scale Yes       Intervention Provide education and explanation on how to use Dyspnea scale       Expected Outcomes Short Term: Able to use Dyspnea scale daily in rehab to express subjective sense of shortness of breath during exertion;Long Term: Able to use Dyspnea scale to guide intensity level when exercising independently       Knowledge and understanding of Target Heart  Rate Range (THRR) Yes       Intervention Provide education and explanation of THRR including how the numbers were predicted and where they are located for reference       Expected Outcomes Short Term: Able to state/look up THRR;Short Term: Able to use daily as guideline for intensity in rehab;Long Term: Able to use THRR to govern intensity when exercising independently       Understanding of Exercise Prescription Yes       Intervention Provide education, explanation, and written materials on patient's individual exercise prescription       Expected Outcomes Short Term: Able to explain program exercise prescription;Long Term: Able to explain home exercise prescription to exercise independently                Exercise Goals Re-Evaluation :   Discharge Exercise Prescription (Final Exercise Prescription Changes):   Nutrition:  Target Goals: Understanding of nutrition guidelines, daily intake of sodium '1500mg'$ , cholesterol '200mg'$ , calories 30% from fat and 7% or less from saturated fats, daily to have 5 or more servings of fruits and vegetables.  Biometrics:    Nutrition Therapy Plan and Nutrition Goals:   Nutrition Assessments:  MEDIFICTS Score Key: ?70 Need to make dietary changes  40-70 Heart Healthy Diet ? 40 Therapeutic Level Cholesterol Diet   Picture Your Plate Scores: <  40 Unhealthy dietary pattern with much room for improvement. 41-50 Dietary pattern unlikely to meet recommendations for good health and room for improvement. 51-60 More healthful dietary pattern, with some room for improvement.  >60 Healthy dietary pattern, although there may be some specific behaviors that could be improved.    Nutrition Goals Re-Evaluation:   Nutrition Goals Discharge (Final Nutrition Goals Re-Evaluation):   Psychosocial: Target Goals: Acknowledge presence or absence of significant depression and/or stress, maximize coping skills, provide positive support system. Participant is  able to verbalize types and ability to use techniques and skills needed for reducing stress and depression.  Initial Review & Psychosocial Screening:  Initial Psych Review & Screening - 07/24/22 1056       Initial Review   Current issues with None Identified      Family Dynamics   Good Support System? Yes      Barriers   Psychosocial barriers to participate in program There are no identifiable barriers or psychosocial needs.      Screening Interventions   Interventions Encouraged to exercise             Quality of Life Scores:  Scores of 19 and below usually indicate a poorer quality of life in these areas.  A difference of  2-3 points is a clinically meaningful difference.  A difference of 2-3 points in the total score of the Quality of Life Index has been associated with significant improvement in overall quality of life, self-image, physical symptoms, and general health in studies assessing change in quality of life.  PHQ-9: Review Flowsheet  More data exists      07/24/2022 04/15/2022 04/03/2022 12/24/2021 05/22/2021  Depression screen PHQ 2/9  Decreased Interest 0 0 0 0 0  Down, Depressed, Hopeless 0 0 0 0 0  PHQ - 2 Score 0 0 0 0 0  Altered sleeping 0 0 - - 0  Tired, decreased energy 0 1 - - 0  Change in appetite 0 0 - - 0  Feeling bad or failure about yourself  0 0 - - 0  Trouble concentrating 0 0 - - 0  Moving slowly or fidgety/restless 0 0 - - 0  Suicidal thoughts 0 0 - - 0  PHQ-9 Score 0 1 - - 0  Difficult doing work/chores - Somewhat difficult - - -   Interpretation of Total Score  Total Score Depression Severity:  1-4 = Minimal depression, 5-9 = Mild depression, 10-14 = Moderate depression, 15-19 = Moderately severe depression, 20-27 = Severe depression   Psychosocial Evaluation and Intervention:  Psychosocial Evaluation - 07/24/22 1056       Psychosocial Evaluation & Interventions   Interventions Encouraged to exercise with the program and follow  exercise prescription    Comments Rebecca Mcintyre currently denies any psychosocial barriers or concerns    Expected Outcomes For Rebecca Mcintyre to participate in pulm rehab without any psychosocial barriers or concerns    Continue Psychosocial Services  No Follow up required             Psychosocial Re-Evaluation:   Psychosocial Discharge (Final Psychosocial Re-Evaluation):   Education: Education Goals: Education classes will be provided on a weekly basis, covering required topics. Participant will state understanding/return demonstration of topics presented.  Learning Barriers/Preferences:  Learning Barriers/Preferences - 07/24/22 1057       Learning Barriers/Preferences   Learning Barriers Hearing    Learning Preferences Individual Instruction;Written Material             Education  Topics: Introduction to Pulmonary Rehab Group instruction provided by PowerPoint, verbal discussion, and written material to support subject matter. Instructor reviews what Pulmonary Rehab is, the purpose of the program, and how patients are referred.     Know Your Numbers Group instruction that is supported by a PowerPoint presentation. Instructor discusses importance of knowing and understanding resting, exercise, and post-exercise oxygen saturation, heart rate, and blood pressure. Oxygen saturation, heart rate, blood pressure, rating of perceived exertion, and dyspnea are reviewed along with a normal range for these values.    Exercise for the Pulmonary Patient Group instruction that is supported by a PowerPoint presentation. Instructor discusses benefits of exercise, core components of exercise, frequency, duration, and intensity of an exercise routine, importance of utilizing pulse oximetry during exercise, safety while exercising, and options of places to exercise outside of rehab.    MET Level  Group instruction provided by PowerPoint, verbal discussion, and written material to support subject matter.  Instructor reviews what METs are and how to increase METs.    Pulmonary Medications Verbally interactive group education provided by instructor with focus on inhaled medications and proper administration.   Anatomy and Physiology of the Respiratory System Group instruction provided by PowerPoint, verbal discussion, and written material to support subject matter. Instructor reviews respiratory cycle and anatomical components of the respiratory system and their functions. Instructor also reviews differences in obstructive and restrictive respiratory diseases with examples of each.    Oxygen Safety Group instruction provided by PowerPoint, verbal discussion, and written material to support subject matter. There is an overview of "What is Oxygen" and "Why do we need it".  Instructor also reviews how to create a safe environment for oxygen use, the importance of using oxygen as prescribed, and the risks of noncompliance. There is a brief discussion on traveling with oxygen and resources the patient may utilize.   Oxygen Use Group instruction provided by PowerPoint, verbal discussion, and written material to discuss how supplemental oxygen is prescribed and different types of oxygen supply systems. Resources for more information are provided.    Breathing Techniques Group instruction that is supported by demonstration and informational handouts. Instructor discusses the benefits of pursed lip and diaphragmatic breathing and detailed demonstration on how to perform both.     Risk Factor Reduction Group instruction that is supported by a PowerPoint presentation. Instructor discusses the definition of a risk factor, different risk factors for pulmonary disease, and how the heart and lungs work together.   MD Day A group question and answer session with a medical doctor that allows participants to ask questions that relate to their pulmonary disease state.   Nutrition for the Pulmonary  Patient Group instruction provided by PowerPoint slides, verbal discussion, and written materials to support subject matter. The instructor gives an explanation and review of healthy diet recommendations, which includes a discussion on weight management, recommendations for fruit and vegetable consumption, as well as protein, fluid, caffeine, fiber, sodium, sugar, and alcohol. Tips for eating when patients are short of breath are discussed.    Other Education Group or individual verbal, written, or video instructions that support the educational goals of the pulmonary rehab program.    Knowledge Questionnaire Score:   Core Components/Risk Factors/Patient Goals at Admission:  Personal Goals and Risk Factors at Admission - 07/24/22 1057       Core Components/Risk Factors/Patient Goals on Admission    Weight Management Weight Loss    Improve shortness of breath with ADL's Yes  Intervention Provide education, individualized exercise plan and daily activity instruction to help decrease symptoms of SOB with activities of daily living.    Expected Outcomes Short Term: Improve cardiorespiratory fitness to achieve a reduction of symptoms when performing ADLs;Long Term: Be able to perform more ADLs without symptoms or delay the onset of symptoms    Increase knowledge of respiratory medications and ability to use respiratory devices properly  Yes    Intervention Provide education and demonstration as needed of appropriate use of medications, inhalers, and oxygen therapy.    Expected Outcomes Short Term: Achieves understanding of medications use. Understands that oxygen is a medication prescribed by physician. Demonstrates appropriate use of inhaler and oxygen therapy.;Long Term: Maintain appropriate use of medications, inhalers, and oxygen therapy.             Core Components/Risk Factors/Patient Goals Review:    Core Components/Risk Factors/Patient Goals at Discharge (Final Review):     ITP Comments:   Comments: Dr. Rodman Pickle is Medical Director for Pulmonary Rehab at [facility].

## 2022-07-24 NOTE — Progress Notes (Signed)
Rebecca Mcintyre 77 y.o. female  Pulmonary Rehab Orientation Note  This patient who was referred to Pulmonary Rehab by Dr. [name] with the diagnosis of COPD II arrived today in Cardiac and Pulmonary Rehab. She  arrived ambulatory with assistive device with normal gait. She  does carry portable oxygen. [employer] is the provider for their DME. Per patient, Rebecca Mcintyre uses oxygen continuously. Color good, skin warm and dry. Patient is oriented to time and place.   Patient's medical history, psychosocial health, and medications reviewed. Psychosocial assessment reveals patient lives with alone. Rebecca Mcintyre is currently retired. Patient hobbies include  social media, watching movies, reading and she enjoys the company of her 2 cats . Patient reports her stress level is low. Patient does not exhibit signs of depression. PHQ2/9 score 0/0. Rebecca Mcintyre shows good  coping skills with positive outlook on life. We will continue to monitor and evaluate any psychosocial barriers that may arise.                                                          Physical assessment reveals patient is alert and oriented x 4.  Heart rate is normal, breath sounds diminished to auscultation, no wheezes, rales, or rhonchi, air entry reduced in the RUL and LUL. Pt reports non-productive cough. Bowel sounds present in all 4 quadrants.  Pt denies abdominal discomfort, nausea, vomiting or diarrhea. Grip strength equal, strong. Distal pulses +2; +1 bilateral swelling to lower extremities.   Rebecca Mcintyre reports she does take medications as prescribed. Patient states she follows a low fat  diet. The patient has been trying to lose weight through a healthy diet and exercise program.. Pt's weight will be monitored closely. Demonstration and practice of PLB using pulse oximeter. Rebecca Mcintyre is able to return demonstration satisfactorily. Safety and hand hygiene in the exercise area reviewed with patient. Rebecca Mcintyre voices understanding of the information reviewed. Department expectations discussed  with patient and achievable goals were set. The patient shows enthusiasm about attending the program and we look forward to working with Rebecca Mcintyre. Rebecca Mcintyre completed a 6 min walk test today and is scheduled to begin exercise on 07/30/2022 at 1015.   [id number] Rebecca Ores, RN, BSN

## 2022-07-25 ENCOUNTER — Telehealth: Payer: Self-pay | Admitting: Pulmonary Disease

## 2022-07-25 DIAGNOSIS — J9611 Chronic respiratory failure with hypoxia: Secondary | ICD-10-CM

## 2022-07-25 DIAGNOSIS — J432 Centrilobular emphysema: Secondary | ICD-10-CM

## 2022-07-25 NOTE — Telephone Encounter (Signed)
Order for oximyzer has been placed. Nothing further needed.

## 2022-07-25 NOTE — Telephone Encounter (Signed)
OK to send order for oximizer

## 2022-07-25 NOTE — Telephone Encounter (Signed)
-----   Message from Vilinda Blanks, RRT sent at 07/24/2022 12:30 PM EST ----- Regarding: O2 increase and oxymizer request Dr [name], Rebecca Mcintyre came in for PR orientation today and during her 63mn walk test she required an increase in her O2 from 6-8L.  Could you put in an order to her DME for an oxymizer? Her DME company is [employer].  Thanks, CMyriam Jacobson

## 2022-07-26 ENCOUNTER — Other Ambulatory Visit: Payer: Self-pay | Admitting: Internal Medicine

## 2022-07-26 DIAGNOSIS — J9611 Chronic respiratory failure with hypoxia: Secondary | ICD-10-CM | POA: Diagnosis not present

## 2022-07-26 NOTE — Telephone Encounter (Signed)
Order placed already.

## 2022-07-27 DIAGNOSIS — J9611 Chronic respiratory failure with hypoxia: Secondary | ICD-10-CM | POA: Diagnosis not present

## 2022-07-28 DIAGNOSIS — J449 Chronic obstructive pulmonary disease, unspecified: Secondary | ICD-10-CM | POA: Diagnosis not present

## 2022-07-28 DIAGNOSIS — J9611 Chronic respiratory failure with hypoxia: Secondary | ICD-10-CM | POA: Diagnosis not present

## 2022-07-28 DIAGNOSIS — J9612 Chronic respiratory failure with hypercapnia: Secondary | ICD-10-CM | POA: Diagnosis not present

## 2022-07-30 ENCOUNTER — Encounter (HOSPITAL_COMMUNITY)
Admission: RE | Admit: 2022-07-30 | Discharge: 2022-07-30 | Disposition: A | Discharge status: Home / Self Care | Payer: Medicare Other | Source: Ambulatory Visit | Attending: Pulmonary Disease | Admitting: Pulmonary Disease

## 2022-07-30 DIAGNOSIS — Z5189 Encounter for other specified aftercare: Secondary | ICD-10-CM | POA: Diagnosis not present

## 2022-07-30 DIAGNOSIS — J449 Chronic obstructive pulmonary disease, unspecified: Secondary | ICD-10-CM | POA: Diagnosis not present

## 2022-07-30 LAB — GLUCOSE, CAPILLARY
Glucose-Capillary: 105 mg/dL — ABNORMAL HIGH (ref 70–99)
Glucose-Capillary: 114 mg/dL — ABNORMAL HIGH (ref 70–99)

## 2022-07-30 NOTE — Progress Notes (Signed)
Daily Session Note  Patient Details  Name: Rebecca Mcintyre MRN: [id number] Date of Birth: 01-28-1946 Referring Provider:   April Mcintyre Pulmonary Rehab Walk Test from 07/24/2022 in [facility] [place]  Referring Provider Rebecca Mcintyre       Encounter Date: 07/30/2022  Check In:  Session Check In - 07/30/22 1207       Check-In   Supervising physician immediately available to respond to emergencies [facility] MD immediately available    Physician(s) Dr Rebecca Mcintyre    Location [facility]    Staff Present Rebecca Ores, RN, Rebecca Ore, MS, ACSM-CEP, Exercise Physiologist;Rebecca Mcintyre, ACSM-CEP, Exercise Physiologist;Rebecca Mcintyre, RD, LDN;Other    Virtual Visit No    Medication changes reported     No    Comments Orientation day, meds reviewed    Fall or balance concerns reported    No    Tobacco Cessation No Change    Warm-up and Cool-down Performed as group-led instruction   Pt here for orientation   Resistance Training Performed Yes    VAD Patient? No    PAD/SET Patient? No      Pain Assessment   Currently in Pain? No/denies    Multiple Pain Sites No             Capillary Blood Glucose: Results for orders placed or performed during the hospital encounter of 07/30/22 (from the past 24 hour(s))  Glucose, capillary     Status: Abnormal   Collection Time: 07/30/22 11:45 AM  Result Value Ref Range   Glucose-Capillary 114 (H) 70 - 99 mg/dL      Social History   Tobacco Use  Smoking Status Former   Packs/day: 1.00   Years: 39.00   Total pack years: 39.00   Types: Cigarettes   Quit date: 11/04/2018   Years since quitting: 3.7  Smokeless Tobacco Never    Goals Met:  Exercise tolerated well No report of concerns or symptoms today Strength training completed today  Goals Unmet:  Not Applicable  Comments: Service time is from 1017 to 1151.    Dr. Rodman Mcintyre is Medical Director for Pulmonary Rehab at  [facility].

## 2022-08-01 ENCOUNTER — Encounter (HOSPITAL_COMMUNITY)
Admission: RE | Admit: 2022-08-01 | Discharge: 2022-08-01 | Disposition: A | Discharge status: Home / Self Care | Payer: Medicare Other | Source: Ambulatory Visit | Attending: Pulmonary Disease | Admitting: Pulmonary Disease

## 2022-08-01 VITALS — Wt 228.0 lb

## 2022-08-01 DIAGNOSIS — J449 Chronic obstructive pulmonary disease, unspecified: Secondary | ICD-10-CM | POA: Diagnosis not present

## 2022-08-01 DIAGNOSIS — Z5189 Encounter for other specified aftercare: Secondary | ICD-10-CM | POA: Diagnosis not present

## 2022-08-01 LAB — GLUCOSE, CAPILLARY
Glucose-Capillary: 93 mg/dL (ref 70–99)
Glucose-Capillary: 115 mg/dL — ABNORMAL HIGH (ref 70–99)

## 2022-08-01 NOTE — Progress Notes (Signed)
Daily Session Note  Patient Details  Name: Rebecca Mcintyre MRN: [id number] Date of Birth: 1945-09-01 Referring Provider:   April Manson Pulmonary Rehab Walk Test from 07/24/2022 in [facility]  Referring Provider [name]       Encounter Date: 08/01/2022  Check In:  Session Check In - 08/01/22 1156       Check-In   Supervising physician immediately available to respond to emergencies [facility] MD immediately available    Physician(s) Dr Vaughan Browner    Location [facility]    Staff Present Janine Ores, RN, Quentin Ore, MS, ACSM-CEP, Exercise Physiologist;Randi Yevonne Pax, ACSM-CEP, Exercise Physiologist;Samantha [place], RD, LDN;Other    Virtual Visit No    Medication changes reported     No    Fall or balance concerns reported    No    Tobacco Cessation No Change    Warm-up and Cool-down Performed as group-led instruction   Pt here for orientation   Resistance Training Performed Yes    VAD Patient? No    PAD/SET Patient? No      Pain Assessment   Currently in Pain? No/denies    Multiple Pain Sites No             Capillary Blood Glucose: Results for orders placed or performed during the hospital encounter of 08/01/22 (from the past 24 hour(s))  Glucose, capillary     Status: Abnormal   Collection Time: 08/01/22 11:42 AM  Result Value Ref Range   Glucose-Capillary 115 (H) 70 - 99 mg/dL     Exercise Prescription Changes - 08/01/22 1200       Response to Exercise   Blood Pressure (Admit) 120/70    Blood Pressure (Exercise) 142/70    Blood Pressure (Exit) 106/56    Heart Rate (Admit) 87 bpm    Heart Rate (Exercise) 101 bpm    Heart Rate (Exit) 93 bpm    Oxygen Saturation (Admit) 97 %    Oxygen Saturation (Exercise) 88 %    Oxygen Saturation (Exit) 94 %    Rating of Perceived Exertion (Exercise) 17    Perceived Dyspnea (Exercise) 4    Duration Continue with 30 min of aerobic exercise without  signs/symptoms of physical distress.    Intensity THRR unchanged      Resistance Training   Training Prescription Yes    Weight red bands    Reps 10-15    Time 10 Minutes      Oxygen   Oxygen Continuous    Liters 8      NuStep   Level 1    Minutes 15    METs 1.3      Track   Laps 4    Minutes 15    METs 1.62      Oxygen   Maintain Oxygen Saturation 88% or higher             Social History   Tobacco Use  Smoking Status Former   Packs/day: 1.00   Years: 39.00   Total pack years: 39.00   Types: Cigarettes   Quit date: 11/04/2018   Years since quitting: 3.7  Smokeless Tobacco Never    Goals Met:  Exercise tolerated well No report of concerns or symptoms today Strength training completed today  Goals Unmet:  Not Applicable  Comments: Service time is from 1019 to 1145    Dr. Rodman Pickle is Medical Director for Pulmonary Rehab at  [facility].

## 2022-08-06 ENCOUNTER — Encounter (HOSPITAL_COMMUNITY)
Admission: RE | Admit: 2022-08-06 | Discharge: 2022-08-06 | Disposition: A | Discharge status: Home / Self Care | Payer: Medicare Other | Source: Ambulatory Visit | Attending: Pulmonary Disease | Admitting: Pulmonary Disease

## 2022-08-06 DIAGNOSIS — J449 Chronic obstructive pulmonary disease, unspecified: Secondary | ICD-10-CM

## 2022-08-06 DIAGNOSIS — Z5189 Encounter for other specified aftercare: Secondary | ICD-10-CM | POA: Diagnosis not present

## 2022-08-06 NOTE — Progress Notes (Signed)
Daily Session Note  Patient Details  Name: Rebecca Mcintyre MRN: [id number] Date of Birth: Jul 30, 1945 Referring Provider:   April Manson Pulmonary Rehab Walk Test from 07/24/2022 in [facility] [place]  Referring Provider [name]       Encounter Date: 08/06/2022  Check In:  Session Check In - 08/06/22 1150       Check-In   Supervising physician immediately available to respond to emergencies [facility] - Physician supervision    Physician(s) Barbaraann Barthel, NP    Location [facility]    Staff Present Janine Ores, RN, Quentin Ore, MS, ACSM-CEP, Exercise Physiologist;Randi Yevonne Pax, ACSM-CEP, Exercise Physiologist;Samantha [place], RD, LDN;Other    Virtual Visit No    Medication changes reported     No    Fall or balance concerns reported    No    Tobacco Cessation No Change    Warm-up and Cool-down Performed as group-led instruction   Pt here for orientation   Resistance Training Performed Yes    VAD Patient? No    PAD/SET Patient? No      Pain Assessment   Currently in Pain? No/denies    Multiple Pain Sites No             Capillary Blood Glucose: No results found for this or any previous visit (from the past 24 hour(s)).    Social History   Tobacco Use  Smoking Status Former   Packs/day: 1.00   Years: 39.00   Total pack years: 39.00   Types: Cigarettes   Quit date: 11/04/2018   Years since quitting: 3.7  Smokeless Tobacco Never    Goals Met:  Proper associated with RPD/PD & O2 Sat [place] with exercise equipment Exercise tolerated well No report of concerns or symptoms today Strength training completed today  Goals Unmet:  Not Applicable  Comments: Service time is from 1018 to 1149.    Dr. Rodman Pickle is Medical Director for Pulmonary Rehab at [facility].

## 2022-08-07 NOTE — Progress Notes (Signed)
Pulmonary Individual Treatment Plan  Patient Details  Name: Rebecca Mcintyre MRN: [id number] Date of Birth: February 23, 1946 Referring Provider:   April Manson Pulmonary Rehab Walk Test from 07/24/2022 in [facility] for Heart, V[facility], [facility] [place]  Referring Provider [name]       Initial Encounter Date:  [place] [facility] Walk Test from 07/24/2022 in [facility]  Date 07/24/22       Visit Diagnosis: Stage 2 moderate COPD by GOLD classification ([place])  Patient's Home Medications on Admission:   Current Outpatient Medications:    ACCU-CHEK GUIDE test strip, USE AS DIRECTED TWICE A DAY, Disp: 100 strip, Rfl: 3   Accu-Chek Softclix Lancets lancets, Use as instructed twice a day Diagnosis E11.9, Disp: 100 each, Rfl: 3   acetaminophen (TYLENOL) 500 MG tablet, Take 1,000 mg by mouth every 6 (six) hours as needed for mild pain., Disp: , Rfl:    albuterol (PROVENTIL) (2.5 MG/3ML) 0.083% nebulizer solution, Take 3 mLs (2.5 mg total) by nebulization every 6 (six) hours as needed for wheezing or shortness of breath., Disp: 300 mL, Rfl: 11   albuterol (VENTOLIN HFA) 108 (90 Base) MCG/ACT inhaler, USE 2 PUFFS EVERY 6 HOURS AS NEEDED FOR WHEEZE OR SHORTNESS OF BREATH (Patient taking differently: Inhale 2 puffs into the lungs every 6 (six) hours as needed for wheezing or shortness of breath.), Disp: 8.5 each, Rfl: 5   allopurinol (ZYLOPRIM) 100 MG tablet, TAKE 1 TABLET BY MOUTH TWICE A DAY, Disp: 180 tablet, Rfl: 1   arformoterol (BROVANA) 15 MCG/2ML NEBU, Take 2 mLs (15 mcg total) by nebulization 2 (two) times daily., Disp: 120 mL, Rfl: 11   aspirin 81 MG tablet, Take 81 mg by mouth daily., Disp: , Rfl:    Blood Glucose Monitoring Suppl (ACCU-CHEK GUIDE) w/Device KIT, Use as directed twice a day Diagnosis E11.9, Disp: 1 kit, Rfl: 2   budesonide (PULMICORT) 0.5 MG/2ML nebulizer solution, Take 2 mLs (0.5 mg total) by  nebulization 2 (two) times daily., Disp: 120 mL, Rfl: 12   calcium carbonate (TUMS EX) 750 MG chewable tablet, Chew 750 mg by mouth daily as needed for heartburn., Disp: , Rfl:    dapagliflozin propanediol (FARXIGA) 10 MG TABS tablet, Take 1 tablet (10 mg total) by mouth daily., Disp: 30 tablet, Rfl: 3   furosemide (LASIX) 40 MG tablet, TAKE 1 TABLET BY MOUTH EVERY DAY, Disp: 90 tablet, Rfl: 0   guaiFENesin (MUCINEX) 600 MG 12 hr tablet, Take 2 tablets (1,200 mg total) by mouth 2 (two) times daily., Disp: 60 tablet, Rfl: 0   KLOR-CON M10 10 MEQ tablet, TAKE 1 TABLET BY MOUTH TWICE A DAY, Disp: 180 tablet, Rfl: 0   metFORMIN (GLUCOPHAGE) 1000 MG tablet, Take 1 tablet (1,000 mg total) by mouth 2 (two) times daily with a meal., Disp: 60 tablet, Rfl: 3   metoprolol tartrate (LOPRESSOR) 25 MG tablet, TAKE 1/2 TABLETS BY MOUTH 2 TIMES DAILY, Disp: 90 tablet, Rfl: 0   nitroGLYCERIN (NITROSTAT) 0.4 MG SL tablet, PLACE 1 TABLET UNDER THE TONGUE EVERY 5 MINUTES AS NEEDED. (Patient taking differently: Place 0.4 mg under the tongue every 5 (five) minutes as needed for chest pain.), Disp: 25 tablet, Rfl: 1   pantoprazole (PROTONIX) 40 MG tablet, Take 1 tablet (40 mg total) by mouth daily., Disp: 30 tablet, Rfl: 3   Polyethylene Glycol 400 (VISINE DRY EYE RELIEF OP), Place 1 drop into both eyes  daily as needed (dry eyes)., Disp: , Rfl:    predniSONE (DELTASONE) 10 MG tablet, Prednisone dosing: Take  Prednisone '40mg'$  (4 tabs) x 2 days, then taper to '30mg'$  (3 tabs) x 3 days, then '20mg'$  (2 tabs) x 3days, then '10mg'$  (1 tab) x 3days, then OFF., Disp: 26 tablet, Rfl: 0   revefenacin (YUPELRI) 175 MCG/3ML nebulizer solution, Take 3 mLs (175 mcg total) by nebulization daily., Disp: 90 mL, Rfl: 6   rOPINIRole (REQUIP) 0.5 MG tablet, TAKE 1 TABLET AT BEDTIME FOR RESTLESS LEG (Patient taking differently: Take 0.5 mg by mouth at bedtime. TAKE 1 TABLET AT BEDTIME FOR RESTLESS LEG), Disp: 90 tablet, Rfl: 0   rosuvastatin (CRESTOR) 20  MG tablet, Take 1 tablet (20 mg total) by mouth daily. (Patient taking differently: Take 20 mg by mouth at bedtime.), Disp: 90 tablet, Rfl: 3   Spacer/Aero-Hold Chamber Bags MISC, Please show patient how to use, Disp: 1 each, Rfl: 0   tirzepatide (MOUNJARO) 2.5 MG/0.5ML Pen, INJECT 2.5 MG SUBCUTANEOUSLY WEEKLY, Disp: 0.5 mL, Rfl: 1   tirzepatide (MOUNJARO) 5 MG/0.5ML Pen, Inject 5 mg into the skin once a week. (Patient not taking: Reported on 07/24/2022), Disp: 2 mL, Rfl: 0  Past Medical History: Past Medical History:  Diagnosis Date   Acute on chronic respiratory failure with hypoxia and hypercapnia ([place]) 11/05/2018   Allergy    ASCUS on Pap smear    had repeat x 2 normal with [name] Plan 2011   Coronary artery disease    Gout    Hyperlipidemia    Hypertension    Myocardial infarction [facility] [place])    2002 95% proximal LAD stenosis with thrombus followed by 90% stenosis, the first diagonal 80% stenosis, circumflex 30% stenosis, circumflex obtuse marginal subbranch 70% stenosis, PDA 40% stenosis. She had a drug-eluting stent placement with a Pixel stent   Osteopenia    Solitary kidney     Tobacco Use: Social History   Tobacco Use  Smoking Status Former   Packs/day: 1.00   Years: 39.00   Total pack years: 39.00   Types: Cigarettes   Quit date: 11/04/2018   Years since quitting: 3.7  Smokeless Tobacco Never    Labs: Review Flowsheet  More data exists      Latest Ref Rng & Units 04/10/2021 01/29/2022 02/06/2022 04/15/2022 06/04/2022  Labs for ITP Cardiac and Pulmonary Rehab  Cholestrol 100 - 199 mg/dL 169  167  - - 172   LDL (calc) 0 - 99 mg/dL 96  99  - - 76   HDL-C >39 mg/dL 41.70  43  - - 59   Trlycerides 0 - 149 mg/dL 158.0  139  - - 229   Hemoglobin A1c 4.0 - 5.6 % 7.5  - - 6.4  -  PH, Arterial 7.35 - 7.45 - - 7.47  - -  PCO2 arterial 32 - 48 mmHg - - 57  - -  Bicarbonate 20.0 - 28.0 mmol/L - - 41.5  - -  O2 Saturation % - - 94.3  - -    Capillary Blood Glucose: Lab Results   Component Value Date   GLUCAP 115 (H) 08/01/2022   GLUCAP 93 08/01/2022   GLUCAP 114 (H) 07/30/2022   GLUCAP 105 (H) 07/30/2022   GLUCAP 124 (H) 05/30/2022    POCT Glucose     Row Name 07/24/22 1044             POCT Blood Glucose   Pre-Exercise 98 mg/dL  Pulmonary Assessment Scores:  Pulmonary Assessment Scores     Row Name 07/24/22 1055         ADL UCSD   SOB Score total 81       CAT Score   CAT Score 18       mMRC Score   mMRC Score 4             UCSD: Self-administered rating of dyspnea associated with activities of daily living (ADLs) 6-point scale (0 = "not at all" to 5 = "maximal or unable to do because of breathlessness")  Scoring Scores range from 0 to 120.  Minimally important difference is 5 units  CAT: CAT can identify the health impairment of COPD patients and is better correlated with disease progression.  CAT has a scoring range of zero to 40. The CAT score is classified into four groups of low (less than 10), medium (10 - 20), high (21-30) and very high (31-40) based on the impact level of disease on health status. A CAT score over 10 suggests significant symptoms.  A worsening CAT score could be explained by an exacerbation, poor medication adherence, poor inhaler technique, or progression of COPD or comorbid conditions.  CAT MCID is 2 points  mMRC: mMRC (Modified Medical Research Council) Dyspnea Scale is used to assess the degree of baseline functional disability in patients of respiratory disease due to dyspnea. No minimal important difference is established. A decrease in score of 1 point or greater is considered a positive change.   Pulmonary Function Assessment:  Pulmonary Function Assessment - 07/24/22 1055       Breath   Bilateral Breath Sounds Decreased;Expiratory;Wheezes    Shortness of Breath Yes;Fear of Shortness of Breath;Panic with Shortness of Breath;Limiting activity             Exercise Target  Goals: Exercise Program Goal: Individual exercise prescription set using results from initial 6 min walk test and THRR while considering  patient's activity barriers and safety.   Exercise Prescription Goal: Initial exercise prescription builds to 30-45 minutes a day of aerobic activity, 2-3 days per week.  Home exercise guidelines will be given to patient during program as part of exercise prescription that the participant will acknowledge.  Activity Barriers & Risk Stratification:  Activity Barriers & Cardiac Risk Stratification - 07/24/22 1232       Activity Barriers & Cardiac Risk Stratification   Activity Barriers Deconditioning;Muscular Weakness;Shortness of Breath;Assistive Device;Other (comment)    Comments balance issues             6 Minute Walk:  6 Minute Walk     Row Name 07/24/22 1220         6 Minute Walk   Phase Initial     Distance 720 feet     Walk Time 6 minutes     # of Rest Breaks 0     MPH 1.36     METS 1.06     RPE 13     Perceived Dyspnea  2     VO2 Peak 3.72     Resting HR 95 bpm     Resting BP 102/56     Resting Oxygen Saturation  93 %     Exercise Oxygen Saturation  during 6 min walk 87 %     Max Ex. HR 109 bpm     Max Ex. BP 142/70     2 Minute Post BP 128/68       Interval HR  1 Minute HR 101     2 Minute HR 108     3 Minute HR 105     4 Minute HR 103     5 Minute HR 108     6 Minute HR 109     2 Minute Post HR 100     Interval Heart Rate? Yes       Interval Oxygen   Interval Oxygen? Yes     Baseline Oxygen Saturation % 93 %     1 Minute Oxygen Saturation % 94 %     1 Minute Liters of Oxygen 6 L     2 Minute Oxygen Saturation % 88 %  39mn 31sec, 87%     2 Minute Liters of Oxygen 6 L  283m 31sec, increased to 8L     3 Minute Oxygen Saturation % 91 %     3 Minute Liters of Oxygen 8 L     4 Minute Oxygen Saturation % 90 %     4 Minute Liters of Oxygen 8 L     5 Minute Oxygen Saturation % 90 %     5 Minute Liters of Oxygen 8  L     6 Minute Oxygen Saturation % 89 %     6 Minute Liters of Oxygen 8 L     2 Minute Post Oxygen Saturation % 97 %     2 Minute Post Liters of Oxygen 8 L              Oxygen Initial Assessment:  Oxygen Initial Assessment - 07/24/22 1053       Home Oxygen   Home Oxygen Device Home Concentrator;E-Tanks    Sleep Oxygen Prescription Continuous    Liters per minute 4    Home Exercise Oxygen Prescription Continuous    Liters per minute 6    Home Resting Oxygen Prescription Continuous    Liters per minute 4    Compliance with Home Oxygen Use Yes      Initial 6 min Walk   Oxygen Used Continuous    Liters per minute 8      Program Oxygen Prescription   Program Oxygen Prescription Continuous    Liters per minute 8      Intervention   Short Term Goals To learn and exhibit compliance with exercise, home and travel O2 prescription;To learn and understand importance of monitoring SPO2 with pulse oximeter and demonstrate accurate use of the pulse oximeter.;To learn and understand importance of maintaining oxygen saturations>88%;To learn and demonstrate proper pursed lip breathing techniques or other breathing techniques. ;To learn and demonstrate proper use of respiratory medications    Long  Term Goals Exhibits compliance with exercise, home  and travel O2 prescription;Verbalizes importance of monitoring SPO2 with pulse oximeter and return demonstration;Maintenance of O2 saturations>88%;Exhibits proper breathing techniques, such as pursed lip breathing or other method taught during program session;Compliance with respiratory medication             Oxygen Re-Evaluation:  Oxygen Re-Evaluation     Row Name 08/02/22 0819             Program Oxygen Prescription   Program Oxygen Prescription Continuous       Liters per minute 8         Home Oxygen   Home Oxygen Device Home Concentrator;E-Tanks       Sleep Oxygen Prescription Continuous       Liters per minute 4  Home  Exercise Oxygen Prescription Continuous       Liters per minute 6       Home Resting Oxygen Prescription Continuous       Liters per minute 4       Compliance with Home Oxygen Use Yes         Goals/Expected Outcomes   Short Term Goals To learn and exhibit compliance with exercise, home and travel O2 prescription;To learn and understand importance of monitoring SPO2 with pulse oximeter and demonstrate accurate use of the pulse oximeter.;To learn and understand importance of maintaining oxygen saturations>88%;To learn and demonstrate proper pursed lip breathing techniques or other breathing techniques. ;To learn and demonstrate proper use of respiratory medications       Long  Term Goals Exhibits compliance with exercise, home  and travel O2 prescription;Verbalizes importance of monitoring SPO2 with pulse oximeter and return demonstration;Maintenance of O2 saturations>88%;Exhibits proper breathing techniques, such as pursed lip breathing or other method taught during program session;Compliance with respiratory medication       Goals/Expected Outcomes Compliance and understanding of oxygen saturation monitoring and breathing techniques to decrease shortness of breath.                Oxygen Discharge (Final Oxygen Re-Evaluation):  Oxygen Re-Evaluation - 08/02/22 0819       Program Oxygen Prescription   Program Oxygen Prescription Continuous    Liters per minute 8      Home Oxygen   Home Oxygen Device Home Concentrator;E-Tanks    Sleep Oxygen Prescription Continuous    Liters per minute 4    Home Exercise Oxygen Prescription Continuous    Liters per minute 6    Home Resting Oxygen Prescription Continuous    Liters per minute 4    Compliance with Home Oxygen Use Yes      Goals/Expected Outcomes   Short Term Goals To learn and exhibit compliance with exercise, home and travel O2 prescription;To learn and understand importance of monitoring SPO2 with pulse oximeter and demonstrate  accurate use of the pulse oximeter.;To learn and understand importance of maintaining oxygen saturations>88%;To learn and demonstrate proper pursed lip breathing techniques or other breathing techniques. ;To learn and demonstrate proper use of respiratory medications    Long  Term Goals Exhibits compliance with exercise, home  and travel O2 prescription;Verbalizes importance of monitoring SPO2 with pulse oximeter and return demonstration;Maintenance of O2 saturations>88%;Exhibits proper breathing techniques, such as pursed lip breathing or other method taught during program session;Compliance with respiratory medication    Goals/Expected Outcomes Compliance and understanding of oxygen saturation monitoring and breathing techniques to decrease shortness of breath.             Initial Exercise Prescription:  Initial Exercise Prescription - 07/24/22 1200       Date of Initial Exercise RX and Referring Provider   Date 07/24/22    Referring Provider [facility]    Expected Discharge Date 10/17/22      Oxygen   Oxygen Continuous    Liters 8    Maintain Oxygen Saturation 88% or higher      NuStep   Level --    SPM --    Minutes --      Recumbant Elliptical   Level 1    Watts 20    Minutes 15    METs 2      Track   Minutes 15      Prescription Details   Frequency (times per week) 2  Duration Progress to 30 minutes of continuous aerobic without signs/symptoms of physical distress      Intensity   THRR 40-80% of Max Heartrate 58-116    Ratings of Perceived Exertion 11-13    Perceived Dyspnea 0-4      Progression   Progression Continue to progress workloads to maintain intensity without signs/symptoms of physical distress.      Resistance Training   Training Prescription Yes    Weight red bands    Reps 10-15             Perform Capillary Blood Glucose checks as needed.  Exercise Prescription Changes:   Exercise Prescription Changes     Row Name 08/01/22 1200              Response to Exercise   Blood Pressure (Admit) 120/70       Blood Pressure (Exercise) 142/70       Blood Pressure (Exit) 106/56       Heart Rate (Admit) 87 bpm       Heart Rate (Exercise) 101 bpm       Heart Rate (Exit) 93 bpm       Oxygen Saturation (Admit) 97 %       Oxygen Saturation (Exercise) 88 %       Oxygen Saturation (Exit) 94 %       Rating of Perceived Exertion (Exercise) 17       Perceived Dyspnea (Exercise) 4       Duration Continue with 30 min of aerobic exercise without signs/symptoms of physical distress.       Intensity THRR unchanged         Resistance Training   Training Prescription Yes       Weight red bands       Reps 10-15       Time 10 Minutes         Oxygen   Oxygen Continuous       Liters 8         NuStep   Level 1       Minutes 15       METs 1.3         Track   Laps 4       Minutes 15       METs 1.62         Oxygen   Maintain Oxygen Saturation 88% or higher                Exercise Comments:   Exercise Comments     Row Name 07/30/22 1215           Exercise Comments Pt completed 1st day of exercise. She exercised 15 min on the track and recumbent elliptical. [place] averaged 1.62 METs on the track and 1.2 METs on the recumbent elliptical. She performed the warmup and cooldown standing holding onto a rollator for balance. Discussed METs and how to increase METs                Exercise Goals and Review:   Exercise Goals     Row Name 07/24/22 1234 08/02/22 0805           Exercise Goals   Increase Physical Activity Yes Yes      Intervention Develop an individualized exercise prescription for aerobic and resistive training based on initial evaluation findings, risk stratification, comorbidities and participant's personal goals.;Provide advice, education, support and counseling about physical activity/exercise needs. Develop an individualized exercise  prescription for aerobic and resistive training based on initial  evaluation findings, risk stratification, comorbidities and participant's personal goals.;Provide advice, education, support and counseling about physical activity/exercise needs.      Expected Outcomes Long Term: Add in home exercise to make exercise part of routine and to increase amount of physical activity.;Short Term: Attend rehab on a regular basis to increase amount of physical activity.;Long Term: Exercising regularly at least 3-5 days a week. Long Term: Add in home exercise to make exercise part of routine and to increase amount of physical activity.;Short Term: Attend rehab on a regular basis to increase amount of physical activity.;Long Term: Exercising regularly at least 3-5 days a week.      Increase Strength and Stamina Yes Yes      Intervention Provide advice, education, support and counseling about physical activity/exercise needs.;Develop an individualized exercise prescription for aerobic and resistive training based on initial evaluation findings, risk stratification, comorbidities and participant's personal goals. Provide advice, education, support and counseling about physical activity/exercise needs.;Develop an individualized exercise prescription for aerobic and resistive training based on initial evaluation findings, risk stratification, comorbidities and participant's personal goals.      Expected Outcomes Short Term: Increase workloads from initial exercise prescription for resistance, speed, and METs.;Short Term: Perform resistance training exercises routinely during rehab and add in resistance training at home;Long Term: Improve cardiorespiratory fitness, muscular endurance and strength as measured by increased METs and functional capacity (6MWT) Short Term: Increase workloads from initial exercise prescription for resistance, speed, and METs.;Short Term: Perform resistance training exercises routinely during rehab and add in resistance training at home;Long Term: Improve  cardiorespiratory fitness, muscular endurance and strength as measured by increased METs and functional capacity (6MWT)      Able to understand and use rate of perceived exertion (RPE) scale Yes Yes      Intervention Provide education and explanation on how to use RPE scale Provide education and explanation on how to use RPE scale      Expected Outcomes Short Term: Able to use RPE daily in rehab to express subjective intensity level;Long Term:  Able to use RPE to guide intensity level when exercising independently Short Term: Able to use RPE daily in rehab to express subjective intensity level;Long Term:  Able to use RPE to guide intensity level when exercising independently      Able to understand and use Dyspnea scale Yes Yes      Intervention Provide education and explanation on how to use Dyspnea scale Provide education and explanation on how to use Dyspnea scale      Expected Outcomes Short Term: Able to use Dyspnea scale daily in rehab to express subjective sense of shortness of breath during exertion;Long Term: Able to use Dyspnea scale to guide intensity level when exercising independently Short Term: Able to use Dyspnea scale daily in rehab to express subjective sense of shortness of breath during exertion;Long Term: Able to use Dyspnea scale to guide intensity level when exercising independently      Knowledge and understanding of Target Heart Rate Range (THRR) Yes Yes      Intervention Provide education and explanation of THRR including how the numbers were predicted and where they are located for reference Provide education and explanation of THRR including how the numbers were predicted and where they are located for reference      Expected Outcomes Short Term: Able to state/look up THRR;Short Term: Able to use daily as guideline for intensity in rehab;Long Term: Able to  use THRR to govern intensity when exercising independently Short Term: Able to state/look up THRR;Short Term: Able to use  daily as guideline for intensity in rehab;Long Term: Able to use THRR to govern intensity when exercising independently      Understanding of Exercise Prescription Yes Yes      Intervention Provide education, explanation, and written materials on patient's individual exercise prescription Provide education, explanation, and written materials on patient's individual exercise prescription      Expected Outcomes Short Term: Able to explain program exercise prescription;Long Term: Able to explain home exercise prescription to exercise independently Short Term: Able to explain program exercise prescription;Long Term: Able to explain home exercise prescription to exercise independently               Exercise Goals Re-Evaluation :  Exercise Goals Re-Evaluation     Row Name 08/02/22 0806             Exercise Goal Re-Evaluation   Exercise Goals Review Increase Physical Activity;Able to understand and use Dyspnea scale;Understanding of Exercise Prescription;Increase Strength and Stamina;Knowledge and understanding of Target Heart Rate Range (THRR);Able to understand and use rate of perceived exertion (RPE) scale       Comments Francese has completed 2 exercise sessions. Haliyah exercises for 15 min on the track and Nustep. She averages 1.62 METs on the track and 1.3 METs at level 1 on the Nustep. She performs the warmup and cooldown standing holding onto a rollator for balance. Arren is very deconditioned as she admits to not exercising since the last time she was in rehab. Lachon was originally exercising on the recumbent elliptical. The recumbent elliptical seemed to be difficult for her, so she was moved to the Nustep. Charlia seemed to tolerate the Nustep better. Will continue to monitor and progress as able.       Expected Outcomes Through exercise at rehab and at home, the patient will decrease shortness of breath with daily activities and feel confident in carrying out an exercise regime at home.                 Discharge Exercise Prescription (Final Exercise Prescription Changes):  Exercise Prescription Changes - 08/01/22 1200       Response to Exercise   Blood Pressure (Admit) 120/70    Blood Pressure (Exercise) 142/70    Blood Pressure (Exit) 106/56    Heart Rate (Admit) 87 bpm    Heart Rate (Exercise) 101 bpm    Heart Rate (Exit) 93 bpm    Oxygen Saturation (Admit) 97 %    Oxygen Saturation (Exercise) 88 %    Oxygen Saturation (Exit) 94 %    Rating of Perceived Exertion (Exercise) 17    Perceived Dyspnea (Exercise) 4    Duration Continue with 30 min of aerobic exercise without signs/symptoms of physical distress.    Intensity THRR unchanged      Resistance Training   Training Prescription Yes    Weight red bands    Reps 10-15    Time 10 Minutes      Oxygen   Oxygen Continuous    Liters 8      NuStep   Level 1    Minutes 15    METs 1.3      Track   Laps 4    Minutes 15    METs 1.62      Oxygen   Maintain Oxygen Saturation 88% or higher  Nutrition:  Target Goals: Understanding of nutrition guidelines, daily intake of sodium '1500mg'$ , cholesterol '200mg'$ , calories 30% from fat and 7% or less from saturated fats, daily to have 5 or more servings of fruits and vegetables.  Biometrics:    Nutrition Therapy Plan and Nutrition Goals:  Nutrition Therapy & Goals - 07/30/22 1159       Nutrition Therapy   Diet Heart Healthy Diet    Drug/Food Interactions Statins/Certain Fruits      Personal Nutrition Goals   Nutrition Goal Patient to improve diet quality by using the plate method as a daily guide for meal planning to include lean protein/plant protein, fruits, vegetables, whole grains, nonfat dairy as part of well balanced diet    Personal Goal #2 Patient to identify strategies for weight loss of 0.5-2.0# per week.    Comments Saaya has previously completed pulmonary rehab ~4 years ago. She is motivated to lose weight. She recently started [facility] to  aid with weight loss. Dezaray will benefit from participation in pulmonary rehab for nutrition, exercise, and lifestyle modification.      Intervention Plan   Intervention Prescribe, educate and counsel regarding individualized specific dietary modifications aiming towards targeted core components such as weight, hypertension, lipid management, diabetes, heart failure and other comorbidities.;Nutrition handout(s) given to patient.    Expected Outcomes Short Term Goal: Understand basic principles of dietary content, such as calories, fat, sodium, cholesterol and nutrients.;Long Term Goal: Adherence to prescribed nutrition plan.             Nutrition Assessments:  MEDIFICTS Score Key: ?70 Need to make dietary changes  40-70 Heart Healthy Diet ? 40 Therapeutic Level Cholesterol Diet   Picture Your Plate Scores: D34-534 Unhealthy dietary pattern with much room for improvement. 41-50 Dietary pattern unlikely to meet recommendations for good health and room for improvement. 51-60 More healthful dietary pattern, with some room for improvement.  >60 Healthy dietary pattern, although there may be some specific behaviors that could be improved.    Nutrition Goals Re-Evaluation:  Nutrition Goals Re-Evaluation     Carney Name 07/30/22 1159             Goals   Current Weight 226 lb 13.7 oz (102.9 kg)       Comment triglycerides 229, A1c 6.4       Expected Outcome Shantelle has previously completed pulmonary rehab ~4 years ago. She is motivated to lose weight. She recently started [facility] to aid with weight loss. Aldeen will benefit from participation in pulmonary rehab for nutrition, exercise, and lifestyle modification.                Nutrition Goals Discharge (Final Nutrition Goals Re-Evaluation):  Nutrition Goals Re-Evaluation - 07/30/22 1159       Goals   Current Weight 226 lb 13.7 oz (102.9 kg)    Comment triglycerides 229, A1c 6.4    Expected Outcome Unkown has previously completed pulmonary  rehab ~4 years ago. She is motivated to lose weight. She recently started [facility] to aid with weight loss. Tylaysia will benefit from participation in pulmonary rehab for nutrition, exercise, and lifestyle modification.             Psychosocial: Target Goals: Acknowledge presence or absence of significant depression and/or stress, maximize coping skills, provide positive support system. Participant is able to verbalize types and ability to use techniques and skills needed for reducing stress and depression.  Initial Review & Psychosocial Screening:  Initial Psych Review & Screening -  07/24/22 1056       Initial Review   Current issues with None Identified      Family Dynamics   Good Support System? Yes      Barriers   Psychosocial barriers to participate in program There are no identifiable barriers or psychosocial needs.      Screening Interventions   Interventions Encouraged to exercise             Quality of Life Scores:  Scores of 19 and below usually indicate a poorer quality of life in these areas.  A difference of  2-3 points is a clinically meaningful difference.  A difference of 2-3 points in the total score of the Quality of Life Index has been associated with significant improvement in overall quality of life, self-image, physical symptoms, and general health in studies assessing change in quality of life.  PHQ-9: Review Flowsheet  More data exists      07/24/2022 04/15/2022 04/03/2022 12/24/2021 05/22/2021  Depression screen PHQ 2/9  Decreased Interest 0 0 0 0 0  Down, Depressed, Hopeless 0 0 0 0 0  PHQ - 2 Score 0 0 0 0 0  Altered sleeping 0 0 - - 0  Tired, decreased energy 0 1 - - 0  Change in appetite 0 0 - - 0  Feeling bad or failure about yourself  0 0 - - 0  Trouble concentrating 0 0 - - 0  Moving slowly or fidgety/restless 0 0 - - 0  Suicidal thoughts 0 0 - - 0  PHQ-9 Score 0 1 - - 0  Difficult doing work/chores - Somewhat difficult - - -    Interpretation of Total Score  Total Score Depression Severity:  1-4 = Minimal depression, 5-9 = Mild depression, 10-14 = Moderate depression, 15-19 = Moderately severe depression, 20-27 = Severe depression   Psychosocial Evaluation and Intervention:  Psychosocial Evaluation - 07/24/22 1056       Psychosocial Evaluation & Interventions   Interventions Encouraged to exercise with the program and follow exercise prescription    Comments Sativa currently denies any psychosocial barriers or concerns    Expected Outcomes For Trenisha to participate in pulm rehab without any psychosocial barriers or concerns    Continue Psychosocial Services  No Follow up required             Psychosocial Re-Evaluation:  Psychosocial Re-Evaluation     Row Name 07/31/22 1146             Psychosocial Re-Evaluation   Current issues with None Identified       Comments Enayah remains to have no barriers or psychosocial concerns at this time.       Expected Outcomes Jameria will remain free of any psychosocial concerns.       Interventions Encouraged to attend Pulmonary Rehabilitation for the exercise       Continue Psychosocial Services  No Follow up required                Psychosocial Discharge (Final Psychosocial Re-Evaluation):  Psychosocial Re-Evaluation - 07/31/22 1146       Psychosocial Re-Evaluation   Current issues with None Identified    Comments Kateisha remains to have no barriers or psychosocial concerns at this time.    Expected Outcomes Akea will remain free of any psychosocial concerns.    Interventions Encouraged to attend Pulmonary Rehabilitation for the exercise    Continue Psychosocial Services  No Follow up required  Education: Education Goals: Education classes will be provided on a weekly basis, covering required topics. Participant will state understanding/return demonstration of topics presented.  Learning Barriers/Preferences:  Learning Barriers/Preferences -  07/24/22 1057       Learning Barriers/Preferences   Learning Barriers Hearing    Learning Preferences Individual Instruction;Written Material             Education Topics: Introduction to Pulmonary Rehab Group instruction provided by PowerPoint, verbal discussion, and written material to support subject matter. Instructor reviews what Pulmonary Rehab is, the purpose of the program, and how patients are referred.     Know Your Numbers Group instruction that is supported by a PowerPoint presentation. Instructor discusses importance of knowing and understanding resting, exercise, and post-exercise oxygen saturation, heart rate, and blood pressure. Oxygen saturation, heart rate, blood pressure, rating of perceived exertion, and dyspnea are reviewed along with a normal range for these values.    Exercise for the Pulmonary Patient Group instruction that is supported by a PowerPoint presentation. Instructor discusses benefits of exercise, core components of exercise, frequency, duration, and intensity of an exercise routine, importance of utilizing pulse oximetry during exercise, safety while exercising, and options of places to exercise outside of rehab.       MET Level  Group instruction provided by PowerPoint, verbal discussion, and written material to support subject matter. Instructor reviews what METs are and how to increase METs.    Pulmonary Medications Verbally interactive group education provided by instructor with focus on inhaled medications and proper administration.   Anatomy and Physiology of the Respiratory System Group instruction provided by PowerPoint, verbal discussion, and written material to support subject matter. Instructor reviews respiratory cycle and anatomical components of the respiratory system and their functions. Instructor also reviews differences in obstructive and restrictive respiratory diseases with examples of each.    Oxygen Safety Group  instruction provided by PowerPoint, verbal discussion, and written material to support subject matter. There is an overview of "What is Oxygen" and "Why do we need it".  Instructor also reviews how to create a safe environment for oxygen use, the importance of using oxygen as prescribed, and the risks of noncompliance. There is a brief discussion on traveling with oxygen and resources the patient may utilize.   Oxygen Use Group instruction provided by PowerPoint, verbal discussion, and written material to discuss how supplemental oxygen is prescribed and different types of oxygen supply systems. Resources for more information are provided.    Breathing Techniques Group instruction that is supported by demonstration and informational handouts. Instructor discusses the benefits of pursed lip and diaphragmatic breathing and detailed demonstration on how to perform both.     Risk Factor Reduction Group instruction that is supported by a PowerPoint presentation. Instructor discusses the definition of a risk factor, different risk factors for pulmonary disease, and how the heart and lungs work together.   MD Day A group question and answer session with a medical doctor that allows participants to ask questions that relate to their pulmonary disease state.   Nutrition for the Pulmonary Patient Group instruction provided by PowerPoint slides, verbal discussion, and written materials to support subject matter. The instructor gives an explanation and review of healthy diet recommendations, which includes a discussion on weight management, recommendations for fruit and vegetable consumption, as well as protein, fluid, caffeine, fiber, sodium, sugar, and alcohol. Tips for eating when patients are short of breath are discussed.    Other Education Group or individual verbal, written, or  video instructions that support the educational goals of the pulmonary rehab program.    Knowledge Questionnaire  Score:   Core Components/Risk Factors/Patient Goals at Admission:  Personal Goals and Risk Factors at Admission - 07/24/22 1057       Core Components/Risk Factors/Patient Goals on Admission    Weight Management Weight Loss    Improve shortness of breath with ADL's Yes    Intervention Provide education, individualized exercise plan and daily activity instruction to help decrease symptoms of SOB with activities of daily living.    Expected Outcomes Short Term: Improve cardiorespiratory fitness to achieve a reduction of symptoms when performing ADLs;Long Term: Be able to perform more ADLs without symptoms or delay the onset of symptoms    Increase knowledge of respiratory medications and ability to use respiratory devices properly  Yes    Intervention Provide education and demonstration as needed of appropriate use of medications, inhalers, and oxygen therapy.    Expected Outcomes Short Term: Achieves understanding of medications use. Understands that oxygen is a medication prescribed by physician. Demonstrates appropriate use of inhaler and oxygen therapy.;Long Term: Maintain appropriate use of medications, inhalers, and oxygen therapy.             Core Components/Risk Factors/Patient Goals Review:   Goals and Risk Factor Review     Row Name 07/31/22 1147             Core Components/Risk Factors/Patient Goals Review   Personal Goals Review Develop more efficient breathing techniques such as purse lipped breathing and diaphragmatic breathing and practicing self-pacing with activity.;Increase knowledge of respiratory medications and ability to use respiratory devices properly.;Improve shortness of breath with ADL's       Review Adelmira has attended 1 PR class so far. She is currently exercising on the track and level 1 on the Octane. We will continue any progress she continues to make.       Expected Outcomes See admission goals                Core Components/Risk Factors/Patient  Goals at Discharge (Final Review):   Goals and Risk Factor Review - 07/31/22 1147       Core Components/Risk Factors/Patient Goals Review   Personal Goals Review Develop more efficient breathing techniques such as purse lipped breathing and diaphragmatic breathing and practicing self-pacing with activity.;Increase knowledge of respiratory medications and ability to use respiratory devices properly.;Improve shortness of breath with ADL's    Review Alfrieda has attended 1 PR class so far. She is currently exercising on the track and level 1 on the Octane. We will continue any progress she continues to make.    Expected Outcomes See admission goals             ITP Comments:   Comments: Dr. Rodman Pickle is Medical Director for Pulmonary Rehab at [facility].

## 2022-08-08 ENCOUNTER — Encounter (HOSPITAL_COMMUNITY)
Admission: RE | Admit: 2022-08-08 | Discharge: 2022-08-08 | Disposition: A | Discharge status: Home / Self Care | Payer: Medicare Other | Source: Ambulatory Visit | Attending: Pulmonary Disease | Admitting: Pulmonary Disease

## 2022-08-08 DIAGNOSIS — J449 Chronic obstructive pulmonary disease, unspecified: Secondary | ICD-10-CM | POA: Diagnosis not present

## 2022-08-08 DIAGNOSIS — Z5189 Encounter for other specified aftercare: Secondary | ICD-10-CM | POA: Diagnosis not present

## 2022-08-08 NOTE — Progress Notes (Signed)
Daily Session Note  Patient Details  Name: SKILAR WAAGE MRN: [id number] Date of Birth: 05-21-1946 Referring Provider:   April Manson Pulmonary Rehab Walk Test from 07/24/2022 in [facility] for Heart, Vascular, & Lung Health  Referring Provider [name]       Encounter Date: 08/08/2022  Check In:  Session Check In - 08/08/22 1201       Check-In   Supervising physician immediately available to respond to emergencies [facility] MD immediately available    Physician(s) Dr Gala Romney    Location [facility]    Staff Present Janine Ores, RN, Quentin Ore, MS, ACSM-CEP, Exercise Physiologist;Randi Yevonne Pax, ACSM-CEP, Exercise Physiologist;Samantha [place], RD, LDN;Other    Virtual Visit No    Medication changes reported     No    Fall or balance concerns reported    No    Tobacco Cessation No Change    Warm-up and Cool-down Performed as group-led instruction   Pt here for orientation   Resistance Training Performed Yes    VAD Patient? No    PAD/SET Patient? No      Pain Assessment   Currently in Pain? No/denies    Multiple Pain Sites No             Capillary Blood Glucose: No results found for this or any previous visit (from the past 24 hour(s)).    Social History   Tobacco Use  Smoking Status Former   Packs/day: 1.00   Years: 39.00   Additional pack years: 0.00   Total pack years: 39.00   Types: Cigarettes   Quit date: 11/04/2018   Years since quitting: 3.7  Smokeless Tobacco Never    Goals Met:  Proper associated with RPD/PD & O2 Sat Independence with exercise equipment Exercise tolerated well No report of concerns or symptoms today Strength training completed today  Goals Unmet:  Not Applicable  Comments: Service time is from 1015 to 1150.    Dr. Rodman Pickle is Medical Director for Pulmonary Rehab at [facility].

## 2022-08-12 ENCOUNTER — Encounter: Payer: Self-pay | Admitting: Pharmacist

## 2022-08-12 MED ORDER — MOUNJARO 7.5 MG/0.5ML ~~LOC~~ SOAJ: 7.5000 mg | SUBCUTANEOUS | 1 refills | Status: DC

## 2022-08-12 NOTE — Addendum Note (Signed)
Addended by: Rollen Sox on: 08/12/2022 04:42 PM   Modules accepted: Orders

## 2022-08-13 ENCOUNTER — Other Ambulatory Visit: Payer: Self-pay | Admitting: Pulmonary Disease

## 2022-08-13 ENCOUNTER — Encounter (HOSPITAL_COMMUNITY)
Admission: RE | Admit: 2022-08-13 | Discharge: 2022-08-13 | Disposition: A | Discharge status: Home / Self Care | Payer: Medicare Other | Source: Ambulatory Visit | Attending: Pulmonary Disease | Admitting: Pulmonary Disease

## 2022-08-13 VITALS — Wt 222.4 lb

## 2022-08-13 DIAGNOSIS — J449 Chronic obstructive pulmonary disease, unspecified: Secondary | ICD-10-CM

## 2022-08-13 DIAGNOSIS — Z5189 Encounter for other specified aftercare: Secondary | ICD-10-CM | POA: Diagnosis not present

## 2022-08-13 NOTE — Progress Notes (Signed)
Daily Session Note  Patient Details  Name: PALMER CRINCOLI MRN: [id number] Date of Birth: 11-07-1945 Referring Provider:   April Manson Pulmonary Rehab Walk Test from 07/24/2022 in [facility] for Heart, Vascular, & [place]  Referring Provider [name]       Encounter Date: 08/13/2022  Check In:  Session Check In - 08/13/22 1206       Check-In   Supervising physician immediately available to respond to emergencies [facility] MD immediately available    Physician(s) Eric Form, NP    [facility]    Staff Present Janine Ores, RN, Quentin Ore, MS, ACSM-CEP, Exercise Physiologist;Randi Yevonne Pax, ACSM-CEP, Exercise Physiologist;Samantha [place], RD, LDN;Other    Virtual Visit No    Medication changes reported     No    Fall or balance concerns reported    No    Tobacco Cessation No Change    Warm-up and Cool-down Performed as group-led instruction    Resistance Training Performed Yes    VAD Patient? No    PAD/SET Patient? No      Pain Assessment   Currently in Pain? No/denies    Pain Score 0-No pain    Multiple Pain Sites No             Capillary Blood Glucose: No results found for this or any previous visit (from the past 24 hour(s)).   Exercise Prescription Changes - 08/13/22 1200       Response to Exercise   Blood Pressure (Admit) 120/64    Blood Pressure (Exercise) 156/80    Blood Pressure (Exit) 132/70    Heart Rate (Admit) 89 bpm    Heart Rate (Exercise) 107 bpm    Heart Rate (Exit) 93 bpm    Oxygen Saturation (Admit) 98 %    Oxygen Saturation (Exercise) 89 %    Oxygen Saturation (Exit) 98 %    Rating of Perceived Exertion (Exercise) 13    Perceived Dyspnea (Exercise) 3    Duration Continue with 30 min of aerobic exercise without signs/symptoms of physical distress.    Intensity THRR unchanged      Progression   Progression Continue to progress workloads to maintain intensity without signs/symptoms of  physical distress.      Resistance Training   Training Prescription Yes    Weight red bands    Reps 10-15    Time 10 Minutes      Oxygen   Oxygen Continuous    Liters 6-8      NuStep   Level 1    Minutes 15    METs 1.6      Track   Laps 7    Minutes 15    METs 2.23      Oxygen   Maintain Oxygen Saturation 88% or higher             Social History   Tobacco Use  Smoking Status Former   Packs/day: 1.00   Years: 39.00   Additional pack years: 0.00   Total pack years: 39.00   Types: Cigarettes   Quit date: 11/04/2018   Years since quitting: 3.7  Smokeless Tobacco Never    Goals Met:  Proper associated with RPD/PD & O2 Sat Exercise tolerated well No report of concerns or symptoms today Strength training completed today  Goals Unmet:  Not Applicable  Comments: Service time is from 1015 to 1148.    Dr. Rodman Pickle is Medical Director for Pulmonary  Rehab at [facility].

## 2022-08-13 NOTE — Telephone Encounter (Signed)
Please advise on refill request

## 2022-08-15 ENCOUNTER — Encounter (HOSPITAL_COMMUNITY)
Admission: RE | Admit: 2022-08-15 | Discharge: 2022-08-15 | Disposition: A | Discharge status: Home / Self Care | Payer: Medicare Other | Source: Ambulatory Visit | Attending: Pulmonary Disease | Admitting: Pulmonary Disease

## 2022-08-15 ENCOUNTER — Encounter (HOSPITAL_BASED_OUTPATIENT_CLINIC_OR_DEPARTMENT_OTHER): Payer: Self-pay | Admitting: Pulmonary Disease

## 2022-08-15 DIAGNOSIS — R262 Difficulty in walking, not elsewhere classified: Secondary | ICD-10-CM

## 2022-08-15 DIAGNOSIS — Z5189 Encounter for other specified aftercare: Secondary | ICD-10-CM | POA: Diagnosis not present

## 2022-08-15 DIAGNOSIS — R5381 Other malaise: Secondary | ICD-10-CM

## 2022-08-15 DIAGNOSIS — J449 Chronic obstructive pulmonary disease, unspecified: Secondary | ICD-10-CM

## 2022-08-15 DIAGNOSIS — J9611 Chronic respiratory failure with hypoxia: Secondary | ICD-10-CM

## 2022-08-15 NOTE — Progress Notes (Signed)
Daily Session Note  Patient Details  Name: Rebecca Mcintyre MRN: [id number] Date of Birth: 08-Aug-1945 Referring Provider:   April Manson Pulmonary Rehab Walk Test from 07/24/2022 in [facility] for Heart, Vascular, & [place]  Referring Provider [name]       Encounter Date: 08/15/2022  Check In:  Session Check In - 08/15/22 1216       Check-In   Supervising physician immediately available to respond to emergencies [facility] MD immediately available    Physician(s) Shalhoub    [facility]    Staff Present Janine Ores, RN, Quentin Ore, MS, ACSM-CEP, Exercise Physiologist;Samantha [place], [place], LDN;Other;Carlette Wilber Oliphant, RN, BSN    Virtual Visit No    Medication changes reported     No    Fall or balance concerns reported    No    Tobacco Cessation No Change    Warm-up and Cool-down Performed as group-led Higher education careers adviser Performed Yes    VAD Patient? No    PAD/SET Patient? No      Pain Assessment   Currently in Pain? No/denies    Pain Score 0-No pain    Multiple Pain Sites No             Capillary Blood Glucose: No results found for this or any previous visit (from the past 24 hour(s)).    Social History   Tobacco Use  Smoking Status Former   Packs/day: 1.00   Years: 39.00   Additional pack years: 0.00   Total pack years: 39.00   Types: Cigarettes   Quit date: 11/04/2018   Years since quitting: 3.7  Smokeless Tobacco Never    Goals Met:  Proper associated with RPD/PD & O2 Sat Exercise tolerated well No report of concerns or symptoms today Strength training completed today  Goals Unmet:  Not Applicable  Comments: Service time is from 1019 to 1141.    Dr. Rodman Pickle is Medical Director for Pulmonary Rehab at [facility].

## 2022-08-15 NOTE — Telephone Encounter (Signed)
Do you agree to place order

## 2022-08-16 ENCOUNTER — Other Ambulatory Visit: Payer: Self-pay

## 2022-08-16 ENCOUNTER — Other Ambulatory Visit (HOSPITAL_COMMUNITY): Payer: Self-pay

## 2022-08-16 MED ORDER — MOUNJARO 7.5 MG/0.5ML ~~LOC~~ SOAJ
7.5000 mg | SUBCUTANEOUS | 1 refills | Status: DC
  Filled 2022-08-16 – 2022-08-20 (×3): qty 2, 28d supply, fill #0

## 2022-08-16 NOTE — Addendum Note (Signed)
Addended by: Rollen Sox on: 08/16/2022 09:47 AM   Modules accepted: Orders

## 2022-08-18 ENCOUNTER — Other Ambulatory Visit: Payer: Self-pay | Admitting: Cardiology

## 2022-08-18 DIAGNOSIS — E119 Type 2 diabetes mellitus without complications: Secondary | ICD-10-CM

## 2022-08-20 ENCOUNTER — Other Ambulatory Visit (HOSPITAL_COMMUNITY): Payer: Self-pay

## 2022-08-20 ENCOUNTER — Encounter (HOSPITAL_COMMUNITY): Payer: Medicare Other

## 2022-08-21 NOTE — Telephone Encounter (Signed)
Called pts daughter to setup recall. Pt is sch 4/11 with Dr. [name]. Pt daughter states she has not heard anything about getting patient a walker WITH a seat. Please advise since [place] is currently out and call patient with an update if applicable.

## 2022-08-22 ENCOUNTER — Other Ambulatory Visit: Payer: Self-pay | Admitting: Internal Medicine

## 2022-08-22 ENCOUNTER — Encounter (HOSPITAL_COMMUNITY): Payer: Medicare Other

## 2022-08-22 ENCOUNTER — Telehealth (HOSPITAL_COMMUNITY): Payer: Self-pay

## 2022-08-22 NOTE — Telephone Encounter (Signed)
Called pt to check on her due to her missing two pulmonary rehab sessions. Pt did not answer phone. Voicemail was left.

## 2022-08-22 NOTE — Telephone Encounter (Signed)
Spoke to pt.   Pt reports she is taking 2 tablets twice a day.  Offer to schedule future appt. Pt states she will call back to schedule the appt.

## 2022-08-23 ENCOUNTER — Telehealth (HOSPITAL_COMMUNITY): Payer: Self-pay

## 2022-08-23 NOTE — Telephone Encounter (Signed)
Called patient's daughter after not hearing back from [place]. Daughter stated she "was under the weather" and should be back to [facility] next week.

## 2022-08-26 ENCOUNTER — Ambulatory Visit: Payer: Medicare Other | Admitting: Neurology

## 2022-08-27 ENCOUNTER — Encounter (HOSPITAL_COMMUNITY): Payer: Medicare Other

## 2022-08-27 DIAGNOSIS — J9611 Chronic respiratory failure with hypoxia: Secondary | ICD-10-CM | POA: Diagnosis not present

## 2022-08-28 DIAGNOSIS — J9611 Chronic respiratory failure with hypoxia: Secondary | ICD-10-CM | POA: Diagnosis not present

## 2022-08-28 DIAGNOSIS — J449 Chronic obstructive pulmonary disease, unspecified: Secondary | ICD-10-CM | POA: Diagnosis not present

## 2022-08-28 DIAGNOSIS — J9612 Chronic respiratory failure with hypercapnia: Secondary | ICD-10-CM | POA: Diagnosis not present

## 2022-08-29 ENCOUNTER — Encounter (HOSPITAL_COMMUNITY)
Admission: RE | Admit: 2022-08-29 | Discharge: 2022-08-29 | Disposition: A | Discharge status: Home / Self Care | Payer: Medicare Other | Source: Ambulatory Visit | Attending: Pulmonary Disease | Admitting: Pulmonary Disease

## 2022-08-29 DIAGNOSIS — J449 Chronic obstructive pulmonary disease, unspecified: Secondary | ICD-10-CM | POA: Insufficient documentation

## 2022-09-03 ENCOUNTER — Encounter (HOSPITAL_COMMUNITY)
Admission: RE | Admit: 2022-09-03 | Discharge: 2022-09-03 | Disposition: A | Discharge status: Home / Self Care | Payer: Medicare Other | Source: Ambulatory Visit | Attending: Pulmonary Disease | Admitting: Pulmonary Disease

## 2022-09-03 DIAGNOSIS — J449 Chronic obstructive pulmonary disease, unspecified: Secondary | ICD-10-CM

## 2022-09-03 NOTE — Progress Notes (Signed)
Home Exercise Prescription I have reviewed a Home Exercise Prescription with [name]. Rebecca Mcintyre is performing the warmup and cooldown exercises at home. She is not doing any long bouts of aerobic exercise. I discussed starting some walking at home. I encouraged Rebecca Mcintyre to start walking 1 non-rehab day/wk for 3x5 min. She agreed with my recommendations. Rebecca Mcintyre seems motivated to exercise and improve her functional capacity. The patient stated that their goals were to increase independency. We reviewed exercise guidelines, target heart rate during exercise, RPE Scale, weather conditions, endpoints for exercise, warmup and cool down. The patient is encouraged to come to me with any questions. I will continue to follow up with the patient to assist them with progression and safety.    Rebecca San, MS, ACSM-CEP 09/03/2022 3:58 PM

## 2022-09-03 NOTE — Progress Notes (Signed)
Daily Session Note  Patient Details  Name: Rebecca Mcintyre MRN: [id number] Date of Birth: August 03, 1945 Referring Provider:   Doristine Devoid Pulmonary Rehab Walk Test from 07/24/2022 in [facility] for Heart, Vascular, & Lung Health  Referring Provider Vassie Loll       Encounter Date: 09/03/2022  Check In:  Session Check In - 09/03/22 1157       Check-In   Supervising physician immediately available to respond to emergencies [facility] MD immediately available    Physician(s) Edd Fabian, NP    [facility]    Staff Present Essie Hart, RN, Doris Cheadle, MS, ACSM-CEP, Exercise Physiologist;Samantha Belarus, RD, LDN;Carlette Les Pou, RN, BSN;Randi Reeve BS, ACSM-CEP, Exercise Physiologist;Niara Bunker Katrinka Blazing, RT    Virtual Visit No    Medication changes reported     No    Fall or balance concerns reported    No    Tobacco Cessation No Change    Warm-up and Cool-down Performed as group-led instruction    Resistance Training Performed Yes    VAD Patient? No    PAD/SET Patient? No      Pain Assessment   Currently in Pain? No/denies    Multiple Pain Sites No             Capillary Blood Glucose: No results found for this or any previous visit (from the past 24 hour(s)).    Social History   Tobacco Use  Smoking Status Former   Packs/day: 1.00   Years: 39.00   Additional pack years: 0.00   Total pack years: 39.00   Types: Cigarettes   Quit date: 11/04/2018   Years since quitting: 3.8  Smokeless Tobacco Never    Goals Met:  Proper associated with RPD/PD & O2 Sat Independence with exercise equipment Exercise tolerated well No report of concerns or symptoms today Strength training completed today  Goals Unmet:  Not Applicable  Comments: Service time is from 1011 to 1134.    Dr. Mechele Collin is Medical Director for Pulmonary Rehab at [facility].

## 2022-09-04 NOTE — Progress Notes (Signed)
Pulmonary Individual Treatment Plan  Patient Details  Name: Rebecca Mcintyre MRN: [id number] Date of Birth: August 15, 1945 Referring Provider:   Doristine Devoid Pulmonary Rehab Walk Test from 07/24/2022 in [facility]  Referring Provider Vassie Loll       Initial Encounter Date:  Flowsheet Row Pulmonary Rehab Walk Test from 07/24/2022 in [facility]  Date 07/24/22       Visit Diagnosis: Stage 2 moderate COPD by GOLD classification  Patient's Home Medications on Admission:   Current Outpatient Medications:    ACCU-CHEK GUIDE test strip, USE AS DIRECTED TWICE A DAY, Disp: 100 strip, Rfl: 3   Accu-Chek Softclix Lancets lancets, Use as instructed twice a day Diagnosis E11.9, Disp: 100 each, Rfl: 3   acetaminophen (TYLENOL) 500 MG tablet, Take 1,000 mg by mouth every 6 (six) hours as needed for mild pain., Disp: , Rfl:    albuterol (PROVENTIL) (2.5 MG/3ML) 0.083% nebulizer solution, Take 3 mLs (2.5 mg total) by nebulization every 6 (six) hours as needed for wheezing or shortness of breath., Disp: 300 mL, Rfl: 11   albuterol (VENTOLIN HFA) 108 (90 Base) MCG/ACT inhaler, USE 2 PUFFS EVERY 6 HOURS AS NEEDED FOR WHEEZE OR SHORTNESS OF BREATH (Patient taking differently: Inhale 2 puffs into the lungs every 6 (six) hours as needed for wheezing or shortness of breath.), Disp: 8.5 each, Rfl: 5   allopurinol (ZYLOPRIM) 100 MG tablet, TAKE 1 TABLET BY MOUTH TWICE A DAY, Disp: 180 tablet, Rfl: 1   arformoterol (BROVANA) 15 MCG/2ML NEBU, Take 2 mLs (15 mcg total) by nebulization 2 (two) times daily., Disp: 120 mL, Rfl: 11   aspirin 81 MG tablet, Take 81 mg by mouth daily., Disp: , Rfl:    Blood Glucose Monitoring Suppl (ACCU-CHEK GUIDE) w/Device KIT, Use as directed twice a day Diagnosis E11.9, Disp: 1 kit, Rfl: 2   budesonide (PULMICORT) 0.5 MG/2ML nebulizer solution, Take 2 mLs (0.5 mg total) by  nebulization 2 (two) times daily., Disp: 120 mL, Rfl: 12   calcium carbonate (TUMS EX) 750 MG chewable tablet, Chew 750 mg by mouth daily as needed for heartburn., Disp: , Rfl:    dapagliflozin propanediol (FARXIGA) 10 MG TABS tablet, Take 1 tablet (10 mg total) by mouth daily., Disp: 30 tablet, Rfl: 3   furosemide (LASIX) 40 MG tablet, TAKE 1 TABLET BY MOUTH EVERY DAY, Disp: 90 tablet, Rfl: 0   guaiFENesin (MUCINEX) 600 MG 12 hr tablet, Take 2 tablets (1,200 mg total) by mouth 2 (two) times daily., Disp: 60 tablet, Rfl: 0   KLOR-CON M10 10 MEQ tablet, TAKE 1 TABLET BY MOUTH TWICE A DAY, Disp: 180 tablet, Rfl: 0   metFORMIN (GLUCOPHAGE) 1000 MG tablet, Take 1 tablet (1,000 mg total) by mouth 2 (two) times daily with a meal., Disp: 60 tablet, Rfl: 3   metFORMIN (GLUCOPHAGE) 500 MG tablet, Take 2 tablets (1,000 mg total) by mouth in the morning and at bedtime., Disp: 360 tablet, Rfl: 0   metoprolol tartrate (LOPRESSOR) 25 MG tablet, TAKE 1/2 TABLETS BY MOUTH 2 TIMES DAILY, Disp: 90 tablet, Rfl: 0   nitroGLYCERIN (NITROSTAT) 0.4 MG SL tablet, PLACE 1 TABLET UNDER THE TONGUE EVERY 5 MINUTES AS NEEDED. (Patient taking differently: Place 0.4 mg under the tongue every 5 (five) minutes as needed for chest pain.), Disp: 25 tablet, Rfl: 1   pantoprazole (PROTONIX) 40 MG tablet, Take 1 tablet (40 mg  total) by mouth daily., Disp: 30 tablet, Rfl: 3   Polyethylene Glycol 400 (VISINE DRY EYE RELIEF OP), Place 1 drop into both eyes daily as needed (dry eyes)., Disp: , Rfl:    predniSONE (DELTASONE) 10 MG tablet, Prednisone dosing: Take  Prednisone 40mg  (4 tabs) x 2 days, then taper to 30mg  (3 tabs) x 3 days, then 20mg  (2 tabs) x 3days, then 10mg  (1 tab) x 3days, then OFF., Disp: 26 tablet, Rfl: 0   revefenacin (YUPELRI) 175 MCG/3ML nebulizer solution, Take 3 mLs (175 mcg total) by nebulization daily., Disp: 90 mL, Rfl: 6   rOPINIRole (REQUIP) 0.5 MG tablet, TAKE 1 TABLET AT BEDTIME FOR RESTLESS LEG, Disp: 90 tablet,  Rfl: 0   rosuvastatin (CRESTOR) 20 MG tablet, Take 1 tablet (20 mg total) by mouth daily. (Patient taking differently: Take 20 mg by mouth at bedtime.), Disp: 90 tablet, Rfl: 3   Spacer/Aero-Hold Chamber Bags MISC, Please show patient how to use, Disp: 1 each, Rfl: 0   tirzepatide (MOUNJARO) 7.5 MG/0.5ML Pen, Inject 7.5 mg into the skin once a week., Disp: 2 mL, Rfl: 1  Past Medical History: Past Medical History:  Diagnosis Date   Acute on chronic respiratory failure with hypoxia and hypercapnia (HCC) 11/05/2018   Allergy    ASCUS on Pap smear    had repeat x 2 normal with Dr. Senaida Oresichardson 2011   Coronary artery disease    Gout    Hyperlipidemia    Hypertension    Myocardial infarction [facility](HCC)    2002 95% proximal LAD stenosis with thrombus followed by 90% stenosis, the first diagonal 80% stenosis, circumflex 30% stenosis, circumflex obtuse marginal subbranch 70% stenosis, PDA 40% stenosis. She had a drug-eluting stent placement with a Pixel stent   Osteopenia    Solitary kidney     Tobacco Use: Social History   Tobacco Use  Smoking Status Former   Packs/day: 1.00   Years: 39.00   Additional pack years: 0.00   Total pack years: 39.00   Types: Cigarettes   Quit date: 11/04/2018   Years since quitting: 3.8  Smokeless Tobacco Never    Labs: Review Flowsheet  More data exists      Latest Ref Rng & Units 04/10/2021 01/29/2022 02/06/2022 04/15/2022 06/04/2022  Labs for ITP Cardiac and Pulmonary Rehab  Cholestrol 100 - 199 mg/dL 308169  [id number]  - - [id number]   LDL (calc) 0 - 99 mg/dL 96  99  - - 76   HDL-C >39 mg/dL [id number]  43  - - 59   Trlycerides 0 - 149 mg/dL 528.4158.0  132139  - - 440229   Hemoglobin A1c 4.0 - 5.6 % 7.5  - - 6.4  -  PH, Arterial 7.35 - 7.45 - - 7.47  - -  PCO2 arterial 32 - 48 mmHg - - 57  - -  Bicarbonate 20.0 - 28.0 mmol/L - - 41.5  - -  O2 Saturation % - - 94.3  - -    Capillary Blood Glucose: Lab Results  Component Value Date   GLUCAP 115 (H) 08/01/2022   GLUCAP 93  08/01/2022   GLUCAP 114 (H) 07/30/2022   GLUCAP 105 (H) 07/30/2022   GLUCAP 124 (H) 05/30/2022    POCT Glucose     Row Name 07/24/22 1044             POCT Blood Glucose   Pre-Exercise 98 mg/dL  Pulmonary Assessment Scores:  Pulmonary Assessment Scores     Row Name 07/24/22 1055         ADL UCSD   SOB Score total 81       CAT Score   CAT Score 18       mMRC Score   mMRC Score 4             UCSD: Self-administered rating of dyspnea associated with activities of daily living (ADLs) 6-point scale (0 = "not at all" to 5 = "maximal or unable to do because of breathlessness")  Scoring Scores range from 0 to 120.  Minimally important difference is 5 units  CAT: CAT can identify the health impairment of COPD patients and is better correlated with disease progression.  CAT has a scoring range of zero to 40. The CAT score is classified into four groups of low (less than 10), medium (10 - 20), high (21-30) and very high (31-40) based on the impact level of disease on health status. A CAT score over 10 suggests significant symptoms.  A worsening CAT score could be explained by an exacerbation, poor medication adherence, poor inhaler technique, or progression of COPD or comorbid conditions.  CAT MCID is 2 points  mMRC: mMRC (Modified Medical Research Council) Dyspnea Scale is used to assess the degree of baseline functional disability in patients of respiratory disease due to dyspnea. No minimal important difference is established. A decrease in score of 1 point or greater is considered a positive change.   Pulmonary Function Assessment:  Pulmonary Function Assessment - 07/24/22 1055       Breath   Bilateral Breath Sounds Decreased;Expiratory;Wheezes    Shortness of Breath Yes;Fear of Shortness of Breath;Panic with Shortness of Breath;Limiting activity             Exercise Target Goals: Exercise Program Goal: Individual exercise prescription  set using results from initial 6 min walk test and THRR while considering  patient's activity barriers and safety.   Exercise Prescription Goal: Initial exercise prescription builds to 30-45 minutes a day of aerobic activity, 2-3 days per week.  Home exercise guidelines will be given to patient during program as part of exercise prescription that the participant will acknowledge.  Activity Barriers & Risk Stratification:  Activity Barriers & Cardiac Risk Stratification - 07/24/22 1232       Activity Barriers & Cardiac Risk Stratification   Activity Barriers Deconditioning;Muscular Weakness;Shortness of Breath;Assistive Device;Other (comment)    Comments balance issues             6 Minute Walk:  6 Minute Walk     Row Name 07/24/22 1220         6 Minute Walk   Phase Initial     Distance 720 feet     Walk Time 6 minutes     # of Rest Breaks 0     MPH 1.36     METS 1.06     RPE 13     Perceived Dyspnea  2     VO2 Peak 3.72     Resting HR 95 bpm     Resting BP 102/56     Resting Oxygen Saturation  93 %     Exercise Oxygen Saturation  during 6 min walk 87 %     Max Ex. HR 109 bpm     Max Ex. BP 142/70     2 Minute Post BP 128/68       Interval HR  1 Minute HR 101     2 Minute HR 108     3 Minute HR 105     4 Minute HR 103     5 Minute HR 108     6 Minute HR 109     2 Minute Post HR 100     Interval Heart Rate? Yes       Interval Oxygen   Interval Oxygen? Yes     Baseline Oxygen Saturation % 93 %     1 Minute Oxygen Saturation % 94 %     1 Minute Liters of Oxygen 6 L     2 Minute Oxygen Saturation % 88 %  31sec, 87%     2 Minute Liters of Oxygen 6 L  31sec, increased to 8L     3 Minute Oxygen Saturation % 91 %     3 Minute Liters of Oxygen 8 L     4 Minute Oxygen Saturation % 90 %     4 Minute Liters of Oxygen 8 L     5 Minute Oxygen Saturation % 90 %     5 Minute Liters of Oxygen 8 L     6 Minute Oxygen Saturation % 89 %     6 Minute Liters of  Oxygen 8 L     2 Minute Post Oxygen Saturation % 97 %     2 Minute Post Liters of Oxygen 8 L              Oxygen Initial Assessment:  Oxygen Initial Assessment - 07/24/22 1053       Home Oxygen   Home Oxygen Device Home Concentrator;E-Tanks    Sleep Oxygen Prescription Continuous    Liters per minute 4    Home Exercise Oxygen Prescription Continuous    Liters per minute 6    Home Resting Oxygen Prescription Continuous    Liters per minute 4    Compliance with Home Oxygen Use Yes      Initial 6 min Walk   Oxygen Used Continuous    Liters per minute 8      Program Oxygen Prescription   Program Oxygen Prescription Continuous    Liters per minute 8      Intervention   Short Term Goals To learn and exhibit compliance with exercise, home and travel O2 prescription;To learn and understand importance of monitoring SPO2 with pulse oximeter and demonstrate accurate use of the pulse oximeter.;To learn and understand importance of maintaining oxygen saturations>88%;To learn and demonstrate proper pursed lip breathing techniques or other breathing techniques. ;To learn and demonstrate proper use of respiratory medications    Long  Term Goals Exhibits compliance with exercise, home  and travel O2 prescription;Verbalizes importance of monitoring SPO2 with pulse oximeter and return demonstration;Maintenance of O2 saturations>88%;Exhibits proper breathing techniques, such as pursed lip breathing or other method taught during program session;Compliance with respiratory medication             Oxygen Re-Evaluation:  Oxygen Re-Evaluation     Row Name 08/02/22 0819 08/29/22 0706           Program Oxygen Prescription   Program Oxygen Prescription Continuous Continuous      Liters per minute 8 8        Home Oxygen   Home Oxygen Device Home Concentrator;E-Tanks Home Concentrator;E-Tanks      Sleep Oxygen Prescription Continuous Continuous      Liters per minute 4 4  Home Exercise  Oxygen Prescription Continuous Continuous      Liters per minute 6 6      Home Resting Oxygen Prescription Continuous Continuous      Liters per minute 4 4      Compliance with Home Oxygen Use Yes Yes        Goals/Expected Outcomes   Short Term Goals To learn and exhibit compliance with exercise, home and travel O2 prescription;To learn and understand importance of monitoring SPO2 with pulse oximeter and demonstrate accurate use of the pulse oximeter.;To learn and understand importance of maintaining oxygen saturations>88%;To learn and demonstrate proper pursed lip breathing techniques or other breathing techniques. ;To learn and demonstrate proper use of respiratory medications To learn and exhibit compliance with exercise, home and travel O2 prescription;To learn and understand importance of monitoring SPO2 with pulse oximeter and demonstrate accurate use of the pulse oximeter.;To learn and understand importance of maintaining oxygen saturations>88%;To learn and demonstrate proper pursed lip breathing techniques or other breathing techniques. ;To learn and demonstrate proper use of respiratory medications      Long  Term Goals Exhibits compliance with exercise, home  and travel O2 prescription;Verbalizes importance of monitoring SPO2 with pulse oximeter and return demonstration;Maintenance of O2 saturations>88%;Exhibits proper breathing techniques, such as pursed lip breathing or other method taught during program session;Compliance with respiratory medication Exhibits compliance with exercise, home  and travel O2 prescription;Verbalizes importance of monitoring SPO2 with pulse oximeter and return demonstration;Maintenance of O2 saturations>88%;Exhibits proper breathing techniques, such as pursed lip breathing or other method taught during program session;Compliance with respiratory medication      Goals/Expected Outcomes Compliance and understanding of oxygen saturation monitoring and breathing  techniques to decrease shortness of breath. Compliance and understanding of oxygen saturation monitoring and breathing techniques to decrease shortness of breath.               Oxygen Discharge (Final Oxygen Re-Evaluation):  Oxygen Re-Evaluation - 08/29/22 0706       Program Oxygen Prescription   Program Oxygen Prescription Continuous    Liters per minute 8      Home Oxygen   Home Oxygen Device Home Concentrator;E-Tanks    Sleep Oxygen Prescription Continuous    Liters per minute 4    Home Exercise Oxygen Prescription Continuous    Liters per minute 6    Home Resting Oxygen Prescription Continuous    Liters per minute 4    Compliance with Home Oxygen Use Yes      Goals/Expected Outcomes   Short Term Goals To learn and exhibit compliance with exercise, home and travel O2 prescription;To learn and understand importance of monitoring SPO2 with pulse oximeter and demonstrate accurate use of the pulse oximeter.;To learn and understand importance of maintaining oxygen saturations>88%;To learn and demonstrate proper pursed lip breathing techniques or other breathing techniques. ;To learn and demonstrate proper use of respiratory medications    Long  Term Goals Exhibits compliance with exercise, home  and travel O2 prescription;Verbalizes importance of monitoring SPO2 with pulse oximeter and return demonstration;Maintenance of O2 saturations>88%;Exhibits proper breathing techniques, such as pursed lip breathing or other method taught during program session;Compliance with respiratory medication    Goals/Expected Outcomes Compliance and understanding of oxygen saturation monitoring and breathing techniques to decrease shortness of breath.             Initial Exercise Prescription:  Initial Exercise Prescription - 07/24/22 1200       Date of Initial Exercise RX and Referring Provider  Date 07/24/22    Referring Provider Vassie Loll    Expected Discharge Date 10/17/22      Oxygen    Oxygen Continuous    Liters 8    Maintain Oxygen Saturation 88% or higher      NuStep   Level --    SPM --    Minutes --      Recumbant Elliptical   Level 1    Watts 20    Minutes 15    METs 2      Track   Minutes 15      Prescription Details   Frequency (times per week) 2    Duration Progress to 30 minutes of continuous aerobic without signs/symptoms of physical distress      Intensity   THRR 40-80% of Max Heartrate 58-116    Ratings of Perceived Exertion 11-13    Perceived Dyspnea 0-4      Progression   Progression Continue to progress workloads to maintain intensity without signs/symptoms of physical distress.      Resistance Training   Training Prescription Yes    Weight red bands    Reps 10-15             Perform Capillary Blood Glucose checks as needed.  Exercise Prescription Changes:   Exercise Prescription Changes     Row Name 08/01/22 1200 08/13/22 1200 09/03/22 1500         Response to Exercise   Blood Pressure (Admit) 120/70 120/64 --     Blood Pressure (Exercise) 142/70 156/80 --     Blood Pressure (Exit) 106/56 132/70 --     Heart Rate (Admit) 87 bpm 89 bpm --     Heart Rate (Exercise) 101 bpm 107 bpm --     Heart Rate (Exit) 93 bpm 93 bpm --     Oxygen Saturation (Admit) 97 % 98 % --     Oxygen Saturation (Exercise) 88 % 89 % --     Oxygen Saturation (Exit) 94 % 98 % --     Rating of Perceived Exertion (Exercise) 17 13 --     Perceived Dyspnea (Exercise) 4 3 --     Duration Continue with 30 min of aerobic exercise without signs/symptoms of physical distress. Continue with 30 min of aerobic exercise without signs/symptoms of physical distress. --     Intensity THRR unchanged THRR unchanged --       Progression   Progression -- Continue to progress workloads to maintain intensity without signs/symptoms of physical distress. --       Paramedic Prescription Yes Yes --     Weight red bands red bands --     Reps  10-15 10-15 --     Time 10 Minutes 10 Minutes --       Oxygen   Oxygen Continuous Continuous --     Liters 8 6-8 --       NuStep   Level 1 1 --     Minutes 15 15 --     METs 1.3 1.6 --       Track   Laps 4 7 --     Minutes 15 15 --     METs 1.62 2.23 --       Home Exercise Plan   Plans to continue exercise at -- -- Home (comment)     Frequency -- -- Add 1 additional day to program exercise sessions.     Initial Home Exercises  Provided -- -- 09/03/22       Oxygen   Maintain Oxygen Saturation 88% or higher 88% or higher --              Exercise Comments:   Exercise Comments     Row Name 07/30/22 1215 09/03/22 1550         Exercise Comments Pt completed 1st day of exercise. She exercised 15 min on the track and recumbent elliptical. Zaelynn averaged 1.62 METs on the track and 1.2 METs on the recumbent elliptical. She performed the warmup and cooldown standing holding onto a rollator for balance. Discussed METs and how to increase METs Completed home exercise plan. Rebecca Mcintyre is performing the warmup and cooldown exercises at home. She is not doing any long bouts of aerobic exercise. I discussed starting some walking at home. I encouraged Marijayne to start walking 1 non-rehab day/wk for 3x5 min. She agreed with my recommendations. Rebecca Mcintyre seems motivated to exercise and improve her functional capacity.               Exercise Goals and Review:   Exercise Goals     Row Name 07/24/22 1234 08/02/22 0805 08/29/22 0704         Exercise Goals   Increase Physical Activity Yes Yes Yes     Intervention Develop an individualized exercise prescription for aerobic and resistive training based on initial evaluation findings, risk stratification, comorbidities and participant's personal goals.;Provide advice, education, support and counseling about physical activity/exercise needs. Develop an individualized exercise prescription for aerobic and resistive training based on initial evaluation  findings, risk stratification, comorbidities and participant's personal goals.;Provide advice, education, support and counseling about physical activity/exercise needs. Develop an individualized exercise prescription for aerobic and resistive training based on initial evaluation findings, risk stratification, comorbidities and participant's personal goals.;Provide advice, education, support and counseling about physical activity/exercise needs.     Expected Outcomes Long Term: Add in home exercise to make exercise part of routine and to increase amount of physical activity.;Short Term: Attend rehab on a regular basis to increase amount of physical activity.;Long Term: Exercising regularly at least 3-5 days a week. Long Term: Add in home exercise to make exercise part of routine and to increase amount of physical activity.;Short Term: Attend rehab on a regular basis to increase amount of physical activity.;Long Term: Exercising regularly at least 3-5 days a week. Long Term: Add in home exercise to make exercise part of routine and to increase amount of physical activity.;Short Term: Attend rehab on a regular basis to increase amount of physical activity.;Long Term: Exercising regularly at least 3-5 days a week.     Increase Strength and Stamina Yes Yes Yes     Intervention Provide advice, education, support and counseling about physical activity/exercise needs.;Develop an individualized exercise prescription for aerobic and resistive training based on initial evaluation findings, risk stratification, comorbidities and participant's personal goals. Provide advice, education, support and counseling about physical activity/exercise needs.;Develop an individualized exercise prescription for aerobic and resistive training based on initial evaluation findings, risk stratification, comorbidities and participant's personal goals. Provide advice, education, support and counseling about physical activity/exercise  needs.;Develop an individualized exercise prescription for aerobic and resistive training based on initial evaluation findings, risk stratification, comorbidities and participant's personal goals.     Expected Outcomes Short Term: Increase workloads from initial exercise prescription for resistance, speed, and METs.;Short Term: Perform resistance training exercises routinely during rehab and add in resistance training at home;Long Term: Improve cardiorespiratory fitness, muscular endurance  and strength as measured by increased METs and functional capacity ( ) Short Term: Increase workloads from initial exercise prescription for resistance, speed, and METs.;Short Term: Perform resistance training exercises routinely during rehab and add in resistance training at home;Long Term: Improve cardiorespiratory fitness, muscular endurance and strength as measured by increased METs and functional capacity ( ) Short Term: Increase workloads from initial exercise prescription for resistance, speed, and METs.;Short Term: Perform resistance training exercises routinely during rehab and add in resistance training at home;Long Term: Improve cardiorespiratory fitness, muscular endurance and strength as measured by increased METs and functional capacity ( )     Able to understand and use rate of perceived exertion (RPE) scale Yes Yes Yes     Intervention Provide education and explanation on how to use RPE scale Provide education and explanation on how to use RPE scale Provide education and explanation on how to use RPE scale     Expected Outcomes Short Term: Able to use RPE daily in rehab to express subjective intensity level;Long Term:  Able to use RPE to guide intensity level when exercising independently Short Term: Able to use RPE daily in rehab to express subjective intensity level;Long Term:  Able to use RPE to guide intensity level when exercising independently Short Term: Able to use RPE daily in rehab to express  subjective intensity level;Long Term:  Able to use RPE to guide intensity level when exercising independently     Able to understand and use Dyspnea scale Yes Yes Yes     Intervention Provide education and explanation on how to use Dyspnea scale Provide education and explanation on how to use Dyspnea scale Provide education and explanation on how to use Dyspnea scale     Expected Outcomes Short Term: Able to use Dyspnea scale daily in rehab to express subjective sense of shortness of breath during exertion;Long Term: Able to use Dyspnea scale to guide intensity level when exercising independently Short Term: Able to use Dyspnea scale daily in rehab to express subjective sense of shortness of breath during exertion;Long Term: Able to use Dyspnea scale to guide intensity level when exercising independently Short Term: Able to use Dyspnea scale daily in rehab to express subjective sense of shortness of breath during exertion;Long Term: Able to use Dyspnea scale to guide intensity level when exercising independently     Knowledge and understanding of Target Heart Rate Range (THRR) Yes Yes Yes     Intervention Provide education and explanation of THRR including how the numbers were predicted and where they are located for reference Provide education and explanation of THRR including how the numbers were predicted and where they are located for reference Provide education and explanation of THRR including how the numbers were predicted and where they are located for reference     Expected Outcomes Short Term: Able to state/look up THRR;Short Term: Able to use daily as guideline for intensity in rehab;Long Term: Able to use THRR to govern intensity when exercising independently Short Term: Able to state/look up THRR;Short Term: Able to use daily as guideline for intensity in rehab;Long Term: Able to use THRR to govern intensity when exercising independently Short Term: Able to state/look up THRR;Short Term: Able to  use daily as guideline for intensity in rehab;Long Term: Able to use THRR to govern intensity when exercising independently     Understanding of Exercise Prescription Yes Yes Yes     Intervention Provide education, explanation, and written materials on patient's individual exercise prescription Provide education, explanation, and written  materials on patient's individual exercise prescription Provide education, explanation, and written materials on patient's individual exercise prescription     Expected Outcomes Short Term: Able to explain program exercise prescription;Long Term: Able to explain home exercise prescription to exercise independently Short Term: Able to explain program exercise prescription;Long Term: Able to explain home exercise prescription to exercise independently Short Term: Able to explain program exercise prescription;Long Term: Able to explain home exercise prescription to exercise independently              Exercise Goals Re-Evaluation :  Exercise Goals Re-Evaluation     Row Name 08/02/22 0806 08/29/22 0704           Exercise Goal Re-Evaluation   Exercise Goals Review Increase Physical Activity;Able to understand and use Dyspnea scale;Understanding of Exercise Prescription;Increase Strength and Stamina;Knowledge and understanding of Target Heart Rate Range (THRR);Able to understand and use rate of perceived exertion (RPE) scale Increase Physical Activity;Able to understand and use Dyspnea scale;Understanding of Exercise Prescription;Increase Strength and Stamina;Knowledge and understanding of Target Heart Rate Range (THRR);Able to understand and use rate of perceived exertion (RPE) scale      Comments Rebecca Mcintyre has completed 2 exercise sessions. Rebecca Mcintyre exercises for 15 min on the track and Nustep. She averages 1.62 METs on the track and 1.3 METs at level 1 on the Nustep. She performs the warmup and cooldown standing holding onto a rollator for balance. Rebecca Mcintyre is very deconditioned as  she admits to not exercising since the last time she was in rehab. Rebecca Mcintyre was originally exercising on the recumbent elliptical. The recumbent elliptical seemed to be difficult for her, so she was moved to the Nustep. Rebecca Mcintyre seemed to tolerate the Nustep better. Will continue to monitor and progress as able. Rebecca Mcintyre has completed 6 exercise sessions. Rebecca Mcintyre exercises for 15 min on the track and Nustep. She averages 1.77 METs on the track and 1.3 METs at level 1 on the Nustep. She performs the warmup and cooldown standing holding onto a rollator for balance. Rebecca Mcintyre iis still deconditioned. She has not progressed in track laps or level on the Nustep. Maicee has also been absent for the past week due to illness.  Dannell remaines positive despite her deconditioning. Will continue to monitor and progress as able.      Expected Outcomes Through exercise at rehab and at home, the patient will decrease shortness of breath with daily activities and feel confident in carrying out an exercise regime at home. Through exercise at rehab and at home, the patient will decrease shortness of breath with daily activities and feel confident in carrying out an exercise regime at home.               Discharge Exercise Prescription (Final Exercise Prescription Changes):  Exercise Prescription Changes - 09/03/22 1500       Home Exercise Plan   Plans to continue exercise at Home (comment)    Frequency Add 1 additional day to program exercise sessions.    Initial Home Exercises Provided 09/03/22             Nutrition:  Target Goals: Understanding of nutrition guidelines, daily intake of sodium 1500mg , cholesterol 200mg , calories 30% from fat and 7% or less from saturated fats, daily to have 5 or more servings of fruits and vegetables.  Biometrics:    Nutrition Therapy Plan and Nutrition Goals:  Nutrition Therapy & Goals - 08/27/22 1152       Nutrition Therapy   Diet Heart Healthy Diet  Drug/Food Interactions  Statins/Certain Fruits      Personal Nutrition Goals   Nutrition Goal Patient to improve diet quality by using the plate method as a daily guide for meal planning to include lean protein/plant protein, fruits, vegetables, whole grains, nonfat dairy as part of well balanced diet    Personal Goal #2 Patient to identify strategies for weight loss of 0.5-2.0# per week.    Comments Goals in action. Shanasia has not attended pulmonary rehab since 3/21 due to not feeling well. She continues Mounjaro to aid with weight loss; she is down 3# since starting with our program. We have discussed multiple strategies for weight loss and benefit of a low sodium diet; she continues to follow a high protein/low carbohydrate diet.  Rebecca Mcintyre will benefit from participation in pulmonary rehab for nutrition, exercise, and lifestyle modification.      Intervention Plan   Intervention Prescribe, educate and counsel regarding individualized specific dietary modifications aiming towards targeted core components such as weight, hypertension, lipid management, diabetes, heart failure and other comorbidities.;Nutrition handout(s) given to patient.    Expected Outcomes Short Term Goal: Understand basic principles of dietary content, such as calories, fat, sodium, cholesterol and nutrients.;Long Term Goal: Adherence to prescribed nutrition plan.             Nutrition Assessments:  MEDIFICTS Score Key: ?70 Need to make dietary changes  40-70 Heart Healthy Diet ? 40 Therapeutic Level Cholesterol Diet   Picture Your Plate Scores: <96 Unhealthy dietary pattern with much room for improvement. 41-50 Dietary pattern unlikely to meet recommendations for good health and room for improvement. 51-60 More healthful dietary pattern, with some room for improvement.  >60 Healthy dietary pattern, although there may be some specific behaviors that could be improved.    Nutrition Goals Re-Evaluation:  Nutrition Goals Re-Evaluation     Row  Name 07/30/22 1159 08/27/22 1152           Goals   Current Weight 226 lb 13.7 oz (102.9 kg) 224 lb 3.3 oz (101.7 kg)  weight from last attended session on 3/21      Comment triglycerides 229, A1c 6.4 No new labs; most recent labs triglycerides 229, A1c 6.4      Expected Outcome Rebecca Mcintyre has previously completed pulmonary rehab ~4 years ago. She is motivated to lose weight. She recently started [facility] to aid with weight loss. Leanny will benefit from participation in pulmonary rehab for nutrition, exercise, and lifestyle modification. Goals in action. Madysyn has not attended pulmonary rehab since 3/21 due to not feeling well. She continues Mounjaro to aid with weight loss; she is down 3# since starting with our program. We have discussed multiple strategies for weight loss and benefit of a low sodium diet; she continues to follow a high protein/low carbohydrate diet. Rebecca Mcintyre will benefit from participation in pulmonary rehab for nutrition, exercise, and lifestyle modification.               Nutrition Goals Discharge (Final Nutrition Goals Re-Evaluation):  Nutrition Goals Re-Evaluation - 08/27/22 1152       Goals   Current Weight 224 lb 3.3 oz (101.7 kg)   weight from last attended session on 3/21   Comment No new labs; most recent labs triglycerides 229, A1c 6.4    Expected Outcome Goals in action. Rebecca Mcintyre has not attended pulmonary rehab since 3/21 due to not feeling well. She continues Mounjaro to aid with weight loss; she is down 3# since starting with our program.  We have discussed multiple strategies for weight loss and benefit of a low sodium diet; she continues to follow a high protein/low carbohydrate diet. Delories will benefit from participation in pulmonary rehab for nutrition, exercise, and lifestyle modification.             Psychosocial: Target Goals: Acknowledge presence or absence of significant depression and/or stress, maximize coping skills, provide positive support system. Participant is  able to verbalize types and ability to use techniques and skills needed for reducing stress and depression.  Initial Review & Psychosocial Screening:  Initial Psych Review & Screening - 07/24/22 1056       Initial Review   Current issues with None Identified      Family Dynamics   Good Support System? Yes      Barriers   Psychosocial barriers to participate in program There are no identifiable barriers or psychosocial needs.      Screening Interventions   Interventions Encouraged to exercise             Quality of Life Scores:  Scores of 19 and below usually indicate a poorer quality of life in these areas.  A difference of  2-3 points is a clinically meaningful difference.  A difference of 2-3 points in the total score of the Quality of Life Index has been associated with significant improvement in overall quality of life, self-image, physical symptoms, and general health in studies assessing change in quality of life.  PHQ-9: Review Flowsheet  More data exists      07/24/2022 04/15/2022 04/03/2022 12/24/2021 05/22/2021  Depression screen PHQ 2/9  Decreased Interest 0 0 0 0 0  Down, Depressed, Hopeless 0 0 0 0 0  PHQ - 2 Score 0 0 0 0 0  Altered sleeping 0 0 - - 0  Tired, decreased energy 0 1 - - 0  Change in appetite 0 0 - - 0  Feeling bad or failure about yourself  0 0 - - 0  Trouble concentrating 0 0 - - 0  Moving slowly or fidgety/restless 0 0 - - 0  Suicidal thoughts 0 0 - - 0  PHQ-9 Score 0 1 - - 0  Difficult doing work/chores - Somewhat difficult - - -   Interpretation of Total Score  Total Score Depression Severity:  1-4 = Minimal depression, 5-9 = Mild depression, 10-14 = Moderate depression, 15-19 = Moderately severe depression, 20-27 = Severe depression   Psychosocial Evaluation and Intervention:  Psychosocial Evaluation - 07/24/22 1056       Psychosocial Evaluation & Interventions   Interventions Encouraged to exercise with the program and follow  exercise prescription    Comments Rebecca Mcintyre currently denies any psychosocial barriers or concerns    Expected Outcomes For Rebecca Mcintyre to participate in pulm rehab without any psychosocial barriers or concerns    Continue Psychosocial Services  No Follow up required             Psychosocial Re-Evaluation:  Psychosocial Re-Evaluation     Row Name 07/31/22 1146 08/28/22 0929           Psychosocial Re-Evaluation   Current issues with None Identified None Identified      Comments Rebecca Mcintyre remains to have no barriers or psychosocial concerns at this time. Rebecca Mcintyre remains to have no barriers or psychosocial concerns at this time.      Expected Outcomes Caliegh will remain free of any psychosocial concerns. Rebecca Mcintyre will remain free of any psychosocial concerns.  Interventions Encouraged to attend Pulmonary Rehabilitation for the exercise Encouraged to attend Pulmonary Rehabilitation for the exercise      Continue Psychosocial Services  No Follow up required No Follow up required               Psychosocial Discharge (Final Psychosocial Re-Evaluation):  Psychosocial Re-Evaluation - 08/28/22 0929       Psychosocial Re-Evaluation   Current issues with None Identified    Comments Rebecca Mcintyre remains to have no barriers or psychosocial concerns at this time.    Expected Outcomes Rebecca Mcintyre will remain free of any psychosocial concerns.    Interventions Encouraged to attend Pulmonary Rehabilitation for the exercise    Continue Psychosocial Services  No Follow up required             Education: Education Goals: Education classes will be provided on a weekly basis, covering required topics. Participant will state understanding/return demonstration of topics presented.  Learning Barriers/Preferences:  Learning Barriers/Preferences - 07/24/22 1057       Learning Barriers/Preferences   Learning Barriers Hearing    Learning Preferences Individual Instruction;Written Material             Education  Topics: Introduction to Pulmonary Rehab Group instruction provided by PowerPoint, verbal discussion, and written material to support subject matter. Instructor reviews what Pulmonary Rehab is, the purpose of the program, and how patients are referred.     Know Your Numbers Group instruction that is supported by a PowerPoint presentation. Instructor discusses importance of knowing and understanding resting, exercise, and post-exercise oxygen saturation, heart rate, and blood pressure. Oxygen saturation, heart rate, blood pressure, rating of perceived exertion, and dyspnea are reviewed along with a normal range for these values.    Exercise for the Pulmonary Patient Group instruction that is supported by a PowerPoint presentation. Instructor discusses benefits of exercise, core components of exercise, frequency, duration, and intensity of an exercise routine, importance of utilizing pulse oximetry during exercise, safety while exercising, and options of places to exercise outside of rehab.       MET Level  Group instruction provided by PowerPoint, verbal discussion, and written material to support subject matter. Instructor reviews what METs are and how to increase METs.    Pulmonary Medications Verbally interactive group education provided by instructor with focus on inhaled medications and proper administration.   Anatomy and Physiology of the Respiratory System Group instruction provided by PowerPoint, verbal discussion, and written material to support subject matter. Instructor reviews respiratory cycle and anatomical components of the respiratory system and their functions. Instructor also reviews differences in obstructive and restrictive respiratory diseases with examples of each.    Oxygen Safety Group instruction provided by PowerPoint, verbal discussion, and written material to support subject matter. There is an overview of "What is Oxygen" and "Why do we need it".  Instructor  also reviews how to create a safe environment for oxygen use, the importance of using oxygen as prescribed, and the risks of noncompliance. There is a brief discussion on traveling with oxygen and resources the patient may utilize.   Oxygen Use Group instruction provided by PowerPoint, verbal discussion, and written material to discuss how supplemental oxygen is prescribed and different types of oxygen supply systems. Resources for more information are provided.    Breathing Techniques Group instruction that is supported by demonstration and informational handouts. Instructor discusses the benefits of pursed lip and diaphragmatic breathing and detailed demonstration on how to perform both.     Risk Factor  Reduction Group instruction that is supported by a PowerPoint presentation. Instructor discusses the definition of a risk factor, different risk factors for pulmonary disease, and how the heart and lungs work together.   MD Day A group question and answer session with a medical doctor that allows participants to ask questions that relate to their pulmonary disease state.   Nutrition for the Pulmonary Patient Group instruction provided by PowerPoint slides, verbal discussion, and written materials to support subject matter. The instructor gives an explanation and review of healthy diet recommendations, which includes a discussion on weight management, recommendations for fruit and vegetable consumption, as well as protein, fluid, caffeine, fiber, sodium, sugar, and alcohol. Tips for eating when patients are short of breath are discussed.    Other Education Group or individual verbal, written, or video instructions that support the educational goals of the pulmonary rehab program.    Knowledge Questionnaire Score:   Core Components/Risk Factors/Patient Goals at Admission:  Personal Goals and Risk Factors at Admission - 07/24/22 1057       Core Components/Risk Factors/Patient Goals on  Admission    Weight Management Weight Loss    Improve shortness of breath with ADL's Yes    Intervention Provide education, individualized exercise plan and daily activity instruction to help decrease symptoms of SOB with activities of daily living.    Expected Outcomes Short Term: Improve cardiorespiratory fitness to achieve a reduction of symptoms when performing ADLs;Long Term: Be able to perform more ADLs without symptoms or delay the onset of symptoms    Increase knowledge of respiratory medications and ability to use respiratory devices properly  Yes    Intervention Provide education and demonstration as needed of appropriate use of medications, inhalers, and oxygen therapy.    Expected Outcomes Short Term: Achieves understanding of medications use. Understands that oxygen is a medication prescribed by physician. Demonstrates appropriate use of inhaler and oxygen therapy.;Long Term: Maintain appropriate use of medications, inhalers, and oxygen therapy.             Core Components/Risk Factors/Patient Goals Review:   Goals and Risk Factor Review     Row Name 07/31/22 1147 08/28/22 0929           Core Components/Risk Factors/Patient Goals Review   Personal Goals Review Develop more efficient breathing techniques such as purse lipped breathing and diaphragmatic breathing and practicing self-pacing with activity.;Increase knowledge of respiratory medications and ability to use respiratory devices properly.;Improve shortness of breath with ADL's Develop more efficient breathing techniques such as purse lipped breathing and diaphragmatic breathing and practicing self-pacing with activity.;Increase knowledge of respiratory medications and ability to use respiratory devices properly.;Improve shortness of breath with ADL's;Weight Management/Obesity      Review Rebecca Mcintyre has attended 1 PR class so far. She is currently exercising on the track and level 1 on the Octane. We will continue any progress  she continues to make. Rebecca Mcintyre has completed 6 PR sessions so far. She is currently exercising by walking the track and exercising on the Nustep. She has made minimal progress so far. Her oxygen saturations have been stable on 6L. She is practicing pursed lip breathing. Rebecca Mcintyre has lost about 5 pounds since starting a new medication and exercising. She knows how to report her Rate of Perceived Exertion and her Dyspnea scale to staff. We will continue to assess any progress.      Expected Outcomes See admission goals See admission goals  Core Components/Risk Factors/Patient Goals at Discharge (Final Review):   Goals and Risk Factor Review - 08/28/22 0929       Core Components/Risk Factors/Patient Goals Review   Personal Goals Review Develop more efficient breathing techniques such as purse lipped breathing and diaphragmatic breathing and practicing self-pacing with activity.;Increase knowledge of respiratory medications and ability to use respiratory devices properly.;Improve shortness of breath with ADL's;Weight Management/Obesity    Review Rebecca Mcintyre has completed 6 PR sessions so far. She is currently exercising by walking the track and exercising on the Nustep. She has made minimal progress so far. Her oxygen saturations have been stable on 6L. She is practicing pursed lip breathing. Rebecca Mcintyre has lost about 5 pounds since starting a new medication and exercising. She knows how to report her Rate of Perceived Exertion and her Dyspnea scale to staff. We will continue to assess any progress.    Expected Outcomes See admission goals             ITP Comments:   Comments: Dr. Mechele Collin is Medical Director for Pulmonary Rehab at [facility].

## 2022-09-05 ENCOUNTER — Encounter (HOSPITAL_COMMUNITY)
Admission: RE | Admit: 2022-09-05 | Discharge: 2022-09-05 | Disposition: A | Discharge status: Home / Self Care | Payer: Medicare Other | Source: Ambulatory Visit | Attending: Pulmonary Disease | Admitting: Pulmonary Disease

## 2022-09-05 ENCOUNTER — Ambulatory Visit (HOSPITAL_BASED_OUTPATIENT_CLINIC_OR_DEPARTMENT_OTHER): Payer: Medicare Other | Admitting: Pulmonary Disease

## 2022-09-05 ENCOUNTER — Encounter (HOSPITAL_BASED_OUTPATIENT_CLINIC_OR_DEPARTMENT_OTHER): Payer: Self-pay | Admitting: Pulmonary Disease

## 2022-09-05 VITALS — BP 114/62 | HR 91 | Temp 98.6°F | Ht 63.0 in | Wt 216.6 lb

## 2022-09-05 DIAGNOSIS — J449 Chronic obstructive pulmonary disease, unspecified: Secondary | ICD-10-CM

## 2022-09-05 DIAGNOSIS — J432 Centrilobular emphysema: Secondary | ICD-10-CM | POA: Diagnosis not present

## 2022-09-05 DIAGNOSIS — J9611 Chronic respiratory failure with hypoxia: Secondary | ICD-10-CM

## 2022-09-05 NOTE — Patient Instructions (Signed)
Xsample of yupelri  X Rx for oximizer

## 2022-09-05 NOTE — Progress Notes (Signed)
   Subjective:    Patient ID: Rebecca Mcintyre, female    DOB: [birth year], 77 y.o.   MRN: [id number]  HPI  77 yo ex-smoker for follow-up of COPD , chronic hypoxic /hypercarbic respiratory failure and pulmonary hypertension Identical twin of Bob Chhay ,my patient who passed away   PMH - CAD, diastolic HF,  dyslipidemia, obesity  - hospitalized 10/2018 for acute on chronic hypercarbic respiratory failure -slow to resolve COPD exacerbation 01/2020 - hospitalized 11/2020  for right lower extremity injury, course complicated by acute diastolic heart failure and encephalopathy due to narcotics elevated troponin  -hosp 04/2022 for flare   53-month follow-up visit After hospitalization 04/2022, she was switched to nebulizer bronchodilators She obtained POC  NIV , she was started on BiPAP   She was referred for Zephyr valve to [facility], I reviewed testing there and consultation She was started on pulm rehab and requested walker with a seat but insurance does not cover until 2025 07/24/22 during her walk test she required an increase in her O2 from 6-8L >> Oxymizer provided  She has been started on Mounjaro and has lost 20 pounds.  She really likes pulmonary rehab.  Her mood is improved.  The whole experience is uplifting She is able to use 4 L oxygen at rest and needs up to 6 to 8 L while walking  She is unable to affordyupelri, about $200 when she checked with both [facility] and [facility] Significant tests/ events reviewed  ABG 05/2022 7.36/49/69/93% ABG 01/2022 7.4 7/57/65 on 3 L oxygen ABG 02/2020 7.4 2/44/53/87%   PFTs 05/2022 FEV1 0.73 /37%-improved to 0.89 postbronchodilator , DLCO 3.43  PFTs 12/29/2018 FVC 2.31 (82), FEV1 1.20 (56), ratio 52, no bronchodilatory response, DLCO 51 %   10/2018 CTA Chest- No PE; cardiomegaly, emphysema       NPSG 12/13/18  no obstructive sleep apnea, min O2 89%. Periodic limb movements did occur during sleep  Review of Systems neg for any significant sore throat,  dysphagia, itching, sneezing, nasal congestion or excess/ purulent secretions, fever, chills, sweats, unintended wt loss, pleuritic or exertional cp, hempoptysis, orthopnea pnd or change in chronic leg swelling. Also denies presyncope, palpitations, heartburn, abdominal pain, nausea, vomiting, diarrhea or change in bowel or urinary habits, dysuria,hematuria, rash, arthralgias, visual complaints, headache, numbness weakness or ataxia.     Objective:   Physical Exam  Gen. Pleasant, obese, in no distress ENT - no lesions, no post nasal drip Neck: No JVD, no thyromegaly, no carotid bruits Lungs: no use of accessory muscles, no dullness to percussion, decreased without rales or rhonchi  Cardiovascular: Rhythm regular, heart sounds  normal, no murmurs or gallops, no peripheral edema Musculoskeletal: No deformities, no cyanosis or clubbing , no tremors       Assessment & Plan:

## 2022-09-05 NOTE — Assessment & Plan Note (Signed)
Continue current regimen of budesonide and Brovana. She is unfortunately not able to afford Yupelri. Use albuterol nebs for rescue

## 2022-09-05 NOTE — Assessment & Plan Note (Signed)
Continue NIV during sleep. Repeat ABG shows improvement in hypercarbia. Continue oxygen to 4 L at rest and 6 -8L during rehab.

## 2022-09-05 NOTE — Progress Notes (Signed)
Daily Session Note  Patient Details  Name: Rebecca Mcintyre MRN: [id number] Date of Birth: 04/28/1946 Referring Provider:   Doristine Devoid Pulmonary Rehab Walk Test from 07/24/2022 in [facility]  Referring Provider Vassie Loll       Encounter Date: 09/05/2022  Check In:  Session Check In - 09/05/22 1155       Check-In   Supervising physician immediately available to respond to emergencies [facility] MD immediately available    Physician(s) Carlos Levering, NP    Location [facility]    Staff Present Essie Hart, RN, Doris Cheadle, MS, ACSM-CEP, Exercise Physiologist;Samantha Belarus, RD, LDN;Carlette Les Pou, RN, BSN;Randi Reeve BS, ACSM-CEP, Exercise Physiologist;Casey Katrinka Blazing, RT    Virtual Visit No    Medication changes reported     No    Fall or balance concerns reported    No    Tobacco Cessation No Change    Warm-up and Cool-down Performed as group-led instruction    Resistance Training Performed Yes    VAD Patient? No    PAD/SET Patient? No      Pain Assessment   Currently in Pain? No/denies    Pain Score 0-No pain    Multiple Pain Sites No             Capillary Blood Glucose: No results found for this or any previous visit (from the past 24 hour(s)).    Social History   Tobacco Use  Smoking Status Former   Packs/day: 1.00   Years: 39.00   Additional pack years: 0.00   Total pack years: 39.00   Types: Cigarettes   Quit date: 11/04/2018   Years since quitting: 3.8  Smokeless Tobacco Never    Goals Met:  Proper associated with RPD/PD & O2 Sat Independence with exercise equipment Exercise tolerated well No report of concerns or symptoms today Strength training completed today  Goals Unmet:  Not Applicable  Comments: Service time is from 1012 to 1150.    Dr. Mechele Collin is Medical Director for Pulmonary Rehab at [facility].

## 2022-09-09 DIAGNOSIS — J9612 Chronic respiratory failure with hypercapnia: Secondary | ICD-10-CM | POA: Diagnosis not present

## 2022-09-09 DIAGNOSIS — J449 Chronic obstructive pulmonary disease, unspecified: Secondary | ICD-10-CM | POA: Diagnosis not present

## 2022-09-10 ENCOUNTER — Encounter (HOSPITAL_COMMUNITY)
Admission: RE | Admit: 2022-09-10 | Discharge: 2022-09-10 | Disposition: A | Discharge status: Home / Self Care | Payer: Medicare Other | Source: Ambulatory Visit | Attending: Pulmonary Disease | Admitting: Pulmonary Disease

## 2022-09-10 VITALS — Wt 216.9 lb

## 2022-09-10 DIAGNOSIS — J449 Chronic obstructive pulmonary disease, unspecified: Secondary | ICD-10-CM

## 2022-09-10 NOTE — Progress Notes (Signed)
Daily Session Note  Patient Details  Name: Rebecca Mcintyre MRN: [id number] Date of Birth: 08-06-1945 Referring Provider:   Doristine Devoid Pulmonary Rehab Walk Test from 07/24/2022 in [facility], [facility], [facility]  Referring Provider Vassie Loll       Encounter Date: 09/10/2022  Check In:  Session Check In - 09/10/22 1615       Check-In   Supervising physician immediately available to respond to emergencies [facility] immediately available    Physician(s) Jari Favre, PA    [facility]    Staff Present Essie Hart, RN, Doris Cheadle, MS, ACSM-CEP, Exercise Physiologist;Randi Dionisio Paschal, ACSM-CEP, Exercise Physiologist;Casey Katrinka Blazing, RT    Virtual Visit No    Medication changes reported     No    Fall or balance concerns reported    No    Tobacco Cessation No Change    Warm-up and Cool-down Performed as group-led instruction    Resistance Training Performed Yes    VAD Patient? No    PAD/SET Patient? No      Pain Assessment   Currently in Pain? No/denies    Pain Score 0-No pain    Multiple Pain Sites No             Capillary Blood Glucose: No results found for this or any previous visit (from the past 24 hour(s)).   Exercise Prescription Changes - 09/10/22 1600       Response to Exercise   Blood Pressure (Admit) 128/60    Blood Pressure (Exercise) 142/78    Blood Pressure (Exit) 94/58    Heart Rate (Admit) 90 bpm    Heart Rate (Exercise) 105 bpm    Heart Rate (Exit) 94 bpm    Oxygen Saturation (Admit) 94 %    Oxygen Saturation (Exercise) 92 %    Oxygen Saturation (Exit) 97 %    Rating of Perceived Exertion (Exercise) 15    Perceived Dyspnea (Exercise) 3    Duration Continue with 30 min of aerobic exercise without signs/symptoms of physical distress.    Intensity THRR unchanged      Progression   Progression Continue to progress workloads to maintain intensity without signs/symptoms of physical distress.       Resistance Training   Training Prescription Yes    Weight red bands    Reps 10-15    Time 10 Minutes      Oxygen   Oxygen Continuous    Liters 8      NuStep   Level 2    Minutes 15    METs 2      Track   Laps 6    Minutes 15    METs 1.92      Oxygen   Maintain Oxygen Saturation 88% or higher             Social History   Tobacco Use  Smoking Status Former   Packs/day: 1.00   Years: 39.00   Additional pack years: 0.00   Total pack years: 39.00   Types: Cigarettes   Quit date: 11/04/2018   Years since quitting: 3.8  Smokeless Tobacco Never    Goals Met:  Proper associated with RPD/PD & O2 Sat Exercise tolerated well No report of concerns or symptoms today Strength training completed today  Goals Unmet:  Not Applicable  Comments: Service time is from 1015 to 1150.    Dr. Mechele Collin is Medical Director for Pulmonary Rehab  at [facility].

## 2022-09-12 ENCOUNTER — Encounter (HOSPITAL_COMMUNITY)
Admission: RE | Admit: 2022-09-12 | Discharge: 2022-09-12 | Disposition: A | Discharge status: Home / Self Care | Payer: Medicare Other | Source: Ambulatory Visit | Attending: Pulmonary Disease | Admitting: Pulmonary Disease

## 2022-09-12 DIAGNOSIS — J449 Chronic obstructive pulmonary disease, unspecified: Secondary | ICD-10-CM | POA: Diagnosis not present

## 2022-09-12 NOTE — Progress Notes (Signed)
Daily Session Note  Patient Details  Name: Rebecca Mcintyre MRN: [id number] Date of Birth: [birth year] Referring Provider:   Doristine Devoid Pulmonary Rehab Walk Test from 07/24/2022 in [facility], [facility], [facility]  Referring Provider Vassie Loll       Encounter Date: 09/12/2022  Check In:  Session Check In - 09/12/22 1225       Check-In   Supervising physician immediately available to respond to emergencies [facility] immediately available    Physician(s) Eligha Bridegroom, NP    [facility]    Staff Present Samantha Belarus, RD, Dutch Gray, RN, Doris Cheadle, MS, ACSM-CEP, Exercise Physiologist;Randi Dionisio Paschal, ACSM-CEP, Exercise Physiologist;Cortez Flippen Katrinka Blazing, RT    Virtual Visit No    Medication changes reported     No    Fall or balance concerns reported    No    Tobacco Cessation No Change    Warm-up and Cool-down Performed as group-led instruction    Resistance Training Performed Yes    VAD Patient? No    PAD/SET Patient? No      Pain Assessment   Currently in Pain? No/denies    Multiple Pain Sites No             Capillary Blood Glucose: No results found for this or any previous visit (from the past 24 hour(s)).    Social History   Tobacco Use  Smoking Status Former   Packs/day: 1.00   Years: 39.00   Additional pack years: 0.00   Total pack years: 39.00   Types: Cigarettes   Quit date: 11/04/2018   Years since quitting: 3.8  Smokeless Tobacco Never    Goals Met:  Proper associated with RPD/PD & O2 Sat Independence with exercise equipment Exercise tolerated well No report of concerns or symptoms today Strength training completed today  Goals Unmet:  Not Applicable  Comments: Service time is from 1014 to 1150.    Dr. Mechele Collin is Medical Director for Pulmonary Rehab at [facility].

## 2022-09-13 ENCOUNTER — Other Ambulatory Visit (HOSPITAL_COMMUNITY): Payer: Self-pay

## 2022-09-13 MED ORDER — MOUNJARO 10 MG/0.5ML ~~LOC~~ SOAJ
10.0000 mg | SUBCUTANEOUS | 1 refills | Status: DC
  Filled 2022-09-13 (×2): qty 2, 28d supply, fill #0
  Filled 2022-10-14 – 2022-10-22 (×2): qty 2, 28d supply, fill #1

## 2022-09-13 NOTE — Telephone Encounter (Signed)
You're welcome. I asked them to deliver it again

## 2022-09-13 NOTE — Addendum Note (Signed)
Addended by: Cheree Ditto on: 09/13/2022 09:24 AM   Modules accepted: Orders

## 2022-09-16 ENCOUNTER — Other Ambulatory Visit: Payer: Self-pay

## 2022-09-16 ENCOUNTER — Other Ambulatory Visit: Payer: Self-pay | Admitting: Family

## 2022-09-17 ENCOUNTER — Encounter (HOSPITAL_COMMUNITY)
Admission: RE | Admit: 2022-09-17 | Discharge: 2022-09-17 | Disposition: A | Discharge status: Home / Self Care | Payer: Medicare Other | Source: Ambulatory Visit | Attending: Pulmonary Disease | Admitting: Pulmonary Disease

## 2022-09-17 DIAGNOSIS — J449 Chronic obstructive pulmonary disease, unspecified: Secondary | ICD-10-CM

## 2022-09-17 NOTE — Progress Notes (Signed)
Daily Session Note  Patient Details  Name: Rebecca Mcintyre MRN: [id number] Date of Birth: [birth year] Referring Provider:   Doristine Devoid Pulmonary Rehab Walk Test from 07/24/2022 in [facility] for Heart, Vascular, & Lung [facility]  Referring Provider Vassie Loll       Encounter Date: 09/17/2022  Check In:  Session Check In - 09/17/22 1207       Check-In   Supervising physician immediately available to respond to emergencies [facility] immediately available    Physician(s) Eligha Bridegroom, NP    [facility]    Staff Present Samantha Belarus, RD, Dutch Gray, RN, BSN;Randi Reeve BS, ACSM-CEP, Exercise Physiologist;Kaylee Earlene Plater, MS, ACSM-CEP, Exercise Physiologist;Zilah Villaflor Katrinka Blazing, RT    Virtual Visit No    Medication changes reported     No    Fall or balance concerns reported    No    Tobacco Cessation No Change    Warm-up and Cool-down Performed as group-led instruction    Resistance Training Performed Yes    VAD Patient? No    PAD/SET Patient? No      Pain Assessment   Currently in Pain? No/denies    Multiple Pain Sites No             Capillary Blood Glucose: No results found for this or any previous visit (from the past 24 hour(s)).    Social History   Tobacco Use  Smoking Status Former   Packs/day: 1.00   Years: 39.00   Additional pack years: 0.00   Total pack years: 39.00   Types: Cigarettes   Quit date: 11/04/2018   Years since quitting: 3.8  Smokeless Tobacco Never    Goals Met:  Proper associated with RPD/PD & O2 Sat Independence with exercise equipment Exercise tolerated well No report of concerns or symptoms today Strength training completed today  Goals Unmet:  Not Applicable  Comments: Service time is from 1007 to 1150.    Dr. Mechele Collin is Medical Director for Pulmonary Rehab at [facility].

## 2022-09-19 ENCOUNTER — Encounter (HOSPITAL_COMMUNITY)
Admission: RE | Admit: 2022-09-19 | Discharge: 2022-09-19 | Disposition: A | Discharge status: Home / Self Care | Payer: Medicare Other | Source: Ambulatory Visit | Attending: Pulmonary Disease | Admitting: Pulmonary Disease

## 2022-09-19 DIAGNOSIS — J449 Chronic obstructive pulmonary disease, unspecified: Secondary | ICD-10-CM

## 2022-09-19 NOTE — Progress Notes (Signed)
Daily Session Note  Patient Details  Name: Rebecca Mcintyre MRN: [id number] Date of Birth: 1945/08/25 Referring Provider:   Doristine Devoid Pulmonary Rehab Walk Test from 07/24/2022 in [facility] for Heart, Vascular, & Lung [facility]  Referring Provider Vassie Loll       Encounter Date: 09/19/2022  Check In:  Session Check In - 09/19/22 1219       Check-In   Supervising physician immediately available to respond to emergencies [facility] immediately available    Physician(s) Bernadene Person, NP    [facility]    Staff Present Samantha Belarus, RD, Dutch Gray, RN, BSN;Randi Reeve BS, ACSM-CEP, Exercise Physiologist;Kaylee Earlene Plater, MS, ACSM-CEP, Exercise Physiologist;Shynia Daleo Katrinka Blazing, RT    Virtual Visit No    Medication changes reported     No    Fall or balance concerns reported    No    Tobacco Cessation No Change    Warm-up and Cool-down Performed as group-led instruction    Resistance Training Performed Yes    VAD Patient? No    PAD/SET Patient? No      Pain Assessment   Currently in Pain? No/denies    Pain Score 0-No pain    Multiple Pain Sites No             Capillary Blood Glucose: No results found for this or any previous visit (from the past 24 hour(s)).    Social History   Tobacco Use  Smoking Status Former   Packs/day: 1.00   Years: 39.00   Additional pack years: 0.00   Total pack years: 39.00   Types: Cigarettes   Quit date: 11/04/2018   Years since quitting: 3.8  Smokeless Tobacco Never    Goals Met:  Proper associated with RPD/PD & O2 Sat Independence with exercise equipment Exercise tolerated well No report of concerns or symptoms today Strength training completed today  Goals Unmet:  Not Applicable  Comments: Service time is from 1015 to 1154.    Dr. Mechele Collin is Medical Director for Pulmonary Rehab at [facility].

## 2022-09-20 ENCOUNTER — Other Ambulatory Visit: Payer: Self-pay | Admitting: Family

## 2022-09-23 ENCOUNTER — Other Ambulatory Visit (HOSPITAL_COMMUNITY): Payer: Self-pay

## 2022-09-24 ENCOUNTER — Encounter (HOSPITAL_COMMUNITY)
Admission: RE | Admit: 2022-09-24 | Discharge: 2022-09-24 | Disposition: A | Discharge status: Home / Self Care | Payer: Medicare Other | Source: Ambulatory Visit | Attending: Pulmonary Disease | Admitting: Pulmonary Disease

## 2022-09-24 VITALS — Wt 211.2 lb

## 2022-09-24 DIAGNOSIS — J449 Chronic obstructive pulmonary disease, unspecified: Secondary | ICD-10-CM | POA: Diagnosis not present

## 2022-09-24 DIAGNOSIS — C44229 Squamous cell carcinoma of skin of left ear and external auricular canal: Secondary | ICD-10-CM | POA: Diagnosis not present

## 2022-09-24 DIAGNOSIS — L929 Granulomatous disorder of the skin and subcutaneous tissue, unspecified: Secondary | ICD-10-CM | POA: Diagnosis not present

## 2022-09-24 NOTE — Progress Notes (Signed)
Daily Session Note  Patient Details  Name: Rebecca Mcintyre MRN: [id number] Date of Birth: [birth year] Referring Provider:   Doristine Devoid Pulmonary Rehab Walk Test from 07/24/2022 in [facility] for Heart, Vascular, & Lung Health  Referring Provider Vassie Loll       Encounter Date: 09/24/2022  Check In:  Session Check In - 09/24/22 1153       Check-In   Supervising physician immediately available to respond to emergencies [facility] immediately available    Physician(s) Robin Searing, NP    [facility]    Staff Present Samantha Belarus, RD, Dutch Gray, RN, BSN;Scotty Pinder Idelle Crouch BS, ACSM-CEP, Exercise Physiologist;Kaylee Earlene Plater, MS, ACSM-CEP, Exercise Physiologist;Casey Katrinka Blazing, RT    Virtual Visit No    Medication changes reported     No    Fall or balance concerns reported    No    Tobacco Cessation No Change    Warm-up and Cool-down Performed as group-led instruction    Resistance Training Performed Yes    VAD Patient? No    PAD/SET Patient? No      Pain Assessment   Currently in Pain? No/denies    Pain Score 0-No pain    Multiple Pain Sites No             Capillary Blood Glucose: No results found for this or any previous visit (from the past 24 hour(s)).   Exercise Prescription Changes - 09/24/22 1200       Response to Exercise   Blood Pressure (Admit) 122/60    Blood Pressure (Exercise) 118/64    Blood Pressure (Exit) 106/52    Heart Rate (Admit) 96 bpm    Heart Rate (Exercise) 109 bpm    Heart Rate (Exit) 94 bpm    Oxygen Saturation (Admit) 93 %    Oxygen Saturation (Exercise) 92 %    Oxygen Saturation (Exit) 94 %    Rating of Perceived Exertion (Exercise) 15    Perceived Dyspnea (Exercise) 3    Duration Continue with 30 min of aerobic exercise without signs/symptoms of physical distress.    Intensity THRR unchanged      Progression   Progression Continue to progress workloads to maintain intensity without signs/symptoms  of physical distress.      Resistance Training   Training Prescription Yes    Weight blue bands    Reps 10-15    Time 10 Minutes      Oxygen   Oxygen Continuous    Liters 8      NuStep   Level 3    Minutes 15    METs 1.9      Track   Laps 7    Minutes 15    METs 2.08      Oxygen   Maintain Oxygen Saturation 88% or higher             Social History   Tobacco Use  Smoking Status Former   Packs/day: 1.00   Years: 39.00   Additional pack years: 0.00   Total pack years: 39.00   Types: Cigarettes   Quit date: 11/04/2018   Years since quitting: 3.8  Smokeless Tobacco Never    Goals Met:  Independence with exercise equipment Exercise tolerated well No report of concerns or symptoms today Strength training completed today  Goals Unmet:  Not Applicable  Comments: Service time is from 1011 to 1149.    Dr. Mechele Collin is Medical Director for Pulmonary Rehab  at [facility].

## 2022-09-26 ENCOUNTER — Encounter (HOSPITAL_COMMUNITY)
Admission: RE | Admit: 2022-09-26 | Discharge: 2022-09-26 | Disposition: A | Discharge status: Home / Self Care | Payer: Medicare Other | Source: Ambulatory Visit | Attending: Pulmonary Disease | Admitting: Pulmonary Disease

## 2022-09-26 DIAGNOSIS — J449 Chronic obstructive pulmonary disease, unspecified: Secondary | ICD-10-CM | POA: Insufficient documentation

## 2022-09-26 DIAGNOSIS — J9611 Chronic respiratory failure with hypoxia: Secondary | ICD-10-CM | POA: Diagnosis not present

## 2022-09-26 NOTE — Progress Notes (Signed)
Daily Session Note  Patient Details  Name: Rebecca Mcintyre MRN: [id number] Date of Birth: [birth year] Referring Provider:   Doristine Devoid Pulmonary Rehab Walk Test from 07/24/2022 in [facility] for Heart, Vascular, & Lung Health  Referring Provider Vassie Loll       Encounter Date: 09/26/2022  Check In:  Session Check In - 09/26/22 1133       Check-In   Supervising physician immediately available to respond to emergencies [facility] immediately available    Physician(s) Edd Fabian, NP    [facility]    Staff Present Essie Hart, RN, BSN;Randi Idelle Crouch BS, ACSM-CEP, Exercise Physiologist;Haitham Dolinsky Earlene Plater, MS, ACSM-CEP, Exercise Physiologist;Casey Katrinka Blazing, RT    Virtual Visit No    Medication changes reported     No    Fall or balance concerns reported    No    Tobacco Cessation No Change    Warm-up and Cool-down Performed as group-led instruction    Resistance Training Performed Yes    VAD Patient? No      Pain Assessment   Currently in Pain? No/denies    Pain Score 0-No pain    Multiple Pain Sites No             Capillary Blood Glucose: No results found for this or any previous visit (from the past 24 hour(s)).    Social History   Tobacco Use  Smoking Status Former   Packs/day: 1.00   Years: 39.00   Additional pack years: 0.00   Total pack years: 39.00   Types: Cigarettes   Quit date: 11/04/2018   Years since quitting: 3.8  Smokeless Tobacco Never    Goals Met:  Proper associated with RPD/PD & O2 Sat Exercise tolerated well No report of concerns or symptoms today Strength training completed today  Goals Unmet:  Not Applicable  Comments: Service time is from 1015 to 1150.    Dr. Mechele Collin is Medical Director for Pulmonary Rehab at [facility].

## 2022-09-27 DIAGNOSIS — J9612 Chronic respiratory failure with hypercapnia: Secondary | ICD-10-CM | POA: Diagnosis not present

## 2022-09-27 DIAGNOSIS — J449 Chronic obstructive pulmonary disease, unspecified: Secondary | ICD-10-CM | POA: Diagnosis not present

## 2022-09-27 DIAGNOSIS — J9611 Chronic respiratory failure with hypoxia: Secondary | ICD-10-CM | POA: Diagnosis not present

## 2022-09-30 DIAGNOSIS — J449 Chronic obstructive pulmonary disease, unspecified: Secondary | ICD-10-CM | POA: Diagnosis not present

## 2022-09-30 DIAGNOSIS — J9612 Chronic respiratory failure with hypercapnia: Secondary | ICD-10-CM | POA: Diagnosis not present

## 2022-10-01 ENCOUNTER — Encounter (HOSPITAL_COMMUNITY)
Admission: RE | Admit: 2022-10-01 | Discharge: 2022-10-01 | Disposition: A | Discharge status: Home / Self Care | Payer: Medicare Other | Source: Ambulatory Visit | Attending: Pulmonary Disease | Admitting: Pulmonary Disease

## 2022-10-01 VITALS — Wt 211.0 lb

## 2022-10-01 DIAGNOSIS — J449 Chronic obstructive pulmonary disease, unspecified: Secondary | ICD-10-CM | POA: Diagnosis not present

## 2022-10-01 DIAGNOSIS — C44229 Squamous cell carcinoma of skin of left ear and external auricular canal: Secondary | ICD-10-CM | POA: Diagnosis not present

## 2022-10-01 NOTE — Progress Notes (Signed)
Daily Session Note  Patient Details  Name: Rebecca Mcintyre MRN: [id number] Date of Birth: [birth year] Referring Provider:   Doristine Devoid Pulmonary Rehab Walk Test from 07/24/2022 in [facility], Vascular, [facility]  Referring Provider Vassie Loll       Encounter Date: 10/01/2022  Check In:  Session Check In - 10/01/22 1216       Check-In   Supervising physician immediately available to respond to emergencies [facility] MD immediately available    Physician(s) Carlos Levering, NP    [facility]    Staff Present Essie Hart, RN, BSN;Randi Idelle Crouch BS, ACSM-CEP, Exercise Physiologist;Kaylee Earlene Plater, MS, ACSM-CEP, Exercise Physiologist;Casey Marcille Buffy, RN, BSN    Virtual Visit No    Medication changes reported     No    Fall or balance concerns reported    No    Tobacco Cessation No Change    Warm-up and Cool-down Performed as group-led instruction    Resistance Training Performed Yes    VAD Patient? No    PAD/SET Patient? No      Pain Assessment   Currently in Pain? No/denies    Pain Score 0-No pain    Multiple Pain Sites No             Capillary Blood Glucose: No results found for this or any previous visit (from the past 24 hour(s)).    Social History   Tobacco Use  Smoking Status Former   Packs/day: 1.00   Years: 39.00   Additional pack years: 0.00   Total pack years: 39.00   Types: Cigarettes   Quit date: 11/04/2018   Years since quitting: 3.9  Smokeless Tobacco Never    Goals Met:  Independence with exercise equipment Improved SOB with ADL's Exercise tolerated well No report of concerns or symptoms today Strength training completed today  Goals Unmet:  Not Applicable  Comments: Service time is from 1028 to 1149    Dr. Mechele Collin is Medical Director for Pulmonary Rehab at [facility].

## 2022-10-03 ENCOUNTER — Encounter (HOSPITAL_COMMUNITY): Payer: Medicare Other

## 2022-10-07 ENCOUNTER — Other Ambulatory Visit: Payer: Self-pay | Admitting: Internal Medicine

## 2022-10-08 ENCOUNTER — Encounter (HOSPITAL_COMMUNITY): Payer: Medicare Other

## 2022-10-09 NOTE — Progress Notes (Signed)
Pulmonary Individual Treatment Plan  Patient Details  Name: Rebecca Mcintyre MRN: [id number] Date of Birth: [birth year] Referring Provider:   Doristine Devoid Pulmonary Rehab Walk Test from 07/24/2022 in [facility]  Referring Provider Vassie Loll       Initial Encounter Date:  Flowsheet Row Pulmonary Rehab Walk Test from 07/24/2022 in [facility]  Date 07/24/22       Visit Diagnosis: Stage 2 moderate COPD by GOLD classification (HCC)  Patient's Home Medications on Admission:   Current Outpatient Medications:    ACCU-CHEK GUIDE test strip, USE AS DIRECTED TWICE A DAY, Disp: 100 strip, Rfl: 3   Accu-Chek Softclix Lancets lancets, Use as instructed twice a day Diagnosis E11.9, Disp: 100 each, Rfl: 3   acetaminophen (TYLENOL) 500 MG tablet, Take 1,000 mg by mouth every 6 (six) hours as needed for mild pain., Disp: , Rfl:    albuterol (PROVENTIL) (2.5 MG/3ML) 0.083% nebulizer solution, Take 3 mLs (2.5 mg total) by nebulization every 6 (six) hours as needed for wheezing or shortness of breath., Disp: 300 mL, Rfl: 11   albuterol (VENTOLIN HFA) 108 (90 Base) MCG/ACT inhaler, USE 2 PUFFS EVERY 6 HOURS AS NEEDED FOR WHEEZE OR SHORTNESS OF BREATH (Patient taking differently: Inhale 2 puffs into the lungs every 6 (six) hours as needed for wheezing or shortness of breath.), Disp: 8.5 each, Rfl: 5   allopurinol (ZYLOPRIM) 100 MG tablet, TAKE 1 TABLET BY MOUTH TWICE A DAY, Disp: 180 tablet, Rfl: 1   arformoterol (BROVANA) 15 MCG/2ML NEBU, Take 2 mLs (15 mcg total) by nebulization 2 (two) times daily., Disp: 120 mL, Rfl: 11   aspirin 81 MG tablet, Take 81 mg by mouth daily., Disp: , Rfl:    Blood Glucose Monitoring Suppl (ACCU-CHEK GUIDE) w/Device KIT, Use as directed twice a day Diagnosis E11.9, Disp: 1 kit, Rfl: 2   budesonide (PULMICORT) 0.5 MG/2ML nebulizer solution, Take 2 mLs (0.5 mg total) by  nebulization 2 (two) times daily., Disp: 120 mL, Rfl: 12   calcium carbonate (TUMS EX) 750 MG chewable tablet, Chew 750 mg by mouth daily as needed for heartburn., Disp: , Rfl:    dapagliflozin propanediol (FARXIGA) 10 MG TABS tablet, Take 1 tablet (10 mg total) by mouth daily., Disp: 30 tablet, Rfl: 3   furosemide (LASIX) 40 MG tablet, TAKE 1 TABLET BY MOUTH EVERY DAY, Disp: 90 tablet, Rfl: 0   guaiFENesin (MUCINEX) 600 MG 12 hr tablet, Take 2 tablets (1,200 mg total) by mouth 2 (two) times daily., Disp: 60 tablet, Rfl: 0   KLOR-CON M10 10 MEQ tablet, TAKE 1 TABLET BY MOUTH TWICE A DAY, Disp: 180 tablet, Rfl: 0   metFORMIN (GLUCOPHAGE) 500 MG tablet, Take 2 tablets (1,000 mg total) by mouth in the morning and at bedtime., Disp: 360 tablet, Rfl: 0   metoprolol tartrate (LOPRESSOR) 25 MG tablet, TAKE 1/2 TABLETS BY MOUTH 2 TIMES DAILY, Disp: 90 tablet, Rfl: 0   nitroGLYCERIN (NITROSTAT) 0.4 MG SL tablet, PLACE 1 TABLET UNDER THE TONGUE EVERY 5 MINUTES AS NEEDED. (Patient taking differently: Place 0.4 mg under the tongue every 5 (five) minutes as needed for chest pain.), Disp: 25 tablet, Rfl: 1   Polyethylene Glycol 400 (VISINE DRY EYE RELIEF OP), Place 1 drop into both eyes daily as needed (dry eyes)., Disp: , Rfl:    revefenacin (YUPELRI) 175 MCG/3ML nebulizer solution, Take 3 mLs (175 mcg  total) by nebulization daily., Disp: 90 mL, Rfl: 6   rOPINIRole (REQUIP) 0.5 MG tablet, TAKE 1 TABLET AT BEDTIME FOR RESTLESS LEG, Disp: 90 tablet, Rfl: 0   rosuvastatin (CRESTOR) 20 MG tablet, Take 1 tablet (20 mg total) by mouth daily. (Patient taking differently: Take 20 mg by mouth at bedtime.), Disp: 90 tablet, Rfl: 3   tirzepatide (MOUNJARO) 10 MG/0.5ML Pen, Inject 10 mg into the skin once a week., Disp: 2 mL, Rfl: 1  Past Medical History: Past Medical History:  Diagnosis Date   Acute on chronic respiratory failure with hypoxia and hypercapnia (HCC) 11/05/2018   Allergy    ASCUS on Pap smear    had repeat  x 2 normal with Dr. Senaida Ores 2011   Coronary artery disease    Gout    Hyperlipidemia    Hypertension    Myocardial infarction [facility])    2002 95% proximal LAD stenosis with thrombus followed by 90% stenosis, the first diagonal 80% stenosis, circumflex 30% stenosis, circumflex obtuse marginal subbranch 70% stenosis, PDA 40% stenosis. She had a drug-eluting stent placement with a Pixel stent   Osteopenia    Solitary kidney     Tobacco Use: Social History   Tobacco Use  Smoking Status Former   Packs/day: 1.00   Years: 39.00   Additional pack years: 0.00   Total pack years: 39.00   Types: Cigarettes   Quit date: 11/04/2018   Years since quitting: 3.9  Smokeless Tobacco Never    Labs: Review Flowsheet  More data exists      Latest Ref Rng & Units 04/10/2021 01/29/2022 02/06/2022 04/15/2022 06/04/2022  Labs for ITP Cardiac and Pulmonary Rehab  Cholestrol 100 - 199 mg/dL 811  914  - - 782   LDL (calc) 0 - 99 mg/dL 96  99  - - 76   HDL-C >39 mg/dL 95.62  43  - - 59   Trlycerides 0 - 149 mg/dL 130.8  657  - - 846   Hemoglobin A1c 4.0 - 5.6 % 7.5  - - 6.4  -  PH, Arterial 7.35 - 7.45 - - 7.47  - -  PCO2 arterial 32 - 48 mmHg - - 57  - -  Bicarbonate 20.0 - 28.0 mmol/L - - 41.5  - -  O2 Saturation % - - 94.3  - -    Capillary Blood Glucose: Lab Results  Component Value Date   GLUCAP 115 (H) 08/01/2022   GLUCAP 93 08/01/2022   GLUCAP 114 (H) 07/30/2022   GLUCAP 105 (H) 07/30/2022   GLUCAP 124 (H) 05/30/2022    POCT Glucose     Row Name 07/24/22 1044             POCT Blood Glucose   Pre-Exercise 98 mg/dL                Pulmonary Assessment Scores:  Pulmonary Assessment Scores     Row Name 07/24/22 1055         ADL UCSD   SOB Score total 81       CAT Score   CAT Score 18       mMRC Score   mMRC Score 4             UCSD: Self-administered rating of dyspnea associated with activities of daily living (ADLs) 6-point scale (0 = "not at all" to 5 =  "maximal or unable to do because of breathlessness")  Scoring Scores range from 0 to 120.  Minimally important difference is 5 units  CAT: CAT can identify the health impairment of COPD patients and is better correlated with disease progression.  CAT has a scoring range of zero to 40. The CAT score is classified into four groups of low (less than 10), medium (10 - 20), high (21-30) and very high (31-40) based on the impact level of disease on health status. A CAT score over 10 suggests significant symptoms.  A worsening CAT score could be explained by an exacerbation, poor medication adherence, poor inhaler technique, or progression of COPD or comorbid conditions.  CAT MCID is 2 points  mMRC: mMRC (Modified Medical Research Council) Dyspnea Scale is used to assess the degree of baseline functional disability in patients of respiratory disease due to dyspnea. No minimal important difference is established. A decrease in score of 1 point or greater is considered a positive change.   Pulmonary Function Assessment:  Pulmonary Function Assessment - 07/24/22 1055       Breath   Bilateral Breath Sounds Decreased;Expiratory;Wheezes    Shortness of Breath Yes;Fear of Shortness of Breath;Panic with Shortness of Breath;Limiting activity             Exercise Target Goals: Exercise Program Goal: Individual exercise prescription set using results from initial 6 min walk test and THRR while considering  patient's activity barriers and safety.   Exercise Prescription Goal: Initial exercise prescription builds to 30-45 minutes a day of aerobic activity, 2-3 days per week.  Home exercise guidelines will be given to patient during program as part of exercise prescription that the participant will acknowledge.  Activity Barriers & Risk Stratification:  Activity Barriers & Cardiac Risk Stratification - 07/24/22 1232       Activity Barriers & Cardiac Risk Stratification   Activity Barriers  Deconditioning;Muscular Weakness;Shortness of Breath;Assistive Device;Other (comment)    Comments balance issues             6 Minute Walk:  6 Minute Walk     Row Name 07/24/22 1220         6 Minute Walk   Phase Initial     Distance 720 feet     Walk Time 6 minutes     # of Rest Breaks 0     MPH 1.36     METS 1.06     RPE 13     Perceived Dyspnea  2     VO2 Peak 3.72     Resting HR 95 bpm     Resting BP 102/56     Resting Oxygen Saturation  93 %     Exercise Oxygen Saturation  during 6 min walk 87 %     Max Ex. HR 109 bpm     Max Ex. BP 142/70     2 Minute Post BP 128/68       Interval HR   1 Minute HR 101     2 Minute HR 108     3 Minute HR 105     4 Minute HR 103     5 Minute HR 108     6 Minute HR 109     2 Minute Post HR 100     Interval Heart Rate? Yes       Interval Oxygen   Interval Oxygen? Yes     Baseline Oxygen Saturation % 93 %     1 Minute Oxygen Saturation % 94 %     1 Minute Liters of Oxygen 6 L  2 Minute Oxygen Saturation % 88 %  31sec, 87%     2 Minute Liters of Oxygen 6 L  31sec, increased to 8L     3 Minute Oxygen Saturation % 91 %     3 Minute Liters of Oxygen 8 L     4 Minute Oxygen Saturation % 90 %     4 Minute Liters of Oxygen 8 L     5 Minute Oxygen Saturation % 90 %     5 Minute Liters of Oxygen 8 L     6 Minute Oxygen Saturation % 89 %     6 Minute Liters of Oxygen 8 L     2 Minute Post Oxygen Saturation % 97 %     2 Minute Post Liters of Oxygen 8 L              Oxygen Initial Assessment:  Oxygen Initial Assessment - 07/24/22 1053       Home Oxygen   Home Oxygen Device Home Concentrator;E-Tanks    Sleep Oxygen Prescription Continuous    Liters per minute 4    Home Exercise Oxygen Prescription Continuous    Liters per minute 6    Home Resting Oxygen Prescription Continuous    Liters per minute 4    Compliance with Home Oxygen Use Yes      Initial 6 min Walk   Oxygen Used Continuous    Liters per  minute 8      Program Oxygen Prescription   Program Oxygen Prescription Continuous    Liters per minute 8      Intervention   Short Term Goals To learn and exhibit compliance with exercise, home and travel O2 prescription;To learn and understand importance of monitoring SPO2 with pulse oximeter and demonstrate accurate use of the pulse oximeter.;To learn and understand importance of maintaining oxygen saturations>88%;To learn and demonstrate proper pursed lip breathing techniques or other breathing techniques. ;To learn and demonstrate proper use of respiratory medications    Long  Term Goals Exhibits compliance with exercise, home  and travel O2 prescription;Verbalizes importance of monitoring SPO2 with pulse oximeter and return demonstration;Maintenance of O2 saturations>88%;Exhibits proper breathing techniques, such as pursed lip breathing or other method taught during program session;Compliance with respiratory medication             Oxygen Re-Evaluation:  Oxygen Re-Evaluation     Row Name 08/02/22 1610 08/29/22 0706 10/03/22 1632         Program Oxygen Prescription   Program Oxygen Prescription Continuous Continuous Continuous     Liters per minute 8 8 8        Home Oxygen   Home Oxygen Device Home Concentrator;E-Tanks Home Concentrator;E-Tanks Home Concentrator;E-Tanks     Sleep Oxygen Prescription Continuous Continuous Continuous     Liters per minute 4 4 4      Home Exercise Oxygen Prescription Continuous Continuous Continuous     Liters per minute 6 6 6      Home Resting Oxygen Prescription Continuous Continuous Continuous     Liters per minute 4 4 4      Compliance with Home Oxygen Use Yes Yes Yes       Goals/Expected Outcomes   Short Term Goals To learn and exhibit compliance with exercise, home and travel O2 prescription;To learn and understand importance of monitoring SPO2 with pulse oximeter and demonstrate accurate use of the pulse oximeter.;To learn and understand  importance of maintaining oxygen saturations>88%;To learn and demonstrate proper pursed  lip breathing techniques or other breathing techniques. ;To learn and demonstrate proper use of respiratory medications To learn and exhibit compliance with exercise, home and travel O2 prescription;To learn and understand importance of monitoring SPO2 with pulse oximeter and demonstrate accurate use of the pulse oximeter.;To learn and understand importance of maintaining oxygen saturations>88%;To learn and demonstrate proper pursed lip breathing techniques or other breathing techniques. ;To learn and demonstrate proper use of respiratory medications To learn and exhibit compliance with exercise, home and travel O2 prescription;To learn and understand importance of monitoring SPO2 with pulse oximeter and demonstrate accurate use of the pulse oximeter.;To learn and understand importance of maintaining oxygen saturations>88%;To learn and demonstrate proper pursed lip breathing techniques or other breathing techniques. ;To learn and demonstrate proper use of respiratory medications     Long  Term Goals Exhibits compliance with exercise, home  and travel O2 prescription;Verbalizes importance of monitoring SPO2 with pulse oximeter and return demonstration;Maintenance of O2 saturations>88%;Exhibits proper breathing techniques, such as pursed lip breathing or other method taught during program session;Compliance with respiratory medication Exhibits compliance with exercise, home  and travel O2 prescription;Verbalizes importance of monitoring SPO2 with pulse oximeter and return demonstration;Maintenance of O2 saturations>88%;Exhibits proper breathing techniques, such as pursed lip breathing or other method taught during program session;Compliance with respiratory medication Exhibits compliance with exercise, home  and travel O2 prescription;Verbalizes importance of monitoring SPO2 with pulse oximeter and return demonstration;Maintenance  of O2 saturations>88%;Exhibits proper breathing techniques, such as pursed lip breathing or other method taught during program session;Compliance with respiratory medication     Goals/Expected Outcomes Compliance and understanding of oxygen saturation monitoring and breathing techniques to decrease shortness of breath. Compliance and understanding of oxygen saturation monitoring and breathing techniques to decrease shortness of breath. Compliance and understanding of oxygen saturation monitoring and breathing techniques to decrease shortness of breath.              Oxygen Discharge (Final Oxygen Re-Evaluation):  Oxygen Re-Evaluation - 10/03/22 1632       Program Oxygen Prescription   Program Oxygen Prescription Continuous    Liters per minute 8      Home Oxygen   Home Oxygen Device Home Concentrator;E-Tanks    Sleep Oxygen Prescription Continuous    Liters per minute 4    Home Exercise Oxygen Prescription Continuous    Liters per minute 6    Home Resting Oxygen Prescription Continuous    Liters per minute 4    Compliance with Home Oxygen Use Yes      Goals/Expected Outcomes   Short Term Goals To learn and exhibit compliance with exercise, home and travel O2 prescription;To learn and understand importance of monitoring SPO2 with pulse oximeter and demonstrate accurate use of the pulse oximeter.;To learn and understand importance of maintaining oxygen saturations>88%;To learn and demonstrate proper pursed lip breathing techniques or other breathing techniques. ;To learn and demonstrate proper use of respiratory medications    Long  Term Goals Exhibits compliance with exercise, home  and travel O2 prescription;Verbalizes importance of monitoring SPO2 with pulse oximeter and return demonstration;Maintenance of O2 saturations>88%;Exhibits proper breathing techniques, such as pursed lip breathing or other method taught during program session;Compliance with respiratory medication     Goals/Expected Outcomes Compliance and understanding of oxygen saturation monitoring and breathing techniques to decrease shortness of breath.             Initial Exercise Prescription:  Initial Exercise Prescription - 07/24/22 1200       Date of  Initial Exercise RX and Referring Provider   Date 07/24/22    Referring Provider [facility]    Expected Discharge Date 10/17/22      Oxygen   Oxygen Continuous    Liters 8    Maintain Oxygen Saturation 88% or higher      NuStep   Level --    SPM --    Minutes --      Recumbant Elliptical   Level 1    Watts 20    Minutes 15    METs 2      Track   Minutes 15      Prescription Details   Frequency (times per week) 2    Duration Progress to 30 minutes of continuous aerobic without signs/symptoms of physical distress      Intensity   THRR 40-80% of Max Heartrate 58-116    Ratings of Perceived Exertion 11-13    Perceived Dyspnea 0-4      Progression   Progression Continue to progress workloads to maintain intensity without signs/symptoms of physical distress.      Resistance Training   Training Prescription Yes    Weight red bands    Reps 10-15             Perform Capillary Blood Glucose checks as needed.  Exercise Prescription Changes:   Exercise Prescription Changes     Row Name 08/01/22 1200 08/13/22 1200 09/03/22 1500 09/10/22 1600 09/24/22 1200     Response to Exercise   Blood Pressure (Admit) 120/70 120/64 -- 128/60 122/60   Blood Pressure (Exercise) 142/70 156/80 -- 142/78 118/64   Blood Pressure (Exit) 106/56 132/70 -- 94/58 106/52   Heart Rate (Admit) 87 bpm 89 bpm -- 90 bpm 96 bpm   Heart Rate (Exercise) 101 bpm 107 bpm -- 105 bpm 109 bpm   Heart Rate (Exit) 93 bpm 93 bpm -- 94 bpm 94 bpm   Oxygen Saturation (Admit) 97 % 98 % -- 94 % 93 %   Oxygen Saturation (Exercise) 88 % 89 % -- 92 % 92 %   Oxygen Saturation (Exit) 94 % 98 % -- 97 % 94 %   Rating of Perceived Exertion (Exercise) 17 13 -- 15 15    Perceived Dyspnea (Exercise) 4 3 -- 3 3   Duration Continue with 30 min of aerobic exercise without signs/symptoms of physical distress. Continue with 30 min of aerobic exercise without signs/symptoms of physical distress. -- Continue with 30 min of aerobic exercise without signs/symptoms of physical distress. Continue with 30 min of aerobic exercise without signs/symptoms of physical distress.   Intensity THRR unchanged THRR unchanged -- THRR unchanged THRR unchanged     Progression   Progression -- Continue to progress workloads to maintain intensity without signs/symptoms of physical distress. -- Continue to progress workloads to maintain intensity without signs/symptoms of physical distress. Continue to progress workloads to maintain intensity without signs/symptoms of physical distress.     Resistance Training   Training Prescription Yes Yes -- Yes Yes   Weight red bands red bands -- red bands blue bands   Reps 10-15 10-15 -- 10-15 10-15   Time 10 Minutes 10 Minutes -- 10 Minutes 10 Minutes     Oxygen   Oxygen Continuous Continuous -- Continuous Continuous   Liters 8 6-8 -- 8 8     NuStep   Level 1 1 -- 2 3   Minutes 15 15 -- 15 15   METs 1.3 1.6 -- 2 1.9  Track   Laps 4 7 -- 6 7   Minutes 15 15 -- 15 15   METs 1.62 2.23 -- 1.92 2.08     Home Exercise Plan   Plans to continue exercise at -- -- Home (comment) -- --   Frequency -- -- Add 1 additional day to program exercise sessions. -- --   Initial Home Exercises Provided -- -- 09/03/22 -- --     Oxygen   Maintain Oxygen Saturation 88% or higher 88% or higher -- 88% or higher 88% or higher    Row Name 10/01/22 1209             Response to Exercise   Blood Pressure (Admit) 124/60       Blood Pressure (Exit) 100/60       Heart Rate (Admit) 92 bpm       Heart Rate (Exercise) 105 bpm       Heart Rate (Exit) 100 bpm       Oxygen Saturation (Admit) 99 %       Oxygen Saturation (Exercise) 91 %       Oxygen Saturation  (Exit) 95 %       Rating of Perceived Exertion (Exercise) 14       Perceived Dyspnea (Exercise) 3       Duration Continue with 30 min of aerobic exercise without signs/symptoms of physical distress.       Intensity THRR unchanged         Progression   Progression Continue to progress workloads to maintain intensity without signs/symptoms of physical distress.         Resistance Training   Training Prescription Yes       Weight blue bands       Reps 10-15       Time 10 Minutes         Oxygen   Oxygen Continuous       Liters 6-8         NuStep   Level 3       Minutes 15       METs 2         Track   Laps 7       Minutes 15       METs 2.08         Oxygen   Maintain Oxygen Saturation 88% or higher                Exercise Comments:   Exercise Comments     Row Name 07/30/22 1215 09/03/22 1550         Exercise Comments Pt completed 1st day of exercise. She exercised 15 min on the track and recumbent elliptical. Dezeray averaged 1.62 METs on the track and 1.2 METs on the recumbent elliptical. She performed the warmup and cooldown standing holding onto a rollator for balance. Discussed METs and how to increase METs Completed home exercise plan. Denaria is performing the warmup and cooldown exercises at home. She is not doing any long bouts of aerobic exercise. I discussed starting some walking at home. I encouraged Britnie to start walking 1 non-rehab day/wk for 3x5 min. She agreed with my recommendations. Jazyah seems motivated to exercise and improve her functional capacity.               Exercise Goals and Review:   Exercise Goals     Row Name 07/24/22 [phone] 08/02/22 0805 08/29/22 9604 10/03/22 1629  Exercise Goals   Increase Physical Activity Yes Yes Yes Yes    Intervention Develop an individualized exercise prescription for aerobic and resistive training based on initial evaluation findings, risk stratification, comorbidities and participant's personal goals.;Provide  advice, education, support and counseling about physical activity/exercise needs. Develop an individualized exercise prescription for aerobic and resistive training based on initial evaluation findings, risk stratification, comorbidities and participant's personal goals.;Provide advice, education, support and counseling about physical activity/exercise needs. Develop an individualized exercise prescription for aerobic and resistive training based on initial evaluation findings, risk stratification, comorbidities and participant's personal goals.;Provide advice, education, support and counseling about physical activity/exercise needs. Develop an individualized exercise prescription for aerobic and resistive training based on initial evaluation findings, risk stratification, comorbidities and participant's personal goals.;Provide advice, education, support and counseling about physical activity/exercise needs.    Expected Outcomes Long Term: Add in home exercise to make exercise part of routine and to increase amount of physical activity.;Short Term: Attend rehab on a regular basis to increase amount of physical activity.;Long Term: Exercising regularly at least 3-5 days a week. Long Term: Add in home exercise to make exercise part of routine and to increase amount of physical activity.;Short Term: Attend rehab on a regular basis to increase amount of physical activity.;Long Term: Exercising regularly at least 3-5 days a week. Long Term: Add in home exercise to make exercise part of routine and to increase amount of physical activity.;Short Term: Attend rehab on a regular basis to increase amount of physical activity.;Long Term: Exercising regularly at least 3-5 days a week. Long Term: Add in home exercise to make exercise part of routine and to increase amount of physical activity.;Short Term: Attend rehab on a regular basis to increase amount of physical activity.;Long Term: Exercising regularly at least 3-5 days a  week.    Increase Strength and Stamina Yes Yes Yes Yes    Intervention Provide advice, education, support and counseling about physical activity/exercise needs.;Develop an individualized exercise prescription for aerobic and resistive training based on initial evaluation findings, risk stratification, comorbidities and participant's personal goals. Provide advice, education, support and counseling about physical activity/exercise needs.;Develop an individualized exercise prescription for aerobic and resistive training based on initial evaluation findings, risk stratification, comorbidities and participant's personal goals. Provide advice, education, support and counseling about physical activity/exercise needs.;Develop an individualized exercise prescription for aerobic and resistive training based on initial evaluation findings, risk stratification, comorbidities and participant's personal goals. Provide advice, education, support and counseling about physical activity/exercise needs.;Develop an individualized exercise prescription for aerobic and resistive training based on initial evaluation findings, risk stratification, comorbidities and participant's personal goals.    Expected Outcomes Short Term: Increase workloads from initial exercise prescription for resistance, speed, and METs.;Short Term: Perform resistance training exercises routinely during rehab and add in resistance training at home;Long Term: Improve cardiorespiratory fitness, muscular endurance and strength as measured by increased METs and functional capacity ( ) Short Term: Increase workloads from initial exercise prescription for resistance, speed, and METs.;Short Term: Perform resistance training exercises routinely during rehab and add in resistance training at home;Long Term: Improve cardiorespiratory fitness, muscular endurance and strength as measured by increased METs and functional capacity ( ) Short Term: Increase workloads  from initial exercise prescription for resistance, speed, and METs.;Short Term: Perform resistance training exercises routinely during rehab and add in resistance training at home;Long Term: Improve cardiorespiratory fitness, muscular endurance and strength as measured by increased METs and functional capacity ( ) Short Term: Increase workloads from initial exercise prescription for resistance,  speed, and METs.;Short Term: Perform resistance training exercises routinely during rehab and add in resistance training at home;Long Term: Improve cardiorespiratory fitness, muscular endurance and strength as measured by increased METs and functional capacity ( )    Able to understand and use rate of perceived exertion (RPE) scale Yes Yes Yes Yes    Intervention Provide education and explanation on how to use RPE scale Provide education and explanation on how to use RPE scale Provide education and explanation on how to use RPE scale Provide education and explanation on how to use RPE scale    Expected Outcomes Short Term: Able to use RPE daily in rehab to express subjective intensity level;Long Term:  Able to use RPE to guide intensity level when exercising independently Short Term: Able to use RPE daily in rehab to express subjective intensity level;Long Term:  Able to use RPE to guide intensity level when exercising independently Short Term: Able to use RPE daily in rehab to express subjective intensity level;Long Term:  Able to use RPE to guide intensity level when exercising independently Short Term: Able to use RPE daily in rehab to express subjective intensity level;Long Term:  Able to use RPE to guide intensity level when exercising independently    Able to understand and use Dyspnea scale Yes Yes Yes Yes    Intervention Provide education and explanation on how to use Dyspnea scale Provide education and explanation on how to use Dyspnea scale Provide education and explanation on how to use Dyspnea scale  Provide education and explanation on how to use Dyspnea scale    Expected Outcomes Short Term: Able to use Dyspnea scale daily in rehab to express subjective sense of shortness of breath during exertion;Long Term: Able to use Dyspnea scale to guide intensity level when exercising independently Short Term: Able to use Dyspnea scale daily in rehab to express subjective sense of shortness of breath during exertion;Long Term: Able to use Dyspnea scale to guide intensity level when exercising independently Short Term: Able to use Dyspnea scale daily in rehab to express subjective sense of shortness of breath during exertion;Long Term: Able to use Dyspnea scale to guide intensity level when exercising independently Short Term: Able to use Dyspnea scale daily in rehab to express subjective sense of shortness of breath during exertion;Long Term: Able to use Dyspnea scale to guide intensity level when exercising independently    Knowledge and understanding of Target Heart Rate Range (THRR) Yes Yes Yes Yes    Intervention Provide education and explanation of THRR including how the numbers were predicted and where they are located for reference Provide education and explanation of THRR including how the numbers were predicted and where they are located for reference Provide education and explanation of THRR including how the numbers were predicted and where they are located for reference Provide education and explanation of THRR including how the numbers were predicted and where they are located for reference    Expected Outcomes Short Term: Able to state/look up THRR;Short Term: Able to use daily as guideline for intensity in rehab;Long Term: Able to use THRR to govern intensity when exercising independently Short Term: Able to state/look up THRR;Short Term: Able to use daily as guideline for intensity in rehab;Long Term: Able to use THRR to govern intensity when exercising independently Short Term: Able to state/look up  THRR;Short Term: Able to use daily as guideline for intensity in rehab;Long Term: Able to use THRR to govern intensity when exercising independently Short Term: Able to state/look  up THRR;Short Term: Able to use daily as guideline for intensity in rehab;Long Term: Able to use THRR to govern intensity when exercising independently    Understanding of Exercise Prescription Yes Yes Yes Yes    Intervention Provide education, explanation, and written materials on patient's individual exercise prescription Provide education, explanation, and written materials on patient's individual exercise prescription Provide education, explanation, and written materials on patient's individual exercise prescription Provide education, explanation, and written materials on patient's individual exercise prescription    Expected Outcomes Short Term: Able to explain program exercise prescription;Long Term: Able to explain home exercise prescription to exercise independently Short Term: Able to explain program exercise prescription;Long Term: Able to explain home exercise prescription to exercise independently Short Term: Able to explain program exercise prescription;Long Term: Able to explain home exercise prescription to exercise independently Short Term: Able to explain program exercise prescription;Long Term: Able to explain home exercise prescription to exercise independently             Exercise Goals Re-Evaluation :  Exercise Goals Re-Evaluation     Row Name 08/02/22 0806 08/29/22 0704 10/03/22 1629         Exercise Goal Re-Evaluation   Exercise Goals Review Increase Physical Activity;Able to understand and use Dyspnea scale;Understanding of Exercise Prescription;Increase Strength and Stamina;Knowledge and understanding of Target Heart Rate Range (THRR);Able to understand and use rate of perceived exertion (RPE) scale Increase Physical Activity;Able to understand and use Dyspnea scale;Understanding of Exercise  Prescription;Increase Strength and Stamina;Knowledge and understanding of Target Heart Rate Range (THRR);Able to understand and use rate of perceived exertion (RPE) scale Increase Physical Activity;Able to understand and use Dyspnea scale;Understanding of Exercise Prescription;Increase Strength and Stamina;Knowledge and understanding of Target Heart Rate Range (THRR);Able to understand and use rate of perceived exertion (RPE) scale     Comments Ziggy has completed 2 exercise sessions. Roann exercises for 15 min on the track and Nustep. She averages 1.62 METs on the track and 1.3 METs at level 1 on the Nustep. She performs the warmup and cooldown standing holding onto a rollator for balance. Walida is very deconditioned as she admits to not exercising since the last time she was in rehab. Jeni was originally exercising on the recumbent elliptical. The recumbent elliptical seemed to be difficult for her, so she was moved to the Nustep. Erendida seemed to tolerate the Nustep better. Will continue to monitor and progress as able. Tonika has completed 6 exercise sessions. Blia exercises for 15 min on the track and Nustep. She averages 1.77 METs on the track and 1.3 METs at level 1 on the Nustep. She performs the warmup and cooldown standing holding onto a rollator for balance. Ching iis still deconditioned. She has not progressed in track laps or level on the Nustep. Santosh has also been absent for the past week due to illness.  Aissata remaines positive despite her deconditioning. Will continue to monitor and progress as able. Desra has completed 15 exercise sessions. Avaya exercises for 15 min on the track and Nustep. She averages 2.08 METs on the track and 2.0 METs at level 3 on the Nustep. She performs the warmup and cooldown standing holding onto a rollator for balance. Remiyah has improved since starting the program. She has increased her workload for both exercise modes as METs have increased. She tolerates progressions well. I have discussed  home exercise with her as I encouraged her to get some walking in outside of rehab. Honestie has told me that she has increased  her home exercise. She is motivated to exercise and improve her functional capacity. Will continue to monitor and progress as able.     Expected Outcomes Through exercise at rehab and at home, the patient will decrease shortness of breath with daily activities and feel confident in carrying out an exercise regime at home. Through exercise at rehab and at home, the patient will decrease shortness of breath with daily activities and feel confident in carrying out an exercise regime at home. Through exercise at rehab and at home, the patient will decrease shortness of breath with daily activities and feel confident in carrying out an exercise regime at home.              Discharge Exercise Prescription (Final Exercise Prescription Changes):  Exercise Prescription Changes - 10/01/22 1209       Response to Exercise   Blood Pressure (Admit) 124/60    Blood Pressure (Exit) 100/60    Heart Rate (Admit) 92 bpm    Heart Rate (Exercise) 105 bpm    Heart Rate (Exit) 100 bpm    Oxygen Saturation (Admit) 99 %    Oxygen Saturation (Exercise) 91 %    Oxygen Saturation (Exit) 95 %    Rating of Perceived Exertion (Exercise) 14    Perceived Dyspnea (Exercise) 3    Duration Continue with 30 min of aerobic exercise without signs/symptoms of physical distress.    Intensity THRR unchanged      Progression   Progression Continue to progress workloads to maintain intensity without signs/symptoms of physical distress.      Resistance Training   Training Prescription Yes    Weight blue bands    Reps 10-15    Time 10 Minutes      Oxygen   Oxygen Continuous    Liters 6-8      NuStep   Level 3    Minutes 15    METs 2      Track   Laps 7    Minutes 15    METs 2.08      Oxygen   Maintain Oxygen Saturation 88% or higher             Nutrition:  Target Goals:  Understanding of nutrition guidelines, daily intake of sodium 1500mg , cholesterol 200mg , calories 30% from fat and 7% or less from saturated fats, daily to have 5 or more servings of fruits and vegetables.  Biometrics:    Nutrition Therapy Plan and Nutrition Goals:  Nutrition Therapy & Goals - 09/24/22 1109       Nutrition Therapy   Diet Heart Healthy Diet    Drug/Food Interactions Statins/Certain Fruits      Personal Nutrition Goals   Nutrition Goal Patient to improve diet quality by using the plate method as a daily guide for meal planning to include lean protein/plant protein, fruits, vegetables, whole grains, nonfat dairy as part of well balanced diet    Personal Goal #2 Patient to identify strategies for weight loss of 0.5-2.0# per week.    Comments Goals in action. She continues Mounjaro to aid with weight loss; she is down 16# since starting with our program. Reviewed goals and benefits of high protein/high fiber intake for weight loss.  Karneisha will benefit from participation in pulmonary rehab for nutrition, exercise, and lifestyle modification.      Intervention Plan   Intervention Prescribe, educate and counsel regarding individualized specific dietary modifications aiming towards targeted core components such as weight, hypertension, lipid management, diabetes,  heart failure and other comorbidities.;Nutrition handout(s) given to patient.    Expected Outcomes Short Term Goal: Understand basic principles of dietary content, such as calories, fat, sodium, cholesterol and nutrients.;Long Term Goal: Adherence to prescribed nutrition plan.             Nutrition Assessments:  MEDIFICTS Score Key: ?70 Need to make dietary changes  40-70 Heart Healthy Diet ? 40 Therapeutic Level Cholesterol Diet   Picture Your Plate Scores: <16 Unhealthy dietary pattern with much room for improvement. 41-50 Dietary pattern unlikely to meet recommendations for good health and room for  improvement. 51-60 More healthful dietary pattern, with some room for improvement.  >60 Healthy dietary pattern, although there may be some specific behaviors that could be improved.    Nutrition Goals Re-Evaluation:  Nutrition Goals Re-Evaluation     Row Name 07/30/22 1159 08/27/22 1152 09/24/22 1109         Goals   Current Weight 226 lb 13.7 oz (102.9 kg) 224 lb 3.3 oz (101.7 kg)  weight from last attended session on 3/21 211 lb 3.2 oz (95.8 kg)     Comment triglycerides 229, A1c 6.4 No new labs; most recent labs triglycerides 229, A1c 6.4 No new labs; most recent labs triglycerides 229, A1c 6.4     Expected Outcome Medley has previously completed pulmonary rehab ~4 years ago. She is motivated to lose weight. She recently started [facility] to aid with weight loss. Kenidy will benefit from participation in pulmonary rehab for nutrition, exercise, and lifestyle modification. Goals in action. Marynell has not attended pulmonary rehab since 3/21 due to not feeling well. She continues Mounjaro to aid with weight loss; she is down 3# since starting with our program. We have discussed multiple strategies for weight loss and benefit of a low sodium diet; she continues to follow a high protein/low carbohydrate diet. Tamike will benefit from participation in pulmonary rehab for nutrition, exercise, and lifestyle modification. Goals in action. She continues Mounjaro to aid with weight loss; she is down 16# since starting with our program. Reviewed goals and benefits of high protein/high fiber intake for weight loss. Merry will benefit from participation in pulmonary rehab for nutrition, exercise, and lifestyle modification.              Nutrition Goals Discharge (Final Nutrition Goals Re-Evaluation):  Nutrition Goals Re-Evaluation - 09/24/22 1109       Goals   Current Weight 211 lb 3.2 oz (95.8 kg)    Comment No new labs; most recent labs triglycerides 229, A1c 6.4    Expected Outcome Goals in action. She  continues Mounjaro to aid with weight loss; she is down 16# since starting with our program. Reviewed goals and benefits of high protein/high fiber intake for weight loss. Marivel will benefit from participation in pulmonary rehab for nutrition, exercise, and lifestyle modification.             Psychosocial: Target Goals: Acknowledge presence or absence of significant depression and/or stress, maximize coping skills, provide positive support system. Participant is able to verbalize types and ability to use techniques and skills needed for reducing stress and depression.  Initial Review & Psychosocial Screening:  Initial Psych Review & Screening - 07/24/22 1056       Initial Review   Current issues with None Identified      Family Dynamics   Good Support System? Yes      Barriers   Psychosocial barriers to participate in program There are no  identifiable barriers or psychosocial needs.      Screening Interventions   Interventions Encouraged to exercise             Quality of Life Scores:  Scores of 19 and below usually indicate a poorer quality of life in these areas.  A difference of  2-3 points is a clinically meaningful difference.  A difference of 2-3 points in the total score of the Quality of Life Index has been associated with significant improvement in overall quality of life, self-image, physical symptoms, and general health in studies assessing change in quality of life.  PHQ-9: Review Flowsheet  More data exists      07/24/2022 04/15/2022 04/03/2022 12/24/2021 05/22/2021  Depression screen PHQ 2/9  Decreased Interest 0 0 0 0 0  Down, Depressed, Hopeless 0 0 0 0 0  PHQ - 2 Score 0 0 0 0 0  Altered sleeping 0 0 - - 0  Tired, decreased energy 0 1 - - 0  Change in appetite 0 0 - - 0  Feeling bad or failure about yourself  0 0 - - 0  Trouble concentrating 0 0 - - 0  Moving slowly or fidgety/restless 0 0 - - 0  Suicidal thoughts 0 0 - - 0  PHQ-9 Score 0 1 - - 0   Difficult doing work/chores - Somewhat difficult - - -   Interpretation of Total Score  Total Score Depression Severity:  1-4 = Minimal depression, 5-9 = Mild depression, 10-14 = Moderate depression, 15-19 = Moderately severe depression, 20-27 = Severe depression   Psychosocial Evaluation and Intervention:  Psychosocial Evaluation - 07/24/22 1056       Psychosocial Evaluation & Interventions   Interventions Encouraged to exercise with the program and follow exercise prescription    Comments Nkiruka currently denies any psychosocial barriers or concerns    Expected Outcomes For Bradlie to participate in pulm rehab without any psychosocial barriers or concerns    Continue Psychosocial Services  No Follow up required             Psychosocial Re-Evaluation:  Psychosocial Re-Evaluation     Row Name 07/31/22 1146 08/28/22 0929 09/25/22 1512         Psychosocial Re-Evaluation   Current issues with None Identified None Identified Current Stress Concerns     Comments Faun remains to have no barriers or psychosocial concerns at this time. Carlyn remains to have no barriers or psychosocial concerns at this time. Loeta mentioned being a little stressed about a place she had taken off of her ear (questionable cancer). She denies needing a referral to therapist or meds at this time.     Expected Outcomes Terin will remain free of any psychosocial concerns. Graceland will remain free of any psychosocial concerns. Migna will remain free of any psychosocial concerns.     Interventions Encouraged to attend Pulmonary Rehabilitation for the exercise Encouraged to attend Pulmonary Rehabilitation for the exercise Encouraged to attend Pulmonary Rehabilitation for the exercise     Continue Psychosocial Services  No Follow up required No Follow up required No Follow up required              Psychosocial Discharge (Final Psychosocial Re-Evaluation):  Psychosocial Re-Evaluation - 09/25/22 1512       Psychosocial  Re-Evaluation   Current issues with Current Stress Concerns    Comments Laterika mentioned being a little stressed about a place she had taken off of her ear (questionable cancer). She denies  needing a referral to therapist or meds at this time.    Expected Outcomes Evellyn will remain free of any psychosocial concerns.    Interventions Encouraged to attend Pulmonary Rehabilitation for the exercise    Continue Psychosocial Services  No Follow up required             Education: Education Goals: Education classes will be provided on a weekly basis, covering required topics. Participant will state understanding/return demonstration of topics presented.  Learning Barriers/Preferences:  Learning Barriers/Preferences - 07/24/22 1057       Learning Barriers/Preferences   Learning Barriers Hearing    Learning Preferences Individual Instruction;Written Material             Education Topics: Introduction to Pulmonary Rehab Group instruction provided by PowerPoint, verbal discussion, and written material to support subject matter. Instructor reviews what Pulmonary Rehab is, the purpose of the program, and how patients are referred.     Know Your Numbers Group instruction that is supported by a PowerPoint presentation. Instructor discusses importance of knowing and understanding resting, exercise, and post-exercise oxygen saturation, heart rate, and blood pressure. Oxygen saturation, heart rate, blood pressure, rating of perceived exertion, and dyspnea are reviewed along with a normal range for these values.  Flowsheet Row PULMONARY REHAB CHRONIC OBSTRUCTIVE PULMONARY DISEASE from 09/05/2022 in [facility]  Date 09/05/22  Educator EP  Instruction Review Code 1- Verbalizes Understanding       Exercise for the Pulmonary Patient Group instruction that is supported by a PowerPoint presentation. Instructor discusses benefits of exercise,  core components of exercise, frequency, duration, and intensity of an exercise routine, importance of utilizing pulse oximetry during exercise, safety while exercising, and options of places to exercise outside of rehab.       MET Level  Group instruction provided by PowerPoint, verbal discussion, and written material to support subject matter. Instructor reviews what METs are and how to increase METs.  Flowsheet Row PULMONARY REHAB CHRONIC OBSTRUCTIVE PULMONARY DISEASE from 09/26/2022 in [facility]  Date 09/26/22  Educator EP  Instruction Review Code 1- Verbalizes Understanding       Pulmonary Medications Verbally interactive group education provided by instructor with focus on inhaled medications and proper administration.   Anatomy and Physiology of the Respiratory System Group instruction provided by PowerPoint, verbal discussion, and written material to support subject matter. Instructor reviews respiratory cycle and anatomical components of the respiratory system and their functions. Instructor also reviews differences in obstructive and restrictive respiratory diseases with examples of each.    Oxygen Safety Group instruction provided by PowerPoint, verbal discussion, and written material to support subject matter. There is an overview of "What is Oxygen" and "Why do we need it".  Instructor also reviews how to create a safe environment for oxygen use, the importance of using oxygen as prescribed, and the risks of noncompliance. There is a brief discussion on traveling with oxygen and resources the patient may utilize. Flowsheet Row PULMONARY REHAB CHRONIC OBSTRUCTIVE PULMONARY DISEASE from 09/12/2022 in [facility]  Date 09/12/22  Educator RN  Instruction Review Code 1- Verbalizes Understanding       Oxygen Use Group instruction provided by PowerPoint, verbal  discussion, and written material to discuss how supplemental oxygen is prescribed and different types of oxygen supply systems. Resources for more information are provided.  Flowsheet  Row PULMONARY REHAB CHRONIC OBSTRUCTIVE PULMONARY DISEASE from 09/19/2022 in [facility]  Date 09/19/22  Educator RT  Instruction Review Code 1- Verbalizes Understanding       Breathing Techniques Group instruction that is supported by demonstration and informational handouts. Instructor discusses the benefits of pursed lip and diaphragmatic breathing and detailed demonstration on how to perform both.     Risk Factor Reduction Group instruction that is supported by a PowerPoint presentation. Instructor discusses the definition of a risk factor, different risk factors for pulmonary disease, and how the heart and lungs work together.   MD Day A group question and answer session with a medical doctor that allows participants to ask questions that relate to their pulmonary disease state.   Nutrition for the Pulmonary Patient Group instruction provided by PowerPoint slides, verbal discussion, and written materials to support subject matter. The instructor gives an explanation and review of healthy diet recommendations, which includes a discussion on weight management, recommendations for fruit and vegetable consumption, as well as protein, fluid, caffeine, fiber, sodium, sugar, and alcohol. Tips for eating when patients are short of breath are discussed.    Other Education Group or individual verbal, written, or video instructions that support the educational goals of the pulmonary rehab program.    Knowledge Questionnaire Score:   Core Components/Risk Factors/Patient Goals at Admission:  Personal Goals and Risk Factors at Admission - 07/24/22 1057       Core Components/Risk Factors/Patient Goals on Admission    Weight Management Weight Loss     Improve shortness of breath with ADL's Yes    Intervention Provide education, individualized exercise plan and daily activity instruction to help decrease symptoms of SOB with activities of daily living.    Expected Outcomes Short Term: Improve cardiorespiratory fitness to achieve a reduction of symptoms when performing ADLs;Long Term: Be able to perform more ADLs without symptoms or delay the onset of symptoms    Increase knowledge of respiratory medications and ability to use respiratory devices properly  Yes    Intervention Provide education and demonstration as needed of appropriate use of medications, inhalers, and oxygen therapy.    Expected Outcomes Short Term: Achieves understanding of medications use. Understands that oxygen is a medication prescribed by physician. Demonstrates appropriate use of inhaler and oxygen therapy.;Long Term: Maintain appropriate use of medications, inhalers, and oxygen therapy.             Core Components/Risk Factors/Patient Goals Review:   Goals and Risk Factor Review     Row Name 07/31/22 1147 08/28/22 0929 09/25/22 1512         Core Components/Risk Factors/Patient Goals Review   Personal Goals Review Develop more efficient breathing techniques such as purse lipped breathing and diaphragmatic breathing and practicing self-pacing with activity.;Increase knowledge of respiratory medications and ability to use respiratory devices properly.;Improve shortness of breath with ADL's Develop more efficient breathing techniques such as purse lipped breathing and diaphragmatic breathing and practicing self-pacing with activity.;Increase knowledge of respiratory medications and ability to use respiratory devices properly.;Improve shortness of breath with ADL's;Weight Management/Obesity Weight Management/Obesity;Improve shortness of breath with ADL's;Develop more efficient breathing techniques such as purse lipped breathing and diaphragmatic breathing and practicing  self-pacing with activity.;Increase knowledge of respiratory medications and ability to use respiratory devices properly.     Review Arlynne has attended 1 PR class so far. She is currently exercising on the track and level 1 on the Octane. We  will continue any progress she continues to make. Amariss has completed 6 PR sessions so far. She is currently exercising by walking the track and exercising on the Nustep. She has made minimal progress so far. Her oxygen saturations have been stable on 6L. She is practicing pursed lip breathing. Jillanne has lost about 5 pounds since starting a new medication and exercising. She knows how to report her Rate of Perceived Exertion and her Dyspnea scale to staff. We will continue to assess any progress. Jakalyn has completed 13 PR sessions so far. She is currently exercising by walking the track and exercising on the Nustep. She has been able to increase her workload and MET's on the Nustep, but has made minimal progress while walking the track.Maurya wears 6-8L while exercising with sats 92-95%. She is practicing pursed lip breathing. Jenisis has lost 16 pounds since starting a new medication and exercising. She knows how to report her Rate of Perceived Exertion and her Dyspnea scale to staff. Cassundra has attended classes on know your numbers, oxygen safety and oxygen use. Avienda is able to demonstrate proper use of her respiratory medications. Bettey likes coming to class and feels like it is helping her get back to being able to be more independent . We will continue to assess any progress.     Expected Outcomes See admission goals See admission goals See admission goals              Core Components/Risk Factors/Patient Goals at Discharge (Final Review):   Goals and Risk Factor Review - 09/25/22 1512       Core Components/Risk Factors/Patient Goals Review   Personal Goals Review Weight Management/Obesity;Improve shortness of breath with ADL's;Develop more efficient breathing techniques such as  purse lipped breathing and diaphragmatic breathing and practicing self-pacing with activity.;Increase knowledge of respiratory medications and ability to use respiratory devices properly.    Review Adine has completed 13 PR sessions so far. She is currently exercising by walking the track and exercising on the Nustep. She has been able to increase her workload and MET's on the Nustep, but has made minimal progress while walking the track.Evon wears 6-8L while exercising with sats 92-95%. She is practicing pursed lip breathing. Mauricia has lost 16 pounds since starting a new medication and exercising. She knows how to report her Rate of Perceived Exertion and her Dyspnea scale to staff. Coralene has attended classes on know your numbers, oxygen safety and oxygen use. Emanuelly is able to demonstrate proper use of her respiratory medications. Kaylean likes coming to class and feels like it is helping her get back to being able to be more independent . We will continue to assess any progress.    Expected Outcomes See admission goals             ITP Comments:Pt is making expected progress toward Pulmonary Rehab goals after completing 15 sessions. Recommend continued exercise, life style modification, education, and utilization of breathing techniques to increase stamina and strength, while also decreasing shortness of breath with exertion.  Dr. Mechele Collin is Medical Director for Pulmonary Rehab at [facility].     Comments: Dr. Mechele Collin is Medical Director for Pulmonary Rehab at [facility].

## 2022-10-10 ENCOUNTER — Encounter (HOSPITAL_COMMUNITY): Payer: Medicare Other

## 2022-10-10 ENCOUNTER — Telehealth (HOSPITAL_COMMUNITY): Payer: Self-pay

## 2022-10-10 ENCOUNTER — Telehealth: Payer: Self-pay

## 2022-10-10 NOTE — Telephone Encounter (Signed)
Left message to schedule 6 MWT

## 2022-10-10 NOTE — Progress Notes (Signed)
Care Management & Coordination Services Pharmacy Team  Reason for Encounter: Appointment Reminder  Contacted patient to confirm telephone appointment with Delano Metz, PharmD on 10/11/2022 at 3:00. Spoke with patient on 10/10/2022   Do you have any problems getting your medications?   What is your top health concern you would like to discuss at your upcoming visit?   Have you seen any other providers since your last visit with PCP?   Care Gaps: AWV - completed Last eye exam - 05/10/2021 (patient states last eye exam Dec 2023)  Last foot exam - 04/15/2022 Last BP - 114/62 on 09/05/2022 Last A1C - 6.4 on 04/15/2022 Tdap - overdue Urine ACR - overdue Covid - overdue  Star Rating Drug: Dapagliflozin 10 mg - filled through PAP Metformin 500mg  - [facility] Rosuvastatin 20 mg - last filled [facility]/16/2022 [facility]  Inetta Fermo [facility]  Clinical Pharmacist Assistant [phone]

## 2022-10-10 NOTE — Progress Notes (Unsigned)
Care Management & Coordination Services Pharmacy Note  10/10/2022 Name:  Rebecca Mcintyre MRN:  [id number] DOB:  12-05-1945  Summary: -Pt due for urine microalbumin screen at next OV (pt declined to sch at this time) -A1C at goal <7.0 and BP at goal <130/80 -Breathing well-controlled with [name]  Recommendations/Changes made from today's visit: -Continue medication therapy -Continue twice daily BG monitoring -Farxiga at pharmacy and not through PAP for 2024, using [facility] copay card -Encouraged scheduling routine f/u appt with PCP  Follow up plan: Pharmacist visit in 3 months   Subjective: Rebecca Mcintyre is an 77 y.o. year old female who is a primary patient of Panosh, Neta Mends, MD.  The care coordination team was consulted for assistance with disease management and care coordination needs.    Engaged with patient by telephone for follow up visit.  Recent office visits: None  Hospital visits: None in previous 6 months   Objective:  Lab Results  Component Value Date   CREATININE 1.35 (H) 05/30/2022   BUN 33 (H) 05/30/2022   GFR 55.20 (L) 01/14/2022   EGFR 74 07/05/2021   GFRNONAA 41 (L) 05/30/2022   GFRAA >60 11/12/2018   NA 134 (L) 05/30/2022   K 3.2 (L) 05/30/2022   CALCIUM 9.0 05/30/2022   CO2 32 05/30/2022   GLUCOSE 130 (H) 05/30/2022    Lab Results  Component Value Date/Time   HGBA1C 6.4 (A) 04/15/2022 12:18 PM   HGBA1C 7.5 (H) 04/10/2021 10:25 AM   HGBA1C 7.4 (H) 10/03/2020 09:42 AM   GFR 55.20 (L) 01/14/2022 03:22 PM   GFR 52.76 (L) 10/03/2021 11:26 AM   MICROALBUR 2.8 (H) 03/27/2020 08:06 AM   MICROALBUR 0.7 04/02/2019 09:05 AM    Last diabetic Eye exam:  Lab Results  Component Value Date/Time   HMDIABEYEEXA No Retinopathy 05/10/2021 12:00 AM    Last diabetic Foot exam: No results found for: "HMDIABFOOTEX"   Lab Results  Component Value Date   CHOL 172 06/04/2022   HDL 59 06/04/2022   LDLCALC 76 06/04/2022   LDLDIRECT 116.0 02/05/2019    TRIG 229 (H) 06/04/2022   CHOLHDL 2.9 06/04/2022       Latest Ref Rng & Units 05/25/2022   12:30 PM 10/03/2021   11:26 AM 11/28/2020    3:44 PM  Hepatic Function  Total Protein 6.5 - 8.1 g/dL 7.4  7.8  6.3   Albumin 3.5 - 5.0 g/dL 3.8  4.3  3.1   AST 15 - 41 U/L 30  21  28    ALT 0 - 44 U/L 28  17  25    Alk Phosphatase 38 - 126 U/L 75  80  67   Total Bilirubin 0.3 - 1.2 mg/dL 0.6  0.5  0.7     Lab Results  Component Value Date/Time   TSH 1.630 01/31/2022 12:33 PM   TSH 3.354 11/29/2020 12:03 AM   TSH 3.37 10/03/2020 09:42 AM   FREET4 0.81 10/03/2020 09:42 AM       Latest Ref Rng & Units 05/28/2022    5:31 AM 05/27/2022    2:16 PM 05/27/2022    3:46 AM  CBC  WBC 4.0 - 10.5 K/uL 11.7  11.6  12.3   Hemoglobin 12.0 - 15.0 g/dL 82.9  56.2  13.0   Hematocrit 36.0 - 46.0 % 41.4  43.1  41.0   Platelets 150 - 400 K/uL 169  220  176     Lab Results  Component Value Date/Time  ZOXWRUEA54 696 11/29/2020 12:03 AM   VITAMINB12 232 10/03/2020 09:42 AM    Clinical ASCVD: Yes  The ASCVD Risk score (Arnett DK, et al., 2019) failed to calculate for the following reasons:   The patient has a prior MI or stroke diagnosis       07/24/2022   11:00 AM 04/15/2022   11:27 AM 04/03/2022    8:28 AM  Depression screen PHQ 2/9  Decreased Interest 0 0 0  Down, Depressed, Hopeless 0 0 0  PHQ - 2 Score 0 0 0  Altered sleeping 0 0   Tired, decreased energy 0 1   Change in appetite 0 0   Feeling bad or failure about yourself  0 0   Trouble concentrating 0 0   Moving slowly or fidgety/restless 0 0   Suicidal thoughts 0 0   PHQ-9 Score 0 1   Difficult doing work/chores  Somewhat difficult      Social History   Tobacco Use  Smoking Status Former   Packs/day: 1.00   Years: 39.00   Additional pack years: 0.00   Total pack years: 39.00   Types: Cigarettes   Quit date: 11/04/2018   Years since quitting: 3.9  Smokeless Tobacco Never   BP Readings from Last 3 Encounters:  09/05/22 114/62   07/24/22 (!) 102/56  06/04/22 126/62   Pulse Readings from Last 3 Encounters:  09/05/22 91  07/24/22 90  06/04/22 82   Wt Readings from Last 3 Encounters:  10/01/22 210 lb 15.7 oz (95.7 kg)  09/24/22 211 lb 3.2 oz (95.8 kg)  09/10/22 216 lb 14.9 oz (98.4 kg)   BMI Readings from Last 3 Encounters:  10/01/22 37.37 kg/m  09/24/22 37.41 kg/m  09/10/22 38.43 kg/m    Allergies  Allergen Reactions   Atorvastatin     REACTION: myalgias  and liver    Medications Reviewed Today     Reviewed by Oretha Milch, MD (Physician) on 09/05/22 at 1354  Med List Status: <None>   Medication Order Taking? Sig Documenting Provider Last Dose Status Informant  ACCU-CHEK GUIDE test strip [id number] Yes USE AS DIRECTED TWICE A DAY Panosh, Neta Mends, MD Taking Active Self, Child  Accu-Chek Softclix Lancets lancets [id number] Yes Use as instructed twice a day Diagnosis E11.9 Panosh, Neta Mends, MD Taking Active Self, Child  acetaminophen (TYLENOL) 500 MG tablet [id number] Yes Take 1,000 mg by mouth every 6 (six) hours as needed for mild pain. [provider] Taking Active Child, Self  albuterol (PROVENTIL) (2.5 MG/3ML) 0.083% nebulizer solution [id number] Yes Take 3 mLs (2.5 mg total) by nebulization every 6 (six) hours as needed for wheezing or shortness of breath. Rai, Delene Ruffini, MD Taking Active   albuterol (VENTOLIN HFA) 108 (90 Base) MCG/ACT inhaler [id number] Yes USE 2 PUFFS EVERY 6 HOURS AS NEEDED FOR WHEEZE OR SHORTNESS OF BREATH  Patient taking differently: Inhale 2 puffs into the lungs every 6 (six) hours as needed for wheezing or shortness of breath.   Oretha Milch, MD Taking Active Self, Child  allopurinol (ZYLOPRIM) 100 MG tablet [id number] Yes TAKE 1 TABLET BY MOUTH TWICE A DAY Worthy Rancher B, FNP Taking Active   arformoterol (BROVANA) 15 MCG/2ML NEBU [id number] Yes Take 2 mLs (15 mcg total) by nebulization 2 (two) times daily. Cathren Harsh, MD Taking Active   aspirin 81 MG tablet  [id number] Yes Take 81 mg by mouth daily. [provider] Taking Active Child, Self  Blood Glucose Monitoring Suppl (  ACCU-CHEK GUIDE) w/Device KIT [id number] Yes Use as directed twice a day Diagnosis E11.9 Panosh, Neta Mends, MD Taking Active Self, Child  budesonide (PULMICORT) 0.5 MG/2ML nebulizer solution [id number] Yes Take 2 mLs (0.5 mg total) by nebulization 2 (two) times daily. Rai, Delene Ruffini, MD Taking Active   calcium carbonate (TUMS EX) 750 MG chewable tablet [id number] Yes Chew 750 mg by mouth daily as needed for heartburn. [provider] Taking Active Self, Child  dapagliflozin propanediol (FARXIGA) 10 MG TABS tablet [id number] Yes Take 1 tablet (10 mg total) by mouth daily. Worthy Rancher B, FNP Taking Active   furosemide (LASIX) 40 MG tablet [id number] Yes TAKE 1 TABLET BY MOUTH EVERY DAY Worthy Rancher B, FNP Taking Active   guaiFENesin (MUCINEX) 600 MG 12 hr tablet [id number] Yes Take 2 tablets (1,200 mg total) by mouth 2 (two) times daily. Cathren Harsh, MD Taking Active   KLOR-CON M10 10 MEQ tablet [id number] Yes TAKE 1 TABLET BY MOUTH TWICE A DAY Panosh, Neta Mends, MD Taking Active   metFORMIN (GLUCOPHAGE) 500 MG tablet [id number] Yes Take 2 tablets (1,000 mg total) by mouth in the morning and at bedtime. Panosh, Neta Mends, MD Taking Active   metoprolol tartrate (LOPRESSOR) 25 MG tablet [id number] Yes TAKE 1/2 TABLETS BY MOUTH 2 TIMES DAILY Worthy Rancher B, FNP Taking Active   nitroGLYCERIN (NITROSTAT) 0.4 MG SL tablet [id number] Yes PLACE 1 TABLET UNDER THE TONGUE EVERY 5 MINUTES AS NEEDED.  Patient taking differently: Place 0.4 mg under the tongue every 5 (five) minutes as needed for chest pain.   Panosh, Neta Mends, MD Taking Active Child, Self  Polyethylene Glycol 400 (VISINE DRY EYE RELIEF OP) [id number] Yes Place 1 drop into both eyes daily as needed (dry eyes). [provider] Taking Active Self, Child  revefenacin (YUPELRI) 175 MCG/3ML nebulizer solution [id number] Yes  Take 3 mLs (175 mcg total) by nebulization daily. Oretha Milch, MD Taking Active   rOPINIRole (REQUIP) 0.5 MG tablet [id number] Yes TAKE 1 TABLET AT BEDTIME FOR RESTLESS LEG Oretha Milch, MD Taking Active   rosuvastatin (CRESTOR) 20 MG tablet [id number] Yes Take 1 tablet (20 mg total) by mouth daily.  Patient taking differently: Take 20 mg by mouth at bedtime.   Rollene Rotunda, MD Taking Active Self, Child  tirzepatide [facility]) 7.5 MG/0.37ML Pen [id number] Yes Inject 7.5 mg into the skin once a week. Rollene Rotunda, MD Taking Active             SDOH:  (Social Determinants of Health) assessments and interventions performed: Yes SDOH Interventions    Flowsheet Row Telephone from 05/31/2022 in [facility] Community Care Coordination ED to Hosp-Admission (Discharged) from 05/25/2022 in [facility] Clinical Support from 04/03/2022 in [facility] [place] Chronic Care Management from 03/12/2022 in [facility] [place] Patient Outreach Telephone from 12/24/2021 in [facility] Care Coordination Chronic Care Management from 04/06/2021 in [facility] [place]  SDOH Interventions        Food Insecurity Interventions Intervention Not Indicated -- Intervention Not Indicated -- Intervention Not Indicated Intervention Not Indicated  Housing Interventions -- -- Intervention Not Indicated -- Intervention Not Indicated Intervention Not Indicated  Transportation Interventions Intervention Not Indicated Intervention Not Indicated, Inpatient TOC, Patient Resources (Friends/Family) Intervention Not Indicated Intervention Not Indicated Intervention Not Indicated Intervention Not Indicated  Utilities Interventions -- -- Intervention Not Indicated -- -- --  Alcohol Usage Interventions -- --  Intervention Not Indicated (Score <7) -- -- --  Financial Strain Interventions -- -- Intervention  Not Indicated -- -- --  [working with pharmacist for assistance with Farxiga]  Physical Activity Interventions -- -- Intervention Not Indicated -- -- --  Stress Interventions -- -- Intervention Not Indicated -- -- Intervention Not Indicated  Social Connections Interventions -- -- Intervention Not Indicated -- -- --       Medication Assistance:  Breztri approved through [employer] and [place] through [employer] for 2024.  Medication Access: Within the past 30 days, how often has patient missed a dose of medication? None Is a pillbox or other method used to improve adherence? Yes  Factors that may affect medication adherence? financial need Are meds synced by current pharmacy? No  Are meds delivered by current pharmacy? No  Does patient experience delays in picking up medications due to transportation concerns? No   [facility] Reviewed: Is patient disadvantaged to use [facility]?: Yes  Current Rx insurance plan: [facility] Name and location of Current pharmacy:  [id number] [place], [place] [address] [place] [address] Phone: [phone] Fax: [phone]  [facility] [place], [place] - [address] [place] [address] Phone: [phone] Fax: [phone]  [place] [facility] [address] [place] [address] Phone: [phone] Fax: [phone]  [facility] services reviewed with patient today?: No  Patient requests to transfer care to [facility]?: No  Reason patient declined to change pharmacies: Not mentioned at this visit  Compliance/Adherence/Medication fill history: Care Gaps: Urine microalbumin past due Diabetic eye exam Vaccines: COVID, Tdap  Star-Rating Drugs: Farxiga 10mg  - gets on PAP Metformin 1000mg  PDC 98% Rosuvastatin 20mg  PDC 100%   Assessment/Plan   Diabetes (A1c goal <7%) -Controlled -Current medications: Farxiga 10mg  1 tab daily  Appropriate, Effective, Safe, Accessible Metformin 1000mg  BID Appropriate, Effective, Safe, Accessible Mounjaro 2.5mg  into skin each week Appropriate, Effective, Safe, Accessible -Medications previously tried: None  -Current home glucose readings fasting glucose: 94 this morning post prandial glucose: 118 last night before bed -Denies hypoglycemic/hyperglycemic symptoms -Current meal patterns: Not discussed -Current exercise: Not discussed -Educated on A1c and blood sugar goals; Complications of diabetes including kidney damage, retinal damage, and cardiovascular disease; Benefits of weight loss; Prevention and management of hypoglycemic episodes; -Counseled to check feet daily and get yearly eye exams -Recommended to continue current medication Assessed patient finances. Denied [place] PAP for 2024, approved for [employer] for 2024  COPD (Goal: control symptoms and prevent exacerbations) -Controlled -Current treatment  Breztri 2 puffs twice daily Appropriate, Effective, Safe, Accessible -Medications previously tried: Trelegy, Yong Channel  -Gold Grade: Gold 2 (FEV1 50-79%) -Current COPD Classification:  A (low sx, 0-1 moderate exacerbations, no hospitalizations) -Exacerbations requiring treatment in last 6 months: None -Patient reports consistent use of maintenance inhaler -Frequency of rescue inhaler use: less than once weekly -Counseled on Proper inhaler technique; Differences between maintenance and rescue inhalers -Recommended to continue current medication Assessed patient finances. Gets breztri through [employer] for 2024  Sherrill Raring Clinical Pharmacist [phone]

## 2022-10-15 ENCOUNTER — Telehealth (HOSPITAL_COMMUNITY): Payer: Self-pay

## 2022-10-15 ENCOUNTER — Encounter (HOSPITAL_COMMUNITY): Admission: RE | Admit: 2022-10-15 | Payer: Medicare Other | Source: Ambulatory Visit

## 2022-10-15 NOTE — Telephone Encounter (Signed)
Called Rebecca Mcintyre to check on her. She stated that she was out of town. Reminded her of the graduation date for 5/23. [name] mentioned that she would not be in rehab 5/23. She agreed and understood that she will be discharged from Pulmonary Rehab.

## 2022-10-16 NOTE — Progress Notes (Signed)
Discharge Progress Report  Patient Details  Name: Rebecca Mcintyre MRN: [id number] Date of Birth: [birth year] Referring Provider:   Doristine Devoid Pulmonary Rehab Walk Test from 07/24/2022 in [facility]  Referring Provider Alva        Number of Visits: 15  Reason for Discharge:  Early Exit:  Lack of attendance  Smoking History:  Social History   Tobacco Use  Smoking Status Former   Packs/day: 1.00   Years: 39.00   Additional pack years: 0.00   Total pack years: 39.00   Types: Cigarettes   Quit date: 11/04/2018   Years since quitting: 3.9  Smokeless Tobacco Never    Diagnosis:  Stage 2 moderate COPD by GOLD classification (HCC)  ADL UCSD:  Pulmonary Assessment Scores     Row Name 07/24/22 1055         ADL UCSD   SOB Score total 81       CAT Score   CAT Score 18       mMRC Score   mMRC Score 4              Initial Exercise Prescription:  Initial Exercise Prescription - 07/24/22 1200       Date of Initial Exercise RX and Referring Provider   Date 07/24/22    Referring Provider Vassie Loll    Expected Discharge Date 10/17/22      Oxygen   Oxygen Continuous    Liters 8    Maintain Oxygen Saturation 88% or higher      NuStep   Level --    SPM --    Minutes --      Recumbant Elliptical   Level 1    Watts 20    Minutes 15    METs 2      Track   Minutes 15      Prescription Details   Frequency (times per week) 2    Duration Progress to 30 minutes of continuous aerobic without signs/symptoms of physical distress      Intensity   THRR 40-80% of Max Heartrate 58-116    Ratings of Perceived Exertion 11-13    Perceived Dyspnea 0-4      Progression   Progression Continue to progress workloads to maintain intensity without signs/symptoms of physical distress.      Resistance Training   Training Prescription Yes    Weight red bands    Reps 10-15             Discharge Exercise  Prescription (Final Exercise Prescription Changes):  Exercise Prescription Changes - 10/01/22 1209       Response to Exercise   Blood Pressure (Admit) 124/60    Blood Pressure (Exit) 100/60    Heart Rate (Admit) 92 bpm    Heart Rate (Exercise) 105 bpm    Heart Rate (Exit) 100 bpm    Oxygen Saturation (Admit) 99 %    Oxygen Saturation (Exercise) 91 %    Oxygen Saturation (Exit) 95 %    Rating of Perceived Exertion (Exercise) 14    Perceived Dyspnea (Exercise) 3    Duration Continue with 30 min of aerobic exercise without signs/symptoms of physical distress.    Intensity THRR unchanged      Progression   Progression Continue to progress workloads to maintain intensity without signs/symptoms of physical distress.      Resistance Training   Training Prescription Yes    Weight blue bands  Reps 10-15    Time 10 Minutes      Oxygen   Oxygen Continuous    Liters 6-8      NuStep   Level 3    Minutes 15    METs 2      Track   Laps 7    Minutes 15    METs 2.08      Oxygen   Maintain Oxygen Saturation 88% or higher             Functional Capacity:  6 Minute Walk     Row Name 07/24/22 1220         6 Minute Walk   Phase Initial     Distance 720 feet     Walk Time 6 minutes     # of Rest Breaks 0     MPH 1.36     METS 1.06     RPE 13     Perceived Dyspnea  2     VO2 Peak 3.72     Resting HR 95 bpm     Resting BP 102/56     Resting Oxygen Saturation  93 %     Exercise Oxygen Saturation  during 6 min walk 87 %     Max Ex. HR 109 bpm     Max Ex. BP 142/70     2 Minute Post BP 128/68       Interval HR   1 Minute HR 101     2 Minute HR 108     3 Minute HR 105     4 Minute HR 103     5 Minute HR 108     6 Minute HR 109     2 Minute Post HR 100     Interval Heart Rate? Yes       Interval Oxygen   Interval Oxygen? Yes     Baseline Oxygen Saturation % 93 %     1 Minute Oxygen Saturation % 94 %     1 Minute Liters of Oxygen 6 L     2 Minute Oxygen  Saturation % 88 %  31sec, 87%     2 Minute Liters of Oxygen 6 L  31sec, increased to 8L     3 Minute Oxygen Saturation % 91 %     3 Minute Liters of Oxygen 8 L     4 Minute Oxygen Saturation % 90 %     4 Minute Liters of Oxygen 8 L     5 Minute Oxygen Saturation % 90 %     5 Minute Liters of Oxygen 8 L     6 Minute Oxygen Saturation % 89 %     6 Minute Liters of Oxygen 8 L     2 Minute Post Oxygen Saturation % 97 %     2 Minute Post Liters of Oxygen 8 L              Psychological, QOL, Others - Outcomes: PHQ 2/9:    07/24/2022   11:00 AM 04/15/2022   11:27 AM 04/03/2022    8:28 AM 12/24/2021    2:40 PM 05/22/2021    2:38 PM  Depression screen PHQ 2/9  Decreased Interest 0 0 0 0 0  Down, Depressed, Hopeless 0 0 0 0 0  PHQ - 2 Score 0 0 0 0 0  Altered sleeping 0 0   0  Tired, decreased energy 0 1  0  Change in appetite 0 0   0  Feeling bad or failure about yourself  0 0   0  Trouble concentrating 0 0   0  Moving slowly or fidgety/restless 0 0   0  Suicidal thoughts 0 0   0  PHQ-9 Score 0 1   0  Difficult doing work/chores  Somewhat difficult       Quality of Life:   Personal Goals: Goals established at orientation with interventions provided to work toward goal.  Personal Goals and Risk Factors at Admission - 07/24/22 1057       Core Components/Risk Factors/Patient Goals on Admission    Weight Management Weight Loss    Improve shortness of breath with ADL's Yes    Intervention Provide education, individualized exercise plan and daily activity instruction to help decrease symptoms of SOB with activities of daily living.    Expected Outcomes Short Term: Improve cardiorespiratory fitness to achieve a reduction of symptoms when performing ADLs;Long Term: Be able to perform more ADLs without symptoms or delay the onset of symptoms    Increase knowledge of respiratory medications and ability to use respiratory devices properly  Yes    Intervention Provide  education and demonstration as needed of appropriate use of medications, inhalers, and oxygen therapy.    Expected Outcomes Short Term: Achieves understanding of medications use. Understands that oxygen is a medication prescribed by physician. Demonstrates appropriate use of inhaler and oxygen therapy.;Long Term: Maintain appropriate use of medications, inhalers, and oxygen therapy.              Personal Goals Discharge:  Goals and Risk Factor Review     Row Name 07/31/22 1147 08/28/22 0929 09/25/22 1512         Core Components/Risk Factors/Patient Goals Review   Personal Goals Review Develop more efficient breathing techniques such as purse lipped breathing and diaphragmatic breathing and practicing self-pacing with activity.;Increase knowledge of respiratory medications and ability to use respiratory devices properly.;Improve shortness of breath with ADL's Develop more efficient breathing techniques such as purse lipped breathing and diaphragmatic breathing and practicing self-pacing with activity.;Increase knowledge of respiratory medications and ability to use respiratory devices properly.;Improve shortness of breath with ADL's;Weight Management/Obesity Weight Management/Obesity;Improve shortness of breath with ADL's;Develop more efficient breathing techniques such as purse lipped breathing and diaphragmatic breathing and practicing self-pacing with activity.;Increase knowledge of respiratory medications and ability to use respiratory devices properly.     Review Jamisa has attended 1 PR class so far. She is currently exercising on the track and level 1 on the Octane. We will continue any progress she continues to make. Lakin has completed 6 PR sessions so far. She is currently exercising by walking the track and exercising on the Nustep. She has made minimal progress so far. Her oxygen saturations have been stable on 6L. She is practicing pursed lip breathing. Xiana has lost about 5 pounds since  starting a new medication and exercising. She knows how to report her Rate of Perceived Exertion and her Dyspnea scale to staff. We will continue to assess any progress. Nisi has completed 13 PR sessions so far. She is currently exercising by walking the track and exercising on the Nustep. She has been able to increase her workload and MET's on the Nustep, but has made minimal progress while walking the track.Desirree wears 6-8L while exercising with sats 92-95%. She is practicing pursed lip breathing. Karsen has lost 16 pounds since starting a new medication and exercising.  She knows how to report her Rate of Perceived Exertion and her Dyspnea scale to staff. Ebenezer has attended classes on know your numbers, oxygen safety and oxygen use. Kaelee is able to demonstrate proper use of her respiratory medications. Carlota likes coming to class and feels like it is helping her get back to being able to be more independent . We will continue to assess any progress.     Expected Outcomes See admission goals See admission goals See admission goals              Exercise Goals and Review:  Exercise Goals     Row Name 07/24/22 1234 08/02/22 0805 08/29/22 0704 10/03/22 1629       Exercise Goals   Increase Physical Activity Yes Yes Yes Yes    Intervention Develop an individualized exercise prescription for aerobic and resistive training based on initial evaluation findings, risk stratification, comorbidities and participant's personal goals.;Provide advice, education, support and counseling about physical activity/exercise needs. Develop an individualized exercise prescription for aerobic and resistive training based on initial evaluation findings, risk stratification, comorbidities and participant's personal goals.;Provide advice, education, support and counseling about physical activity/exercise needs. Develop an individualized exercise prescription for aerobic and resistive training based on initial evaluation findings, risk  stratification, comorbidities and participant's personal goals.;Provide advice, education, support and counseling about physical activity/exercise needs. Develop an individualized exercise prescription for aerobic and resistive training based on initial evaluation findings, risk stratification, comorbidities and participant's personal goals.;Provide advice, education, support and counseling about physical activity/exercise needs.    Expected Outcomes Long Term: Add in home exercise to make exercise part of routine and to increase amount of physical activity.;Short Term: Attend rehab on a regular basis to increase amount of physical activity.;Long Term: Exercising regularly at least 3-5 days a week. Long Term: Add in home exercise to make exercise part of routine and to increase amount of physical activity.;Short Term: Attend rehab on a regular basis to increase amount of physical activity.;Long Term: Exercising regularly at least 3-5 days a week. Long Term: Add in home exercise to make exercise part of routine and to increase amount of physical activity.;Short Term: Attend rehab on a regular basis to increase amount of physical activity.;Long Term: Exercising regularly at least 3-5 days a week. Long Term: Add in home exercise to make exercise part of routine and to increase amount of physical activity.;Short Term: Attend rehab on a regular basis to increase amount of physical activity.;Long Term: Exercising regularly at least 3-5 days a week.    Increase Strength and Stamina Yes Yes Yes Yes    Intervention Provide advice, education, support and counseling about physical activity/exercise needs.;Develop an individualized exercise prescription for aerobic and resistive training based on initial evaluation findings, risk stratification, comorbidities and participant's personal goals. Provide advice, education, support and counseling about physical activity/exercise needs.;Develop an individualized exercise  prescription for aerobic and resistive training based on initial evaluation findings, risk stratification, comorbidities and participant's personal goals. Provide advice, education, support and counseling about physical activity/exercise needs.;Develop an individualized exercise prescription for aerobic and resistive training based on initial evaluation findings, risk stratification, comorbidities and participant's personal goals. Provide advice, education, support and counseling about physical activity/exercise needs.;Develop an individualized exercise prescription for aerobic and resistive training based on initial evaluation findings, risk stratification, comorbidities and participant's personal goals.    Expected Outcomes Short Term: Increase workloads from initial exercise prescription for resistance, speed, and METs.;Short Term: Perform resistance training exercises routinely during rehab and  add in resistance training at home;Long Term: Improve cardiorespiratory fitness, muscular endurance and strength as measured by increased METs and functional capacity ( ) Short Term: Increase workloads from initial exercise prescription for resistance, speed, and METs.;Short Term: Perform resistance training exercises routinely during rehab and add in resistance training at home;Long Term: Improve cardiorespiratory fitness, muscular endurance and strength as measured by increased METs and functional capacity ( ) Short Term: Increase workloads from initial exercise prescription for resistance, speed, and METs.;Short Term: Perform resistance training exercises routinely during rehab and add in resistance training at home;Long Term: Improve cardiorespiratory fitness, muscular endurance and strength as measured by increased METs and functional capacity ( ) Short Term: Increase workloads from initial exercise prescription for resistance, speed, and METs.;Short Term: Perform resistance training exercises routinely  during rehab and add in resistance training at home;Long Term: Improve cardiorespiratory fitness, muscular endurance and strength as measured by increased METs and functional capacity ( )    Able to understand and use rate of perceived exertion (RPE) scale Yes Yes Yes Yes    Intervention Provide education and explanation on how to use RPE scale Provide education and explanation on how to use RPE scale Provide education and explanation on how to use RPE scale Provide education and explanation on how to use RPE scale    Expected Outcomes Short Term: Able to use RPE daily in rehab to express subjective intensity level;Long Term:  Able to use RPE to guide intensity level when exercising independently Short Term: Able to use RPE daily in rehab to express subjective intensity level;Long Term:  Able to use RPE to guide intensity level when exercising independently Short Term: Able to use RPE daily in rehab to express subjective intensity level;Long Term:  Able to use RPE to guide intensity level when exercising independently Short Term: Able to use RPE daily in rehab to express subjective intensity level;Long Term:  Able to use RPE to guide intensity level when exercising independently    Able to understand and use Dyspnea scale Yes Yes Yes Yes    Intervention Provide education and explanation on how to use Dyspnea scale Provide education and explanation on how to use Dyspnea scale Provide education and explanation on how to use Dyspnea scale Provide education and explanation on how to use Dyspnea scale    Expected Outcomes Short Term: Able to use Dyspnea scale daily in rehab to express subjective sense of shortness of breath during exertion;Long Term: Able to use Dyspnea scale to guide intensity level when exercising independently Short Term: Able to use Dyspnea scale daily in rehab to express subjective sense of shortness of breath during exertion;Long Term: Able to use Dyspnea scale to guide intensity level  when exercising independently Short Term: Able to use Dyspnea scale daily in rehab to express subjective sense of shortness of breath during exertion;Long Term: Able to use Dyspnea scale to guide intensity level when exercising independently Short Term: Able to use Dyspnea scale daily in rehab to express subjective sense of shortness of breath during exertion;Long Term: Able to use Dyspnea scale to guide intensity level when exercising independently    Knowledge and understanding of Target Heart Rate Range (THRR) Yes Yes Yes Yes    Intervention Provide education and explanation of THRR including how the numbers were predicted and where they are located for reference Provide education and explanation of THRR including how the numbers were predicted and where they are located for reference Provide education and explanation of THRR including how the numbers were predicted  and where they are located for reference Provide education and explanation of THRR including how the numbers were predicted and where they are located for reference    Expected Outcomes Short Term: Able to state/look up THRR;Short Term: Able to use daily as guideline for intensity in rehab;Long Term: Able to use THRR to govern intensity when exercising independently Short Term: Able to state/look up THRR;Short Term: Able to use daily as guideline for intensity in rehab;Long Term: Able to use THRR to govern intensity when exercising independently Short Term: Able to state/look up THRR;Short Term: Able to use daily as guideline for intensity in rehab;Long Term: Able to use THRR to govern intensity when exercising independently Short Term: Able to state/look up THRR;Short Term: Able to use daily as guideline for intensity in rehab;Long Term: Able to use THRR to govern intensity when exercising independently    Understanding of Exercise Prescription Yes Yes Yes Yes    Intervention Provide education, explanation, and written materials on patient's  individual exercise prescription Provide education, explanation, and written materials on patient's individual exercise prescription Provide education, explanation, and written materials on patient's individual exercise prescription Provide education, explanation, and written materials on patient's individual exercise prescription    Expected Outcomes Short Term: Able to explain program exercise prescription;Long Term: Able to explain home exercise prescription to exercise independently Short Term: Able to explain program exercise prescription;Long Term: Able to explain home exercise prescription to exercise independently Short Term: Able to explain program exercise prescription;Long Term: Able to explain home exercise prescription to exercise independently Short Term: Able to explain program exercise prescription;Long Term: Able to explain home exercise prescription to exercise independently             Exercise Goals Re-Evaluation:  Exercise Goals Re-Evaluation     Row Name 08/02/22 0806 08/29/22 0704 10/03/22 1629         Exercise Goal Re-Evaluation   Exercise Goals Review Increase Physical Activity;Able to understand and use Dyspnea scale;Understanding of Exercise Prescription;Increase Strength and Stamina;Knowledge and understanding of Target Heart Rate Range (THRR);Able to understand and use rate of perceived exertion (RPE) scale Increase Physical Activity;Able to understand and use Dyspnea scale;Understanding of Exercise Prescription;Increase Strength and Stamina;Knowledge and understanding of Target Heart Rate Range (THRR);Able to understand and use rate of perceived exertion (RPE) scale Increase Physical Activity;Able to understand and use Dyspnea scale;Understanding of Exercise Prescription;Increase Strength and Stamina;Knowledge and understanding of Target Heart Rate Range (THRR);Able to understand and use rate of perceived exertion (RPE) scale     Comments Bettyann has completed 2 exercise  sessions. Helon exercises for 15 min on the track and Nustep. She averages 1.62 METs on the track and 1.3 METs at level 1 on the Nustep. She performs the warmup and cooldown standing holding onto a rollator for balance. Verta is very deconditioned as she admits to not exercising since the last time she was in rehab. Zendayah was originally exercising on the recumbent elliptical. The recumbent elliptical seemed to be difficult for her, so she was moved to the Nustep. Adalyna seemed to tolerate the Nustep better. Will continue to monitor and progress as able. Alyssha has completed 6 exercise sessions. Danelle exercises for 15 min on the track and Nustep. She averages 1.77 METs on the track and 1.3 METs at level 1 on the Nustep. She performs the warmup and cooldown standing holding onto a rollator for balance. Crescent iis still deconditioned. She has not progressed in track laps or level on the Nustep. Joyce Gross  has also been absent for the past week due to illness.  Makenly remaines positive despite her deconditioning. Will continue to monitor and progress as able. Heran has completed 15 exercise sessions. Dalaya exercises for 15 min on the track and Nustep. She averages 2.08 METs on the track and 2.0 METs at level 3 on the Nustep. She performs the warmup and cooldown standing holding onto a rollator for balance. Zitlally has improved since starting the program. She has increased her workload for both exercise modes as METs have increased. She tolerates progressions well. I have discussed home exercise with her as I encouraged her to get some walking in outside of rehab. Kayleanna has told me that she has increased her home exercise. She is motivated to exercise and improve her functional capacity. Will continue to monitor and progress as able.     Expected Outcomes Through exercise at rehab and at home, the patient will decrease shortness of breath with daily activities and feel confident in carrying out an exercise regime at home. Through exercise at rehab and at  home, the patient will decrease shortness of breath with daily activities and feel confident in carrying out an exercise regime at home. Through exercise at rehab and at home, the patient will decrease shortness of breath with daily activities and feel confident in carrying out an exercise regime at home.              Nutrition & Weight - Outcomes:    Nutrition:  Nutrition Therapy & Goals - 09/24/22 1109       Nutrition Therapy   Diet Heart Healthy Diet    Drug/Food Interactions Statins/Certain Fruits      Personal Nutrition Goals   Nutrition Goal Patient to improve diet quality by using the plate method as a daily guide for meal planning to include lean protein/plant protein, fruits, vegetables, whole grains, nonfat dairy as part of well balanced diet    Personal Goal #2 Patient to identify strategies for weight loss of 0.5-2.0# per week.    Comments Goals in action. She continues Mounjaro to aid with weight loss; she is down 16# since starting with our program. Reviewed goals and benefits of high protein/high fiber intake for weight loss.  Karrah will benefit from participation in pulmonary rehab for nutrition, exercise, and lifestyle modification.      Intervention Plan   Intervention Prescribe, educate and counsel regarding individualized specific dietary modifications aiming towards targeted core components such as weight, hypertension, lipid management, diabetes, heart failure and other comorbidities.;Nutrition handout(s) given to patient.    Expected Outcomes Short Term Goal: Understand basic principles of dietary content, such as calories, fat, sodium, cholesterol and nutrients.;Long Term Goal: Adherence to prescribed nutrition plan.             Nutrition Discharge:   Education Questionnaire Score:   Goals reviewed with patient; copy given to patient.

## 2022-10-17 ENCOUNTER — Encounter (HOSPITAL_COMMUNITY): Payer: Medicare Other

## 2022-10-17 ENCOUNTER — Other Ambulatory Visit (HOSPITAL_COMMUNITY): Payer: Self-pay

## 2022-10-22 ENCOUNTER — Encounter: Payer: Self-pay | Admitting: Pharmacist

## 2022-10-22 ENCOUNTER — Other Ambulatory Visit (HOSPITAL_COMMUNITY): Payer: Self-pay

## 2022-10-22 DIAGNOSIS — E119 Type 2 diabetes mellitus without complications: Secondary | ICD-10-CM

## 2022-10-23 ENCOUNTER — Other Ambulatory Visit (HOSPITAL_COMMUNITY): Payer: Self-pay

## 2022-10-23 MED ORDER — MOUNJARO 10 MG/0.5ML ~~LOC~~ SOAJ
10.0000 mg | SUBCUTANEOUS | 1 refills | Status: DC
  Filled 2022-10-23: qty 2, 28d supply, fill #0

## 2022-10-25 ENCOUNTER — Other Ambulatory Visit: Payer: Self-pay

## 2022-10-25 ENCOUNTER — Other Ambulatory Visit (HOSPITAL_COMMUNITY): Payer: Self-pay

## 2022-10-25 MED ORDER — MOUNJARO 12.5 MG/0.5ML ~~LOC~~ SOAJ
12.5000 mg | SUBCUTANEOUS | 1 refills | Status: DC
  Filled 2022-10-25: qty 2, 28d supply, fill #0
  Filled 2022-11-21: qty 2, 28d supply, fill #1

## 2022-10-25 NOTE — Addendum Note (Signed)
Addended by: Cheree Ditto on: 10/25/2022 02:32 PM   Modules accepted: Orders

## 2022-10-27 DIAGNOSIS — J9611 Chronic respiratory failure with hypoxia: Secondary | ICD-10-CM | POA: Diagnosis not present

## 2022-10-28 ENCOUNTER — Other Ambulatory Visit: Payer: Self-pay | Admitting: Pulmonary Disease

## 2022-10-28 DIAGNOSIS — J9612 Chronic respiratory failure with hypercapnia: Secondary | ICD-10-CM | POA: Diagnosis not present

## 2022-10-28 DIAGNOSIS — J449 Chronic obstructive pulmonary disease, unspecified: Secondary | ICD-10-CM | POA: Diagnosis not present

## 2022-10-28 DIAGNOSIS — J9611 Chronic respiratory failure with hypoxia: Secondary | ICD-10-CM | POA: Diagnosis not present

## 2022-11-16 ENCOUNTER — Other Ambulatory Visit: Payer: Self-pay | Admitting: Pulmonary Disease

## 2022-11-20 ENCOUNTER — Other Ambulatory Visit: Payer: Self-pay | Admitting: Internal Medicine

## 2022-11-21 ENCOUNTER — Other Ambulatory Visit: Payer: Self-pay

## 2022-11-21 ENCOUNTER — Other Ambulatory Visit (HOSPITAL_COMMUNITY): Payer: Self-pay

## 2022-11-21 MED ORDER — MOUNJARO 10 MG/0.5ML ~~LOC~~ SOAJ
10.0000 mg | SUBCUTANEOUS | 2 refills | Status: DC
  Filled 2022-11-21: qty 2, 28d supply, fill #0
  Filled 2022-12-14: qty 2, 28d supply, fill #1
  Filled 2023-01-11: qty 2, 28d supply, fill #2

## 2022-11-21 NOTE — Addendum Note (Signed)
Addended by: Cheree Ditto on: 11/21/2022 10:50 AM   Modules accepted: Orders

## 2022-11-26 DIAGNOSIS — J9611 Chronic respiratory failure with hypoxia: Secondary | ICD-10-CM | POA: Diagnosis not present

## 2022-11-27 ENCOUNTER — Ambulatory Visit (INDEPENDENT_AMBULATORY_CARE_PROVIDER_SITE_OTHER): Payer: Medicare Other | Admitting: Internal Medicine

## 2022-11-27 ENCOUNTER — Encounter: Payer: Self-pay | Admitting: Internal Medicine

## 2022-11-27 VITALS — BP 120/70 | HR 90 | Temp 97.5°F | Ht 63.0 in | Wt 198.4 lb

## 2022-11-27 DIAGNOSIS — Z7984 Long term (current) use of oral hypoglycemic drugs: Secondary | ICD-10-CM

## 2022-11-27 DIAGNOSIS — J449 Chronic obstructive pulmonary disease, unspecified: Secondary | ICD-10-CM | POA: Diagnosis not present

## 2022-11-27 DIAGNOSIS — Z9981 Dependence on supplemental oxygen: Secondary | ICD-10-CM | POA: Diagnosis not present

## 2022-11-27 DIAGNOSIS — E1165 Type 2 diabetes mellitus with hyperglycemia: Secondary | ICD-10-CM

## 2022-11-27 DIAGNOSIS — I10 Essential (primary) hypertension: Secondary | ICD-10-CM | POA: Diagnosis not present

## 2022-11-27 DIAGNOSIS — Z79899 Other long term (current) drug therapy: Secondary | ICD-10-CM | POA: Diagnosis not present

## 2022-11-27 DIAGNOSIS — J9612 Chronic respiratory failure with hypercapnia: Secondary | ICD-10-CM | POA: Diagnosis not present

## 2022-11-27 DIAGNOSIS — Z7985 Long-term (current) use of injectable non-insulin antidiabetic drugs: Secondary | ICD-10-CM | POA: Diagnosis not present

## 2022-11-27 DIAGNOSIS — E785 Hyperlipidemia, unspecified: Secondary | ICD-10-CM

## 2022-11-27 DIAGNOSIS — I5032 Chronic diastolic (congestive) heart failure: Secondary | ICD-10-CM | POA: Diagnosis not present

## 2022-11-27 DIAGNOSIS — J9611 Chronic respiratory failure with hypoxia: Secondary | ICD-10-CM | POA: Diagnosis not present

## 2022-11-27 LAB — HEPATIC FUNCTION PANEL
ALT: 13 U/L (ref 0–35)
AST: 17 U/L (ref 0–37)
Albumin: 4.5 g/dL (ref 3.5–5.2)
Alkaline Phosphatase: 78 U/L (ref 39–117)
Bilirubin, Direct: 0.1 mg/dL (ref 0.0–0.3)
Total Bilirubin: 0.7 mg/dL (ref 0.2–1.2)
Total Protein: 8 g/dL (ref 6.0–8.3)

## 2022-11-27 LAB — CBC WITH DIFFERENTIAL/PLATELET
Basophils Absolute: 0 10*3/uL (ref 0.0–0.1)
Basophils Relative: 0.5 % (ref 0.0–3.0)
Eosinophils Absolute: 0.2 10*3/uL (ref 0.0–0.7)
Eosinophils Relative: 1.7 % (ref 0.0–5.0)
HCT: 40.9 % (ref 36.0–46.0)
Hemoglobin: 13.2 g/dL (ref 12.0–15.0)
Lymphocytes Relative: 29.7 % (ref 12.0–46.0)
Lymphs Abs: 3 10*3/uL (ref 0.7–4.0)
MCHC: 32.2 g/dL (ref 30.0–36.0)
MCV: 89.6 fl (ref 78.0–100.0)
Monocytes Absolute: 0.9 10*3/uL (ref 0.1–1.0)
Monocytes Relative: 9.4 % (ref 3.0–12.0)
Neutro Abs: 5.9 10*3/uL (ref 1.4–7.7)
Neutrophils Relative %: 58.7 % (ref 43.0–77.0)
Platelets: 305 10*3/uL (ref 150.0–400.0)
RBC: 4.57 Mil/uL (ref 3.87–5.11)
RDW: 15.5 % (ref 11.5–15.5)
WBC: 10 10*3/uL (ref 4.0–10.5)

## 2022-11-27 LAB — LIPID PANEL
Cholesterol: 124 mg/dL (ref 0–200)
HDL: 36.9 mg/dL — ABNORMAL LOW (ref >39.00)
LDL Cholesterol: 53 mg/dL (ref 0–99)
NonHDL: 87.59
Total CHOL/HDL Ratio: 3
Triglycerides: 175 mg/dL — ABNORMAL HIGH (ref 0.0–149.0)
VLDL: 35 mg/dL (ref 0.0–40.0)

## 2022-11-27 LAB — BASIC METABOLIC PANEL
BUN: 30 mg/dL — ABNORMAL HIGH (ref 6–23)
CO2: 30 mEq/L (ref 19–32)
Calcium: 10.4 mg/dL (ref 8.4–10.5)
Chloride: 98 mEq/L (ref 96–112)
Creatinine, Ser: 1.48 mg/dL — ABNORMAL HIGH (ref 0.40–1.20)
GFR: 34.27 mL/min — ABNORMAL LOW (ref >60.00)
Glucose, Bld: 88 mg/dL (ref 70–99)
Potassium: 4.1 mEq/L (ref 3.5–5.1)
Sodium: 139 mEq/L (ref 135–145)

## 2022-11-27 LAB — MICROALBUMIN / CREATININE URINE RATIO
Creatinine,U: 108.7 mg/dL
Microalb Creat Ratio: 27.4 mg/g (ref 0.0–30.0)
Microalb, Ur: 29.8 mg/dL — ABNORMAL HIGH (ref 0.0–1.9)

## 2022-11-27 LAB — HEMOGLOBIN A1C: Hgb A1c MFr Bld: 5.9 % (ref 4.6–6.5)

## 2022-11-27 LAB — TSH: TSH: 3.22 u[IU]/mL (ref 0.35–5.50)

## 2022-11-27 MED ORDER — DAPAGLIFLOZIN PROPANEDIOL 10 MG PO TABS: 10.0000 mg | ORAL_TABLET | Freq: Every day | ORAL | 3 refills | Status: DC

## 2022-11-27 NOTE — Progress Notes (Signed)
Chief Complaint  Patient presents with   Medical Management of Chronic Issues    6 month follow up. Wants to discuss decreasing diabetes medications.She will need a refill on Farxiga.    HPI: Rebecca Mcintyre 77 y.o. come in for Chronic disease management  with daughter   Resp : Recent rehab .   Likes and helpful  resp doing better  on reg O2  Had [facility] evaluation  working on  weight loss  Has lost weight  on mounjaro  on farxiga on drug program for affordability .   Bg 89 98  in past   no hypoglycemia but   Backordered  10 mg so used 12.5  and had vomiting ocass. And indigestion and now on 10 mg  doing better  no bleeding but had  arm ecchymosis after blood thinner at hosp  ROS: See pertinent positives and negatives per HPI. R ear ? Fluid again  popping hx of tubes  has hearing aids  , left ear pinna had lesion removed still red but getting better  has to sleep on ear hole pillow   Past Medical History:  Diagnosis Date   Acute on chronic respiratory failure with hypoxia and hypercapnia (HCC) 11/05/2018   Allergy    ASCUS on Pap smear    had repeat x 2 normal with Dr. Senaida Ores 2011   Coronary artery disease    Gout    Hyperlipidemia    Hypertension    Myocardial infarction [facility])    2002 95% proximal LAD stenosis with thrombus followed by 90% stenosis, the first diagonal 80% stenosis, circumflex 30% stenosis, circumflex obtuse marginal subbranch 70% stenosis, PDA 40% stenosis. She had a drug-eluting stent placement with a Pixel stent   Osteopenia    Solitary kidney     Family History  Problem Relation Age of Onset   Breast cancer Mother 57   Lung cancer Father    Heart attack Brother        Died age 28 MI   Colon cancer Brother    Heart disease Daughter 22       smoker and overweight    Esophageal cancer Neg Hx    Pancreatic cancer Neg Hx    Rectal cancer Neg Hx    Stomach cancer Neg Hx     Social History   Socioeconomic History   Marital status: Single    Spouse  name: Not on file   Number of children: 2   Years of education: 14   Highest education level: Associate degree: occupational, Scientist, product/process development, or vocational program  Occupational History   Occupation: semi retired    Associate Professor: Scientist, research (life sciences)  Tobacco Use   Smoking status: Former    Packs/day: 1.00    Years: 39.00    Additional pack years: 0.00    Total pack years: 39.00    Types: Cigarettes    Quit date: 11/04/2018    Years since quitting: 4.0   Smokeless tobacco: Never  Vaping Use   Vaping Use: Never used  Substance and Sexual Activity   Alcohol use: Yes    Comment: 1-2 per month   Drug use: No   Sexual activity: Not on file  Other Topics Concern   Not on file  Social History Narrative   Lives alone   Left Handed   Drinks 1-2 cups caffeine daily      Enjoys company of 2 cats, watching TV and movies, read on her Ipad, social media   Social  Determinants of Health   Financial Resource Strain: Low Risk  (11/26/2022)   Overall Financial Resource Strain (CARDIA)    Difficulty of Paying Living Expenses: Not hard at all  Food Insecurity: No Food Insecurity (11/26/2022)   Hunger Vital Sign    Worried About Running Out of Food in the Last Year: Never true    Ran Out of Food in the Last Year: Never true  Transportation Needs: No Transportation Needs (11/26/2022)   PRAPARE - Administrator, Civil Service (Medical): No    Lack of Transportation (Non-Medical): No  Physical Activity: Insufficiently Active (11/26/2022)   Exercise Vital Sign    Days of Exercise per Week: 4 days    Minutes of Exercise per Session: 30 min  Stress: No Stress Concern Present (11/26/2022)   [employer] of Occupational Health - Occupational Stress Questionnaire    Feeling of Stress : Not at all  Social Connections: Unknown (11/26/2022)   Social Connection and Isolation Panel [NHANES]    Frequency of Communication with Friends and Family: More than three times a week    Frequency of Social Gatherings with  Friends and Family: More than three times a week    Attends Religious Services: Patient declined    Database administrator or Organizations: Yes    Attends Banker Meetings: 1 to 4 times per year    Marital Status: Divorced    Outpatient Medications Prior to Visit  Medication Sig Dispense Refill   ACCU-CHEK GUIDE test strip USE AS DIRECTED TWICE A DAY 100 strip 3   Accu-Chek Softclix Lancets lancets Use as instructed twice a day Diagnosis E11.9 100 each 3   acetaminophen (TYLENOL) 500 MG tablet Take 1,000 mg by mouth every 6 (six) hours as needed for mild pain.     albuterol (PROVENTIL) (2.5 MG/3ML) 0.083% nebulizer solution Take 3 mLs (2.5 mg total) by nebulization every 6 (six) hours as needed for wheezing or shortness of breath. 300 mL 11   albuterol (VENTOLIN HFA) 108 (90 Base) MCG/ACT inhaler USE 2 PUFFS EVERY 6 HOURS AS NEEDED FOR WHEEZE OR SHORTNESS OF BREATH 8.5 each 5   allopurinol (ZYLOPRIM) 100 MG tablet TAKE 1 TABLET BY MOUTH TWICE A DAY 180 tablet 1   arformoterol (BROVANA) 15 MCG/2ML NEBU Take 2 mLs (15 mcg total) by nebulization 2 (two) times daily. 120 mL 11   aspirin 81 MG tablet Take 81 mg by mouth daily.     Blood Glucose Monitoring Suppl (ACCU-CHEK GUIDE) w/Device KIT Use as directed twice a day Diagnosis E11.9 1 kit 2   budesonide (PULMICORT) 0.5 MG/2ML nebulizer solution Take 2 mLs (0.5 mg total) by nebulization 2 (two) times daily. 120 mL 12   calcium carbonate (TUMS EX) 750 MG chewable tablet Chew 750 mg by mouth daily as needed for heartburn.     furosemide (LASIX) 40 MG tablet TAKE 1 TABLET BY MOUTH EVERY DAY 90 tablet 0   guaiFENesin (MUCINEX) 600 MG 12 hr tablet Take 2 tablets (1,200 mg total) by mouth 2 (two) times daily. 60 tablet 0   KLOR-CON M10 10 MEQ tablet TAKE 1 TABLET BY MOUTH TWICE A DAY 180 tablet 0   metFORMIN (GLUCOPHAGE) 500 MG tablet TAKE 2 TABLETS (1,000 MG TOTAL) BY MOUTH IN THE MORNING AND AT BEDTIME. 360 tablet 1   metoprolol  tartrate (LOPRESSOR) 25 MG tablet TAKE 1/2 TABLETS BY MOUTH 2 TIMES DAILY 90 tablet 0   nitroGLYCERIN (NITROSTAT) 0.4 MG SL  tablet PLACE 1 TABLET UNDER THE TONGUE EVERY 5 MINUTES AS NEEDED. (Patient taking differently: Place 0.4 mg under the tongue every 5 (five) minutes as needed for chest pain.) 25 tablet 1   Polyethylene Glycol 400 (VISINE DRY EYE RELIEF OP) Place 1 drop into both eyes daily as needed (dry eyes).     revefenacin (YUPELRI) 175 MCG/3ML nebulizer solution Take 3 mLs (175 mcg total) by nebulization daily. 90 mL 6   rOPINIRole (REQUIP) 0.5 MG tablet TAKE 1 TABLET AT BEDTIME FOR RESTLESS LEG 90 tablet 0   rosuvastatin (CRESTOR) 20 MG tablet Take 1 tablet (20 mg total) by mouth daily. (Patient taking differently: Take 20 mg by mouth at bedtime.) 90 tablet 3   tirzepatide (MOUNJARO) 10 MG/0.5ML Pen Inject 10 mg into the skin once a week. 2 mL 2   dapagliflozin propanediol (FARXIGA) 10 MG TABS tablet Take 1 tablet (10 mg total) by mouth daily. 30 tablet 3   No facility-administered medications prior to visit.     EXAM:  BP 120/70   Pulse 90   Temp (!) 97.5 F (36.4 C) (Oral)   Ht 5\' 3"  (1.6 m)   Wt 198 lb 6.4 oz (90 kg)   SpO2 90%   BMI 35.14 kg/m   Body mass index is 35.14 kg/m. Wt Readings from Last 3 Encounters:  11/27/22 198 lb 6.4 oz (90 kg)  10/01/22 210 lb 15.7 oz (95.7 kg)  09/24/22 211 lb 3.2 oz (95.8 kg)    GENERAL: vitals reviewed and listed above, alert, oriented, appears well hydrated and in no acute distress HEENT: atraumatic, conjunctiva  clear, no obvious abnormalities on inspection of external nose and ears  r eac 1+ wax shiny tm light reflex slightly amber   no bulging left pinna healing scab with redness no fluctuance NECK: no obvious masses on inspection palpation  LUNGS: clear to auscultation bilaterally, no wheezes, rales or rhonchidec air movement  CV: HRRR, no clubbing cyanosisnl cap refill  MS: moves all extremities without noticeable focal   abnormality feet no ulcers lesions  old pigment from prev injury right  PSYCH: pleasant and cooperative, no obvious depression or anxiety Lab Results  Component Value Date   WBC 10.0 11/27/2022   HGB 13.2 11/27/2022   HCT 40.9 11/27/2022   PLT 305.0 11/27/2022   GLUCOSE 88 11/27/2022   CHOL 124 11/27/2022   TRIG 175.0 (H) 11/27/2022   HDL 36.90 (L) 11/27/2022   LDLDIRECT 116.0 02/05/2019   LDLCALC 53 11/27/2022   ALT 13 11/27/2022   AST 17 11/27/2022   NA 139 11/27/2022   K 4.1 11/27/2022   CL 98 11/27/2022   CREATININE 1.48 (H) 11/27/2022   BUN 30 (H) 11/27/2022   CO2 30 11/27/2022   TSH 3.22 11/27/2022   INR 1.0 11/28/2020   HGBA1C 5.9 11/27/2022   MICROALBUR 29.8 (H) 11/27/2022   BP Readings from Last 3 Encounters:  11/27/22 120/70  09/05/22 114/62  07/24/22 (!) 102/56    ASSESSMENT AND PLAN:  Discussed the following assessment and plan:  Type 2 diabetes mellitus with hyperglycemia, without long-term current use of insulin (HCC) - Plan: Basic metabolic panel, CBC with Differential/Platelet, Hemoglobin A1c, Hepatic function panel, Lipid panel, TSH, Microalbumin / creatinine urine ratio  Essential hypertension - Plan: Basic metabolic panel, CBC with Differential/Platelet, Hemoglobin A1c, Hepatic function panel, Lipid panel, TSH  Chronic obstructive pulmonary disease, unspecified COPD type (HCC) - Plan: Basic metabolic panel, CBC with Differential/Platelet, Hemoglobin A1c, Hepatic function panel,  Lipid panel, TSH  Medication management - Plan: Basic metabolic panel, CBC with Differential/Platelet, Hemoglobin A1c, Hepatic function panel, Lipid panel, TSH  Oxygen dependent - Plan: Basic metabolic panel, CBC with Differential/Platelet, Hemoglobin A1c, Hepatic function panel, Lipid panel, TSH  Hyperlipidemia, unspecified hyperlipidemia type - Plan: Basic metabolic panel, CBC with Differential/Platelet, Hemoglobin A1c, Hepatic function panel, Lipid panel, TSH  Chronic  diastolic congestive heart failure (HCC)  Stable ,  sig weight loss on mounjaro se on higher dose  Check A1c today  Consider dec  metformin  if appropriate Glad she is doing better   Disc ear situation no urgen condition -Patient advised to return or notify health care team  if  new concerns arise.  Patient Instructions  Good to see  you today . Will refill farxiga and see if need to reach out to pharmacy help.  Stay on 10 of mounjaro and will see if we want  to decrease the metformin  Let us know if any  low blood sugars.  Fasting labs today .  Plan fu depending.  6 mos  cpe infall .       Neta Mends. Maveric Debono M.D.

## 2022-11-27 NOTE — Patient Instructions (Signed)
Good to see  you today . Will refill farxiga and see if need to reach out to pharmacy help.  Stay on 10 of mounjaro and will see if we want  to decrease the metformin  Let us know if any  low blood sugars.  Fasting labs today .  Plan fu depending.  6 mos  cpe infall .

## 2022-11-27 NOTE — Progress Notes (Signed)
A1c is down to 5.9!   Ok to decrease the  metformin to 1000 mg per day  . If patient agrees please update the med list.    Ldl at goal  Creatinine up some from last check  could be hydration issue .  No excess protein in urine  Will repeat at next visit 4-6 months

## 2022-12-16 ENCOUNTER — Other Ambulatory Visit (HOSPITAL_COMMUNITY): Payer: Self-pay

## 2022-12-16 ENCOUNTER — Other Ambulatory Visit: Payer: Self-pay

## 2022-12-17 ENCOUNTER — Telehealth: Payer: Self-pay

## 2022-12-17 NOTE — Progress Notes (Unsigned)
   12/17/2022  Patient ID: Rebecca Mcintyre, female   DOB: 02/07/1946, 77 y.o.   MRN: [id number]  Returning patient call to office yesterday requesting to speak with clinical pharmacist.  -Patient endorses being enrolled in [employer] for PAP for [name] and [name] -[name] is on her last refill of Breztri and states a refill needs to be sent to the program -Pending order for Dr. Fabian Sharp to sign for Breztri 2 puffs BID to go to [facility] for PAP processing/fulfillment  -Also coordinating with Medication assistance team to make sure patient is on PAP spreadsheet, so we can assist with program re-enrollments when the time comes  Lenna Gilford, PharmD, DPLA

## 2022-12-18 ENCOUNTER — Other Ambulatory Visit: Payer: Self-pay | Admitting: Internal Medicine

## 2022-12-18 ENCOUNTER — Telehealth: Payer: Self-pay

## 2022-12-18 MED ORDER — BREZTRI AEROSPHERE 160-9-4.8 MCG/ACT IN AERO: 2.0000 | INHALATION_SPRAY | Freq: Two times a day (BID) | RESPIRATORY_TRACT | 3 refills | Status: DC

## 2022-12-18 NOTE — Telephone Encounter (Signed)
-----   Message from Lenna Gilford sent at 12/17/2022  2:47 PM EDT ----- Peri Jefferson afternoon!  I talked to Ms. Crepeau today and she stated Breztri PAP was needing refills sent, so I have pended these for the provider to sign.  I went to see if this and her [name] were on the PAP spreadsheets, and [name] is but not Clinical cytogeneticist.  Now I wonder if she has only been approved for [place]; but since they are the same company, would they go ahead and fill Breztri once prescription sent?  This will also need to be added to the spreadsheet if/when approved.  Thank you!  Lenna Gilford, PharmD, DPLA

## 2022-12-18 NOTE — Telephone Encounter (Signed)
I have signed the order for [facility]. Thanks for helping

## 2022-12-19 MED ORDER — METOPROLOL TARTRATE 25 MG PO TABS: 12.5000 mg | ORAL_TABLET | Freq: Two times a day (BID) | ORAL | 1 refills | Status: DC

## 2022-12-24 NOTE — Telephone Encounter (Signed)
PAP application for FARXIGA FROM [employer] has been mailed to pt home.   I will fax PCP pages once I receive pt pages    PLEASE BE ADVISED  Melanee Spry CPhT [facility] [phone]

## 2022-12-25 NOTE — Progress Notes (Unsigned)
Cardiology Office Note:   Date:  12/26/2022  ID:  Rebecca Mcintyre, DOB November 29, 1945, MRN [id number] PCP: Rebecca Headings, MD  [facility] Providers Cardiologist:  Rollene Rotunda, MD {  History of Present Illness:   Rebecca Mcintyre is a 77 y.o. female  who presents for evaluation of CAD. I saw her in 2014.  She has a history of an anterior MI in 2002.   She had DES to the LAD.    She was in the hospital after syncopal episode in June 2020.    She had acute hypoxic respiratory failure.  She had acute on chronic diastolic dysfunction.  She had acute cor pulmonale.  She did require some diuresis.  She had an echocardiogram which demonstrated well-preserved left ventricular function with some suggestion of diastolic dysfunction.  There was elevated pulmonary pressures noted.  She had moderate RV dysfunction.  She did have diuresis of 10.2 L.  She was sent home on 40 mg of Lasix.  She was also sent home on home O2 and pulmonary meds as listed.  She did have a follow-up echo.  Her pulmonary pressures seem to be reduced and RV function improved.  In July 2022 she was hospitalized with acute on chronic respiratory failure with acute on chronic diastolic HF.  She had acute encephalopathy thought to be related to narcotics.  During that hospitalization she had heart failure with preserved ejection fraction require diuresis.  She had an elevated troponin which was felt to be demand ischemia.  Echo demonstrated no new wall motion abnormalities.    She returns for follow up.  She has lost about 35 lbs.  The patient denies any new symptoms such as chest discomfort, neck or arm discomfort. There has been no new shortness of breath, PND or orthopnea. There have been no reported palpitations, presyncope or syncope.   She is on chronic O2 and she is finished pulmonary rehab.  She thinks she is breathing better with her weight loss.  She is not having as much swelling in fact she is reduced her as needed Lasix.  She is not  having any chest pressure, neck or arm discomfort.   ROS: As stated in the HPI and negative for all other systems.  Studies Reviewed:    EKG:   NA  Risk Assessment/Calculations:              Physical Exam:   VS:  BP 118/62 (BP Location: Left Arm, Patient Position: Sitting, Cuff Size: Normal)   Pulse 89   Ht 5\' 3"  (1.6 m)   Wt 198 lb 3.2 oz (89.9 kg)   SpO2 93%   BMI 35.11 kg/m    Wt Readings from Last 3 Encounters:  12/26/22 198 lb 3.2 oz (89.9 kg)  11/27/22 198 lb 6.4 oz (90 kg)  10/01/22 210 lb 15.7 oz (95.7 kg)     GEN: Well nourished, well developed in no acute distress NECK: No JVD; No carotid bruits CARDIAC: RRR, no murmurs, rubs, gallops RESPIRATORY:  Clear to auscultation without rales, wheezing or rhonchi  ABDOMEN: Soft, non-tender, non-distended EXTREMITIES:  No edema; No deformity   ASSESSMENT AND PLAN:   CAD:   The patient has no new sypmtoms.  No further cardiovascular testing is indicated.  We will continue with aggressive risk reduction and meds as listed.   DYSLIPIDEMIA:     LDL was 53.  No change in therapy.  HTN:  The blood pressure is at target.  No  change in therapy.   DIABETES:  Her hemoglobin A1c was 5.9 down from  6.4.  No change in therapy.     CHRONIC DIASTOLIC DYSFUNCTION:   Her creat was mildly elevated.  We talked about reducing her Lasix which she is currently only taking twice weekly or so.    She seems to be euvolemic.       Follow up with me in six months.    Signed, Rollene Rotunda, MD

## 2022-12-26 ENCOUNTER — Ambulatory Visit (HOSPITAL_BASED_OUTPATIENT_CLINIC_OR_DEPARTMENT_OTHER): Payer: Medicare Other | Admitting: Pulmonary Disease

## 2022-12-26 ENCOUNTER — Ambulatory Visit: Payer: Medicare Other | Attending: Cardiology | Admitting: Cardiology

## 2022-12-26 ENCOUNTER — Encounter (HOSPITAL_BASED_OUTPATIENT_CLINIC_OR_DEPARTMENT_OTHER): Payer: Self-pay | Admitting: Pulmonary Disease

## 2022-12-26 ENCOUNTER — Encounter: Payer: Self-pay | Admitting: Cardiology

## 2022-12-26 VITALS — BP 130/78 | HR 91 | Ht 63.0 in | Wt 197.8 lb

## 2022-12-26 VITALS — BP 118/62 | HR 89 | Ht 63.0 in | Wt 198.2 lb

## 2022-12-26 DIAGNOSIS — J9611 Chronic respiratory failure with hypoxia: Secondary | ICD-10-CM | POA: Diagnosis not present

## 2022-12-26 DIAGNOSIS — I10 Essential (primary) hypertension: Secondary | ICD-10-CM

## 2022-12-26 DIAGNOSIS — I2510 Atherosclerotic heart disease of native coronary artery without angina pectoris: Secondary | ICD-10-CM | POA: Diagnosis not present

## 2022-12-26 DIAGNOSIS — E785 Hyperlipidemia, unspecified: Secondary | ICD-10-CM | POA: Diagnosis not present

## 2022-12-26 DIAGNOSIS — I5032 Chronic diastolic (congestive) heart failure: Secondary | ICD-10-CM | POA: Diagnosis not present

## 2022-12-26 DIAGNOSIS — J432 Centrilobular emphysema: Secondary | ICD-10-CM

## 2022-12-26 NOTE — Progress Notes (Signed)
   Subjective:    Patient ID: Rebecca Mcintyre, female    DOB: February 15, 1946, 77 y.o.   MRN: [id number]  HPI  77 yo ex-smoker for follow-up of COPD , chronic hypoxic /hypercarbic respiratory failure and pulmonary hypertension -on bipap-ST, POC Identical twin of Rebecca Mcintyre ,my patient who passed away -not a candidate for [place] valve [facility] )   PMH - CAD, diastolic HF,  dyslipidemia, obesity  - hospitalized 10/2018 for acute on chronic hypercarbic respiratory failure -slow to resolve COPD exacerbation 01/2020 - hospitalized 11/2020  for right lower extremity injury, course complicated by acute diastolic heart failure and encephalopathy due to narcotics elevated troponin  -hosp 04/2022 for flare   Chief Complaint  Patient presents with   Follow-up    Pt states she has been doing on bipap. Pt states her sob is better due to weight loss.    Arrives on oxygen, accompanied by her daughter Rebecca Mcintyre.  She has completed pulmonary rehab program and this was helpful.  She was using 6 L continuous oxygen and previously not felt to be a candidate for POC, she would like to try again. Greggory Keen has helped and she has lost 40 pounds since initiation she feels like her breathing is improved. She is maintained on NIV during sleep and denies problems with mask or pressure Current regimen is budesonide/Brovana with DuoNebs.  She was unable to afford Yupelri.  She did not qualify for [facility] assistance  On ambulation today she is able to maintain saturation with 5 L pulse POC   Significant tests/ events reviewed   ABG 05/2022 7.36/49/69/93% ABG 01/2022 7.4 7/57/65 on 3 L oxygen ABG 02/2020 7.4 2/44/53/87%     PFTs 05/2022 FEV1 0.73 /37%-improved to 0.89 postbronchodilator , DLCO 3.43   PFTs 12/29/2018 FVC 2.31 (82), FEV1 1.20 (56), ratio 52, no bronchodilatory response, DLCO 51 %   10/2018 CTA Chest- No PE; cardiomegaly, emphysema       NPSG 12/13/18  no obstructive sleep apnea, min O2 89%. Periodic limb  movements did occur during sleep  Review of Systems neg for any significant sore throat, dysphagia, itching, sneezing, nasal congestion or excess/ purulent secretions, fever, chills, sweats, unintended wt loss, pleuritic or exertional cp, hempoptysis, orthopnea pnd or change in chronic leg swelling. Also denies presyncope, palpitations, heartburn, abdominal pain, nausea, vomiting, diarrhea or change in bowel or urinary habits, dysuria,hematuria, rash, arthralgias, visual complaints, headache, numbness weakness or ataxia.     Objective:   Physical Exam  Gen. Pleasant, obese, in no distress ENT - no lesions, no post nasal drip Neck: No JVD, no thyromegaly, no carotid bruits Lungs: no use of accessory muscles, no dullness to percussion, decreased without rales or rhonchi  Cardiovascular: Rhythm regular, heart sounds  normal, no murmurs or gallops, no peripheral edema Musculoskeletal: No deformities, no cyanosis or clubbing , no tremors        Assessment & Plan:

## 2022-12-26 NOTE — Patient Instructions (Signed)
  Follow-Up: At [facility], you and your health needs are our priority.  As part of our continuing mission to provide you with exceptional heart care, we have created designated Provider Care Teams.  These Care Teams include your primary Cardiologist (physician) and Advanced Practice Providers (APPs -  Physician Assistants and Nurse Practitioners) who all work together to provide you with the care you need, when you need it.  We recommend signing up for the patient portal called "MyChart".  Sign up information is provided on this After Visit Summary.  MyChart is used to connect with patients for Virtual Visits (Telemedicine).  Patients are able to view lab/test results, encounter notes, upcoming appointments, etc.  Non-urgent messages can be sent to your provider as well.   To learn more about what you can do with MyChart, go to [email or web address].    Your next appointment:   6 month(s)  Provider:   James Hochrein, MD      

## 2022-12-26 NOTE — Patient Instructions (Addendum)
X refill Albuterol MDI & requip  X [employer] to assess for POC

## 2022-12-26 NOTE — Assessment & Plan Note (Addendum)
She was able to maintain saturation today with 5 L pulse POC.  She previously required 4 to 8 L continuous oxygen especially during pulmonary rehab.  Switching to POC would greatly help her quality of life and hence I would be okay to proceed with her getting 5 L pulse POC.  While at home she will continue to use continuous oxygen She is maintained on her Olene Craven, IVF settings targeting on minimal ventilation of 5.2 L/min.  Compliance has been poor in July but good previously, average pressure is 10/4 cm.  I encouraged her to be more compliant

## 2022-12-26 NOTE — Assessment & Plan Note (Signed)
Continue current regimen of budesonide/Brovana and DuoNebs for rescue

## 2022-12-27 DIAGNOSIS — J9611 Chronic respiratory failure with hypoxia: Secondary | ICD-10-CM | POA: Diagnosis not present

## 2022-12-30 ENCOUNTER — Encounter (HOSPITAL_BASED_OUTPATIENT_CLINIC_OR_DEPARTMENT_OTHER): Payer: Self-pay | Admitting: Pulmonary Disease

## 2023-01-01 MED ORDER — ROPINIROLE HCL 0.5 MG PO TABS: ORAL_TABLET | ORAL | 0 refills | Status: DC

## 2023-01-01 MED ORDER — ALBUTEROL SULFATE HFA 108 (90 BASE) MCG/ACT IN AERS: 2.0000 | INHALATION_SPRAY | Freq: Four times a day (QID) | RESPIRATORY_TRACT | 5 refills | Status: DC | PRN

## 2023-01-03 ENCOUNTER — Telehealth: Payer: Self-pay | Admitting: *Deleted

## 2023-01-03 NOTE — Telephone Encounter (Signed)
Contacted regarding PREP Class referral. Left voice message to return my call for more information.

## 2023-01-05 ENCOUNTER — Other Ambulatory Visit: Payer: Self-pay | Admitting: Family

## 2023-01-06 DIAGNOSIS — I509 Heart failure, unspecified: Secondary | ICD-10-CM | POA: Diagnosis not present

## 2023-01-06 DIAGNOSIS — J9612 Chronic respiratory failure with hypercapnia: Secondary | ICD-10-CM | POA: Diagnosis not present

## 2023-01-06 DIAGNOSIS — J9611 Chronic respiratory failure with hypoxia: Secondary | ICD-10-CM | POA: Diagnosis not present

## 2023-01-06 DIAGNOSIS — G4733 Obstructive sleep apnea (adult) (pediatric): Secondary | ICD-10-CM | POA: Diagnosis not present

## 2023-01-06 DIAGNOSIS — J449 Chronic obstructive pulmonary disease, unspecified: Secondary | ICD-10-CM | POA: Diagnosis not present

## 2023-01-11 ENCOUNTER — Other Ambulatory Visit (HOSPITAL_COMMUNITY): Payer: Self-pay

## 2023-01-13 ENCOUNTER — Other Ambulatory Visit: Payer: Self-pay

## 2023-01-13 ENCOUNTER — Encounter: Payer: Self-pay | Admitting: Pharmacist

## 2023-01-13 ENCOUNTER — Other Ambulatory Visit (HOSPITAL_COMMUNITY): Payer: Self-pay

## 2023-01-14 ENCOUNTER — Encounter (HOSPITAL_BASED_OUTPATIENT_CLINIC_OR_DEPARTMENT_OTHER): Payer: Self-pay | Admitting: Pulmonary Disease

## 2023-01-14 ENCOUNTER — Other Ambulatory Visit (HOSPITAL_BASED_OUTPATIENT_CLINIC_OR_DEPARTMENT_OTHER): Payer: Self-pay

## 2023-01-14 ENCOUNTER — Telehealth (HOSPITAL_BASED_OUTPATIENT_CLINIC_OR_DEPARTMENT_OTHER): Payer: Self-pay

## 2023-01-14 MED ORDER — AZITHROMYCIN 250 MG PO TABS: 250.0000 mg | ORAL_TABLET | ORAL | 0 refills | Status: DC

## 2023-01-14 MED ORDER — PREDNISONE 10 MG PO TABS: ORAL_TABLET | ORAL | 0 refills | Status: DC

## 2023-01-14 NOTE — Telephone Encounter (Signed)
Please see telephone encounter

## 2023-01-14 NOTE — Telephone Encounter (Signed)
Per my chart message Called and spoke w/ pt's daughter she stated  that her mother has been coughing light brown mucus  and  running a low grade fever , also  stated she is having some nasal congestion that is clear. She has some nose bleed from the oxygen being turn up. This all started last week.

## 2023-01-14 NOTE — Telephone Encounter (Signed)
Patient's daughter is calling stating that her mother has fluid in her cough. Please call and advise.[phone]

## 2023-01-14 NOTE — Telephone Encounter (Signed)
Called and spoke with patient's daughter regarded message , advised her on how to take prednisone  dosage  and zpack .also to advised to get her mother tested for Covid. Sent in  medications into the [facility] per patient request. Pt 's daughter stated that she understood how to give her mother medications.

## 2023-02-06 ENCOUNTER — Ambulatory Visit (HOSPITAL_BASED_OUTPATIENT_CLINIC_OR_DEPARTMENT_OTHER): Payer: Medicare Other | Admitting: Pulmonary Disease

## 2023-02-06 ENCOUNTER — Ambulatory Visit (HOSPITAL_BASED_OUTPATIENT_CLINIC_OR_DEPARTMENT_OTHER): Payer: Medicare Other

## 2023-02-06 ENCOUNTER — Encounter (HOSPITAL_BASED_OUTPATIENT_CLINIC_OR_DEPARTMENT_OTHER): Payer: Self-pay | Admitting: Pulmonary Disease

## 2023-02-06 VITALS — BP 144/62 | HR 94 | Resp 22 | Ht 63.0 in | Wt 190.8 lb

## 2023-02-06 DIAGNOSIS — J441 Chronic obstructive pulmonary disease with (acute) exacerbation: Secondary | ICD-10-CM | POA: Diagnosis not present

## 2023-02-06 DIAGNOSIS — J9611 Chronic respiratory failure with hypoxia: Secondary | ICD-10-CM

## 2023-02-06 DIAGNOSIS — R918 Other nonspecific abnormal finding of lung field: Secondary | ICD-10-CM | POA: Diagnosis not present

## 2023-02-06 DIAGNOSIS — Z23 Encounter for immunization: Secondary | ICD-10-CM

## 2023-02-06 DIAGNOSIS — J439 Emphysema, unspecified: Secondary | ICD-10-CM | POA: Diagnosis not present

## 2023-02-06 DIAGNOSIS — J432 Centrilobular emphysema: Secondary | ICD-10-CM | POA: Diagnosis not present

## 2023-02-06 MED ORDER — AMOXICILLIN-POT CLAVULANATE 875-125 MG PO TABS: 1.0000 | ORAL_TABLET | Freq: Two times a day (BID) | ORAL | 0 refills | Status: DC

## 2023-02-06 MED ORDER — POTASSIUM CHLORIDE CRYS ER 10 MEQ PO TBCR: 10.0000 meq | EXTENDED_RELEASE_TABLET | Freq: Two times a day (BID) | ORAL | 0 refills | Status: DC

## 2023-02-06 NOTE — Patient Instructions (Signed)
X CXR today  X Blood work - CBC, CMET

## 2023-02-06 NOTE — Assessment & Plan Note (Signed)
For now I have asked her to hold off on POC and use continuous oxygen.  I have advised her that although [employer] was up to 6 L POC, her oxygen requirements seem to fluctuate and POC would be suboptimal

## 2023-02-06 NOTE — Progress Notes (Signed)
   Subjective:    Patient ID: Rebecca Mcintyre, female    DOB: March 24, 1946, 77 y.o.   MRN: [id number]  HPI  77 yo ex-smoker for follow-up of COPD , chronic hypoxic /hypercarbic respiratory failure and pulmonary hypertension -on astral-avaps, POC Identical twin of Stevana Jerge ,my patient who passed away -not a candidate for Zephyr valve [facility] )   PMH - CAD, diastolic HF,  dyslipidemia, obesity  - hospitalized 10/2018 for acute on chronic hypercarbic respiratory failure -slow to resolve COPD exacerbation 01/2020 - hospitalized 11/2020  for right lower extremity injury, course complicated by acute diastolic heart failure and encephalopathy due to narcotics elevated troponin  -hosp 04/2022 for flare  12/26/22 OV On ambulation she is able to maintain saturation with 5 L pulse POC   6-week follow-up visit. Arrives on 5 L POC with her daughter, saturation was 81%. Improved to 90% on continuous 3 L oxygen She went to the beach, she called [place] and on 8/20 we initiated Z-Pak and prednisone taper.  She has coughing episodes since then.  On 1 time she coughed up what looked like food particle?  Bean. She has been using flutter valve and Mucinex. She took Lasix last few days, weight has dropped from 197 to 190 pounds but she is still feeling fatigued  We obtain chest x-ray today which shows bibasilar infiltrates   Significant tests/ events reviewed   ABG 05/2022 7.36/49/69/93% ABG 01/2022 7.4 7/57/65 on 3 L oxygen ABG 02/2020 7.4 2/44/53/87%     PFTs 05/2022 FEV1 0.73 /37%-improved to 0.89 postbronchodilator , DLCO 3.43   PFTs 12/29/2018 FVC 2.31 (82), FEV1 1.20 (56), ratio 52, no bronchodilatory response, DLCO 51 %   10/2018 CTA Chest- No PE; cardiomegaly, emphysema       NPSG 12/13/18  no obstructive sleep apnea, min O2 89%. Periodic limb movements did occur during sleep  Review of Systems neg for any significant sore throat, dysphagia, itching, sneezing, nasal congestion or excess/ purulent  secretions, fever, chills, sweats, unintended wt loss, pleuritic or exertional cp, hempoptysis, orthopnea pnd or change in chronic leg swelling. Also denies presyncope, palpitations, heartburn, abdominal pain, nausea, vomiting, diarrhea or change in bowel or urinary habits, dysuria,hematuria, rash, arthralgias, visual complaints, headache, numbness weakness or ataxia.     Objective:   Physical Exam  Gen. Pleasant, obese, in no distress ENT - no lesions, no post nasal drip Neck: No JVD, no thyromegaly, no carotid bruits Lungs: no use of accessory muscles, no dullness to percussion, decreased bilateral without rales or rhonchi  Cardiovascular: Rhythm regular, heart sounds  normal, no murmurs or gallops, no peripheral edema Musculoskeletal: No deformities, no cyanosis or clubbing , no tremors       Assessment & Plan:   She seems to be more hypoxic than baseline.  About 6 weeks ago she was able to maintain saturation on 5 L POC but today her saturation is only 81% and she seems to require continuous oxygen.  Chest x-ray shows bibasilar pneumonia and I will treat her for community-acquired pneumonia with Augmentin for 7 days.  Will also obtain labs to see if she is hypokalemic.  Weight has dropped from 197 to 190 pounds with Lasix but there does not seem to be any evidence of overt heart failure or cor pulmonale

## 2023-02-06 NOTE — Assessment & Plan Note (Signed)
Continue budesonide Brovana combination with use of DuoNebs for rescue. Continue Mucinex and flutter valve Flu shot given today

## 2023-02-07 ENCOUNTER — Encounter (HOSPITAL_COMMUNITY): Payer: Self-pay

## 2023-02-07 ENCOUNTER — Other Ambulatory Visit (HOSPITAL_COMMUNITY): Payer: Self-pay

## 2023-02-07 LAB — CBC WITH DIFFERENTIAL/PLATELET
Basophils Absolute: 0.1 10*3/uL (ref 0.0–0.2)
Basos: 1 %
EOS (ABSOLUTE): 0.4 10*3/uL (ref 0.0–0.4)
Eos: 4 %
Hematocrit: 38.3 % (ref 34.0–46.6)
Hemoglobin: 11.9 g/dL (ref 11.1–15.9)
Immature Grans (Abs): 0.1 10*3/uL (ref 0.0–0.1)
Immature Granulocytes: 1 %
Lymphocytes Absolute: 2.3 10*3/uL (ref 0.7–3.1)
Lymphs: 23 %
MCH: 28.3 pg (ref 26.6–33.0)
MCHC: 31.1 g/dL — ABNORMAL LOW (ref 31.5–35.7)
MCV: 91 fL (ref 79–97)
Monocytes Absolute: 0.7 10*3/uL (ref 0.1–0.9)
Monocytes: 7 %
Neutrophils Absolute: 6.4 10*3/uL (ref 1.4–7.0)
Neutrophils: 64 %
Platelets: 367 10*3/uL (ref 150–450)
RBC: 4.21 x10E6/uL (ref 3.77–5.28)
RDW: 13.3 % (ref 11.7–15.4)
WBC: 10 10*3/uL (ref 3.4–10.8)

## 2023-02-07 LAB — COMPREHENSIVE METABOLIC PANEL
ALT: 19 IU/L (ref 0–32)
AST: 23 IU/L (ref 0–40)
Albumin: 4 g/dL (ref 3.8–4.8)
Alkaline Phosphatase: 120 IU/L (ref 44–121)
BUN/Creatinine Ratio: 19 (ref 12–28)
BUN: 30 mg/dL — ABNORMAL HIGH (ref 8–27)
Bilirubin Total: 0.3 mg/dL (ref 0.0–1.2)
CO2: 26 mmol/L (ref 20–29)
Calcium: 9.5 mg/dL (ref 8.7–10.3)
Chloride: 95 mmol/L — ABNORMAL LOW (ref 96–106)
Creatinine, Ser: 1.61 mg/dL — ABNORMAL HIGH (ref 0.57–1.00)
Globulin, Total: 3.3 g/dL (ref 1.5–4.5)
Glucose: 153 mg/dL — ABNORMAL HIGH (ref 70–99)
Potassium: 3.8 mmol/L (ref 3.5–5.2)
Sodium: 139 mmol/L (ref 134–144)
Total Protein: 7.3 g/dL (ref 6.0–8.5)
eGFR: 33 mL/min/{1.73_m2} — ABNORMAL LOW (ref >59)

## 2023-02-17 ENCOUNTER — Encounter (HOSPITAL_BASED_OUTPATIENT_CLINIC_OR_DEPARTMENT_OTHER): Payer: Self-pay | Admitting: Pulmonary Disease

## 2023-02-17 MED ORDER — DOXYCYCLINE HYCLATE 100 MG PO TABS: 100.0000 mg | ORAL_TABLET | Freq: Two times a day (BID) | ORAL | 0 refills | Status: DC

## 2023-02-25 ENCOUNTER — Encounter (HOSPITAL_BASED_OUTPATIENT_CLINIC_OR_DEPARTMENT_OTHER): Payer: Self-pay | Admitting: Pulmonary Disease

## 2023-02-26 ENCOUNTER — Encounter (HOSPITAL_BASED_OUTPATIENT_CLINIC_OR_DEPARTMENT_OTHER): Payer: Self-pay | Admitting: Pulmonary Disease

## 2023-02-28 ENCOUNTER — Ambulatory Visit: Payer: Medicare Other

## 2023-02-28 ENCOUNTER — Encounter: Payer: Self-pay | Admitting: Internal Medicine

## 2023-02-28 ENCOUNTER — Other Ambulatory Visit: Payer: Self-pay | Admitting: Internal Medicine

## 2023-02-28 ENCOUNTER — Ambulatory Visit: Payer: Medicare Other | Admitting: Internal Medicine

## 2023-02-28 VITALS — BP 119/53 | HR 78 | Ht 63.0 in | Wt 190.0 lb

## 2023-02-28 DIAGNOSIS — R918 Other nonspecific abnormal finding of lung field: Secondary | ICD-10-CM | POA: Diagnosis not present

## 2023-02-28 DIAGNOSIS — J9621 Acute and chronic respiratory failure with hypoxia: Secondary | ICD-10-CM | POA: Diagnosis not present

## 2023-02-28 DIAGNOSIS — I5033 Acute on chronic diastolic (congestive) heart failure: Secondary | ICD-10-CM

## 2023-02-28 DIAGNOSIS — J181 Lobar pneumonia, unspecified organism: Secondary | ICD-10-CM | POA: Diagnosis not present

## 2023-02-28 DIAGNOSIS — I700 Atherosclerosis of aorta: Secondary | ICD-10-CM | POA: Diagnosis not present

## 2023-02-28 MED ORDER — FUROSEMIDE 40 MG PO TABS: 40.0000 mg | ORAL_TABLET | Freq: Every day | ORAL | 1 refills | Status: DC

## 2023-02-28 NOTE — Patient Instructions (Addendum)
Follow up with Dr. Vassie Loll in about 3 months, sooner if needed.    I think your COPD is taking longer to resolve this recent flare. Can take a couple of months! I think you have some extra fluid on so start taking lasix 40 mg once daily, Please get blood work today. I will contact you with results.   Keep oxygen levels over 90%. Try to take it easy and rest.   Let us know if not slowly continuing to improve.   Continue breztri 2puffs twice daily, pulmicort nebs, flutter valve, mucinex, and bipap at night. Keep taking albuterol up to 4 times daily as needed for shortness of breath.   I do not think a 4th course of antibiotics would be helpful at this time.   We discussed if you need to go to the hospital and feel you currently have most of the support you need that they would provide in hospital.

## 2023-02-28 NOTE — Progress Notes (Signed)
Rebecca Mcintyre    [id number]    [birth year]  Primary Care Physician:Panosh, Neta Mends, MD Date of Appointment: 02/28/2023 Established Patient Visit  Chief complaint:   Chief Complaint  Patient presents with   Follow-up    F/u pneumonia, pt states she does not feel as if it is gone.      HPI: Rebecca Mcintyre is a 77 y.o. woman with COPD on home oxygen 3L POC - patient of Dr. Vassie Loll.   Interval Updates: Here for acute visit. Treated last month for CAP with augmentin for 7 days. Prior to this had prednisone and z-pak. She had a third antibiotic after the augmentin - doxycycline - which upset her stomach. Has flutter valve and mucolytics.  Has ongoing symptoms of shortness of breath, wet cough. She is wheezing in the AM. She uses breztri inhaler twice daily, and pulmicort nebulzier morning and night. She is taking albuterol 1-2 times/day. This does seem to help a little bit.  No fevers, +chills.  Oxygen has been increased to 4-5LNC on tank - her POC does not feel like enough.  Her granddaughter is getting married end of October and wants to feel better.  Using bipap at night. Uses flutter valve at night.   She is taking lasix 40 mg once every other day. She has recently lost a lot of water weight. She stopped lasix cold [place]. Now has resumed to 40 mg every other day.    I have reviewed the patient's family social and past medical history and updated as appropriate.   Past Medical History:  Diagnosis Date   Acute on chronic respiratory failure with hypoxia and hypercapnia (HCC) 11/05/2018   Allergy    ASCUS on Pap smear    had repeat x 2 normal with Dr. Senaida Ores 2011   Coronary artery disease    Gout    Hyperlipidemia    Hypertension    Myocardial infarction [facility])    2002 95% proximal LAD stenosis with thrombus followed by 90% stenosis, the first diagonal 80% stenosis, circumflex 30% stenosis, circumflex obtuse marginal subbranch 70% stenosis, PDA 40% stenosis. She had a  drug-eluting stent placement with a Pixel stent   Osteopenia    Solitary kidney     Past Surgical History:  Procedure Laterality Date   CARDIAC CATHETERIZATION     CORONARY ANGIOPLASTY     CORONARY ANGIOPLASTY WITH STENT PLACEMENT  2002   HEMORRHOID SURGERY  2011   LAD Stent angioplasty  2002   Right oophorectomy with salpingectomy     benign growth Dr. Perlie Gold    Family History  Problem Relation Age of Onset   Breast cancer Mother 8   Lung cancer Father    Heart attack Brother        Died age 3 MI   Colon cancer Brother    Heart disease Daughter 79       smoker and overweight    Esophageal cancer Neg Hx    Pancreatic cancer Neg Hx    Rectal cancer Neg Hx    Stomach cancer Neg Hx     Social History   Occupational History   Occupation: semi retired    Associate Professor: Scientist, research (life sciences)  Tobacco Use   Smoking status: Former    Current packs/day: 0.00    Average packs/day: 1 pack/day for 39.0 years (39.0 ttl pk-yrs)    Types: Cigarettes    Start date: 11/04/1979    Quit date: 11/04/2018  Years since quitting: 4.3   Smokeless tobacco: Never  Vaping Use   Vaping status: Never Used  Substance and Sexual Activity   Alcohol use: Yes    Comment: 1-2 per month   Drug use: No   Sexual activity: Not on file     Physical Exam: Blood pressure (!) 119/53, pulse 78, height 5\' 3"  (1.6 m), weight 190 lb (86.2 kg).  Gen:      No acute distress, chronically ill appearing on nasal cannula ENT:  on nasal cannula, no nasal polyps, mucus membranes moist Lungs:   diminished, no wheeze or crackles CV:         Regular rate and rhythm; no murmurs, rubs, or gallops.  1+ pitting edema, chronic venous stasis   Data Reviewed: Imaging: I have personally reviewed the Chest xray from today reviewed - LLL opacity improving, chronic interstitial changes.   PFTs:     Latest Ref Rng & Units 12/29/2018   11:55 AM  PFT Results  FVC-Pre L 2.28  P  FVC-Predicted Pre % 81  P  FVC-Post L 2.31  P   FVC-Predicted Post % 82  P  Pre FEV1/FVC % % 51  P  Post FEV1/FCV % % 52  P  FEV1-Pre L 1.17  P  FEV1-Predicted Pre % 55  P  FEV1-Post L 1.20  P  DLCO uncorrected ml/min/mmHg 9.62  P  DLCO UNC% % 51  P  DLVA Predicted % 67  P  TLC L 5.11  P  TLC % Predicted % 104  P  RV % Predicted % 128  P    P Preliminary result   I have personally reviewed the patient's PFTs and moderately severe airflow limitation  Labs: Lab Results  Component Value Date   NA 139 02/06/2023   K 3.8 02/06/2023   CO2 26 02/06/2023   GLUCOSE 153 (H) 02/06/2023   BUN 30 (H) 02/06/2023   CREATININE 1.61 (H) 02/06/2023   CALCIUM 9.5 02/06/2023   GFR 34.27 (L) 11/27/2022   EGFR 33 (L) 02/06/2023   GFRNONAA 41 (L) 05/30/2022   Lab Results  Component Value Date   WBC 10.0 02/06/2023   HGB 11.9 02/06/2023   HCT 38.3 02/06/2023   MCV 91 02/06/2023   PLT 367 02/06/2023     Immunization status: Immunization History  Administered Date(s) Administered   Fluad Quad(high Dose 65+) 02/11/2019, 02/29/2020, 04/10/2021, 03/21/2022   Influenza Split 04/29/2011, 04/08/2012   Influenza Whole 02/16/2008, 03/15/2009, 03/20/2010   Influenza, High Dose Seasonal PF 04/14/2013, 02/14/2015, 02/28/2016, 03/10/2017, 03/25/2018   Influenza, Seasonal, Injecte, Preservative Fre 02/06/2023   Influenza,inj,Quad PF,6+ Mos 04/18/2014   Influenza,inj,quad, With Preservative 02/25/2019   Influenza-Unspecified 02/24/2017   Moderna Covid-19 Fall Seasonal Vaccine 55yrs & older 05/07/2022   PFIZER(Purple Top)SARS-COV-2 Vaccination 06/15/2019, 07/06/2019, 04/13/2020   Pneumococcal Conjugate-13 04/08/2012   Pneumococcal Polysaccharide-23 04/14/2013   Respiratory Syncytial Virus Vaccine,Recomb Aduvanted(Arexvy) 05/07/2022   Td 09/14/2007   Unspecified SARS-COV-2 Vaccination 04/25/2021   Zoster Recombinant(Shingrix) 06/30/2021, 10/12/2021   Zoster, Live 09/14/2007    External Records Personally Reviewed: pulmonary,  cardiology  Assessment:   Acute on chronic hypoxemic respiratory failure COPD, moderately severe Acute on chronic HFpEF   Plan/Recommendations: Chest xray reviewed - LLL opacity improving, chronic interstitial changes.  I think your COPD is taking longer to resolve this recent flare. Can take a couple of months! I think you have some extra fluid on so start taking lasix 40 mg once daily, Please get blood  work today. I will contact you with results.   Keep oxygen levels over 90%. Try to take it easy and rest.   Let us know if not slowly continuing to improve.   Continue breztri 2puffs twice daily, pulmicort nebs, flutter valve, mucinex, and bipap at night. Keep taking albuterol up to 4 times daily as needed for shortness of breath.   I do not think a 4th course of antibiotics would be helpful at this time.   We discussed possibility of hospitalization - think we can defer at this time. Discussed return to care precautions if not keeping O2>90% on home oxygen. We discussed if you need to go to the hospital and feel you currently have most of the support you need that they would provide in hospital.     Return to Care: Return in about 3 months (around 05/31/2023) for Dr Vassie Loll.Durel Salts, MD Pulmonary and Critical Care Medicine [facility] Office:[phone]

## 2023-03-01 LAB — COMPREHENSIVE METABOLIC PANEL
ALT: 8 [IU]/L (ref 0–32)
AST: 18 [IU]/L (ref 0–40)
Albumin: 4.2 g/dL (ref 3.8–4.8)
Alkaline Phosphatase: 93 [IU]/L (ref 44–121)
BUN/Creatinine Ratio: 18 (ref 12–28)
BUN: 23 mg/dL (ref 8–27)
Bilirubin Total: 0.3 mg/dL (ref 0.0–1.2)
CO2: 23 mmol/L (ref 20–29)
Calcium: 9.7 mg/dL (ref 8.7–10.3)
Chloride: 98 mmol/L (ref 96–106)
Creatinine, Ser: 1.3 mg/dL — ABNORMAL HIGH (ref 0.57–1.00)
Globulin, Total: 2.7 g/dL (ref 1.5–4.5)
Glucose: 120 mg/dL — ABNORMAL HIGH (ref 70–99)
Potassium: 4.5 mmol/L (ref 3.5–5.2)
Sodium: 139 mmol/L (ref 134–144)
Total Protein: 6.9 g/dL (ref 6.0–8.5)
eGFR: 43 mL/min/{1.73_m2} — ABNORMAL LOW (ref >59)

## 2023-03-01 LAB — BRAIN NATRIURETIC PEPTIDE: BNP: 170 pg/mL — ABNORMAL HIGH (ref 0.0–100.0)

## 2023-03-07 ENCOUNTER — Encounter: Payer: Self-pay | Admitting: Internal Medicine

## 2023-03-07 ENCOUNTER — Encounter (HOSPITAL_BASED_OUTPATIENT_CLINIC_OR_DEPARTMENT_OTHER): Payer: Self-pay | Admitting: Pulmonary Disease

## 2023-03-08 NOTE — Telephone Encounter (Signed)
Please see mychart message sent by pt's daughter and advise.

## 2023-03-10 NOTE — Telephone Encounter (Signed)
No result notes entered for recent labs. Thanks!

## 2023-03-18 DIAGNOSIS — Z1231 Encounter for screening mammogram for malignant neoplasm of breast: Secondary | ICD-10-CM | POA: Diagnosis not present

## 2023-03-18 LAB — HM MAMMOGRAPHY

## 2023-03-19 ENCOUNTER — Encounter: Payer: Self-pay | Admitting: Internal Medicine

## 2023-03-20 ENCOUNTER — Other Ambulatory Visit: Payer: Self-pay | Admitting: Family

## 2023-03-20 ENCOUNTER — Other Ambulatory Visit: Payer: Self-pay | Admitting: Cardiology

## 2023-03-20 DIAGNOSIS — E785 Hyperlipidemia, unspecified: Secondary | ICD-10-CM

## 2023-03-20 DIAGNOSIS — Z85828 Personal history of other malignant neoplasm of skin: Secondary | ICD-10-CM | POA: Diagnosis not present

## 2023-03-20 MED ORDER — ALLOPURINOL 100 MG PO TABS: 100.0000 mg | ORAL_TABLET | Freq: Two times a day (BID) | ORAL | 1 refills | Status: DC

## 2023-03-26 ENCOUNTER — Encounter (HOSPITAL_BASED_OUTPATIENT_CLINIC_OR_DEPARTMENT_OTHER): Payer: Self-pay | Admitting: Pulmonary Disease

## 2023-03-30 ENCOUNTER — Encounter: Payer: Self-pay | Admitting: Pharmacist

## 2023-03-30 DIAGNOSIS — E118 Type 2 diabetes mellitus with unspecified complications: Secondary | ICD-10-CM

## 2023-03-31 DIAGNOSIS — R922 Inconclusive mammogram: Secondary | ICD-10-CM | POA: Diagnosis not present

## 2023-04-01 ENCOUNTER — Other Ambulatory Visit (HOSPITAL_COMMUNITY): Payer: Self-pay

## 2023-04-01 ENCOUNTER — Encounter (HOSPITAL_COMMUNITY): Payer: Self-pay

## 2023-04-01 ENCOUNTER — Other Ambulatory Visit: Payer: Self-pay

## 2023-04-01 ENCOUNTER — Encounter: Payer: Self-pay | Admitting: Internal Medicine

## 2023-04-01 MED ORDER — MOUNJARO 2.5 MG/0.5ML ~~LOC~~ SOAJ
2.5000 mg | SUBCUTANEOUS | 0 refills | Status: DC
  Filled 2023-04-01: qty 2, 28d supply, fill #0

## 2023-04-01 NOTE — Addendum Note (Signed)
Addended by: Cheree Ditto on: 04/01/2023 09:09 AM   Modules accepted: Orders

## 2023-04-02 ENCOUNTER — Other Ambulatory Visit: Payer: Self-pay | Admitting: Internal Medicine

## 2023-04-09 ENCOUNTER — Ambulatory Visit: Payer: Medicare Other

## 2023-04-09 VITALS — Ht 63.0 in | Wt 190.0 lb

## 2023-04-09 DIAGNOSIS — Z0000 Encounter for general adult medical examination without abnormal findings: Secondary | ICD-10-CM | POA: Diagnosis not present

## 2023-04-09 NOTE — Progress Notes (Signed)
Subjective:   Rebecca Mcintyre is a 77 y.o. female who presents for Medicare Annual (Subsequent) preventive examination.  Visit Complete: Virtual I connected with  [name] on 04/09/23 by a audio enabled telemedicine application and verified that I am speaking with the correct person using two identifiers.  Patient Location: Home  Provider Location: Home Office  I discussed the limitations of evaluation and management by telemedicine. The patient expressed understanding and agreed to proceed.  Vital Signs: Because this visit was a virtual/telehealth visit, some criteria may be missing or patient reported. Any vitals not documented were not able to be obtained and vitals that have been documented are patient reported.  Patient Medicare AWV questionnaire was completed by the patient on 04/05/23; I have confirmed that all information answered by patient is correct and no changes since this date.  Cardiac Risk Factors include: advanced age (>31men, >69 women);hypertension     Objective:    Today's Vitals   04/09/23 0818  Weight: 190 lb (86.2 kg)  Height: 5\' 3"  (1.6 m)   Body mass index is 33.66 kg/m.     04/09/2023    8:26 AM 07/24/2022   11:00 AM 05/25/2022    1:17 PM 04/03/2022    8:31 AM 04/06/2021   10:22 AM 03/30/2021    3:42 PM 11/29/2020    6:10 AM  Advanced Directives  Does Patient Have a Medical Advance Directive? Yes Yes Yes Yes Yes Yes No  Type of Estate agent of [place];Living will Healthcare Power of [place];Living will Healthcare Power of [employer] of [place];Living will Healthcare Power of [place];Living will Healthcare Power of [place];Living will   Does patient want to make changes to medical advance directive?  No - Patient declined No - Patient declined  No - Patient declined    Copy of Healthcare Power of Attorney in Chart? No - copy requested No - copy requested No - copy requested, Physician notified No - copy  requested No - copy requested No - copy requested   Would patient like information on creating a medical advance directive?   No - Patient declined    No - Patient declined    Current Medications (verified) Outpatient Encounter Medications as of 04/09/2023  Medication Sig   ACCU-CHEK GUIDE test strip USE AS DIRECTED TWICE A DAY   Accu-Chek Softclix Lancets lancets Use as instructed twice a day Diagnosis E11.9   acetaminophen (TYLENOL) 500 MG tablet Take 1,000 mg by mouth every 6 (six) hours as needed for mild pain.   albuterol (PROVENTIL) (2.5 MG/3ML) 0.083% nebulizer solution Take 3 mLs (2.5 mg total) by nebulization every 6 (six) hours as needed for wheezing or shortness of breath.   albuterol (VENTOLIN HFA) 108 (90 Base) MCG/ACT inhaler Inhale 2 puffs into the lungs every 6 (six) hours as needed for wheezing or shortness of breath.   allopurinol (ZYLOPRIM) 100 MG tablet Take 1 tablet (100 mg total) by mouth 2 (two) times daily.   amoxicillin-clavulanate (AUGMENTIN) 875-125 MG tablet Take 1 tablet by mouth 2 (two) times daily. (Patient not taking: Reported on 04/09/2023)   arformoterol (BROVANA) 15 MCG/2ML NEBU Take 2 mLs (15 mcg total) by nebulization 2 (two) times daily.   aspirin 81 MG tablet Take 81 mg by mouth daily.   Blood Glucose Monitoring Suppl (ACCU-CHEK GUIDE) w/Device KIT Use as directed twice a day Diagnosis E11.9   Budeson-Glycopyrrol-Formoterol (BREZTRI AEROSPHERE) 160-9-4.8 MCG/ACT AERO Inhale 2 puffs into the lungs 2 (two)  times daily.   budesonide (PULMICORT) 0.5 MG/2ML nebulizer solution Take 2 mLs (0.5 mg total) by nebulization 2 (two) times daily.   calcium carbonate (TUMS EX) 750 MG chewable tablet Chew 750 mg by mouth daily as needed for heartburn.   doxycycline (VIBRA-TABS) 100 MG tablet Take 1 tablet (100 mg total) by mouth 2 (two) times daily.   FARXIGA 10 MG TABS tablet TAKE 1 TABLET BY MOUTH EVERY DAY   furosemide (LASIX) 40 MG tablet Take 1 tablet (40 mg total)  by mouth daily.   guaiFENesin (MUCINEX) 600 MG 12 hr tablet Take 2 tablets (1,200 mg total) by mouth 2 (two) times daily.   metFORMIN (GLUCOPHAGE) 500 MG tablet TAKE 2 TABLETS (1,000 MG TOTAL) BY MOUTH IN THE MORNING AND AT BEDTIME.   metoprolol tartrate (LOPRESSOR) 25 MG tablet Take 0.5 tablets (12.5 mg total) by mouth 2 (two) times daily.   nitroGLYCERIN (NITROSTAT) 0.4 MG SL tablet PLACE 1 TABLET UNDER THE TONGUE EVERY 5 MINUTES AS NEEDED. (Patient taking differently: Place 0.4 mg under the tongue every 5 (five) minutes as needed for chest pain.)   Polyethylene Glycol 400 (VISINE DRY EYE RELIEF OP) Place 1 drop into both eyes daily as needed (dry eyes).   potassium chloride (KLOR-CON M10) 10 MEQ tablet Take 1 tablet (10 mEq total) by mouth 2 (two) times daily.   revefenacin (YUPELRI) 175 MCG/3ML nebulizer solution Take 3 mLs (175 mcg total) by nebulization daily.   rOPINIRole (REQUIP) 0.5 MG tablet TAKE 1 TABLET AT BEDTIME FOR RESTLESS LEG   rosuvastatin (CRESTOR) 20 MG tablet TAKE 1 TABLET BY MOUTH EVERY DAY   tirzepatide (MOUNJARO) 2.5 MG/0.5ML Pen Inject 2.5 mg into the skin once a week.   No facility-administered encounter medications on file as of 04/09/2023.    Allergies (verified) Atorvastatin   History: Past Medical History:  Diagnosis Date   Acute on chronic respiratory failure with hypoxia and hypercapnia (HCC) 11/05/2018   Allergy    ASCUS on Pap smear    had repeat x 2 normal with Dr. Senaida Ores 2011   Coronary artery disease    Gout    Hyperlipidemia    Hypertension    Myocardial infarction [facility])    2002 95% proximal LAD stenosis with thrombus followed by 90% stenosis, the first diagonal 80% stenosis, circumflex 30% stenosis, circumflex obtuse marginal subbranch 70% stenosis, PDA 40% stenosis. She had a drug-eluting stent placement with a Pixel stent   Osteopenia    Solitary kidney    Past Surgical History:  Procedure Laterality Date   CARDIAC CATHETERIZATION      CORONARY ANGIOPLASTY     CORONARY ANGIOPLASTY WITH STENT PLACEMENT  2002   HEMORRHOID SURGERY  2011   LAD Stent angioplasty  2002   Right oophorectomy with salpingectomy     benign growth Dr. Perlie Gold   Family History  Problem Relation Age of Onset   Breast cancer Mother 64   Lung cancer Father    Heart attack Brother        Died age 71 MI   Colon cancer Brother    Heart disease Daughter 46       smoker and overweight    Esophageal cancer Neg Hx    Pancreatic cancer Neg Hx    Rectal cancer Neg Hx    Stomach cancer Neg Hx    Social History   Socioeconomic History   Marital status: Single    Spouse name: Not on file   Number of  children: 2   Years of education: 14   Highest education level: Associate degree: occupational, Scientist, product/process development, or vocational program  Occupational History   Occupation: semi retired    Associate Professor: Scientist, research (life sciences)  Tobacco Use   Smoking status: Former    Current packs/day: 0.00    Average packs/day: 1 pack/day for 39.0 years (39.0 ttl pk-yrs)    Types: Cigarettes    Start date: 11/04/1979    Quit date: 11/04/2018    Years since quitting: 4.4   Smokeless tobacco: Never  Vaping Use   Vaping status: Never Used  Substance and Sexual Activity   Alcohol use: Yes    Comment: 1-2 per month   Drug use: No   Sexual activity: Not on file  Other Topics Concern   Not on file  Social History Narrative   Lives alone   Left Handed   Drinks 1-2 cups caffeine daily      Enjoys company of 2 cats, watching TV and movies, read on her Ipad, social media   Social Determinants of Health   Financial Resource Strain: Low Risk  (04/05/2023)   Overall Financial Resource Strain (CARDIA)    Difficulty of Paying Living Expenses: Not hard at all  Food Insecurity: No Food Insecurity (04/05/2023)   Hunger Vital Sign    Worried About Running Out of Food in the Last Year: Never true    Ran Out of Food in the Last Year: Never true  Transportation Needs: No Transportation Needs  (04/05/2023)   PRAPARE - Administrator, Civil Service (Medical): No    Lack of Transportation (Non-Medical): No  Physical Activity: Inactive (04/05/2023)   Exercise Vital Sign    Days of Exercise per Week: 0 days    Minutes of Exercise per Session: 30 min  Stress: No Stress Concern Present (04/05/2023)   [employer] of Occupational Health - Occupational Stress Questionnaire    Feeling of Stress : Not at all  Social Connections: Unknown (04/05/2023)   Social Connection and Isolation Panel [NHANES]    Frequency of Communication with Friends and Family: More than three times a week    Frequency of Social Gatherings with Friends and Family: Three times a week    Attends Religious Services: Patient declined    Active Member of Clubs or Organizations: Yes    Attends Banker Meetings: 1 to 4 times per year    Marital Status: Divorced    Tobacco Counseling Counseling given: Not Answered   Clinical Intake:  Pre-visit preparation completed: Yes  Pain : No/denies pain     BMI - recorded: 33.66 Nutritional Status: BMI > 30  Obese Nutritional Risks: None Diabetes: Yes CBG done?: Yes (CBG 97 per patient) CBG resulted in Enter/ Edit results?: Yes Did pt. bring in CBG monitor from home?: No  How often do you need to have someone help you when you read instructions, pamphlets, or other written materials from your doctor or pharmacy?: 1 - Never  Interpreter Needed?: No  Information entered by :: Theresa Mulligan LPN   Activities of Daily Living    04/05/2023   10:43 AM 05/25/2022   10:00 PM  In your present state of health, do you have any difficulty performing the following activities:  Hearing? 0 1  Vision? 0 0  Difficulty concentrating or making decisions? 0 0  Walking or climbing stairs? 0 0  Dressing or bathing? 0 0  Doing errands, shopping? 0 0  Preparing Food and eating ?  N   Using the Toilet? N   In the past six months, have you accidently  leaked urine? N   Do you have problems with loss of bowel control? N   Managing your Medications? N   Managing your Finances? N   Housekeeping or managing your Housekeeping? N     Patient Care Team: Panosh, Neta Mends, MD as PCP - General (Internal Medicine) Rollene Rotunda, MD as PCP - Cardiology (Cardiology) Huel Cote, MD as Attending Physician (Obstetrics and Gynecology) Hilarie Fredrickson, MD as Attending Physician (Gastroenterology) Elise Benne, MD as Consulting Physician (Ophthalmology) Rollene Rotunda, MD as Consulting Physician (Cardiology) Oretha Milch, MD as Consulting Physician (Pulmonary Disease) Serena Colonel, MD as Consulting Physician (Otolaryngology) Sherrill Raring, [facility] (Pharmacist)  Indicate any recent Medical Services you may have received from other than [facility] providers in the past year (date may be approximate).     Assessment:   This is a routine wellness examination for [facility].  Hearing/Vision screen Hearing Screening - Comments:: Wears hearing aids   Goals Addressed               This Visit's Progress     Patient Stated (pt-stated)        Increase exercise and lose weight!       Depression Screen    04/09/2023    8:32 AM 11/27/2022    9:37 AM 07/24/2022   11:00 AM 04/15/2022   11:27 AM 04/03/2022    8:28 AM 12/24/2021    2:40 PM 05/22/2021    2:38 PM  PHQ 2/9 Scores  PHQ - 2 Score 0 0 0 0 0 0 0  PHQ- 9 Score   0 1   0    Fall Risk    04/09/2023    8:25 AM 04/05/2023   10:43 AM 11/27/2022    9:37 AM 11/26/2022    9:34 AM 10/01/2022   12:16 PM  Fall Risk   Falls in the past year? 0 0 0 0 0  Number falls in past yr: 0 0 0  0  Injury with Fall? 0 0 0  0  Risk for fall due to : No Fall Risks  Impaired balance/gait  Impaired balance/gait;Impaired mobility  Follow up Falls prevention discussed  Falls evaluation completed  Falls evaluation completed;Education provided;Falls prevention discussed    MEDICARE RISK AT HOME: Medicare Risk at  Home Any stairs in or around the home?: Yes If so, are there any without handrails?: No Home free of loose throw rugs in walkways, pet beds, electrical cords, etc?: Yes Adequate lighting in your home to reduce risk of falls?: Yes Life alert?: Yes Use of a cane, walker or w/c?: No Grab bars in the bathroom?: Yes Shower chair or bench in shower?: Yes Elevated toilet seat or a handicapped toilet?: Yes  TIMED UP AND GO:  Was the test performed?  No    Cognitive Function:        04/09/2023    8:26 AM 04/03/2022    8:31 AM 03/22/2020   10:38 AM  6CIT Screen  What Year? 0 points 0 points 0 points  What month? 0 points 0 points 0 points  What time? 0 points 0 points   Count back from 20 0 points 0 points 0 points  Months in reverse 0 points 0 points 0 points  Repeat phrase 0 points 0 points 0 points  Total Score 0 points 0 points     Immunizations Immunization History  Administered Date(s) Administered   Fluad Quad(high Dose 65+) 02/11/2019, 02/29/2020, 04/10/2021, 03/21/2022   Influenza Split 04/29/2011, 04/08/2012   Influenza Whole 02/16/2008, 03/15/2009, 03/20/2010   Influenza, High Dose Seasonal PF 04/14/2013, 02/14/2015, 02/28/2016, 03/10/2017, 03/25/2018   Influenza, Seasonal, Injecte, Preservative Fre 02/06/2023   Influenza,inj,Quad PF,6+ Mos 04/18/2014   Influenza,inj,quad, With Preservative 02/25/2019   Influenza-Unspecified 02/24/2017   Moderna Covid-19 Fall Seasonal Vaccine 26yrs & older 05/07/2022   PFIZER(Purple Top)SARS-COV-2 Vaccination 06/15/2019, 07/06/2019, 04/13/2020   Pneumococcal Conjugate-13 04/08/2012   Pneumococcal Polysaccharide-23 04/14/2013   Respiratory Syncytial Virus Vaccine,Recomb Aduvanted(Arexvy) 05/07/2022   Td 09/14/2007   Unspecified SARS-COV-2 Vaccination 04/25/2021   Zoster Recombinant(Shingrix) 06/30/2021, 10/12/2021   Zoster, Live 09/14/2007    TDAP status: Due, Education has been provided regarding the importance of this vaccine.  Advised may receive this vaccine at local pharmacy or Health Dept. Aware to provide a copy of the vaccination record if obtained from local pharmacy or Health Dept. Verbalized acceptance and understanding.  Flu Vaccine status: Up to date  Pneumococcal vaccine status: Up to date  Covid-19 vaccine status: Declined, Education has been provided regarding the importance of this vaccine but patient still declined. Advised may receive this vaccine at local pharmacy or Health Dept.or vaccine clinic. Aware to provide a copy of the vaccination record if obtained from local pharmacy or Health Dept. Verbalized acceptance and understanding.  Qualifies for Shingles Vaccine? Yes   Zostavax completed Yes   Shingrix Completed?: Yes  Screening Tests Health Maintenance  Topic Date Due   DTaP/Tdap/Td (2 - Tdap) 09/13/2017   OPHTHALMOLOGY EXAM  05/10/2022   COVID-19 Vaccine (6 - 2023-24 season) 01/26/2023   FOOT EXAM  04/16/2023   Lung Cancer Screening  05/29/2023   HEMOGLOBIN A1C  05/30/2023   Diabetic kidney evaluation - Urine ACR  11/27/2023   Diabetic kidney evaluation - eGFR measurement  02/28/2024   Medicare Annual Wellness (AWV)  04/08/2024   Pneumonia Vaccine 39+ Years old  Completed   INFLUENZA VACCINE  Completed   DEXA SCAN  Completed   Hepatitis C Screening  Completed   Zoster Vaccines- Shingrix  Completed   HPV VACCINES  Aged Out   MAMMOGRAM  Discontinued   Colonoscopy  Discontinued    Health Maintenance  Health Maintenance Due  Topic Date Due   DTaP/Tdap/Td (2 - Tdap) 09/13/2017   OPHTHALMOLOGY EXAM  05/10/2022   COVID-19 Vaccine (6 - 2023-24 season) 01/26/2023        Bone Density status: Completed 06/10/12. Results reflect: Bone density results: NORMAL. Repeat every   years.    Additional Screening:  Hepatitis C Screening: does qualify; Completed 03/10/17  Vision Screening: Recommended annual ophthalmology exams for early detection of glaucoma and other disorders of the  eye. Is the patient up to date with their annual eye exam?  Yes  Who is the provider or what is the name of the office in which the patient attends annual eye exams? Dr Shea Evans If pt is not established with a provider, would they like to be referred to a provider to establish care? No .   Dental Screening: Recommended annual dental exams for proper oral hygiene  Diabetic Foot Exam: Diabetic Foot Exam: Completed 04/15/22  Community Resource Referral / Chronic Care Management:  CRR required this visit?  No   CCM required this visit?  No     Plan:     I have personally reviewed and noted the following in the patient's chart:   Medical and social  history Use of alcohol, tobacco or illicit drugs  Current medications and supplements including opioid prescriptions. Patient is not currently taking opioid prescriptions. Functional ability and status Nutritional status Physical activity Advanced directives List of other physicians Hospitalizations, surgeries, and ER visits in previous 12 months Vitals Screenings to include cognitive, depression, and falls Referrals and appointments  In addition, I have reviewed and discussed with patient certain preventive protocols, quality metrics, and best practice recommendations. A written personalized care plan for preventive services as well as general preventive health recommendations were provided to patient.     Tillie Rung, LPN   [id number]   After Visit Summary: (MyChart) Due to this being a telephonic visit, the after visit summary with patients personalized plan was offered to patient via MyChart   Nurse Notes: None

## 2023-04-09 NOTE — Patient Instructions (Addendum)
Ms. Preslar , Thank you for taking time to come for your Medicare Wellness Visit. I appreciate your ongoing commitment to your health goals. Please review the following plan we discussed and let me know if I can assist you in the future.   Referrals/Orders/Follow-Ups/Clinician Recommendations:   This is a list of the screening recommended for you and due dates:  Health Maintenance  Topic Date Due   DTaP/Tdap/Td vaccine (2 - Tdap) 09/13/2017   Eye exam for diabetics  05/10/2022   COVID-19 Vaccine (6 - 2023-24 season) 01/26/2023   Complete foot exam   04/16/2023   Screening for Lung Cancer  05/29/2023   Hemoglobin A1C  05/30/2023   Yearly kidney health urinalysis for diabetes  11/27/2023   Yearly kidney function blood test for diabetes  02/28/2024   Medicare Annual Wellness Visit  04/08/2024   Pneumonia Vaccine  Completed   Flu Shot  Completed   DEXA scan (bone density measurement)  Completed   Hepatitis C Screening  Completed   Zoster (Shingles) Vaccine  Completed   HPV Vaccine  Aged Out   Mammogram  Discontinued   Colon Cancer Screening  Discontinued    Advanced directives: (Copy Requested) Please bring a copy of your health care power of attorney and living will to the office to be added to your chart at your convenience.  Next Medicare Annual Wellness Visit scheduled for next year: Yes

## 2023-04-11 ENCOUNTER — Other Ambulatory Visit (HOSPITAL_BASED_OUTPATIENT_CLINIC_OR_DEPARTMENT_OTHER): Payer: Self-pay | Admitting: Pulmonary Disease

## 2023-04-14 ENCOUNTER — Other Ambulatory Visit (HOSPITAL_BASED_OUTPATIENT_CLINIC_OR_DEPARTMENT_OTHER): Payer: Self-pay | Admitting: Pulmonary Disease

## 2023-04-15 ENCOUNTER — Telehealth: Payer: Self-pay | Admitting: Pulmonary Disease

## 2023-04-15 MED ORDER — ROPINIROLE HCL 0.5 MG PO TABS: ORAL_TABLET | ORAL | 0 refills | Status: DC

## 2023-04-15 NOTE — Telephone Encounter (Signed)
Patient needs refill of Ropinirole  Pharmacy [facility] [address]

## 2023-04-16 ENCOUNTER — Encounter: Payer: Self-pay | Admitting: Internal Medicine

## 2023-04-17 ENCOUNTER — Telehealth: Payer: Self-pay | Admitting: Pulmonary Disease

## 2023-04-17 DIAGNOSIS — J432 Centrilobular emphysema: Secondary | ICD-10-CM

## 2023-04-17 DIAGNOSIS — J9611 Chronic respiratory failure with hypoxia: Secondary | ICD-10-CM

## 2023-04-17 NOTE — Telephone Encounter (Signed)
[facility] needs a prescription and face to face notes with Dr.Alava for patient's bipap machine.  Fax number [phone]

## 2023-04-21 NOTE — Telephone Encounter (Signed)
[facility] calling back to check on status.

## 2023-04-21 NOTE — Telephone Encounter (Signed)
Rx sent to pharmacy   

## 2023-04-22 DIAGNOSIS — H6991 Unspecified Eustachian tube disorder, right ear: Secondary | ICD-10-CM | POA: Diagnosis not present

## 2023-04-22 DIAGNOSIS — H903 Sensorineural hearing loss, bilateral: Secondary | ICD-10-CM | POA: Diagnosis not present

## 2023-04-22 NOTE — Telephone Encounter (Signed)
New order placed for [facility].

## 2023-04-28 NOTE — Telephone Encounter (Signed)
Order addended. Thanks.

## 2023-04-28 NOTE — Telephone Encounter (Signed)
New, Rebecca Mcintyre; Rebecca Mcintyre, Rebecca Mcintyre  Hello, We need the order updated to say Heated Humidity with all supplies listed separately or ALL SUPPLIES AS NEEDED listed on the order.  This is required by insurance on all new orders. Thank you, Luellen Pucker

## 2023-05-05 ENCOUNTER — Telehealth: Payer: Self-pay | Admitting: Pulmonary Disease

## 2023-05-05 NOTE — Telephone Encounter (Signed)
Message from Carlton at [employer]   [place], Loreli Dollar, Carmie End Tomie China; Angus Seller, [facility],  The last note on the account states the following: 12/6 pre assist needed. 1. Per corp further testing to justify machine is needed. Reached out to rep via email.  It dosent say what is needed so I will have to email to get details. I will reply back as soon as I get a reply.  Thank you,  Luellen Pucker

## 2023-05-05 NOTE — Telephone Encounter (Signed)
[facility] calling. They still need RX and sleep study for this PT. See past tel encounters.   Rebecca Mcintyre [phone]

## 2023-05-05 NOTE — Telephone Encounter (Signed)
New, Loreli Dollar, Pietro Cassis; Angus Seller, [facility] Raven,  I got a reply back from our intake team. They are not sure why this was denied. We have what we think is needed and its been sent to [employer] for review and now re-review. We don't have a clear answer at this time. We have sent the Sleep study, ABG, and ONO for re-review. Mangers are aware of the issue. We are awaiting re-review.  someone should be reaching out as soon as we get details.  Thank you,  Luellen Pucker

## 2023-05-06 DIAGNOSIS — H6991 Unspecified Eustachian tube disorder, right ear: Secondary | ICD-10-CM | POA: Diagnosis not present

## 2023-05-06 DIAGNOSIS — H6501 Acute serous otitis media, right ear: Secondary | ICD-10-CM | POA: Diagnosis not present

## 2023-05-06 DIAGNOSIS — H903 Sensorineural hearing loss, bilateral: Secondary | ICD-10-CM | POA: Diagnosis not present

## 2023-05-08 ENCOUNTER — Telehealth: Payer: Self-pay

## 2023-05-08 NOTE — Telephone Encounter (Signed)
Mail PAPA pt's portion to be fill out and mail back, fax doctors portion 12/12/ 24

## 2023-05-13 NOTE — Telephone Encounter (Signed)
PT states all she needs is nose pads that go on her BIPAP She states she ordered them on line. She anticipates she will soon have issues with her 02 in the future. I told her to call me direct if she did.

## 2023-05-13 NOTE — Telephone Encounter (Signed)
Rebecca Mcintyre; Rebecca Mcintyre I will create a service ticket for her O2, to make sure everything is working correctly.

## 2023-05-13 NOTE — Progress Notes (Unsigned)
No chief complaint on file.   HPI: Patient  Rebecca Mcintyre  77 y.o. comes in today for Preventive Health Care visit   Resp dr Donnel Saxon farxiga   CV  hocrein  HLD crestro    Health Maintenance  Topic Date Due   DTaP/Tdap/Td (2 - Tdap) 09/13/2017   OPHTHALMOLOGY EXAM  05/10/2022   COVID-19 Vaccine (6 - 2024-25 season) 01/26/2023   FOOT EXAM  04/16/2023   Lung Cancer Screening  05/29/2023   HEMOGLOBIN A1C  05/30/2023   Diabetic kidney evaluation - Urine ACR  11/27/2023   Diabetic kidney evaluation - eGFR measurement  02/28/2024   Medicare Annual Wellness (AWV)  04/08/2024   Pneumonia Vaccine 90+ Years old  Completed   INFLUENZA VACCINE  Completed   DEXA SCAN  Completed   Hepatitis C Screening  Completed   Zoster Vaccines- Shingrix  Completed   HPV VACCINES  Aged Out   MAMMOGRAM  Discontinued   Colonoscopy  Discontinued   Health Maintenance Review LIFESTYLE:  Exercise:   Tobacco/ETS: Alcohol:  Sugar beverages: Sleep: Drug use: no HH of  Work:    ROS:  GEN/ HEENT: No fever, significant weight changes sweats headaches vision problems hearing changes, CV/ PULM; No chest pain shortness of breath cough, syncope,edema  change in exercise tolerance. GI /GU: No adominal pain, vomiting, change in bowel habits. No blood in the stool. No significant GU symptoms. SKIN/HEME: ,no acute skin rashes suspicious lesions or bleeding. No lymphadenopathy, nodules, masses.  NEURO/ PSYCH:  No neurologic signs such as weakness numbness. No depression anxiety. IMM/ Allergy: No unusual infections.  Allergy .   REST of 12 system review negative except as per HPI   Past Medical History:  Diagnosis Date   Acute on chronic respiratory failure with hypoxia and hypercapnia (HCC) 11/05/2018   Allergy    ASCUS on Pap smear    had repeat x 2 normal with Dr. Senaida Ores 2011   Coronary artery disease    Gout    Hyperlipidemia    Hypertension    Myocardial infarction [facility])    2002 95%  proximal LAD stenosis with thrombus followed by 90% stenosis, the first diagonal 80% stenosis, circumflex 30% stenosis, circumflex obtuse marginal subbranch 70% stenosis, PDA 40% stenosis. She had a drug-eluting stent placement with a Pixel stent   Osteopenia    Solitary kidney     Past Surgical History:  Procedure Laterality Date   CARDIAC CATHETERIZATION     CORONARY ANGIOPLASTY     CORONARY ANGIOPLASTY WITH STENT PLACEMENT  2002   HEMORRHOID SURGERY  2011   LAD Stent angioplasty  2002   Right oophorectomy with salpingectomy     benign growth Dr. Perlie Gold    Family History  Problem Relation Age of Onset   Breast cancer Mother 38   Lung cancer Father    Heart attack Brother        Died age 50 MI   Colon cancer Brother    Heart disease Daughter 27       smoker and overweight    Esophageal cancer Neg Hx    Pancreatic cancer Neg Hx    Rectal cancer Neg Hx    Stomach cancer Neg Hx     Social History   Socioeconomic History   Marital status: Single    Spouse name: Not on file   Number of children: 2   Years of education: 14   Highest education level: Associate degree: academic program  Occupational History   Occupation: semi retired    Associate Professor: Scientist, research (life sciences)  Tobacco Use   Smoking status: Former    Current packs/day: 0.00    Average packs/day: 1 pack/day for 39.0 years (39.0 ttl pk-yrs)    Types: Cigarettes    Start date: 11/04/1979    Quit date: 11/04/2018    Years since quitting: 4.5   Smokeless tobacco: Never  Vaping Use   Vaping status: Never Used  Substance and Sexual Activity   Alcohol use: Yes    Comment: 1-2 per month   Drug use: No   Sexual activity: Not on file  Other Topics Concern   Not on file  Social History Narrative   Lives alone   Left Handed   Drinks 1-2 cups caffeine daily      Enjoys company of 2 cats, watching TV and movies, read on her Ipad, social media   Social Drivers of Health   Financial Resource Strain: Low Risk  (05/10/2023)    Overall Financial Resource Strain (CARDIA)    Difficulty of Paying Living Expenses: Not hard at all  Food Insecurity: No Food Insecurity (05/10/2023)   Hunger Vital Sign    Worried About Running Out of Food in the Last Year: Never true    Ran Out of Food in the Last Year: Never true  Transportation Needs: No Transportation Needs (05/10/2023)   PRAPARE - Administrator, Civil Service (Medical): No    Lack of Transportation (Non-Medical): No  Physical Activity: Insufficiently Active (05/10/2023)   Exercise Vital Sign    Days of Exercise per Week: 4 days    Minutes of Exercise per Session: 20 min  Stress: No Stress Concern Present (05/10/2023)   [employer] of Occupational Health - Occupational Stress Questionnaire    Feeling of Stress : Not at all  Social Connections: Unknown (05/10/2023)   Social Connection and Isolation Panel [NHANES]    Frequency of Communication with Friends and Family: More than three times a week    Frequency of Social Gatherings with Friends and Family: More than three times a week    Attends Religious Services: Patient declined    Database administrator or Organizations: No    Attends Engineer, structural: 1 to 4 times per year    Marital Status: Divorced    Outpatient Medications Prior to Visit  Medication Sig Dispense Refill   ACCU-CHEK GUIDE test strip USE AS DIRECTED TWICE A DAY 100 strip 3   Accu-Chek Softclix Lancets lancets Use as instructed twice a day Diagnosis E11.9 100 each 3   acetaminophen (TYLENOL) 500 MG tablet Take 1,000 mg by mouth every 6 (six) hours as needed for mild pain.     albuterol (PROVENTIL) (2.5 MG/3ML) 0.083% nebulizer solution Take 3 mLs (2.5 mg total) by nebulization every 6 (six) hours as needed for wheezing or shortness of breath. 300 mL 11   albuterol (VENTOLIN HFA) 108 (90 Base) MCG/ACT inhaler Inhale 2 puffs into the lungs every 6 (six) hours as needed for wheezing or shortness of breath. 8.5 each 5    allopurinol (ZYLOPRIM) 100 MG tablet Take 1 tablet (100 mg total) by mouth 2 (two) times daily. 180 tablet 1   amoxicillin-clavulanate (AUGMENTIN) 875-125 MG tablet Take 1 tablet by mouth 2 (two) times daily. (Patient not taking: Reported on 04/09/2023) 14 tablet 0   arformoterol (BROVANA) 15 MCG/2ML NEBU Take 2 mLs (15 mcg total) by nebulization 2 (two) times daily. 120 mL  11   aspirin 81 MG tablet Take 81 mg by mouth daily.     Blood Glucose Monitoring Suppl (ACCU-CHEK GUIDE) w/Device KIT Use as directed twice a day Diagnosis E11.9 1 kit 2   Budeson-Glycopyrrol-Formoterol (BREZTRI AEROSPHERE) 160-9-4.8 MCG/ACT AERO Inhale 2 puffs into the lungs 2 (two) times daily. 3 each 3   budesonide (PULMICORT) 0.5 MG/2ML nebulizer solution Take 2 mLs (0.5 mg total) by nebulization 2 (two) times daily. 120 mL 12   calcium carbonate (TUMS EX) 750 MG chewable tablet Chew 750 mg by mouth daily as needed for heartburn.     doxycycline (VIBRA-TABS) 100 MG tablet Take 1 tablet (100 mg total) by mouth 2 (two) times daily. 10 tablet 0   FARXIGA 10 MG TABS tablet TAKE 1 TABLET BY MOUTH EVERY DAY 30 tablet 3   furosemide (LASIX) 40 MG tablet Take 1 tablet (40 mg total) by mouth daily. 90 tablet 1   guaiFENesin (MUCINEX) 600 MG 12 hr tablet Take 2 tablets (1,200 mg total) by mouth 2 (two) times daily. 60 tablet 0   metFORMIN (GLUCOPHAGE) 500 MG tablet TAKE 2 TABLETS (1,000 MG TOTAL) BY MOUTH IN THE MORNING AND AT BEDTIME. 360 tablet 1   metoprolol tartrate (LOPRESSOR) 25 MG tablet Take 0.5 tablets (12.5 mg total) by mouth 2 (two) times daily. 90 tablet 1   nitroGLYCERIN (NITROSTAT) 0.4 MG SL tablet PLACE 1 TABLET UNDER THE TONGUE EVERY 5 MINUTES AS NEEDED. (Patient taking differently: Place 0.4 mg under the tongue every 5 (five) minutes as needed for chest pain.) 25 tablet 1   Polyethylene Glycol 400 (VISINE DRY EYE RELIEF OP) Place 1 drop into both eyes daily as needed (dry eyes).     potassium chloride (KLOR-CON M10)  10 MEQ tablet Take 1 tablet (10 mEq total) by mouth 2 (two) times daily. 180 tablet 0   revefenacin (YUPELRI) 175 MCG/3ML nebulizer solution Take 3 mLs (175 mcg total) by nebulization daily. 90 mL 6   rOPINIRole (REQUIP) 0.5 MG tablet TAKE 1 TABLET AT BEDTIME FOR RESTLESS LEG 90 tablet 0   rosuvastatin (CRESTOR) 20 MG tablet TAKE 1 TABLET BY MOUTH EVERY DAY 90 tablet 3   tirzepatide (MOUNJARO) 2.5 MG/0.5ML Pen Inject 2.5 mg into the skin once a week. 2 mL 0   No facility-administered medications prior to visit.     EXAM:  There were no vitals taken for this visit.  There is no height or weight on file to calculate BMI. Wt Readings from Last 3 Encounters:  04/09/23 190 lb (86.2 kg)  02/28/23 190 lb (86.2 kg)  02/06/23 190 lb 12.8 oz (86.5 kg)    Physical Exam: Vital signs reviewed ZOX:WRUE is a well-developed well-nourished alert cooperative    who appearsr stated age in no acute distress.  HEENT: normocephalic atraumatic , Eyes: PERRL EOM's full, conjunctiva clear, Nares: paten,t no deformity discharge or tenderness., Ears: no deformity EAC's clear TMs with normal landmarks. Mouth: clear OP, no lesions, edema.  Moist mucous membranes. Dentition in adequate repair. NECK: supple without masses, thyromegaly or bruits. CHEST/PULM:  Clear to auscultation and percussion breath sounds equal no wheeze , rales or rhonchi. No chest wall deformities or tenderness. Breast: normal by inspection . No dimpling, discharge, masses, tenderness or discharge . CV: PMI is nondisplaced, S1 S2 no gallops, murmurs, rubs. Peripheral pulses are full without delay.No JVD .  ABDOMEN: Bowel sounds normal nontender  No guard or rebound, no hepato splenomegal no CVA tenderness.  No hernia.  Extremtities:  No clubbing cyanosis or edema, no acute joint swelling or redness no focal atrophy NEURO:  Oriented x3, cranial nerves 3-12 appear to be intact, no obvious focal weakness,gait within normal limits no abnormal  reflexes or asymmetrical SKIN: No acute rashes normal turgor, color, no bruising or petechiae. PSYCH: Oriented, good eye contact, no obvious depression anxiety, cognition and judgment appear normal. LN: no cervical axillary inguinal adenopathy  Lab Results  Component Value Date   WBC 10.0 02/06/2023   HGB 11.9 02/06/2023   HCT 38.3 02/06/2023   PLT 367 02/06/2023   GLUCOSE 120 (H) 02/28/2023   CHOL 124 11/27/2022   TRIG 175.0 (H) 11/27/2022   HDL 36.90 (L) 11/27/2022   LDLDIRECT 116.0 02/05/2019   LDLCALC 53 11/27/2022   ALT 8 02/28/2023   AST 18 02/28/2023   NA 139 02/28/2023   K 4.5 02/28/2023   CL 98 02/28/2023   CREATININE 1.30 (H) 02/28/2023   BUN 23 02/28/2023   CO2 23 02/28/2023   TSH 3.22 11/27/2022   INR 1.0 11/28/2020   HGBA1C 5.9 11/27/2022   MICROALBUR 29.8 (H) 11/27/2022    BP Readings from Last 3 Encounters:  02/28/23 (!) 119/53  02/06/23 (!) 144/62  12/26/22 130/78    Lab results Roosvelt Harps reviewed with patient   ASSESSMENT AND PLAN:  Discussed the following assessment and plan:  No diagnosis found. No follow-ups on file.  Patient Care Team: Saranne Crislip, Neta Mends, MD as PCP - General (Internal Medicine) Rollene Rotunda, MD as PCP - Cardiology (Cardiology) Huel Cote, MD as Attending Physician (Obstetrics and Gynecology) Hilarie Fredrickson, MD as Attending Physician (Gastroenterology) Elise Benne, MD as Consulting Physician (Ophthalmology) Rollene Rotunda, MD as Consulting Physician (Cardiology) Oretha Milch, MD as Consulting Physician (Pulmonary Disease) Serena Colonel, MD as Consulting Physician (Otolaryngology) Sherrill Raring, [facility] (Pharmacist) There are no Patient Instructions on file for this visit.  Neta Mends. Ambar Raphael M.D.

## 2023-05-14 ENCOUNTER — Encounter: Payer: Self-pay | Admitting: Internal Medicine

## 2023-05-14 ENCOUNTER — Ambulatory Visit: Payer: Medicare Other | Admitting: Internal Medicine

## 2023-05-14 VITALS — BP 100/60 | HR 58 | Temp 98.0°F | Ht 63.0 in | Wt 201.4 lb

## 2023-05-14 DIAGNOSIS — Z79899 Other long term (current) drug therapy: Secondary | ICD-10-CM

## 2023-05-14 DIAGNOSIS — Z0000 Encounter for general adult medical examination without abnormal findings: Secondary | ICD-10-CM

## 2023-05-14 DIAGNOSIS — E119 Type 2 diabetes mellitus without complications: Secondary | ICD-10-CM

## 2023-05-14 DIAGNOSIS — I10 Essential (primary) hypertension: Secondary | ICD-10-CM | POA: Diagnosis not present

## 2023-05-14 DIAGNOSIS — E559 Vitamin D deficiency, unspecified: Secondary | ICD-10-CM | POA: Diagnosis not present

## 2023-05-14 DIAGNOSIS — G2581 Restless legs syndrome: Secondary | ICD-10-CM | POA: Diagnosis not present

## 2023-05-14 DIAGNOSIS — Z8739 Personal history of other diseases of the musculoskeletal system and connective tissue: Secondary | ICD-10-CM

## 2023-05-14 DIAGNOSIS — E785 Hyperlipidemia, unspecified: Secondary | ICD-10-CM

## 2023-05-14 DIAGNOSIS — I2510 Atherosclerotic heart disease of native coronary artery without angina pectoris: Secondary | ICD-10-CM | POA: Diagnosis not present

## 2023-05-14 LAB — LIPID PANEL
Cholesterol: 122 mg/dL (ref 0–200)
HDL: 46.1 mg/dL (ref >39.00)
LDL Cholesterol: 51 mg/dL (ref 0–99)
NonHDL: 76.33
Total CHOL/HDL Ratio: 3
Triglycerides: 129 mg/dL (ref 0.0–149.0)
VLDL: 25.8 mg/dL (ref 0.0–40.0)

## 2023-05-14 LAB — BASIC METABOLIC PANEL
BUN: 25 mg/dL — ABNORMAL HIGH (ref 6–23)
CO2: 30 meq/L (ref 19–32)
Calcium: 9.5 mg/dL (ref 8.4–10.5)
Chloride: 103 meq/L (ref 96–112)
Creatinine, Ser: 1.05 mg/dL (ref 0.40–1.20)
GFR: 51.58 mL/min — ABNORMAL LOW (ref >60.00)
Glucose, Bld: 96 mg/dL (ref 70–99)
Potassium: 4.1 meq/L (ref 3.5–5.1)
Sodium: 140 meq/L (ref 135–145)

## 2023-05-14 LAB — HEPATIC FUNCTION PANEL
ALT: 12 U/L (ref 0–35)
AST: 16 U/L (ref 0–37)
Albumin: 4.2 g/dL (ref 3.5–5.2)
Alkaline Phosphatase: 79 U/L (ref 39–117)
Bilirubin, Direct: 0.1 mg/dL (ref 0.0–0.3)
Total Bilirubin: 0.5 mg/dL (ref 0.2–1.2)
Total Protein: 7 g/dL (ref 6.0–8.3)

## 2023-05-14 LAB — VITAMIN D 25 HYDROXY (VIT D DEFICIENCY, FRACTURES): VITD: 10.93 ng/mL — ABNORMAL LOW (ref 30.00–100.00)

## 2023-05-14 LAB — HEMOGLOBIN A1C: Hgb A1c MFr Bld: 6.4 % (ref 4.6–6.5)

## 2023-05-14 LAB — URIC ACID: Uric Acid, Serum: 4.1 mg/dL (ref 2.4–7.0)

## 2023-05-14 LAB — TSH: TSH: 2.24 u[IU]/mL (ref 0.35–5.50)

## 2023-05-14 MED ORDER — ROPINIROLE HCL 0.5 MG PO TABS: ORAL_TABLET | ORAL | 1 refills | Status: DC

## 2023-05-14 MED ORDER — METFORMIN HCL 500 MG PO TABS: 500.0000 mg | ORAL_TABLET | Freq: Two times a day (BID) | ORAL | 3 refills | Status: DC

## 2023-05-14 NOTE — Patient Instructions (Signed)
Good to see you today . Refilling the requip and will send a flag to dr Vassie Loll about this since things are stable.  Update ding labs Will share results with team.

## 2023-05-15 DIAGNOSIS — E119 Type 2 diabetes mellitus without complications: Secondary | ICD-10-CM | POA: Diagnosis not present

## 2023-05-15 LAB — HM DIABETES EYE EXAM

## 2023-05-15 NOTE — Telephone Encounter (Signed)
Received pt's PAP portion AZ&ME Rebecca Mcintyre

## 2023-05-15 NOTE — Progress Notes (Signed)
Results stable A1c  6.4 , ldl 51  thyroid in range an uric acid in goal range  However VIt D is quite low    I advise supplement   vit D supplement 25,000 units per week x 8 weeks ( RX disp 8) after that begin 1000 international units  vit d3 per day( otc)  Check  vit d level in 3-4 months

## 2023-05-16 ENCOUNTER — Other Ambulatory Visit: Payer: Self-pay

## 2023-05-16 DIAGNOSIS — E559 Vitamin D deficiency, unspecified: Secondary | ICD-10-CM

## 2023-05-22 ENCOUNTER — Other Ambulatory Visit: Payer: Self-pay

## 2023-05-22 MED ORDER — VITAMIN D (ERGOCALCIFEROL) 1.25 MG (50000 UNIT) PO CAPS: 50000.0000 [IU] | ORAL_CAPSULE | ORAL | 0 refills | Status: DC

## 2023-05-27 ENCOUNTER — Telehealth: Payer: Self-pay | Admitting: Pulmonary Disease

## 2023-05-27 NOTE — Telephone Encounter (Signed)
 PT states she is not well at all and needs meds before it exacerbates COPD. She is on 02 24 hours a day.    Covid Poss on 12/20 Congestion is Chest & nose Ear hurts  Pharm is [facility] [address]  Typically antibx and pred called in.   [phone] is her #

## 2023-05-29 ENCOUNTER — Telehealth: Payer: Self-pay

## 2023-05-29 NOTE — Telephone Encounter (Signed)
 Spoke to pt.  Pt reports she got tested positive for covid on Monday. She states she did previous got a prescription sent in when she had covid. Inform pt she would need an appt for prescription to be sent over.   Ask pt of sx. Pt reports sx of chills, bodyache, fatigue, productive clear cough, congested, SOB even with using 24hr oxygen . Denied fever, headache. Pt reports she is taking mucinex  2x a day. When offer to schedule to be seen, pt states she is not going to visit doctor today and has all the tools she needs. She said she just wanted to know if provider her send in prescription for covid. If not, it is fine. She will call her pulmonology.   Advise pt to go to nearest urgent care if feels worse.   Forwarding to provider

## 2023-05-29 NOTE — Telephone Encounter (Signed)
 Copied from CRM [id number]. Topic: Clinical - Medical Advice >> May 26, 2023  2:39 PM Elizebeth Brooking wrote: Reason for CRM: Patient has been diagnose with COVID, wanted to know if Dr. Fabian Sharp would send her something. Also requesting a callback

## 2023-05-30 MED ORDER — DOXYCYCLINE HYCLATE 100 MG PO TABS: 100.0000 mg | ORAL_TABLET | Freq: Two times a day (BID) | ORAL | 0 refills | Status: DC

## 2023-05-30 MED ORDER — PREDNISONE 10 MG PO TABS: ORAL_TABLET | ORAL | 0 refills | Status: DC

## 2023-05-30 NOTE — Telephone Encounter (Signed)
 Detailed message left on identifiable machine belonging to the patient. Advised to call and make an appointment for follow up.

## 2023-06-13 ENCOUNTER — Other Ambulatory Visit: Payer: Self-pay | Admitting: Internal Medicine

## 2023-06-17 NOTE — Telephone Encounter (Signed)
Pt was inform that at this time she is Denied from AZ&ME due to been over the 3% above the poverty limit,pt needs to show proof of income above 3%,pt will fax her taxforms or mail then out, will follow up in a few days.

## 2023-06-17 NOTE — Telephone Encounter (Signed)
Pt call back regarding pap on AZ&ME [name]) pt ask if we can call AZ&ME regarding her refill,AZ&ME said pt is DENIED at this time due to been above the 3% poverty level. Marylene Land [facility] said last year they had to write a letter on her behalf that she is going to follow up,pt needs proof on income tax forms.will follow up

## 2023-06-23 ENCOUNTER — Ambulatory Visit: Payer: Medicare Other | Admitting: Cardiology

## 2023-06-26 DIAGNOSIS — H6502 Acute serous otitis media, left ear: Secondary | ICD-10-CM | POA: Diagnosis not present

## 2023-06-26 DIAGNOSIS — H903 Sensorineural hearing loss, bilateral: Secondary | ICD-10-CM | POA: Diagnosis not present

## 2023-06-26 DIAGNOSIS — J019 Acute sinusitis, unspecified: Secondary | ICD-10-CM | POA: Diagnosis not present

## 2023-06-26 DIAGNOSIS — J342 Deviated nasal septum: Secondary | ICD-10-CM | POA: Diagnosis not present

## 2023-07-13 ENCOUNTER — Other Ambulatory Visit: Payer: Self-pay | Admitting: Internal Medicine

## 2023-07-27 NOTE — Progress Notes (Unsigned)
 Cardiology Office Note:   Date:  07/29/2023  ID:  Rebecca Mcintyre, DOB 1945-11-23, MRN [id number] PCP: Madelin Headings, MD  [facility] Providers Cardiologist:  Rollene Rotunda, MD {  History of Present Illness:   Rebecca Mcintyre is a 78 y.o. female who presents for evaluation of CAD. I saw her in 2014.  She has a history of an anterior MI in 2002.   She had DES to the LAD.    She was in the hospital after syncopal episode in June 2020.    She had acute hypoxic respiratory failure.  She had acute on chronic diastolic dysfunction.  She had acute cor pulmonale.  She did require some diuresis.  She had an echocardiogram which demonstrated well-preserved left ventricular function with some suggestion of diastolic dysfunction.  There was elevated pulmonary pressures noted.  She had moderate RV dysfunction.  She did have diuresis of 10.2 L.  She was sent home on 40 mg of Lasix.  She was also sent home on home O2 and pulmonary meds as listed.  She did have a follow-up echo.  Her pulmonary pressures seem to be reduced and RV function improved.  In July 2022 she was hospitalized with acute on chronic respiratory failure with acute on chronic diastolic HF.  She had acute encephalopathy thought to be related to narcotics.  During that hospitalization she had heart failure with preserved ejection fraction require diuresis.  She had an elevated troponin which was felt to be demand ischemia.  Echo demonstrated no new wall motion abnormalities.    She returns for follow up. The patient denies any new symptoms such as chest discomfort, neck or arm discomfort. There has been no new shortness of breath, PND or orthopnea. There have been no reported palpitations, presyncope or syncope.   She had problem with upper respiratory infection after spending some time with her young grandchildren.  She had sick and was nauseated and did come off of Mounjaro for a while and is been given instructions to restart this.  She is  finally recovered from this and overall feels pretty good.  She is on chronic O2. The patient denies any new symptoms such as chest discomfort, neck or arm discomfort. There has been no new shortness of breath, PND or orthopnea. There have been no reported palpitations, presyncope or syncope.   ROS: As stated in the HPI and negative for all other systems.  Studies Reviewed:    EKG:   EKG Interpretation Date/Time:  Tuesday July 29 2023 14:32:28 EST Ventricular Rate:  78 PR Interval:  160 QRS Duration:  84 QT Interval:  414 QTC Calculation: 471 R Axis:   72  Text Interpretation: Normal sinus rhythm Low voltage QRS Poor anterior R wave progression When compared with ECG of 25-May-2022 13:11, No significant change since last tracing Confirmed by Rollene Rotunda ([id number]) on 07/29/2023 2:39:56 PM    Risk Assessment/Calculations:              Physical Exam:   VS:  BP 119/70   Pulse 78   Ht 5\' 10"  (1.778 m)   Wt 206 lb 12.8 oz (93.8 kg)   SpO2 96%   BMI 29.67 kg/m    Wt Readings from Last 3 Encounters:  07/29/23 206 lb 12.8 oz (93.8 kg)  05/14/23 201 lb 6.4 oz (91.4 kg)  04/09/23 190 lb (86.2 kg)     GEN: Well nourished, well developed in no acute distress NECK: No JVD; No  carotid bruits CARDIAC: RRR, no murmurs, rubs, gallops RESPIRATORY:  Clear to auscultation without rales, wheezing or rhonchi  ABDOMEN: Soft, non-tender, non-distended EXTREMITIES:  No edema; No deformity   ASSESSMENT AND PLAN:   CAD:  The patient has no new sypmtoms.  No further cardiovascular testing is indicated.  We will continue with aggressive risk reduction and meds as listed.  DYSLIPIDEMIA:     LDL was 57.  No change in therapy.   HTN:  The blood pressure is well-controlled.  No change in therapy.  DIABETES:  Her hemoglobin A1c was up slightly to 6.4 but she was on steroids.  This is being managed by her primary provider.    CHRONIC DIASTOLIC DYSFUNCTION:   Her creat was 1.05.  She is rarely  needing Lasix.  I think she has done much better with a 45 pound weight loss on her Mounjaro.  No change in therapy.      Follow up with me in 6 months.  Signed, Rollene Rotunda, MD

## 2023-07-29 ENCOUNTER — Ambulatory Visit: Payer: Medicare Other | Attending: Cardiology | Admitting: Cardiology

## 2023-07-29 ENCOUNTER — Encounter: Payer: Self-pay | Admitting: Cardiology

## 2023-07-29 VITALS — BP 119/70 | HR 78 | Ht 70.0 in | Wt 206.8 lb

## 2023-07-29 DIAGNOSIS — I5032 Chronic diastolic (congestive) heart failure: Secondary | ICD-10-CM

## 2023-07-29 DIAGNOSIS — E118 Type 2 diabetes mellitus with unspecified complications: Secondary | ICD-10-CM

## 2023-07-29 DIAGNOSIS — I10 Essential (primary) hypertension: Secondary | ICD-10-CM | POA: Diagnosis not present

## 2023-07-29 DIAGNOSIS — I2510 Atherosclerotic heart disease of native coronary artery without angina pectoris: Secondary | ICD-10-CM

## 2023-07-29 DIAGNOSIS — E785 Hyperlipidemia, unspecified: Secondary | ICD-10-CM | POA: Diagnosis not present

## 2023-07-29 MED ORDER — NITROGLYCERIN 0.4 MG SL SUBL: 0.4000 mg | SUBLINGUAL_TABLET | SUBLINGUAL | 3 refills | Status: AC | PRN

## 2023-07-29 NOTE — Patient Instructions (Signed)
 Medication Instructions:  Refill for nitroglycerin sent. *If you need a refill on your cardiac medications before your next appointment, please call your pharmacy*  Follow-Up: At [facility], you and your health needs are our priority.  As part of our continuing mission to provide you with exceptional heart care, we have created designated Provider Care Teams.  These Care Teams include your primary Cardiologist (physician) and Advanced Practice Providers (APPs -  Physician Assistants and Nurse Practitioners) who all work together to provide you with the care you need, when you need it.  We recommend signing up for the patient portal called "MyChart".  Sign up information is provided on this After Visit Summary.  MyChart is used to connect with patients for Virtual Visits (Telemedicine).  Patients are able to view lab/test results, encounter notes, upcoming appointments, etc.  Non-urgent messages can be sent to your provider as well.   To learn more about what you can do with MyChart, go to [email or web address].    Your next appointment:   6 month(s)  Provider:   Rollene Rotunda, MD     Other Instructions

## 2023-08-01 ENCOUNTER — Telehealth: Payer: Self-pay | Admitting: Pulmonary Disease

## 2023-08-01 NOTE — Telephone Encounter (Signed)
 Aspen from [facility] is calling about a fax in reference to oxygen for this patient.

## 2023-08-01 NOTE — Telephone Encounter (Signed)
 Left VM for Aspen to return call on Monday I also will try again on Monday morning

## 2023-08-04 NOTE — Telephone Encounter (Signed)
 [facility] needs most recent OV notes, notes faxed

## 2023-08-07 ENCOUNTER — Telehealth: Payer: Self-pay | Admitting: Pulmonary Disease

## 2023-08-07 ENCOUNTER — Encounter: Payer: Self-pay | Admitting: Internal Medicine

## 2023-08-07 MED ORDER — ALBUTEROL SULFATE HFA 108 (90 BASE) MCG/ACT IN AERS: 2.0000 | INHALATION_SPRAY | Freq: Four times a day (QID) | RESPIRATORY_TRACT | 1 refills | Status: DC | PRN

## 2023-08-07 NOTE — Telephone Encounter (Signed)
 Patient states needs refill for Albuterol inhaler. Patient scheduled 10/09/2023 with Dr. Vassie Loll. Pharmacy is [facility] [address] Patient phone number is [phone].

## 2023-08-07 NOTE — Telephone Encounter (Signed)
 Rx sent to pharmacy, pt notified on VM

## 2023-08-07 NOTE — Telephone Encounter (Signed)
 [facility] calling for office notes and written order for 02 for this PT. Fax from them has been sent. Their fax # is [phone]

## 2023-08-08 DIAGNOSIS — J329 Chronic sinusitis, unspecified: Secondary | ICD-10-CM | POA: Diagnosis not present

## 2023-08-08 DIAGNOSIS — H6591 Unspecified nonsuppurative otitis media, right ear: Secondary | ICD-10-CM | POA: Diagnosis not present

## 2023-08-08 MED ORDER — DAPAGLIFLOZIN PROPANEDIOL 10 MG PO TABS: 10.0000 mg | ORAL_TABLET | Freq: Every day | ORAL | 3 refills | Status: DC

## 2023-08-08 NOTE — Telephone Encounter (Signed)
 NFN

## 2023-08-12 ENCOUNTER — Telehealth: Payer: Self-pay | Admitting: Pulmonary Disease

## 2023-08-12 NOTE — Telephone Encounter (Signed)
 [facility] needs oxygen prescription and office visit notes to be faxed to [phone]. They already have Bipap script but need the oxygen.

## 2023-08-19 ENCOUNTER — Telehealth: Payer: Self-pay | Admitting: Internal Medicine

## 2023-08-20 NOTE — Telephone Encounter (Signed)
 Copied from CRM [id number]. Topic: General - Other >> Aug 20, 2023  2:21 PM Theodis Sato wrote: Reason for CRM: Morrie Sheldon from [facility] is requesting the patients sleep study documents from the last 6 months and order for CPAP supplies as well as notes on why patient needs CPAP. Please fax these documents [facility] at [phone]  CMN will be signed 3/31 when provider is in office will document when this form has been faxed

## 2023-08-25 ENCOUNTER — Other Ambulatory Visit: Payer: Self-pay | Admitting: Internal Medicine

## 2023-08-25 NOTE — Telephone Encounter (Signed)
 Signed and folder placed on your desk

## 2023-08-26 NOTE — Telephone Encounter (Signed)
 CMN signed and faxed. Nothing further needed

## 2023-09-03 NOTE — Telephone Encounter (Signed)
 Following up with Mrs.Cutright pt was denied for [employer] AZ&ME do to proof of income,gave pt a call pt did not  answer left a HIPAA VM. Spoke with pt last time said she was going to mail her tax forms but have not received. Unsure if pt wants to persue with it application

## 2023-09-08 ENCOUNTER — Other Ambulatory Visit (INDEPENDENT_AMBULATORY_CARE_PROVIDER_SITE_OTHER): Payer: Medicare Other

## 2023-09-08 DIAGNOSIS — E559 Vitamin D deficiency, unspecified: Secondary | ICD-10-CM | POA: Diagnosis not present

## 2023-09-08 LAB — VITAMIN D 25 HYDROXY (VIT D DEFICIENCY, FRACTURES): VITD: 48.76 ng/mL (ref 30.00–100.00)

## 2023-09-09 ENCOUNTER — Encounter: Payer: Self-pay | Admitting: Internal Medicine

## 2023-09-09 NOTE — Progress Notes (Signed)
 VIt D is much better  now       stay on vit d3 1000  I u per day

## 2023-09-13 ENCOUNTER — Other Ambulatory Visit: Payer: Self-pay | Admitting: Family

## 2023-09-17 ENCOUNTER — Encounter: Payer: Self-pay | Admitting: Internal Medicine

## 2023-09-18 ENCOUNTER — Other Ambulatory Visit: Payer: Self-pay | Admitting: Internal Medicine

## 2023-09-18 NOTE — Telephone Encounter (Unsigned)
 Copied from CRM [id number]. Topic: Clinical - Medication Refill >> Sep 18, 2023  9:19 AM Georgeann Kindred wrote: Most Recent Primary Care Visit:  Provider: [facility]  Department: [facility]  Visit Type: LAB  Date: 09/08/2023  Medication: allopurinol  (ZYLOPRIM ) 100 MG tablet  Has the patient contacted their pharmacy? Yes (Agent: If no, request that the patient contact the pharmacy for the refill. If patient does not wish to contact the pharmacy document the reason why and proceed with request.) (Agent: If yes, when and what did the pharmacy advise?) No response from provider. Advised to reach out to provider  Is this the correct pharmacy for this prescription? Yes If no, delete pharmacy and type the correct one.  This is the patient's preferred pharmacy:  [id number] [place], [place] - [address] [place] [address] Phone: [phone] Fax: [phone]   Has the prescription been filled recently? No  Is the patient out of the medication? No 2 days remaining   Has the patient been seen for an appointment in the last year OR does the patient have an upcoming appointment? Yes  Can we respond through MyChart? Yes  Agent: Please be advised that Rx refills may take up to 3 business days. We ask that you follow-up with your pharmacy.

## 2023-09-19 ENCOUNTER — Other Ambulatory Visit: Payer: Self-pay | Admitting: Internal Medicine

## 2023-09-19 DIAGNOSIS — H6991 Unspecified Eustachian tube disorder, right ear: Secondary | ICD-10-CM | POA: Diagnosis not present

## 2023-09-19 DIAGNOSIS — H6591 Unspecified nonsuppurative otitis media, right ear: Secondary | ICD-10-CM | POA: Diagnosis not present

## 2023-09-19 MED ORDER — ALLOPURINOL 100 MG PO TABS: 100.0000 mg | ORAL_TABLET | Freq: Two times a day (BID) | ORAL | 1 refills | Status: DC

## 2023-09-19 NOTE — Telephone Encounter (Signed)
 Copied from CRM [id number]. Topic: Clinical - Prescription Issue >> Sep 19, 2023  9:29 AM Martinique E wrote: Reason for CRM: Patient called in stating that she has been trying to get a refill of her allopurinol  (ZYLOPRIM ) 100 MG tablet. Advised patient that refills may take up to 3 days, but patient stated that the pharmacy tried reordering on 4/19, and then agent could see a nurse tried reordering on 4/24. Patient would just like some clarification on this issue. Callback number [phone].

## 2023-09-23 ENCOUNTER — Telehealth: Payer: Self-pay | Admitting: Internal Medicine

## 2023-09-23 NOTE — Telephone Encounter (Signed)
 Spke to pt. Pt reports she received the Rx and expressed that she was frustrated why it takes so long to get back to her.   Apologize to pt about the delay and explain that this cma is off on Friday and do get delay in responding back and doing best to respond to all pt back.   Pt verbalized understanding.

## 2023-09-23 NOTE — Telephone Encounter (Signed)
 Cmn received from [facility] for CPAP supplies.

## 2023-10-07 NOTE — Telephone Encounter (Signed)
 No notes a signed CMN was sent back to [facility]. I am resubmitting a fax sent today for Dr. Dione Franks to sign.

## 2023-10-08 ENCOUNTER — Other Ambulatory Visit: Payer: Self-pay | Admitting: Internal Medicine

## 2023-10-08 NOTE — Telephone Encounter (Signed)
 Rc'd Dr. Rayne Callas signed CRM and will fax to [phone] w/Office notes,

## 2023-10-08 NOTE — Telephone Encounter (Signed)
 Ok to refill 90 days x 2  tell patient she will be  due for hg A1c 6  mos fu appt  in June

## 2023-10-09 ENCOUNTER — Ambulatory Visit (HOSPITAL_BASED_OUTPATIENT_CLINIC_OR_DEPARTMENT_OTHER): Payer: Medicare Other | Admitting: Pulmonary Disease

## 2023-10-09 ENCOUNTER — Encounter (HOSPITAL_BASED_OUTPATIENT_CLINIC_OR_DEPARTMENT_OTHER): Payer: Self-pay | Admitting: Pulmonary Disease

## 2023-10-09 VITALS — BP 144/60 | HR 71 | Ht 70.0 in | Wt 210.0 lb

## 2023-10-09 DIAGNOSIS — J9612 Chronic respiratory failure with hypercapnia: Secondary | ICD-10-CM | POA: Diagnosis not present

## 2023-10-09 DIAGNOSIS — J9611 Chronic respiratory failure with hypoxia: Secondary | ICD-10-CM

## 2023-10-09 DIAGNOSIS — J432 Centrilobular emphysema: Secondary | ICD-10-CM

## 2023-10-09 NOTE — Patient Instructions (Signed)
 VISIT SUMMARY:  Today, you came in for a routine follow-up visit to manage your severe COPD and chronic respiratory failure. We discussed your current symptoms, medications, and any new concerns you have.  YOUR PLAN:  -SEVERE COPD: Severe COPD is a chronic lung disease that makes it hard to breathe. You are currently managing your symptoms with Breztri  twice daily and a nebulizer for shortness of breath. We will start the approval process for a new nebulizer medication called Ohtuwayre, which may help relieve your symptoms without causing tremors or palpitations. Continue using Breztri  twice daily, and we will provide you with educational material on O2 Wear.  -CHRONIC RESPIRATORY FAILURE: Chronic respiratory failure is a condition where your lungs can't get enough oxygen  into your blood. Your oxygen  saturation is currently 92%. You experience occasional swelling in your feet, which you manage with Lasix  as needed. Monitor your weight closely and use Lasix  if you notice a sudden weight increase of three pounds or more. We will increase your oxygen  flow to five liters and monitor for improvement in the discoloration of your feet. Remember to elevate your feet when sitting.

## 2023-10-09 NOTE — Progress Notes (Signed)
 Subjective:    Patient ID: Rebecca Mcintyre, female    DOB: 09-25-1945, 78 y.o.   MRN: [id number]  HPI  78 yo ex-smoker for follow-up of COPD , chronic hypoxic /hypercarbic respiratory failure and pulmonary hypertension -on astral-avaps, POC Identical twin of Rebecca Mcintyre ,my patient who passed away -not a candidate for Zephyr valve [facility] )   PMH - CAD, diastolic HF,  dyslipidemia, obesity  - hospitalized 10/2018 for acute on chronic hypercarbic respiratory failure -slow to resolve COPD exacerbation 01/2020 - hospitalized 11/2020  for right lower extremity injury, course complicated by acute diastolic heart failure and encephalopathy due to narcotics elevated troponin  -hosp 04/2022 for flare   12/26/22 OV On ambulation she is able to maintain saturation with 5 L pulse POC   02/2023 LLL pna  6 month FU visit  She uses oxygen  at five liters per minute, adjusting based on activity level. Occasional shortness of breath is managed with a nebulizer. Her current medication regimen includes Breztri  twice daily. She no longer uses budesonide  or Upelri nebulizers. Her oxygen  saturation is 92%. She experiences swelling in her feet, managed with Lasix  as needed, and elevates her feet regularly. There is purplish discoloration in her feet.  Significant tests/ events reviewed   ABG 05/2022 7.36/49/69/93% ABG 01/2022 7.4 7/57/65 on 3 L oxygen  ABG 02/2020 7.4 2/44/53/87%     PFTs 05/2022 FEV1 0.73 /37%-improved to 0.89 postbronchodilator , DLCO 3.43   PFTs 12/29/2018 FVC 2.31 (82), FEV1 1.20 (56), ratio 52, no bronchodilatory response, DLCO 51 %   10/2018 CTA Chest- No PE; cardiomegaly, emphysema       NPSG 12/13/18  no obstructive sleep apnea, min O2 89%. Periodic limb movements did occur during sleep  Review of Systems neg for any significant sore throat, dysphagia, itching, sneezing, nasal congestion or excess/ purulent secretions, fever, chills, sweats, unintended wt loss, pleuritic or exertional  cp, hempoptysis, orthopnea pnd or change in chronic leg swelling. Also denies presyncope, palpitations, heartburn, abdominal pain, nausea, vomiting, diarrhea or change in bowel or urinary habits, dysuria,hematuria, rash, arthralgias, visual complaints, headache, numbness weakness or ataxia.     Objective:   Physical Exam  Gen. Pleasant, obese, in no distress ENT - no lesions, no post nasal drip Neck: No JVD, no thyromegaly, no carotid bruits Lungs: no use of accessory muscles, no dullness to percussion, decreased without rales or rhonchi  Cardiovascular: Rhythm regular, heart sounds  normal, no murmurs or gallops, no peripheral edema Musculoskeletal: No deformities, no cyanosis or clubbing , no tremors       Assessment & Plan:    Severe COPD Severe COPD, stage 4. She reports no worsening of symptoms and manages daily activities better. She uses a nebulizer for dyspnea and is on Breztri  twice daily. A new nebulizer medication, O2 Wear, may provide additional relief without causing tremors or palpitations, working through a different mechanism and administered twice daily. - Initiate approval process for Ohtuwayre nebulizer medication through [name]. - Continue Breztri  twice daily. - Provide educational material on Ohtuwayre  Chronic respiratory failure Chronic respiratory failure secondary to severe COPD. Oxygen  saturation is 92%. She experiences occasional peripheral edema, managed with Lasix  as needed based on weight and visual assessment of swelling. Monitor weight closely and use Lasix  if there is a sudden weight increase of three pounds or more. Purplish discoloration of feet and toes may indicate circulation issues, but no prior history of circulation problems. - Monitor weight and use Lasix  if  there is a sudden weight increase of three pounds or more. - Increase oxygen  flow to five liters and monitor for improvement in foot discoloration. - Elevate feet  when sitting.

## 2023-10-13 ENCOUNTER — Telehealth (HOSPITAL_BASED_OUTPATIENT_CLINIC_OR_DEPARTMENT_OTHER): Payer: Self-pay

## 2023-10-13 NOTE — Telephone Encounter (Signed)
 Documents will be faxed today   Copied from CRM [id number]. Topic: General - Other >> Oct 10, 2023  5:57 PM Eveleen Hinds B wrote: Reason for CRM:  Larinda Plover from [facility] following up on signed script and office notes. Not received. I verified fax number and it is incorrect.   Correct Fax: [phone]

## 2023-10-16 ENCOUNTER — Telehealth: Payer: Self-pay

## 2023-10-16 NOTE — Telephone Encounter (Signed)
 Received [name] new start paperwork. Completed form and faxed with clinicals and insurance card copy to [facility]   Phone#: [phone] Fax#: [phone]

## 2023-10-17 ENCOUNTER — Other Ambulatory Visit: Payer: Self-pay | Admitting: Family

## 2023-10-17 ENCOUNTER — Other Ambulatory Visit: Payer: Self-pay | Admitting: Internal Medicine

## 2023-10-26 ENCOUNTER — Other Ambulatory Visit (HOSPITAL_BASED_OUTPATIENT_CLINIC_OR_DEPARTMENT_OTHER): Payer: Self-pay | Admitting: Pulmonary Disease

## 2023-10-27 DIAGNOSIS — D2271 Melanocytic nevi of right lower limb, including hip: Secondary | ICD-10-CM | POA: Diagnosis not present

## 2023-10-27 DIAGNOSIS — L821 Other seborrheic keratosis: Secondary | ICD-10-CM | POA: Diagnosis not present

## 2023-10-27 DIAGNOSIS — L905 Scar conditions and fibrosis of skin: Secondary | ICD-10-CM | POA: Diagnosis not present

## 2023-10-27 DIAGNOSIS — D1801 Hemangioma of skin and subcutaneous tissue: Secondary | ICD-10-CM | POA: Diagnosis not present

## 2023-10-27 DIAGNOSIS — D2221 Melanocytic nevi of right ear and external auricular canal: Secondary | ICD-10-CM | POA: Diagnosis not present

## 2023-10-27 DIAGNOSIS — L820 Inflamed seborrheic keratosis: Secondary | ICD-10-CM | POA: Diagnosis not present

## 2023-10-27 DIAGNOSIS — Z85828 Personal history of other malignant neoplasm of skin: Secondary | ICD-10-CM | POA: Diagnosis not present

## 2023-10-27 DIAGNOSIS — L720 Epidermal cyst: Secondary | ICD-10-CM | POA: Diagnosis not present

## 2023-10-29 ENCOUNTER — Telehealth: Payer: Self-pay | Admitting: Acute Care

## 2023-10-29 ENCOUNTER — Other Ambulatory Visit: Payer: Self-pay

## 2023-10-29 DIAGNOSIS — Z87891 Personal history of nicotine dependence: Secondary | ICD-10-CM

## 2023-10-29 DIAGNOSIS — Z122 Encounter for screening for malignant neoplasm of respiratory organs: Secondary | ICD-10-CM

## 2023-10-29 NOTE — Telephone Encounter (Signed)
 Lung Cancer Screening Narrative/Criteria Questionnaire (Cigarette Smokers Only- No Cigars/Pipes/vapes)   Rebecca Mcintyre   SDMV:11/07/23 at [facility]                                           1946-03-07              LDCT: 11/17/23 at 12noon/[facility]    78 y.o.   Phone: [phone]  Lung Screening Narrative (confirm age 30-77 yrs Medicare / 50-80 yrs Private pay insurance)   Insurance information:[employer]   Referring Provider:Panosh   This screening involves an initial phone call with a team member from our program. It is called a shared decision making visit. The initial meeting is required by insurance and Medicare to make sure you understand the program. This appointment takes about 15-20 minutes to complete. The CT scan will completed at a separate date/time. This scan takes about 5-10 minutes to complete and you may eat and drink before and after the scan.  Criteria questions for Lung Cancer Screening:   Are you a current or former smoker? Former Age began smoking: 19y   If you are a former smoker, what year did you quit smoking? Quit last in 2020 (total of 11 yrs)   To calculate your smoking history, I need an accurate estimate of how many packs of cigarettes you smoked per day and for how many years. (Not just the number of PPD you are now smoking)   Years smoking 48 x Packs per day 1 = Pack years 48   (at least 20 pack yrs)   (Make sure they understand that we need to know how much they have smoked in the past, not just the number of PPD they are smoking now)  Do you have a personal history of cancer?  No    Do you have a family history of cancer? YES - mother/breast and brother/colon  Are you coughing up blood?  No  Have you had unexplained weight loss of 15 lbs or more in the last 6 months? No  It looks like you meet all criteria.     Additional information: N/A

## 2023-11-07 ENCOUNTER — Encounter: Admitting: Acute Care

## 2023-11-11 ENCOUNTER — Ambulatory Visit (HOSPITAL_BASED_OUTPATIENT_CLINIC_OR_DEPARTMENT_OTHER)

## 2023-11-13 ENCOUNTER — Encounter: Payer: Self-pay | Admitting: *Deleted

## 2023-11-13 ENCOUNTER — Other Ambulatory Visit (HOSPITAL_BASED_OUTPATIENT_CLINIC_OR_DEPARTMENT_OTHER): Payer: Self-pay | Admitting: Pulmonary Disease

## 2023-11-13 ENCOUNTER — Ambulatory Visit: Admitting: *Deleted

## 2023-11-13 DIAGNOSIS — Z87891 Personal history of nicotine dependence: Secondary | ICD-10-CM | POA: Diagnosis not present

## 2023-11-13 DIAGNOSIS — J441 Chronic obstructive pulmonary disease with (acute) exacerbation: Secondary | ICD-10-CM

## 2023-11-13 NOTE — Patient Instructions (Signed)

## 2023-11-13 NOTE — Progress Notes (Signed)
  Virtual Visit via Video Note  I connected with ADAIJAH ENDRES on 11/13/23 at  8:30 AM EDT by a video enabled telemedicine application and verified that I am speaking with the correct person using two identifiers.  Location: Patient: in home Provider: [address], [place], [place], [address]  Shared Decision Making Visit Lung Cancer Screening Program [phone])   Eligibility: Age 78 y.o. Pack Years Smoking History Calculation 48 (# packs/per year x # years smoked) Recent History of coughing up blood  no Unexplained weight loss? no ( >Than 15 pounds within the last 6 months ) Prior History Lung / other cancer no (Diagnosis within the last 5 years already requiring surveillance chest CT Scans). Smoking Status Former Smoker Former Smokers: Years since quit:5 years  Quit Date: 11-04-18  Visit Components: Discussion included one or more decision making aids. yes Discussion included risk/benefits of screening. yes Discussion included potential follow up diagnostic testing for abnormal scans. yes Discussion included meaning and risk of over diagnosis. yes Discussion included meaning and risk of False Positives. yes Discussion included meaning of total radiation exposure. yes  Counseling Included: Importance of adherence to annual lung cancer LDCT screening. yes Impact of comorbidities on ability to participate in the program. yes Ability and willingness to under diagnostic treatment. yes  Smoking Cessation Counseling: Current Smokers:  Discussed importance of smoking cessation. yes Information about tobacco cessation classes and interventions provided to patient. yes Patient provided with ticket for LDCT Scan. yes Symptomatic Patient. NO  Counseling NA Diagnosis Code: Tobacco Use Z72.0 Asymptomatic Patient yes  Counseling (Intermediate counseling: > three minutes counseling) [id number] Former Smokers:  Discussed the importance of maintaining cigarette abstinence.  yes Diagnosis Code: Personal History of Nicotine  Dependence. X91.478 Information about tobacco cessation classes and interventions provided to patient. Yes Patient provided with ticket for LDCT Scan. yes Written Order for Lung Cancer Screening with LDCT placed in Epic. Yes (CT Chest Lung Cancer Screening Low Dose W/O CM) [id number] Z12.2-Screening of respiratory organs Z87.891-Personal history of nicotine  dependence   Valentin Gaskins, RN 11/13/23

## 2023-11-14 NOTE — Telephone Encounter (Signed)
 [name]  refill

## 2023-11-14 NOTE — Telephone Encounter (Signed)
 Refill sent for [name]  to [facility]  Dose: 3mg  inhaled via nebs twice daily  Last OV: 10/09/2023 Provider: Dr. Villa Greaser  Next OV: 01/05/2024  Geraldene Kleine, PharmD, MPH, BCPS Clinical Pharmacist (Rheumatology and Pulmonology)

## 2023-11-17 ENCOUNTER — Ambulatory Visit (HOSPITAL_BASED_OUTPATIENT_CLINIC_OR_DEPARTMENT_OTHER)
Admission: RE | Admit: 2023-11-17 | Discharge: 2023-11-17 | Disposition: A | Discharge status: Home / Self Care | Source: Ambulatory Visit | Attending: Acute Care | Admitting: Acute Care

## 2023-11-17 DIAGNOSIS — Z122 Encounter for screening for malignant neoplasm of respiratory organs: Secondary | ICD-10-CM | POA: Insufficient documentation

## 2023-11-17 DIAGNOSIS — Z87891 Personal history of nicotine dependence: Secondary | ICD-10-CM | POA: Diagnosis not present

## 2023-11-25 ENCOUNTER — Other Ambulatory Visit: Payer: Self-pay | Admitting: Internal Medicine

## 2023-11-25 ENCOUNTER — Encounter (HOSPITAL_BASED_OUTPATIENT_CLINIC_OR_DEPARTMENT_OTHER): Payer: Self-pay | Admitting: Pulmonary Disease

## 2023-11-25 ENCOUNTER — Encounter: Payer: Self-pay | Admitting: Cardiology

## 2023-11-25 ENCOUNTER — Ambulatory Visit: Admitting: Internal Medicine

## 2023-11-25 ENCOUNTER — Encounter: Payer: Self-pay | Admitting: Internal Medicine

## 2023-11-25 VITALS — BP 116/64 | HR 75 | Temp 97.4°F | Ht 63.0 in | Wt 222.4 lb

## 2023-11-25 DIAGNOSIS — I5032 Chronic diastolic (congestive) heart failure: Secondary | ICD-10-CM | POA: Diagnosis not present

## 2023-11-25 DIAGNOSIS — J449 Chronic obstructive pulmonary disease, unspecified: Secondary | ICD-10-CM

## 2023-11-25 DIAGNOSIS — E559 Vitamin D deficiency, unspecified: Secondary | ICD-10-CM

## 2023-11-25 DIAGNOSIS — E119 Type 2 diabetes mellitus without complications: Secondary | ICD-10-CM | POA: Diagnosis not present

## 2023-11-25 DIAGNOSIS — I2510 Atherosclerotic heart disease of native coronary artery without angina pectoris: Secondary | ICD-10-CM

## 2023-11-25 DIAGNOSIS — Z79899 Other long term (current) drug therapy: Secondary | ICD-10-CM

## 2023-11-25 DIAGNOSIS — Z8739 Personal history of other diseases of the musculoskeletal system and connective tissue: Secondary | ICD-10-CM | POA: Diagnosis not present

## 2023-11-25 DIAGNOSIS — I10 Essential (primary) hypertension: Secondary | ICD-10-CM | POA: Diagnosis not present

## 2023-11-25 DIAGNOSIS — Z9981 Dependence on supplemental oxygen: Secondary | ICD-10-CM | POA: Diagnosis not present

## 2023-11-25 DIAGNOSIS — E785 Hyperlipidemia, unspecified: Secondary | ICD-10-CM

## 2023-11-25 DIAGNOSIS — G2581 Restless legs syndrome: Secondary | ICD-10-CM

## 2023-11-25 LAB — POCT GLYCOSYLATED HEMOGLOBIN (HGB A1C): Hemoglobin A1C: 6.2 % — AB (ref 4.0–5.6)

## 2023-11-25 NOTE — Telephone Encounter (Signed)
**Note De-identified  [place] Obfuscation** Please advise 

## 2023-11-25 NOTE — Progress Notes (Signed)
 Labs to be done before cpe in December 25

## 2023-11-25 NOTE — Progress Notes (Signed)
 Chief Complaint  Patient presents with   Medical Management of Chronic Issues    Pt is here for 6 months f/u. Pt is with daughter, [name]. Daughter reports would like to go over lung scan.    HPI: Rebecca Mcintyre 78 y.o. come in for Chronic disease management  here with daughter   Vision ok    no cataracts  Hearing  dr  jesus  put in tube   on right   hearing  has improved.  Resp on updated o2 support  5 l adjusted and feeling much better  No new cv sx  No  fall  NO gout attacks on allopurinol   RTLS  better on requip ?   ROS: See pertinent positives and negatives per HPI.  Past Medical History:  Diagnosis Date   Acute on chronic respiratory failure with hypoxia and hypercapnia (HCC) 11/05/2018   Allergy    ASCUS on Pap smear    had repeat x 2 normal with Dr. Estelle 2011   Coronary artery disease    Gout    Hyperlipidemia    Hypertension    Myocardial infarction [facility])    2002 95% proximal LAD stenosis with thrombus followed by 90% stenosis, the first diagonal 80% stenosis, circumflex 30% stenosis, circumflex obtuse marginal subbranch 70% stenosis, PDA 40% stenosis. She had a drug-eluting stent placement with a Pixel stent   Osteopenia    Solitary kidney     Family History  Problem Relation Age of Onset   Breast cancer Mother 41   Lung cancer Father    Heart attack Brother        Died age 59 MI   Colon cancer Brother    Heart disease Daughter 38       smoker and overweight    Esophageal cancer Neg Hx    Pancreatic cancer Neg Hx    Rectal cancer Neg Hx    Stomach cancer Neg Hx     Social History   Socioeconomic History   Marital status: Single    Spouse name: Not on file   Number of children: 2   Years of education: 14   Highest education level: Some college, no degree  Occupational History   Occupation: semi retired    Associate Professor: Scientist, research (life sciences)  Tobacco Use   Smoking status: Former    Current packs/day: 0.00    Average packs/day: 1 pack/day for 39.0 years  (39.0 ttl pk-yrs)    Types: Cigarettes    Start date: 11/04/1979    Quit date: 11/04/2018    Years since quitting: 5.0   Smokeless tobacco: Never  Vaping Use   Vaping status: Never Used  Substance and Sexual Activity   Alcohol use: Yes    Comment: 1-2 per month   Drug use: No   Sexual activity: Not Currently    Partners: Male    Comment: single  Other Topics Concern   Not on file  Social History Narrative   Lives alone   Left Handed   Drinks 1-2 cups caffeine daily      Enjoys company of 2 cats, watching TV and movies, read on her Ipad, social media   Social Drivers of Health   Financial Resource Strain: Low Risk  (11/21/2023)   Overall Financial Resource Strain (CARDIA)    Difficulty of Paying Living Expenses: Not hard at all  Food Insecurity: No Food Insecurity (11/21/2023)   Hunger Vital Sign    Worried About Running Out of Food in the  Last Year: Never true    Ran Out of Food in the Last Year: Never true  Transportation Needs: No Transportation Needs (11/21/2023)   PRAPARE - Administrator, Civil Service (Medical): No    Lack of Transportation (Non-Medical): No  Physical Activity: Inactive (11/21/2023)   Exercise Vital Sign    Days of Exercise per Week: 0 days    Minutes of Exercise per Session: Not on file  Stress: No Stress Concern Present (11/21/2023)   [employer] of Occupational Health - Occupational Stress Questionnaire    Feeling of Stress: Not at all  Social Connections: Socially Isolated (11/21/2023)   Social Connection and Isolation Panel    Frequency of Communication with Friends and Family: More than three times a week    Frequency of Social Gatherings with Friends and Family: More than three times a week    Attends Religious Services: Patient declined    Database administrator or Organizations: No    Attends Engineer, structural: Not on file    Marital Status: Divorced    Outpatient Medications Prior to Visit  Medication Sig  Dispense Refill   ACCU-CHEK GUIDE test strip USE AS DIRECTED TWICE A DAY 100 strip 3   Accu-Chek Softclix Lancets lancets Use as instructed twice a day Diagnosis E11.9 100 each 3   acetaminophen  (TYLENOL ) 500 MG tablet Take 1,000 mg by mouth every 6 (six) hours as needed for mild pain.     albuterol  (PROVENTIL ) (2.5 MG/3ML) 0.083% nebulizer solution Take 3 mLs (2.5 mg total) by nebulization every 6 (six) hours as needed for wheezing or shortness of breath. 300 mL 11   albuterol  (VENTOLIN  HFA) 108 (90 Base) MCG/ACT inhaler TAKE 2 PUFFS BY MOUTH EVERY 6 HOURS AS NEEDED FOR WHEEZE OR SHORTNESS OF BREATH 8.5 each 1   allopurinol  (ZYLOPRIM ) 100 MG tablet Take 1 tablet (100 mg total) by mouth 2 (two) times daily. 180 tablet 1   aspirin  81 MG tablet Take 81 mg by mouth daily.     Blood Glucose Monitoring Suppl (ACCU-CHEK GUIDE) w/Device KIT Use as directed twice a day Diagnosis E11.9 1 kit 2   Budeson-Glycopyrrol-Formoterol  (BREZTRI  AEROSPHERE) 160-9-4.8 MCG/ACT AERO Inhale 2 puffs into the lungs 2 (two) times daily. 3 each 3   Ensifentrine  ([name] ) 3 MG/2.5ML SUSP Inhale 3 mg into the lungs in the morning and at bedtime. DO NOT MIX WITH OTHER INHALED MEDS. Dx: J44.9 450 mL 1   FARXIGA  10 MG TABS tablet TAKE 1 TABLET BY MOUTH EVERY DAY 90 tablet 2   furosemide  (LASIX ) 40 MG tablet TAKE 1 TABLET BY MOUTH EVERY DAY 90 tablet 1   guaiFENesin  (MUCINEX ) 600 MG 12 hr tablet Take 2 tablets (1,200 mg total) by mouth 2 (two) times daily. 60 tablet 0   metFORMIN  (GLUCOPHAGE ) 500 MG tablet Take 1 tablet (500 mg total) by mouth in the morning and at bedtime. 180 tablet 3   metoprolol  tartrate (LOPRESSOR ) 25 MG tablet TAKE 0.5 TABLETS BY MOUTH 2 TIMES DAILY. 90 tablet 1   nitroGLYCERIN  (NITROSTAT ) 0.4 MG SL tablet Place 1 tablet (0.4 mg total) under the tongue every 5 (five) minutes as needed for chest pain. 25 tablet 3   Polyethylene Glycol 400 (VISINE DRY EYE RELIEF OP) Place 1 drop into both eyes daily as needed  (dry eyes).     potassium chloride  (KLOR-CON  M10) 10 MEQ tablet Take 1 tablet (10 mEq total) by mouth 2 (two) times daily. 180 tablet  0   rOPINIRole  (REQUIP ) 0.5 MG tablet TAKE 1 TABLET AT BEDTIME FOR RESTLESS LEG 90 tablet 1   rosuvastatin  (CRESTOR ) 20 MG tablet TAKE 1 TABLET BY MOUTH EVERY DAY 90 tablet 3   tirzepatide  (MOUNJARO ) 2.5 MG/0.5ML Pen Inject 2.5 mg into the skin once a week. 2 mL 0   calcium  carbonate (TUMS EX) 750 MG chewable tablet Chew 750 mg by mouth daily as needed for heartburn.     doxycycline  (VIBRA -TABS) 100 MG tablet Take 1 tablet (100 mg total) by mouth 2 (two) times daily. 14 tablet 0   Vitamin D , Ergocalciferol , (DRISDOL ) 1.25 MG (50000 UNIT) CAPS capsule TAKE 1 CAPSULE (50,000 UNITS TOTAL) BY MOUTH EVERY 7 (SEVEN) DAYS 8 capsule 0   No facility-administered medications prior to visit.     EXAM:  BP 116/64   Pulse 75   Temp (!) 97.4 F (36.3 C)   Ht 5' 3 (1.6 m)   Wt 222 lb 6.4 oz (100.9 kg)   SpO2 97%   BMI 39.40 kg/m   Body mass index is 39.4 kg/m.  GENERAL: vitals reviewed and listed above, alert, oriented, appears well hydrated and in no acute distress  speech nl  at rest on o2 and good color  independent ambulatory HEENT: atraumatic, conjunctiva  clear, no obvious abnormalities on inspection of external nose and ears  NECK: no obvious masses on inspection palpation  LUNGS: clear to auscultation bilaterally, no wheezes, rales or rhonchi,dec BS pos accessory m  use CV: HRRR, no clubbing cyanosis  sloght   peripheral edema  MS: moves all extremities without noticeable focal  abnormality PSYCH: pleasant and cooperative, no obvious depression or anxiety Lab Results  Component Value Date   WBC 10.0 02/06/2023   HGB 11.9 02/06/2023   HCT 38.3 02/06/2023   PLT 367 02/06/2023   GLUCOSE 96 05/14/2023   CHOL 122 05/14/2023   TRIG 129.0 05/14/2023   HDL 46.10 05/14/2023   LDLDIRECT 116.0 02/05/2019   LDLCALC 51 05/14/2023   ALT 12 05/14/2023   AST  16 05/14/2023   NA 140 05/14/2023   K 4.1 05/14/2023   CL 103 05/14/2023   CREATININE 1.05 05/14/2023   BUN 25 (H) 05/14/2023   CO2 30 05/14/2023   TSH 2.24 05/14/2023   INR 1.0 11/28/2020   HGBA1C 6.2 (A) 11/25/2023   BP Readings from Last 3 Encounters:  11/25/23 116/64  10/09/23 (!) 144/60  07/29/23 119/70  Last vitamin D  Lab Results  Component Value Date   VD25OH 48.76 09/08/2023   Reveiwed  ct scan  and last echo w patient and daughter    ASSESSMENT AND PLAN:  Discussed the following assessment and plan:  Diabetes mellitus type II, non insulin  dependent (HCC) - Plan: POC HgB A1c  Essential hypertension  Coronary artery disease involving native coronary artery of native heart without angina pectoris  Medication management  History of gout  Chronic obstructive pulmonary disease, unspecified COPD type (HCC)  Oxygen  dependent  Chronic diastolic congestive heart failure (HCC) Seems that quality of life much better  on current o2 supplementaion and    No gout attacks and feels requip  very helpful for RLS  Dm is controlled  Update labs in dec and cpe or as indicated and include  urine protein screen.  Immuniz should be utd. Vit d levels ok last check and taking daily  Has only one kidney  -Patient advised to return or notify health care team  if  new concerns arise.  Patient Instructions  Good to see you today   Plan  fasting lab  and then cpe in December 2025      Loman Logan K. Laquita Harlan M.D.

## 2023-11-25 NOTE — Patient Instructions (Signed)
 Good to see you today   Plan  fasting lab  and then cpe in December 2025

## 2023-12-02 ENCOUNTER — Encounter (HOSPITAL_BASED_OUTPATIENT_CLINIC_OR_DEPARTMENT_OTHER): Payer: Self-pay | Admitting: Pulmonary Disease

## 2023-12-02 NOTE — Telephone Encounter (Signed)
**Note De-identified  [place] Obfuscation** Please advise 

## 2023-12-05 ENCOUNTER — Other Ambulatory Visit: Payer: Self-pay | Admitting: Internal Medicine

## 2023-12-16 ENCOUNTER — Telehealth: Payer: Self-pay | Admitting: Pharmacist

## 2023-12-16 ENCOUNTER — Other Ambulatory Visit (HOSPITAL_COMMUNITY): Payer: Self-pay

## 2023-12-16 ENCOUNTER — Other Ambulatory Visit: Payer: Self-pay

## 2023-12-16 DIAGNOSIS — E118 Type 2 diabetes mellitus with unspecified complications: Secondary | ICD-10-CM

## 2023-12-16 MED ORDER — MOUNJARO 5 MG/0.5ML ~~LOC~~ SOAJ
5.0000 mg | SUBCUTANEOUS | 0 refills | Status: DC
  Filled 2023-12-16: qty 2, 28d supply, fill #0

## 2023-12-16 NOTE — Telephone Encounter (Signed)
 Per patient's daughter, needs next dose of Mounjaro 

## 2023-12-23 ENCOUNTER — Encounter (HOSPITAL_BASED_OUTPATIENT_CLINIC_OR_DEPARTMENT_OTHER): Payer: Self-pay | Admitting: Pulmonary Disease

## 2023-12-24 ENCOUNTER — Encounter: Payer: Self-pay | Admitting: Cardiology

## 2024-01-05 ENCOUNTER — Ambulatory Visit (HOSPITAL_BASED_OUTPATIENT_CLINIC_OR_DEPARTMENT_OTHER): Admitting: Adult Health

## 2024-01-05 ENCOUNTER — Encounter (HOSPITAL_BASED_OUTPATIENT_CLINIC_OR_DEPARTMENT_OTHER): Payer: Self-pay | Admitting: Adult Health

## 2024-01-05 VITALS — BP 137/78 | HR 80 | Ht 63.0 in | Wt 216.4 lb

## 2024-01-05 DIAGNOSIS — J9611 Chronic respiratory failure with hypoxia: Secondary | ICD-10-CM

## 2024-01-05 DIAGNOSIS — J449 Chronic obstructive pulmonary disease, unspecified: Secondary | ICD-10-CM

## 2024-01-05 DIAGNOSIS — Z23 Encounter for immunization: Secondary | ICD-10-CM | POA: Diagnosis not present

## 2024-01-05 MED ORDER — BREZTRI AEROSPHERE 160-9-4.8 MCG/ACT IN AERO: 2.0000 | INHALATION_SPRAY | Freq: Two times a day (BID) | RESPIRATORY_TRACT | 3 refills | Status: AC

## 2024-01-05 NOTE — Patient Instructions (Addendum)
 Continue on Breztri  2 puffs Twice daily, rinse after use  Continue on [name]  Neb Twice daily   Albuterol  inhaler or neb As needed   Prevnar 20 vaccine today  Continue on Oxygen  4-5l/m  Continue on BIPAP At bedtime   BIPAP download  Follow up with Dr. Jude  in 3 months and As needed

## 2024-01-05 NOTE — Progress Notes (Signed)
 @Patient  ID: Shawnee GORMAN Presume, female    DOB: Mar 29, 1946, 78 y.o.   MRN: [id number]  Chief Complaint  Patient presents with   Follow-up    COPD    Referring provider: Charlett Apolinar POUR, MD  HPI: 78 year old female former smoker followed for COPD, chronic hypoxic and hypercarbic respiratory failure and pulmonary hypertension (Not a candidate for the [facility]) Medical history significant for coronary artery disease, diastolic heart failure  TEST/EVENTS :  hospitalized 10/2018 for acute on chronic hypercarbic respiratory failure -slow to resolve COPD exacerbation 01/2020 - hospitalized 11/2020  for right lower extremity injury, course complicated by acute diastolic heart failure and encephalopathy due to narcotics elevated troponin  -hosp 04/2022 for flare   ABG 05/2022 7.36/49/69/93% ABG 01/2022 7.4 7/57/65 on 3 L oxygen  ABG 02/2020 7.4 2/44/53/87%     PFTs 05/2022 FEV1 0.73 /37%-improved to 0.89 postbronchodilator , DLCO 3.43   PFTs 12/29/2018 FVC 2.31 (82), FEV1 1.20 (56), ratio 52, no bronchodilatory response, DLCO 51 %   10/2018 CTA Chest- No PE; cardiomegaly, emphysema       NPSG 12/13/18  no obstructive sleep apnea, min O2 89%. Periodic limb movements did occur during sleep    01/05/2024 Follow up : COPD, O2 RF  Discussed the use of AI scribe software for clinical note transcription with the patient, who gave verbal consent to proceed.  History of Present Illness CASSI JENNE is a 78 year old female with COPD and emphysema who presents for a 3 month checkup.  She started using [name]  in her nebulizer a few months ago, noting significant improvement in her breathing and relief from occasional shortness of breath. She receives the medication through [facility] with financial assistance due to its high cost.  She continues to use albuterol  as needed, both in nebulizer form and as an inhaler, and uses Breztri  twice a day. She needs a refill for Breztri . Oxygen  therapy is  used at home, typically at four liters on continuous flow, increasing to five liters when walking. She uses a NIV auto iVAPS  machine at night,. Download shows excellent usage at 9hr each night. (iVAPS, EPAP 4 , PS 6-18, RR 15  A LDCT scan was performed in June as part of her yearly screening showed no suspicious nodules , stable emphysema   She takes Lasix  as needed, usually at least twice a week. She had thrush at her last dental checkup, which has since resolved.     Allergies  Allergen Reactions   Atorvastatin     REACTION: myalgias  and liver    Immunization History  Administered Date(s) Administered   Fluad Quad(high Dose 65+) 02/11/2019, 02/29/2020, 04/10/2021, 03/21/2022   Influenza Split 04/29/2011, 04/08/2012   Influenza Whole 02/16/2008, 03/15/2009, 03/20/2010   Influenza, High Dose Seasonal PF 04/14/2013, 02/14/2015, 02/28/2016, 03/10/2017, 03/25/2018   Influenza, Seasonal, Injecte, Preservative Fre 02/06/2023   Influenza,inj,Quad PF,6+ Mos 04/18/2014   Influenza,inj,quad, With Preservative 02/25/2019   Influenza-Unspecified 02/24/2017   Moderna Covid-19 Fall Seasonal Vaccine 19yrs & older 05/07/2022   PFIZER(Purple Top)SARS-COV-2 Vaccination 06/15/2019, 07/06/2019, 04/13/2020   Pneumococcal Conjugate-13 04/08/2012   Pneumococcal Polysaccharide-23 04/14/2013   Respiratory Syncytial Virus Vaccine,Recomb Aduvanted(Arexvy) 05/07/2022   Td 09/14/2007   Unspecified SARS-COV-2 Vaccination 04/25/2021   Zoster Recombinant(Shingrix) 06/30/2021, 10/12/2021   Zoster, Live 09/14/2007    Past Medical History:  Diagnosis Date   Acute on chronic respiratory failure with hypoxia and hypercapnia (HCC) 11/05/2018   Allergy    ASCUS on Pap smear  had repeat x 2 normal with Dr. Estelle 2011   Coronary artery disease    Gout    Hyperlipidemia    Hypertension    Myocardial infarction [facility])    2002 95% proximal LAD stenosis with thrombus followed by 90% stenosis, the first  diagonal 80% stenosis, circumflex 30% stenosis, circumflex obtuse marginal subbranch 70% stenosis, PDA 40% stenosis. She had a drug-eluting stent placement with a Pixel stent   Osteopenia    Solitary kidney     Tobacco History: Social History   Tobacco Use  Smoking Status Former   Current packs/day: 0.00   Average packs/day: 1 pack/day for 39.0 years (39.0 ttl pk-yrs)   Types: Cigarettes   Start date: 11/04/1979   Quit date: 11/04/2018   Years since quitting: 5.1  Smokeless Tobacco Never   Counseling given: Not Answered   Outpatient Medications Prior to Visit  Medication Sig Dispense Refill   ACCU-CHEK GUIDE test strip USE AS DIRECTED TWICE A DAY 100 strip 3   Accu-Chek Softclix Lancets lancets Use as instructed twice a day Diagnosis E11.9 100 each 3   acetaminophen  (TYLENOL ) 500 MG tablet Take 1,000 mg by mouth every 6 (six) hours as needed for mild pain.     albuterol  (PROVENTIL ) (2.5 MG/3ML) 0.083% nebulizer solution Take 3 mLs (2.5 mg total) by nebulization every 6 (six) hours as needed for wheezing or shortness of breath. 300 mL 11   albuterol  (VENTOLIN  HFA) 108 (90 Base) MCG/ACT inhaler TAKE 2 PUFFS BY MOUTH EVERY 6 HOURS AS NEEDED FOR WHEEZE OR SHORTNESS OF BREATH 8.5 each 1   allopurinol  (ZYLOPRIM ) 100 MG tablet Take 1 tablet (100 mg total) by mouth 2 (two) times daily. 180 tablet 1   aspirin  81 MG tablet Take 81 mg by mouth daily.     Blood Glucose Monitoring Suppl (ACCU-CHEK GUIDE) w/Device KIT Use as directed twice a day Diagnosis E11.9 1 kit 2   Budeson-Glycopyrrol-Formoterol  (BREZTRI  AEROSPHERE) 160-9-4.8 MCG/ACT AERO Inhale 2 puffs into the lungs 2 (two) times daily. 3 each 3   Ensifentrine  ([name] ) 3 MG/2.5ML SUSP Inhale 3 mg into the lungs in the morning and at bedtime. DO NOT MIX WITH OTHER INHALED MEDS. Dx: J44.9 450 mL 1   FARXIGA  10 MG TABS tablet TAKE 1 TABLET BY MOUTH EVERY DAY 90 tablet 2   furosemide  (LASIX ) 40 MG tablet TAKE 1 TABLET BY MOUTH EVERY DAY 90  tablet 1   guaiFENesin  (MUCINEX ) 600 MG 12 hr tablet Take 2 tablets (1,200 mg total) by mouth 2 (two) times daily. 60 tablet 0   metFORMIN  (GLUCOPHAGE ) 500 MG tablet Take 1 tablet (500 mg total) by mouth in the morning and at bedtime. 180 tablet 3   metoprolol  tartrate (LOPRESSOR ) 25 MG tablet TAKE 0.5 TABLETS BY MOUTH 2 TIMES DAILY. 90 tablet 1   nitroGLYCERIN  (NITROSTAT ) 0.4 MG SL tablet Place 1 tablet (0.4 mg total) under the tongue every 5 (five) minutes as needed for chest pain. 25 tablet 3   Polyethylene Glycol 400 (VISINE DRY EYE RELIEF OP) Place 1 drop into both eyes daily as needed (dry eyes).     potassium chloride  (KLOR-CON  M10) 10 MEQ tablet Take 1 tablet (10 mEq total) by mouth 2 (two) times daily. 180 tablet 0   rOPINIRole  (REQUIP ) 0.5 MG tablet TAKE 1 TABLET AT BEDTIME FOR RESTLESS LEG 90 tablet 1   rosuvastatin  (CRESTOR ) 20 MG tablet TAKE 1 TABLET BY MOUTH EVERY DAY 90 tablet 3   tirzepatide  (MOUNJARO ) 5  MG/0.5ML Pen Inject 5 mg into the skin once a week. 2 mL 0   Ensifentrine  ([name] ) 3 MG/2.5ML SUSP Inhale 1 vial into the lungs in the morning and at bedtime.     No facility-administered medications prior to visit.     Review of Systems:   Constitutional:   No  weight loss, night sweats,  Fevers, chills, +fatigue, or  lassitude.  HEENT:   No headaches,  Difficulty swallowing,  Tooth/dental problems, or  Sore throat,                No sneezing, itching, ear ache, nasal congestion, post nasal drip,   CV:  No chest pain,  Orthopnea, PND, swelling in lower extremities, anasarca, dizziness, palpitations, syncope.   GI  No heartburn, indigestion, abdominal pain, nausea, vomiting, diarrhea, change in bowel habits, loss of appetite, bloody stools.   Resp:   No wheezing.  No chest wall deformity  Skin: no rash or lesions.  GU: no dysuria, change in color of urine, no urgency or frequency.  No flank pain, no hematuria   MS:  No joint pain or swelling.  No decreased range of  motion.  No back pain.    Physical Exam  BP 137/78   Pulse 80   Ht 5' 3 (1.6 m)   Wt 216 lb 6.4 oz (98.2 kg)   SpO2 95%   BMI 38.33 kg/m   GEN: A/Ox3; pleasant , NAD, on O2    HEENT:  [place]/AT,  EACs-clear, TMs-wnl, NOSE-clear, THROAT-clear, no lesions, no postnasal drip or exudate noted.   NECK:  Supple w/ fair ROM; no JVD; normal carotid impulses w/o bruits; no thyromegaly or nodules palpated; no lymphadenopathy.    RESP  Clear  P & A; w/o, wheezes/ rales/ or rhonchi. no accessory muscle use, no dullness to percussion  CARD:  RRR, no m/r/g, no peripheral edema, pulses intact, no cyanosis or clubbing. Imaging: No results found.  Administration History     None          Latest Ref Rng & Units 12/29/2018   11:55 AM  PFT Results  FVC-Pre L 2.28  P  FVC-Predicted Pre % 81  P  FVC-Post L 2.31  P  FVC-Predicted Post % 82  P  Pre FEV1/FVC % % 51  P  Post FEV1/FCV % % 52  P  FEV1-Pre L 1.17  P  FEV1-Predicted Pre % 55  P  FEV1-Post L 1.20  P  DLCO uncorrected ml/min/mmHg 9.62  P  DLCO UNC% % 51  P  DLVA Predicted % 67  P  TLC L 5.11  P  TLC % Predicted % 104  P  RV % Predicted % 128  P    P Preliminary result    No results found for: NITRICOXIDE      Assessment & Plan:   No problem-specific Assessment & Plan notes found for this encounter.  Assessment and Plan Assessment & Plan Chronic obstructive pulmonary disease (COPD) with emphysema  She reports symptom improvement with [name]  via nebulizer and experiences less dyspnea. Albuterol  is used as needed, and Breztri  is taken twice daily.  Low-dose CT chest November 17, 2023 showed stable emphysema with no suspicious nodules.  Pneumonia vaccine-Prevnar 20 booster today.  Continue on Breztri  twice daily.  Continue on [name]  neb Twice daily  .  Albuterol  inhaler or nebulizer as needed  Chronic hypoxic and hypercarbic respiratory failure.  Home oxygen  therapy is maintained at 4 liters continuous flow,  increasing to 5 liters when ambulating.  Continue on noninvasive vent at bedtime.  Compliance report shows excellent usage.    Physical deconditioning activity as able.  Plan  Patient Instructions  Continue on Breztri  2 puffs Twice daily, rinse after use  Continue on [name]  Neb Twice daily   Albuterol  inhaler or neb As needed   Prevnar 20 vaccine today  Continue on Oxygen  4-5l/m  Continue on BIPAP At bedtime   BIPAP download  Follow up with Dr. Jude  in 3 months and As needed          Madelin Stank, NP 01/05/2024

## 2024-01-12 ENCOUNTER — Other Ambulatory Visit: Payer: Self-pay

## 2024-01-12 ENCOUNTER — Other Ambulatory Visit (HOSPITAL_COMMUNITY): Payer: Self-pay

## 2024-01-12 MED ORDER — MOUNJARO 7.5 MG/0.5ML ~~LOC~~ SOAJ
7.5000 mg | SUBCUTANEOUS | 0 refills | Status: DC
  Filled 2024-01-12: qty 2, 28d supply, fill #0

## 2024-01-12 NOTE — Addendum Note (Signed)
 Addended by: Emalie Mcwethy D on: 01/12/2024 03:55 PM   Modules accepted: Orders

## 2024-01-21 ENCOUNTER — Other Ambulatory Visit (HOSPITAL_BASED_OUTPATIENT_CLINIC_OR_DEPARTMENT_OTHER): Payer: Self-pay

## 2024-01-21 ENCOUNTER — Encounter (HOSPITAL_BASED_OUTPATIENT_CLINIC_OR_DEPARTMENT_OTHER): Payer: Self-pay | Admitting: Pulmonary Disease

## 2024-01-21 ENCOUNTER — Other Ambulatory Visit: Payer: Self-pay | Admitting: Family

## 2024-01-21 MED ORDER — POTASSIUM CHLORIDE CRYS ER 10 MEQ PO TBCR: 10.0000 meq | EXTENDED_RELEASE_TABLET | Freq: Two times a day (BID) | ORAL | 0 refills | Status: AC

## 2024-01-27 NOTE — Progress Notes (Unsigned)
 Cardiology Office Note:   Date:  01/29/2024  ID:  Rebecca Mcintyre, DOB 04/02/1946, MRN [id number] PCP: Charlett Apolinar POUR, MD  [facility] Providers Cardiologist:  Lynwood Schilling, MD {  History of Present Illness:   Rebecca Mcintyre is a 78 y.o. female  who presents for evaluation of CAD. I saw her in 2014.  She has a history of an anterior MI in 2002.   She had DES to the LAD.    She was in the hospital after syncopal episode in June 2020.    She had acute hypoxic respiratory failure.  She had acute on chronic diastolic dysfunction.  She had acute cor pulmonale.  She did require some diuresis.  She had an echocardiogram which demonstrated well-preserved left ventricular function with some suggestion of diastolic dysfunction.  There was elevated pulmonary pressures noted.  She had moderate RV dysfunction.  She did have diuresis of 10.2 L.  She was sent home on 40 mg of Lasix .  She was also sent home on home O2 and pulmonary meds as listed.  She did have a follow-up echo.  Her pulmonary pressures seem to be reduced and RV function improved.  In July 2022 she was hospitalized with acute on chronic respiratory failure with acute on chronic diastolic HF.  She had acute encephalopathy thought to be related to narcotics.  During that hospitalization she had heart failure with preserved ejection fraction require diuresis.  She had an elevated troponin which was felt to be demand ischemia.  Echo demonstrated no new wall motion abnormalities.    She returns for follow up. She is doing well.  The patient denies any new symptoms such as chest discomfort, neck or arm discomfort. There has been no new shortness of breath, PND or orthopnea. There have been no reported palpitations, presyncope or syncope.   She is O2 dependent.  She sees her pulmonologist every 3 to 4 months.  She has done well since she last saw him.  She is back on Mounjaro .  She is breathing better.  She is only taking Lasix  as needed.  ROS:    Positive for nocturnal cramping.  Otherwise as stated in HPI negative for all other systems.  Studies Reviewed:    EKG:     NA    Risk Assessment/Calculations:              Physical Exam:   VS:  BP 130/80   Pulse 78   Ht 5' 3 (1.6 m)   Wt 213 lb (96.6 kg)   SpO2 91%   BMI 37.73 kg/m    Wt Readings from Last 3 Encounters:  01/29/24 213 lb (96.6 kg)  01/05/24 216 lb 6.4 oz (98.2 kg)  11/25/23 222 lb 6.4 oz (100.9 kg)     GEN: Well nourished, well developed in no acute distress NECK: No JVD; No carotid bruits CARDIAC: RRR, NO murmurs, rubs, gallops RESPIRATORY:  Clear to auscultation without rales, wheezing or rhonchi  ABDOMEN: Soft, non-tender, non-distended EXTREMITIES:  No edema; No deformity   ASSESSMENT AND PLAN:   CAD:  The patient has no new sypmtoms.  No further cardiovascular testing is indicated.  We will continue with aggressive risk reduction and meds as listed.   DYSLIPIDEMIA:     LDL was 51  HTN:  The blood pressure is at target.  Continue meds as listed.     DIABETES:  Her hemoglobin A1c was 6.2 which is down from previous.  CHRONIC DIASTOLIC DYSFUNCTION:   She seems to be euvolemic.  She will continue with as needed diuretics.  No change in therapy.  I will be drawing routine blood work as it has been a while.       Follow up with me in 1six months.   Signed, Lynwood Schilling, MD

## 2024-01-28 ENCOUNTER — Encounter: Payer: Self-pay | Admitting: Internal Medicine

## 2024-01-29 ENCOUNTER — Encounter: Payer: Self-pay | Admitting: Cardiology

## 2024-01-29 ENCOUNTER — Other Ambulatory Visit: Payer: Self-pay

## 2024-01-29 ENCOUNTER — Ambulatory Visit: Attending: Cardiology | Admitting: Cardiology

## 2024-01-29 VITALS — BP 130/80 | HR 78 | Ht 63.0 in | Wt 213.0 lb

## 2024-01-29 DIAGNOSIS — D582 Other hemoglobinopathies: Secondary | ICD-10-CM

## 2024-01-29 DIAGNOSIS — I10 Essential (primary) hypertension: Secondary | ICD-10-CM

## 2024-01-29 DIAGNOSIS — I509 Heart failure, unspecified: Secondary | ICD-10-CM | POA: Diagnosis not present

## 2024-01-29 DIAGNOSIS — E041 Nontoxic single thyroid nodule: Secondary | ICD-10-CM | POA: Diagnosis not present

## 2024-01-29 DIAGNOSIS — I5032 Chronic diastolic (congestive) heart failure: Secondary | ICD-10-CM

## 2024-01-29 DIAGNOSIS — E119 Type 2 diabetes mellitus without complications: Secondary | ICD-10-CM

## 2024-01-29 DIAGNOSIS — E785 Hyperlipidemia, unspecified: Secondary | ICD-10-CM | POA: Diagnosis not present

## 2024-01-29 DIAGNOSIS — I2510 Atherosclerotic heart disease of native coronary artery without angina pectoris: Secondary | ICD-10-CM | POA: Diagnosis not present

## 2024-01-29 LAB — LIPID PANEL

## 2024-01-29 NOTE — Patient Instructions (Addendum)
 Medication Instructions:  Your physician recommends that you continue on your current medications as directed. Please refer to the Current Medication list given to you today.  *If you need a refill on your cardiac medications before your next appointment, please call your pharmacy*  Lab Work: Vitamin B12, & D, uric acid, microalbumin, TSH, Lipid panel, Hepatic panel, A1c, CBC, BMET If you have labs (blood work) drawn today and your tests are completely normal, you will receive your results only by: MyChart Message (if you have MyChart) OR A paper copy in the mail If you have any lab test that is abnormal or we need to change your treatment, we will call you to review the results.  Testing/Procedures: NONE  Follow-Up: At [facility], you and your health needs are our priority.  As part of our continuing mission to provide you with exceptional heart care, our providers are all part of one team.  This team includes your primary Cardiologist (physician) and Advanced Practice Providers or APPs (Physician Assistants and Nurse Practitioners) who all work together to provide you with the care you need, when you need it.  Your next appointment:   6 month(s)  Provider:   Lynwood Schilling, MD    We recommend signing up for the patient portal called MyChart.  Sign up information is provided on this After Visit Summary.  MyChart is used to connect with patients for Virtual Visits (Telemedicine).  Patients are able to view lab/test results, encounter notes, upcoming appointments, etc.  Non-urgent messages can be sent to your provider as well.   To learn more about what you can do with MyChart, go to [email or web address].

## 2024-01-30 ENCOUNTER — Other Ambulatory Visit (HOSPITAL_BASED_OUTPATIENT_CLINIC_OR_DEPARTMENT_OTHER): Payer: Self-pay | Admitting: Pulmonary Disease

## 2024-01-30 LAB — MICROALBUMIN / CREATININE URINE RATIO
Creatinine, Urine: 83.5 mg/dL
Microalb/Creat Ratio: 182 mg/g{creat} — AB (ref 0–29)
Microalbumin, Urine: 151.7 ug/mL

## 2024-01-30 LAB — LIPID PANEL
Cholesterol, Total: 107 mg/dL (ref 100–199)
HDL: 39 mg/dL — AB (ref >39)
LDL CALC COMMENT:: 2.7 ratio (ref 0.0–4.4)
LDL Chol Calc (NIH): 46 mg/dL (ref 0–99)
Triglycerides: 120 mg/dL (ref 0–149)
VLDL Cholesterol Cal: 22 mg/dL (ref 5–40)

## 2024-01-30 LAB — CBC
Hematocrit: 41.1 % (ref 34.0–46.6)
Hemoglobin: 13.5 g/dL (ref 11.1–15.9)
MCH: 29.7 pg (ref 26.6–33.0)
MCHC: 32.8 g/dL (ref 31.5–35.7)
MCV: 90 fL (ref 79–97)
Platelets: 239 x10E3/uL (ref 150–450)
RBC: 4.55 x10E6/uL (ref 3.77–5.28)
RDW: 13.3 % (ref 11.7–15.4)
WBC: 8 x10E3/uL (ref 3.4–10.8)

## 2024-01-30 LAB — TSH: TSH: 1.63 u[IU]/mL (ref 0.450–4.500)

## 2024-01-30 LAB — HEPATIC FUNCTION PANEL
ALT: 15 IU/L (ref 0–32)
AST: 20 IU/L (ref 0–40)
Albumin: 4.4 g/dL (ref 3.8–4.8)
Alkaline Phosphatase: 83 IU/L (ref 44–121)
Bilirubin Total: 0.5 mg/dL (ref 0.0–1.2)
Bilirubin, Direct: 0.23 mg/dL (ref 0.00–0.40)
Total Protein: 6.9 g/dL (ref 6.0–8.5)

## 2024-01-30 LAB — VITAMIN D 25 HYDROXY (VIT D DEFICIENCY, FRACTURES): Vit D, 25-Hydroxy: 71.6 ng/mL (ref 30.0–100.0)

## 2024-01-30 LAB — BASIC METABOLIC PANEL WITH GFR
BUN/Creatinine Ratio: 19 (ref 12–28)
BUN: 22 mg/dL (ref 8–27)
CO2: 24 mmol/L (ref 20–29)
Calcium: 10 mg/dL (ref 8.7–10.3)
Chloride: 100 mmol/L (ref 96–106)
Creatinine, Ser: 1.13 mg/dL — AB (ref 0.57–1.00)
Glucose: 86 mg/dL (ref 70–99)
Potassium: 4.4 mmol/L (ref 3.5–5.2)
Sodium: 141 mmol/L (ref 134–144)
eGFR: 50 mL/min/1.73 — AB (ref >59)

## 2024-01-30 LAB — URIC ACID: Uric Acid: 3.2 mg/dL (ref 3.1–7.9)

## 2024-01-30 LAB — HEMOGLOBIN A1C
Est. average glucose Bld gHb Est-mCnc: 117 mg/dL
Hgb A1c MFr Bld: 5.7 % — ABNORMAL HIGH (ref 4.8–5.6)

## 2024-02-02 ENCOUNTER — Ambulatory Visit: Payer: Self-pay | Admitting: Cardiology

## 2024-02-09 ENCOUNTER — Other Ambulatory Visit (HOSPITAL_COMMUNITY): Payer: Self-pay

## 2024-02-09 MED ORDER — MOUNJARO 7.5 MG/0.5ML ~~LOC~~ SOAJ
7.5000 mg | SUBCUTANEOUS | 0 refills | Status: DC
  Filled 2024-02-09: qty 2, 28d supply, fill #0

## 2024-02-09 NOTE — Addendum Note (Signed)
 Addended by: DARRELL BRUCKNER on: 02/09/2024 02:00 PM   Modules accepted: Orders

## 2024-02-23 ENCOUNTER — Encounter: Payer: Self-pay | Admitting: Cardiology

## 2024-02-24 ENCOUNTER — Other Ambulatory Visit: Payer: Self-pay

## 2024-02-24 ENCOUNTER — Other Ambulatory Visit (HOSPITAL_COMMUNITY): Payer: Self-pay

## 2024-02-24 MED ORDER — OZEMPIC (0.25 OR 0.5 MG/DOSE) 2 MG/3ML ~~LOC~~ SOPN
0.2500 mg | PEN_INJECTOR | SUBCUTANEOUS | 0 refills | Status: DC
  Filled 2024-02-24: qty 3, 28d supply, fill #0

## 2024-03-10 ENCOUNTER — Other Ambulatory Visit: Payer: Self-pay | Admitting: Cardiology

## 2024-03-10 ENCOUNTER — Other Ambulatory Visit: Payer: Self-pay | Admitting: Internal Medicine

## 2024-03-10 DIAGNOSIS — E785 Hyperlipidemia, unspecified: Secondary | ICD-10-CM

## 2024-03-11 ENCOUNTER — Encounter: Payer: Self-pay | Admitting: Cardiology

## 2024-03-12 ENCOUNTER — Encounter: Payer: Self-pay | Admitting: Internal Medicine

## 2024-03-18 ENCOUNTER — Other Ambulatory Visit: Payer: Self-pay

## 2024-03-18 MED ORDER — OZEMPIC (0.25 OR 0.5 MG/DOSE) 2 MG/3ML ~~LOC~~ SOPN
0.5000 mg | PEN_INJECTOR | SUBCUTANEOUS | 0 refills | Status: DC
  Filled 2024-03-18: qty 3, 28d supply, fill #0

## 2024-03-24 ENCOUNTER — Other Ambulatory Visit (HOSPITAL_BASED_OUTPATIENT_CLINIC_OR_DEPARTMENT_OTHER): Payer: Self-pay | Admitting: Pulmonary Disease

## 2024-03-25 DIAGNOSIS — Z1231 Encounter for screening mammogram for malignant neoplasm of breast: Secondary | ICD-10-CM | POA: Diagnosis not present

## 2024-03-25 LAB — HM MAMMOGRAPHY

## 2024-03-26 ENCOUNTER — Encounter: Payer: Self-pay | Admitting: Internal Medicine

## 2024-04-05 ENCOUNTER — Encounter (HOSPITAL_BASED_OUTPATIENT_CLINIC_OR_DEPARTMENT_OTHER): Payer: Self-pay | Admitting: Pulmonary Disease

## 2024-04-05 ENCOUNTER — Ambulatory Visit (HOSPITAL_BASED_OUTPATIENT_CLINIC_OR_DEPARTMENT_OTHER): Admitting: Pulmonary Disease

## 2024-04-05 VITALS — BP 144/77 | HR 92 | Ht 63.0 in | Wt 214.2 lb

## 2024-04-05 DIAGNOSIS — J9612 Chronic respiratory failure with hypercapnia: Secondary | ICD-10-CM | POA: Diagnosis not present

## 2024-04-05 DIAGNOSIS — J9611 Chronic respiratory failure with hypoxia: Secondary | ICD-10-CM

## 2024-04-05 DIAGNOSIS — J449 Chronic obstructive pulmonary disease, unspecified: Secondary | ICD-10-CM | POA: Diagnosis not present

## 2024-04-05 DIAGNOSIS — R600 Localized edema: Secondary | ICD-10-CM

## 2024-04-05 DIAGNOSIS — Z23 Encounter for immunization: Secondary | ICD-10-CM

## 2024-04-05 DIAGNOSIS — J309 Allergic rhinitis, unspecified: Secondary | ICD-10-CM

## 2024-04-05 DIAGNOSIS — R040 Epistaxis: Secondary | ICD-10-CM

## 2024-04-05 NOTE — Patient Instructions (Addendum)
 X Rx trial of airfit F30 FF mask  X FLu shot    VISIT SUMMARY: Today, we discussed your chronic respiratory issues and hypoxia, which are generally stable with your current treatment plan. We also addressed your lower extremity edema, allergic rhinitis, and epistaxis. You received a flu shot during this visit.  YOUR PLAN: -CHRONIC OBSTRUCTIVE PULMONARY DISEASE (COPD) WITH HYPOXIA AND CHRONIC RESPIRATORY FAILURE WITH HYPERCAPNIA: COPD is a chronic lung condition that makes it hard to breathe and can lead to low oxygen  levels and high carbon dioxide levels in your blood. Continue with your current oxygen  therapy and BiPAP machine at night. Keep using the jet nebulizer twice daily, along with your albuterol  inhaler and Breztri . You received a flu shot today to help prevent respiratory infections.  -LOWER EXTREMITY EDEMA: Lower extremity edema is swelling in the legs, often due to fluid retention. Your symptoms have improved with Lasix , so continue taking it as prescribed.  -ALLERGIC RHINITIS: Allergic rhinitis is an allergic reaction that causes sneezing, congestion, and a runny nose. Continue taking your allergy medication to manage your symptoms, especially during the fall allergy season.  -EPISTAXIS SECONDARY TO OXYGEN  THERAPY: Epistaxis is nosebleeds, which can be caused by the dryness from oxygen  therapy. Continue using saline nasal spray to keep your nasal passages moist and prevent nosebleeds.  INSTRUCTIONS: Please continue with your current medications and treatments as discussed. If you experience any new or worsening symptoms, contact our office. Follow up with us  as needed for any concerns or routine check-ups.                      Contains text generated by Abridge.                                 Contains text generated by Abridge.

## 2024-04-05 NOTE — Progress Notes (Signed)
 Subjective:    Patient ID: Rebecca Mcintyre, female    DOB: 1945/09/07, 78 y.o.   MRN: [id number]   78 yo ex-smoker for follow-up of COPD , chronic hypoxic /hypercarbic respiratory failure and pulmonary hypertension -on astral-avaps, POC Identical twin of Rebecca Mcintyre ,my patient who passed away -not a candidate for Zephyr valve [facility] )   PMH - CAD, diastolic HF,  dyslipidemia, obesity  - hospitalized 10/2018 for acute on chronic hypercarbic respiratory failure -slow to resolve COPD exacerbation 01/2020 - hospitalized 11/2020  for right lower extremity injury, course complicated by acute diastolic heart failure and encephalopathy due to narcotics elevated troponin  -hosp 04/2022 for flare   12/26/22 OV On ambulation she is able to maintain saturation with 5 L pulse POC    02/2023 LLL pna   Discussed the use of AI scribe software for clinical note transcription with the patient, who gave verbal consent to proceed.  History of Present Illness Rebecca Mcintyre is a 78 year old female with COPD who presents with chronic respiratory issues and hypoxia.  Her oxygen  levels are generally stable, though she experiences occasional challenges. She manages epistaxis with a water attachment to her oxygen  equipment. Fall allergies are bothersome, and she takes an allergy pill daily. She resumed Lasix  due to fluid retention, which has improved her symptoms.  She uses a new medication from [facility] twice daily, noting improvement in her condition. She switched to a jet nebulizer for more effective medication delivery and continues using an albuterol  inhaler and Breo.  She uses a BiPAP machine at night with a full face mask, finding it beneficial despite nasal marks. She is open to trying a different mask design for comfort.     Significant tests/ events reviewed   ABG 05/2022 7.36/49/69/93% ABG 01/2022 7.4 7/57/65 on 3 L oxygen  ABG 02/2020 7.4 2/44/53/87%     PFTs 05/2022 FEV1 0.73 /37%-improved to 0.89  postbronchodilator , DLCO 3.43   PFTs 12/29/2018 FVC 2.31 (82), FEV1 1.20 (56), ratio 52, no bronchodilatory response, DLCO 51 %   10/2018 CTA Chest- No PE; cardiomegaly, emphysema       NPSG 12/13/18  no obstructive sleep apnea, min O2 89%. Periodic limb movements did occur during sleep   Review of Systems  neg for any significant sore throat, dysphagia, itching, sneezing, nasal congestion or excess/ purulent secretions, fever, chills, sweats, unintended wt loss, pleuritic or exertional cp, hempoptysis, orthopnea pnd or change in chronic leg swelling. Also denies presyncope, palpitations, heartburn, abdominal pain, nausea, vomiting, diarrhea or change in bowel or urinary habits, dysuria,hematuria, rash, arthralgias, visual complaints, headache, numbness weakness or ataxia.      Objective:   Physical Exam  Gen. Pleasant, obese, in no distress ENT - no lesions, no post nasal drip Neck: No JVD, no thyromegaly, no carotid bruits Lungs: no use of accessory muscles, no dullness to percussion, decreased without rales or rhonchi  Cardiovascular: Rhythm regular, heart sounds  normal, no murmurs or gallops, no peripheral edema Musculoskeletal: No deformities, no cyanosis or clubbing , no tremors       Assessment & Plan:   Assessment and Plan Assessment & Plan Chronic obstructive pulmonary disease (COPD) with hypoxia and chronic respiratory failure with hypercapnia COPD with hypoxia and chronic respiratory failure with hypercapnia is well-managed. She reports feeling good overall with no bad days. Oxygen  therapy is effective, though some days are more challenging. She uses a jet nebulizer twice daily, which she reports improves medication delivery.  She continues to use albuterol  inhaler and Breo Ellipta . She is aware of the risks associated with Zephyr valves and does not qualify for them due to viable lung capacity. - Continue oxygen  therapy and BiPAP. - Continue using jet nebulizer twice  daily. - Continue albuterol  inhaler and Breo Ellipta . - Administered flu shot today.  Lower extremity edema Improved with the use of Lasix . She reports less swelling in the previously affected leg. - Continue Lasix  as prescribed.  Allergic rhinitis Managed with allergy medication. She reports increased symptoms with the fall allergies but manages them effectively. - Continue allergy medication.  Epistaxis secondary to oxygen  therapy Epistaxis is managed with saline nasal spray to moisturize nasal passages. She reports improvement with this intervention. - Continue using saline nasal spray to prevent epistaxis.

## 2024-04-19 ENCOUNTER — Other Ambulatory Visit (HOSPITAL_COMMUNITY): Payer: Self-pay

## 2024-04-19 ENCOUNTER — Encounter: Payer: Self-pay | Admitting: Cardiology

## 2024-04-19 MED ORDER — SEMAGLUTIDE (1 MG/DOSE) 4 MG/3ML ~~LOC~~ SOPN
1.0000 mg | PEN_INJECTOR | SUBCUTANEOUS | 0 refills | Status: DC
  Filled 2024-04-19: qty 3, 28d supply, fill #0

## 2024-04-21 ENCOUNTER — Ambulatory Visit

## 2024-04-21 VITALS — Ht 63.0 in | Wt 208.0 lb

## 2024-04-21 DIAGNOSIS — Z0000 Encounter for general adult medical examination without abnormal findings: Secondary | ICD-10-CM | POA: Diagnosis not present

## 2024-04-21 NOTE — Progress Notes (Addendum)
 I connected with  Rebecca Mcintyre on 04/27/24 by a audio enabled telemedicine application and verified that I am speaking with the correct person using two identifiers.  Patient Location: Home  Provider Location: Home Office  Persons Participating in Visit: Patient.  I discussed the limitations of evaluation and management by telemedicine. The patient expressed understanding and agreed to proceed.  Vital Signs: Because this visit was a virtual/telehealth visit, some criteria may be missing or patient reported. Any vitals not documented were not able to be obtained and vitals that have been documented are patient reported.  Chief Complaint  Patient presents with   Medicare Wellness     Subjective:   Rebecca Mcintyre is a 78 y.o. female who presents for a Medicare Annual Wellness Visit.  Allergies (verified) Atorvastatin   History: Past Medical History:  Diagnosis Date   Acute on chronic respiratory failure with hypoxia and hypercapnia (HCC) 11/05/2018   Allergy    ASCUS on Pap smear    had repeat x 2 normal with Dr. Estelle 2011   Coronary artery disease    Gout    Hyperlipidemia    Hypertension    Myocardial infarction [facility])    2002 95% proximal LAD stenosis with thrombus followed by 90% stenosis, the first diagonal 80% stenosis, circumflex 30% stenosis, circumflex obtuse marginal subbranch 70% stenosis, PDA 40% stenosis. She had a drug-eluting stent placement with a Pixel stent   Osteopenia    Solitary kidney    Past Surgical History:  Procedure Laterality Date   CARDIAC CATHETERIZATION     CORONARY ANGIOPLASTY     CORONARY ANGIOPLASTY WITH STENT PLACEMENT  2002   HEMORRHOID SURGERY  2011   LAD Stent angioplasty  2002   Right oophorectomy with salpingectomy     benign growth Dr. Nelwyn   Family History  Problem Relation Age of Onset   Breast cancer Mother 53   Lung cancer Father    Heart attack Brother        Died age 74 MI   Colon cancer Brother    Heart  disease Daughter 29       smoker and overweight    Esophageal cancer Neg Hx    Pancreatic cancer Neg Hx    Rectal cancer Neg Hx    Stomach cancer Neg Hx    Social History   Occupational History   Occupation: semi retired    Associate Professor: Scientist, Research (life Sciences)  Tobacco Use   Smoking status: Former    Current packs/day: 0.00    Average packs/day: 1 pack/day for 39.0 years (39.0 ttl pk-yrs)    Types: Cigarettes    Start date: 11/04/1979    Quit date: 11/04/2018    Years since quitting: 5.4   Smokeless tobacco: Never  Vaping Use   Vaping status: Never Used  Substance and Sexual Activity   Alcohol use: Yes    Comment: 1-2 per month   Drug use: No   Sexual activity: Not Currently    Partners: Male    Comment: single   Tobacco Counseling Counseling given: No  SDOH Screenings   Food Insecurity: No Food Insecurity (04/21/2024)  Housing: Unknown (04/21/2024)  Transportation Needs: No Transportation Needs (04/21/2024)  Utilities: Not At Risk (04/21/2024)  Alcohol Screen: Low Risk  (04/05/2023)  Depression (PHQ2-9): Low Risk  (04/21/2024)  Financial Resource Strain: Low Risk  (04/10/2024)  Physical Activity: Insufficiently Active (04/21/2024)  Social Connections: Moderately Integrated (04/21/2024)  Stress: No Stress Concern Present (04/21/2024)  Tobacco Use:  Medium Risk (04/21/2024)  Health Literacy: Adequate Health Literacy (04/21/2024)   See flowsheets for full screening details  Depression Screen PHQ 2 & 9 Depression Scale- Over the past 2 weeks, how often have you been bothered by any of the following problems? Little interest or pleasure in doing things: 0 Feeling down, depressed, or hopeless (PHQ Adolescent also includes...irritable): 0 PHQ-2 Total Score: 0 Trouble falling or staying asleep, or sleeping too much: 0 Feeling tired or having little energy: 0 Poor appetite or overeating (PHQ Adolescent also includes...weight loss): 0 Feeling bad about yourself - or that you are a  failure or have let yourself or your family down: 0 Trouble concentrating on things, such as reading the newspaper or watching television (PHQ Adolescent also includes...like school work): 0 Moving or speaking so slowly that other people could have noticed. Or the opposite - being so fidgety or restless that you have been moving around a lot more than usual: 0 Thoughts that you would be better off dead, or of hurting yourself in some way: 0 PHQ-9 Total Score: 0     Goals Addressed               This Visit's Progress     Increase physical activity (pt-stated)        Remain active       Visit info / Clinical Intake: Medicare Wellness Visit Type:: Subsequent Annual Wellness Visit Persons participating in visit and providing information:: patient Medicare Wellness Visit Mode:: Telephone If telephone:: video declined Since this visit was completed virtually, some vitals may be partially provided or unavailable. Missing vitals are due to the limitations of the virtual format.: pt reported vitals If Telephone or Video please confirm:: I connected with the patient using audio enabled telemedicine application and verified that I am speaking with the correct person using two identifiers Patient Location:: Home Provider Location:: Office Interpreter Needed?: No Pre-visit prep was completed: yes AWV questionnaire completed by patient prior to visit?: yes Date:: 04/17/24 Living arrangements:: (!) lives alone Patient's Overall Health Status Rating: very good Typical amount of pain: none Does pain affect daily life?: no Are you currently prescribed opioids?: no  Dietary Habits and Nutritional Risks How many meals a day?: 2 Eats fruit and vegetables daily?: yes Most meals are obtained by: preparing own meals In the last 2 weeks, have you had any of the following?: none Diabetic:: (!) yes Any non-healing wounds?: no How often do you check your BS?: 2 Would you like to be referred to a  Nutritionist or for Diabetic Management? : no  Functional Status Activities of Daily Living (to include ambulation/medication): Independent Ambulation: Independent with device- listed below Home Assistive Devices/Equipment: Other (Comment) (Hearing Aids) Medication Administration: Independent Home Management (perform basic housework or laundry): Independent Manage your own finances?: yes Primary transportation is: driving Concerns about vision?: no *vision screening is required for WTM* Concerns about hearing?: (!) yes Uses hearing aids?: (!) yes Hear whispered voice?: yes  Fall Screening Falls in the past year?: 0 Number of falls in past year: 0 Was there an injury with Fall?: 0 Fall Risk Category Calculator: 0 Patient Fall Risk Level: Low Fall Risk  Fall Risk Patient at Risk for Falls Due to: No Fall Risks  Home and Transportation Safety: All rugs have non-skid backing?: yes All stairs or steps have railings?: yes Grab bars in the bathtub or shower?: yes Have non-skid surface in bathtub or shower?: yes Good home lighting?: yes Regular seat belt use?:  yes Hospital stays in the last year:: no  Cognitive Assessment Difficulty concentrating, remembering, or making decisions? : no Will 6CIT or Mini Cog be Completed: no 6CIT or Mini Cog Declined: patient alert, oriented, able to answer questions appropriately and recall recent events  Advance Directives (For Healthcare) Does Patient Have a Medical Advance Directive?: Yes Does patient want to make changes to medical advance directive?: No - Patient declined Type of Advance Directive: Healthcare Power of [place]; Living will Copy of Healthcare Power of Attorney in Chart?: No - copy requested Copy of Living Will in Chart?: No - copy requested  Reviewed/Updated  Reviewed/Updated: Reviewed All (Medical, Surgical, Family, Medications, Allergies, Care Teams, Patient Goals)        Objective:    Today's Vitals   04/21/24  1100  Weight: 208 lb (94.3 kg)  Height: 5' 3 (1.6 m)   Body mass index is 36.85 kg/m.  Current Medications (verified) Outpatient Encounter Medications as of 04/21/2024  Medication Sig   ACCU-CHEK GUIDE test strip USE AS DIRECTED TWICE A DAY   Accu-Chek Softclix Lancets lancets Use as instructed twice a day Diagnosis E11.9   acetaminophen  (TYLENOL ) 500 MG tablet Take 1,000 mg by mouth every 6 (six) hours as needed for mild pain.   albuterol  (PROVENTIL ) (2.5 MG/3ML) 0.083% nebulizer solution Take 3 mLs (2.5 mg total) by nebulization every 6 (six) hours as needed for wheezing or shortness of breath.   albuterol  (VENTOLIN  HFA) 108 (90 Base) MCG/ACT inhaler TAKE 2 PUFFS BY MOUTH EVERY 6 HOURS AS NEEDED FOR WHEEZE OR SHORTNESS OF BREATH   allopurinol  (ZYLOPRIM ) 100 MG tablet TAKE 1 TABLET BY MOUTH TWICE A DAY   aspirin  81 MG tablet Take 81 mg by mouth daily.   Blood Glucose Monitoring Suppl (ACCU-CHEK GUIDE) w/Device KIT Use as directed twice a day Diagnosis E11.9   budesonide -glycopyrrolate-formoterol  (BREZTRI  AEROSPHERE) 160-9-4.8 MCG/ACT AERO inhaler Inhale 2 puffs into the lungs 2 (two) times daily.   Ensifentrine  ([name] ) 3 MG/2.5ML SUSP Inhale 3 mg into the lungs in the morning and at bedtime. DO NOT MIX WITH OTHER INHALED MEDS. Dx: J44.9   FARXIGA  10 MG TABS tablet TAKE 1 TABLET BY MOUTH EVERY DAY   furosemide  (LASIX ) 40 MG tablet TAKE 1 TABLET BY MOUTH EVERY DAY   guaiFENesin  (MUCINEX ) 600 MG 12 hr tablet Take 2 tablets (1,200 mg total) by mouth 2 (two) times daily.   metFORMIN  (GLUCOPHAGE ) 500 MG tablet Take 1 tablet (500 mg total) by mouth in the morning and at bedtime.   metoprolol  tartrate (LOPRESSOR ) 25 MG tablet TAKE 0.5 TABLETS BY MOUTH 2 TIMES DAILY.   nitroGLYCERIN  (NITROSTAT ) 0.4 MG SL tablet Place 1 tablet (0.4 mg total) under the tongue every 5 (five) minutes as needed for chest pain.   Polyethylene Glycol 400 (VISINE DRY EYE RELIEF OP) Place 1 drop into both eyes daily as  needed (dry eyes).   potassium chloride  (KLOR-CON  M10) 10 MEQ tablet Take 1 tablet (10 mEq total) by mouth 2 (two) times daily.   rOPINIRole  (REQUIP ) 0.5 MG tablet TAKE 1 TABLET AT BEDTIME FOR RESTLESS LEG   rosuvastatin  (CRESTOR ) 20 MG tablet TAKE 1 TABLET BY MOUTH EVERY DAY   Semaglutide , 1 MG/DOSE, 4 MG/3ML SOPN Inject 1 mg into the skin once a week.   No facility-administered encounter medications on file as of 04/21/2024.   Hearing/Vision screen Hearing Screening - Comments:: Wears Hearing Aids Vision Screening - Comments:: Wears rx glasses - up to date with routine eye  exams with  Dr Abigail Immunizations and Health Maintenance Health Maintenance  Topic Date Due   DTaP/Tdap/Td (2 - Tdap) 09/13/2017   COVID-19 Vaccine (6 - 2025-26 season) 01/26/2024   FOOT EXAM  05/13/2024   OPHTHALMOLOGY EXAM  05/14/2024   HEMOGLOBIN A1C  07/28/2024   Lung Cancer Screening  11/16/2024   Diabetic kidney evaluation - eGFR measurement  01/28/2025   Diabetic kidney evaluation - Urine ACR  01/28/2025   Medicare Annual Wellness (AWV)  04/21/2025   Pneumococcal Vaccine: 50+ Years  Completed   Influenza Vaccine  Completed   Bone Density Scan  Completed   Hepatitis C Screening  Completed   Zoster Vaccines- Shingrix  Completed   Meningococcal B Vaccine  Aged Out   Mammogram  Discontinued   Colonoscopy  Discontinued        Assessment/Plan:  This is a routine wellness examination for [facility].  Patient Care Team: Panosh, Apolinar POUR, MD as PCP - General (Internal Medicine) Lavona Agent, MD as PCP - Cardiology (Cardiology) Estelle Service, MD as Attending Physician (Obstetrics and Gynecology) Abran Norleen SAILOR, MD as Attending Physician (Gastroenterology) Lavona Agent, MD as Consulting Physician (Cardiology) Jude Harden GAILS, MD as Consulting Physician (Pulmonary Disease) Jesus Oliphant, MD as Consulting Physician (Otolaryngology) Lionell Jon DEL, [facility] (Pharmacist) Abigail Maude POUR [facility])  I have  personally reviewed and noted the following in the patient's chart:   Medical and social history Use of alcohol, tobacco or illicit drugs  Current medications and supplements including opioid prescriptions. Functional ability and status Nutritional status Physical activity Advanced directives List of other physicians Hospitalizations, surgeries, and ER visits in previous 12 months Vitals Screenings to include cognitive, depression, and falls Referrals and appointments  No orders of the defined types were placed in this encounter.  In addition, I have reviewed and discussed with patient certain preventive protocols, quality metrics, and best practice recommendations. A written personalized care plan for preventive services as well as general preventive health recommendations were provided to patient.   Rojelio LELON Blush, LPN   [id number]   Return in 1 year on 04/27/25  After Visit Summary: (MyChart) Due to this being a telephonic visit, the after visit summary with patients personalized plan was offered to patient via MyChart   Nurse Notes: None

## 2024-04-21 NOTE — Patient Instructions (Addendum)
 Rebecca Mcintyre,  Thank you for taking the time for your Medicare Wellness Visit. I appreciate your continued commitment to your health goals. Please review the care plan we discussed, and feel free to reach out if I can assist you further.  Please note that Annual Wellness Visits do not include a physical exam. Some assessments may be limited, especially if the visit was conducted virtually. If needed, we may recommend an in-person follow-up with your provider.  Ongoing Care Seeing your primary care provider every 3 to 6 months helps us  monitor your health and provide consistent, personalized care.   Referrals If a referral was made during today's visit and you haven't received any updates within two weeks, please contact the referred provider directly to check on the status.  Recommended Screenings:  Health Maintenance  Topic Date Due   DTaP/Tdap/Td vaccine (2 - Tdap) 09/13/2017   COVID-19 Vaccine (6 - 2025-26 season) 01/26/2024   Complete foot exam   05/13/2024   Eye exam for diabetics  05/14/2024   Hemoglobin A1C  07/28/2024   Screening for Lung Cancer  11/16/2024   Yearly kidney function blood test for diabetes  01/28/2025   Yearly kidney health urinalysis for diabetes  01/28/2025   Medicare Annual Wellness Visit  04/21/2025   Pneumococcal Vaccine for age over 29  Completed   Flu Shot  Completed   Osteoporosis screening with Bone Density Scan  Completed   Hepatitis C Screening  Completed   Zoster (Shingles) Vaccine  Completed   Meningitis B Vaccine  Aged Out   Breast Cancer Screening  Discontinued   Colon Cancer Screening  Discontinued       04/21/2024   11:01 AM  Advanced Directives  Does Patient Have a Medical Advance Directive? Yes  Type of Estate Agent of [place];Living will  Does patient want to make changes to medical advance directive? No - Patient declined  Copy of Healthcare Power of Attorney in Chart? No - copy requested    Vision: Annual  vision screenings are recommended for early detection of glaucoma, cataracts, and diabetic retinopathy. These exams can also reveal signs of chronic conditions such as diabetes and high blood pressure.  Dental: Annual dental screenings help detect early signs of oral cancer, gum disease, and other conditions linked to overall health, including heart disease and diabetes.  Please see the attached documents for additional preventive care recommendations.

## 2024-05-11 ENCOUNTER — Other Ambulatory Visit

## 2024-05-11 ENCOUNTER — Other Ambulatory Visit: Payer: Self-pay

## 2024-05-11 ENCOUNTER — Other Ambulatory Visit: Payer: Self-pay | Admitting: Internal Medicine

## 2024-05-11 ENCOUNTER — Other Ambulatory Visit (HOSPITAL_COMMUNITY): Payer: Self-pay

## 2024-05-11 DIAGNOSIS — E785 Hyperlipidemia, unspecified: Secondary | ICD-10-CM | POA: Diagnosis not present

## 2024-05-11 DIAGNOSIS — E119 Type 2 diabetes mellitus without complications: Secondary | ICD-10-CM | POA: Diagnosis not present

## 2024-05-11 DIAGNOSIS — I5032 Chronic diastolic (congestive) heart failure: Secondary | ICD-10-CM

## 2024-05-11 DIAGNOSIS — E559 Vitamin D deficiency, unspecified: Secondary | ICD-10-CM

## 2024-05-11 DIAGNOSIS — Z9981 Dependence on supplemental oxygen: Secondary | ICD-10-CM | POA: Diagnosis not present

## 2024-05-11 DIAGNOSIS — I10 Essential (primary) hypertension: Secondary | ICD-10-CM

## 2024-05-11 DIAGNOSIS — Z8739 Personal history of other diseases of the musculoskeletal system and connective tissue: Secondary | ICD-10-CM | POA: Diagnosis not present

## 2024-05-11 DIAGNOSIS — J449 Chronic obstructive pulmonary disease, unspecified: Secondary | ICD-10-CM

## 2024-05-11 DIAGNOSIS — I2510 Atherosclerotic heart disease of native coronary artery without angina pectoris: Secondary | ICD-10-CM | POA: Diagnosis not present

## 2024-05-11 DIAGNOSIS — Z79899 Other long term (current) drug therapy: Secondary | ICD-10-CM

## 2024-05-11 DIAGNOSIS — G2581 Restless legs syndrome: Secondary | ICD-10-CM

## 2024-05-11 LAB — LIPID PANEL
Cholesterol: 110 mg/dL (ref 28–200)
HDL: 42.6 mg/dL (ref >39.00)
LDL Cholesterol: 37 mg/dL (ref 0–99)
NonHDL: 67.57
Total CHOL/HDL Ratio: 3
Triglycerides: 155 mg/dL — ABNORMAL HIGH (ref 0.0–149.0)
VLDL: 31 mg/dL (ref 0.0–40.0)

## 2024-05-11 LAB — CBC WITH DIFFERENTIAL/PLATELET
Basophils Absolute: 0.1 K/uL (ref 0.0–0.1)
Basophils Relative: 0.8 % (ref 0.0–3.0)
Eosinophils Absolute: 0.1 K/uL (ref 0.0–0.7)
Eosinophils Relative: 2.2 % (ref 0.0–5.0)
HCT: 41 % (ref 36.0–46.0)
Hemoglobin: 13.6 g/dL (ref 12.0–15.0)
Lymphocytes Relative: 38.1 % (ref 12.0–46.0)
Lymphs Abs: 2.6 K/uL (ref 0.7–4.0)
MCHC: 33.2 g/dL (ref 30.0–36.0)
MCV: 88.9 fl (ref 78.0–100.0)
Monocytes Absolute: 0.7 K/uL (ref 0.1–1.0)
Monocytes Relative: 10.4 % (ref 3.0–12.0)
Neutro Abs: 3.3 K/uL (ref 1.4–7.7)
Neutrophils Relative %: 48.5 % (ref 43.0–77.0)
Platelets: 242 K/uL (ref 150.0–400.0)
RBC: 4.61 Mil/uL (ref 3.87–5.11)
RDW: 13.9 % (ref 11.5–15.5)
WBC: 6.8 K/uL (ref 4.0–10.5)

## 2024-05-11 LAB — HEPATIC FUNCTION PANEL
ALT: 15 U/L (ref 0–35)
AST: 18 U/L (ref 5–37)
Albumin: 4.5 g/dL (ref 3.5–5.2)
Alkaline Phosphatase: 76 U/L (ref 39–117)
Bilirubin, Direct: 0.1 mg/dL (ref 0.0–0.3)
Total Bilirubin: 0.5 mg/dL (ref 0.2–1.2)
Total Protein: 7.3 g/dL (ref 6.0–8.3)

## 2024-05-11 LAB — BASIC METABOLIC PANEL WITH GFR
BUN: 25 mg/dL — ABNORMAL HIGH (ref 6–23)
CO2: 33 meq/L — ABNORMAL HIGH (ref 19–32)
Calcium: 10.4 mg/dL (ref 8.4–10.5)
Chloride: 98 meq/L (ref 96–112)
Creatinine, Ser: 1.15 mg/dL (ref 0.40–1.20)
GFR: 45.92 mL/min — ABNORMAL LOW (ref >60.00)
Glucose, Bld: 96 mg/dL (ref 70–99)
Potassium: 4 meq/L (ref 3.5–5.1)
Sodium: 141 meq/L (ref 135–145)

## 2024-05-11 LAB — URIC ACID: Uric Acid, Serum: 3.7 mg/dL (ref 2.4–7.0)

## 2024-05-11 LAB — VITAMIN D 25 HYDROXY (VIT D DEFICIENCY, FRACTURES): VITD: 51.99 ng/mL (ref 30.00–100.00)

## 2024-05-11 LAB — HEMOGLOBIN A1C: Hgb A1c MFr Bld: 5.8 % (ref 4.6–6.5)

## 2024-05-11 LAB — TSH: TSH: 3.95 u[IU]/mL (ref 0.35–5.50)

## 2024-05-11 LAB — VITAMIN B12: Vitamin B-12: 205 pg/mL — ABNORMAL LOW (ref 211–911)

## 2024-05-11 MED ORDER — OZEMPIC (2 MG/DOSE) 8 MG/3ML ~~LOC~~ SOPN
2.0000 mg | PEN_INJECTOR | SUBCUTANEOUS | 5 refills | Status: AC
  Filled 2024-05-11: qty 3, 28d supply, fill #0
  Filled 2024-06-07: qty 3, 28d supply, fill #1

## 2024-05-11 NOTE — Addendum Note (Signed)
 Addended by: DARRELL BRUCKNER on: 05/11/2024 08:25 AM   Modules accepted: Orders

## 2024-05-12 ENCOUNTER — Other Ambulatory Visit (HOSPITAL_BASED_OUTPATIENT_CLINIC_OR_DEPARTMENT_OTHER): Payer: Self-pay | Admitting: Pulmonary Disease

## 2024-05-18 ENCOUNTER — Encounter: Payer: Self-pay | Admitting: Internal Medicine

## 2024-05-18 ENCOUNTER — Ambulatory Visit: Admitting: Internal Medicine

## 2024-05-18 VITALS — BP 118/74 | HR 83 | Temp 97.6°F | Ht 63.0 in | Wt 215.6 lb

## 2024-05-18 DIAGNOSIS — G2581 Restless legs syndrome: Secondary | ICD-10-CM | POA: Diagnosis not present

## 2024-05-18 DIAGNOSIS — Z7985 Long-term (current) use of injectable non-insulin antidiabetic drugs: Secondary | ICD-10-CM

## 2024-05-18 DIAGNOSIS — Z0000 Encounter for general adult medical examination without abnormal findings: Secondary | ICD-10-CM

## 2024-05-18 DIAGNOSIS — Z8739 Personal history of other diseases of the musculoskeletal system and connective tissue: Secondary | ICD-10-CM | POA: Diagnosis not present

## 2024-05-18 DIAGNOSIS — E119 Type 2 diabetes mellitus without complications: Secondary | ICD-10-CM | POA: Diagnosis not present

## 2024-05-18 DIAGNOSIS — E785 Hyperlipidemia, unspecified: Secondary | ICD-10-CM | POA: Diagnosis not present

## 2024-05-18 DIAGNOSIS — Z9981 Dependence on supplemental oxygen: Secondary | ICD-10-CM | POA: Diagnosis not present

## 2024-05-18 DIAGNOSIS — R7989 Other specified abnormal findings of blood chemistry: Secondary | ICD-10-CM

## 2024-05-18 DIAGNOSIS — Z809 Family history of malignant neoplasm, unspecified: Secondary | ICD-10-CM

## 2024-05-18 DIAGNOSIS — I10 Essential (primary) hypertension: Secondary | ICD-10-CM | POA: Diagnosis not present

## 2024-05-18 DIAGNOSIS — Z7984 Long term (current) use of oral hypoglycemic drugs: Secondary | ICD-10-CM

## 2024-05-18 DIAGNOSIS — Z79899 Other long term (current) drug therapy: Secondary | ICD-10-CM | POA: Diagnosis not present

## 2024-05-18 MED ORDER — ROPINIROLE HCL 0.5 MG PO TABS: ORAL_TABLET | ORAL | 1 refills | Status: AC

## 2024-05-18 MED ORDER — METOPROLOL TARTRATE 25 MG PO TABS: ORAL_TABLET | ORAL | 1 refills | Status: AC

## 2024-05-18 MED ORDER — ALLOPURINOL 100 MG PO TABS: 100.0000 mg | ORAL_TABLET | Freq: Two times a day (BID) | ORAL | 1 refills | Status: AC

## 2024-05-18 NOTE — Patient Instructions (Signed)
 Good to see you today .   Will do referral for  genetic counseling .   Add b12 1000 mcg or similar per day . Vit d level is good.   Get  appt with   GI team dr Abran.   ROV in 4-6 months

## 2024-05-18 NOTE — Progress Notes (Signed)
 "  Chief Complaint  Patient presents with   Annual Exam    Pt is here with daughter, [name]. Pt reports she is having a hard time to breathe today.  Daughter reports pt has been doing a lot for the holiday.     HPI: Patient  Rebecca Mcintyre  78 y.o. comes in today for Preventive Health Care visit  and  Chronic disease management DM: doing well  metformin  ozempic  farxiga  CAD per cards  no sig changes  needs refill metoprolol   Pulmonary : o2 dependent  but stable  see last notes  from team  Concerns about fami hx of breast cancer, mom  colon brother   lung cancer and  surveillance for high risk conditions   some constipation from ozempic  but not significant  RLS :feels requip  is very helpful for sleep  asks for refill  Gout : no flares on allopurinol    would like to continue  denies se risk.   Health Maintenance  Topic Date Due   OPHTHALMOLOGY EXAM  05/14/2024   COVID-19 Vaccine (6 - 2025-26 season) 06/03/2024 (Originally 01/26/2024)   DTaP/Tdap/Td (2 - Tdap) 05/18/2025 (Originally 09/13/2017)   HEMOGLOBIN A1C  11/09/2024   Lung Cancer Screening  11/16/2024   Diabetic kidney evaluation - Urine ACR  01/28/2025   Medicare Annual Wellness (AWV)  04/21/2025   Diabetic kidney evaluation - eGFR measurement  05/11/2025   FOOT EXAM  05/18/2025   Pneumococcal Vaccine: 50+ Years  Completed   Influenza Vaccine  Completed   Bone Density Scan  Completed   Hepatitis C Screening  Completed   Zoster Vaccines- Shingrix  Completed   Meningococcal B Vaccine  Aged Out   Mammogram  Discontinued   Colonoscopy  Discontinued      ROS:  GEN/ HEENT: No fever, significant weight changes sweats headaches vision problems hearing changes, CV/ PULM; No chest paincough, syncope,edema  change in exercise tolerance. GI /GU: No adominal pain, vomiting, change in bowel habits. No blood in the stool. No significant GU symptoms. SKIN/HEME: ,no acute skin rashes suspicious lesions or bleeding. No  lymphadenopathy, nodules, masses.  NEURO/ PSYCH:  No neurologic signs such as weakness numbness. No depression anxiety. IMM/ Allergy: No unusual infections.  Allergy .   REST of 12 system review negative except as per HPI   Past Medical History:  Diagnosis Date   Acute on chronic respiratory failure with hypoxia and hypercapnia (HCC) 11/05/2018   Allergy    ASCUS on Pap smear    had repeat x 2 normal with Dr. Estelle 2011   Coronary artery disease    Gout    Hyperlipidemia    Hypertension    Myocardial infarction [facility])    2002 95% proximal LAD stenosis with thrombus followed by 90% stenosis, the first diagonal 80% stenosis, circumflex 30% stenosis, circumflex obtuse marginal subbranch 70% stenosis, PDA 40% stenosis. She had a drug-eluting stent placement with a Pixel stent   Osteopenia    Solitary kidney     Past Surgical History:  Procedure Laterality Date   CARDIAC CATHETERIZATION     CORONARY ANGIOPLASTY     CORONARY ANGIOPLASTY WITH STENT PLACEMENT  2002   HEMORRHOID SURGERY  2011   LAD Stent angioplasty  2002   Right oophorectomy with salpingectomy     benign growth Dr. Nelwyn    Family History  Problem Relation Age of Onset   Breast cancer Mother 42   Lung cancer Father    Heart attack  Brother        Died age 48 MI   Colon cancer Brother    Heart disease Daughter 12       smoker and overweight    Esophageal cancer Neg Hx    Pancreatic cancer Neg Hx    Rectal cancer Neg Hx    Stomach cancer Neg Hx     Social History   Socioeconomic History   Marital status: Single    Spouse name: Not on file   Number of children: 2   Years of education: 14   Highest education level: Associate degree: occupational, scientist, product/process development, or vocational program  Occupational History   Occupation: semi retired    Associate Professor: Scientist, Research (life Sciences)  Tobacco Use   Smoking status: Former    Current packs/day: 0.00    Average packs/day: 1 pack/day for 39.0 years (39.0 ttl pk-yrs)    Types:  Cigarettes    Start date: 11/04/1979    Quit date: 11/04/2018    Years since quitting: 5.5   Smokeless tobacco: Never  Vaping Use   Vaping status: Never Used  Substance and Sexual Activity   Alcohol use: Yes    Comment: 1-2 per month   Drug use: No   Sexual activity: Not Currently    Partners: Male    Comment: single  Other Topics Concern   Not on file  Social History Narrative   Lives alone   Left Handed   Drinks 1-2 cups caffeine daily      Enjoys company of 2 cats, watching TV and movies, read on her Ipad, social media   Social Drivers of Health   Tobacco Use: Medium Risk (05/18/2024)   Patient History    Smoking Tobacco Use: Former    Smokeless Tobacco Use: Never    Passive Exposure: Not on file  Financial Resource Strain: Low Risk (04/10/2024)   Overall Financial Resource Strain (CARDIA)    Difficulty of Paying Living Expenses: Not hard at all  Food Insecurity: No Food Insecurity (04/21/2024)   Epic    Worried About Programme Researcher, Broadcasting/film/video in the Last Year: Never true    Ran Out of Food in the Last Year: Never true  Transportation Needs: No Transportation Needs (04/21/2024)   Epic    Lack of Transportation (Medical): No    Lack of Transportation (Non-Medical): No  Physical Activity: Insufficiently Active (04/21/2024)   Exercise Vital Sign    Days of Exercise per Week: 3 days    Minutes of Exercise per Session: 20 min  Stress: No Stress Concern Present (04/21/2024)   [employer] of Occupational Health - Occupational Stress Questionnaire    Feeling of Stress: Not at all  Social Connections: Moderately Integrated (04/21/2024)   Social Connection and Isolation Panel    Frequency of Communication with Friends and Family: More than three times a week    Frequency of Social Gatherings with Friends and Family: More than three times a week    Attends Religious Services: More than 4 times per year    Active Member of Clubs or Organizations: Yes    Attends Tax Inspector Meetings: More than 4 times per year    Marital Status: Divorced  Depression (PHQ2-9): Low Risk (04/21/2024)   Depression (PHQ2-9)    PHQ-2 Score: 0  Alcohol Screen: Low Risk (04/05/2023)   Alcohol Screen    Last Alcohol Screening Score (AUDIT): 0  Housing: Unknown (04/21/2024)   Epic    Unable to Pay for Housing in  the Last Year: No    Number of Times Moved in the Last Year: Not on file    Homeless in the Last Year: No  Utilities: Not At Risk (04/21/2024)   Epic    Threatened with loss of utilities: No  Health Literacy: Adequate Health Literacy (04/21/2024)   B1300 Health Literacy    Frequency of need for help with medical instructions: Never    Outpatient Medications Prior to Visit  Medication Sig Dispense Refill   ACCU-CHEK GUIDE TEST test strip USE AS DIRECTED TWICE A DAY 100 strip 3   Accu-Chek Softclix Lancets lancets Use as instructed twice a day Diagnosis E11.9 100 each 3   acetaminophen  (TYLENOL ) 500 MG tablet Take 1,000 mg by mouth every 6 (six) hours as needed for mild pain.     albuterol  (PROVENTIL ) (2.5 MG/3ML) 0.083% nebulizer solution Take 3 mLs (2.5 mg total) by nebulization every 6 (six) hours as needed for wheezing or shortness of breath. 300 mL 11   albuterol  (VENTOLIN  HFA) 108 (90 Base) MCG/ACT inhaler TAKE 2 PUFFS BY MOUTH EVERY 6 HOURS AS NEEDED FOR WHEEZE OR SHORTNESS OF BREATH 8.5 each 1   aspirin  81 MG tablet Take 81 mg by mouth daily.     Blood Glucose Monitoring Suppl (ACCU-CHEK GUIDE) w/Device KIT Use as directed twice a day Diagnosis E11.9 1 kit 2   budesonide -glycopyrrolate-formoterol  (BREZTRI  AEROSPHERE) 160-9-4.8 MCG/ACT AERO inhaler Inhale 2 puffs into the lungs 2 (two) times daily. 3 each 3   Ensifentrine  ([name] ) 3 MG/2.5ML SUSP Inhale 3 mg into the lungs in the morning and at bedtime. DO NOT MIX WITH OTHER INHALED MEDS. Dx: J44.9 450 mL 1   FARXIGA  10 MG TABS tablet TAKE 1 TABLET BY MOUTH EVERY DAY 90 tablet 2   furosemide  (LASIX ) 40  MG tablet TAKE 1 TABLET BY MOUTH EVERY DAY 90 tablet 1   guaiFENesin  (MUCINEX ) 600 MG 12 hr tablet Take 2 tablets (1,200 mg total) by mouth 2 (two) times daily. 60 tablet 0   metFORMIN  (GLUCOPHAGE ) 500 MG tablet TAKE 2 TABLETS (1,000 MG TOTAL) BY MOUTH IN THE MORNING AND AT BEDTIME. 360 tablet 1   nitroGLYCERIN  (NITROSTAT ) 0.4 MG SL tablet Place 1 tablet (0.4 mg total) under the tongue every 5 (five) minutes as needed for chest pain. 25 tablet 3   Polyethylene Glycol 400 (VISINE DRY EYE RELIEF OP) Place 1 drop into both eyes daily as needed (dry eyes).     potassium chloride  (KLOR-CON  M10) 10 MEQ tablet Take 1 tablet (10 mEq total) by mouth 2 (two) times daily. 180 tablet 0   rosuvastatin  (CRESTOR ) 20 MG tablet TAKE 1 TABLET BY MOUTH EVERY DAY 90 tablet 3   Semaglutide , 2 MG/DOSE, (OZEMPIC , 2 MG/DOSE,) 8 MG/3ML SOPN Inject 2 mg into the skin once a week. 3 mL 5   allopurinol  (ZYLOPRIM ) 100 MG tablet TAKE 1 TABLET BY MOUTH TWICE A DAY 180 tablet 1   metoprolol  tartrate (LOPRESSOR ) 25 MG tablet TAKE 0.5 TABLETS BY MOUTH 2 TIMES DAILY. 90 tablet 1   rOPINIRole  (REQUIP ) 0.5 MG tablet TAKE 1 TABLET AT BEDTIME FOR RESTLESS LEG 90 tablet 1   No facility-administered medications prior to visit.     EXAM:  BP 118/74 (BP Location: Right Arm, Patient Position: Sitting, Cuff Size: Large)   Pulse 83   Temp 97.6 F (36.4 C) (Oral)   Ht 5' 3 (1.6 m)   Wt 215 lb 9.6 oz (97.8 kg)   SpO2 98%  BMI 38.19 kg/m   Body mass index is 38.19 kg/m. Wt Readings from Last 3 Encounters:  05/18/24 215 lb 9.6 oz (97.8 kg)  04/21/24 208 lb (94.3 kg)  04/05/24 214 lb 3.2 oz (97.2 kg)    Physical Exam: Vital signs reviewed HZW:Uypd is a well-developed well-nourished alert cooperative    who appearsr stated age in no acute distress.  On o2   HEENT: normocephalic atraumatic , Eyes: PERRL EOM's full, conjunctiva clear, Nares: paten,t no deformity discharge or tenderness., Ears: no deformity EAC's clear TMs with  normal landmarks. Hearing aids  Mouth: clear OP, no lesions, edema.  Moist mucous membranes. Dentition in adequate repair. NECK: supple without masses, thyromegaly or bruits. CHEST/PULM:  Clear to auscultation and percussion breath sounds equal  distant no wheeze , rales or rhonchi. CV: S1 S2 no gallops, murmurs, rubs. Peripheral pulses are present  without delay..  ABDOMEN: Bowel sounds normal nontender  No guard or rebound, no hepato splenomegal no CVA tenderness.  No hernia. Extremtities:  No clubbing cyanosis or edema, ruddy  distal but pulse intact  no acute joint swelling or redness no focal atrophy NEURO:  Oriented x3, cranial nerves 3-12 appear to be intact, no obvious focal weakness, SKIN: No acute rashes normal turgor, color, no bruising or petechiae. PSYCH: Oriented, good eye contact, no obvious depression anxiety, cognition and judgment appear normal. LN: no cervical adenopathy  Diabetic foot exam was performed with the following findings:   No deformities, ulcerations, or other skin breakdown Normal sensation of 10g monofilament Intact posterior tibialis and dorsalis pedis pulses Ruddy appearance   right previous injury      Lab Results  Component Value Date   WBC 6.8 05/11/2024   HGB 13.6 05/11/2024   HCT 41.0 05/11/2024   PLT 242.0 05/11/2024   GLUCOSE 96 05/11/2024   CHOL 110 05/11/2024   TRIG 155.0 (H) 05/11/2024   HDL 42.60 05/11/2024   LDLDIRECT 116.0 02/05/2019   LDLCALC 37 05/11/2024   ALT 15 05/11/2024   AST 18 05/11/2024   NA 141 05/11/2024   K 4.0 05/11/2024   CL 98 05/11/2024   CREATININE 1.15 05/11/2024   BUN 25 (H) 05/11/2024   CO2 33 (H) 05/11/2024   TSH 3.95 05/11/2024   INR 1.0 11/28/2020   HGBA1C 5.8 05/11/2024    BP Readings from Last 3 Encounters:  05/18/24 118/74  04/05/24 (!) 144/77  01/29/24 130/80   Lab Results  Component Value Date   VITAMINB12 205 (L) 05/11/2024    Lab results reviewed with patient  and daughter    ASSESSMENT AND PLAN:  Discussed the following assessment and plan:    ICD-10-CM   1. Visit for preventive health examination  Z00.00     2. Family history of cancer  Z80.9 Ambulatory referral to Genetics    3. Diabetes mellitus type II, non insulin  dependent (HCC)  E11.9     4. Essential hypertension  I10     5. History of gout  Z87.39     6. Medication management  Z79.899     7. RLS (restless legs syndrome)  G25.81     8. Hyperlipidemia, unspecified hyperlipidemia type  E78.5     9. Oxygen  dependent  Z99.81     10. Low vitamin B12 level  R79.89     Strong family hx of cancer  and had opted for no testing in past but  advised may be helpful for other generations based on  hx and that .  A positive marker has been identified in family . She is also concerned at this time .  DM controlled  Gout stable  wants to remain on meds RLS  feels  med very helpful and wants to continue Add b12 supplement  Resp status as per  pulmonary continue  Return in about 6 months (around 11/16/2024).  Patient Care Team: Ayza Ripoll, Apolinar POUR, MD as PCP - General (Internal Medicine) Lavona Agent, MD as PCP - Cardiology (Cardiology) Estelle Service, MD as Attending Physician (Obstetrics and Gynecology) Abran Norleen SAILOR, MD as Attending Physician (Gastroenterology) Lavona Agent, MD as Consulting Physician (Cardiology) Jude Harden GAILS, MD as Consulting Physician (Pulmonary Disease) Jesus Oliphant, MD as Consulting Physician (Otolaryngology) Lionell Jon DEL, [facility] (Pharmacist) Abigail Maude POUR [facility]) Patient Instructions  Good to see you today .   Will do referral for  genetic counseling .   Add b12 1000 mcg or similar per day . Vit d level is good.   Get  appt with   GI team dr Abran.   ROV in 4-6 months   Kynadi Dragos K. Harlow Carrizales M.D.  "

## 2024-06-07 ENCOUNTER — Other Ambulatory Visit: Payer: Self-pay

## 2024-06-07 ENCOUNTER — Other Ambulatory Visit (HOSPITAL_COMMUNITY): Payer: Self-pay

## 2024-06-08 ENCOUNTER — Other Ambulatory Visit (HOSPITAL_COMMUNITY): Payer: Self-pay

## 2024-06-08 ENCOUNTER — Other Ambulatory Visit: Payer: Self-pay

## 2024-06-08 LAB — OPHTHALMOLOGY REPORT-SCANNED

## 2024-06-09 ENCOUNTER — Encounter (HOSPITAL_BASED_OUTPATIENT_CLINIC_OR_DEPARTMENT_OTHER): Payer: Self-pay | Admitting: Pulmonary Disease

## 2024-06-16 NOTE — Telephone Encounter (Signed)
 Can you send order to [facility] instead?

## 2024-06-22 ENCOUNTER — Encounter: Payer: Self-pay | Admitting: Cardiology

## 2024-06-24 ENCOUNTER — Inpatient Hospital Stay

## 2024-06-25 ENCOUNTER — Other Ambulatory Visit (HOSPITAL_COMMUNITY): Payer: Self-pay

## 2024-06-25 ENCOUNTER — Telehealth: Payer: Self-pay | Admitting: Pharmacy Technician

## 2024-06-25 NOTE — Telephone Encounter (Signed)
" ° °  Pharmacy Patient Advocate Encounter   Received notification from Patient Advice Request messages that prior authorization for ozempic  is required/requested.   Insurance verification completed.   The patient is insured through [employer].   Per [id number]:   Ran test claim for ozempic . For a 28 day supply and the co-pay is 496.82 . PA is not needed at this time. This test claim was processed through [facility]- copay amounts may vary at other pharmacies due to pharmacy/plan contracts, or as the patient moves through the different stages of their insurance plan.      355.00 deductible then 141.82. so after this fill, her copay should be 141.82 for 1 month "

## 2024-06-29 ENCOUNTER — Ambulatory Visit: Admitting: Internal Medicine

## 2024-07-06 ENCOUNTER — Ambulatory Visit (HOSPITAL_BASED_OUTPATIENT_CLINIC_OR_DEPARTMENT_OTHER)

## 2024-07-15 ENCOUNTER — Ambulatory Visit: Admitting: Internal Medicine

## 2024-08-05 ENCOUNTER — Inpatient Hospital Stay: Admitting: Genetic Counselor

## 2024-08-05 ENCOUNTER — Inpatient Hospital Stay

## 2024-08-09 ENCOUNTER — Ambulatory Visit: Admitting: Cardiology

## 2025-04-27 ENCOUNTER — Ambulatory Visit
